# Patient Record
Sex: Male | Born: 1935
Health system: Southern US, Community
[De-identification: ages and names within clinical notes are randomized; demographics above are authoritative.]

## PROBLEM LIST (undated history)

## (undated) DIAGNOSIS — G609 Hereditary and idiopathic neuropathy, unspecified: Secondary | ICD-10-CM

## (undated) DIAGNOSIS — I350 Nonrheumatic aortic (valve) stenosis: Secondary | ICD-10-CM

## (undated) DIAGNOSIS — G629 Polyneuropathy, unspecified: Secondary | ICD-10-CM

## (undated) DIAGNOSIS — R011 Cardiac murmur, unspecified: Secondary | ICD-10-CM

## (undated) DIAGNOSIS — G43809 Other migraine, not intractable, without status migrainosus: Secondary | ICD-10-CM

## (undated) DIAGNOSIS — E790 Hyperuricemia without signs of inflammatory arthritis and tophaceous disease: Secondary | ICD-10-CM

## (undated) DIAGNOSIS — F419 Anxiety disorder, unspecified: Secondary | ICD-10-CM

## (undated) DIAGNOSIS — K529 Noninfective gastroenteritis and colitis, unspecified: Secondary | ICD-10-CM

## (undated) DIAGNOSIS — I10 Essential (primary) hypertension: Secondary | ICD-10-CM

## (undated) DIAGNOSIS — M1990 Unspecified osteoarthritis, unspecified site: Secondary | ICD-10-CM

## (undated) DIAGNOSIS — E785 Hyperlipidemia, unspecified: Secondary | ICD-10-CM

## (undated) DIAGNOSIS — I2510 Atherosclerotic heart disease of native coronary artery without angina pectoris: Secondary | ICD-10-CM

## (undated) DIAGNOSIS — R609 Edema, unspecified: Secondary | ICD-10-CM

## (undated) DIAGNOSIS — R5381 Other malaise: Secondary | ICD-10-CM

## (undated) DIAGNOSIS — M2550 Pain in unspecified joint: Secondary | ICD-10-CM

## (undated) DIAGNOSIS — C61 Malignant neoplasm of prostate: Secondary | ICD-10-CM

## (undated) DIAGNOSIS — I872 Venous insufficiency (chronic) (peripheral): Secondary | ICD-10-CM

## (undated) DIAGNOSIS — M542 Cervicalgia: Secondary | ICD-10-CM

## (undated) DIAGNOSIS — I272 Pulmonary hypertension, unspecified: Secondary | ICD-10-CM

## (undated) DIAGNOSIS — B029 Zoster without complications: Secondary | ICD-10-CM

## (undated) DIAGNOSIS — H6120 Impacted cerumen, unspecified ear: Secondary | ICD-10-CM

## (undated) DIAGNOSIS — R5383 Other fatigue: Secondary | ICD-10-CM

## (undated) DIAGNOSIS — I6529 Occlusion and stenosis of unspecified carotid artery: Secondary | ICD-10-CM

## (undated) DIAGNOSIS — M7020 Olecranon bursitis, unspecified elbow: Secondary | ICD-10-CM

## (undated) DIAGNOSIS — G4733 Obstructive sleep apnea (adult) (pediatric): Secondary | ICD-10-CM

## (undated) DIAGNOSIS — G43909 Migraine, unspecified, not intractable, without status migrainosus: Secondary | ICD-10-CM

## (undated) HISTORY — DX: Hereditary and idiopathic neuropathy, unspecified: G60.9

## (undated) HISTORY — DX: Cervicalgia: M54.2

## (undated) HISTORY — DX: Essential (primary) hypertension: I10

## (undated) HISTORY — DX: Polyneuropathy, unspecified: G62.9

## (undated) HISTORY — DX: Unspecified osteoarthritis, unspecified site: M19.90

## (undated) HISTORY — DX: Venous insufficiency (chronic) (peripheral): I87.2

## (undated) HISTORY — DX: Nonrheumatic aortic (valve) stenosis: I35.0

## (undated) HISTORY — DX: Olecranon bursitis, unspecified elbow: M70.20

## (undated) HISTORY — DX: Hyperuricemia without signs of inflammatory arthritis and tophaceous disease: E79.0

## (undated) HISTORY — DX: Migraine, unspecified, not intractable, without status migrainosus: G43.909

## (undated) HISTORY — DX: Cardiac murmur, unspecified: R01.1

## (undated) HISTORY — PX: APPENDECTOMY: SHX54

## (undated) HISTORY — DX: Noninfective gastroenteritis and colitis, unspecified: K52.9

## (undated) HISTORY — DX: Pulmonary hypertension, unspecified: I27.20

## (undated) HISTORY — DX: Pain in unspecified joint: M25.50

## (undated) HISTORY — DX: Other malaise: R53.81

## (undated) HISTORY — DX: Other fatigue: R53.83

## (undated) HISTORY — DX: Obstructive sleep apnea (adult) (pediatric): G47.33

## (undated) HISTORY — DX: Impacted cerumen, unspecified ear: H61.20

## (undated) HISTORY — DX: Anxiety disorder, unspecified: F41.9

## (undated) HISTORY — PX: ANGIOPLASTY: SHX39

## (undated) HISTORY — DX: Atherosclerotic heart disease of native coronary artery without angina pectoris: I25.10

## (undated) HISTORY — PX: HEMORRHOID SURGERY: SHX153

## (undated) HISTORY — DX: Zoster without complications: B02.9

## (undated) HISTORY — PX: TONSILLECTOMY: SUR1361

## (undated) HISTORY — PX: LIPOMA EXCISION: SHX5283

## (undated) HISTORY — DX: Hyperlipidemia, unspecified: E78.5

## (undated) HISTORY — DX: Edema, unspecified: R60.9

## (undated) HISTORY — PX: CATARACT EXTRACTION W/ INTRAOCULAR LENS IMPLANT: SHX1309

## (undated) HISTORY — DX: Occlusion and stenosis of unspecified carotid artery: I65.29

## (undated) HISTORY — DX: Other migraine, not intractable, without status migrainosus: G43.809

---

## 1998-02-07 ENCOUNTER — Other Ambulatory Visit: Admission: RE | Admit: 1998-02-07 | Discharge: 1998-02-07 | Payer: Self-pay

## 1998-02-08 ENCOUNTER — Observation Stay (HOSPITAL_COMMUNITY): Admission: AD | Admit: 1998-02-08 | Discharge: 1998-02-09 | Payer: Self-pay | Admitting: Cardiology

## 1998-02-13 ENCOUNTER — Observation Stay (HOSPITAL_COMMUNITY): Admission: AD | Admit: 1998-02-13 | Discharge: 1998-02-14 | Payer: Self-pay | Admitting: Cardiology

## 1999-01-15 ENCOUNTER — Other Ambulatory Visit: Admission: RE | Admit: 1999-01-15 | Discharge: 1999-01-15 | Payer: Self-pay | Admitting: Urology

## 1999-01-24 ENCOUNTER — Encounter: Admission: RE | Admit: 1999-01-24 | Discharge: 1999-04-24 | Payer: Self-pay | Admitting: Urology

## 1999-04-02 ENCOUNTER — Ambulatory Visit (HOSPITAL_BASED_OUTPATIENT_CLINIC_OR_DEPARTMENT_OTHER): Admission: RE | Admit: 1999-04-02 | Discharge: 1999-04-02 | Payer: Self-pay | Admitting: Urology

## 1999-04-02 ENCOUNTER — Encounter: Payer: Self-pay | Admitting: Urology

## 1999-04-26 ENCOUNTER — Encounter: Payer: Self-pay | Admitting: Radiation Oncology

## 1999-05-14 ENCOUNTER — Encounter: Admission: RE | Admit: 1999-05-14 | Discharge: 1999-08-12 | Payer: Self-pay | Admitting: Radiation Oncology

## 1999-07-12 ENCOUNTER — Encounter (INDEPENDENT_AMBULATORY_CARE_PROVIDER_SITE_OTHER): Payer: Self-pay | Admitting: Specialist

## 1999-07-12 ENCOUNTER — Other Ambulatory Visit: Admission: RE | Admit: 1999-07-12 | Discharge: 1999-07-12 | Payer: Self-pay | Admitting: Internal Medicine

## 2001-01-14 ENCOUNTER — Encounter: Payer: Self-pay | Admitting: Internal Medicine

## 2001-01-14 ENCOUNTER — Ambulatory Visit (HOSPITAL_COMMUNITY): Admission: RE | Admit: 2001-01-14 | Discharge: 2001-01-14 | Payer: Self-pay | Admitting: Internal Medicine

## 2001-09-07 ENCOUNTER — Encounter: Payer: Self-pay | Admitting: Neurology

## 2001-09-07 ENCOUNTER — Ambulatory Visit (HOSPITAL_COMMUNITY): Admission: RE | Admit: 2001-09-07 | Discharge: 2001-09-07 | Payer: Self-pay | Admitting: Neurology

## 2002-06-15 ENCOUNTER — Encounter: Payer: Self-pay | Admitting: Neurology

## 2002-06-15 ENCOUNTER — Encounter: Admission: RE | Admit: 2002-06-15 | Discharge: 2002-06-15 | Payer: Self-pay | Admitting: Neurology

## 2004-10-30 ENCOUNTER — Ambulatory Visit: Payer: Self-pay | Admitting: *Deleted

## 2004-11-07 ENCOUNTER — Ambulatory Visit: Payer: Self-pay | Admitting: Cardiology

## 2004-11-07 ENCOUNTER — Ambulatory Visit: Payer: Self-pay | Admitting: *Deleted

## 2004-11-13 ENCOUNTER — Ambulatory Visit: Payer: Self-pay

## 2004-12-03 ENCOUNTER — Ambulatory Visit: Payer: Self-pay | Admitting: *Deleted

## 2004-12-05 ENCOUNTER — Ambulatory Visit: Payer: Self-pay | Admitting: Internal Medicine

## 2005-03-04 ENCOUNTER — Ambulatory Visit: Payer: Self-pay | Admitting: *Deleted

## 2005-06-05 ENCOUNTER — Ambulatory Visit: Payer: Self-pay | Admitting: Internal Medicine

## 2005-08-06 ENCOUNTER — Ambulatory Visit: Payer: Self-pay

## 2005-08-06 ENCOUNTER — Ambulatory Visit: Payer: Self-pay | Admitting: Internal Medicine

## 2005-08-13 ENCOUNTER — Ambulatory Visit: Payer: Self-pay | Admitting: Internal Medicine

## 2005-08-19 ENCOUNTER — Ambulatory Visit: Payer: Self-pay | Admitting: *Deleted

## 2005-08-22 ENCOUNTER — Ambulatory Visit: Payer: Self-pay

## 2005-12-02 ENCOUNTER — Ambulatory Visit: Payer: Self-pay | Admitting: Internal Medicine

## 2005-12-17 ENCOUNTER — Ambulatory Visit: Payer: Self-pay | Admitting: *Deleted

## 2006-02-27 ENCOUNTER — Ambulatory Visit: Payer: Self-pay | Admitting: Cardiology

## 2006-03-16 ENCOUNTER — Ambulatory Visit: Payer: Self-pay

## 2006-03-16 ENCOUNTER — Ambulatory Visit: Payer: Self-pay | Admitting: *Deleted

## 2006-04-20 ENCOUNTER — Ambulatory Visit: Payer: Self-pay | Admitting: *Deleted

## 2006-05-05 ENCOUNTER — Ambulatory Visit: Payer: Self-pay | Admitting: *Deleted

## 2006-05-11 ENCOUNTER — Ambulatory Visit: Payer: Self-pay | Admitting: *Deleted

## 2006-06-01 ENCOUNTER — Ambulatory Visit: Payer: Self-pay | Admitting: Internal Medicine

## 2006-07-06 ENCOUNTER — Ambulatory Visit: Payer: Self-pay | Admitting: Internal Medicine

## 2006-08-24 ENCOUNTER — Ambulatory Visit: Payer: Self-pay | Admitting: Internal Medicine

## 2006-09-08 ENCOUNTER — Ambulatory Visit: Payer: Self-pay | Admitting: *Deleted

## 2006-09-15 ENCOUNTER — Ambulatory Visit: Payer: Self-pay

## 2006-09-15 ENCOUNTER — Encounter: Payer: Self-pay | Admitting: Cardiology

## 2006-09-16 ENCOUNTER — Ambulatory Visit: Payer: Self-pay | Admitting: *Deleted

## 2006-10-19 ENCOUNTER — Ambulatory Visit: Payer: Self-pay | Admitting: Internal Medicine

## 2006-10-29 ENCOUNTER — Ambulatory Visit: Payer: Self-pay | Admitting: Internal Medicine

## 2006-10-29 ENCOUNTER — Encounter (INDEPENDENT_AMBULATORY_CARE_PROVIDER_SITE_OTHER): Payer: Self-pay | Admitting: *Deleted

## 2006-12-22 ENCOUNTER — Ambulatory Visit: Payer: Self-pay

## 2006-12-22 ENCOUNTER — Ambulatory Visit: Payer: Self-pay | Admitting: *Deleted

## 2006-12-22 LAB — CONVERTED CEMR LAB
Alkaline Phosphatase: 20 units/L — ABNORMAL LOW (ref 39–117)
BUN: 27 mg/dL — ABNORMAL HIGH (ref 6–23)
Bilirubin, Direct: 0.1 mg/dL (ref 0.0–0.3)
CO2: 25 meq/L (ref 19–32)
GFR calc Af Amer: 58 mL/min
HDL: 59.7 mg/dL (ref >39.0)
Potassium: 4.9 meq/L (ref 3.5–5.1)
Sodium: 140 meq/L (ref 135–145)
Total CHOL/HDL Ratio: 3
Total Protein: 6.2 g/dL (ref 6.0–8.3)
Triglycerides: 57 mg/dL (ref 0–149)

## 2007-01-06 ENCOUNTER — Ambulatory Visit: Payer: Self-pay | Admitting: Internal Medicine

## 2007-01-10 ENCOUNTER — Encounter: Admission: RE | Admit: 2007-01-10 | Discharge: 2007-01-10 | Payer: Self-pay | Admitting: Internal Medicine

## 2007-01-26 ENCOUNTER — Ambulatory Visit: Payer: Self-pay | Admitting: *Deleted

## 2007-02-10 DIAGNOSIS — G43809 Other migraine, not intractable, without status migrainosus: Secondary | ICD-10-CM

## 2007-02-10 DIAGNOSIS — Z9089 Acquired absence of other organs: Secondary | ICD-10-CM

## 2007-02-10 DIAGNOSIS — G622 Polyneuropathy due to other toxic agents: Secondary | ICD-10-CM

## 2007-02-10 DIAGNOSIS — R011 Cardiac murmur, unspecified: Secondary | ICD-10-CM

## 2007-02-10 DIAGNOSIS — G619 Inflammatory polyneuropathy, unspecified: Secondary | ICD-10-CM | POA: Insufficient documentation

## 2007-02-10 DIAGNOSIS — M109 Gout, unspecified: Secondary | ICD-10-CM

## 2007-02-10 DIAGNOSIS — Z9861 Coronary angioplasty status: Secondary | ICD-10-CM

## 2007-02-10 HISTORY — DX: Other migraine, not intractable, without status migrainosus: G43.809

## 2007-02-17 ENCOUNTER — Encounter: Admission: RE | Admit: 2007-02-17 | Discharge: 2007-03-17 | Payer: Self-pay | Admitting: Neurology

## 2007-03-10 ENCOUNTER — Encounter: Payer: Self-pay | Admitting: Internal Medicine

## 2007-03-15 ENCOUNTER — Ambulatory Visit: Payer: Self-pay

## 2007-03-15 ENCOUNTER — Ambulatory Visit: Payer: Self-pay | Admitting: *Deleted

## 2007-03-15 ENCOUNTER — Encounter (INDEPENDENT_AMBULATORY_CARE_PROVIDER_SITE_OTHER): Payer: Self-pay | Admitting: *Deleted

## 2007-03-15 LAB — CONVERTED CEMR LAB
AST: 21 units/L (ref 0–37)
Bilirubin, Direct: 0.1 mg/dL (ref 0.0–0.3)
CO2: 29 meq/L (ref 19–32)
Chloride: 112 meq/L (ref 96–112)
Cholesterol: 181 mg/dL (ref 0–200)
Creatinine, Ser: 1.4 mg/dL (ref 0.4–1.5)
Glucose, Bld: 93 mg/dL (ref 70–99)
HDL: 55.4 mg/dL (ref >39.0)
Potassium: 4.6 meq/L (ref 3.5–5.1)
Sodium: 146 meq/L — ABNORMAL HIGH (ref 135–145)
Total Bilirubin: 1.2 mg/dL (ref 0.3–1.2)
Total Protein: 5.8 g/dL — ABNORMAL LOW (ref 6.0–8.3)
Triglycerides: 72 mg/dL (ref 0–149)
VLDL: 14 mg/dL (ref 0–40)

## 2007-03-19 ENCOUNTER — Ambulatory Visit: Payer: Self-pay | Admitting: Cardiovascular Disease

## 2007-04-20 ENCOUNTER — Ambulatory Visit: Payer: Self-pay | Admitting: Internal Medicine

## 2007-04-20 DIAGNOSIS — I872 Venous insufficiency (chronic) (peripheral): Secondary | ICD-10-CM | POA: Insufficient documentation

## 2007-04-20 DIAGNOSIS — R609 Edema, unspecified: Secondary | ICD-10-CM

## 2007-07-27 ENCOUNTER — Ambulatory Visit: Payer: Self-pay

## 2007-08-25 ENCOUNTER — Ambulatory Visit: Payer: Self-pay

## 2007-09-14 ENCOUNTER — Ambulatory Visit: Payer: Self-pay | Admitting: Internal Medicine

## 2007-09-14 DIAGNOSIS — M7020 Olecranon bursitis, unspecified elbow: Secondary | ICD-10-CM

## 2007-09-21 ENCOUNTER — Ambulatory Visit: Payer: Self-pay | Admitting: Cardiovascular Disease

## 2007-09-21 LAB — CONVERTED CEMR LAB
ALT: 28 units/L (ref 0–53)
AST: 21 units/L (ref 0–37)
Alkaline Phosphatase: 24 units/L — ABNORMAL LOW (ref 39–117)
Bilirubin, Direct: 0.2 mg/dL (ref 0.0–0.3)
Cholesterol: 161 mg/dL (ref 0–200)
HDL: 58 mg/dL (ref >39.0)
Total Protein: 6.3 g/dL (ref 6.0–8.3)

## 2007-11-16 ENCOUNTER — Telehealth: Payer: Self-pay | Admitting: Internal Medicine

## 2007-11-17 ENCOUNTER — Telehealth (INDEPENDENT_AMBULATORY_CARE_PROVIDER_SITE_OTHER): Payer: Self-pay | Admitting: *Deleted

## 2008-03-17 ENCOUNTER — Ambulatory Visit: Payer: Self-pay | Admitting: Cardiovascular Disease

## 2008-06-16 ENCOUNTER — Emergency Department (HOSPITAL_COMMUNITY): Admission: EM | Admit: 2008-06-16 | Discharge: 2008-06-16 | Payer: Self-pay | Admitting: Family Medicine

## 2008-09-07 ENCOUNTER — Ambulatory Visit: Payer: Self-pay

## 2008-09-13 ENCOUNTER — Ambulatory Visit: Payer: Self-pay | Admitting: Cardiovascular Disease

## 2008-09-15 ENCOUNTER — Ambulatory Visit: Payer: Self-pay | Admitting: Cardiovascular Disease

## 2008-09-15 LAB — CONVERTED CEMR LAB
ALT: 22 units/L (ref 0–53)
AST: 23 units/L (ref 0–37)
Bilirubin, Direct: 0.3 mg/dL (ref 0.0–0.3)
HDL: 53 mg/dL (ref >39.0)
Total Bilirubin: 1.4 mg/dL — ABNORMAL HIGH (ref 0.3–1.2)
Total CHOL/HDL Ratio: 2.9
VLDL: 10 mg/dL (ref 0–40)

## 2008-11-15 ENCOUNTER — Ambulatory Visit: Payer: Self-pay | Admitting: Internal Medicine

## 2008-11-15 DIAGNOSIS — E785 Hyperlipidemia, unspecified: Secondary | ICD-10-CM

## 2008-11-15 DIAGNOSIS — I10 Essential (primary) hypertension: Secondary | ICD-10-CM | POA: Insufficient documentation

## 2008-11-15 DIAGNOSIS — M2550 Pain in unspecified joint: Secondary | ICD-10-CM | POA: Insufficient documentation

## 2008-11-15 DIAGNOSIS — G609 Hereditary and idiopathic neuropathy, unspecified: Secondary | ICD-10-CM | POA: Insufficient documentation

## 2008-11-15 DIAGNOSIS — Z8719 Personal history of other diseases of the digestive system: Secondary | ICD-10-CM

## 2008-11-19 LAB — CONVERTED CEMR LAB
Sed Rate: 2 mm/hr (ref 0–16)
Uric Acid, Serum: 8.4 mg/dL — ABNORMAL HIGH (ref 4.0–7.8)

## 2008-11-21 ENCOUNTER — Telehealth (INDEPENDENT_AMBULATORY_CARE_PROVIDER_SITE_OTHER): Payer: Self-pay | Admitting: *Deleted

## 2008-11-21 ENCOUNTER — Encounter (INDEPENDENT_AMBULATORY_CARE_PROVIDER_SITE_OTHER): Payer: Self-pay | Admitting: *Deleted

## 2009-01-30 ENCOUNTER — Telehealth (INDEPENDENT_AMBULATORY_CARE_PROVIDER_SITE_OTHER): Payer: Self-pay | Admitting: *Deleted

## 2009-02-26 ENCOUNTER — Encounter: Payer: Self-pay | Admitting: Internal Medicine

## 2009-03-07 DIAGNOSIS — I359 Nonrheumatic aortic valve disorder, unspecified: Secondary | ICD-10-CM

## 2009-03-07 DIAGNOSIS — I2510 Atherosclerotic heart disease of native coronary artery without angina pectoris: Secondary | ICD-10-CM

## 2009-03-07 DIAGNOSIS — I6529 Occlusion and stenosis of unspecified carotid artery: Secondary | ICD-10-CM

## 2009-03-08 ENCOUNTER — Ambulatory Visit: Payer: Self-pay | Admitting: Cardiovascular Disease

## 2009-03-12 ENCOUNTER — Inpatient Hospital Stay (HOSPITAL_COMMUNITY): Admission: EM | Admit: 2009-03-12 | Discharge: 2009-03-13 | Payer: Self-pay | Admitting: Emergency Medicine

## 2009-03-12 ENCOUNTER — Ambulatory Visit: Payer: Self-pay | Admitting: Internal Medicine

## 2009-03-19 ENCOUNTER — Telehealth: Payer: Self-pay | Admitting: Cardiovascular Disease

## 2009-04-02 ENCOUNTER — Ambulatory Visit: Payer: Self-pay

## 2009-04-02 ENCOUNTER — Encounter: Payer: Self-pay | Admitting: Cardiovascular Disease

## 2009-05-23 ENCOUNTER — Encounter: Payer: Self-pay | Admitting: Internal Medicine

## 2009-06-18 ENCOUNTER — Ambulatory Visit: Payer: Self-pay | Admitting: Internal Medicine

## 2009-06-18 DIAGNOSIS — H6120 Impacted cerumen, unspecified ear: Secondary | ICD-10-CM

## 2009-06-18 DIAGNOSIS — H9190 Unspecified hearing loss, unspecified ear: Secondary | ICD-10-CM | POA: Insufficient documentation

## 2009-08-21 ENCOUNTER — Encounter: Payer: Self-pay | Admitting: Internal Medicine

## 2009-08-23 ENCOUNTER — Telehealth: Payer: Self-pay | Admitting: Cardiovascular Disease

## 2009-09-05 ENCOUNTER — Telehealth: Payer: Self-pay | Admitting: Cardiovascular Disease

## 2009-09-07 ENCOUNTER — Encounter (INDEPENDENT_AMBULATORY_CARE_PROVIDER_SITE_OTHER): Payer: Self-pay | Admitting: *Deleted

## 2009-09-07 ENCOUNTER — Encounter: Payer: Self-pay | Admitting: Cardiovascular Disease

## 2009-09-10 ENCOUNTER — Ambulatory Visit: Payer: Self-pay

## 2009-09-10 ENCOUNTER — Encounter: Payer: Self-pay | Admitting: Internal Medicine

## 2009-09-10 ENCOUNTER — Ambulatory Visit: Payer: Self-pay | Admitting: Cardiovascular Disease

## 2009-11-09 ENCOUNTER — Ambulatory Visit: Payer: Self-pay | Admitting: Internal Medicine

## 2009-11-09 DIAGNOSIS — F411 Generalized anxiety disorder: Secondary | ICD-10-CM | POA: Insufficient documentation

## 2009-11-15 ENCOUNTER — Ambulatory Visit: Payer: Self-pay | Admitting: Internal Medicine

## 2009-11-20 LAB — CONVERTED CEMR LAB
BUN: 27 mg/dL — ABNORMAL HIGH (ref 6–23)
Creatinine, Ser: 1.3 mg/dL (ref 0.4–1.5)
Potassium: 4.4 meq/L (ref 3.5–5.1)
Sed Rate: 10 mm/hr (ref 0–22)

## 2009-11-28 ENCOUNTER — Ambulatory Visit: Payer: Self-pay | Admitting: Internal Medicine

## 2009-11-28 DIAGNOSIS — E790 Hyperuricemia without signs of inflammatory arthritis and tophaceous disease: Secondary | ICD-10-CM | POA: Insufficient documentation

## 2010-01-01 ENCOUNTER — Telehealth (INDEPENDENT_AMBULATORY_CARE_PROVIDER_SITE_OTHER): Payer: Self-pay | Admitting: *Deleted

## 2010-01-09 ENCOUNTER — Telehealth: Payer: Self-pay | Admitting: Internal Medicine

## 2010-01-17 ENCOUNTER — Encounter: Payer: Self-pay | Admitting: Internal Medicine

## 2010-03-14 ENCOUNTER — Ambulatory Visit: Payer: Self-pay | Admitting: Cardiovascular Disease

## 2010-03-20 ENCOUNTER — Telehealth (INDEPENDENT_AMBULATORY_CARE_PROVIDER_SITE_OTHER): Payer: Self-pay | Admitting: *Deleted

## 2010-03-21 ENCOUNTER — Encounter: Payer: Self-pay | Admitting: Cardiovascular Disease

## 2010-03-21 ENCOUNTER — Ambulatory Visit: Payer: Self-pay | Admitting: Cardiovascular Disease

## 2010-03-21 ENCOUNTER — Encounter (HOSPITAL_COMMUNITY): Admission: RE | Admit: 2010-03-21 | Discharge: 2010-03-21 | Payer: Self-pay | Admitting: Cardiovascular Disease

## 2010-03-21 ENCOUNTER — Ambulatory Visit: Payer: Self-pay

## 2010-03-21 DIAGNOSIS — R0989 Other specified symptoms and signs involving the circulatory and respiratory systems: Secondary | ICD-10-CM

## 2010-03-21 DIAGNOSIS — R0609 Other forms of dyspnea: Secondary | ICD-10-CM

## 2010-03-27 ENCOUNTER — Telehealth: Payer: Self-pay | Admitting: Internal Medicine

## 2010-03-28 LAB — CONVERTED CEMR LAB
AST: 22 units/L (ref 0–37)
Albumin: 4.2 g/dL (ref 3.5–5.2)
Alkaline Phosphatase: 25 units/L — ABNORMAL LOW (ref 39–117)
Cholesterol: 190 mg/dL (ref 0–200)
Total Protein: 6 g/dL (ref 6.0–8.3)
VLDL: 7.4 mg/dL (ref 0.0–40.0)

## 2010-06-04 ENCOUNTER — Ambulatory Visit: Payer: Self-pay | Admitting: Internal Medicine

## 2010-06-04 DIAGNOSIS — IMO0002 Reserved for concepts with insufficient information to code with codable children: Secondary | ICD-10-CM

## 2010-07-01 ENCOUNTER — Ambulatory Visit: Payer: Self-pay | Admitting: Cardiovascular Disease

## 2010-07-02 ENCOUNTER — Encounter: Payer: Self-pay | Admitting: Cardiovascular Disease

## 2010-07-02 LAB — CONVERTED CEMR LAB
Albumin: 3.7 g/dL (ref 3.5–5.2)
Cholesterol: 166 mg/dL (ref 0–200)
HDL: 86.2 mg/dL (ref >39.00)
LDL Cholesterol: 74 mg/dL (ref 0–99)
Total CHOL/HDL Ratio: 2
Total Protein: 5.5 g/dL — ABNORMAL LOW (ref 6.0–8.3)
Triglycerides: 31 mg/dL (ref 0.0–149.0)
VLDL: 6.2 mg/dL (ref 0.0–40.0)

## 2010-09-11 ENCOUNTER — Encounter: Payer: Self-pay | Admitting: Cardiovascular Disease

## 2010-09-12 ENCOUNTER — Ambulatory Visit: Payer: Self-pay

## 2010-09-12 ENCOUNTER — Encounter: Payer: Self-pay | Admitting: Cardiovascular Disease

## 2010-09-16 ENCOUNTER — Encounter: Payer: Self-pay | Admitting: Cardiovascular Disease

## 2010-09-16 ENCOUNTER — Ambulatory Visit: Payer: Self-pay | Admitting: Cardiovascular Disease

## 2010-09-16 DIAGNOSIS — I2789 Other specified pulmonary heart diseases: Secondary | ICD-10-CM | POA: Insufficient documentation

## 2010-10-10 ENCOUNTER — Encounter: Payer: Self-pay | Admitting: Cardiovascular Disease

## 2010-10-10 ENCOUNTER — Ambulatory Visit (HOSPITAL_COMMUNITY)
Admission: RE | Admit: 2010-10-10 | Discharge: 2010-10-10 | Payer: Self-pay | Source: Home / Self Care | Attending: Cardiovascular Disease | Admitting: Cardiovascular Disease

## 2010-10-10 ENCOUNTER — Ambulatory Visit: Admission: RE | Admit: 2010-10-10 | Discharge: 2010-10-10 | Payer: Self-pay | Source: Home / Self Care

## 2010-10-16 ENCOUNTER — Encounter: Payer: Self-pay | Admitting: Pulmonary Disease

## 2010-10-16 ENCOUNTER — Ambulatory Visit (HOSPITAL_BASED_OUTPATIENT_CLINIC_OR_DEPARTMENT_OTHER)
Admission: RE | Admit: 2010-10-16 | Discharge: 2010-10-16 | Payer: Self-pay | Source: Home / Self Care | Attending: Cardiovascular Disease | Admitting: Cardiovascular Disease

## 2010-10-22 ENCOUNTER — Ambulatory Visit (HOSPITAL_COMMUNITY)
Admission: RE | Admit: 2010-10-22 | Discharge: 2010-10-22 | Payer: Self-pay | Source: Home / Self Care | Attending: Internal Medicine | Admitting: Internal Medicine

## 2010-10-22 ENCOUNTER — Ambulatory Visit: Admission: RE | Admit: 2010-10-22 | Discharge: 2010-10-22 | Payer: Self-pay | Source: Home / Self Care

## 2010-10-22 ENCOUNTER — Encounter: Payer: Self-pay | Admitting: Internal Medicine

## 2010-11-05 NOTE — Assessment & Plan Note (Addendum)
Summary: LEFT LEG GOUT ?/RH......   Vital Signs:  Patient profile:   77 year old male Weight:      190.8 pounds Temp:     97.9 degrees F oral Pulse rate:   64 / minute Resp:     16 per minute BP sitting:   130 / 70  (left arm) Cuff size:   large  Vitals Entered By: Shonna Chock CMA (June 04, 2010 10:43 AM) CC: 1.) Left leg gout- patient went to the [place] and Colchicine was changed to allpurinolol and it is not helping  2.) Discuss supplements for Gout  3.) Look at right leg-cut that will not heal   Primary Care Provider:  Marga Melnick MD  CC:  1.) Left leg gout- patient went to the [place] and Colchicine was changed to allpurinolol and it is not helping  2.) Discuss supplements for Gout  3.) Look at right leg-cut that will not heal.  History of Present Illness: The [facility] changed Colchicine to Allopurinol & his gout has flared in L knee & L foot.Potential supplements reviewed; role of High Fructose Corn Syrup  in raising uric acid discussed. Abrasion R shin 10 days ago is slowly healing.  Current Medications (verified): 1)  Allopurinol 300 Mg Tabs (Allopurinol) .Marland Kitchen.. 1 By Mouth Once Daily 2)  Asacol 400 Mg  Tbec (Mesalamine) .... 2 Tabs Tid 3)  Metoprolol Tartrate 50 Mg  Tabs (Metoprolol Tartrate) .... 1/2 Tab Bid 4)  Hytrin 5 Mg  Caps (Terazosin Hcl) .Marland Kitchen.. 1 By Mouth Qd 5)  Lisinopril 40 Mg Tabs (Lisinopril) .... 1/2 Tab By Mouth Two Times A Day 6)  Gabapentin 300 Mg  Caps (Gabapentin) .... Take 3 Tabs 5 Times Daily 7)  Pravastatin Sodium 80 Mg Tabs (Pravastatin Sodium) .... Take One Tablet By Mouth Daily At Bedtime 8)  Furosemide 40 Mg  Tabs (Furosemide) .Marland Kitchen.. 1 By Mouth Qd 9)  Aspirin 81 Mg Tbec (Aspirin) .... Take One Tablet By Mouth Daily 10)  Aleve 220 Mg Tabs (Naproxen Sodium) .... Take 1 Tablet By Mouth Two Times A Day 11)  Spironolactone 25 Mg Tabs (Spironolactone) .Marland Kitchen.. 1  Once Daily 12)  Tramadol Hcl 50 Mg Tabs (Tramadol Hcl) .Marland Kitchen.. 1 Q 6 Hrs As Needed Pain  Allergies  (verified): No Known Drug Allergies  Physical Exam  General:  in no acute distress; alert,appropriate and cooperative throughout examination Pulses:  R and L dorsalis pedis and posterior tibial pulses are full and equal bilaterally Extremities:  trace left pedal edema and 1/2+ right pedal edema.  Tender dorsum L foot with mild erythema  Skin:  30X 12 mm abrasion R shin w/o purulence   Impression & Recommendations:  Problem # 1:  GOUT (ICD-274.9)  His updated medication list for this problem includes:    Allopurinol 300 Mg Tabs (Allopurinol) .Marland Kitchen... 1 by mouth once daily    Colcrys 0.6 Mg Tabs (Colchicine) .Marland Kitchen... 1 two times a day  Problem # 2:  ABRASION, LEG (ICD-916.0) w/o infection  Complete Medication List: 1)  Allopurinol 300 Mg Tabs (Allopurinol) .Marland Kitchen.. 1 by mouth once daily 2)  Asacol 400 Mg Tbec (Mesalamine) .... 2 tabs tid 3)  Metoprolol Tartrate 50 Mg Tabs (Metoprolol tartrate) .... 1/2 tab bid 4)  Hytrin 5 Mg Caps (Terazosin hcl) .Marland Kitchen.. 1 by mouth qd 5)  Lisinopril 40 Mg Tabs (Lisinopril) .... 1/2 tab by mouth two times a day 6)  Gabapentin 300 Mg Caps (Gabapentin) .... Take 3 tabs 5 times daily 7)  Pravastatin Sodium 80 Mg Tabs (Pravastatin sodium) .... Take one tablet by mouth daily at bedtime 8)  Furosemide 40 Mg Tabs (Furosemide) .Marland Kitchen.. 1 by mouth qd 9)  Aspirin 81 Mg Tbec (Aspirin) .... Take one tablet by mouth daily 10)  Aleve 220 Mg Tabs (Naproxen sodium) .... Take 1 tablet by mouth two times a day 11)  Spironolactone 25 Mg Tabs (Spironolactone) .Marland Kitchen.. 1  once daily 12)  Tramadol Hcl 50 Mg Tabs (Tramadol hcl) .Marland Kitchen.. 1 q 6 hrs as needed pain 13)  Colcrys 0.6 Mg Tabs (Colchicine) .Marland Kitchen.. 1 two times a day 14)  Bactroban 2 % Crea (Mupirocin calcium) .... Apply once-2x  daily if pus or redness present  Patient Instructions: 1)  Hold Pravastatin & Allopurinol  for 2 weeks while on  Colcrys . 2)  Please schedule a follow-up appointment in 2 weeks. Consume < 40 grams of High  Fructose Corn Syrup  sugar/ day. Wet to dry saline dressing two times a day to wound. Use Telfa dressing. Prescriptions: BACTROBAN 2 % CREA (MUPIROCIN CALCIUM) apply once-2X  daily if pus or redness present  #15 grams x 0   Entered and Authorized by:   Marga Melnick MD   Signed by:   Marga Melnick MD on 06/04/2010   Method used:   Print then Give to Patient   RxID:   [id number] COLCRYS 0.6 MG TABS (COLCHICINE) 1 two times a day  #30 x 0   Entered and Authorized by:   Marga Melnick MD   Signed by:   Marga Melnick MD on 06/04/2010   Method used:   Print then Give to Patient   RxID:   [id number]

## 2010-11-05 NOTE — Assessment & Plan Note (Addendum)
Summary: discuss personal issue//fd   Vital Signs:  Patient profile:   77 year old male Temp:     97.9 degrees F oral Pulse rate:   72 / minute BP sitting:   136 / 80  (left arm) Cuff size:   large  Vitals Entered By: Shonna Chock (November 09, 2009 12:05 PM) CC: Discuss Personal Issues: Consult about his wife Comments REVIEWED MED LIST, PATIENT AGREED DOSE AND INSTRUCTION CORRECT    Primary Care Provider:  Marga Melnick MD  CC:  Discuss Personal Issues: Consult about his wife.  History of Present Illness: He is here to discuss his wife's sleep disorder ; he feels she needs 10 mg of Ambien at bedtime  based on "a friend's recommendation". Options discussed : consult Dr Marylou Flesher; discuss with their daughter  or go to friend's for Ambien 10 mg Rx.  Allergies (verified): No Known Drug Allergies   Impression & Recommendations:  Problem # 1:  ANXIETY (ICD-300.00)  concerned re wife's condition  Orders: No Charge Patient Arrived (NCPA0) (NCPA0)  Complete Medication List: 1)  Colchicine 0.6 Mg Tabs (Colchicine) .Marland Kitchen.. 1 by mouth once daily 2)  Asacol 400 Mg Tbec (Mesalamine) .... 2 tabs tid 3)  Metoprolol Tartrate 50 Mg Tabs (Metoprolol tartrate) .... 1/2 tab bid 4)  Hytrin 5 Mg Caps (Terazosin hcl) .Marland Kitchen.. 1 by mouth qd 5)  Lisinopril 20 Mg Tabs (Lisinopril) .Marland Kitchen.. 1 by mouth bid 6)  Gabapentin 300 Mg Caps (Gabapentin) .... Take 3 three times a day 7)  Pravastatin Sodium 40 Mg Tabs (Pravastatin sodium) .Marland Kitchen.. 1 by mouth qd 8)  Furosemide 40 Mg Tabs (Furosemide) .Marland Kitchen.. 1 by mouth qd 9)  Aspirin 81 Mg Tbec (Aspirin) .... Take one tablet by mouth daily 10)  Aleve 220 Mg Tabs (Naproxen sodium) .... Take 1 tablet by mouth two times a day 11)  Hydrochlorothiazide 25 Mg Tabs (Hydrochlorothiazide) .... 1/2 by mouth once daily (added by va dr.)  Patient Instructions: 1)  Consider all options.

## 2010-11-05 NOTE — Consult Note (Addendum)
Summary: [facility]   Imported By: Lanelle Bal 02/04/2010 12:13:08  _____________________________________________________________________  External Attachment:    Type:   Image     Comment:   External Document

## 2010-11-05 NOTE — Progress Notes (Addendum)
Summary: Nuclear Pre-Procedure  Phone Note Outgoing Call Call back at [facility] Phone [phone]   Call placed by: Stanton Kidney, EMT-P,  March 20, 2010 2:20 PM Call placed to: Patient Action Taken: Phone Call Completed Summary of Call: Reviewed information on Myoview Information Sheet (see scanned document for further details).  Spoke with Patient.    Nuclear Med Background Indications for Stress Test: Evaluation for Ischemia, Stent Patency, PTCA Patency   History: Angioplasty, Echo, Heart Catheterization, Myocardial Perfusion Study, Stents  History Comments: '99 Heart Cath: Angioplasty/Stents: CFX, RCA '08 MPS: EF=62%, NL 6/10 Echo: EF=55-60%, mild AS  Symptoms: DOE    Nuclear Pre-Procedure Cardiac Risk Factors: Carotid Disease, Hypertension, Lipids Height (in): 67

## 2010-11-05 NOTE — Progress Notes (Addendum)
Summary: Referral Request  Phone Note Call from Patient Call back at Home Phone [phone]   Caller: Patient Summary of Call: Message left on VM: Patient would like a referral to Rheumatologist for arthritis  Dr.Hopper do you agree with patient's request for referral? Paul Mays  January 09, 2010 1:23 PM

## 2010-11-05 NOTE — Assessment & Plan Note (Addendum)
Summary: Cardiology Nuclear Study  Nuclear Med Background Indications for Stress Test: Evaluation for Ischemia, Stent Patency, PTCA Patency   History: Angioplasty, Echo, Heart Catheterization, Myocardial Perfusion Study, Stents  History Comments: '99 Heart Cath: Angioplasty/Stents: CFX, RCA '08 MPS: EF=62%, NL 6/10 Echo: EF=55-60%, mild AS  Symptoms: DOE, Fatigue    Nuclear Pre-Procedure Cardiac Risk Factors: Carotid Disease, Hypertension, Lipids Caffeine/Decaff Intake: None NPO After: 7:00 PM Lungs: clear IV 0.9% NS with Angio Cath: 20g     IV Site: (R) AC IV Started by: Stanton Kidney EMT-P Chest Size (in) 46     Height (in): 67 Weight (lb): 191 BMI: 30.02  Nuclear Med Study 1 or 2 day study:  1 day     Stress Test Type:  Eugenie Birks Reading MD:  Charlton Haws, MD     Referring MD:  M.Cooper Resting Radionuclide:  Technetium 60m Tetrofosmin     Resting Radionuclide Dose:  11 mCi  Stress Radionuclide:  Technetium 63m Tetrofosmin     Stress Radionuclide Dose:  33 mCi   Stress Protocol   Lexiscan: 0.4 mg   Stress Test Technologist:  Frederick Peers EMT-P     Nuclear Technologist:  Domenic Polite CNMT  Rest Procedure  Myocardial perfusion imaging was performed at rest 45 minutes following the intravenous administration of Myoview Technetium 68m Tetrofosmin.  Stress Procedure  The patient received IV Lexiscan 0.4 mg over 15-seconds.  Myoview injected at 30-seconds.  There were no significant changes with infusion.  Quantitative spect images were obtained after a 45 minute delay.  QPS Raw Data Images:  Normal; no motion artifact; normal heart/lung ratio. Stress Images:  NI: Uniform and normal uptake of tracer in all myocardial segments. Rest Images:  Normal homogeneous uptake in all areas of the myocardium. Subtraction (SDS):  Normal Transient Ischemic Dilatation:  1.17  (Normal <1.22)  Lung/Heart Ratio:  .3  (Normal <0.45)  Quantitative Gated Spect Images QGS EDV:  137  ml QGS ESV:  59 ml QGS EF:  57 % QGS cine images:  normal  Findings Normal nuclear study      Overall Impression  Exercise Capacity: Lexiscan BP Response: Normal blood pressure response. Clinical Symptoms: light headed ECG Impression: No significant ST segment change suggestive of ischemia. Overall Impression: Normal stress nuclear study.  Appended Document: Cardiology Nuclear Study please notify pt of normal result, thx.  Appended Document: Cardiology Nuclear Study Pt aware of results by phone.

## 2010-11-05 NOTE — Assessment & Plan Note (Addendum)
Summary: FOR ARTHITIS PAIN//PH   Vital Signs:  Patient profile:   77 year old male Weight:      196.4 pounds Temp:     98.1 degrees F oral Pulse rate:   60 / minute Resp:     15 per minute BP sitting:   158 / 80  (left arm) Cuff size:   large  Vitals Entered By: Shonna Chock (November 15, 2009 3:34 PM) CC: Arthritis, Hypertension Management Comments REVIEWED MED LIST, PATIENT AGREED DOSE AND INSTRUCTION CORRECT    Primary Care Provider:  Marga Melnick MD  CC:  Arthritis and Hypertension Management.  History of Present Illness: Arthritis in hands , knees , feet, & LS area. Rx: Aleve two times a day helps. No FH of arthritis. PMH of gout . HCTZ Rxed @ [facility] 03/2009.  Hypertension History:      He complains of peripheral edema, but denies headache, chest pain, palpitations, dyspnea with exertion, orthopnea, PND, visual symptoms, neurologic problems, syncope, and side effects from treatment.  He notes no problems with any antihypertensive medication side effects.        Positive major cardiovascular risk factors include male age 54 years old or older, hyperlipidemia, and hypertension.        Positive history for target organ damage include ASHD (either angina/prior MI/prior CABG).     Allergies (verified): No Known Drug Allergies  Review of Systems Eyes:  Denies blurring, double vision, and vision loss-both eyes. CV:  Denies leg cramps with exertion, lightheadness, and near fainting. MS:  Complains of joint pain, joint swelling, and low back pain; denies joint redness. Neuro:  Denies numbness and tingling; BPV.  Physical Exam  General:  in no acute distress; alert,appropriate and cooperative throughout examination Neck:  R > L carotid bruits Lungs:  Normal respiratory effort, chest expands symmetrically. Lungs are clear to auscultation, no crackles or wheezes. Heart:  normal rate, regular rhythm, no gallop, no rub, no JVD, no HJR, and grade 1 /6 systolic murmur.   Abdomen:   Bowel sounds positive,abdomen soft and non-tender without masses, organomegaly or hernias noted. No AAA or bruits Pulses:  R and L carotid,radial,dorsalis pedis and posterior tibial pulses are full and equal bilaterally Extremities:  trace left pedal edema and 1+ right pedal edema. Mild hand changes ; crepitus of knees . Osteoma @ R elbow. Skin:  Intact without suspicious lesions or rashes Psych:  memory intact for recent and remote, normally interactive, and good eye contact.     Impression & Recommendations:  Problem # 1:  ARTHRALGIA (ICD-719.40)  Orders: Venipuncture (67893) TLB-Uric Acid, Blood (84550-URIC) TLB-Rheumatoid Factor (RA) (81017-PZ) TLB-Sedimentation Rate (ESR) (85652-ESR) Prescription Created Electronically [phone])  Problem # 2:  HYPERTENSION, ESSENTIAL NOS (ICD-401.9)  The following medications were removed from the medication list:    Hydrochlorothiazide 25 Mg Tabs (Hydrochlorothiazide) .Marland Kitchen... 1/2 by mouth once daily (added by va dr.) His updated medication list for this problem includes:    Metoprolol Tartrate 50 Mg Tabs (Metoprolol tartrate) .Marland Kitchen... 1/2 tab bid    Hytrin 5 Mg Caps (Terazosin hcl) .Marland Kitchen... 1 by mouth qd    Lisinopril 20 Mg Tabs (Lisinopril) .Marland Kitchen... 1 by mouth bid    Furosemide 40 Mg Tabs (Furosemide) .Marland Kitchen... 1 by mouth qd    Spironolactone 25 Mg Tabs (Spironolactone) .Marland Kitchen... 1 two times a day  Orders: Venipuncture (27782) TLB-Creatinine, Blood (82565-CREA) TLB-Potassium (K+) (84132-K) TLB-BUN (Urea Nitrogen) (84520-BUN)  Problem # 3:  GOUT (ICD-274.9)  PMH of His updated medication  list for this problem includes:    Colchicine 0.6 Mg Tabs (Colchicine) .Marland Kitchen... 1 by mouth once daily  Orders: Venipuncture (16109)  Complete Medication List: 1)  Colchicine 0.6 Mg Tabs (Colchicine) .Marland Kitchen.. 1 by mouth once daily 2)  Asacol 400 Mg Tbec (Mesalamine) .... 2 tabs tid 3)  Metoprolol Tartrate 50 Mg Tabs (Metoprolol tartrate) .... 1/2 tab bid 4)  Hytrin 5 Mg Caps  (Terazosin hcl) .Marland Kitchen.. 1 by mouth qd 5)  Lisinopril 20 Mg Tabs (Lisinopril) .Marland Kitchen.. 1 by mouth bid 6)  Gabapentin 300 Mg Caps (Gabapentin) .... Take 3 three times a day 7)  Pravastatin Sodium 40 Mg Tabs (Pravastatin sodium) .Marland Kitchen.. 1 by mouth qd 8)  Furosemide 40 Mg Tabs (Furosemide) .Marland Kitchen.. 1 by mouth qd 9)  Aspirin 81 Mg Tbec (Aspirin) .... Take one tablet by mouth daily 10)  Aleve 220 Mg Tabs (Naproxen sodium) .... Take 1 tablet by mouth two times a day 11)  Spironolactone 25 Mg Tabs (Spironolactone) .Marland Kitchen.. 1 two times a day 12)  Tramadol Hcl 50 Mg Tabs (Tramadol hcl) .Marland Kitchen.. 1 q 6 hrs as needed pain  Hypertension Assessment/Plan:      The patient's hypertensive risk group is category C: Target organ damage and/or diabetes.  Today's blood pressure is 158/80.    Patient Instructions: 1)  Stop HCTZ. Increase Colchicine to two times a day X 1 week. Prescriptions: TRAMADOL HCL 50 MG TABS (TRAMADOL HCL) 1 q 6 hrs as needed pain  #30 x 1   Entered and Authorized by:   Marga Melnick MD   Signed by:   Marga Melnick MD on 11/15/2009   Method used:   Faxed to ...       [facility] [address] [phone]* (retail)       [address]       [place], [place]  [address]       Ph: [phone] or [phone]       Fax: [phone]   RxID:   [id number] SPIRONOLACTONE 25 MG TABS (SPIRONOLACTONE) 1 two times a day  #60 x 0   Entered and Authorized by:   Marga Melnick MD   Signed by:   Marga Melnick MD on 11/15/2009   Method used:   Print then Give to Patient   RxID:   [id number]

## 2010-11-05 NOTE — Progress Notes (Addendum)
Summary: Neuropathy treatment  Phone Note Call from Patient   Caller: Patient Summary of Call: Pt called and stated that he has neuropathy(Dr.Hopper is aware). He would like to go to a place in [place] where they will evaluate and treat. He states that they need a fax stating that he does have Neuropathy and they can do the treatment on him. Please send fax to- [phone]. Army Fossa CMA  March 28, 2010 2:20 PM   Follow-up for Phone Call        FAX 02/26/2009 Neurology evaluation Follow-up by: Marga Melnick MD,  March 27, 2010 4:30 PM  Additional Follow-up for Phone Call Additional follow up Details #1::        Information was faxed. Additional Follow-up by: Harold Barban,  March 28, 2010 2:28 PM

## 2010-11-05 NOTE — Assessment & Plan Note (Addendum)
Summary: Follow-up on labs/scm   Vital Signs:  Patient profile:   77 year old male Weight:      191 pounds Pulse rate:   72 / minute Resp:     15 per minute BP sitting:   104 / 60  (left arm) Cuff size:   large  Vitals Entered By: Shonna Chock (November 28, 2009 12:28 PM) CC: Follow-up visit: discuss labs  Comments REVIEWED MED LIST, PATIENT AGREED DOSE AND INSTRUCTION CORRECT    Primary Care Provider:  Marga Melnick MD  CC:  Follow-up visit: discuss labs .  History of Present Illness: Labs reviewed & goals discussed; uric acid was 9.4. He continues to have stiffness in knees, hands & feet; Aleve helps.See BP ; off HCTZ  since 02/10 . Now on Spironolactone 25 mg two times a day   Allergies (verified): No Known Drug Allergies  Review of Systems General:  Denies chills, fever, and sweats. MS:  Complains of joint pain and stiffness; denies joint redness and joint swelling.  Physical Exam  General:  Appears younger tahn age,in no acute distress; alert,appropriate and cooperative throughout examination Extremities:  No clubbing, cyanosis, edema. Minor OA DIP  changes  noted with normal full range of motion of all joints.  Crepitus @ knees Neurologic:  alert & oriented X3.     Impression & Recommendations:  Problem # 1:  HYPERTENSION, ESSENTIAL NOS (ICD-401.9) controlled His updated medication list for this problem includes:    Metoprolol Tartrate 50 Mg Tabs (Metoprolol tartrate) .Marland Kitchen... 1/2 tab bid    Hytrin 5 Mg Caps (Terazosin hcl) .Marland Kitchen... 1 by mouth qd    Lisinopril 20 Mg Tabs (Lisinopril) .Marland Kitchen... 1 by mouth bid    Furosemide 40 Mg Tabs (Furosemide) .Marland Kitchen... 1 by mouth qd    Spironolactone 25 Mg Tabs (Spironolactone) .Marland Kitchen... 1  once daily  Problem # 2:  ARTHRALGIA (ICD-719.40) Stiffness  Problem # 3:  HYPERURICEMIA (ICD-790.6)  Complete Medication List: 1)  Colchicine 0.6 Mg Tabs (Colchicine) .... 2 by mouth once daily 2)  Asacol 400 Mg Tbec (Mesalamine) .... 2 tabs  tid 3)  Metoprolol Tartrate 50 Mg Tabs (Metoprolol tartrate) .... 1/2 tab bid 4)  Hytrin 5 Mg Caps (Terazosin hcl) .Marland Kitchen.. 1 by mouth qd 5)  Lisinopril 20 Mg Tabs (Lisinopril) .Marland Kitchen.. 1 by mouth bid 6)  Gabapentin 300 Mg Caps (Gabapentin) .... Take 3 three times a day 7)  Pravastatin Sodium 40 Mg Tabs (Pravastatin sodium) .Marland Kitchen.. 1 by mouth qd 8)  Furosemide 40 Mg Tabs (Furosemide) .Marland Kitchen.. 1 by mouth qd 9)  Aspirin 81 Mg Tbec (Aspirin) .... Take one tablet by mouth daily 10)  Aleve 220 Mg Tabs (Naproxen sodium) .... Take 1 tablet by mouth two times a day 11)  Spironolactone 25 Mg Tabs (Spironolactone) .Marland Kitchen.. 1  once daily 12)  Tramadol Hcl 50 Mg Tabs (Tramadol hcl) .Marland Kitchen.. 1 q 6 hrs as needed pain  Patient Instructions: 1)  Consume LESS THAN 40 grams of sugar/ day from foods & drinks with High Fructose Corn Syrup  as #1,2 or #3 on label. 2)  Please schedule a follow-up Lab  appointment in 2 months for uric acid.

## 2010-11-05 NOTE — Miscellaneous (Addendum)
Summary: Orders Update  Clinical Lists Changes  Orders: Added new Test order of Carotid Duplex (Carotid Duplex) - Signed 

## 2010-11-05 NOTE — Progress Notes (Addendum)
Summary: Refill Meds   Phone Note Refill Request Call back at Home Phone [phone] Message from:  Patient  Refills Requested: Medication #1:  SPIRONOLACTONE 25 MG TABS 1  once daily  Method Requested: Fax to Local Pharmacy Initial call taken by: Shonna Chock,  January 01, 2010 2:44 PM    Prescriptions: SPIRONOLACTONE 25 MG TABS (SPIRONOLACTONE) 1  once daily  #90 x 1   Entered by:   Shonna Chock   Authorized by:   Marga Melnick MD   Signed by:   Shonna Chock on 01/01/2010   Method used:   Electronically to        [facility]  [phone]* (retail)       [address]       [place], [place]  [address]       Ph: [phone] or [phone]       Fax: [phone]   RxID:   [id number]

## 2010-11-05 NOTE — Assessment & Plan Note (Addendum)
Summary: f15m   Visit Type:  Follow-up Primary Provider:  Marga Melnick MD  CC:  shortness of breath.  History of Present Illness: This is a 77 year old gentleman with history of coronary artery disease status post percutaneous coronary intervention in the 67s. He underwent stenting of the left circumflex and right coronary artery. He presents today for followup evaluation.  He complains of exertional dyspnea, occurs with low-level activity. No orthopnea, PND, or edema. Denies chest pain. Has problems with neuropathy and a lot of leg pain related to that.  No cough, fever, chills, or wheezing.  Current Medications (verified): 1)  Colchicine 0.6 Mg  Tabs (Colchicine) .... 2 By Mouth Once Daily 2)  Asacol 400 Mg  Tbec (Mesalamine) .... 2 Tabs Tid 3)  Metoprolol Tartrate 50 Mg  Tabs (Metoprolol Tartrate) .... 1/2 Tab Bid 4)  Hytrin 5 Mg  Caps (Terazosin Hcl) .Marland Kitchen.. 1 By Mouth Qd 5)  Lisinopril 40 Mg Tabs (Lisinopril) .... 1/2 Tab By Mouth Two Times A Day 6)  Gabapentin 300 Mg  Caps (Gabapentin) .... Take 3 Tabs 5 Times Daily 7)  Pravastatin Sodium 40 Mg  Tabs (Pravastatin Sodium) .Marland Kitchen.. 1 By Mouth Qd 8)  Furosemide 40 Mg  Tabs (Furosemide) .Marland Kitchen.. 1 By Mouth Qd 9)  Aspirin 81 Mg Tbec (Aspirin) .... Take One Tablet By Mouth Daily 10)  Aleve 220 Mg Tabs (Naproxen Sodium) .... Take 1 Tablet By Mouth Two Times A Day 11)  Spironolactone 25 Mg Tabs (Spironolactone) .Marland Kitchen.. 1  Once Daily 12)  Tramadol Hcl 50 Mg Tabs (Tramadol Hcl) .Marland Kitchen.. 1 Q 6 Hrs As Needed Pain  Allergies: No Known Drug Allergies  Past History:  Past Surgical History: Last updated: 03/07/2009  Angioplasty with 2 stents in 1999.  Tonsillectomy Appendectomy  hemorrhoidectomy   lipoma removed  Past medical history reviewed for relevance to current acute and chronic problems.  Past Medical History: Reviewed history from 09/10/2009 and no changes required. CAD (ICD-414.00)- previous stenting back in 1999 AORTIC STENOSIS, MILD  (ICD-424.1) CAROTID STENOSIS (ICD-433.10) MURMUR (ICD-785.2) HYPERLIPIDEMIA (ICD-272.4) PERIPHERAL NEUROPATHY (ICD-356.9) HYPERTENSION, ESSENTIAL NOS (ICD-401.9) COLITIS, HX OF (ICD-V12.79) ARTHRALGIA (ICD-719.40) OLECRANON BURSITIS, RIGHT (ICD-726.33) VENOUS INSUFFICIENCY (ICD-459.81) EDEMA LEG (ICD-782.3) TONSILLECTOMY AND ADENOIDECTOMY, HX OF (ICD-V45.79) PSTPRC STATUS, PTCA (ICD-V45.82) CLUSTER HEADACHE (ICD-346.20) GOUT (ICD-274.9) POLYNEUROPATHY (ICD-357.9) history of traumatic subarachnoid hemorrhage 2010  Review of Systems       Negative except as per HPI   Vital Signs:  Patient profile:   77 year old male Height:      67 inches Weight:      192 pounds BMI:     30.18 Pulse rate:   50 / minute Resp:     14 per minute BP sitting:   123 / 65  (left arm)  Vitals Entered By: Kem Parkinson (March 14, 2010 11:49 AM)  Physical Exam  General:  Pt is alert and oriented, obese, elderly male, in no acute distress. HEENT: normal Neck: normal carotid upstrokes without bruits, JVP normal Lungs: CTA CV: RRR with 3/6 harsh systolic murmur LSB, A2 preserved. Abd: soft, NT, positive BS, no bruit, no organomegaly Ext: no clubbing, cyanosis, or edema. peripheral pulses 2+ and equal Skin: warm and dry without rash    EKG  Procedure date:  03/14/2010  Findings:      Sinus brady HR 50 bpm, T wave changes consider inferolateral ischemia.  Impression & Recommendations:  Problem # 1:  CAD (ICD-414.00) Pt is over 10 years out from PCI. He has  worsening dyspnea. Echo recently done and LVEF is normal, AS is only mild. He needs evaluation for obstructive CAD/coronary ischemia as a cause of his dyspnea. Will check a Lexiscan Myoview stress test. Continue ASA, ACE, beta-blocker.  His updated medication list for this problem includes:    Metoprolol Tartrate 50 Mg Tabs (Metoprolol tartrate) .Marland Kitchen... 1/2 tab bid    Lisinopril 40 Mg Tabs (Lisinopril) .Marland Kitchen... 1/2 tab by mouth two times a  day    Aspirin 81 Mg Tbec (Aspirin) .Marland Kitchen... Take one tablet by mouth daily  Orders: EKG w/ Interpretation (93000) Nuclear Stress Test (Nuc Stress Test)  Problem # 2:  AORTIC STENOSIS, MILD (ICD-424.1) Stable by exam. Continue clinical follow-up.  His updated medication list for this problem includes:    Metoprolol Tartrate 50 Mg Tabs (Metoprolol tartrate) .Marland Kitchen... 1/2 tab bid    Lisinopril 40 Mg Tabs (Lisinopril) .Marland Kitchen... 1/2 tab by mouth two times a day    Furosemide 40 Mg Tabs (Furosemide) .Marland Kitchen... 1 by mouth qd    Spironolactone 25 Mg Tabs (Spironolactone) .Marland Kitchen... 1  once daily  Problem # 3:  HYPERLIPIDEMIA (ICD-272.4) Lipids at goal in past but not up to date. Will recheck.  His updated medication list for this problem includes:    Pravastatin Sodium 40 Mg Tabs (Pravastatin sodium) .Marland Kitchen... 1 by mouth qd  CHOL: 153 (09/15/2008)   LDL: 90 (09/15/2008)   HDL: 53.0 (09/15/2008)   TG: 48 (09/15/2008)  Patient Instructions: 1)  Your physician recommends that you continue on your current medications as directed. Please refer to the Current Medication list given to you today. 2)  Your physician wants you to follow-up in: 6 MONTHS.   You will receive a reminder letter in the mail two months in advance. If you don't receive a letter, please call our office to schedule the follow-up appointment. 3)  Your physician has requested that you have a Lexiscan myoview.  For further information please visit [email or web address]  Please follow instruction sheet, as given. 4)  Your physician recommends that you return for a FASTING LIPID and LIVER Profile (272.0, 414.01) the sameday as Myoview.

## 2010-11-05 NOTE — Letter (Addendum)
Summary: Custom - Lipid  [facility], Main Office  [address]   [place], [place] [address]   Phone: [phone]  Fax: [phone]     July 02, 2010 MRN: [id number]   Paul Mays [address] [place], [place]  [address]   Dear Paul Mays,  We have reviewed your cholesterol results.  They are as follows:     Total Cholesterol:    166 (Desirable: less than 200)       HDL  Cholesterol:     86.20  (Desirable: greater than 40 for men and 50 for women)       LDL Cholesterol:       74  (Desirable: less than 100 for low risk and less than 70 for moderate to high risk)       Triglycerides:       31.0  (Desirable: less than 150)  Our recommendations include:  Your labs look fine.  Please continue your current medications.    Call our office at the number listed above if you have any questions.  Lowering your LDL cholesterol is important, but it is only one of a large number of "risk factors" that may indicate that you are at risk for heart disease, stroke or other complications of hardening of the arteries.  Other risk factors include:   A.  Cigarette Smoking* B.  High Blood Pressure* C.  Obesity* D.   Low HDL Cholesterol (see yours above)* E.   Diabetes Mellitus (higher risk if your is uncontrolled) F.  Family history of premature heart disease G.  Previous history of stroke or cardiovascular disease    *These are risk factors YOU HAVE CONTROL OVER.  For more information, visit .  There is now evidence that lowering the TOTAL CHOLESTEROL AND LDL CHOLESTEROL can reduce the risk of heart disease.  The American Heart Association recommends the following guidelines for the treatment of elevated cholesterol:  1.  If there is now current heart disease and less than two risk factors, TOTAL CHOLESTEROL should be less than 200 and LDL CHOLESTEROL should be less than 100. 2.  If there is current heart disease or two or more risk factors, TOTAL CHOLESTEROL  should be less than 200 and LDL CHOLESTEROL should be less than 70.  A diet low in cholesterol, saturated fat, and calories is the cornerstone of treatment for elevated cholesterol.  Cessation of smoking and exercise are also important in the management of elevated cholesterol and preventing vascular disease.  Studies have shown that 30 to 60 minutes of physical activity most days can help lower blood pressure, lower cholesterol, and keep your weight at a healthy level.  Drug therapy is used when cholesterol levels do not respond to therapeutic lifestyle changes (smoking cessation, diet, and exercise) and remains unacceptably high.  If medication is started, it is important to have you levels checked periodically to evaluate the need for further treatment options.  Thank you,    [employer] Team

## 2010-11-07 ENCOUNTER — Ambulatory Visit: Payer: Self-pay | Admitting: Internal Medicine

## 2010-11-07 NOTE — Assessment & Plan Note (Addendum)
Summary: 6 month rov   Visit Type:  Follow-up Primary Mccade Sullenberger:  Marga Melnick MD  CC:  No complaints.  History of Present Illness: This is a 77 year old gentleman with history of coronary artery disease status post percutaneous coronary intervention in the 57s. He underwent stenting of the left circumflex and right coronary artery. He presents today for followup evaluation.    He complains of dyspnea with exertion - experiences this when he walks the dog. This is unchanged. No chest pain, orthopnea, or PND. He does have leg swelling.    Current Medications (verified): 1)  Allopurinol 300 Mg Tabs (Allopurinol) .Marland Kitchen.. 1 By Mouth Once Daily 2)  Asacol 400 Mg  Tbec (Mesalamine) .... 2 Tabs Tid 3)  Metoprolol Tartrate 50 Mg  Tabs (Metoprolol Tartrate) .... 1/2 Tab Bid 4)  Hytrin 5 Mg  Caps (Terazosin Hcl) .Marland Kitchen.. 1 By Mouth Qd 5)  Lisinopril 40 Mg Tabs (Lisinopril) .... 1/2 Tab By Mouth Two Times A Day 6)  Gabapentin 300 Mg  Caps (Gabapentin) .... Take 3 Tabs 5 Times Daily 7)  Pravastatin Sodium 80 Mg Tabs (Pravastatin Sodium) .... Take One Tablet By Mouth Daily At Bedtime 8)  Furosemide 40 Mg  Tabs (Furosemide) .... As Needed 9)  Aspirin 81 Mg Tbec (Aspirin) .... Take One Tablet By Mouth Daily 10)  Aleve 220 Mg Tabs (Naproxen Sodium) .... Take 1 Tablet By Mouth Two Times A Day 11)  Spironolactone 25 Mg Tabs (Spironolactone) .Marland Kitchen.. 1  Once Daily 12)  Tramadol Hcl 50 Mg Tabs (Tramadol Hcl) .Marland Kitchen.. 1 Q 6 Hrs As Needed Pain  Allergies (verified): No Known Drug Allergies  Past History:  Past medical history reviewed for relevance to current acute and chronic problems.  Past Medical History: CAD (ICD-414.00)- previous stenting back in 1999 AORTIC STENOSIS, MILD (ICD-424.1) CAROTID STENOSIS (ICD-433.10) HYPERLIPIDEMIA (ICD-272.4) PERIPHERAL NEUROPATHY (ICD-356.9) HYPERTENSION, ESSENTIAL NOS (ICD-401.9) COLITIS, HX OF (ICD-V12.79) ARTHRALGIA (ICD-719.40) OLECRANON BURSITIS, RIGHT  (ICD-726.33) VENOUS INSUFFICIENCY (ICD-459.81) EDEMA LEG (ICD-782.3) TONSILLECTOMY AND ADENOIDECTOMY, HX OF (ICD-V45.79) PSTPRC STATUS, PTCA (ICD-V45.82) CLUSTER HEADACHE (ICD-346.20) GOUT (ICD-274.9) POLYNEUROPATHY (ICD-357.9) history of traumatic subarachnoid hemorrhage 2010  Review of Systems       Negative except as per HPI   Vital Signs:  Patient profile:   77 year old male Height:      67 inches Weight:      194 pounds BMI:     30.49 Pulse rate:   61 / minute Pulse rhythm:   regular Resp:     18 per minute BP sitting:   150 / 60  (left arm) Cuff size:   large  Vitals Entered By: Vikki Ports (September 16, 2010 1:43 PM)  Physical Exam  General:  Pt is alert and oriented, obese male in no acute distress. HEENT: normal Neck: normal carotid upstrokes without bruits, JVP normal Lungs: CTA CV: RRR with 3/6 midpeaking crescendo murmur RUSB, preserved aortic closure sounds Abd: soft, NT, positive BS, no bruit, no organomegaly Ext: 1+ bilateral pretibial edema. peripheral pulses 2+ and equal Skin: warm and dry without rash    Nuclear Study  Procedure date:  03/21/2010  Findings:      QPS  Raw Data Images:  Normal; no motion artifact; normal heart/lung ratio. Stress Images:  NI: Uniform and normal uptake of tracer in all myocardial segments. Rest Images:  Normal homogeneous uptake in all areas of the myocardium. Subtraction (SDS):  Normal Transient Ischemic Dilatation:  1.17  (Normal <1.22)  Lung/Heart Ratio:  .3  (Normal <  0.45)  Quantitative Gated Spect Images  QGS EDV:  137 ml QGS ESV:  59 ml QGS EF:  57 % QGS cine images:  normal  Findings  Normal nuclear study      Overall Impression   Exercise Capacity: Lexiscan BP Response: Normal blood pressure response. Clinical Symptoms: light headed ECG Impression: No significant ST segment change suggestive of ischemia. Overall Impression: Normal stress nuclear study.  Carotid Doppler  Procedure  date:  09/10/2009  Findings:      40-59% bilateral ICA stenosis. 1 yr followup recommended.  EKG  Procedure date:  09/16/2010  Findings:      NSR 61 bpm, within normal limits.  Echocardiogram  Procedure date:  04/02/2009  Findings:      Study Conclusions            1. Left ventricle: The cavity size was normal. There was mild        concentric and moderate asymmetric hypertrophy of the septum.        Systolic function was normal. The estimated ejection fraction was        in the range of 55% to 60%. Dynamic obstruction cannot be        excluded. Wall motion was normal; there were no regional wall        motion abnormalities.     2. Aortic valve: There was very mild stenosis. Mild to moderate        regurgitation. Valve area: 2.11cm 2(VTI). Valve area: 2.13cm 2        (Vmax).     3. Mitral valve: Mildly calcified annulus. Mild regurgitation.     4. Left atrium: The atrium was moderately to severely dilated.     5. Right atrium: The atrium was mildly dilated.     6. Pulmonary arteries: Systolic pressure was moderately increased.        PA peak pressure: 68mm Hg (S).  Impression & Recommendations:  Problem # 1:  CAD (ICD-414.00) Stable without angina. Continue current medical program.  His updated medication list for this problem includes:    Metoprolol Tartrate 50 Mg Tabs (Metoprolol tartrate) .Marland Kitchen... 1/2 tab bid    Lisinopril 40 Mg Tabs (Lisinopril) .Marland Kitchen... 1/2 tab by mouth two times a day    Aspirin 81 Mg Tbec (Aspirin) .Marland Kitchen... Take one tablet by mouth daily  Orders: EKG w/ Interpretation (93000) Echocardiogram (Echo) Sleep Disorder Referral (Sleep Disorder)  Problem # 2:  AORTIC STENOSIS, MILD (ICD-424.1) Murmur suggests mild to moderate AS. Followup echo to reassess pulmonary HTN will provide further evaluation of his AS.  His updated medication list for this problem includes:    Metoprolol Tartrate 50 Mg Tabs (Metoprolol tartrate) .Marland Kitchen... 1/2 tab bid    Lisinopril 40  Mg Tabs (Lisinopril) .Marland Kitchen... 1/2 tab by mouth two times a day    Furosemide 40 Mg Tabs (Furosemide) .Marland Kitchen... As needed    Spironolactone 25 Mg Tabs (Spironolactone) .Marland Kitchen... 1  once daily  Problem # 3:  CAROTID STENOSIS (ICD-433.10) Moderate carotid stenosis less than 60% bilateral last year.  His updated medication list for this problem includes:    Aspirin 81 Mg Tbec (Aspirin) .Marland Kitchen... Take one tablet by mouth daily  Problem # 4:  HYPERTENSION, ESSENTIAL NOS (ICD-401.9) BP has been well-controlled for the most part...he missed his lisinopril this morning.  His updated medication list for this problem includes:    Metoprolol Tartrate 50 Mg Tabs (Metoprolol tartrate) .Marland Kitchen... 1/2 tab bid    Hytrin 5  Mg Caps (Terazosin hcl) .Marland Kitchen... 1 by mouth qd    Lisinopril 40 Mg Tabs (Lisinopril) .Marland Kitchen... 1/2 tab by mouth two times a day    Furosemide 40 Mg Tabs (Furosemide) .Marland Kitchen... As needed    Aspirin 81 Mg Tbec (Aspirin) .Marland Kitchen... Take one tablet by mouth daily    Spironolactone 25 Mg Tabs (Spironolactone) .Marland Kitchen... 1  once daily  BP today: 150/60 Prior BP: 130/70 (06/04/2010)  Prior 10 Yr Risk Heart Disease: N/A (11/15/2009)  Labs Reviewed: K+: 4.4 (11/15/2009) Creat: : 1.3 (11/15/2009)   Chol: 166 (07/01/2010)   HDL: 86.20 (07/01/2010)   LDL: 74 (07/01/2010)   TG: 31.0 (07/01/2010)  Problem # 5:  HYPERLIPIDEMIA (ICD-272.4) Lipids well-controlled.  His updated medication list for this problem includes:    Pravastatin Sodium 80 Mg Tabs (Pravastatin sodium) .Marland Kitchen... Take one tablet by mouth daily at bedtime  CHOL: 166 (07/01/2010)   LDL: 74 (07/01/2010)   HDL: 86.20 (07/01/2010)   TG: 31.0 (07/01/2010)  Problem # 6:  OTHER DYSPNEA AND RESPIRATORY ABNORMALITIES (ICD-786.09) Pulmonary HTN noted at last echo. Will recheck an echo and also check a sleep study to rule out obstructive sleep apnea as an etiology.  His updated medication list for this problem includes:    Metoprolol Tartrate 50 Mg Tabs (Metoprolol tartrate)  .Marland Kitchen... 1/2 tab bid    Lisinopril 40 Mg Tabs (Lisinopril) .Marland Kitchen... 1/2 tab by mouth two times a day    Furosemide 40 Mg Tabs (Furosemide) .Marland Kitchen... As needed    Aspirin 81 Mg Tbec (Aspirin) .Marland Kitchen... Take one tablet by mouth daily    Spironolactone 25 Mg Tabs (Spironolactone) .Marland Kitchen... 1  once daily  Patient Instructions: 1)  Your physician recommends that you continue on your current medications as directed. Please refer to the Current Medication list given to you today. 2)  Your physician wants you to follow-up in:  6 MONTHS.  You will receive a reminder letter in the mail two months in advance. If you don't receive a letter, please call our office to schedule the follow-up appointment. 3)  Your physician has requested that you have an echocardiogram.  Echocardiography is a painless test that uses sound waves to create images of your heart. It provides your doctor with information about the size and shape of your heart and how well your heart's chambers and valves are working.  This procedure takes approximately one hour. There are no restrictions for this procedure. 4)  Your physician has recommended that you have a sleep study.  This test records several body functions during sleep, including:  brain activity, eye movement, oxygen and carbon dioxide blood levels, heart rate and rhythm, breathing rate and rhythm, the flow of air through your mouth and nose, snoring, body muscle movements, and chest and belly movement.

## 2010-11-08 ENCOUNTER — Encounter: Payer: Self-pay | Admitting: Internal Medicine

## 2010-11-08 ENCOUNTER — Ambulatory Visit (INDEPENDENT_AMBULATORY_CARE_PROVIDER_SITE_OTHER): Payer: Medicare Other | Admitting: Internal Medicine

## 2010-11-08 ENCOUNTER — Encounter: Payer: Self-pay | Admitting: Cardiovascular Disease

## 2010-11-08 DIAGNOSIS — G4733 Obstructive sleep apnea (adult) (pediatric): Secondary | ICD-10-CM | POA: Insufficient documentation

## 2010-11-08 DIAGNOSIS — I359 Nonrheumatic aortic valve disorder, unspecified: Secondary | ICD-10-CM

## 2010-11-08 DIAGNOSIS — R609 Edema, unspecified: Secondary | ICD-10-CM

## 2010-11-11 LAB — CONVERTED CEMR LAB
BUN: 21 mg/dL (ref 6–23)
Creatinine, Ser: 1.05 mg/dL (ref 0.40–1.50)

## 2010-11-13 NOTE — Miscellaneous (Addendum)
Summary: Orders Update  Clinical Lists Changes  Problems: Added new problem of OBSTRUCTIVE SLEEP APNEA (ICD-327.23) Orders: Added new Referral order of Sleep Disorder Referral (Sleep Disorder) - Signed

## 2010-11-13 NOTE — Assessment & Plan Note (Addendum)
Summary: swelling in both ankles///sph--first appt offered that he would    Vital Signs:  Patient profile:   77 year old male Weight:      191.8 pounds BMI:     30.15 Temp:     98.1 degrees F oral Pulse rate:   60 / minute Resp:     15 per minute BP sitting:   136 / 82  (left arm) Cuff size:   large  Vitals Entered By: Shonna Chock CMA (November 08, 2010 3:29 PM) CC: Ongoing concern: ankle swelling , CHF follow-up   Primary Care Provider:  Marga Melnick MD  CC:  Ongoing concern: ankle swelling  and CHF follow-up.  History of Present Illness:    Onset several  months  ago w/o trigger; RLE > LLE . Edema resolves overnight. The patient denies dyspnea at rest, dyspnea on exertion, orthopnea, PND, and fatigue.  The patient denies chest pain at rest, palpitations, and lightheadedness.   He has   severe Sleep Apnea as per study 10/16/2010; ECHO revealed AS. ALT & AST were WNL in 09/11.  Current Medications (verified): 1)  Allopurinol 300 Mg Tabs (Allopurinol) .Marland Kitchen.. 1 By Mouth Once Daily 2)  Asacol 400 Mg  Tbec (Mesalamine) .... 2 Tabs Tid 3)  Metoprolol Tartrate 50 Mg  Tabs (Metoprolol Tartrate) .... 1/2 Tab Bid 4)  Hytrin 5 Mg  Caps (Terazosin Hcl) .Marland Kitchen.. 1 By Mouth Qd 5)  Lisinopril 40 Mg Tabs (Lisinopril) .... 1/2 Tab By Mouth Two Times A Day 6)  Gabapentin 300 Mg  Caps (Gabapentin) .... Take 3 Tabs 5 Times Daily 7)  Pravastatin Sodium 80 Mg Tabs (Pravastatin Sodium) .... Take One Tablet By Mouth Daily At Bedtime 8)  Furosemide 40 Mg  Tabs (Furosemide) .... As Needed 9)  Aspirin 81 Mg Tbec (Aspirin) .... Take One Tablet By Mouth Daily 10)  Aleve 220 Mg Tabs (Naproxen Sodium) .... Take 1 Tablet By Mouth Two Times A Day 11)  Spironolactone 25 Mg Tabs (Spironolactone) .Marland Kitchen.. 1  Once Daily  Allergies (verified): No Known Drug Allergies  Past History:  Past Medical History: CAD (ICD-414.00)- previous stenting back in 1999 AORTIC STENOSIS, MILD (ICD-424.1) CAROTID STENOSIS  (ICD-433.10) HYPERLIPIDEMIA (ICD-272.4) PERIPHERAL NEUROPATHY (ICD-356.9) HYPERTENSION, ESSENTIAL NOS (ICD-401.9) COLITIS, HX OF (ICD-V12.79) ARTHRALGIA (ICD-719.40) OLECRANON BURSITIS, RIGHT (ICD-726.33) VENOUS INSUFFICIENCY (ICD-459.81) EDEMA LEG (ICD-782.3) TONSILLECTOMY AND ADENOIDECTOMY, HX OF (ICD-V45.79) PSTPRC STATUS, PTCA (ICD-V45.82) CLUSTER HEADACHE (ICD-346.20) GOUT (ICD-274.9) POLYNEUROPATHY (ICD-357.9) history of traumatic subarachnoid hemorrhage 2010 Sleep Apnea  Physical Exam  General:  Appears younger than age,in no acute distress; alert,appropriate and cooperative throughout examination Lungs:  Normal respiratory effort, chest expands symmetrically. Lungs are clear to auscultation, no crackles or wheezes. Heart:  normal rate, no murmur, no gallop, no rub, no JVD, no HJR, and grade 2 /6 systolic murmur  of AS.   Abdomen:  Bowel sounds positive,abdomen soft and non-tender without masses, organomegaly or hernias noted. Extremities:  1/2 + left  & R pedal edema.     Impression & Recommendations:  Problem # 1:  EDEMA LEG (ICD-782.3)  His updated medication list for this problem includes:    Furosemide 40 Mg Tabs (Furosemide) .Marland Kitchen... As needed    Spironolactone 25 Mg Tabs (Spironolactone) .Marland Kitchen... 1  once daily  Orders: Venipuncture (16109) TLB-Creatinine, Blood (82565-CREA) TLB-BUN (Urea Nitrogen) (84520-BUN)  Problem # 2:  OBSTRUCTIVE SLEEP APNEA (ICD-327.23)  Problem # 3:  AORTIC STENOSIS, MILD (ICD-424.1)  His updated medication list for this problem includes:  Metoprolol Tartrate 50 Mg Tabs (Metoprolol tartrate) .Marland Kitchen... 1/2 tab bid    Aspirin 81 Mg Tbec (Aspirin) .Marland Kitchen... Take one tablet by mouth daily  Complete Medication List: 1)  Allopurinol 300 Mg Tabs (Allopurinol) .Marland Kitchen.. 1 by mouth once daily 2)  Asacol 400 Mg Tbec (Mesalamine) .... 2 tabs tid 3)  Metoprolol Tartrate 50 Mg Tabs (Metoprolol tartrate) .... 1/2 tab bid 4)  Hytrin 5 Mg Caps (Terazosin hcl)  .Marland Kitchen.. 1 by mouth qd 5)  Lisinopril 40 Mg Tabs (Lisinopril) .... 1/2 tab by mouth two times a day 6)  Gabapentin 300 Mg Caps (Gabapentin) .... Take 3 tabs 5 times daily 7)  Pravastatin Sodium 80 Mg Tabs (Pravastatin sodium) .... Take one tablet by mouth daily at bedtime 8)  Furosemide 40 Mg Tabs (Furosemide) .... As needed 9)  Aspirin 81 Mg Tbec (Aspirin) .... Take one tablet by mouth daily 10)  Aleve 220 Mg Tabs (Naproxen sodium) .... Take 1 tablet by mouth two times a day 11)  Spironolactone 25 Mg Tabs (Spironolactone) .Marland Kitchen.. 1  once daily  Patient Instructions: 1)  Limit your Sodium (Salt) to less than 4 grams a day (slightly less than 1 teaspoon) to prevent fluid retention, swelling, or worsening or symptoms. Wear support hose.   Orders Added: 1)  Est. Patient Level III [62694] 2)  Venipuncture [85462] 3)  TLB-Creatinine, Blood [82565-CREA] 4)  TLB-BUN (Urea Nitrogen) [84520-BUN]  Appended Document: swelling in both ankles///sph--first appt offered that he would

## 2010-12-04 ENCOUNTER — Institutional Professional Consult (permissible substitution) (INDEPENDENT_AMBULATORY_CARE_PROVIDER_SITE_OTHER): Payer: Medicare Other | Admitting: Pulmonary Disease

## 2010-12-04 ENCOUNTER — Encounter: Payer: Self-pay | Admitting: Pulmonary Disease

## 2010-12-04 DIAGNOSIS — G4733 Obstructive sleep apnea (adult) (pediatric): Secondary | ICD-10-CM

## 2010-12-09 ENCOUNTER — Telehealth: Payer: Self-pay | Admitting: Cardiovascular Disease

## 2010-12-09 ENCOUNTER — Telehealth (INDEPENDENT_AMBULATORY_CARE_PROVIDER_SITE_OTHER): Payer: Self-pay | Admitting: *Deleted

## 2010-12-11 ENCOUNTER — Telehealth: Payer: Self-pay | Admitting: Cardiovascular Disease

## 2010-12-12 ENCOUNTER — Other Ambulatory Visit: Payer: Self-pay | Admitting: Cardiovascular Disease

## 2010-12-12 ENCOUNTER — Encounter: Payer: Self-pay | Admitting: Cardiovascular Disease

## 2010-12-12 ENCOUNTER — Other Ambulatory Visit (INDEPENDENT_AMBULATORY_CARE_PROVIDER_SITE_OTHER): Payer: Medicare Other

## 2010-12-12 DIAGNOSIS — I2510 Atherosclerotic heart disease of native coronary artery without angina pectoris: Secondary | ICD-10-CM

## 2010-12-12 DIAGNOSIS — E785 Hyperlipidemia, unspecified: Secondary | ICD-10-CM

## 2010-12-12 LAB — HEPATIC FUNCTION PANEL
ALT: 24 U/L (ref 0–53)
AST: 26 U/L (ref 0–37)
Albumin: 4 g/dL (ref 3.5–5.2)
Alkaline Phosphatase: 23 U/L — ABNORMAL LOW (ref 39–117)
Bilirubin, Direct: 0.2 mg/dL (ref 0.0–0.3)
Total Protein: 5.9 g/dL — ABNORMAL LOW (ref 6.0–8.3)

## 2010-12-12 LAB — LIPID PANEL
Cholesterol: 169 mg/dL (ref 0–200)
LDL Cholesterol: 66 mg/dL (ref 0–99)
Triglycerides: 33 mg/dL (ref 0.0–149.0)

## 2010-12-17 NOTE — Progress Notes (Addendum)
Summary: question re meds  Phone Note Call from Patient Call back at Home Phone [phone]   Caller: Patient Reason for Call: Talk to Nurse Summary of Call: pt has question re PRAVASTATIN SODIUM 80 MG TABS Initial call taken by: Roe Coombs,  December 09, 2010 1:35 PM  Follow-up for Phone Call        Attempted to call patient with  no answer. No VM set up. [name] Maeola Sarah RN  December 09, 2010 2:55 PM  Follow-up by: [name] Maeola Sarah RN,  December 09, 2010 2:55 PM  Additional Follow-up for Phone Call Additional follow up Details #1::        Spoke with wife - pt not available and will call back.  Per wife pt is ready to be treated with CPAP as was previously discussed.  He also saw his MD at the [place] who says he is taking too much Pravastatin and should be on a lower dose.  Wife would rather we discuss information with the pt and she will have him call us back.  Wife aware Dr Excell Seltzer not in the office this week Additional Follow-up by: Charolotte Capuchin, RN,  December 09, 2010 3:01 PM    Additional Follow-up for Phone Call Additional follow up Details #2::    pt calling back re meds. pt would like to talk to a nurse.     I spoke with the pt and made him aware that he should continue Pravastatin 80mg  daily.  The pt's cholesterol numbers were checked in Sept 2011 and his LDL was within goal.  The pt did state that he is a little "sluggish" when walking his dog and wondered if this was related to Pravastatin.  I made the pt aware that this could be coming from statin and recommended that he try CO Q10.  If this does not help his symptoms he will call us back.  The pt has also already been in touch with Pulmonary about CPAP and [facility] will be contacting him to schedule delivery of machine.  Follow-up by: Julieta Gutting, RN, BSN,  December 10, 2010 9:36 AM

## 2010-12-17 NOTE — Assessment & Plan Note (Signed)
Summary: consult for management of osa   Visit Type:  Initial Consult Copy to:  Tonny Bollman Primary Provider/Referring Provider:  Marga Melnick  CC:  Sleep Consult for abnormal sleep study.  pt states his wife c/o pt snoring while sleeping. Marland Kitchen  History of Present Illness: The pt is an 77y/o male who I have been asked to see for management of osa.  He recently underwent a split night study where he was noted to have severe osa.  He was found to have AHI 67/hr, desat trasiently to 77%, and an optimal cpap pressure of 17cm.  The pt has known loud snoring, and his wife has moved to a different bedroom in the house.  He typically sleeps from 9pm to 730am, and feels that he is rested upon arising?  He notes definite sleep pressure during the day with periods of inactivity, and some issues with driving longer distances.  His weight is up 10 pounds over the last 2 yrs.    Preventive Screening-Counseling & Management  Alcohol-Tobacco     Smoking Status: never  Current Medications (verified): 1)  Prednisone 5 Mg Tabs (Prednisone) .... Taper As Directed. 2)  Allopurinol 300 Mg Tabs (Allopurinol) .... Take 1 Tablet By Mouth Once A Day 3)  Asacol 400 Mg Tbec (Mesalamine) .... Take 2 Tabs By Mouth Two Times A Day 4)  Metoprolol Tartrate 50 Mg Tabs (Metoprolol Tartrate) .... 1/2 Tab By Mouth Two Times A Day 5)  Terazosin Hcl 10 Mg Caps (Terazosin Hcl) .... Take 1 Tab By Mouth At Bedtime 6)  Lisinopril 40 Mg Tabs (Lisinopril) .... 1/2 Tab By Mouth Two Times A Day 7)  Gabapentin 300 Mg Caps (Gabapentin) .... Take 3 Tabs By Mouth 5 Times A Day 8)  Pravastatin Sodium 80 Mg Tabs (Pravastatin Sodium) .... Take 1 Tab By Mouth At Bedtime 9)  Furosemide 40 Mg Tabs (Furosemide) .... As Needed For Swelling 10)  Aspirin Low Dose 81 Mg Tabs (Aspirin) .... Take 1 Tablet By Mouth Once A Day 11)  Aleve 220 Mg Tabs (Naproxen Sodium) .... Take 1 Tablet By Mouth Two Times A Day 12)  Spironolactone 25 Mg Tabs  (Spironolactone) .... Take 1 Tablet By Mouth Once A Day  Allergies (verified): No Known Drug Allergies  Past History:  Past Medical History:  EDEMA (ICD-782.3) VENOUS INSUFFICIENCY (ICD-459.81) OLECRANON BURSITIS, RIGHT (ICD-726.33) ARTHRALGIA (ICD-719.40) COLITIS, HX OF (ICD-V12.79) PERIPHERAL NEUROPATHY (ICD-356.9) MURMUR (ICD-785.2) CAROTID STENOSIS (ICD-433.10) AORTIC STENOSIS (ICD-424.1) CAD (ICD-414.00) HEARING LOSS, BILATERAL (ICD-389.9) IMPACTED CERUMEN (ICD-380.4) ANXIETY (ICD-300.00) HYPERURICEMIA (ICD-790.6) HYPERTENSION, PULMONARY (ICD-416.8) POLYNEUROPATHY (ICD-357.9) GOUT (ICD-274.9) OBSTRUCTIVE SLEEP APNEA (ICD-327.23)--AHI 67/hr 2012 HYPERTENSION (ICD-401.9) HYPERLIPIDEMIA (ICD-272.4)    Past Surgical History:  Angioplasty with 2 stents in 1999.  Tonsillectomy Appendectomy  hemorrhoidectomy   lipoma removed  Family History: Reviewed history and no changes required. none per pt  Social History: Reviewed history and no changes required. Patient never smoked.  pt is married and lives with wife. pt has children. pt is retired.  prev worked as a Clinical biochemist.Smoking Status:  never  Review of Systems       The patient complains of hand/feet swelling.  The patient denies shortness of breath with activity, shortness of breath at rest, productive cough, non-productive cough, coughing up blood, chest pain, irregular heartbeats, acid heartburn, indigestion, loss of appetite, weight change, abdominal pain, difficulty swallowing, sore throat, tooth/dental problems, headaches, nasal congestion/difficulty breathing through nose, sneezing, itching, ear ache, anxiety, depression, joint stiffness or pain, rash, change in color  of mucus, and fever.    Vital Signs:  Patient profile:   77 year old male Height:      67 inches Weight:      196 pounds BMI:     30.81 O2 Sat:      96 % on Room air Pulse rate:   61 / minute BP sitting:   146 / 62  (right  arm) Cuff size:   regular  Vitals Entered By: Arman Filter LPN (December 04, 2010 2:20 PM)  O2 Flow:  Room air CC: Sleep Consult for abnormal sleep study.  pt states his wife c/o pt snoring while sleeping.  Comments Medications reviewed with patient Arman Filter LPN  December 04, 2010 2:29 PM    Physical Exam  General:  ow male in nad Eyes:  PERRLA and EOMI.   Nose:  mild septal deviation to the left, but patent bilat. Mouth:  mild elongation of uvula, normal palate. Neck:  no jvd, tmg, LN Lungs:  totally clear to auscultation Heart:  rrr, 3/6 sem Abdomen:  soft and nontender, bs+ Extremities:  no significant edema, pulses intact distally no cyanosis  Neurologic:  awake, a little sleepy, moves all 4    Impression & Recommendations:  Problem # 1:  OBSTRUCTIVE SLEEP APNEA (ICD-327.23)  the pt has severe osa that is best treated with cpap while trying to work on weight loss.  The pt is very hesitant to do this...he feels he will not be able to tolerate the mask, and wants to primarily work on weight reduction.  I have had a long discussion with him about osa, including my concerns about the severity of his disease, and the need to treat more aggressively.  I have reviewed the health and cardiac impact of untreated and severe disease with him.  He is going to think about trying cpap, and will get back with me.    Medications Added to Medication List This Visit: 1)  Prednisone 5 Mg Tabs (Prednisone) .... Taper as directed. 2)  Allopurinol 300 Mg Tabs (Allopurinol) .... Take 1 tablet by mouth once a day 3)  Asacol 400 Mg Tbec (Mesalamine) .... Take 2 tabs by mouth two times a day 4)  Metoprolol Tartrate 50 Mg Tabs (Metoprolol tartrate) .... 1/2 tab by mouth two times a day 5)  Terazosin Hcl 10 Mg Caps (Terazosin hcl) .... Take 1 tab by mouth at bedtime 6)  Lisinopril 40 Mg Tabs (Lisinopril) .... 1/2 tab by mouth two times a day 7)  Gabapentin 300 Mg Caps (Gabapentin) .... Take 3  tabs by mouth 5 times a day 8)  Pravastatin Sodium 80 Mg Tabs (Pravastatin sodium) .... Take 1 tab by mouth at bedtime 9)  Furosemide 40 Mg Tabs (Furosemide) .... As needed for swelling 10)  Aspirin Low Dose 81 Mg Tabs (Aspirin) .... Take 1 tablet by mouth once a day 11)  Aleve 220 Mg Tabs (Naproxen sodium) .... Take 1 tablet by mouth two times a day 12)  Spironolactone 25 Mg Tabs (Spironolactone) .... Take 1 tablet by mouth once a day  Other Orders: Consultation Level IV (62130)  Patient Instructions: 1)  think about trying cpap while working on weight loss 2)  please call me with your decision.

## 2010-12-17 NOTE — Progress Notes (Signed)
Summary: cpap [id number] pt has been called  Phone Note Call from Patient Call back at Home Phone [phone]   Caller: Patient Call For: clance Summary of Call: Pt states he wants to start on cpap machine. Initial call taken by: Darletta Moll,  December 09, 2010 12:16 PM  Follow-up for Phone Call        [facility]  Aundra Millet Reynolds LPN  December 08, 1476 3:25 PM   Additional Follow-up for Phone Call Additional follow up Details #1::        order sent to [facility].  let pt know.  I need to see him in 6 weeks.  go ahead and make appt let him know to call me if tolerance issues. Additional Follow-up by: Barbaraann Share MD,  December 10, 2010 6:40 AM    Additional Follow-up for Phone Call Additional follow up Details #2::    Spoke with pt this am, referral to be sent to [facility], 6 weeks appt sched, pt has been informed of Dr Shelle Iron recommendations. Kandice Hams CMA  December 10, 2010 9:13 AM  Follow-up by: Kandice Hams CMA,  December 10, 2010 9:13 AM

## 2010-12-17 NOTE — Progress Notes (Addendum)
Summary: Discuss Pravastatin  Phone Note Call from Patient Call back at Home Phone [phone]   Caller: Patient Summary of Call: Pt calling regarding some medication needing to be changed Initial call taken by: Judie Grieve,  December 11, 2010 3:14 PM  Follow-up for Phone Call        Left message with wife for patient to call back. Follow-up by: Dessie Coma  LPN,  December 11, 2010 3:24 PM  Additional Follow-up for Phone Call Additional follow up Details #1::        I spoke with the pt and he said he got a letter from the MD at the [facility] and it states HDL is 129 and LDL is in the 40s.  MD at [facility] instructed pt to decrease Pravastatin to 40mg  daily.  I told the pt that he can either [facility] or can have his lipids rechecked in our office. The pt would like to have his lipids rechecked in our office so that Dr Excell Seltzer can review his results. Lab appt scheduled.  Additional Follow-up by: Julieta Gutting, RN, BSN,  December 11, 2010 3:49 PM     Appended Document: Discuss Pravastatin reviewed labs - we did lipids in 2011 and HDL was in 80's, LDL in 70's. I'm fine for him to reduce pravastatin to 40 mg.  Appended Document: Discuss Pravastatin Pt aware of 12/12/10 lab results and instructed to decrease Pravastatin dose.

## 2010-12-23 ENCOUNTER — Telehealth: Payer: Self-pay | Admitting: Pulmonary Disease

## 2010-12-26 ENCOUNTER — Encounter: Payer: Self-pay | Admitting: Internal Medicine

## 2010-12-27 ENCOUNTER — Ambulatory Visit (INDEPENDENT_AMBULATORY_CARE_PROVIDER_SITE_OTHER): Payer: Medicare Other | Admitting: Internal Medicine

## 2010-12-27 ENCOUNTER — Encounter: Payer: Self-pay | Admitting: Internal Medicine

## 2010-12-27 VITALS — BP 120/70 | HR 56 | Temp 98.1°F | Ht 66.0 in | Wt 188.8 lb

## 2010-12-27 DIAGNOSIS — R5383 Other fatigue: Secondary | ICD-10-CM

## 2010-12-27 DIAGNOSIS — R5381 Other malaise: Secondary | ICD-10-CM

## 2010-12-27 DIAGNOSIS — R002 Palpitations: Secondary | ICD-10-CM

## 2010-12-27 LAB — CBC WITH DIFFERENTIAL/PLATELET
Basophils Absolute: 0 10*3/uL (ref 0.0–0.1)
Eosinophils Absolute: 0.1 10*3/uL (ref 0.0–0.7)
Hemoglobin: 12.4 g/dL — ABNORMAL LOW (ref 13.0–17.0)
Lymphocytes Relative: 17 % (ref 12.0–46.0)
MCHC: 34.2 g/dL (ref 30.0–36.0)
Neutro Abs: 2.8 10*3/uL (ref 1.4–7.7)
Platelets: 114 10*3/uL — ABNORMAL LOW (ref 150.0–400.0)
RDW: 14.9 % — ABNORMAL HIGH (ref 11.5–14.6)

## 2010-12-27 LAB — CALCIUM: Calcium: 9.3 mg/dL (ref 8.4–10.5)

## 2010-12-27 LAB — POTASSIUM: Potassium: 5.5 mEq/L — ABNORMAL HIGH (ref 3.5–5.1)

## 2010-12-27 NOTE — Patient Instructions (Signed)
Please avoid excess stimulants such as caffeine and decongestants as we discussed. If the palpitations for persist or progress once you've started CPAP then a 24 hour  heart monitor will be completed.

## 2010-12-27 NOTE — Progress Notes (Signed)
  Subjective:    Patient ID: Paul Mays, male    DOB: May 12, 1928, 77 y.o.   MRN: [id number]  HPI  Mr.Mastrangelo  is here for  Palpitations and fatigue.   He has daily palpitations the less several weeks which will last less than a few seconds. These can occur at rest    he's had fatigue for at least one month. Significantly he has sleep apnea and will start using the CPAP tonight    he was seen at the [facility] on March 5. A lipid profile revealed an LDL or bad cholesterol 49. His HDL or good cholesterol was 124. His pravastatin was decreased from 80 mg daily to 40.    Review of Systems.  He denies blurred vision, double vision, or loss of vision.   He denies fever , chills, or unexplained weight loss. He has lost 10 pounds purposefully.   He denies any lymphadenopathy or rash. He does bruise easily.   He denies abdominal pain, rectal bleeding, or melena    there is been no change in his hair, skin, or nails.   He denies temperature intolerance ; he has had no tremor.         Objective:   Physical Exam  On exam he appears younger than his stated age; he is in no acute distress.   extraocular motions intact without nystagmus. He has no scleral icterus.   He has no lymphadenopathy about the head neck or axilla.   heart rhythm is regular with a grade 1 systolic murmur at the left sternal border.   chest is clear to auscultation with no increased work of breathing, rales, or rhonchi.    thyroid is normal to palpation with no nodules or abnormal enlargement.   deep tendon reflexes are normal. No tremor is present. He does exhibit bruising in a scattered distribution; there is no jaundice.        Assessment & Plan:   #1 palpitations    #2 fatigue ; this is in the context of sleep apnea which is as yet untreated    #3 dyslipidemia well-controlled    Plan : labs will be collected to assess the palpitations and fatigue. The fatigue may be due to the sleep apnea which has not been  treated as yet.    if results are normal no palpitations persist once the CPAP is initiated that a 24-hour Holter monitor would be completed.

## 2011-01-02 NOTE — Progress Notes (Signed)
Summary: still waiting on cpap  Phone Note Call from Patient Call back at Home Phone [phone]   Caller: Patient Call For: clance Summary of Call: pt has been waiting since 3/5 re: having cpap set up. frustrated. he calle [facility] and was told that "they" were waiting to hear from [facility] nurse with "more info".  Initial call taken by: Tivis Ringer, CNA,  December 23, 2010 2:49 PM  Follow-up for Phone Call        Will forward to [facility]. Follow-up by: Vernie Murders,  December 23, 2010 4:13 PM  Additional Follow-up for Phone Call Additional follow up Details #1::        Lecretia p/u up order and will have [facility] contact pt today to set up appt. for cpap. Rhonda Cobb  December 24, 2010 9:16 AM

## 2011-01-09 ENCOUNTER — Other Ambulatory Visit (INDEPENDENT_AMBULATORY_CARE_PROVIDER_SITE_OTHER): Payer: Medicare Other

## 2011-01-09 DIAGNOSIS — D649 Anemia, unspecified: Secondary | ICD-10-CM

## 2011-01-09 DIAGNOSIS — I10 Essential (primary) hypertension: Secondary | ICD-10-CM

## 2011-01-09 DIAGNOSIS — Z79899 Other long term (current) drug therapy: Secondary | ICD-10-CM

## 2011-01-09 LAB — BASIC METABOLIC PANEL
CO2: 26 mEq/L (ref 19–32)
Calcium: 9.1 mg/dL (ref 8.4–10.5)
Chloride: 108 mEq/L (ref 96–112)
Glucose, Bld: 124 mg/dL — ABNORMAL HIGH (ref 70–99)
Potassium: 5.5 mEq/L — ABNORMAL HIGH (ref 3.5–5.1)
Sodium: 142 mEq/L (ref 135–145)

## 2011-01-09 LAB — VITAMIN B12: Vitamin B-12: 386 pg/mL (ref 211–911)

## 2011-01-13 LAB — DIFFERENTIAL
Basophils Relative: 1 % (ref 0–1)
Eosinophils Absolute: 0.1 10*3/uL (ref 0.0–0.7)
Eosinophils Absolute: 0.1 10*3/uL (ref 0.0–0.7)
Eosinophils Relative: 1 % (ref 0–5)
Eosinophils Relative: 2 % (ref 0–5)
Lymphocytes Relative: 18 % (ref 12–46)
Lymphs Abs: 0.9 10*3/uL (ref 0.7–4.0)
Lymphs Abs: 1.6 10*3/uL (ref 0.7–4.0)
Monocytes Absolute: 0.7 10*3/uL (ref 0.1–1.0)
Monocytes Relative: 12 % (ref 3–12)
Monocytes Relative: 14 % — ABNORMAL HIGH (ref 3–12)
Neutrophils Relative %: 53 % (ref 43–77)
Neutrophils Relative %: 68 % (ref 43–77)

## 2011-01-13 LAB — CBC
HCT: 40.9 % (ref 39.0–52.0)
Hemoglobin: 14 g/dL (ref 13.0–17.0)
MCHC: 34.9 g/dL (ref 30.0–36.0)
MCV: 97.1 fL (ref 78.0–100.0)
MCV: 97.2 fL (ref 78.0–100.0)
RBC: 3.88 MIL/uL — ABNORMAL LOW (ref 4.22–5.81)
RBC: 4.22 MIL/uL (ref 4.22–5.81)
RDW: 14 % (ref 11.5–15.5)
WBC: 5.4 10*3/uL (ref 4.0–10.5)

## 2011-01-13 LAB — POCT CARDIAC MARKERS
CKMB, poc: 1.6 ng/mL (ref 1.0–8.0)
CKMB, poc: 1.9 ng/mL (ref 1.0–8.0)
Myoglobin, poc: 106 ng/mL (ref 12–200)
Myoglobin, poc: 83.6 ng/mL (ref 12–200)
Troponin i, poc: <0.05 ng/mL (ref 0.00–0.09)

## 2011-01-13 LAB — COMPREHENSIVE METABOLIC PANEL
ALT: 21 U/L (ref 0–53)
ALT: 25 U/L (ref 0–53)
AST: 28 U/L (ref 0–37)
AST: 31 U/L (ref 0–37)
Albumin: 3.8 g/dL (ref 3.5–5.2)
Alkaline Phosphatase: 24 U/L — ABNORMAL LOW (ref 39–117)
CO2: 22 mEq/L (ref 19–32)
Calcium: 8.6 mg/dL (ref 8.4–10.5)
Calcium: 8.8 mg/dL (ref 8.4–10.5)
Creatinine, Ser: 0.99 mg/dL (ref 0.4–1.5)
GFR calc Af Amer: >60 mL/min (ref >60)
GFR calc Af Amer: >60 mL/min (ref >60)
GFR calc non Af Amer: >60 mL/min (ref >60)
Glucose, Bld: 93 mg/dL (ref 70–99)
Potassium: 4.3 mEq/L (ref 3.5–5.1)
Sodium: 141 mEq/L (ref 135–145)
Sodium: 141 mEq/L (ref 135–145)
Total Protein: 5.8 g/dL — ABNORMAL LOW (ref 6.0–8.3)
Total Protein: 6.2 g/dL (ref 6.0–8.3)

## 2011-01-13 LAB — ETHANOL: Alcohol, Ethyl (B): 225 mg/dL — ABNORMAL HIGH (ref 0–10)

## 2011-01-21 ENCOUNTER — Encounter: Payer: Self-pay | Admitting: Pulmonary Disease

## 2011-01-22 ENCOUNTER — Ambulatory Visit (INDEPENDENT_AMBULATORY_CARE_PROVIDER_SITE_OTHER): Payer: Medicare Other | Admitting: Pulmonary Disease

## 2011-01-22 ENCOUNTER — Encounter: Payer: Self-pay | Admitting: Pulmonary Disease

## 2011-01-22 VITALS — BP 112/60 | HR 54 | Temp 97.9°F | Ht 67.0 in | Wt 188.8 lb

## 2011-01-22 DIAGNOSIS — G4733 Obstructive sleep apnea (adult) (pediatric): Secondary | ICD-10-CM

## 2011-01-22 NOTE — Progress Notes (Signed)
  Subjective:    Patient ID: Paul Mays, male    DOB: 12-May-1928, 77 y.o.   MRN: [id number]  HPI The pt comes in today for f/u of his known severe osa.  He has been started on cpap, and has been wearing fairly compliantly the first 4 weeks.  He has no issues with mask fit or pressure, but has not seen a great difference in his sleep or daytime alertness.   I have reminded him we have yet to get him to his optimal pressure.    Review of Systems  Constitutional: Negative for fever and unexpected weight change.  HENT: Positive for rhinorrhea. Negative for ear pain, nosebleeds, congestion, sore throat, sneezing, trouble swallowing, dental problem, postnasal drip and sinus pressure.   Eyes: Negative for redness and itching.  Respiratory: Negative for cough, chest tightness, shortness of breath and wheezing.   Cardiovascular: Negative for palpitations and leg swelling.  Gastrointestinal: Negative for nausea and vomiting.  Genitourinary: Negative for dysuria.  Musculoskeletal: Negative for joint swelling.  Skin: Negative for rash.  Neurological: Negative for headaches.  Hematological: Bruises/bleeds easily.  Psychiatric/Behavioral: Negative for dysphoric mood. The patient is not nervous/anxious.        Objective:   Physical Exam Wd male in nad No skin breakdown or pressure necrosis from the cpap mask LE without edema, on cyanosis Appears alert, not sleepy, moves all 4        Assessment & Plan:

## 2011-01-22 NOTE — Assessment & Plan Note (Signed)
The pt is wearing cpap without any significant issues.  At this point, will need to get his pressure to the optimized level.  Will gradually increase to that point, and suspect he will see a difference in his sleep and alertness when we get there.

## 2011-01-22 NOTE — Patient Instructions (Signed)
Will have your pressure gradually increased toward your goal pressure of 17. Please call if having issues with tolerance followup with me in 6mos

## 2011-01-25 ENCOUNTER — Encounter: Payer: Self-pay | Admitting: Internal Medicine

## 2011-01-27 ENCOUNTER — Encounter: Payer: Self-pay | Admitting: Internal Medicine

## 2011-01-27 ENCOUNTER — Ambulatory Visit (INDEPENDENT_AMBULATORY_CARE_PROVIDER_SITE_OTHER): Payer: Medicare Other | Admitting: Internal Medicine

## 2011-01-27 DIAGNOSIS — R350 Frequency of micturition: Secondary | ICD-10-CM

## 2011-01-27 DIAGNOSIS — J309 Allergic rhinitis, unspecified: Secondary | ICD-10-CM

## 2011-01-27 DIAGNOSIS — R7309 Other abnormal glucose: Secondary | ICD-10-CM

## 2011-01-27 DIAGNOSIS — D649 Anemia, unspecified: Secondary | ICD-10-CM

## 2011-01-27 LAB — POCT URINALYSIS DIPSTICK
Bilirubin, UA: NEGATIVE
Glucose, UA: NEGATIVE
Leukocytes, UA: NEGATIVE
Protein, UA: NEGATIVE
Spec Grav, UA: 1.01

## 2011-01-27 NOTE — Patient Instructions (Addendum)
Please complete stool cards. Drink to thirst each day, up to 40 ounces of water daily to prevent dehydration.   Use the Claritin 10 mg (samples) daily as needed for allergy symptoms.

## 2011-01-27 NOTE — Progress Notes (Signed)
Subjective:    Patient ID: Paul Mays, male    DOB: [birth year], 77 y.o.   MRN: [id number]  HPI His blood counts reveal a mild anemia with hematocrit 36.2. His MCV was 103.4 suggesting possible B12 or folate deficiency;however, both folate and B12 levels were normal.He has a past medical history of colitis; has been followed by Dr. Marina Goodell. He denies a history of colon polyps. He believes he is due for followup colonoscopy.    He denies epistaxis, hemoptysis, melena, rectal bleeding, or hematuria. He also denies significant   dyspepsia , but  he bruises easily ; he is on ASA 81 mg daily.                                                                    Chemistries  reveal mild dehydration with a BUN of 35. His GFR was minimally reduced at 56.79. His glucose was 124. He describes nocturia & frequency during the day w/o polydipsia or polyphagia.Both his father & brother had DM.   Review of Systems   He recently has had extrinsic symptoms with sneezing. He has not taken any medication for this.  He denies wheezing or cough.     Objective:   Physical Exam  Gen.: Healthy and well-nourished in appearance. Alert, appropriate and cooperative throughout exam. Appears younger than age  Eyes: No corneal or conjunctival inflammation noted. Pupils equal round reactive to light and accommodation.  Extraocular motion intact. Vision grossly normal. Ears: External  ear exam reveals no significant lesions or deformities.L  Canal clear; wax on R .Nose: External nasal exam reveals no deformity or inflammation. Nasal mucosa are pink and moist. No lesions or exudates noted. Septum w/o deviation  Mouth: Oral mucosa and oropharynx reveal no lesions or exudates. Teeth in good repair. Neck: No deformities, masses, or tenderness noted. Lungs: Normal respiratory effort; chest expands symmetrically. Lungs are clear to auscultation without rales, wheezes, or increased work of breathing. Heart: Normal rate and rhythm.  Normal S1 and S2. No gallop, click, or rub. Grade 2-2.5 systolic murmur murmur.   No clubbing, cyanosis, edema(support hose in place).Nail health  good. Vascular: Carotid, radial artery,  and dorsalis posterior tibial pulses are full and equal. No bruits present. DPP decreased  Neurologic: Alert and oriented x3. Skin: scattered bruising over arms Lymph: No cervical, axillary, or inguinal lymphadenopathy present. Psych: Mood and affect are normal. Normally interactive                                                                                         Assessment & Plan:  #1 anemia, mild with mildly increased MCV but normal B12 and folate levels  #2 colitis, past medical history of   #3 extrinsic rhinoconjunctivitis with sneezing. Exam is essentially negative.  #4 frequency and nocturia  #5 hyperglycemia  Plan: Fecal occult blood cards, iron-binding capacity, repeat CBC and differential, A1c, clean-catch  urinalysis.  Samples of Claritin 10 mg daily as needed.

## 2011-01-27 NOTE — Progress Notes (Signed)
Addended by: Floydene Flock on: 01/27/2011 05:09 PM   Modules accepted: Orders

## 2011-01-28 LAB — CBC WITH DIFFERENTIAL/PLATELET
Basophils Relative: 3.7 % — ABNORMAL HIGH (ref 0.0–3.0)
Eosinophils Absolute: 0.1 10*3/uL (ref 0.0–0.7)
Eosinophils Relative: 2.1 % (ref 0.0–5.0)
HCT: 35.3 % — ABNORMAL LOW (ref 39.0–52.0)
Lymphs Abs: 1.7 10*3/uL (ref 0.7–4.0)
MCHC: 34.5 g/dL (ref 30.0–36.0)
MCV: 104 fl — ABNORMAL HIGH (ref 78.0–100.0)
Monocytes Absolute: 2.4 10*3/uL — ABNORMAL HIGH (ref 0.1–1.0)
Neutro Abs: 1 10*3/uL — ABNORMAL LOW (ref 1.4–7.7)
RBC: 3.4 Mil/uL — ABNORMAL LOW (ref 4.22–5.81)
WBC: 5.4 10*3/uL (ref 4.5–10.5)

## 2011-02-06 ENCOUNTER — Other Ambulatory Visit (INDEPENDENT_AMBULATORY_CARE_PROVIDER_SITE_OTHER): Payer: Medicare Other

## 2011-02-06 DIAGNOSIS — Z1211 Encounter for screening for malignant neoplasm of colon: Secondary | ICD-10-CM

## 2011-02-06 LAB — HEMOCCULT GUIAC POC 1CARD (OFFICE)
Card #2 Fecal Occult Blod, POC: NEGATIVE
Fecal Occult Blood, POC: NEGATIVE

## 2011-02-18 NOTE — Assessment & Plan Note (Signed)
[facility]                            CARDIOLOGY OFFICE NOTE   Kelsen, Celona AIDAN MOTEN                       MRN:          [id number]  DATE:09/13/2008                            DOB:          Jan 31, 1928    REASON FOR VISIT:  CAD.   HISTORY OF PRESENT ILLNESS:  Mr. Marcy is a 77 year old gentleman with  coronary artery disease and previous stenting back in 1999.  He has done  very well ever since his angioplasty procedure.  He denies any chest  pain, dyspnea, edema, orthopnea, PND, or fatigue.  He really feels well  and continues with an active lifestyle.  He has no complaints at  present.   MEDICATIONS:  1. Gabapentin 300 mg 2-4 daily.  2. Terazosin 5 mg at bedtime.  3. Colchicine 0.6 mg at bedtime.  4. Asacol 400 mg b.i.d. to t.i.d.  5. Metoprolol 25 mg b.i.d.  6. Aspirin 81 mg daily.  7. Lisinopril 20 mg twice daily.  8. Pravastatin 40 mg at bedtime.   ALLERGIES:  NKDA.   PHYSICAL EXAMINATION:  GENERAL:  The patient is alert and oriented in no  acute distress.  VITAL SIGNS:  Weight 183 pounds, blood pressure 150/74, heart rate 66,  and respiratory rate 12.  HEENT:  Normal.  NECK:  JVP normal.  Normal carotid upstrokes with a right carotid bruit.  LUNGS:  Clear bilaterally.  HEART:  Regular rate and rhythm with a 3/6 systolic ejection murmur  along the right upper sternal border.  A2 component is preserved.  ABDOMEN:  Soft, nontender, and no organomegaly.  EXTREMITIES:  No clubbing, cyanosis, or edema.  Peripheral pulses are  intact and equal.   ASSESSMENT:  1. Coronary artery disease.  The patient has no angina.  EKG in the      office today showed normal sinus rhythm with first-degree      atrioventricular block and a nonspecific T-wave abnormality.  It is      unchanged and there are no suggestions of ischemia.  Continue      current medical therapy with aspirin, statin, and beta-blocker.      Followup in 6 months.  2. Asymptomatic  carotid stenosis.  The patient has mild-to-moderate      bilateral carotid stenosis in the range of 40-59%.  Followup      carotid ultrasound was done on September 07, 2008 and showed stable      mild plaque bilaterally.  We will follow up in 1 year.  3. Mild aortic stenosis.  This was demonstrated on echocardiogram 1      year ago and his physical exam is compatible with this.  Continue      clinical followup.  4. Hypertension.  Blood pressure elevated today.  Past readings have      all been in the normal range.  He rushed over from the gym.  We      will follow this along as an outpatient.   For followup, I will see him back in 6 months.     Veverly Fells. Excell Seltzer, MD  Electronically Signed    MDC/MedQ  DD: 09/13/2008  DT: 09/14/2008  Job #: [id number]   cc:   Titus Dubin. Alwyn Ren, MD,FACP,FCCP

## 2011-02-18 NOTE — Assessment & Plan Note (Signed)
[name]                            CARDIOLOGY OFFICE NOTE   NAME:Paul Mays, Paul Mays                       MRN:          [id number]  DATE:09/21/2007                            DOB:          10-Jul-1928    Paul Mays was seen in followup at the [facility] Cardiology  office on September 21, 2007.  Paul Mays is a very nice 77 year old  gentleman with coronary artery disease and previous PCI of the left  circumflex and right coronary arteries.  He also has hypertension and  hyperlipidemia.  He continues to do very well from a symptomatic  standpoint.  He remains very active.  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  He has no cardiovascular symptoms.  He specifically denies chest pain, dyspnea, edema, palpitations,  lightheadedness, orthopnea, or PND.  He is tolerating his medical  therapy well without complaints.  His biggest problem has been related  to peripheral neuropathy, but he has had some improvement with  gabapentin.   CURRENT MEDICATIONS:  1. Gabapentin 300 mg 2 t.i.d.  2. Terazosin 5 mg at bedtime.  3. Colchicine 0.6 mg at bedtime.  4. Asacol 400 mg b.i.d. to t.i.d.  5. Metoprolol 25 mg twice daily.  6. Aspirin 81 mg daily.  7. Lisinopril 20 mg twice daily.  8. Furosemide 40 mg as needed.  9. Pravastatin 40 mg at bedtime.   ALLERGIES:  NKDA.   PHYSICAL EXAMINATION:  Patient is alert and oriented.  He is in no acute  distress.  Weight is 195.  Blood pressure is 130/70.  Heart rate 46.  Respiratory  rate 16.  HEENT:  Normal.  NECK:  Normal carotid upstrokes with bilateral bruits.  LUNGS:  Clear to auscultation bilaterally.  HEART:  The heart is bradycardic and regular with a 2/6 mid peaking  systolic ejection murmur along the left sternal border.  ABDOMEN:  Soft, obese, nontender.  No organomegaly.  EXTREMITIES:  No clubbing or cyanosis.  There is trace pretibial edema.  Peripheral pulses are 2+ and  equal throughout.   Studies reviewed including a lipid panel from June 9th that showed a  total cholesterol of 181, triglycerides 72, HDL 55, LDL 111.   His Adenosine Myoview study from October 21 showed no sign of scar or  ischemia.   His transthoracic echo from June of this year showed normal LV systolic  function with mild calcification of the aortic valve and moderate  asymmetric septal hypertrophy.   Finally, his carotid ultrasound from November 19th showed mild-to-  moderate bilateral carotid disease with 40-59% range of internal carotid  stenoses.   EKG from today showed marked sinus bradycardia with first-degree AV  block, otherwise within normal limits.   ASSESSMENT:  1. Coronary artery disease.  He remains asymptomatic.  Normal stress      Myoview, as detailed above.  Continue current medical therapy with      aspirin, metoprolol, and aggressive secondary risk reduction.  Of      note, his resting heart rate is low, but  he is completely      asymptomatic.  I did not alter his metoprolol dose today.  2. Dyslipidemia:  Lipids are not at goal.  When I saw him last, he was      on Lovastatin, and he is now taking pravastatin.  We will repeat a      lipid panel.  He prefers a generic statin.  It may be in his      benefit to switch him to simvastatin to get him to goal LDL of less      than 100.  We will make further plans pending his lipid panel      today.  3. Hypertension:  Blood pressure is under optimal control on a      combination of metoprolol and lisinopril.  4. Carotid stenosis:  Continue medical therapy with follow-up carotid      ultrasound in one year.   For followup, I would like to see Paul Mays back in six months or sooner  if any new problems arise.     Veverly Fells. Excell Seltzer, MD  Electronically Signed    MDC/MedQ  DD: 09/21/2007  DT: 09/21/2007  Job #: [id number]

## 2011-02-18 NOTE — Discharge Summary (Signed)
NAME:  Paul Mays, Paul Mays NO.:  [id number]   MEDICAL RECORD NO.:  [id number]          PATIENT TYPE:  INP   LOCATION:  6529                         FACILITY:  [facility]   PHYSICIAN:  Gordy Savers, MDDATE OF BIRTH:  1928-07-25   DATE OF ADMISSION:  03/12/2009  DATE OF DISCHARGE:  03/13/2009                               DISCHARGE SUMMARY   FINAL DIAGNOSIS:  Mild closed head injury with subarachnoid hemorrhage.   ADDITIONAL DIAGNOSES:  1. Hypertension.  2. Dyslipidemia.  3. Coronary artery disease.  4. History of ethanol.  5. Aortic stenosis.  6. Ulcerative proctitis.   DISCHARGE MEDICATIONS:  1. Mesalamine 400 mg t.i.d.  2. Pravastatin 1 daily.  3. Lisinopril 1 daily.  4. Gabapentin 300 mg t.i.d.   The patient has been asked to hold aspirin therapy for 2 weeks.   HISTORY OF PRESENT ILLNESS:  The patient is a 76 year old patient who  fell and struck his head and subsequently presented to the ED for  evaluation.  The patient was initially evaluated in the ED with a CT  scan of the head that revealed a very small subarachnoid bleeding.  He  was also admitted for further observation through the telemetry setting.  The patient had been on aspirin therapy for his cardiac disease.  Laboratory studies were fairly unremarkable.  Hemoglobin 13.1,  hematocrit 37.7, white count was 5.1, and platelet count depressed at  98,000.  Chemistries were unremarkable.  Cardiac enzymes were negative.  Alcohol level was 225.  CT of the spine was obtained that was negative.  The patient had a followup head CT without contrast on the second  hospital day.  This again revealed a very small focus of subarachnoid  hemorrhage in the superior and anterior aspect of the anterior  hemispheric fissure.  There is no parenchymal hemorrhage.  A large left  posterior scalp hematoma also noted.  There was felt to be no  significant change in the very mild focal subarachnoid bleed.   Clinically, the patient did well.  On second hospital day, he was alert  with normal speech, ambulatory, and had only very mild scalp tenderness  only.   DISPOSITION:  The patient was discharged to follow up with his primary  care Paul Mays.  He is also scheduled for a cardiology followup in 1  week.  He has been asked to hold his aspirin for 2 weeks and will resume  all his preadmission medications.  Excess alcohol use discussed.   CONDITION ON DISCHARGE:  Stable.      Gordy Savers, MD  Electronically Signed     PFK/MEDQ  D:  03/13/2009  T:  03/14/2009  Job:  [id number]

## 2011-02-18 NOTE — Discharge Summary (Signed)
NAME:  Paul, Mays NO.:  [id number]   MEDICAL RECORD NO.:  [id number]          PATIENT TYPE:  INP   LOCATION:  6529                         FACILITY:  [facility]   PHYSICIAN:  Gabrielle Dare. Janee Morn, M.D.DATE OF BIRTH:  06/11/1928   DATE OF ADMISSION:  03/12/2009  DATE OF DISCHARGE:  03/13/2009                               DISCHARGE SUMMARY   DISCHARGE DIAGNOSES:  1. Fall.  2. Traumatic brain injury with subarachnoid hemorrhage.  3. Coronary artery disease.  4. Hypertension.  5. Alcohol use.  6. Aortic stenosis.  7. Ulcerative proctitis.   CONSULTANTS:  Dr. Gerlene Fee for Neurosurgery and Dr. Adela Glimpse for [facility].   PROCEDURE:  None.   HISTORY OF PRESENT ILLNESS:  This is a 77 year old white male who  slipped and fell on his stairs and hit back of his head on a concrete  planter.  He came in as a level II trauma.  CT scan showed a small  amount of subarachnoid hemorrhage.  He was admitted for observation and  repeat head CT in the morning.   HOSPITAL COURSE:  The patient did well overnight in the hospital.  He  was a little lightheaded initially upon getting up but by the time he  was mobilized with physical and occupational therapy, he was having no  difficulty.  He required no supplemental pain medication while he was  here and repeat head CT did not show any extension of the subarachnoid  hemorrhage.  He was cleared for discharge both from Medicine and  Neurosurgical Services and he was sent there in good condition.   DISCHARGE MEDICATIONS:  The patient is to resume his home medications  which include:  1. Neurontin 900 mg 3 times daily.  2. Lisinopril 20 mg twice daily.  3. Pravastatin 40 mg daily.  4. Mesalamine 400 mg 3 times a day.  5. Colchicine 0.6 mg at bedtime p.r.n.  6. Terazosin 5 mg at bedtime.  He is to stop his aspirin for a period of 2 weeks.  He may take over-the-  counter Tylenol for any pain.   FOLLOWUP:  The patient  will need to follow up with his primary care  Paul Mays but otherwise follow up with Neurosurgery and with Trauma will  be on an as-needed basis.      Paul Mays, P.A.      Gabrielle Dare Janee Morn, M.D.  Electronically Signed    MJ/MEDQ  D:  03/13/2009  T:  03/14/2009  Job:  [id number]   cc:   Titus Dubin. Alwyn Ren, MD,FACP,FCCP  Reinaldo Meeker, M.D.

## 2011-02-18 NOTE — Assessment & Plan Note (Signed)
[facility]                            CARDIOLOGY OFFICE NOTE   NAME:Paul Mays                       MRN:          [id number]  DATE:03/17/2008                            DOB:          1928-05-06    Paul Mays was seen in followup at the [facility] Cardiology Office on March 17, 2008.  He is a 77 year old gentleman with coronary artery disease  and prior percutaneous coronary intervention.  He has done very well  from symptomatic standpoint since I have been following him over the  past year.  He denies chest pain, dyspnea, palpitations, orthopnea, or  PND.  He is moderately active and has no symptoms with exertion.  He  does not participate in regular exercise.   MEDICATIONS:  1. Gabapentin 300 mg 3-4 times daily.  2. Terazosin 5 mg at bedtime.  3. Colchicine 0.6 mg at bedtime.  4. Asacol 400 mg 2-3 times daily.  5. Metoprolol 50 mg one-half twice daily.  6. Aspirin 81 mg daily.  7. Lisinopril 20 mg twice daily.  8. Pravastatin 40 mg at bedtime.   ALLERGIES:  NKDA.   PHYSICAL EXAMINATION:  The patient is alert and oriented.  He is in no  acute distress.  Weight is 191 pounds, blood pressure 128/66, heart rate  is 49, and respiratory rate 16.  HEENT:  Normal.  NECK:  Normal carotid upstrokes with bilateral bruits.  Jugular venous  pressure is normal.  LUNGS:  Clear to auscultation bilaterally.  HEART:  Regular rate and rhythm with a 2/6 systolic ejection murmur best  heard at the right upper sternal border.  No gallops or diastolic  murmurs.  ABDOMEN:  Soft, nontender.  No organomegaly.  EXTREMITIES:  No clubbing, cyanosis, or edema.  Peripheral pulses are  intact and equal.   EKG shows sinus bradycardia with first-degree AV block and nonspecific T-  wave abnormality.   ASSESSMENT:  1. Coronary artery disease.  The patient is asymptomatic.  Continue      medical therapy with aggressive secondary risk reduction.  Myoview      stress  study from October 2008, showed no sign of scar or ischemia      with preserved left ventricular function and a gated left      ventricular ejection fraction of 62%.  2. Dyslipidemia.  Lipids from December 2008 showed cholesterol of 161,      triglycerides 52, HDL 58, and LDL 93.  Continue current therapy.  3. Hypertension.  Continue combination of metoprolol and lisinopril.  4. Carotid stenosis.  Carotid ultrasound November 2008 showed 40% to      59% bilateral internal carotid artery stenosis.  He will be due for      followup this fall for a 1-year study.   For clinical followup, I would like to see Ace Gins again in 6 months.     Paul Mays. Excell Seltzer, MD  Electronically Signed    MDC/MedQ  DD: 03/20/2008  DT: 03/20/2008  Job #: [id number]   cc:   Paul Mays. Paul Ren, MD,FACP,FCCP

## 2011-02-18 NOTE — Assessment & Plan Note (Signed)
[facility]                            CARDIOLOGY OFFICE NOTE   NAME:Arana, Paul Mays                       MRN:          [id number]  DATE:03/19/2007                            DOB:          1928-03-17    Ellene Route Goins was seen in followup at the [facility] Cardiology  Office on March 19, 2007.  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  He has undergone previous PCI in the left  circumflex and right coronary artery territories by Dr. Juanda Chance back in  1999.  From a symptomatic standpoint, he has done very well in the  interim.  Mr. Tejada presents today for followup of his coronary artery  disease.  He has remained active.  He does not participate in regular  exercise, but he walks his dog, goes up and down stairs frequently, and  is fairly active.  He denies any chest pain, dyspnea,  orthopnea, PND,  claudication symptoms, edema, lightheadedness, or syncope.   His medications are unchanged, and include:  1. Lovastatin 40 mg daily.  2. Gabapentin 300 mg 3 times daily.  3. Terazosin 5 mg at bedtime.  4. Colchicine 0.6 mg at bedtime.  5. Asacol 400 mg b.i.d. to t.i.d.  6. Metoprolol 50 mg 1/2 twice daily.  7. Aspirin 81 mg daily.  8. Lisinopril 20 mg twice daily.  9. Lasix 40 mg as needed.   ALLERGIES:  NO KNOWN DRUG ALLERGIES.   PHYSICAL EXAMINATION:  The patient is alert and oriented.  He is no  acute distress.  He appears younger than his stated age.  Weight is 191 pounds.  Blood pressure 122/62 in the right arm, 120/62 in  the left arm.  Heart rate is 60.  Respiratory rate 12.  HEENT:  Normal.  NECK:  Normal carotid upstrokes without bruits.  Jugular venous pressure  is normal.  No thyromegaly or thyroid nodules.  LUNGS:  Clear to auscultation bilaterally.  HEART:  Regular rate and rhythm with a 2/6 early to mid peaking systolic  ejection murmur along the left sternal border.  No gallops or diastolic  murmurs  are present.  ABDOMEN:  Soft and non-tender.  No organomegaly.  No bruits.  EXTREMITIES:  There is trivial bilateral pretibial edema.  Peripheral  pulses are 2+ and equal throughout.   Recent lab work shows a sodium of 146, potassium 4.6, creatinine 1.4.  Cholesterol 181, triglycerides 72, HDL 55, LDL 111.   Echocardiogram demonstrates normal LV systolic function with moderate  asymmetric septal hypertrophy.  The aortic valve has mild calcification.  The valve is not stenotic, as the valve area is 3.2 cm.  This is  consistent with aortic sclerosis.  The left atrium was mildly dilated.   ASSESSMENT:  Mr. Ozment is currently stable from a cardiovascular  standpoint.  His cardiac issues are as follows:  1. Coronary artery disease.  He is doing well on aspirin and medical      therapy at present.  He is asymptomatic.  He was having infrequent  symptoms prior to his angioplasty and stenting back in 1999.  We      will consider stress testing in the future, as it has been some      time.  Although, at this point I am inclined to perform a stress      study with his lack of symptoms.  2. Hypertension.  Blood pressure is ideal on combination of lisinopril      and metoprolol.  3. Dyslipidemia.  He is tolerating lovastatin well.  His lipids are      not at goal.  At the time of his return visit, we will review      options, and consider change to a more potent statin.  4. Carotid arterial disease.  He has had mild carotid stenosis, and 2-      year followup is recommended.  He will be due for his repeat      carotid studies in November 2008.  We will check them at the time      of his return visit in 6 months.     Veverly Fells. Excell Seltzer, MD  Electronically Signed    MDC/MedQ  DD: 03/19/2007  DT: 03/20/2007  Job #: [id number]   cc:   Titus Dubin. Alwyn Ren, MD,FACP,FCCP

## 2011-02-18 NOTE — Consult Note (Signed)
NAME:  Paul Mays, Paul Mays NO.:  [id number]   MEDICAL RECORD NO.:  [id number]          PATIENT TYPE:  INP   LOCATION:  1824                         FACILITY:  [facility]   PHYSICIAN:  Michiel Cowboy, MDDATE OF BIRTH:  [birth year]   DATE OF CONSULTATION:  DATE OF DISCHARGE:                                 CONSULTATION   PRIMARY CARE PHYSICIAN:  Dr. Alwyn Ren.   REQUESTING PHYSICIAN:  Cherylynn Ridges, M.D. with trauma surgery.   REASON FOR CONSULTATION:  History of hypertension and coronary artery  disease.   Paul Mays is a 77 year old gentleman with a history of coronary artery  disease, status post stenting in 1999 followed by [facility].  Per the notes and per patient has been very stable since then.  Has not  had any history of chest pain or shortness of breath since his stenting.  Has been actively exercising and had a very active lifestyle.  Patient  does have a history of hypertension for which he is on lisinopril and  metoprolol.  Unfortunately, patient is currently unable to tell me his  medications in detail but per the last cardiology note that was in  December, 2009, he was taking metoprolol 25 mg b.i.d. and lisinopril  twice daily.  His blood pressure initially when he presented to the ED  after he had just fallen was 180/84, now it is down to 110/51.  Of note,  patient has been reported drinking about 2 glasses of wine a day and has  been drinking more today.  He says that he drank about 3.  He has fallen  a flight of stairs and hit the back of his head.  Unfortunately, he is  still somewhat intoxicated and has a hard time telling me complete story  but states that he did not lose consciousness.  He did have a small  laceration to the back of his head, and the CT scan showed a small  subarachnoid bleed.  Trauma surgery has kindly agreed to admit the  patient to his service.  Medicine is to consult and help continue  managing him.  The patient at this  point is not interested in quitting  alcohol.  He denies any history of ever having alcohol withdrawal, and I  have no history of admissions for such in the past.   REVIEW OF SYSTEMS:  No chest pain, no shortness of breath, no fevers, no  chills, no diarrhea, no constipation.  Otherwise review of systems has  been fairly negative.   PAST MEDICAL HISTORY:  1. Coronary artery disease, status post stenting in 1999.  Has been      stable ever since.  2. Neuropathy.  3. Gout.  4. Aortic stenosis with systolic murmur.  5. Dyslipidemia.  6. Hypertension.  7. Carotid stenosis.  8. History of ulcerative proctitis.  9. Cluster headaches.  10.Possible history of BPH.   SOCIAL HISTORY:  Patient does not smoke.  Does drink 3 to 4 wine glasses  per day.  His family is not around to confirm this.  Not interested in  discontinuation  of his alcohol at this time.  Does not think it is a big  problem.  Denies ever having any symptoms of withdrawal.   FAMILY HISTORY:  Significant for gout, stroke, diabetes, coronary artery  disease in his family.  He is currently unable to provide me more  details, so this is obtained from the records.   MEDICATIONS:  1. Colchicine 0.6 mg p.o. at bedtime as needed.  2. Asacol.  Not quite sure about the dose.  Per records, it is 400 mg      2 to 3 times a day but perhaps he had been taking 2 to 3 tabs 3      times a day.  3. Metoprolol 25 mg p.o. twice daily.  4. Aspirin 81 mg once daily.  5. Terazosin 5 mg p.o. at bedtime.  6. Lisinopril 20 mg twice daily.  7. Neurontin 300 mg p.o. 3 times a day.  8. Pravastatin 40 mg p.o. daily.   ALLERGIES:  No known drug allergies.   PHYSICAL EXAMINATION:  VITALS:  Temperature 99.5, blood pressure 184/80,  now down to 110/57.  Pulse 63.  Respirations 12.  Satting at 96% on room  air.  Patient appears to be currently in no acute distress.  HEENT:  There is a small laceration to the back of his head with some  contusion  and swelling, otherwise unremarkable.  Moist mucous membranes.  LUNGS:  Clear to auscultation bilaterally.  HEART:  There is a 3/6 systolic murmur, unchanged.  Otherwise  unremarkable.  ABDOMEN:  Soft, nontender, nondistended.  No organomegaly present.  LOWER EXTREMITIES:  No clubbing, cyanosis or edema.  NEUROLOGIC:  Patient appears to be grossly intact.  SKIN:  Appears to be intact except for the head.  __________.   LABS:  White blood cell count 5.4, hemoglobin 14.  Sodium 141, potassium  4.3, creatinine 1.12, INR 1.  Total bilirubin is slightly elevated at  1.4, albumin 3.8.  Cardiac enzymes unremarkable.   EKG showing normal sinus rhythm with third degree AV block.  Heart rate  is 64.  There is some T wave inversion in lead 3, which is old,  otherwise no ischemic changes.  EKG unchanged from prior.   CT scan of the head showing tiny subarachnoid hemorrhage, close to  interhemispheric fissures.   CT scan of the neck is unremarkable.   ASSESSMENT/PLAN:  This is a 77 year old male with a history of coronary  artery disease and hypertension who presents with fall being intoxicated  with a slight subarachnoid, who is admitted to the trauma surgery  service.  [facility] called for a consult in regards to his  blood pressure and coronary artery disease.  1. Coronary artery disease history, very stable.  Continue his statin      and metoprolol with holding parameters.  Given slightly low blood      pressure will hold lisinopril for now.  Hold aspirin, as he is      having small subarachnoid hemorrhage.  2. Hypertension:  This is resolved, per old records.  His blood      pressure is under fairly good control.  Will control metoprolol      holding parameters.  If blood pressure goes up, would restart      lisinopril.  Since it is a little soft right now, will hold.  3. Alcohol intoxication:  I am not sure if this is at a degree of      abuse or is  just a one-time thing.  We  will start him on      multivitamins, give thiamine 2 gm at bedside.  Would need to obtain      more history from the family.  Watch for signs of withdrawal,      although patient denies ever having withdrawal.  4. Aortic stenosis:  Stable.  5. History of ulcerative proctitis:  Continue mesalamine.  Would      confirm dose from home.  6. Prophylaxis:  Continue Protonix and SCDs.      Michiel Cowboy, MD  Electronically Signed     AVD/MEDQ  D:  03/12/2009  T:  03/13/2009  Job:  [id number]   cc:   Cherylynn Ridges, M.D.  Dr. Alwyn Ren

## 2011-02-21 NOTE — Assessment & Plan Note (Signed)
[facility]                              CARDIOLOGY OFFICE NOTE   Paul Mays, Paul Mays                       MRN:          [id number]  DATE:05/05/2006                            DOB:          [birth year]    Paul Mays is a very pleasant 77 year old, white, married male with known  coronary artery disease, previous percutaneous intervention of the obtuse  marginal of the circumflex and the right coronary artery.  The patient quite  stable with no shortness of breath, chest pain, or palpitations.  He has  mild aortic sclerosis by echocardiogram and normal EF.  He has mild aortic  regurgitation.  He has recently had some problems with peripheral neuropathy.   MEDICATIONS:  He is on:  1.  Lovastatin 20.  2.  Terazosin 5.  3.  Lisinopril 20 b.i.d.  4.  Mesalamine.  5.  Metoprolol 25 b.i.d.  6.  Colchicine 0.6.  7.  Verelan 200.  8.  Aspirin 81.  9.  Gabapentin.   PHYSICAL EXAMINATION:  VITAL SIGNS:  Blood pressure 148/72, pulse 47 sinus  bradycardia.  GENERAL APPEARANCE:  Normal.  NECK:  JVP is not elevated.  Carotid pulses palpable without bruits.  LUNGS:  Clear.  HEART:  Normal.  ABDOMEN:  Normal.  EXTREMITIES:  No edema.   EKG was sinus bradycardia.  Nonspecific T abnormalities somewhat improved  from the most recent EKG of March 14.   IMPRESSION:  1.  Coronary artery disease, as above, asymptomatic.  2.  Hypertension, fairly controlled.  3.  Hyperlipidemia.  4.  Peripheral neuropathy.  5.  Mild aortic sclerosis and mild aortic regurgitation.   I have suggested the patient continue on same therapy.  We plan to get LFTs  and a BMP in the near future.  I'll see him back in _________ or p.r.n.   ADDENDUM:  Mr. Borges had a recent renal profile revealing BUN of 27,  creatinine 1.6.  Will plan to repeat this on return in 4 months.   Cecil Cranker, MD, [facility]   Job# [id number]  EJL/MedQ  DD:  05/05/2006  DT:  05/05/2006  Job #:  [id number]

## 2011-02-21 NOTE — Letter (Signed)
January 06, 2007    Stefani Dama, M.D.  [address]  [address]  [place], [place] [address]   RE:  Paul Mays, Paul Mays  MRN:  [id number]  /  DOB:  August 04, 1928   Dear Paul Mays,   Paul Mays has requested referral to you for radicular symptoms in the  left upper extremity. For approximately a year, he has had some mid back  discomfort which radiates down the left upper extremity focusing mainly  in the index and third digit in the left hand. He occasionally he will  notice symptoms in the thumb as well. Intermittently he has also noticed  locking of his digits. He does get some relief with Aleve but none with  arthritis strength Tylenol.   PAST MEDICAL HISTORY:  Angioplasty with 2 stents in 1999. He has had a  tonsillectomy, hemorrhoidectomy and a lipoma removed. Paul Mays  performed a diskectomy in 1982.   MEDICAL PROBLEMS:  1. Dyslipidemia.  2. Gout.  3. Ulcerative proctitis.  4. Cluster headaches.  5. Valvular heart disease.   FAMILY HISTORY:  Positive for gout, stroke, diabetes, coronary artery  disease.   He is a remote smoker.   He developed myalgias while on Zocor and gout on hydrochlorothiazide.   CURRENT MEDICATIONS:  1. Colchicine 0.6 mg daily.  2. Asacol 400 mg 2-3 times a day.  3. Metoprolol 50 mg 1/2 twice a day.  4. Baby aspirin 81 mg daily.  5. Terazosin 5 mg at bedtime.  6. Lisinopril 20 mg twice a day.  7. Gabapentin 300 mg every 8 hours as needed.  8. Pravastatin 40.   Weight is up approximately 9 pounds to 198. Pulse was 60 and regular,  blood pressure 138/80.   Range of motion and strength appeared grossly normal. He does have a  surgical deficit over the left forearm related to the lipomaectomy.   Deep tendon reflexes were normal. He has no lymphadenopathy about the  head, neck or axilla.   He appears to have a C5, C6-7 radiculopathy. I will perform MRI of the  cervical spine to evaluate this.   You see his wife, Paul Mays. His daughter, Paul Mays, is  the head of the  Department of Neurology at [facility] in [place].   I will send copies of the MRI once these have been completed.    Sincerely,      Paul Mays. Alwyn Ren, MD,FACP,FCCP  Electronically Signed    [facility]/MedQ  DD: 01/06/2007  DT: 01/06/2007  Job #: [id number]

## 2011-02-21 NOTE — Assessment & Plan Note (Signed)
[facility]                            CARDIOLOGY OFFICE NOTE   Eliyahu, Bille GILDARDO TICKNER                       MRN:          [id number]  DATE:01/26/2007                            DOB:          October 01, 1928    HISTORY OF PRESENT ILLNESS:  Mr. Batalla is a very pleasant 77 year old  white married male with known coronary artery disease, previous  percutaneous intervention of the obtuse marginal branch of the  circumflex and the RCA per Dr. Juanda Chance. The patient has been quite  stable. There are no recent cardiac symptoms. He has no chest pain,  palpitations, or shortness of breath. He has had history of cervical  disk surgery many years ago by Dr. Vear Clock and recently has noted  tingling in both hands and fatigue in his legs when he walks. I should  note that he has a history of hypertension and hyperlipidemia. Dr.  Juanda Chance, at the time of intervention, utilized stents in the marginal  branch of the circumflex and distal RCA. These were Duet stents.   MEDICATIONS:  Lovastatin 40, Lisinopril 20 mg, which has been reduced  from 20 bid, Gabapentin 300, Terazosin 5 at bedtime, colchicine 0.6,  Asacol, Metoprolol 25 bid, aspirin 81.   PHYSICAL EXAMINATION:  VITAL SIGNS:  Blood pressure 140/74, pulse 56.  Sinus  bradycardia.  GENERAL:  Normal appearance.  NECK:  JVP is not elevated. Carotid pulses are palpable and equal.  Bilater Short bruits.  LUNGS:  Clear.  CARDIOVASCULAR:  Examination reveals a 2 to 3 over 6 moderate length  systolic ejection murmur. This seems louder than previously noted. No  diastolic murmur.  ABDOMEN:  Obese.  EXTREMITIES:  No edema. Easily palpable pulses.   LABORATORY/DIAGNOSTIC STUDIES:  I should note that the most recent  echocardiogram revealed normal LV function with a mean aortic peak  velocity of 189, peak AV greater than 14, and mean of 8.   EKG revealed some sinus bradycardia, first degree AV block. Otherwise,  normal.   DIAGNOSIS:  As above.   DISPOSITION:  The patient has an appointment to see Dr. Danielle Dess in 6  weeks. However, he is quite concerned about this and I have talked to  [name], who he has seen before and he will plan to see him on the 28th  for evaluation. Concerning his cardiac status, we plan to get a followup  lipid, BMP, and a 2-D echo in June. I will have the patient to followup  with Dr. Calton Dach in 3 to 4 months or p.r.n.     E. Graceann Congress, MD, [facility]  Electronically Signed   EJL/MedQ  DD: 01/26/2007  DT: 01/26/2007  Job #: [id number]

## 2011-02-21 NOTE — Assessment & Plan Note (Signed)
[facility]                              CARDIOLOGY OFFICE NOTE   Paul Mays, Paul Mays                       MRN:          [id number]  DATE:05/05/2006                            DOB:          [birth year]    ADDENDUM   Paul Mays had a recent renal profile revealing BUN of 27, creatinine 1.6.  Will plan to repeat this on return in 4 months.                              Cecil Cranker, MD, [facility]    EJL/MedQ  DD:  05/05/2006  DT:  05/05/2006  Job #:  [id number]

## 2011-02-21 NOTE — Assessment & Plan Note (Signed)
[facility]                            CARDIOLOGY OFFICE NOTE   Paul Mays, Paul Mays                       MRN:          [id number]  DATE:09/08/2006                            DOB:          1928-04-12    Paul Mays is a very pleasant 77 year old white married male with known  coronary artery disease, previous percutaneous intervention of the  obtuse marginal, branch of the circumflex and the right coronary artery  per Dr. Juanda Chance.  The patient has been quite stable with no shortness of  breath, chest pain or palpitations.  He has mild aortic sclerosis by  echo, with a normal EF.  There was also mild aortic regurgitation.  He  has had some symptoms of peripheral neuropathy.   In addition, he has history of hypertension, hyperlipidemia.   MEDICATIONS:  1. Lovastatin 40.  2. Lisinopril 20 b.i.d.  3. Gabapentin 300 t.i.d.  4. Terazosin 5.  5. Colchicine 0.6.  6. Asacol 400 two to three times daily.  7. Metoprolol 25 b.i.d.  8. Aspirin 81.   He had recent creatinine 1.4, BUN 26 and potassium 4.3.   PHYSICAL EXAMINATION:  Blood pressure 134/74, pulse 57, sinus  bradycardia.  GENERAL APPEARANCE:  Normal.  JVP is not elevated.  Carotid pulses are  palpable and equal, short bruit of the right.  LUNGS:  Clear.  CARDIAC:  Exam reveals 1/6 short systolic ejection murmur in the aortic  area, no diastolic murmur.  ABDOMEN:  Exam is normal.  Liver, spleen and kidney not palpable.  EXTREMITIES:  Trace edema.   IMPRESSION:  Diagnoses as above.   DISPOSITION:  We plan to remain on the same therapy.  We discussed  increasing his aspirin.  He is to have a followup 2D echo, in another  year he will have carotids.   He should have a followup lipid profile and LFTs.  I will see him back  in three to four months.  Thereafter we will have him return to see Dr.  Juanda Chance.    Paul Cranker, MD, [facility]  Electronically Signed   EJL/MedQ  DD: 09/08/2006  DT:  09/09/2006  Job #: [id number]

## 2011-03-07 ENCOUNTER — Encounter: Payer: Self-pay | Admitting: Cardiovascular Disease

## 2011-03-24 ENCOUNTER — Encounter: Payer: Self-pay | Admitting: Cardiovascular Disease

## 2011-03-24 ENCOUNTER — Ambulatory Visit (INDEPENDENT_AMBULATORY_CARE_PROVIDER_SITE_OTHER): Payer: Medicare Other | Admitting: Cardiovascular Disease

## 2011-03-24 DIAGNOSIS — I2789 Other specified pulmonary heart diseases: Secondary | ICD-10-CM

## 2011-03-24 DIAGNOSIS — I422 Other hypertrophic cardiomyopathy: Secondary | ICD-10-CM

## 2011-03-24 DIAGNOSIS — I2510 Atherosclerotic heart disease of native coronary artery without angina pectoris: Secondary | ICD-10-CM

## 2011-03-24 NOTE — Assessment & Plan Note (Signed)
The patient has begun wearing CPAP. He understands the importance of this and that his pulmonary hypertension may be related to both severe obstructive sleep apnea and left ventricular diastolic dysfunction related to his hypertrophic cardiomyopathy.

## 2011-03-24 NOTE — Progress Notes (Signed)
HPI:  This is an 77 year old gentleman presenting for followup evaluation. The patient has a history of coronary artery disease status post remote PCI procedure. He has had no angina for several years. He has chronic dyspnea with exertion, unchanged over time. He has been noted to have asymmetric LV septal hypertrophy with an outflow tract gradient. He denies lightheadedness or syncope. He denies chest pain or chest pressure. He admits to bilateral leg edema. He denies orthopnea or PND. He remains active and he gets out on a daily basis to walk his dog and do other activities.  Outpatient Encounter Prescriptions as of 03/24/2011  Medication Sig Dispense Refill  . allopurinol (ZYLOPRIM) 300 MG tablet Take 300 mg by mouth daily.        Marland Kitchen aspirin 81 MG tablet Take 81 mg by mouth daily.        . Cholecalciferol (VITAMIN D3) 1000 UNITS CAPS Take 1,000 Units by mouth.        . Coenzyme Q10 (COQ10) 100 MG CAPS Take 100 mg by mouth daily.        Marland Kitchen COLCRYS 0.6 MG tablet As directed      . furosemide (LASIX) 40 MG tablet Take 40 mg by mouth as needed. For swelling.       . gabapentin (NEURONTIN) 300 MG capsule 3 tabs by mouth 5 times daily.       Marland Kitchen lisinopril (PRINIVIL,ZESTRIL) 40 MG tablet 1/2 tab by mouth two times a day.       . mesalamine (APRISO) 0.375 G 24 hr capsule Take 375 mg by mouth. Take 4 tabs by mouth each morning       . metoprolol (LOPRESSOR) 50 MG tablet 1/2 tab by mouth two times a day.       . naproxen sodium (ANAPROX) 220 MG tablet Take 220 mg by mouth 2 (two) times daily with a meal.        . pravastatin (PRAVACHOL) 40 MG tablet Take 40 mg by mouth daily.        Marland Kitchen spironolactone (ALDACTONE) 25 MG tablet Take 25 mg by mouth daily.        Marland Kitchen terazosin (HYTRIN) 10 MG capsule Take 10 mg by mouth at bedtime.        . vitamin B-12 (CYANOCOBALAMIN) 1000 MCG tablet Take 1,000 mcg by mouth daily.          No Known Allergies  Past Medical History  Diagnosis Date  . Arthralgia   . Colitis     . Edema   . Venous insufficiency   . Olecranon bursitis   . Peripheral neuropathy   . Murmur   . Carotid stenosis   . Aortic stenosis   . CAD (coronary artery disease)   . Hearing loss   . Anxiety   . Hyperuricemia   . Pulmonary hypertension   . Polyneuropathy   . Gout   . OSA (obstructive sleep apnea)   . Hypertension   . Hyperlipidemia   . Impacted cerumen     ROS: Negative except as per HPI  BP 125/58  Pulse 53  Ht 5\' 4"  (1.626 m)  Wt 184 lb (83.462 kg)  BMI 31.58 kg/m2  PHYSICAL EXAM: Pt is alert and oriented, NAD HEENT: normal Neck: JVP - normal, carotids 2+= without bruits Lungs: CTA bilaterally CV: RRR with grade 3/6 systolic murmur at the left lower sternal border Abd: soft, NT, Positive BS, no hepatomegaly Ext: no C/C/E, distal pulses intact and equal Skin:  warm/dry no rash  ASSESSMENT AND PLAN:

## 2011-03-24 NOTE — Patient Instructions (Signed)
Your physician wants you to follow-up in: 6 MONTHS.  You will receive a reminder letter in the mail two months in advance. If you don't receive a letter, please call our office to schedule the follow-up appointment.  Your physician recommends that you continue on your current medications as directed. Please refer to the Current Medication list given to you today.  

## 2011-03-24 NOTE — Assessment & Plan Note (Signed)
Recent echo results were reviewed. His left ventricular septal thickness is less than 20 mm. He does have asymmetric hypertrophy with systolic anterior motion of the mitral valve. The patient is well beta blocked. He has no exertional lightheadedness, syncope, or chest pain. Recommend continue current medical program.

## 2011-03-24 NOTE — Assessment & Plan Note (Signed)
Stable without angina. The patient is on antiplatelet therapy with aspirin. Lipids are treated with pravastatin

## 2011-04-02 ENCOUNTER — Telehealth: Payer: Self-pay | Admitting: Pulmonary Disease

## 2011-04-02 NOTE — Telephone Encounter (Signed)
PATIENT SAID DR [facility] CALLED HIM.  PLEASE CALL BACK

## 2011-04-02 NOTE — Telephone Encounter (Signed)
Please let pt know that he has severe sleep apnea, and this can greatly impact a lot of his medical issues (especially his heart).  If he feels he cannot tolerate cpap, would he consider other options such as surgery or dental appliance?  These will not cure his sleep apnea, but can make it better.  If he is willing to consider other options, have him make apptm.

## 2011-04-02 NOTE — Telephone Encounter (Signed)
Spoke with pt and notified of recs per [facility]. He verbalized understanding. Wants to discuss other options for tx- appt with [facility] sched for 6/29 at 1:30 pm

## 2011-04-02 NOTE — Telephone Encounter (Signed)
[email or web address]  

## 2011-04-02 NOTE — Telephone Encounter (Signed)
Spoke with pt. He states that he can not use CPAP- has tried nasal mask and this was very uncomfortable and he has already turned back in. He states that he sleep better without using CPAP. I tried to get him to elaborate on specific things that bothered him, but all he kept saying is that, " I just can't use it". Will forward to [facility] for recs. Please advise, thanks!

## 2011-04-04 ENCOUNTER — Ambulatory Visit (INDEPENDENT_AMBULATORY_CARE_PROVIDER_SITE_OTHER): Payer: Medicare Other | Admitting: Pulmonary Disease

## 2011-04-04 ENCOUNTER — Encounter: Payer: Self-pay | Admitting: Pulmonary Disease

## 2011-04-04 VITALS — BP 132/64 | HR 55 | Temp 98.2°F | Ht 64.0 in | Wt 183.4 lb

## 2011-04-04 DIAGNOSIS — G4733 Obstructive sleep apnea (adult) (pediatric): Secondary | ICD-10-CM

## 2011-04-04 NOTE — Patient Instructions (Signed)
Can consider dental appliance and oxygen at bedtime.  Review the handouts, and let me know if you would consider seeing Dr. Althea Grimmer for dental appliance evaluation. Work on weight loss.

## 2011-04-04 NOTE — Progress Notes (Signed)
  Subjective:    Patient ID: Paul Mays, male    DOB: 25-Dec-1927, 77 y.o.   MRN: [id number]  HPI The pt comes in today for f/u of his known severe osa.  He has been completely intolerant to cpap, and feels it is not a viable treatment option for him.  He has returned the machine to the DME.  He would like to discuss today other options for treatment of his osa.   Review of Systems  Constitutional: Negative for fever and unexpected weight change.  HENT: Positive for rhinorrhea. Negative for ear pain, nosebleeds, congestion, sore throat, sneezing, trouble swallowing, dental problem, postnasal drip and sinus pressure.   Eyes: Negative for redness and itching.  Respiratory: Negative for cough, chest tightness, shortness of breath and wheezing.   Cardiovascular: Negative for palpitations and leg swelling.  Gastrointestinal: Negative for nausea and vomiting.  Genitourinary: Negative for dysuria.  Musculoskeletal: Negative for joint swelling.  Skin: Negative for rash.  Neurological: Negative for headaches.  Hematological: Bruises/bleeds easily.  Psychiatric/Behavioral: Negative for dysphoric mood. The patient is not nervous/anxious.        Objective:   Physical Exam Ow male in nad No skin breakdown or pressure necrosis from cpap mask LE with trace edema, no cyanosis noted. Alert, does not appear sleepy, moves all 4.        Assessment & Plan:

## 2011-04-04 NOTE — Assessment & Plan Note (Signed)
The pt is completely intolerant to cpap, and feels he does not want to work on desensitization or a change in pressure delivery method.  I have had a long discussion with him about other treatment options, including surgery and dental appliance.  He understands neither will "cure" his sleep apnea.  I think a dental appliance coupled with more aggressive weight loss on his part would be the best alternative to cpap.  I have given him literature on this, and he will call if he wishes to pursue.  The other option is to put him on nocturnal oxygen, but this will not help his QOL or daytime alertness.

## 2011-04-14 ENCOUNTER — Other Ambulatory Visit: Payer: Self-pay | Admitting: Pulmonary Disease

## 2011-04-14 ENCOUNTER — Telehealth: Payer: Self-pay | Admitting: Pulmonary Disease

## 2011-04-14 DIAGNOSIS — G4733 Obstructive sleep apnea (adult) (pediatric): Secondary | ICD-10-CM

## 2011-04-14 NOTE — Telephone Encounter (Signed)
Nothing more to note, will sign off of note.

## 2011-04-14 NOTE — Telephone Encounter (Signed)
Kc i need an order for this appt with dr Myrtis Ser thanks

## 2011-04-14 NOTE — Telephone Encounter (Signed)
done

## 2011-07-25 ENCOUNTER — Ambulatory Visit: Payer: Medicare Other | Admitting: Pulmonary Disease

## 2011-08-06 ENCOUNTER — Ambulatory Visit: Payer: Medicare Other | Admitting: Pulmonary Disease

## 2011-09-15 ENCOUNTER — Encounter: Payer: Self-pay | Admitting: Cardiovascular Disease

## 2011-09-15 ENCOUNTER — Ambulatory Visit (INDEPENDENT_AMBULATORY_CARE_PROVIDER_SITE_OTHER): Payer: Medicare Other | Admitting: Cardiovascular Disease

## 2011-09-15 DIAGNOSIS — I10 Essential (primary) hypertension: Secondary | ICD-10-CM

## 2011-09-15 DIAGNOSIS — I2510 Atherosclerotic heart disease of native coronary artery without angina pectoris: Secondary | ICD-10-CM

## 2011-09-15 DIAGNOSIS — I359 Nonrheumatic aortic valve disorder, unspecified: Secondary | ICD-10-CM

## 2011-09-15 DIAGNOSIS — I422 Other hypertrophic cardiomyopathy: Secondary | ICD-10-CM

## 2011-09-15 MED ORDER — AZITHROMYCIN 250 MG PO TABS: ORAL_TABLET | ORAL | Status: AC

## 2011-09-15 NOTE — Assessment & Plan Note (Signed)
The patient is stable without angina. He will continue on his current medical program.

## 2011-09-15 NOTE — Progress Notes (Signed)
HPI:  77 year old gentleman presenting for followup evaluation. The patient has coronary artery disease and underwent remote percutaneous intervention. He has no anginal symptoms. The patient admits to chronic dyspnea but this is unchanged over many years. He has been followed for asymmetric left ventricular septal hypertrophy with outflow tract obstruction. He has been doing well until the last 2 weeks when he is developed symptoms of an upper respiratory infection. He reports a productive cough, weakness, and lightheadedness. He has had subjective fevers but has not measured his temperature. He denies rigors. The He denies shortness of breath or chest tightness. His appetite is decreased but he's been eating and his fluid intake has been okay by his report.  Outpatient Encounter Prescriptions as of 09/15/2011  Medication Sig Dispense Refill  . allopurinol (ZYLOPRIM) 300 MG tablet Take 300 mg by mouth daily.        Marland Kitchen aspirin 81 MG tablet Take 81 mg by mouth daily.        . Cholecalciferol (VITAMIN D3) 1000 UNITS CAPS Take 1,000 Units by mouth.        . COLCRYS 0.6 MG tablet As directed      . gabapentin (NEURONTIN) 300 MG capsule 3 tabs by mouth 5 times daily.       Marland Kitchen lisinopril (PRINIVIL,ZESTRIL) 40 MG tablet 1/2 tab by mouth two times a day.       . mesalamine (APRISO) 0.375 G 24 hr capsule Take 375 mg by mouth. Take 4 tabs by mouth each morning       . metoprolol (LOPRESSOR) 50 MG tablet 1/2 tab by mouth two times a day.       . naproxen sodium (ANAPROX) 220 MG tablet Take 220 mg by mouth 2 (two) times daily as needed.       . pravastatin (PRAVACHOL) 40 MG tablet Take 40 mg by mouth daily.        Marland Kitchen spironolactone (ALDACTONE) 25 MG tablet Take 25 mg by mouth daily.        Marland Kitchen terazosin (HYTRIN) 10 MG capsule Take 10 mg by mouth at bedtime.          No Known Allergies  Past Medical History  Diagnosis Date  . Arthralgia   . Colitis   . Edema   . Venous insufficiency   . Olecranon bursitis     . Peripheral neuropathy   . Murmur   . Carotid stenosis   . Aortic stenosis   . CAD (coronary artery disease)   . Hearing loss   . Anxiety   . Hyperuricemia   . Pulmonary hypertension   . Polyneuropathy   . Gout   . OSA (obstructive sleep apnea)   . Hypertension   . Hyperlipidemia   . Impacted cerumen     ROS: Negative except as per HPI  BP 118/58  Pulse 97  Ht 5\' 7"  (1.702 m)  Wt 78.926 kg (174 lb)  BMI 27.25 kg/m2  PHYSICAL EXAM: Pt is alert and oriented, NAD.  He appears nontoxic. HEENT: normal Neck: JVP - normal, carotids 2+= without bruits Lungs: CTA bilaterally CV: RRR with grade 3/6 systolic murmur at the left sternal border Abd: soft, NT, Positive BS, no hepatomegaly Ext: no C/C/E, distal pulses intact and equal Skin: warm/dry no rash  EKG:  Normal sinus rhythm 97 beats per minute, possible left atrial enlargement, otherwise within normal limits.  ASSESSMENT AND PLAN:

## 2011-09-15 NOTE — Patient Instructions (Signed)
Your physician has recommended you make the following change in your medication: HOLD Lisinopril and Spironolactone for 1 WEEK and then resume,  Z-pak as directed,  PUSH FLUIDS  Please call Dr Alwyn Ren if your symptoms have not improved in the next 48 hours.   Your physician wants you to follow-up in: 6 MONTHS. You will receive a reminder letter in the mail two months in advance. If you don't receive a letter, please call our office to schedule the follow-up appointment.

## 2011-09-15 NOTE — Assessment & Plan Note (Addendum)
The patient has asymmetric septal hypertrophy with moderate increase in septal thickness (18 mm). He currently has a flulike illness with upper respiratory symptoms and generalized weakness. I have recommended he hold his ACE inhibitor and Aldactone for a period of one week until he has better oral intake and his symptoms have improved. He was written a prescription for azithromycin. I advised him to call his primary care physician, Dr. Alwyn Ren if his symptoms are not improved in the next 24-48 hours.  He was advised to push fluids.

## 2011-09-15 NOTE — Assessment & Plan Note (Signed)
Blood pressure is in the low-normal range. Will hold lisinopril and Aldactone as detailed above. He will resume in about one week when he is feeling better from his illness.

## 2011-09-20 ENCOUNTER — Encounter: Payer: Self-pay | Admitting: Internal Medicine

## 2011-10-09 ENCOUNTER — Encounter: Payer: Self-pay | Admitting: Internal Medicine

## 2011-10-20 DIAGNOSIS — H52209 Unspecified astigmatism, unspecified eye: Secondary | ICD-10-CM | POA: Diagnosis not present

## 2011-10-20 DIAGNOSIS — H2510 Age-related nuclear cataract, unspecified eye: Secondary | ICD-10-CM | POA: Diagnosis not present

## 2011-10-28 ENCOUNTER — Ambulatory Visit: Payer: Medicare Other | Admitting: Internal Medicine

## 2011-11-03 ENCOUNTER — Encounter: Payer: Self-pay | Admitting: Internal Medicine

## 2011-11-03 ENCOUNTER — Ambulatory Visit (INDEPENDENT_AMBULATORY_CARE_PROVIDER_SITE_OTHER): Payer: Medicare Other | Admitting: Internal Medicine

## 2011-11-03 VITALS — BP 140/62 | HR 80 | Ht 66.0 in | Wt 177.0 lb

## 2011-11-03 DIAGNOSIS — K5120 Ulcerative (chronic) proctitis without complications: Secondary | ICD-10-CM

## 2011-11-03 DIAGNOSIS — Z86010 Personal history of colonic polyps: Secondary | ICD-10-CM | POA: Diagnosis not present

## 2011-11-03 MED ORDER — PEG-KCL-NACL-NASULF-NA ASC-C 100 G PO SOLR: 1.0000 | Freq: Once | ORAL | Status: DC

## 2011-11-03 NOTE — Patient Instructions (Signed)
You have been scheduled for a colonoscopy with propofol. Please follow written instructions given to you at your visit today.  Please pick up your prep kit at the pharmacy within the next 2-3 days.  

## 2011-11-03 NOTE — Progress Notes (Signed)
HISTORY OF PRESENT ILLNESS:  Paul Mays is a 78 y.o. male multiple medical problems as outlined below. He has been seen in this office regarding a history of ulcerative proctitis and adenomatous colon polyps. Last colonoscopy October of 2007 revealed diverticulosis and a diminutive adenoma which was removed. No active colitis. He is maintained on mesalamine 0.375 g, 4 each morning. He obtain this from the [facility]. I have not seen him in some time. He did receive a recall letter regarding surveillance colonoscopy. Overall he is doing well. He is exercising and has lost weight. Chronic medical problems are stable. No lower GI complaints. GI review of systems is negative.  REVIEW OF SYSTEMS:  All non-GI ROS negative except for arthritis, heart murmur, painful neuropathy on the lower extremities  Past Medical History  Diagnosis Date  . Arthralgia   . Colitis   . Edema   . Venous insufficiency   . Olecranon bursitis   . Peripheral neuropathy   . Murmur   . Carotid stenosis   . Aortic stenosis   . CAD (coronary artery disease)   . Hearing loss   . Anxiety   . Hyperuricemia   . Pulmonary hypertension   . Polyneuropathy   . Gout   . OSA (obstructive sleep apnea)   . Hypertension   . Hyperlipidemia   . Impacted cerumen     Past Surgical History  Procedure Date  . Angioplasty     w/ 2 stents in 1999  . Tonsillectomy   . Appendectomy   . Hemorrhoid surgery   . Lipoma excision     Social History RUSTY VILLELLA  reports that he has never smoked. He has never used smokeless tobacco. He reports that he drinks alcohol. He reports that he does not use illicit drugs.  family history is not on file.  No Known Allergies     PHYSICAL EXAMINATION: Vital signs: BP 140/62  Pulse 80  Ht 5\' 6"  (1.676 m)  Wt 177 lb (80.287 kg)  BMI 28.57 kg/m2 General: Well-developed, well-nourished, no acute distress HEENT: Sclerae are anicteric, conjunctiva pink. Oral mucosa intact Lungs:  Clear Heart: Regular Abdomen: soft, nontender, nondistended, no obvious ascites, no peritoneal signs, normal bowel sounds. No organomegaly. Extremities: No edema Psychiatric: alert and oriented x3. Cooperative     ASSESSMENT:  #1. History of ulcerative proctitis. Clinically quiescent. On mesalamine. #2. History of adenomatous colon polyp. Due for surveillance #3. General medical problems stable.    PLAN:  #1. Surveillance colonoscopy.The nature of the procedure, as well as the risks, benefits, and alternatives were carefully and thoroughly reviewed with the patient. Ample time for discussion and questions allowed. The patient understood, was satisfied, and agreed to proceed. Movi prep prescribed. Patient instructed on its use.  #2. Continue mesalamine

## 2011-11-04 DIAGNOSIS — L821 Other seborrheic keratosis: Secondary | ICD-10-CM | POA: Diagnosis not present

## 2011-11-04 DIAGNOSIS — L570 Actinic keratosis: Secondary | ICD-10-CM | POA: Diagnosis not present

## 2011-11-04 DIAGNOSIS — Z85828 Personal history of other malignant neoplasm of skin: Secondary | ICD-10-CM | POA: Diagnosis not present

## 2011-11-18 ENCOUNTER — Encounter: Payer: Medicare Other | Admitting: Internal Medicine

## 2011-12-09 DIAGNOSIS — M159 Polyosteoarthritis, unspecified: Secondary | ICD-10-CM | POA: Diagnosis not present

## 2011-12-09 DIAGNOSIS — N189 Chronic kidney disease, unspecified: Secondary | ICD-10-CM | POA: Diagnosis not present

## 2011-12-09 DIAGNOSIS — M1A00X0 Idiopathic chronic gout, unspecified site, without tophus (tophi): Secondary | ICD-10-CM | POA: Diagnosis not present

## 2011-12-09 DIAGNOSIS — M25519 Pain in unspecified shoulder: Secondary | ICD-10-CM | POA: Diagnosis not present

## 2011-12-11 DIAGNOSIS — I872 Venous insufficiency (chronic) (peripheral): Secondary | ICD-10-CM | POA: Diagnosis not present

## 2011-12-15 ENCOUNTER — Telehealth: Payer: Self-pay | Admitting: Cardiovascular Disease

## 2011-12-15 NOTE — Telephone Encounter (Signed)
Mailed release Form to pt on 3/11. emg

## 2011-12-19 ENCOUNTER — Telehealth: Payer: Self-pay | Admitting: Internal Medicine

## 2011-12-19 NOTE — Telephone Encounter (Signed)
Mailed release form 12-19-11.

## 2011-12-22 ENCOUNTER — Encounter: Payer: Self-pay | Admitting: *Deleted

## 2011-12-22 ENCOUNTER — Telehealth: Payer: Self-pay | Admitting: Internal Medicine

## 2011-12-22 NOTE — Telephone Encounter (Signed)
Explained to patient that Dr.[name] wants him to come by office to get sample of Movi Prep and to take as per his instructions. Patient understands and is coming to pick this up. Also spoke with Stewart,pharmacist and explained not to fill this.

## 2011-12-22 NOTE — Telephone Encounter (Signed)
Patient for colonoscopy tomorrow. Patient's pharmacy filled moviprep as peg 3350, patient has already mixed and placed in refrigerator. Since patient has already purchased, is it okay he takes this per split dose instructions?

## 2011-12-22 NOTE — Telephone Encounter (Signed)
Check with the office to see if we have a sample of the Movi prep. If so, he can come over and pick it up and follow the Movi prep instructions. If there is a problem, let me now.

## 2011-12-23 ENCOUNTER — Encounter: Payer: Self-pay | Admitting: Internal Medicine

## 2011-12-23 ENCOUNTER — Ambulatory Visit (AMBULATORY_SURGERY_CENTER): Payer: Medicare Other | Admitting: Internal Medicine

## 2011-12-23 VITALS — BP 162/71 | HR 52 | Temp 96.9°F | Resp 17 | Ht 66.0 in | Wt 177.0 lb

## 2011-12-23 DIAGNOSIS — Z86010 Personal history of colonic polyps: Secondary | ICD-10-CM | POA: Diagnosis not present

## 2011-12-23 DIAGNOSIS — K5120 Ulcerative (chronic) proctitis without complications: Secondary | ICD-10-CM

## 2011-12-23 DIAGNOSIS — F411 Generalized anxiety disorder: Secondary | ICD-10-CM | POA: Diagnosis not present

## 2011-12-23 DIAGNOSIS — Z1211 Encounter for screening for malignant neoplasm of colon: Secondary | ICD-10-CM

## 2011-12-23 MED ORDER — SODIUM CHLORIDE 0.9 % IV SOLN: 500.0000 mL | INTRAVENOUS | Status: DC

## 2011-12-23 NOTE — Op Note (Signed)
[facility] [address] [place], [place]  [address]  COLONOSCOPY PROCEDURE REPORT  PATIENT:  Paul, Mays  MR#:  [id number] BIRTHDATE:  [birth year], 84 yrs. old  GENDER:  male ENDOSCOPIST:  Wilhemina Bonito. Eda Keys, MD REF. BY:  Surveillance Program Recall, PROCEDURE DATE:  12/23/2011 PROCEDURE:  Surveillance Colonoscopy ASA CLASS:  Class II INDICATIONS:  history of pre-cancerous (adenomatous) colon polyps, surveillance and high-risk screening ; 07-2006 w/ Ta ; also hx ulcerative proctitis MEDICATIONS:   MAC sedation, administered by CRNA, propofol (Diprivan) 200 mg IV  DESCRIPTION OF PROCEDURE:   After the risks benefits and alternatives of the procedure were thoroughly explained, informed consent was obtained.  Digital rectal exam was performed and revealed no abnormalities.   The [id number] endoscope was introduced through the anus and advanced to the cecum, which was identified by both the appendix and ileocecal valve, without limitations.  The quality of the prep was excellent, using MoviPrep.  The instrument was then slowly withdrawn as the colon was fully examined. <<PROCEDUREIMAGES>>  FINDINGS:  Moderate diverticulosis was found found scattered throught the colon.  Otherwise normal colonoscopy without  polyps, masses, vascular ectasias, or inflammatory changes.   Retroflexed views in the rectum revealed no abnormalities.    The time to cecum =  2:31  minutes. The scope was then withdrawn in  7:28 minutes from the cecum and the procedure completed.  COMPLICATIONS:  None  ENDOSCOPIC IMPRESSION: 1) Moderate diverticulosis found scattered throught the colon 2) Otherwise normal colonoscopy  RECOMMENDATIONS: 1) Return to the care of your primary provider. GI follow up as needed  ______________________________ Wilhemina Bonito. Eda Keys, MD  CC:  Pecola Lawless, MD;  The Patient  n. eSIGNED:   Wilhemina Bonito. Eda Keys at 12/23/2011 10:39 AM  Mahalia Longest, [phone]

## 2011-12-23 NOTE — Progress Notes (Signed)
Patient did not experience any of the following events: a burn prior to discharge; a fall within the facility; wrong site/side/patient/procedure/implant event; or a hospital transfer or hospital admission upon discharge from the facility. (G8907) Patient did not have preoperative order for IV antibiotic SSI prophylaxis. (G8918)  

## 2011-12-23 NOTE — Patient Instructions (Signed)
YOU HAD AN ENDOSCOPIC PROCEDURE TODAY AT THE [facility]: Refer to the procedure report that was given to you for any specific questions about what was found during the examination.  If the procedure report does not answer your questions, please call your gastroenterologist to clarify.  If you requested that your care partner not be given the details of your procedure findings, then the procedure report has been included in a sealed envelope for you to review at your convenience later.  YOU SHOULD EXPECT: Some feelings of bloating in the abdomen. Passage of more gas than usual.  Walking can help get rid of the air that was put into your GI tract during the procedure and reduce the bloating. If you had a lower endoscopy (such as a colonoscopy or flexible sigmoidoscopy) you may notice spotting of blood in your stool or on the toilet paper. If you underwent a bowel prep for your procedure, then you may not have a normal bowel movement for a few days.  DIET: Your first meal following the procedure should be a light meal and then it is ok to progress to your normal diet.  A half-sandwich or bowl of soup is an example of a good first meal.  Heavy or fried foods are harder to digest and may make you feel nauseous or bloated.  Likewise meals heavy in dairy and vegetables can cause extra gas to form and this can also increase the bloating.  Drink plenty of fluids but you should avoid alcoholic beverages for 24 hours.  ACTIVITY: Your care partner should take you home directly after the procedure.  You should plan to take it easy, moving slowly for the rest of the day.  You can resume normal activity the day after the procedure however you should NOT DRIVE or use heavy machinery for 24 hours (because of the sedation medicines used during the test).    SYMPTOMS TO REPORT IMMEDIATELY: A gastroenterologist can be reached at any hour.  During normal business hours, 8:30 AM to 5:00 PM Monday through Friday,  call [phone].  After hours and on weekends, please call the GI answering service at [phone] who will take a message and have the physician on call contact you.   Following lower endoscopy (colonoscopy or flexible sigmoidoscopy):  Excessive amounts of blood in the stool  Significant tenderness or worsening of abdominal pains  Swelling of the abdomen that is new, acute  Fever of 100F or higher    FOLLOW UP: If any biopsies were taken you will be contacted by phone or by letter within the next 1-3 weeks.  Call your gastroenterologist if you have not heard about the biopsies in 3 weeks.  Our staff will call the home number listed on your records the next business day following your procedure to check on you and address any questions or concerns that you may have at that time regarding the information given to you following your procedure. This is a courtesy call and so if there is no answer at the home number and we have not heard from you through the emergency physician on call, we will assume that you have returned to your regular daily activities without incident.  SIGNATURES/CONFIDENTIALITY: You and/or your care partner have signed paperwork which will be entered into your electronic medical record.  These signatures attest to the fact that that the information above on your After Visit Summary has been reviewed and is understood.  Full responsibility of the confidentiality   of this discharge information lies with you and/or your care-partner.     

## 2011-12-24 ENCOUNTER — Telehealth: Payer: Self-pay | Admitting: *Deleted

## 2011-12-24 NOTE — Telephone Encounter (Signed)
  Follow up Call-  Call back number 12/23/2011  Post procedure Call Back phone  # [phone]  Permission to leave phone message Yes     Patient questions:  Do you have a fever, pain , or abdominal swelling? no Pain Score  0 *  Have you tolerated food without any problems? yes  Have you been able to return to your normal activities? yes  Do you have any questions about your discharge instructions: Diet   no Medications  no Follow up visit  no  Do you have questions or concerns about your Care? no  Actions: * If pain score is 4 or above: No action needed, pain <4.

## 2012-02-05 DIAGNOSIS — M25529 Pain in unspecified elbow: Secondary | ICD-10-CM | POA: Diagnosis not present

## 2012-02-05 DIAGNOSIS — M7020 Olecranon bursitis, unspecified elbow: Secondary | ICD-10-CM | POA: Diagnosis not present

## 2012-02-27 DIAGNOSIS — N433 Hydrocele, unspecified: Secondary | ICD-10-CM | POA: Diagnosis not present

## 2012-02-27 DIAGNOSIS — Z8546 Personal history of malignant neoplasm of prostate: Secondary | ICD-10-CM | POA: Diagnosis not present

## 2012-03-15 ENCOUNTER — Telehealth: Payer: Self-pay | Admitting: Internal Medicine

## 2012-03-15 NOTE — Telephone Encounter (Signed)
requesting labs for potassium, per patient went to [place] today and they told him it was low. Can call patient at [phone]

## 2012-03-15 NOTE — Telephone Encounter (Signed)
To increase  Potassium (K+) increase citrus fruits & bananas in diet and use the salt substitute No Salt, which contains  potassium , to season food @ the table. Recheck K+ (276.8)

## 2012-03-16 NOTE — Telephone Encounter (Signed)
Discuss with patient, appt scheduled. 

## 2012-03-16 NOTE — Telephone Encounter (Signed)
Potassium should be repeated with BUN and creatinine early one morning. Potassium can be elevated if the blood sample sits for a period time.  He should be seen with all actual p As some medications can raise potassium.ill bottles.

## 2012-03-16 NOTE — Telephone Encounter (Signed)
Pt states his potassium level is HIGH not low. His level at the [place] was 6.2. Please advise.

## 2012-03-22 ENCOUNTER — Ambulatory Visit (INDEPENDENT_AMBULATORY_CARE_PROVIDER_SITE_OTHER): Payer: Medicare Other | Admitting: Internal Medicine

## 2012-03-22 VITALS — BP 110/56 | HR 62 | Temp 98.1°F | Wt 175.0 lb

## 2012-03-22 DIAGNOSIS — I10 Essential (primary) hypertension: Secondary | ICD-10-CM | POA: Diagnosis not present

## 2012-03-22 DIAGNOSIS — E875 Hyperkalemia: Secondary | ICD-10-CM | POA: Diagnosis not present

## 2012-03-22 NOTE — Patient Instructions (Addendum)
Blood Pressure Goal  Ideally is an AVERAGE < 135/85. This AVERAGE should be calculated from @ least 5-7 BP readings taken @ different times of day on different days of week. You should not respond to isolated BP readings , but rather the AVERAGE for that week  Please try to go on My Chart within the next 24 hours to allow me to release the results directly to you.  

## 2012-03-22 NOTE — Progress Notes (Signed)
  Subjective:    Patient ID: Paul Mays, male    DOB: 1928/03/21, 78 y.o.   MRN: [id number]  HPI Weds 03/17/12 his potassium was found to be 6.2 at the [place]. Dr. Antionette Poles stopped his spironolactone & lisinopril. His blood pressure at that time was in the range of 160+ / 50.   Review of Systems CHRONIC HYPERTENSION: Disease Monitoring  Blood pressure range:not monitored  Chest pain: no  Dyspnea: no   Claudication: no   Medication compliance: see above ; still on Metoprolol Medication Side Effects  Lightheadedness: only with gabapentin 300 mg for neuropathy   Urinary frequency: no   Edema:no but asymmetry in size of ankles   Preventitive Healthcare:  Exercise: walks dog  Diet Pattern: portion control  Salt Restriction: yes       Objective:   Physical Exam He appears healthy and well-nourished; he is in no acute distress  No carotid bruits are present; there is radiation of the murmur into the carotids.  Heart rhythm and rate are normal with grade 2/6 holosystolic murmur at the base. Faint radiation into the epigastrium  Chest is clear with no increased work of breathing  There is no evidence of aortic aneurysm or renal artery bruits  He has no clubbing or edema.   Pedal pulses are intact   No ischemic skin changes are present         Assessment & Plan:  #1 hyperkalemia in the context of combined spironolactone and lisinopril therapy.  #2 hypertension well-controlled off these 2 agents  Plan: Monitor blood pressure while holding these 2. Recheck potassium and kidney function

## 2012-03-23 LAB — POTASSIUM: Potassium: 4.9 mEq/L (ref 3.5–5.1)

## 2012-03-25 ENCOUNTER — Telehealth: Payer: Self-pay | Admitting: *Deleted

## 2012-03-25 NOTE — Telephone Encounter (Signed)
See results

## 2012-03-25 NOTE — Telephone Encounter (Signed)
Dr.Hopper please advise on labs  

## 2012-03-25 NOTE — Telephone Encounter (Signed)
Pt left msg on triage vmail requesting lab results.  

## 2012-03-26 NOTE — Telephone Encounter (Signed)
Patient aware of results, copy to be mailed

## 2012-04-06 DIAGNOSIS — IMO0002 Reserved for concepts with insufficient information to code with codable children: Secondary | ICD-10-CM | POA: Diagnosis not present

## 2012-04-06 DIAGNOSIS — M1710 Unilateral primary osteoarthritis, unspecified knee: Secondary | ICD-10-CM | POA: Diagnosis not present

## 2012-04-12 DIAGNOSIS — M159 Polyosteoarthritis, unspecified: Secondary | ICD-10-CM | POA: Diagnosis not present

## 2012-04-12 DIAGNOSIS — N189 Chronic kidney disease, unspecified: Secondary | ICD-10-CM | POA: Diagnosis not present

## 2012-04-12 DIAGNOSIS — M25519 Pain in unspecified shoulder: Secondary | ICD-10-CM | POA: Diagnosis not present

## 2012-04-12 DIAGNOSIS — M1A00X0 Idiopathic chronic gout, unspecified site, without tophus (tophi): Secondary | ICD-10-CM | POA: Diagnosis not present

## 2012-05-04 DIAGNOSIS — M159 Polyosteoarthritis, unspecified: Secondary | ICD-10-CM | POA: Diagnosis not present

## 2012-05-04 DIAGNOSIS — N189 Chronic kidney disease, unspecified: Secondary | ICD-10-CM | POA: Diagnosis not present

## 2012-05-04 DIAGNOSIS — M25579 Pain in unspecified ankle and joints of unspecified foot: Secondary | ICD-10-CM | POA: Diagnosis not present

## 2012-05-04 DIAGNOSIS — M1A00X0 Idiopathic chronic gout, unspecified site, without tophus (tophi): Secondary | ICD-10-CM | POA: Diagnosis not present

## 2012-06-03 DIAGNOSIS — H00019 Hordeolum externum unspecified eye, unspecified eyelid: Secondary | ICD-10-CM | POA: Diagnosis not present

## 2012-06-08 ENCOUNTER — Telehealth: Payer: Self-pay | Admitting: Internal Medicine

## 2012-06-08 NOTE — Telephone Encounter (Signed)
Spoke with pts wife and she is aware. 

## 2012-06-08 NOTE — Telephone Encounter (Signed)
Yes, to reduce risk of colitis flare

## 2012-06-08 NOTE — Telephone Encounter (Signed)
Pt states his last colon was good. He wants to know if he needs to continue taking the mesalamine. Please advise.

## 2012-07-01 DIAGNOSIS — L821 Other seborrheic keratosis: Secondary | ICD-10-CM | POA: Diagnosis not present

## 2012-07-01 DIAGNOSIS — L570 Actinic keratosis: Secondary | ICD-10-CM | POA: Diagnosis not present

## 2012-07-01 DIAGNOSIS — Z85828 Personal history of other malignant neoplasm of skin: Secondary | ICD-10-CM | POA: Diagnosis not present

## 2012-07-22 DIAGNOSIS — Z23 Encounter for immunization: Secondary | ICD-10-CM | POA: Diagnosis not present

## 2012-08-12 DIAGNOSIS — M1A00X0 Idiopathic chronic gout, unspecified site, without tophus (tophi): Secondary | ICD-10-CM | POA: Diagnosis not present

## 2012-08-12 DIAGNOSIS — M159 Polyosteoarthritis, unspecified: Secondary | ICD-10-CM | POA: Diagnosis not present

## 2012-08-12 DIAGNOSIS — N189 Chronic kidney disease, unspecified: Secondary | ICD-10-CM | POA: Diagnosis not present

## 2012-08-12 DIAGNOSIS — M25519 Pain in unspecified shoulder: Secondary | ICD-10-CM | POA: Diagnosis not present

## 2012-08-19 DIAGNOSIS — H524 Presbyopia: Secondary | ICD-10-CM | POA: Diagnosis not present

## 2012-08-19 DIAGNOSIS — H2510 Age-related nuclear cataract, unspecified eye: Secondary | ICD-10-CM | POA: Diagnosis not present

## 2012-08-19 DIAGNOSIS — H52209 Unspecified astigmatism, unspecified eye: Secondary | ICD-10-CM | POA: Diagnosis not present

## 2012-08-19 DIAGNOSIS — H25049 Posterior subcapsular polar age-related cataract, unspecified eye: Secondary | ICD-10-CM | POA: Diagnosis not present

## 2012-08-30 ENCOUNTER — Other Ambulatory Visit: Payer: Self-pay | Admitting: Internal Medicine

## 2012-08-30 ENCOUNTER — Telehealth: Payer: Self-pay | Admitting: Internal Medicine

## 2012-08-30 DIAGNOSIS — H6120 Impacted cerumen, unspecified ear: Secondary | ICD-10-CM

## 2012-08-30 NOTE — Telephone Encounter (Signed)
Hopp please advise, would this be a referral to ENT

## 2012-08-30 NOTE — Telephone Encounter (Signed)
Patient states he would like referral to get his ears cleaned out. He states last time Dr. Alwyn Ren was unable to do 1 of them and he needs both of them done. CB# [phone]

## 2012-09-06 DIAGNOSIS — H6120 Impacted cerumen, unspecified ear: Secondary | ICD-10-CM | POA: Diagnosis not present

## 2012-09-07 DIAGNOSIS — H269 Unspecified cataract: Secondary | ICD-10-CM | POA: Diagnosis not present

## 2012-09-07 DIAGNOSIS — H2589 Other age-related cataract: Secondary | ICD-10-CM | POA: Diagnosis not present

## 2012-09-07 DIAGNOSIS — H25049 Posterior subcapsular polar age-related cataract, unspecified eye: Secondary | ICD-10-CM | POA: Diagnosis not present

## 2012-09-07 DIAGNOSIS — H2510 Age-related nuclear cataract, unspecified eye: Secondary | ICD-10-CM | POA: Diagnosis not present

## 2012-10-26 DIAGNOSIS — H25049 Posterior subcapsular polar age-related cataract, unspecified eye: Secondary | ICD-10-CM | POA: Diagnosis not present

## 2012-10-26 DIAGNOSIS — H2510 Age-related nuclear cataract, unspecified eye: Secondary | ICD-10-CM | POA: Diagnosis not present

## 2012-10-26 DIAGNOSIS — H2589 Other age-related cataract: Secondary | ICD-10-CM | POA: Diagnosis not present

## 2013-01-18 DIAGNOSIS — M159 Polyosteoarthritis, unspecified: Secondary | ICD-10-CM | POA: Diagnosis not present

## 2013-01-18 DIAGNOSIS — M25519 Pain in unspecified shoulder: Secondary | ICD-10-CM | POA: Diagnosis not present

## 2013-01-18 DIAGNOSIS — M1A00X0 Idiopathic chronic gout, unspecified site, without tophus (tophi): Secondary | ICD-10-CM | POA: Diagnosis not present

## 2013-01-18 DIAGNOSIS — N189 Chronic kidney disease, unspecified: Secondary | ICD-10-CM | POA: Diagnosis not present

## 2013-01-28 ENCOUNTER — Ambulatory Visit (INDEPENDENT_AMBULATORY_CARE_PROVIDER_SITE_OTHER): Payer: Medicare Other | Admitting: Internal Medicine

## 2013-01-28 ENCOUNTER — Encounter: Payer: Self-pay | Admitting: Internal Medicine

## 2013-01-28 VITALS — BP 110/58 | HR 66 | Temp 98.5°F | Resp 16 | Wt 182.1 lb

## 2013-01-28 DIAGNOSIS — J209 Acute bronchitis, unspecified: Secondary | ICD-10-CM | POA: Diagnosis not present

## 2013-01-28 MED ORDER — FLUTICASONE-SALMETEROL 500-50 MCG/DOSE IN AEPB: 1.0000 | INHALATION_SPRAY | Freq: Two times a day (BID) | RESPIRATORY_TRACT | Status: DC

## 2013-01-28 MED ORDER — PREDNISONE 20 MG PO TABS: 20.0000 mg | ORAL_TABLET | Freq: Two times a day (BID) | ORAL | Status: DC

## 2013-01-28 MED ORDER — AMOXICILLIN 500 MG PO CAPS: 500.0000 mg | ORAL_CAPSULE | Freq: Three times a day (TID) | ORAL | Status: DC

## 2013-01-28 NOTE — Patient Instructions (Addendum)
Advair one  inhalation every 12 hours; gargle and spit after use 

## 2013-01-28 NOTE — Progress Notes (Signed)
  Subjective:    Patient ID: Paul Mays, male    DOB: [birth year], 79 y.o.   MRN: [id number]  HPI   Symptoms began approximately a week ago as chest congestion associated with cough with scant yellow sputum. He's also had associated wheezing. The chest symptoms have also been associated with malaise.  He has used Mucinex, NyQuil, aspirin, and Tessalon Perles with only partial benefit.  His wife has been ill with respiratory tract infection.  He has no history of asthma. He smoked remotely less than a pack per month.      Review of Systems  He has had no fever, chills, or sweats.  He denies significant extrinsic symptoms of itchy, watery eyes. He's had some sneezing.  He denies frontal headache, facial pain, nasal purulence (scant discharge), sore throat, dental pain, otic pain, or otic discharge.  There has been no shortness of breath with a cough.     Objective:   Physical Exam General appearance:Appears younger than stated age ;well nourished; no acute distress or increased work of breathing is present.  No  lymphadenopathy about the head, neck, or axilla noted.   Eyes: No conjunctival inflammation or lid edema is present.   Ears:  External ear exam shows no significant lesions or deformities.  Otoscopic examination reveals clear canals, tympanic membranes are intact bilaterally without bulging, retraction, inflammation or discharge.  Nose:  External nasal examination shows no deformity or inflammation. Nasal mucosa are pink and moist without lesions or exudates. No septal dislocation or deviation.No obstruction to airflow.   Oral exam: Dental hygiene is good; lips and gums are healthy appearing.There is no oropharyngeal erythema or exudate noted.   Neck:  No deformities,  masses, or tenderness noted.    Heart:  Normal rate and regular rhythm. S1 and S2 normal without gallop,  click, rub or other extra sounds. Grade 1.5 /6 systolic murmur  Lungs: He exhibits low-grade  rhonchi and wheezing in all lung fields.No increased work of breathing.    Extremities:  No cyanosis, edema, or clubbing  noted    Skin: Warm & dry         Assessment & Plan:  #1 asthmatic bronchitis without associated significant upper respiratory tract infection.  Plan: See orders and recommendations

## 2013-03-02 DIAGNOSIS — Z8546 Personal history of malignant neoplasm of prostate: Secondary | ICD-10-CM | POA: Diagnosis not present

## 2013-05-31 ENCOUNTER — Telehealth: Payer: Self-pay | Admitting: Neurology

## 2013-06-01 NOTE — Telephone Encounter (Signed)
Reassigned to Dr. [name]. [place]et to scheduler.

## 2013-06-07 ENCOUNTER — Encounter: Payer: Self-pay | Admitting: Neurology

## 2013-06-07 ENCOUNTER — Ambulatory Visit (INDEPENDENT_AMBULATORY_CARE_PROVIDER_SITE_OTHER): Payer: Medicare Other | Admitting: Neurology

## 2013-06-07 VITALS — BP 145/73 | HR 52 | Ht 65.0 in | Wt 190.0 lb

## 2013-06-07 DIAGNOSIS — E785 Hyperlipidemia, unspecified: Secondary | ICD-10-CM | POA: Diagnosis not present

## 2013-06-07 DIAGNOSIS — G609 Hereditary and idiopathic neuropathy, unspecified: Secondary | ICD-10-CM | POA: Diagnosis not present

## 2013-06-07 MED ORDER — OXCARBAZEPINE 150 MG PO TABS: 150.0000 mg | ORAL_TABLET | Freq: Two times a day (BID) | ORAL | Status: DC

## 2013-06-07 NOTE — Patient Instructions (Signed)
Take Trileptal 150mg  every night with Neurontin 300mg  3 tabs for one week,  Then Trileptal 150mg  twice a day.  Keep Neurontin 300mg  3 tabs five times a day.

## 2013-06-07 NOTE — Progress Notes (Signed)
History of Present Illness:     Mr. Nasca is a 79 year old left-handed white married male, patient of [name] follow up for peripheral neuropathy, last visit was in 2012.  He had symptoms of peripheral neuropathy since 2006, his daughter was a neurologist, he was treated with  Gabapentin 300mg  3 tabs five times a day for many years, previously tried Cymbalta without benefit, never tried any other medications.   MRI of cervical spine 01/10/2007 showing degenerative disc disease, shallow disc protrusions, facet disease, and spondylosis.   Laboratory evaluation showed normal RA screen, sedimentation rate, and uric acid. He had a history of gout, is taking allopurinol, colchicine as needed .  He fell backwards 03/12/2009 striking his head in the left occipital region associated with loss of consciousness for 3-4 hours and was admitted to the trauma service at [facility] with small subarachnoid hemorrhage in the interhemispheric fissure on CT scan. Repeat CT scan of the brain showed the same findings and CT cervical spine showed degenerative changes with mild anteriorlisthesis of C4 on C5 thought to be degenerative.   He had 2 months of recurrent vertigo occurring when he lay down at night and turned his head characterized as a spinning sensation lasting seconds. He did not have headaches, double vision, swallowing problems,or CSF rhinorrhea.   He continues to be at very active, walking without difficulty, but need multiple dose of gabapentin 300 mg 3 tablets sometimes more than 5 times a day, he woke up in the middle of the night, feel burning pain at his bilateral feet. It usually takes 1 hour to kick in   Review of Systems  Out of a complete 14 system review, the patient complains of only the following symptoms, and all other reviewed systems are negative.  Cardiovascular: Murmur  Snoring, cramps,  Comments:  Colonoscopy has had Flu Vaccine 2011 Pneumonia Vaccine 2010  Social History   He finished 2 years of college. He is retired. He last worked in 2002. He drinks 3 cups of coffee per day. He does not use tobacco. Drinks 2 drinks of alcohol at night. he goes to bed 11 PM and gets up at 7 AM. He is married and has 3 children, a daughter 66, and 2 sons 62 and 52 who are alive and well. Inhaled Tobacco Use: unknown if ever smoked  Family History  Father had a stroke, high blood pressure, and high cholesterol. His  brother died with high blood pressure. His father sister and brother had diabetes mellitus.  Past Medical History   Hypertension for 40 years. High cholesterol for 40 years. Past history of cluster migraine headaches which have resolved. Peripheral neuropathy for 4 years. Gout. ulceratve colitis. Duodenal ulcer. History of left T12-L1 shingles. Posterior cervical laminectomy by Dr. Ples Specter in 1982. Hemorrhoidectomy 1969. Stent placement to his heart in 2002.  Surgical History   Hemorrhoidectomy in 1969, ruptured disc surgery to his neck and 67 on the left in 1982, stent placement heart in 2002.     Physical Exam  General:  Well-developed white male.   Neurologic Exam  Mental Status:  alert and oriented x3. Follows one, 2, three-step commands.No aphasia, agnosia, or apraxia. Cranial Nerves:  Visual fields are full. Discs LAD. Extraocular movements are full. Corneals are present. Hearing is decreased. Her conduction is better bone conduction. Tongue is midline, uvula is midline, and gags are present. Sternocleidomastoid and trapezius testing are normal. Motor:  5/5  No significant distal weakness.. Sensory:  Decreased  pinprick and touch in the lower extremities to ankle level. Absent vibratory sensation at toes. Coordination:  Outstretched hand and arm tremor. Gait and Station:  Can toe walk and heel walk. Mild difficulty with tandem walking.  Assessment and plan 79 years old right-handed Caucasian male, with past medical history of peripheral neuropathy, most  consistent with axonal small fiber neuropathy, slow progression, suboptimal control with gabapentin 300 mg 3 x5,  Will add on Trileptal titrating to 150 mg twice a day, potential side effects including increased dizziness, drowsiness, hyponatremia explained to patient,  Return to clinic in 6 months with Eber Jones

## 2013-06-16 DIAGNOSIS — L821 Other seborrheic keratosis: Secondary | ICD-10-CM | POA: Diagnosis not present

## 2013-06-16 DIAGNOSIS — D2360 Other benign neoplasm of skin of unspecified upper limb, including shoulder: Secondary | ICD-10-CM | POA: Diagnosis not present

## 2013-06-16 DIAGNOSIS — L570 Actinic keratosis: Secondary | ICD-10-CM | POA: Diagnosis not present

## 2013-06-16 DIAGNOSIS — Z85828 Personal history of other malignant neoplasm of skin: Secondary | ICD-10-CM | POA: Diagnosis not present

## 2013-06-16 DIAGNOSIS — D239 Other benign neoplasm of skin, unspecified: Secondary | ICD-10-CM | POA: Diagnosis not present

## 2013-06-16 DIAGNOSIS — L738 Other specified follicular disorders: Secondary | ICD-10-CM | POA: Diagnosis not present

## 2013-06-22 DIAGNOSIS — Z23 Encounter for immunization: Secondary | ICD-10-CM | POA: Diagnosis not present

## 2013-07-13 ENCOUNTER — Telehealth: Payer: Self-pay | Admitting: Internal Medicine

## 2013-07-13 NOTE — Telephone Encounter (Signed)
Patient is requesting a referral to [facility]. States that his Neurologist Dr. Avie Echevaria has retired and would like to be referred to [facility] office. Please advise.

## 2013-07-13 NOTE — Telephone Encounter (Signed)
The [facility]  require an updated, current  assessment and written note from the Primary Care physician  to review before they  schedule an appointment to assess symptoms or problems. You will need to  make an appointment to create this document THEY REQUIRE. It will be necessary to document prior evaluations and treatments by Dr Sandria Manly and response to these interventions. Please bring that medical history & all medications & supplements to that appointment so I can complete the required document.

## 2013-07-14 NOTE — Telephone Encounter (Signed)
Spoke with patient and he made an appt for 07/19/2013.

## 2013-07-19 ENCOUNTER — Ambulatory Visit (INDEPENDENT_AMBULATORY_CARE_PROVIDER_SITE_OTHER): Payer: Medicare Other | Admitting: Internal Medicine

## 2013-07-19 ENCOUNTER — Encounter: Payer: Self-pay | Admitting: Internal Medicine

## 2013-07-19 VITALS — BP 155/68 | HR 55 | Temp 98.6°F | Wt 187.6 lb

## 2013-07-19 DIAGNOSIS — G609 Hereditary and idiopathic neuropathy, unspecified: Secondary | ICD-10-CM | POA: Diagnosis not present

## 2013-07-19 NOTE — Progress Notes (Signed)
Subjective:    Patient ID: Paul Mays, male    DOB: 05-04-1928, 79 y.o.   MRN: [id number]  HPI   He is requesting referral to [facility]  [facility]urology to reassess his peripheral neuropathy and its treatment. He had been followed by [name] for decades and was responsive to the gabapentin 300 mg 3 tablets up to 5 times a day. He believes he took Lyrica with suboptimal response; this was prescribed by foot specialist.  He is waking up almost every night approximately 3 AM because of the staining in his feet. He denies any restless leg type activity. The additional dose does help but it causes marked sedation.  Intermittently with prolonged driving he'll have a severe tingling in his feet which requires him to pull over and walk for up to 45 minutes.  He saw Dr.Yan 06/07/13 who prescribed Trileptal which was titrated 200 mg twice a day with no response.  The past history concerning his peripheral neuropathy was summarized in the Problem List to enhance continuity of care.  His last TSH 10 B12 levels were therapeutic in April 2012.   Review of Systems   Within the last month he has  weakness in both upper extremities, especially when he works with his hands above his head.  He denies significant weakness, numbness, or tingling in the legs other than the feet issues. He also has no loss control of bladder or bowels.  He has no constitutional symptoms of fever, chills, sweats, or weight loss.       Objective:   Physical Exam Gen.:  well-nourished in appearance. Alert, appropriate and cooperative throughout exam.Appears younger than stated age  Head: Normocephalic without obvious abnormalities;  pattern alopecia  Eyes: No corneal or conjunctival inflammation noted. Pupils equal round reactive to light and accommodation.  Extraocular motion intact.   Neck: No deformities, masses, or tenderness noted. Range of motion decreased. Thyroid small. Lungs: Normal respiratory effort; chest expands  symmetrically. Lungs are clear to auscultation without rales, wheezes, or increased work of breathing. Heart: Slow rate and regular rhythm. Normal S1 and S2. No gallop, click, or rub.Grade 1/6 systolic murmur. Abdomen: Bowel sounds normal; abdomen soft and nontender. No masses or organomegaly. Ventral hernia noted.                              Musculoskeletal/extremities:   Accentuated curvature of upper thoracic  Spine. No clubbing, cyanosis or significant extremity  deformity noted. Range of motion decreased @ knees .Tone & strength  Normal. Trace ankle edema Joints normal . Nail health good. Able to lie down & sit up w/o help. Negative SLR bilaterally Vascular: Carotid, radial artery, dorsalis pedis and  posterior tibial pulses are full and equal. No bruits present. Neurologic: Alert and oriented x3. Deep tendon reflexes symmetrical and normal. Gait is somewhat broad and choppy. There was some retropulsion attempting tiptoe walking.        Skin: Intact without suspicious lesions or rashes. Keratoses Lymph: No cervical, axillary lymphadenopathy present. Psych: Mood and affect are normal. Normally interactive  Assessment & Plan:  See Current Assessment & Plan in Problem List under specific Diagnosis

## 2013-07-19 NOTE — Patient Instructions (Signed)

## 2013-07-19 NOTE — Assessment & Plan Note (Signed)
Repeat TSH & B12 Neurology referral

## 2013-07-20 LAB — VITAMIN B12: Vitamin B-12: 286 pg/mL (ref 211–911)

## 2013-07-20 LAB — TSH: TSH: 1.93 u[IU]/mL (ref 0.35–5.50)

## 2013-07-21 DIAGNOSIS — M25519 Pain in unspecified shoulder: Secondary | ICD-10-CM | POA: Diagnosis not present

## 2013-07-21 DIAGNOSIS — M1A00X0 Idiopathic chronic gout, unspecified site, without tophus (tophi): Secondary | ICD-10-CM | POA: Diagnosis not present

## 2013-07-21 DIAGNOSIS — M159 Polyosteoarthritis, unspecified: Secondary | ICD-10-CM | POA: Diagnosis not present

## 2013-07-27 ENCOUNTER — Ambulatory Visit: Payer: Medicare Other

## 2013-08-05 ENCOUNTER — Other Ambulatory Visit: Payer: Self-pay | Admitting: Neurology

## 2013-08-05 ENCOUNTER — Encounter: Payer: Self-pay | Admitting: Neurology

## 2013-08-05 ENCOUNTER — Ambulatory Visit (INDEPENDENT_AMBULATORY_CARE_PROVIDER_SITE_OTHER): Payer: Medicare Other | Admitting: Neurology

## 2013-08-05 VITALS — BP 126/70 | HR 60 | Temp 97.5°F | Ht 66.0 in | Wt 190.4 lb

## 2013-08-05 DIAGNOSIS — G609 Hereditary and idiopathic neuropathy, unspecified: Secondary | ICD-10-CM | POA: Diagnosis not present

## 2013-08-05 DIAGNOSIS — G629 Polyneuropathy, unspecified: Secondary | ICD-10-CM

## 2013-08-05 NOTE — Patient Instructions (Addendum)
1.  Check blood work today 2.  EKG, which has been scheduled at [facility] located [address] on 08/08/13 at 11:00am. Please arrive at 10:45am.  3.  Return to clinic in 85-month

## 2013-08-05 NOTE — Progress Notes (Signed)
[facility] Clinic Note - Initial Visit   Date: 08/05/2013    [name] MRN: [id number] DOB: [birth year]   Dear Dr Paul Mays:  Thank you for your kind referral of Paul Mays for consultation of neuropathy. Although his history is well known to you, please allow [name] to reiterate it for the purpose of our medical record. The patient was accompanied to the clinic by self.   History of Present Illness: Paul Mays is a 79 y.o. year-old left-handed Caucasian male with history of small SAH s/p fall in 03/2009, vertigo, hyperlipidemia, hypertension, and CAD s/p stent (1999) presenting for evaluation of neuropathy.    Patient has long history of small fiber neuropathy affecting the soles of feet since the in 1990s and was being managed by [name] (last seen in 2012).  Pain is described as "bees stinging and walking on hot coal", constant but intermittently will worsen.  Nothing that exacerbates symptoms, including bed sheets or light pressure.  Walking and neurontin helps.  He takes neurontin 600mg  five times daily, which significantly improves the pain, but it will only last 3 hours.  He does not take the Neurontin on any particular schedule, and seems to take it as needed (11am, 3pm, 8pm, 4AM).  The pain can be severe, where it wakes him up from sleeping. Pain is 10/10 when severe and improved to 1/10 after taking gabapentin.  He was seen recently by Dr. [name] for neuropathy and was recently started on Trileptal but there was no improvement, so he stopped it.  He has previously tried Cymbalta, Lyrica, and Trileptal without any significant relief.  No prior EMG has been done.  Denies any dry eyes, dry mouth, early satiety, impotence, or changes in sweating.  He lives with his wife of 59-years in a two-story home.  He has not had any falls or recent hospitalizations. Of note, his daughter is a retired Insurance account manager.   Out-side paper records, electronic medical record, and  images have been reviewed where available and summarized as:   CT brain 03/15/2009: Atrophy with single tiny focus of subarachnoid hemorrhage at interhemispheric fissure. No other intracranial abnormalities. Large posterior left parietal scalp hematoma. CT cervical spine 03/12/2009: Degenerative changes cervical spine as above, with mild anterolisthesis of C4 on C5, likely degenerative. No acute abnormalities.  Component     Latest Ref Rng 07/19/2013  Vitamin B-12     211 - 911 pg/mL 286  TSH     0.35 - 5.50 uIU/mL 1.93    Past Medical History  Diagnosis Date  . Arthralgia   . Colitis   . Edema   . Venous insufficiency   . Olecranon bursitis   . Peripheral neuropathy   . Murmur   . Carotid stenosis   . Aortic stenosis   . CAD (coronary artery disease)   . Hearing loss   . Anxiety   . Hyperuricemia   . Pulmonary hypertension   . Polyneuropathy   . Gout   . OSA (obstructive sleep apnea)   . Hypertension   . Hyperlipidemia   . Impacted cerumen   . Arthritis   . Migraine   . Unspecified hereditary and idiopathic peripheral neuropathy   . Migraine   . Other and unspecified hyperlipidemia   . Other malaise and fatigue   . Herpes zoster without mention of complication   . Cervicalgia     Past Surgical History  Procedure Laterality Date  . Angioplasty  w/ 2 stents in 1999  . Tonsillectomy    . Appendectomy    . Hemorrhoid surgery    . Lipoma excision       Medications:  Current Outpatient Prescriptions on File Prior to Visit  Medication Sig Dispense Refill  . allopurinol (ZYLOPRIM) 300 MG tablet Take 300 mg by mouth daily.        Marland Kitchen aspirin 81 MG tablet Take 81 mg by mouth daily.        Marland Kitchen COLCRYS 0.6 MG tablet As directed      . gabapentin (NEURONTIN) 300 MG capsule 2 tabs by mouth 5 times daily.      . mesalamine (APRISO) 0.375 G 24 hr capsule Take 375 mg by mouth. Take 4 tabs by mouth each morning       . metoprolol (LOPRESSOR) 50 MG tablet 1/2 tab by mouth  two times a day.       . naproxen sodium (ANAPROX) 220 MG tablet Take 220 mg by mouth 2 (two) times daily as needed.       . pravastatin (PRAVACHOL) 40 MG tablet Take 40 mg by mouth daily.        Marland Kitchen terazosin (HYTRIN) 10 MG capsule Take 10 mg by mouth at bedtime.        No current facility-administered medications on file prior to visit.    Allergies:  Allergies  Allergen Reactions  . Lisinopril     Hyperkalemia in the context of combined spironolactone and lisinopril therapy  . Spironolactone     Hyperkalemia in context of spironolactone and lisinopril therapy    Family History: Family History  Problem Relation Age of Onset  . Stroke Father     Died, 63  . High blood pressure Father   . Diabetes Father   . Diabetes Brother     Died, 4  . Diabetes Sister     Living, 22  . Other Mother     Died, 42  . Healthy Daughter   . Healthy Son     Social History: History   Social History  . Marital Status: Married    Spouse Name: [name]    Number of Children: 2  . Years of Education: college   Occupational History  . retired     worked as a Clinical biochemist  .     Social History Main Topics  . Smoking status: Never Smoker   . Smokeless tobacco: Never Used  . Alcohol Use: 1.2 oz/week    2 Shots of liquor per week     Comment: Before Dinner (Smirnoff Vodka with lemonade) 3 glases   . Drug Use: No  . Sexual Activity: Not on file   Other Topics Concern  . Not on file   Social History Narrative   Pt is married and lives with wife (suzanne). Patient finished  two years of college.    Pt has children.   Caffeine -two cups daily.   Left handed.   Retired Investment banker, corporate                     Review of Systems:  CONSTITUTIONAL: No fevers, chills, night sweats, or weight loss.   EYES: No visual changes or eye pain ENT: No hearing changes.  No history of nose bleeds.   RESPIRATORY: No cough, wheezing and shortness of breath.   CARDIOVASCULAR: Negative for  chest pain, and palpitations.   GI: Negative for abdominal discomfort, blood in stools or black  stools.  No recent change in bowel habits.   GU:  No history of incontinence.   MUSCLOSKELETAL: No history of joint pain or swelling.  No myalgias.   SKIN: Negative for lesions, rash, and itching.   HEMATOLOGY/ONCOLOGY: Negative for prolonged bleeding, bruising easily, and swollen nodes.  ENDOCRINE: Negative for cold or heat intolerance, polydipsia or goiter.   PSYCH:  No depression or anxiety symptoms.   NEURO: As Above.   Vital Signs:  BP 126/70  Pulse 60  Temp(Src) 97.5 F (36.4 C)  Ht 5\' 6"  (1.676 m)  Wt 190 lb 6.4 oz (86.365 kg)  BMI 30.75 kg/m2  Neurological Exam: MENTAL STATUS including orientation to time, place, person, recent and remote memory, attention span and concentration, language, and fund of knowledge is normal.  Speech is not dysarthric.  CRANIAL NERVES: II:  No visual field defects.  Unremarkable fundi.   III-IV-VI: Pupils equal round and reactive to light.  Normal conjugate, extra-ocular eye movements in all directions of gaze.  No nystagmus. Subtle left ptosis (old).   V:  Normal facial sensation.     VII:  Normal facial symmetry and movements.   VIII:  Normal hearing and vestibular function.   IX-X:  Normal palatal movement.   XI:  Normal shoulder shrug and head rotation.   XII:  Normal tongue strength and range of motion, no deviation or fasciculation.  MOTOR:  No atrophy, fasciculations or abnormal movements.  No pronator drift.    Right Upper Extremity:    Left Upper Extremity:    Deltoid  5/5   Deltoid  5/5   Biceps  5/5   Biceps  5/5   Triceps  5/5   Triceps  5/5   Wrist extensors  5/5   Wrist extensors  5/5   Wrist flexors  5/5   Wrist flexors  5/5   Finger extensors  5/5   Finger extensors  5/5   Finger flexors  5/5   Finger flexors  5/5   Dorsal interossei  5/5   Dorsal interossei  5/5   Abductor pollicis  5/5   Abductor pollicis  5/5   Tone  (Ashworth scale)  0  Tone (Ashworth scale)  0   Right Lower Extremity:    Left Lower Extremity:    Hip flexors  5/5   Hip flexors  5/5   Hip extensors  5/5   Hip extensors  5/5   Knee flexors  5/5   Knee flexors  5/5   Knee extensors  5/5   Knee extensors  5/5   Dorsiflexors  5/5   Dorsiflexors  5/5   Plantarflexors  5/5   Plantarflexors  5/5   Toe extensors  5/5   Toe extensors  5/5   Toe flexors  5/5   Toe flexors  5/5   Tone (Ashworth scale)  0  Tone (Ashworth scale)  0   MSRs:  Right                                                                 Left brachioradialis 2+  brachioradialis 2+  biceps 2+  biceps 2+  triceps 2+  triceps 2+  patellar 1+  patellar 1+  ankle jerk 0  ankle jerk 0  Hoffman no  Hoffman no  plantar response down  plantar response down    SENSORY:  Absent pinprick and vibration distal to the knees bilaterally.  Normal and symmetric perception of light touch, temperature, and proprioception.  Romberg's sign absent.   COORDINATION/GAIT: Normal finger-to- nose-finger and heel-to-shin.  Intact rapid alternating movements bilaterally.  Able to rise from a chair without using arms.  Gait narrow based and stable. Tandem and stressed gait intact.     IMPRESSION: [name] is an 79 year old gentleman presenting for evaluation of peripheral neuropathy. His neurological examination shows both small and large fiber loss conforming to a length dependent pattern. He has already had a TSH (normal) and B12 (low normal) that was recently checked. I explained that the most likely etiology of his neuropathy is chronic alcohol consumption (3 mixed drinks with vodka for many years), but for completeness will check for other treatable causes of neuropathy.  Symptomatic management of pain was discussed.  Prior medications that he did not find significant benefit from include Lyrica, Cymbalta, and Trileptal. Going forward, I may consider TCA. I had extensive discussion on  pathogenesis, disease course, and management options.   PLAN/RECOMMENDATIONS:  1. Check the following labs: 2hr glucose tolerance test, copper, ceruloplasmin, MMA, GGT, SPEP/UPEP with IFE 2. EKG for OTc interval 3. Discussed option of scheduling neurontin throughout the day or adding alternative medication, however the patient is happy with his current regimen  4. Return to clinic in 4 weeks  The duration of this appointment visit was 60 minutes of face-to-face time with the patient.  Greater than 50% of this time was spent in counseling, explanation of diagnosis, planning of further management, and coordination of care.   Thank you for allowing me to participate in patient's care.  If I can answer any additional questions, I would be pleased to do so.    Sincerely,    Donika K. Allena Katz, DO

## 2013-08-08 LAB — COPPER, SERUM: Copper: 120 ug/dL (ref 70–175)

## 2013-08-09 ENCOUNTER — Ambulatory Visit (INDEPENDENT_AMBULATORY_CARE_PROVIDER_SITE_OTHER): Payer: Medicare Other | Admitting: *Deleted

## 2013-08-09 VITALS — BP 180/80 | HR 60 | Wt 190.0 lb

## 2013-08-09 DIAGNOSIS — I422 Other hypertrophic cardiomyopathy: Secondary | ICD-10-CM

## 2013-08-09 LAB — METHYLMALONIC ACID, SERUM: Methylmalonic Acid, Quant: 0.18 umol/L (ref <0.40)

## 2013-08-09 LAB — UIFE/LIGHT CHAINS/TP QN, 24-HR UR
Albumin, U: DETECTED
Alpha 1, Urine: DETECTED — AB
Alpha 2, Urine: DETECTED — AB
Beta, Urine: DETECTED — AB
Free Kappa Lt Chains,Ur: 1.12 mg/dL (ref 0.14–2.42)
Gamma Globulin, Urine: DETECTED — AB

## 2013-08-09 NOTE — Progress Notes (Signed)
In for EKG per Dr. Allena Katz.  Reviewed by Dr. Myrtis Ser (DOD).  Is followed by Dr. Excell Seltzer.  Julieta Gutting, RN will f/u to set up future app.

## 2013-08-10 LAB — PROTEIN ELECTROPHORESIS, SERUM, WITH REFLEX
Albumin ELP: 66.2 % — ABNORMAL HIGH (ref 55.8–66.1)
Alpha-2-Globulin: 9.4 % (ref 7.1–11.8)
Beta 2: 3 % — ABNORMAL LOW (ref 3.2–6.5)
Beta Globulin: 6.8 % (ref 4.7–7.2)
Gamma Globulin: 9.6 % — ABNORMAL LOW (ref 11.1–18.8)
Total Protein, Serum Electrophoresis: 6.4 g/dL (ref 6.0–8.3)

## 2013-08-11 ENCOUNTER — Other Ambulatory Visit: Payer: Self-pay

## 2013-08-11 LAB — IGG, IGA, IGM
IgG (Immunoglobin G), Serum: 694 mg/dL (ref 650–1600)
IgM, Serum: 17 mg/dL — ABNORMAL LOW (ref 41–251)

## 2013-08-12 ENCOUNTER — Telehealth: Payer: Self-pay

## 2013-08-12 NOTE — Telephone Encounter (Signed)
Called pt and relayed your message. 

## 2013-08-19 ENCOUNTER — Other Ambulatory Visit: Payer: Self-pay

## 2013-08-19 DIAGNOSIS — I422 Other hypertrophic cardiomyopathy: Secondary | ICD-10-CM

## 2013-08-30 ENCOUNTER — Ambulatory Visit (HOSPITAL_COMMUNITY): Payer: Medicare Other | Attending: Cardiology | Admitting: Radiology

## 2013-08-30 ENCOUNTER — Encounter: Payer: Self-pay | Admitting: Cardiology

## 2013-08-30 DIAGNOSIS — I359 Nonrheumatic aortic valve disorder, unspecified: Secondary | ICD-10-CM | POA: Insufficient documentation

## 2013-08-30 DIAGNOSIS — G4733 Obstructive sleep apnea (adult) (pediatric): Secondary | ICD-10-CM | POA: Diagnosis not present

## 2013-08-30 DIAGNOSIS — I2510 Atherosclerotic heart disease of native coronary artery without angina pectoris: Secondary | ICD-10-CM | POA: Insufficient documentation

## 2013-08-30 DIAGNOSIS — E785 Hyperlipidemia, unspecified: Secondary | ICD-10-CM | POA: Diagnosis not present

## 2013-08-30 DIAGNOSIS — I059 Rheumatic mitral valve disease, unspecified: Secondary | ICD-10-CM | POA: Insufficient documentation

## 2013-08-30 DIAGNOSIS — I10 Essential (primary) hypertension: Secondary | ICD-10-CM | POA: Insufficient documentation

## 2013-08-30 DIAGNOSIS — I422 Other hypertrophic cardiomyopathy: Secondary | ICD-10-CM

## 2013-08-30 DIAGNOSIS — I2789 Other specified pulmonary heart diseases: Secondary | ICD-10-CM | POA: Insufficient documentation

## 2013-08-30 NOTE — Progress Notes (Signed)
Echocardiogram performed.  

## 2013-08-31 ENCOUNTER — Encounter: Payer: Self-pay | Admitting: Cardiovascular Disease

## 2013-08-31 ENCOUNTER — Ambulatory Visit (INDEPENDENT_AMBULATORY_CARE_PROVIDER_SITE_OTHER): Payer: Medicare Other | Admitting: Cardiovascular Disease

## 2013-08-31 VITALS — BP 144/80 | HR 59 | Ht 66.0 in | Wt 192.0 lb

## 2013-08-31 DIAGNOSIS — I421 Obstructive hypertrophic cardiomyopathy: Secondary | ICD-10-CM | POA: Diagnosis not present

## 2013-08-31 NOTE — Patient Instructions (Signed)
Your physician wants you to follow-up in: 1 YEAR with Dr Cooper.  You will receive a reminder letter in the mail two months in advance. If you don't receive a letter, please call our office to schedule the follow-up appointment.  Your physician recommends that you continue on your current medications as directed. Please refer to the Current Medication list given to you today.  

## 2013-08-31 NOTE — Progress Notes (Signed)
HPI:  Paul Mays returns for follow-up evaluation. He is an 78 year old gentleman is followed for coronary artery disease status post remote PCI. He also has asymmetric LVH with septal hypertrophy and outflow tract obstruction. He was last seen in December 2012.  The patient is doing well from a cardiac perspective. He's been having lots of problems related to peripheral neuropathy. He takes gabapentin 5 times per day. This causes some dizziness. He denies palpitations, chest pain, shortness of breath, or leg swelling. He has had no orthopnea, PND, or presyncope.  An echocardiogram was done recently demonstrating typical findings of hypertrophic cardiomyopathy with systolic anterior motion of the mitral valve and asymmetric left ventricular hypertrophy involving the septum.  Outpatient Encounter Prescriptions as of 08/31/2013  Medication Sig  . allopurinol (ZYLOPRIM) 300 MG tablet Take 300 mg by mouth daily.    Marland Kitchen aspirin 81 MG tablet Take 81 mg by mouth daily.    Marland Kitchen COLCRYS 0.6 MG tablet As directed  . gabapentin (NEURONTIN) 300 MG capsule 2 tabs by mouth 5 times daily.  . mesalamine (APRISO) 0.375 G 24 hr capsule Take 375 mg by mouth. Take 4 tabs by mouth each morning   . metoprolol (LOPRESSOR) 50 MG tablet 1/2 tab by mouth two times a day.   . naproxen sodium (ANAPROX) 220 MG tablet Take 220 mg by mouth 2 (two) times daily as needed.   . pravastatin (PRAVACHOL) 40 MG tablet Take 40 mg by mouth daily.    Marland Kitchen terazosin (HYTRIN) 10 MG capsule Take 10 mg by mouth at bedtime.     Allergies  Allergen Reactions  . Lisinopril     Hyperkalemia in the context of combined spironolactone and lisinopril therapy  . Spironolactone     Hyperkalemia in context of spironolactone and lisinopril therapy    Past Medical History  Diagnosis Date  . Arthralgia   . Colitis   . Edema   . Venous insufficiency   . Olecranon bursitis   . Peripheral neuropathy   . Murmur   . Carotid stenosis   . Aortic  stenosis   . CAD (coronary artery disease)   . Hearing loss   . Anxiety   . Hyperuricemia   . Pulmonary hypertension   . Polyneuropathy   . Gout   . OSA (obstructive sleep apnea)   . Hypertension   . Hyperlipidemia   . Impacted cerumen   . Arthritis   . Migraine   . Unspecified hereditary and idiopathic peripheral neuropathy   . Migraine   . Other and unspecified hyperlipidemia   . Other malaise and fatigue   . Herpes zoster without mention of complication   . Cervicalgia     ROS: Negative except as per HPI  BP 144/80  Pulse 59  Ht 5\' 6"  (1.676 m)  Wt 192 lb (87.091 kg)  BMI 31.00 kg/m2  SpO2 98%  PHYSICAL EXAM: Pt is alert and oriented, NAD HEENT: normal Neck: JVP - normal, carotids 2+= without bruits Lungs: CTA bilaterally CV: RRR without murmur or gallop Abd: soft, NT, Positive BS, no hepatomegaly Ext: no C/C/E, distal pulses intact and equal Skin: warm/dry no rash  EKG:    2-D echocardiogram 08/30/2013: Left ventricle: The cavity size was normal. Moderate asymmetric basal septal hypertrophy. Systolic function was normal. The estimated ejection fraction was in the range of 60% to 65%. LV outflow tract peak gradient 22 mmHg. Wall motion was normal; there were no regional wall motion abnormalities. Features are consistent with  a pseudonormal left ventricular filling pattern, with concomitant abnormal relaxation and increased filling pressure (grade 2 diastolic dysfunction). E/medial e' > 15 suggests LV end diastolic pressure at least 20 mmHg.  ------------------------------------------------------------ Aortic valve: Trileaflet; moderately calcified leaflets. Sclerosis without stenosis. Doppler: Mild regurgitation. VTI ratio of LVOT to aortic valve: 0.84. Valve area: 2.9cm^2(VTI). Indexed valve area: 1.48cm^2/m^2 (VTI). Peak velocity ratio of LVOT to aortic valve: 0.8. Valve area: 2.77cm^2 (Vmax). Indexed valve area: 1.41cm^2/m^2  (Vmax).  ------------------------------------------------------------ Aorta: Aortic root: The aortic root was normal in size. Ascending aorta: The ascending aorta was normal in size.  ------------------------------------------------------------ Mitral valve: There was mild systolic anterior motion of the mitral valve. Normal thickness leaflets . Doppler: There was no evidence for stenosis. Mild regurgitation. Peak gradient: 6mm Hg (D).  ------------------------------------------------------------ Left atrium: The atrium was moderately dilated.  ------------------------------------------------------------ Right ventricle: The cavity size was normal. Systolic function was normal.  ------------------------------------------------------------ Pulmonic valve: Structurally normal valve. Cusp separation was normal. Doppler: Transvalvular velocity was within the normal range. Trivial regurgitation.  ------------------------------------------------------------ Tricuspid valve: Doppler: Trivial regurgitation.  ------------------------------------------------------------ Right atrium: The atrium was normal in size.  ------------------------------------------------------------ Pericardium: There was no pericardial effusion.  ------------------------------------------------------------ Systemic veins: Inferior vena cava: The vessel was normal in size; the respirophasic diameter changes were in the normal range (= 50%); findings are consistent with normal central venous pressure.  ------------------------------------------------------------  2D measurements Normal Doppler measurements Normal Left ventricle Main pulmonary LVID ED, 43 mm 43-52 artery chord, Pressure, 52 mm Hg =30 PLAX S LVID ES, 28.7 mm 23-38 Left ventricle chord, Ea, lat 7.71 cm/s ------ PLAX ann, tiss FS, chord, 33 % >29 DP PLAX E/Ea, lat 15.6 ------ LVPW, ED 13.8 mm ------ ann, tiss 9 IVS/LVPW 1.38 <1.3  DP ratio, ED Ea, med 6.02 cm/s ------ Ventricular septum ann, tiss IVS, ED 19.1 mm ------ DP LVOT E/Ea, med 20.1 ------ Diam, S 21 mm ------ ann, tiss Area 3.46 cm^2 ------ DP Diam 21 mm ------ LVOT Aorta Peak vel, 190 cm/s ------ Root diam, 28 mm ------ S ED VTI, S 47.7 cm ------ Left atrium Peak 14 mm Hg ------ AP dim 56 mm ------ gradient, AP dim 2.86 cm/m^2 <2.2 S index Stroke vol 165. ml ------ 2 Stroke 84.3 ml/m^2 ------ index Aortic valve Peak vel, 237 cm/s ------ S Mean vel, 172 cm/s ------ S VTI, S 57 cm ------ VTI ratio 0.84 ------ LVOT/AV Area, VTI 2.9 cm^2 ------ Area index 1.48 cm^2/m ------ (VTI) ^2 Peak vel 0.8 ------ ratio, LVOT/AV Area, Vmax 2.77 cm^2 ------ Area index 1.41 cm^2/m ------ (Vmax) ^2 Regurg PHT 498 ms ------ Mitral valve Peak E vel 121 cm/s ------ Peak A vel 82.9 cm/s ------ Decelerati 201 ms 150-23 on time 0 Peak 6 mm Hg ------ gradient, D Peak E/A 1.5 ------ ratio Max regurg 541 cm/s ------ vel Tricuspid valve Peak RV-RA 44 mm Hg ------ gradient, S Systemic veins Estimated 5 mm Hg ------ CVP Right ventricle Sa vel, 16.8 cm/s ------ lat ann, tiss DP  EKG: 08/09/2013: Sinus bradycardia 51 beats per minute, otherwise within normal limits.   ASSESSMENT AND PLAN: 1. Hypertrophic cardiomyopathy. The patient is doing well with no cardiac symptoms. We discussed consideration of screening for his children, even though it is not known whether this is a senile form of hypertrophic cardiomyopathy which may not be heritable. He is having no concerning symptoms of presyncope, shortness of breath, or findings suggestive of congestive heart failure. He will return for followup evaluation in one year.  2. Coronary artery disease, native vessel. The patient is stable without anginal symptoms.  3. Hypertension. Blood pressure is controlled on metoprolol.  4. Hyperlipidemia. The patient is treated with pravastatin. Last lipids from  2012 showed cholesterol 169, HDL 97, and LDL 66.  Tonny Bollman 08/31/2013 3:07 PM

## 2013-09-06 ENCOUNTER — Ambulatory Visit (INDEPENDENT_AMBULATORY_CARE_PROVIDER_SITE_OTHER): Payer: Medicare Other | Admitting: Neurology

## 2013-09-06 ENCOUNTER — Encounter: Payer: Self-pay | Admitting: Neurology

## 2013-09-06 ENCOUNTER — Ambulatory Visit: Payer: Medicare Other | Admitting: Neurology

## 2013-09-06 VITALS — BP 120/70 | HR 60 | Temp 98.2°F | Ht 66.0 in | Wt 192.6 lb

## 2013-09-06 DIAGNOSIS — G609 Hereditary and idiopathic neuropathy, unspecified: Secondary | ICD-10-CM | POA: Diagnosis not present

## 2013-09-06 DIAGNOSIS — G629 Polyneuropathy, unspecified: Secondary | ICD-10-CM

## 2013-09-06 NOTE — Progress Notes (Signed)
Follow-up Visit   Date: 09/06/2013    Paul Mays MRN: [id number] DOB: 06-01-1928   Interim History: Paul Mays is a delightful 79 y.o. left-handed Caucasian male with history of small SAH s/p fall in 03/2009, vertigo, hyperlipidemia, hypertension, and CAD s/p stent (1999) returning to the clinic for follow-up peripheral neuropathy (diagnosed 1990s).   At his last visit, labs for treatable causes of neuropathy was checked and returned normal.  There has been no interval change in his burning paresthesias.  He continues to take neurontin 600mg  as needed throughout the day which works.  No recent falls or hospitalizations.  He reports that his wife is having increased problems with memory loss and spent the day in the Emergency Department on Thanksgiving because she has an acute confusional spell. Otherwise, he is doing well and in good spirits.  History of present illness: Patient has long history of small fiber neuropathy affecting the soles of feet since the in 1990s and was being managed by [name] (last seen in 2012). Pain is described as "bees stinging and walking on hot coal", constant but intermittently will worsen. Nothing that exacerbates symptoms, including bed sheets or light pressure. Walking and neurontin helps. He takes neurontin 600mg  five times daily, which significantly improves the pain, but it will only last 3 hours. He does not take the Neurontin on any particular schedule, and seems to take it as needed (11am, 3pm, 8pm, 4AM). The pain can be severe, where it wakes him up from sleeping. Pain is 10/10 when severe and improved to 1/10 after taking gabapentin. He was seen recently by Dr. [name] for neuropathy and was recently started on Trileptal but there was no improvement, so he stopped it. He has previously tried Cymbalta, Lyrica, and Trileptal without any significant relief. No prior EMG has been done.  Of note, his daughter is a retired Insurance account manager.   Medications:   Current Outpatient Prescriptions on File Prior to Visit  Medication Sig Dispense Refill  . allopurinol (ZYLOPRIM) 300 MG tablet Take 300 mg by mouth daily.        Marland Kitchen aspirin 81 MG tablet Take 81 mg by mouth daily.        Marland Kitchen COLCRYS 0.6 MG tablet As directed      . gabapentin (NEURONTIN) 300 MG capsule 2 tabs by mouth 5 times daily.      . mesalamine (APRISO) 0.375 G 24 hr capsule Take 375 mg by mouth. Take 4 tabs by mouth each morning       . metoprolol (LOPRESSOR) 50 MG tablet 1/2 tab by mouth two times a day.       . naproxen sodium (ANAPROX) 220 MG tablet Take 220 mg by mouth 2 (two) times daily as needed.       . pravastatin (PRAVACHOL) 40 MG tablet Take 40 mg by mouth daily.        Marland Kitchen terazosin (HYTRIN) 10 MG capsule Take 10 mg by mouth at bedtime.        No current facility-administered medications on file prior to visit.    Allergies:  Allergies  Allergen Reactions  . Lisinopril     Hyperkalemia in the context of combined spironolactone and lisinopril therapy  . Spironolactone     Hyperkalemia in context of spironolactone and lisinopril therapy     Review of Systems:  CONSTITUTIONAL: No fevers, chills, night sweats, or weight loss.   EYES: No visual changes or eye pain ENT: No hearing changes.  No history of nose bleeds.   RESPIRATORY: No cough, wheezing and shortness of breath.   CARDIOVASCULAR: Negative for chest pain, and palpitations.   GI: Negative for abdominal discomfort, blood in stools or black stools.  No recent change in bowel habits.   GU:  No history of incontinence.   MUSCLOSKELETAL: No history of joint pain or swelling.  No myalgias.   SKIN: Negative for lesions, rash, and itching.   ENDOCRINE: Negative for cold or heat intolerance, polydipsia or goiter.   PSYCH:  no depression or anxiety symptoms.   NEURO: As Above.   Vital Signs:  BP 120/70  Pulse 60  Temp(Src) 98.2 F (36.8 C) (Oral)  Ht 5\' 6"  (1.676 m)  Wt 192 lb 9.6 oz (87.363 kg)  BMI 31.10  kg/m2  Neurological Exam: MENTAL STATUS including orientation to time, place, person, recent and remote memory, attention span and concentration, language, and fund of knowledge is normal.  Speech is not dysarthric.  CRANIAL NERVES: II:  No visual field defects.   III-IV-VI: Pupils equal round and reactive to light.  Normal conjugate, extra-ocular eye movements in all directions of gaze.  No nystagmus.    V:  Normal facial sensation.    VII:  Normal facial symmetry and movements.   IX-X:  Normal palatal movement.   XI:  Normal shoulder shrug and head rotation.   XII:  Normal tongue strength and range of motion, no deviation or fasciculation.  MOTOR:  Motor strength is 5/5 throughout. No atrophy, fasciculations or abnormal movements.  No pronator drift.  Tone is normal.    MSRs:  Right                                                                 Left brachioradialis 2+  brachioradialis 2+  biceps 2+  biceps 2+  triceps 2+  triceps 2+  patellar 1+  patellar 1+  ankle jerk 0  ankle jerk 0  Hoffman no  Hoffman no  plantar response down  plantar response down   SENSORY:  Absent pinprick and vibration distal to the knees bilaterally. Romberg's sign absent.   COORDINATION/GAIT:  Gait narrow based and stable.  Stressed gait intact.   Data: 08/09/2013 QTc 400  Component     Latest Ref Rng 07/19/2013 08/05/2013  Vitamin B-12     211 - 911 pg/mL 286   TSH     0.35 - 5.50 uIU/mL 1.93   GGT     7 - 51 U/L  25  Ceruloplasmin     20 - 60 mg/dL  30  Copper     70 - 161 mcg/dL  096  Methylmalonic Acid, Quant     <0.40 umol/L  0.18  SPEP/UPEP - no M protein   IMPRESSION: [name] is a delightful 79 year old gentleman presenting for evaluation of peripheral neuropathy. His neurological examination shows both small and large fiber loss conforming to a length dependent pattern. Etiology of neuropathy is most likely chronic alcohol consumption (3 mixed drinks with vodka for many  years).  Labs looking for other treatable causes of neuropathy was normal.   Symptomatic management of pain was discussed, including taking the neurontin at a scheduled interval, adding extended release neurontin at bedtime or a trial of  tricyclic antidepressant. Risks and benefits of each was discussed and he is very happy with the pain relief that he is getting with taking neurontin as needed because it works for him.    Previously tried medications include:  Lyrica, Cymbalta, and Trileptal.  PLAN/RECOMMENDATIONS:  1.  Continue neurontin 600mg  3-5 times daily as needed 2.  Return to clinic in 56-months, or sooner as needed   The duration of this appointment visit was 30 minutes of face-to-face time with the patient.  Greater than 50% of this time was spent in counseling, explanation of diagnosis, planning of further management, and coordination of care.   Thank you for allowing me to participate in patient's care.  If I can answer any additional questions, I would be pleased to do so.    Sincerely,    Sunshyne Horvath K. Allena Katz, DO

## 2013-09-06 NOTE — Patient Instructions (Addendum)
1. Continue neurontin 600mg  3-5 times daily as needed 2. Return to clinic 85-months

## 2013-10-14 DIAGNOSIS — M25569 Pain in unspecified knee: Secondary | ICD-10-CM | POA: Diagnosis not present

## 2013-10-14 DIAGNOSIS — M1A00X0 Idiopathic chronic gout, unspecified site, without tophus (tophi): Secondary | ICD-10-CM | POA: Diagnosis not present

## 2013-10-14 DIAGNOSIS — M159 Polyosteoarthritis, unspecified: Secondary | ICD-10-CM | POA: Diagnosis not present

## 2013-11-19 ENCOUNTER — Encounter: Payer: Self-pay | Admitting: Internal Medicine

## 2013-11-21 ENCOUNTER — Telehealth: Payer: Self-pay | Admitting: *Deleted

## 2013-11-21 NOTE — Telephone Encounter (Signed)
Spoke with pt and his wife.  Informed him of normal lab results and explained to Mrs. Fluke that she would have to contact Dr. Linna Darner for her results.

## 2013-11-23 ENCOUNTER — Encounter: Payer: Self-pay | Admitting: Internal Medicine

## 2013-11-25 DIAGNOSIS — Z961 Presence of intraocular lens: Secondary | ICD-10-CM | POA: Diagnosis not present

## 2013-11-25 DIAGNOSIS — H35379 Puckering of macula, unspecified eye: Secondary | ICD-10-CM | POA: Diagnosis not present

## 2013-11-29 ENCOUNTER — Ambulatory Visit: Payer: Medicare Other

## 2013-12-05 ENCOUNTER — Ambulatory Visit: Payer: Medicare Other

## 2013-12-05 ENCOUNTER — Encounter: Payer: Self-pay | Admitting: Internal Medicine

## 2013-12-05 ENCOUNTER — Ambulatory Visit (INDEPENDENT_AMBULATORY_CARE_PROVIDER_SITE_OTHER): Payer: Medicare Other | Admitting: Internal Medicine

## 2013-12-05 ENCOUNTER — Other Ambulatory Visit (INDEPENDENT_AMBULATORY_CARE_PROVIDER_SITE_OTHER): Payer: Medicare Other

## 2013-12-05 VITALS — BP 180/98 | HR 68 | Temp 98.3°F | Resp 15 | Wt 189.4 lb

## 2013-12-05 DIAGNOSIS — I10 Essential (primary) hypertension: Secondary | ICD-10-CM | POA: Diagnosis not present

## 2013-12-05 DIAGNOSIS — Z23 Encounter for immunization: Secondary | ICD-10-CM

## 2013-12-05 LAB — BASIC METABOLIC PANEL
BUN: 20 mg/dL (ref 6–23)
CALCIUM: 9.2 mg/dL (ref 8.4–10.5)
CHLORIDE: 106 meq/L (ref 96–112)
CO2: 29 meq/L (ref 19–32)
Creatinine, Ser: 1.2 mg/dL (ref 0.4–1.5)
GFR: 64.38 mL/min (ref >60.00)
Glucose, Bld: 76 mg/dL (ref 70–99)
Potassium: 4.2 mEq/L (ref 3.5–5.1)
Sodium: 141 mEq/L (ref 135–145)

## 2013-12-05 MED ORDER — LOSARTAN POTASSIUM 50 MG PO TABS: 50.0000 mg | ORAL_TABLET | Freq: Every day | ORAL | Status: DC

## 2013-12-05 NOTE — Progress Notes (Signed)
Pre visit review using our clinic review tool, if applicable. No additional management support is needed unless otherwise documented below in the visit note. 

## 2013-12-05 NOTE — Progress Notes (Signed)
   Subjective:    Patient ID: Paul Mays, male    DOB: 1927-11-02, 80 y.o.   MRN: [id number]  HPI Blood pressure range / average is not monitored.  He describes recent stress and feels that this has elevated his blood pressure. Compliant with anti hypertemsive medication.  No lightheadedness or other adverse medication effect described with medication. He did have hyperkalemia on a combination of ACE inhibitor and spironolactone.   Family history is positive for HTN /CVA         Review of Systems Significant headaches, epistaxis, chest pain, palpitations, exertional dyspnea, claudication, paroxysmal nocturnal dyspnea, or edema absent.     Objective:   Physical Exam Appears healthy and well-nourished & in no acute distress.  He appears younger than his stated age  No carotid bruits are present.No neck pain distention present at 10 - 15 degrees. Thyroid normal to palpation  Heart rhythm and rate are normal with Grade 6.6-0 systolic murmur. No gallops.  Chest is clear with no increased work of breathing  There is no evidence of aortic aneurysm or renal artery bruits  Abdomen soft with no organomegaly or masses. No HJR  No clubbing or cyanosis . Trace edema present.  Pedal pulses are intact   No ischemic skin changes are present . Nails healthy . Alert and oriented. Strength, tone, DTRs reflexes normal          Assessment & Plan:  #1 HTN  See orders

## 2013-12-05 NOTE — Patient Instructions (Signed)
Minimal Blood Pressure Goal= AVERAGE < 150/90;  Ideal is an AVERAGE < 135/85. This AVERAGE should be calculated from @ least 5-7 BP readings taken @ different times of day on different days of week. You should not respond to isolated BP readings , but rather the AVERAGE for that week .Please bring your  blood pressure cuff to office visits to verify that it is reliable.It  can also be checked against the blood pressure device at the pharmacy. Finger or wrist cuffs are not dependable; an arm cuff is.

## 2013-12-05 NOTE — Assessment & Plan Note (Signed)
Add spironolactone

## 2013-12-07 ENCOUNTER — Ambulatory Visit: Payer: Medicare Other | Admitting: Nurse Practitioner

## 2013-12-08 ENCOUNTER — Encounter: Payer: Self-pay | Admitting: Internal Medicine

## 2013-12-16 ENCOUNTER — Encounter: Payer: Self-pay | Admitting: Internal Medicine

## 2013-12-23 ENCOUNTER — Encounter: Payer: Self-pay | Admitting: Internal Medicine

## 2013-12-26 ENCOUNTER — Encounter: Payer: Self-pay | Admitting: Internal Medicine

## 2013-12-27 DIAGNOSIS — Z85828 Personal history of other malignant neoplasm of skin: Secondary | ICD-10-CM | POA: Diagnosis not present

## 2013-12-27 DIAGNOSIS — D692 Other nonthrombocytopenic purpura: Secondary | ICD-10-CM | POA: Diagnosis not present

## 2013-12-27 DIAGNOSIS — L570 Actinic keratosis: Secondary | ICD-10-CM | POA: Diagnosis not present

## 2013-12-27 DIAGNOSIS — L738 Other specified follicular disorders: Secondary | ICD-10-CM | POA: Diagnosis not present

## 2014-03-03 DIAGNOSIS — Z8546 Personal history of malignant neoplasm of prostate: Secondary | ICD-10-CM | POA: Diagnosis not present

## 2014-03-03 DIAGNOSIS — C61 Malignant neoplasm of prostate: Secondary | ICD-10-CM | POA: Diagnosis not present

## 2014-03-07 ENCOUNTER — Ambulatory Visit: Payer: Medicare Other | Admitting: Neurology

## 2014-03-07 ENCOUNTER — Other Ambulatory Visit: Payer: Self-pay | Admitting: *Deleted

## 2014-03-07 DIAGNOSIS — I10 Essential (primary) hypertension: Secondary | ICD-10-CM

## 2014-03-07 MED ORDER — LOSARTAN POTASSIUM 50 MG PO TABS
50.0000 mg | ORAL_TABLET | Freq: Every day | ORAL | Status: DC
Start: 2014-03-07 — End: 2014-08-14

## 2014-03-14 DIAGNOSIS — M1A00X0 Idiopathic chronic gout, unspecified site, without tophus (tophi): Secondary | ICD-10-CM | POA: Diagnosis not present

## 2014-03-14 DIAGNOSIS — M159 Polyosteoarthritis, unspecified: Secondary | ICD-10-CM | POA: Diagnosis not present

## 2014-04-14 DIAGNOSIS — L570 Actinic keratosis: Secondary | ICD-10-CM | POA: Diagnosis not present

## 2014-04-14 DIAGNOSIS — Z85828 Personal history of other malignant neoplasm of skin: Secondary | ICD-10-CM | POA: Diagnosis not present

## 2014-04-14 DIAGNOSIS — D692 Other nonthrombocytopenic purpura: Secondary | ICD-10-CM | POA: Diagnosis not present

## 2014-04-14 DIAGNOSIS — L819 Disorder of pigmentation, unspecified: Secondary | ICD-10-CM | POA: Diagnosis not present

## 2014-04-18 ENCOUNTER — Encounter: Payer: Self-pay | Admitting: Neurology

## 2014-04-18 ENCOUNTER — Ambulatory Visit (INDEPENDENT_AMBULATORY_CARE_PROVIDER_SITE_OTHER): Payer: Medicare Other | Admitting: Neurology

## 2014-04-18 VITALS — BP 134/64 | HR 57 | Ht 64.57 in | Wt 190.2 lb

## 2014-04-18 DIAGNOSIS — G609 Hereditary and idiopathic neuropathy, unspecified: Secondary | ICD-10-CM | POA: Diagnosis not present

## 2014-04-18 NOTE — Patient Instructions (Signed)
It was great to see you. Continue your medications as you are taking them. I'll see you back in 42-months

## 2014-04-18 NOTE — Progress Notes (Signed)
Follow-up Visit   Date: 04/18/2014    Paul Mays MRN: [id number] DOB: [birth year]   Interim History: Paul Mays is a delightful 80 y.o. left-handed Caucasian male with history of small SAH s/p fall in 03/2009, vertigo, hyperlipidemia, hypertension, and CAD s/p stent (1999) returning to the clinic for follow-up peripheral neuropathy (diagnosed 1990s).  She was last seen in the clinic on 09/06/2013.  History of present illness: Patient has long history of small fiber neuropathy affecting the soles of feet since the in 1990s and was being managed by Dr. Erling Cruz (last seen in 2012). Pain is described as "bees stinging and walking on hot coal", constant but intermittently will worsen. Nothing that exacerbates symptoms, including bed sheets or light pressure. Walking and neurontin helps. He takes neurontin 600mg  five times daily, which significantly improves the pain, but it will only last 3 hours. He does not take the Neurontin on any particular schedule, and seems to take it as needed (11am, 3pm, 8pm, 4AM). The pain can be severe, where it wakes him up from sleeping. Pain is 10/10 when severe and improved to 1/10 after taking gabapentin. He was seen recently by Dr. Krista Blue for neuropathy and was recently started on Trileptal but there was no improvement, so he stopped it. He has previously tried Cymbalta, Lyrica, and Trileptal without any significant relief. No prior EMG has been done.  Of note, his daughter is a retired Garment/textile technologist.  UPDATE 04/18/2014:  He has been doing well, with no worsening of neuropathy.  No interval falls, hospitalizations, or falls. He continues to take neurontin 600mg  as needed throughout the day which works.   Medications:  Current Outpatient Prescriptions on File Prior to Visit  Medication Sig Dispense Refill  . allopurinol (ZYLOPRIM) 300 MG tablet Take 300 mg by mouth daily.        Marland Kitchen aspirin 81 MG tablet Take 81 mg by mouth daily.        Marland Kitchen COLCRYS 0.6 MG tablet As  directed      . gabapentin (NEURONTIN) 300 MG capsule 2 tabs by mouth 5 times daily.      Marland Kitchen losartan (COZAAR) 50 MG tablet Take 1 tablet (50 mg total) by mouth daily.  30 tablet  4  . mesalamine (APRISO) 0.375 G 24 hr capsule Take 375 mg by mouth. Take 4 tabs by mouth each morning       . metoprolol (LOPRESSOR) 50 MG tablet 1/2 tab by mouth two times a day.       . naproxen sodium (ANAPROX) 220 MG tablet Take 220 mg by mouth 2 (two) times daily as needed.       . pravastatin (PRAVACHOL) 40 MG tablet Take 40 mg by mouth daily.        Marland Kitchen terazosin (HYTRIN) 10 MG capsule Take 10 mg by mouth at bedtime.        No current facility-administered medications on file prior to visit.    Allergies:  Allergies  Allergen Reactions  . Lisinopril     Hyperkalemia in the context of combined spironolactone and lisinopril therapy  . Spironolactone     Hyperkalemia in context of spironolactone and lisinopril therapy     Review of Systems:  CONSTITUTIONAL: No fevers, chills, night sweats, or weight loss.   EYES: No visual changes or eye pain ENT: No hearing changes.  No history of nose bleeds.   RESPIRATORY: No cough, wheezing and shortness of breath.   CARDIOVASCULAR: Negative for chest  pain, and palpitations.   GI: Negative for abdominal discomfort, blood in stools or black stools.  No recent change in bowel habits.   GU:  No history of incontinence.   MUSCLOSKELETAL: No history of joint pain or swelling.  No myalgias.   SKIN: Negative for lesions, rash, and itching.   ENDOCRINE: Negative for cold or heat intolerance, polydipsia or goiter.   PSYCH:  no depression or anxiety symptoms.   NEURO: As Above.   Vital Signs:  BP 134/64  Pulse 57  Ht 5' 4.57" (1.64 m)  Wt 190 lb 4 oz (86.297 kg)  BMI 32.09 kg/m2  SpO2 95%  Neurological Exam: MENTAL STATUS including orientation to time, place, person, recent and remote memory, attention span and concentration, language, and fund of knowledge is  normal.  Speech is not dysarthric.  CRANIAL NERVES:  No visual field defects.  Pupils equal round and reactive to light.  Normal conjugate, extra-ocular eye movements in all directions of gaze.  No nystagmus.   Face is symmetric.  Palate elevates symmetrically.  Tongue is midline.  MOTOR:  Motor strength is 5/5 throughout. No pronator drift.  Tone is normal.    MSRs:  Right                                                                 Left brachioradialis 2+  brachioradialis 2+  biceps 2+  biceps 2+  triceps 2+  triceps 2+  patellar 1+  patellar 1+  ankle jerk 0  ankle jerk 0  Hoffman no  Hoffman no  plantar response down  plantar response down   SENSORY:  Absent vibration distal to the knees bilaterally.   COORDINATION/GAIT:  Gait narrow based and stable.    Data: 08/09/2013 QTc 400 Labs 07/19/2013:  Vitamin B12 286, TSH 1.93, GGT 25, ceruloplasmin 30, copper 120, MMA 0.18, SPEP/UPEP - no M protein   IMPRESSION: [name] is a delightful 80 year old gentleman returning for evaluation of peripheral neuropathy. His neurological examination shows both small and large fiber loss conforming to a length dependent pattern. Etiology of neuropathy is most likely chronic alcohol consumption (3 mixed drinks with vodka for many years).  Labs looking for other treatable causes of neuropathy was normal.   He has been doing well with no worsening of symptoms and he is very happy with the pain relief that he is getting with taking neurontin as needed.  Previously tried medications include:  Lyrica, Cymbalta, and Trileptal.  PLAN/RECOMMENDATIONS:  1.  Continue neurontin 600mg  3-5 times daily as needed 2.  Return to clinic in 43-months, or sooner as needed   The duration of this appointment visit was 25 minutes of face-to-face time with the patient.  Greater than 50% of this time was spent in counseling, explanation of diagnosis, planning of further management, and coordination of care.   Thank  you for allowing me to participate in patient's care.  If I can answer any additional questions, I would be pleased to do so.    Sincerely,    Lakasha Mcfall K. Posey Pronto, DO

## 2014-06-16 ENCOUNTER — Telehealth: Payer: Self-pay | Admitting: Internal Medicine

## 2014-06-16 NOTE — Telephone Encounter (Signed)
Patient Information:  Caller Name: Paul Mays  Phone: [phone]  Patient: Paul Mays, Paul Mays  Gender: Male  DOB: 1927/11/16  Age: 80 Years  PCP: [name]  Office Follow Up:  Does the office need to follow up with this patient?: Yes  Instructions For The Office: Poison control contacted information provided.  RN Note:  [place] - Patient may have side effects of-upset stomach, dizziness- Advised to eat well , drink fluids. increase fluids 1 1/2 cup per hour, change positions slowly have someone with him. Call 911 for dizziness, vomiting . Follow up with Poison Control in 2 hours for recheck. Patient provided with Poison Control phone number.  Symptoms  Reason For Call & Symptoms: Patient states he mixed up his medicine and Took his Terazosin- Hytrin 10mg  x4 tablets. 30 minutes ago. Awake and oriented. Wife is at home with patient. Contacted Poision Control for guidance.  Reviewed Health History In EMR: Yes  Reviewed Medications In EMR: Yes  Reviewed Allergies In EMR: Yes  Reviewed Surgeries / Procedures: Yes  Date of Onset of Symptoms: 06/16/2014  Guideline(s) Used:  Poisoning  Disposition Per Guideline:   Call [place] Now  Reason For Disposition Reached:   All OTHER HARMFUL SUBSTANCES (e.g., nearly all drugs, plants, and chemicals) (EXCEPTION: harmless substances or harmless overdose such as double dose of OTC drug or antibiotic)  Advice Given:  Call Back If:  You have any more questions  You become worse.  Patient Will Follow Care Advice:  YES

## 2014-06-16 NOTE — Telephone Encounter (Signed)
Spoke w/ pt, he states that he is doing fine and that he was advised by the call a nurse company to call them around 4: 00 pm today to give them an update.  i asked the pt if he felt that he needed to be seen in the clinic and pt declined.

## 2014-06-30 ENCOUNTER — Encounter: Payer: Self-pay | Admitting: Internal Medicine

## 2014-06-30 ENCOUNTER — Ambulatory Visit (INDEPENDENT_AMBULATORY_CARE_PROVIDER_SITE_OTHER): Payer: Medicare Other | Admitting: Internal Medicine

## 2014-06-30 ENCOUNTER — Other Ambulatory Visit (INDEPENDENT_AMBULATORY_CARE_PROVIDER_SITE_OTHER): Payer: Medicare Other

## 2014-06-30 VITALS — BP 132/72 | HR 50 | Temp 98.1°F | Resp 13 | Wt 187.0 lb

## 2014-06-30 DIAGNOSIS — Z8719 Personal history of other diseases of the digestive system: Secondary | ICD-10-CM | POA: Diagnosis not present

## 2014-06-30 DIAGNOSIS — R1013 Epigastric pain: Secondary | ICD-10-CM

## 2014-06-30 DIAGNOSIS — R195 Other fecal abnormalities: Secondary | ICD-10-CM | POA: Diagnosis not present

## 2014-06-30 DIAGNOSIS — Z23 Encounter for immunization: Secondary | ICD-10-CM | POA: Diagnosis not present

## 2014-06-30 DIAGNOSIS — K5730 Diverticulosis of large intestine without perforation or abscess without bleeding: Secondary | ICD-10-CM

## 2014-06-30 LAB — CBC WITH DIFFERENTIAL/PLATELET
BASOS ABS: 0 10*3/uL (ref 0.0–0.1)
Basophils Relative: 0.4 % (ref 0.0–3.0)
EOS ABS: 0.1 10*3/uL (ref 0.0–0.7)
Eosinophils Relative: 2.3 % (ref 0.0–5.0)
HCT: 40 % (ref 39.0–52.0)
Hemoglobin: 13.7 g/dL (ref 13.0–17.0)
LYMPHS PCT: 19.8 % (ref 12.0–46.0)
Lymphs Abs: 0.9 10*3/uL (ref 0.7–4.0)
MCHC: 34.3 g/dL (ref 30.0–36.0)
MCV: 96.8 fl (ref 78.0–100.0)
MONO ABS: 0.5 10*3/uL (ref 0.1–1.0)
Monocytes Relative: 9.8 % (ref 3.0–12.0)
NEUTROS PCT: 67.7 % (ref 43.0–77.0)
Neutro Abs: 3.1 10*3/uL (ref 1.4–7.7)
PLATELETS: 117 10*3/uL — AB (ref 150.0–400.0)
RBC: 4.14 Mil/uL — ABNORMAL LOW (ref 4.22–5.81)
RDW: 15.4 % (ref 11.5–15.5)
WBC: 4.6 10*3/uL (ref 4.0–10.5)

## 2014-06-30 LAB — HEPATIC FUNCTION PANEL
ALT: 19 U/L (ref 0–53)
AST: 21 U/L (ref 0–37)
Albumin: 4.1 g/dL (ref 3.5–5.2)
Alkaline Phosphatase: 27 U/L — ABNORMAL LOW (ref 39–117)
BILIRUBIN TOTAL: 2 mg/dL — AB (ref 0.2–1.2)
Bilirubin, Direct: 0.3 mg/dL (ref 0.0–0.3)
Total Protein: 6.5 g/dL (ref 6.0–8.3)

## 2014-06-30 LAB — AMYLASE: AMYLASE: 65 U/L (ref 27–131)

## 2014-06-30 LAB — LIPASE: LIPASE: 35 U/L (ref 11.0–59.0)

## 2014-06-30 MED ORDER — OMEPRAZOLE 20 MG PO CPDR: DELAYED_RELEASE_CAPSULE | ORAL | Status: DC

## 2014-06-30 NOTE — Patient Instructions (Signed)
Reflux of gastric acid may be asymptomatic as this may occur mainly during sleep.The triggers for reflux  include stress; the "aspirin family" ; alcohol; peppermint; and caffeine (coffee, tea, cola, and chocolate). The aspirin family would include aspirin and the nonsteroidal agents such as ibuprofen &  Naproxen. Tylenol would not cause reflux. If having symptoms ; food & drink should be avoided for @ least 2 hours before going to bed. Take the protein pump inhibitor 30 minutes before breakfast and 30 minutes before the evening meal for 8 weeks then go back to once a day  30 minutes before breakfast.  Your next office appointment will be determined based upon review of your pending labs . Those instructions will be transmitted to you through My Chart  OR  by mail Followup as needed for your acute issue. Please report any significant change in your symptoms.

## 2014-06-30 NOTE — Progress Notes (Signed)
   Subjective:    Patient ID: Paul Mays, male    DOB: 1927-10-29, 80 y.o.   MRN: [id number]  HPI    He describes aching midabdominal pain since 9/40/15. It is intermittent and can last hours. It is nonradiating.  Pain is  associated with increased production of gas.  He states that over the past week he's had dark tarry stool. He did take Pepto-Bismol for the discomfort.  He has Anaprox listed; but he is unsure whether he is taking it. He takes colchicine only as needed. He is on a prophylactic baby aspirin  He has a history of ulcer in his teens. He is on mesalamine from Dr. Henrene Pastor for colitis. Last colonoscopy 3/19/ 2013 revealed moderate Diverticulosis.   Review of Systems   He denies dysphagia,anorexia, hematemesis, weight change, fever, chills, or sweats.   There is no associated back pain that radiates anteriorly.   He also has no dysuria, pyuria, or hematuria.     Objective:   Physical Exam  Positive or pertinent findings include: There is a bradycardia with a grade 1 systolic murmur. Abdomen is protuberant and tender in the periumbilical area. Ventral hernia is present There is some tenting and bruising of his hands. He has valgus deformity of the knees with some crepitus on the left. Rectal exam reveals scant , soft grayish stool which is Hemoccult negative. Prostate is small  General appearance :adequately nourished; in no distress. Eyes: No conjunctival inflammation or scleral icterus is present. Oral exam: Dental hygiene is good. Lips and gums are healthy appearing.There is no oropharyngeal erythema or exudate noted.  Heart:  Regular rhythm. S1 and S2 normal without gallop, murmur, click, rub or other extra sounds   Lungs:Chest clear to auscultation; no wheezes, rhonchi,rales ,or rubs present.No increased work of breathing.  Abdomen: active bowel sounds normal, soft  without masses or organomegaly noted.  No guarding or rebound. No flank tenderness to  percussion. Skin:Warm & dry.  Intact without suspicious lesions or rashes ; no jaundice Lymphatic: No lymphadenopathy is noted about the head, neck, axilla        Assessment & Plan:   #1 midabdominal pain; gastritis versus colitis flare. #2 dark stool from [place]: See orders and recommendations

## 2014-06-30 NOTE — Progress Notes (Signed)
Pre visit review using our clinic review tool, if applicable. No additional management support is needed unless otherwise documented below in the visit note. 

## 2014-07-02 DIAGNOSIS — K5730 Diverticulosis of large intestine without perforation or abscess without bleeding: Secondary | ICD-10-CM | POA: Insufficient documentation

## 2014-08-14 ENCOUNTER — Ambulatory Visit (INDEPENDENT_AMBULATORY_CARE_PROVIDER_SITE_OTHER): Payer: Medicare Other | Admitting: Internal Medicine

## 2014-08-14 ENCOUNTER — Encounter: Payer: Self-pay | Admitting: Internal Medicine

## 2014-08-14 VITALS — BP 158/68 | HR 51 | Temp 97.7°F | Resp 14 | Wt 182.5 lb

## 2014-08-14 DIAGNOSIS — J209 Acute bronchitis, unspecified: Secondary | ICD-10-CM

## 2014-08-14 DIAGNOSIS — I10 Essential (primary) hypertension: Secondary | ICD-10-CM | POA: Diagnosis not present

## 2014-08-14 MED ORDER — AZITHROMYCIN 250 MG PO TABS: ORAL_TABLET | ORAL | Status: DC

## 2014-08-14 MED ORDER — LOSARTAN POTASSIUM 50 MG PO TABS: 50.0000 mg | ORAL_TABLET | Freq: Every day | ORAL | Status: DC

## 2014-08-14 MED ORDER — HYDROCODONE-HOMATROPINE 5-1.5 MG/5ML PO SYRP: 5.0000 mL | ORAL_SOLUTION | Freq: Four times a day (QID) | ORAL | Status: DC | PRN

## 2014-08-14 NOTE — Patient Instructions (Signed)
Carry room temperature water and sip liberally after coughing. 

## 2014-08-14 NOTE — Progress Notes (Signed)
Pre visit review using our clinic review tool, if applicable. No additional management support is needed unless otherwise documented below in the visit note. 

## 2014-08-14 NOTE — Progress Notes (Signed)
   Subjective:    Patient ID: Paul Mays, male    DOB: July 21, 1928, 80 y.o.   MRN: [id number]  HPI Symptoms began Friday 08/04/14 as a cough which has persisted. He also had sore throat at that time which has resolved  The coughing spells are  intermittent throughout the day  He's used Mucinex and rested with benefit.  He's had some fever, chills, sweats  He's also had some discolored sputum from the chest;this has become lighter. He has some associated sneezing  He has no active upper respiratory tract symptoms.  He's never smoked.      Review of Systems Frontal headache, facial pain , nasal purulence, dental pain, sore throat , otic pain or otic discharge denied.  .     Objective:   Physical Exam  General appearance:good health ;well nourished; no acute distress or increased work of breathing is present.  No  lymphadenopathy about the head, neck, or axilla noted.   Eyes: No conjunctival inflammation or lid edema is present. There is no scleral icterus.  Ears:  External ear exam shows no significant lesions or deformities.  Wax on R; L TM WNL Nose:  External nasal examination shows no deformity or inflammation. Nasal mucosa are pink and moist without lesions or exudates. No septal dislocation or deviation.No obstruction to airflow.   Oral exam: Dental hygiene is good; lips and gums are healthy appearing.There is no oropharyngeal erythema or exudate noted.   Neck:  No deformities, thyromegaly, masses, or tenderness noted.   Supple with full range of motion without pain.   Heart:  Normal rate and regular rhythm. S1 and S2 normal without gallop, murmur, click, rub or other extra sounds.   Lungs:Chest clear to auscultation; no wheezes, rhonchi,rales ,or rubs present.No increased work of breathing.    Extremities:  No cyanosis, edema, or clubbing  noted    Skin: Warm & dry w/o jaundice or tenting.          Assessment & Plan:  #1 acute bronchitis w/o  bronchospasm  Plan: See orders and recommendations

## 2014-09-04 ENCOUNTER — Ambulatory Visit (INDEPENDENT_AMBULATORY_CARE_PROVIDER_SITE_OTHER): Payer: Medicare Other | Admitting: Cardiovascular Disease

## 2014-09-04 ENCOUNTER — Encounter: Payer: Self-pay | Admitting: Cardiovascular Disease

## 2014-09-04 VITALS — BP 138/62 | HR 59 | Ht 64.8 in | Wt 186.6 lb

## 2014-09-04 DIAGNOSIS — I2510 Atherosclerotic heart disease of native coronary artery without angina pectoris: Secondary | ICD-10-CM | POA: Diagnosis not present

## 2014-09-04 DIAGNOSIS — I359 Nonrheumatic aortic valve disorder, unspecified: Secondary | ICD-10-CM

## 2014-09-04 DIAGNOSIS — I422 Other hypertrophic cardiomyopathy: Secondary | ICD-10-CM

## 2014-09-04 DIAGNOSIS — E785 Hyperlipidemia, unspecified: Secondary | ICD-10-CM | POA: Diagnosis not present

## 2014-09-04 NOTE — Progress Notes (Signed)
Background: The patient has been followed for coronary artery disease status post remote PCI. He also has asymmetric LVH with septal hypertrophy and outflow tract obstruction.  HPI:  80 year old gentleman presenting for follow-up evaluation. He has been doing well from a cardiac perspective. He walks his dog once or twice per day without symptoms. He specifically denies chest pain, shortness of breath, lightheadedness, or syncope. His medications have not changed.  Studies:  2-D echocardiogram 08/30/2013: Study Conclusions  - Left ventricle: The cavity size was normal. Moderate asymmetric basal septal hypertrophy. Systolic function was normal. The estimated ejection fraction was in the range of 60% to 65%. LV outflow tract peak gradient 22 mmHg. Wall motion was normal; there were no regional wall motion abnormalities. Features are consistent with a pseudonormal left ventricular filling pattern, with concomitant abnormal relaxation and increased filling pressure (grade 2 diastolic dysfunction). E/medial e' > 15 suggests LV end diastolic pressure at least 20 mmHg. - Aortic valve: Trileaflet; moderately calcified leaflets. Sclerosis without stenosis. Mild regurgitation. - Mitral valve: There was mild systolic anterior motion of the mitral valve. Mild regurgitation. - Left atrium: The atrium was moderately dilated. - Right ventricle: The cavity size was normal. Systolic function was normal. - Tricuspid valve: Peak RV-RA gradient: 72mm Hg (S). - Pulmonary arteries: PA peak pressure: 23mm Hg (S). - Inferior vena cava: The vessel was normal in size; the respirophasic diameter changes were in the normal range (= 50%); findings are consistent with normal central venous pressure. Impressions:  - Normal LV size with moderate asymmetric basal septal hypertrophy. EF 60-65%. Mild LV outflow tract gradient. Aortic sclerosis without stenosis. Mild AI. There  was mild mitral valve systolic anterior motion with mild mitral regurgitation. Moderate LV diastolic dysfunction with evidence for elevated LV filling pressure. Moderate pulmonary hypertension. Normal RV size and systolic function. Study is consistent with HOCM.  Outpatient Encounter Prescriptions as of 09/04/2014  Medication Sig  . allopurinol (ZYLOPRIM) 300 MG tablet Take 300 mg by mouth as needed.   . COLCRYS 0.6 MG tablet As directed  . gabapentin (NEURONTIN) 300 MG capsule Take 600 mg by mouth 2 (two) times daily.   Marland Kitchen losartan (COZAAR) 50 MG tablet Take 1 tablet (50 mg total) by mouth daily.  . mesalamine (APRISO) 0.375 G 24 hr capsule Take 375 mg by mouth. Take 4 tabs by mouth each morning   . metoprolol (LOPRESSOR) 50 MG tablet 1/2 tab by mouth two times a day.   . pravastatin (PRAVACHOL) 40 MG tablet Take 40 mg by mouth daily.    . [DISCONTINUED] aspirin 81 MG tablet Take 81 mg by mouth daily.    . [DISCONTINUED] azithromycin (ZITHROMAX Z-PAK) 250 MG tablet 2 day 1, then 1 qd (Patient not taking: Reported on 09/04/2014)  . [DISCONTINUED] HYDROcodone-homatropine (HYDROMET) 5-1.5 MG/5ML syrup Take 5 mLs by mouth every 6 (six) hours as needed for cough. (Patient not taking: Reported on 09/04/2014)  . [DISCONTINUED] losartan (COZAAR) 50 MG tablet Take 1 tablet (50 mg total) by mouth daily.  . [DISCONTINUED] naproxen sodium (ANAPROX) 220 MG tablet Take 220 mg by mouth 2 (two) times daily as needed.   . [DISCONTINUED] omeprazole (PRILOSEC) 20 MG capsule 1 bid pre meals (Patient not taking: Reported on 09/04/2014)  . [DISCONTINUED] terazosin (HYTRIN) 10 MG capsule Take 10 mg by mouth at bedtime.     Allergies  Allergen Reactions  . Lisinopril     Hyperkalemia in the context of combined spironolactone and lisinopril therapy  .  Spironolactone     Hyperkalemia in context of spironolactone and lisinopril therapy    Past Medical History  Diagnosis Date  . Arthralgia   .  Colitis   . Edema   . Venous insufficiency   . Olecranon bursitis   . Peripheral neuropathy   . Murmur   . Carotid stenosis   . Aortic stenosis   . CAD (coronary artery disease)   . Hearing loss   . Anxiety   . Hyperuricemia   . Pulmonary hypertension   . Polyneuropathy   . Gout   . OSA (obstructive sleep apnea)   . Hypertension   . Hyperlipidemia   . Impacted cerumen   . Arthritis   . Migraine   . Unspecified hereditary and idiopathic peripheral neuropathy   . Migraine   . Other and unspecified hyperlipidemia   . Other malaise and fatigue   . Herpes zoster without mention of complication   . Cervicalgia     family history includes Diabetes in his brother, father, and sister; Healthy in his daughter and son; High blood pressure in his father; Other in his mother; Stroke in his father.   ROS: Negative except as per HPI  BP 138/62 mmHg  Pulse 59  Ht 5' 4.8" (1.646 m)  Wt 186 lb 9.6 oz (84.641 kg)  BMI 31.24 kg/m2  PHYSICAL EXAM: Pt is alert and oriented, pleasant elderly male in NAD HEENT: normal Neck: JVP - normal, carotids 2+= without bruits Lungs: CTA bilaterally CV: RRR grade 2/6 midsystolic murmur at the left sternal border Abd: soft, NT, Positive BS, no hepatomegaly Ext: no C/C/E, distal pulses intact and equal Skin: warm/dry no rash  EKG:  Sinus rhythm 59 bpm, nonspecific T wave abnormality.  ASSESSMENT AND PLAN: 1. Hypertrophic cardiomyopathy. The patient is doing well with no cardiac symptoms. I will see him back in about 6 months. His physical exam is unchanged.  2. Coronary artery disease, native vessel. The patient is stable without anginal symptoms.  3. Hypertension. Blood pressure is controlled on metoprolol.  4. Hyperlipidemia. The patient is treated with pravastatin. Recent liver function tests in September 2015 were within normal limits. Lipid Panel     Component Value Date/Time   CHOL 169 12/12/2010 0957   TRIG 33.0 12/12/2010 0957   HDL  96.80 12/12/2010 0957   CHOLHDL 2 12/12/2010 0957   VLDL 6.6 12/12/2010 0957   LDLCALC 66 12/12/2010 0957     Sherren Mocha, MD 09/04/2014 1:52 PM

## 2014-09-04 NOTE — Patient Instructions (Signed)
Your physician wants you to follow-up in: 6 MONTHS with Dr Cooper.  You will receive a reminder letter in the mail two months in advance. If you don't receive a letter, please call our office to schedule the follow-up appointment.  Your physician recommends that you continue on your current medications as directed. Please refer to the Current Medication list given to you today.  

## 2014-09-05 ENCOUNTER — Encounter: Payer: Self-pay | Admitting: Cardiovascular Disease

## 2014-09-05 NOTE — Telephone Encounter (Signed)
This encounter was created in error - please disregard.

## 2014-09-05 NOTE — Telephone Encounter (Signed)
New message     Talk to Ander Purpura only

## 2014-10-16 ENCOUNTER — Telehealth: Payer: Self-pay | Admitting: Neurology

## 2014-10-16 NOTE — Telephone Encounter (Signed)
Pt called to cancel his f/u for 10/20/14. Pt did not give a reason but he did comment that he will call later to r/s.

## 2014-10-20 ENCOUNTER — Ambulatory Visit: Payer: Medicare Other | Admitting: Neurology

## 2014-11-03 DIAGNOSIS — Z85828 Personal history of other malignant neoplasm of skin: Secondary | ICD-10-CM | POA: Diagnosis not present

## 2014-11-03 DIAGNOSIS — D1801 Hemangioma of skin and subcutaneous tissue: Secondary | ICD-10-CM | POA: Diagnosis not present

## 2014-11-03 DIAGNOSIS — L821 Other seborrheic keratosis: Secondary | ICD-10-CM | POA: Diagnosis not present

## 2014-11-03 DIAGNOSIS — L570 Actinic keratosis: Secondary | ICD-10-CM | POA: Diagnosis not present

## 2014-11-03 DIAGNOSIS — D692 Other nonthrombocytopenic purpura: Secondary | ICD-10-CM | POA: Diagnosis not present

## 2014-11-03 DIAGNOSIS — D170 Benign lipomatous neoplasm of skin and subcutaneous tissue of head, face and neck: Secondary | ICD-10-CM | POA: Diagnosis not present

## 2014-11-10 ENCOUNTER — Telehealth: Payer: Self-pay

## 2014-11-10 MED ORDER — GABAPENTIN 300 MG PO CAPS: 600.0000 mg | ORAL_CAPSULE | Freq: Two times a day (BID) | ORAL | Status: DC

## 2014-11-10 NOTE — Telephone Encounter (Signed)
For 1 mo

## 2014-11-10 NOTE — Telephone Encounter (Signed)
This has been sent to [facility] per patient request

## 2014-11-10 NOTE — Telephone Encounter (Signed)
Incoming call from patient. Would like you to fill Gabapentin 600 mg bid. [facility] before they will fill it. He has an appointment with them on 11/28/14. But will be out of the medication and they will not fill it. Do you want to fill the Gabapentin for him?

## 2014-11-14 ENCOUNTER — Telehealth: Payer: Self-pay | Admitting: Internal Medicine

## 2014-11-14 NOTE — Telephone Encounter (Signed)
Patient states that he was advised of authorization to go to [place] being sent to him. He states that he has not received this. I do not see any documentation regarding it. Please advise?

## 2014-11-14 NOTE — Telephone Encounter (Signed)
I don't understand; he should not need authorization We did fill a [facility] Rx until he could F/U there

## 2014-11-27 DIAGNOSIS — H35371 Puckering of macula, right eye: Secondary | ICD-10-CM | POA: Diagnosis not present

## 2014-11-27 DIAGNOSIS — Z961 Presence of intraocular lens: Secondary | ICD-10-CM | POA: Diagnosis not present

## 2014-12-25 ENCOUNTER — Other Ambulatory Visit: Payer: Self-pay

## 2014-12-25 MED ORDER — GABAPENTIN 300 MG PO CAPS: 600.0000 mg | ORAL_CAPSULE | Freq: Two times a day (BID) | ORAL | Status: DC

## 2015-01-05 ENCOUNTER — Encounter: Payer: Self-pay | Admitting: Internal Medicine

## 2015-01-05 NOTE — Progress Notes (Signed)
11.30.15 ekg  11.9.15 last office visit with Dr Linna Darner  9.25.16 labs Have been faxed to [facility] [phone]

## 2015-01-09 DIAGNOSIS — M150 Primary generalized (osteo)arthritis: Secondary | ICD-10-CM | POA: Diagnosis not present

## 2015-01-09 DIAGNOSIS — M25519 Pain in unspecified shoulder: Secondary | ICD-10-CM | POA: Diagnosis not present

## 2015-01-09 DIAGNOSIS — N189 Chronic kidney disease, unspecified: Secondary | ICD-10-CM | POA: Diagnosis not present

## 2015-01-09 DIAGNOSIS — M1A09X0 Idiopathic chronic gout, multiple sites, without tophus (tophi): Secondary | ICD-10-CM | POA: Diagnosis not present

## 2015-02-10 ENCOUNTER — Ambulatory Visit (INDEPENDENT_AMBULATORY_CARE_PROVIDER_SITE_OTHER): Payer: Medicare Other | Admitting: Internal Medicine

## 2015-02-10 VITALS — BP 102/50 | HR 58 | Temp 95.5°F | Resp 14 | Ht 65.0 in | Wt 189.0 lb

## 2015-02-10 DIAGNOSIS — Z23 Encounter for immunization: Secondary | ICD-10-CM

## 2015-02-10 DIAGNOSIS — L03119 Cellulitis of unspecified part of limb: Secondary | ICD-10-CM

## 2015-02-10 DIAGNOSIS — S61452A Open bite of left hand, initial encounter: Secondary | ICD-10-CM

## 2015-02-10 DIAGNOSIS — W5501XA Bitten by cat, initial encounter: Secondary | ICD-10-CM

## 2015-02-10 MED ORDER — AMOXICILLIN-POT CLAVULANATE 875-125 MG PO TABS: 1.0000 | ORAL_TABLET | Freq: Two times a day (BID) | ORAL | Status: DC

## 2015-02-10 NOTE — Patient Instructions (Signed)
Td Vaccine (Tetanus and Diphtheria): What You Need to Know 1. Why get vaccinated? Tetanus  and diphtheria are very serious diseases. They are rare in the [place] today, but people who do become infected often have severe complications. Td vaccine is used to protect adolescents and adults from both of these diseases. Both tetanus and diphtheria are infections caused by bacteria. Diphtheria spreads from person to person through coughing or sneezing. Tetanus-causing bacteria enter the body through cuts, scratches, or wounds. TETANUS (Lockjaw) causes painful muscle tightening and stiffness, usually all over the body.  It can lead to tightening of muscles in the head and neck so you can't open your mouth, swallow, or sometimes even breathe. Tetanus kills about 1 out of every 5 people who are infected. DIPHTHERIA can cause a thick coating to form in the back of the throat.  It can lead to breathing problems, paralysis, heart failure, and death. Before vaccines, the [place] saw as many as 200,000 cases a year of diphtheria and hundreds of cases of tetanus. Since vaccination began, cases of both diseases have dropped by about 99%. 2. Td vaccine Td vaccine can protect adolescents and adults from tetanus and diphtheria. Td is usually given as a booster dose every 10 years but it can also be given earlier after a severe and dirty wound or burn. Your doctor can give you more information. Td may safely be given at the same time as other vaccines. 3. Some people should not get this vaccine  If you ever had a life-threatening allergic reaction after a dose of any tetanus or diphtheria containing vaccine, OR if you have a severe allergy to any part of this vaccine, you should not get Td. Tell your doctor if you have any severe allergies.  Talk to your doctor if you:  have epilepsy or another nervous system problem,  had severe pain or swelling after any vaccine containing diphtheria or  tetanus,  ever had Guillain Barr Syndrome (GBS),  aren't feeling well on the day the shot is scheduled. 4. Risks of a vaccine reaction With a vaccine, like any medicine, there is a chance of side effects. These are usually mild and go away on their own. Serious side effects are also possible, but are very rare. Most people who get Td vaccine do not have any problems with it. Mild Problems  following Td (Did not interfere with activities)  Pain where the shot was given (about 8 people in 10)  Redness or swelling where the shot was given (about 1 person in 3)  Mild fever (about 1 person in 15)  Headache or Tiredness (uncommon) Moderate Problems following Td (Interfered with activities, but did not require medical attention)  Fever over 102F (rare) Severe Problems  following Td (Unable to perform usual activities; required medical attention)  Swelling, severe pain, bleeding and/or redness in the arm where the shot was given (rare). Problems that could happen after any vaccine:  Brief fainting spells can happen after any medical procedure, including vaccination. Sitting or lying down for about 15 minutes can help prevent fainting, and injuries caused by a fall. Tell your doctor if you feel dizzy, or have vision changes or ringing in the ears.  Severe shoulder pain and reduced range of motion in the arm where a shot was given can happen, very rarely, after a vaccination.  Severe allergic reactions from a vaccine are very rare, estimated at less than 1 in a million doses. If one were to occur,   it would usually be within a few minutes to a few hours after the vaccination. 5. What if there is a serious reaction? What should I look for?  Look for anything that concerns you, such as signs of a severe allergic reaction, very high fever, or behavior changes. Signs of a severe allergic reaction can include hives, swelling of the face and throat, difficulty breathing, a fast heartbeat,  dizziness, and weakness. These would usually start a few minutes to a few hours after the vaccination. What should I do?  If you think it is a severe allergic reaction or other emergency that can't wait, call 9-1-1 or get the person to the nearest hospital. Otherwise, call your doctor.  Afterward, the reaction should be reported to the Vaccine Adverse Event Reporting System (VAERS). Your doctor might file this report, or you can do it yourself through the VAERS web site at www.vaers.hhs.gov, or by calling 1-800-822-7967. VAERS is only for reporting reactions. They do not give medical advice. 6. The National Vaccine Injury Compensation Program The National Vaccine Injury Compensation Program (VICP) is a federal program that was created to compensate people who may have been injured by certain vaccines. Persons who believe they may have been injured by a vaccine can learn about the program and about filing a claim by calling 1-800-338-2382 or visiting the VICP website at www.hrsa.gov/vaccinecompensation. 7. How can I learn more?  Ask your doctor.  Contact your local or state health department.  Contact the Centers for Disease Control and Prevention (CDC):  Call 1-800-232-4636 (1-800-CDC-INFO)  Visit CDC's website at www.cdc.gov/vaccines CDC Td Vaccine Interim VIS (11/09/12) Document Released: 07/20/2006 Document Revised: 02/06/2014 Document Reviewed: 01/04/2014 ExitCare Patient Information 2015 [employer]. This information is not intended to replace advice given to you by your health care provider. Make sure you discuss any questions you have with your health care provider.  

## 2015-02-10 NOTE — Progress Notes (Signed)
   Subjective:    Patient ID: Paul Mays, male    DOB: 06/29/1928, 81 y.o.   MRN: [id number]  Animal Bite   HPI Comments: Paul Mays is a 81 y.o. male who presents to the [facility] complaining of an animal bite onset one day ago. He states that he was attempting to put his cat in its kennel to take it to his vet when the cat bit his left arm. He states that he put ointment on the area and overnight, the hand became red, swollen and painful. He states that he has taken 2 Aleve to no relief. He states that he had his tdap recently--PCPBill Hopper--so td would be in chart and chart review reveals no recent tetanus..    No overnight chills or fever  No red streaking up the arm    Prior to Admission medications   Medication Sig Start Date End Date Taking? Authorizing Provider  allopurinol (ZYLOPRIM) 300 MG tablet Take 300 mg by mouth as needed.    Yes Historical Provider, MD  COLCRYS 0.6 MG tablet As directed 03/14/11  Yes Historical Provider, MD  gabapentin (NEURONTIN) 300 MG capsule Take 2 capsules (600 mg total) by mouth 2 (two) times daily. 12/25/14  Yes Hendricks Limes, MD  losartan (COZAAR) 50 MG tablet Take 1 tablet (50 mg total) by mouth daily. 08/14/14  Yes Hendricks Limes, MD  mesalamine (APRISO) 0.375 G 24 hr capsule Take 375 mg by mouth. Take 4 tabs by mouth each morning    Yes Historical Provider, MD  metoprolol (LOPRESSOR) 50 MG tablet 1/2 tab by mouth two times a day.    Yes Historical Provider, MD  pravastatin (PRAVACHOL) 40 MG tablet Take 40 mg by mouth daily.     Yes Historical Provider, MD    Review of Systems  Skin: Positive for wound.   no other things sides present illness    Objective:   Physical Exam  Constitutional: He is oriented to person, place, and time. He appears well-developed and well-nourished.  HENT:  Head: Normocephalic and atraumatic.  Eyes: EOM are normal.  Neck: Normal range of motion. Neck supple.  Cardiovascular: Normal  rate.   Pulmonary/Chest: Effort normal.  Musculoskeletal: Normal range of motion.  Neurological: He is alert and oriented to person, place, and time.  Skin: Skin is warm and dry.  There are 5 tooth marks on the dorsum of the left hand with mild swelling and redness, but no erythema involving the tendon sheaths and no pain with tendon ROM  Psychiatric: He has a normal mood and affect. His behavior is normal.  Nursing note and vitals reviewed.     Assessment & Plan:  I have completed the patient encounter in its entirety as documented by the scribe, with editing by me where necessary. Irelynn Schermerhorn P. Laney Pastor, M.D. Cat bite, initial encounter  Cellulitis of hand  Meds ordered this encounter  Medications  . amoxicillin-clavulanate (AUGMENTIN) 875-125 MG per tablet    Sig: Take 1 tablet by mouth 2 (two) times daily.    Dispense:  20 tablet    Refill:  0  Hot soaks 3 times a day Td updated Follow-up if red streaking or increased pain

## 2015-02-12 ENCOUNTER — Other Ambulatory Visit: Payer: Self-pay

## 2015-02-12 DIAGNOSIS — I10 Essential (primary) hypertension: Secondary | ICD-10-CM

## 2015-02-12 MED ORDER — LOSARTAN POTASSIUM 50 MG PO TABS
50.0000 mg | ORAL_TABLET | Freq: Every day | ORAL | Status: DC
Start: 2015-02-12 — End: 2015-08-09

## 2015-03-07 ENCOUNTER — Encounter: Payer: Self-pay | Admitting: Cardiovascular Disease

## 2015-03-07 ENCOUNTER — Ambulatory Visit (INDEPENDENT_AMBULATORY_CARE_PROVIDER_SITE_OTHER): Payer: Medicare Other | Admitting: Cardiovascular Disease

## 2015-03-07 VITALS — BP 160/68 | HR 56 | Ht 65.0 in | Wt 188.1 lb

## 2015-03-07 DIAGNOSIS — R0600 Dyspnea, unspecified: Secondary | ICD-10-CM

## 2015-03-07 DIAGNOSIS — I422 Other hypertrophic cardiomyopathy: Secondary | ICD-10-CM

## 2015-03-07 DIAGNOSIS — I2510 Atherosclerotic heart disease of native coronary artery without angina pectoris: Secondary | ICD-10-CM

## 2015-03-07 NOTE — Progress Notes (Signed)
Cardiology Office Note   Date:  03/08/2015   ID:  [name], DOB [birth year], MRN [id number]  PCP:  [name], MD  Cardiologist:  Sherren Mocha, MD    Chief Complaint  Patient presents with  . Shortness of Breath    History of Present Illness: Paul Mays is a 81 y.o. male who presents for followup of CAD with remote PCI. He also has asymmetric LVH with septal hypertrophy and outflow tract obstruction.  Overall he is doing ok. At times he wakes up and feels like he can't catch his breath. He takes a few deep breaths and feels better quickly. No chest pain or pressure. No orthopnea or edema. No other complaints today. He can walk without exertional shortness of breath but overall he's not very active.   Past Medical History  Diagnosis Date  . Arthralgia   . Colitis   . Edema   . Venous insufficiency   . Olecranon bursitis   . Peripheral neuropathy   . Murmur   . Carotid stenosis   . Aortic stenosis   . CAD (coronary artery disease)   . Hearing loss   . Anxiety   . Hyperuricemia   . Pulmonary hypertension   . Polyneuropathy   . Gout   . OSA (obstructive sleep apnea)   . Hypertension   . Hyperlipidemia   . Impacted cerumen   . Arthritis   . Migraine   . Unspecified hereditary and idiopathic peripheral neuropathy   . Migraine   . Other and unspecified hyperlipidemia   . Other malaise and fatigue   . Herpes zoster without mention of complication   . Cervicalgia    Past Surgical History  Procedure Laterality Date  . Angioplasty      w/ 2 stents in 1999  . Tonsillectomy    . Appendectomy    . Hemorrhoid surgery    . Lipoma excision     Current Outpatient Prescriptions  Medication Sig Dispense Refill  . allopurinol (ZYLOPRIM) 300 MG tablet Take 300 mg by mouth as needed.     . gabapentin (NEURONTIN) 600 MG tablet Take 600 mg by mouth 2 (two) times daily.    Marland Kitchen losartan (COZAAR) 50 MG tablet Take 1 tablet (50 mg total) by mouth daily. 30 tablet 5  .  mesalamine (APRISO) 0.375 G 24 hr capsule Take 375 mg by mouth. Take 4 tabs by mouth each morning     . metoprolol (LOPRESSOR) 50 MG tablet 1/2 tab by mouth two times a day.     . pravastatin (PRAVACHOL) 40 MG tablet Take 40 mg by mouth daily.      . predniSONE (DELTASONE) 5 MG tablet Take 15 mg by mouth daily.  0   No current facility-administered medications for this visit.   Allergies:   Lisinopril and Spironolactone   Social History:  The patient  reports that he has never smoked. He has never used smokeless tobacco. He reports that he drinks about 1.2 oz of alcohol per week. He reports that he does not use illicit drugs.   Family History:  The patient's  family history includes Diabetes in his brother, father, and sister; Healthy in his daughter and son; High blood pressure in his father; Other in his mother; Stroke in his father.   ROS:  Please see the history of present illness.  Otherwise, review of systems is positive for muscle aches, easy bruising.  All other systems are reviewed and negative.  PHYSICAL EXAM: VS:  BP 160/68 mmHg  Pulse 56  Ht 5\' 5"  (1.651 m)  Wt 188 lb 2 oz (85.333 kg)  BMI 31.31 kg/m2 , BMI Body mass index is 31.31 kg/(m^2). GEN: Well nourished, well developed, pleasant elderly male in no acute distress HEENT: normal Neck: no JVD, no masses. No carotid bruits Cardiac: RRR with 2/6 systolic murmur at the LSB               Respiratory:  clear to auscultation bilaterally, normal work of breathing GI: soft, nontender, nondistended, + BS MS: no deformity or atrophy Ext: no pretibial edema, pedal pulses 2+= bilaterally Skin: warm and dry, no rash Neuro:  Strength and sensation are intact Psych: euthymic mood, full affect  EKG:  EKG is ordered today. The ekg ordered today shows sinus bradycardia with first degree AV block, 56 bpm, nonspecific T wave changes  Recent Labs: 06/30/2014: ALT 19; Hemoglobin 13.7; Platelets 117.0*   Lipid Panel     Component  Value Date/Time   CHOL 169 12/12/2010 0957   TRIG 33.0 12/12/2010 0957   HDL 96.80 12/12/2010 0957   CHOLHDL 2 12/12/2010 0957   VLDL 6.6 12/12/2010 0957   LDLCALC 66 12/12/2010 0957    Wt Readings from Last 3 Encounters:  03/07/15 188 lb 2 oz (85.333 kg)  02/10/15 189 lb (85.73 kg)  09/04/14 186 lb 9.6 oz (84.641 kg)    Cardiac Studies Reviewed: 2D Echo 11.25.2014: Study Conclusions  - Left ventricle: The cavity size was normal. Moderate asymmetric basal septal hypertrophy. Systolic function was normal. The estimated ejection fraction was in the range of 60% to 65%. LV outflow tract peak gradient 22 mmHg. Wall motion was normal; there were no regional wall motion abnormalities. Features are consistent with a pseudonormal left ventricular filling pattern, with concomitant abnormal relaxation and increased filling pressure (grade 2 diastolic dysfunction). E/medial e' > 15 suggests LV end diastolic pressure at least 20 mmHg. - Aortic valve: Trileaflet; moderately calcified leaflets. Sclerosis without stenosis. Mild regurgitation. - Mitral valve: There was mild systolic anterior motion of the mitral valve. Mild regurgitation. - Left atrium: The atrium was moderately dilated. - Right ventricle: The cavity size was normal. Systolic function was normal. - Tricuspid valve: Peak RV-RA gradient: 73mm Hg (S). - Pulmonary arteries: PA peak pressure: 2mm Hg (S). - Inferior vena cava: The vessel was normal in size; the respirophasic diameter changes were in the normal range (= 50%); findings are consistent with normal central venous pressure. Impressions:  - Normal LV size with moderate asymmetric basal septal hypertrophy. EF 60-65%. Mild LV outflow tract gradient. Aortic sclerosis without stenosis. Mild AI. There was mild mitral valve systolic anterior motion with mild mitral regurgitation. Moderate LV diastolic dysfunction with evidence  for elevated LV filling pressure. Moderate pulmonary hypertension. Normal RV size and systolic function. Study is consistent with HOCM.  ASSESSMENT AND PLAN: 1.  Hypertrophic cardiomyopathy: overall appears stable. Likely senile form of HCM primarily with basal septal hypertrophy. Will repeat 2D echo. Continue same medical therapy.  2. CAD, native vessel: appears stable without angina after remote coronary stenting.   3. HTN: continue losartan and metoprolol. Low sodium diet.  4. Hyperlipidemia: lengthy discussion about pros and cons of statin drug. Specific issues discussed including his advanced age, known CAD, and reasonably good tolerance of this. He will continue.   Current medicines are reviewed with the patient today.  The patient does not have concerns regarding medicines.  Labs/ tests ordered today  include:   Orders Placed This Encounter  Procedures  . EKG 12-Lead  . Echocardiogram    Disposition:   FU one year  Signed, Sherren Mocha, MD  03/08/2015 4:25 PM    [facility] [place], [place], [place]  [address] Phone: [phone]; Fax: [phone]

## 2015-03-07 NOTE — Patient Instructions (Signed)
Medication Instructions: - no changes  Labwork: - none  Procedures/Testing: - Your physician has requested that you have an echocardiogram. Echocardiography is a painless test that uses sound waves to create images of your heart. It provides your doctor with information about the size and shape of your heart and how well your heart's chambers and valves are working. This procedure takes approximately one hour. There are no restrictions for this procedure.  Follow-Up: - Your physician wants you to follow-up in: 1 year with Dr. Burt Knack. You will receive a reminder letter in the mail two months in advance. If you don't receive a letter, please call our office to schedule the follow-up appointment.  Any Additional Special Instructions Will Be Listed Below (If Applicable).

## 2015-03-09 DIAGNOSIS — C61 Malignant neoplasm of prostate: Secondary | ICD-10-CM | POA: Diagnosis not present

## 2015-03-12 ENCOUNTER — Encounter: Payer: Self-pay | Admitting: Internal Medicine

## 2015-03-15 ENCOUNTER — Other Ambulatory Visit: Payer: Self-pay

## 2015-03-15 ENCOUNTER — Ambulatory Visit (HOSPITAL_COMMUNITY): Payer: Medicare Other | Attending: Cardiovascular Disease

## 2015-03-15 DIAGNOSIS — I351 Nonrheumatic aortic (valve) insufficiency: Secondary | ICD-10-CM | POA: Insufficient documentation

## 2015-03-15 DIAGNOSIS — R0600 Dyspnea, unspecified: Secondary | ICD-10-CM | POA: Diagnosis not present

## 2015-03-15 DIAGNOSIS — I422 Other hypertrophic cardiomyopathy: Secondary | ICD-10-CM | POA: Diagnosis not present

## 2015-03-15 DIAGNOSIS — I059 Rheumatic mitral valve disease, unspecified: Secondary | ICD-10-CM | POA: Diagnosis not present

## 2015-04-02 ENCOUNTER — Other Ambulatory Visit: Payer: Self-pay

## 2015-04-18 DIAGNOSIS — L570 Actinic keratosis: Secondary | ICD-10-CM | POA: Diagnosis not present

## 2015-04-18 DIAGNOSIS — L218 Other seborrheic dermatitis: Secondary | ICD-10-CM | POA: Diagnosis not present

## 2015-04-18 DIAGNOSIS — Z85828 Personal history of other malignant neoplasm of skin: Secondary | ICD-10-CM | POA: Diagnosis not present

## 2015-04-25 DIAGNOSIS — M7042 Prepatellar bursitis, left knee: Secondary | ICD-10-CM | POA: Diagnosis not present

## 2015-04-25 DIAGNOSIS — M25562 Pain in left knee: Secondary | ICD-10-CM | POA: Diagnosis not present

## 2015-04-25 DIAGNOSIS — M1712 Unilateral primary osteoarthritis, left knee: Secondary | ICD-10-CM | POA: Diagnosis not present

## 2015-04-25 DIAGNOSIS — M25462 Effusion, left knee: Secondary | ICD-10-CM | POA: Diagnosis not present

## 2015-04-26 DIAGNOSIS — M1712 Unilateral primary osteoarthritis, left knee: Secondary | ICD-10-CM | POA: Diagnosis not present

## 2015-05-07 DIAGNOSIS — M7042 Prepatellar bursitis, left knee: Secondary | ICD-10-CM | POA: Diagnosis not present

## 2015-06-07 DIAGNOSIS — Z23 Encounter for immunization: Secondary | ICD-10-CM | POA: Diagnosis not present

## 2015-08-06 DIAGNOSIS — N183 Chronic kidney disease, stage 3 (moderate): Secondary | ICD-10-CM | POA: Diagnosis not present

## 2015-08-06 DIAGNOSIS — M25512 Pain in left shoulder: Secondary | ICD-10-CM | POA: Diagnosis not present

## 2015-08-06 DIAGNOSIS — M1A09X0 Idiopathic chronic gout, multiple sites, without tophus (tophi): Secondary | ICD-10-CM | POA: Diagnosis not present

## 2015-08-06 DIAGNOSIS — M25511 Pain in right shoulder: Secondary | ICD-10-CM | POA: Diagnosis not present

## 2015-08-06 DIAGNOSIS — M150 Primary generalized (osteo)arthritis: Secondary | ICD-10-CM | POA: Diagnosis not present

## 2015-08-09 ENCOUNTER — Other Ambulatory Visit: Payer: Self-pay | Admitting: Emergency Medicine

## 2015-08-09 DIAGNOSIS — I10 Essential (primary) hypertension: Secondary | ICD-10-CM

## 2015-08-09 MED ORDER — LOSARTAN POTASSIUM 50 MG PO TABS: 50.0000 mg | ORAL_TABLET | Freq: Every day | ORAL | Status: DC

## 2015-09-04 ENCOUNTER — Telehealth: Payer: Self-pay | Admitting: Internal Medicine

## 2015-09-04 DIAGNOSIS — I10 Essential (primary) hypertension: Secondary | ICD-10-CM

## 2015-09-04 MED ORDER — LOSARTAN POTASSIUM 50 MG PO TABS: 50.0000 mg | ORAL_TABLET | Freq: Every day | ORAL | Status: DC

## 2015-09-04 NOTE — Telephone Encounter (Signed)
Pt called in and wanted to know if he could get a refill on his losartan til dec 12th, he is transferring to Dr burns on the 12th

## 2015-09-18 ENCOUNTER — Ambulatory Visit (INDEPENDENT_AMBULATORY_CARE_PROVIDER_SITE_OTHER): Payer: Medicare Other | Admitting: Internal Medicine

## 2015-09-18 ENCOUNTER — Encounter: Payer: Self-pay | Admitting: Internal Medicine

## 2015-09-18 VITALS — BP 148/70 | HR 53 | Temp 97.7°F | Resp 16 | Wt 189.0 lb

## 2015-09-18 DIAGNOSIS — I10 Essential (primary) hypertension: Secondary | ICD-10-CM | POA: Diagnosis not present

## 2015-09-18 DIAGNOSIS — E785 Hyperlipidemia, unspecified: Secondary | ICD-10-CM

## 2015-09-18 DIAGNOSIS — G609 Hereditary and idiopathic neuropathy, unspecified: Secondary | ICD-10-CM

## 2015-09-18 DIAGNOSIS — I2510 Atherosclerotic heart disease of native coronary artery without angina pectoris: Secondary | ICD-10-CM | POA: Diagnosis not present

## 2015-09-18 DIAGNOSIS — E790 Hyperuricemia without signs of inflammatory arthritis and tophaceous disease: Secondary | ICD-10-CM

## 2015-09-18 MED ORDER — FLAVOCOXID 500 MG PO CAPS: ORAL_CAPSULE | ORAL | Status: DC

## 2015-09-18 MED ORDER — VITAMIN D3 50 MCG (2000 UT) PO CAPS: 2000.0000 [IU] | ORAL_CAPSULE | Freq: Every day | ORAL | Status: DC

## 2015-09-18 NOTE — Assessment & Plan Note (Addendum)
Sees neuro Taking gabapentin 600 mg twice daily Symptoms controlled

## 2015-09-18 NOTE — Patient Instructions (Addendum)
  We have reviewed your prior records including labs and tests today.  Test(s) ordered today. Your results will be released to [place] (or called to you) after review, usually within 72hours after test completion. If any changes need to be made, you will be notified at that same time.  All other Health Maintenance issues reviewed.   All recommended immunizations and age-appropriate screenings are up-to-date.  No immunizations administered today.   Medications reviewed and updated.  No changes recommended at this time.  Follow up at least once a year.

## 2015-09-18 NOTE — Telephone Encounter (Signed)
Referral from Dr. Quay Burow for Paul Mays; Call placed to the patient and stated he would be happy to come in, but will need to call back when wife is at home to schedule, as they have one car.  Will fup tomorrow afternoon at the patient's request.

## 2015-09-18 NOTE — Progress Notes (Signed)
Subjective:    Patient ID: Paul Mays, male    DOB: 06-11-1928, 81 y.o.   MRN: [id number]  HPI He is here to establish with a new pcp.    He has lost a lot of strength in his upper arms and shoulders.  He does have some arthritis.  He is following with a chiropractor and had xrays and blood work done at the [place]. He has some difficulty combing his hair.  He denies weakness in his legs.  He denies weakness in his hands.  He did have injections, but they did not help. He has not seen ortho or done PT.    Gout:  No flares in a couple of years.  He takes the allopurinol daily.  He takes cholchicine as needed.   Hypertension: He is taking his medication daily. He is compliant with a low sodium diet.  He denies chest pain, shortness of breath and regular headaches. He has occasional palpitations and edema.  He is exercising regularly - walks dog .  He does not monitor his blood pressure at home.    Hyperlipidemia: He is taking his medication daily. He is somewhat compliant with a low fat/cholesterol diet. He is walking his dog for exercise. He denies myalgias.    Medications and allergies reviewed with patient and updated if appropriate.  Patient Active Problem List   Diagnosis Date Noted  . Diverticulosis of colon without hemorrhage 07/02/2014  . Hypertrophic cardiomyopathy ([place]) 03/24/2011  . OBSTRUCTIVE SLEEP APNEA 11/08/2010  . HYPERTENSION, PULMONARY 09/16/2010  . HYPERURICEMIA 11/28/2009  . ANXIETY 11/09/2009  . HEARING LOSS, BILATERAL 06/18/2009  . CAD 03/07/2009  . AORTIC STENOSIS 03/07/2009  . CAROTID STENOSIS 03/07/2009  . HYPERLIPIDEMIA 11/15/2008  . PERIPHERAL NEUROPATHY 11/15/2008  . HYPERTENSION 11/15/2008  . ARTHRALGIA 11/15/2008  . COLITIS, HX OF 11/15/2008  . OLECRANON BURSITIS, RIGHT 09/14/2007  . VENOUS INSUFFICIENCY 04/20/2007  . EDEMA 04/20/2007  . GOUT 02/10/2007  . CLUSTER HEADACHE 02/10/2007  . POLYNEUROPATHY 02/10/2007  . MURMUR 02/10/2007  . PSTPRC  STATUS, PTCA 02/10/2007    Current Outpatient Prescriptions on File Prior to Visit  Medication Sig Dispense Refill  . allopurinol (ZYLOPRIM) 300 MG tablet Take 300 mg by mouth as needed.     . gabapentin (NEURONTIN) 600 MG tablet Take 600 mg by mouth 2 (two) times daily.    Marland Kitchen losartan (COZAAR) 50 MG tablet Take 1 tablet (50 mg total) by mouth daily. --- Pt needs office visit for further refills. 30 tablet 0  . mesalamine (APRISO) 0.375 G 24 hr capsule Take 375 mg by mouth. Take 4 tabs by mouth each morning     . metoprolol (LOPRESSOR) 50 MG tablet 1/2 tab by mouth two times a day.     . pravastatin (PRAVACHOL) 40 MG tablet Take 40 mg by mouth daily.       No current facility-administered medications on file prior to visit.    Past Medical History  Diagnosis Date  . Arthralgia   . Colitis   . Edema   . Venous insufficiency   . Olecranon bursitis   . Peripheral neuropathy   . Murmur   . Carotid stenosis   . Aortic stenosis   . CAD (coronary artery disease)   . Hearing loss   . Anxiety   . Hyperuricemia   . Pulmonary hypertension   . Polyneuropathy   . Gout   . OSA (obstructive sleep apnea)   . Hypertension   .  Hyperlipidemia   . Impacted cerumen   . Arthritis   . Migraine   . Unspecified hereditary and idiopathic peripheral neuropathy   . Migraine   . Other and unspecified hyperlipidemia   . Other malaise and fatigue   . Herpes zoster without mention of complication   . Cervicalgia     Past Surgical History  Procedure Laterality Date  . Angioplasty      w/ 2 stents in 1999  . Tonsillectomy    . Appendectomy    . Hemorrhoid surgery    . Lipoma excision      Social History   Social History  . Marital Status: Married    Spouse Name: [name]  . Number of Children: 2  . Years of Education: college   Occupational History  . retired     worked as a Engineer, water  .     Social History Main Topics  . Smoking status: Never Smoker   . Smokeless  tobacco: Never Used  . Alcohol Use: 1.2 oz/week    2 Shots of liquor per week     Comment: Before Dinner (Smirnoff Vodka with lemonade) 3 glases   . Drug Use: No  . Sexual Activity: Not on file   Other Topics Concern  . Not on file   Social History Narrative   Pt is married and lives with wife (suzanne). Patient finished  two years of college.    Pt has children.   Caffeine -two cups daily.   Left handed.   Retired Licensed conveyancer                     Review of Systems  Constitutional: Negative for fever, chills and fatigue.  Respiratory: Negative for cough, shortness of breath and wheezing.   Cardiovascular: Positive for palpitations (occasional) and leg swelling (occasional). Negative for chest pain.  Musculoskeletal: Negative for back pain and arthralgias.  Neurological: Positive for light-headedness (from gabapentin). Negative for numbness and headaches.  Psychiatric/Behavioral: Negative for dysphoric mood. The patient is not nervous/anxious.        Objective:   Filed Vitals:   09/18/15 1400  BP: 148/70  Pulse: 53  Temp: 97.7 F (36.5 C)  Resp: 16   Filed Weights   09/18/15 1400  Weight: 189 lb (85.73 kg)   Body mass index is 31.45 kg/(m^2).   Physical Exam Constitutional: Appears well-developed and well-nourished. No distress.  Neck: Neck supple. No tracheal deviation present. No thyromegaly present.  No carotid bruit. No cervical adenopathy.   Cardiovascular: Normal rate, regular rhythm and normal heart sounds.   2/6 systolic murmur Pulmonary/Chest: Effort normal and breath sounds normal. No respiratory distress. No wheezes.  Musculoskeletal: No edema.        Assessment & Plan:   Will get records regarding shoulder pain/weakness May benefit from PT or seeing orthopedics  Blood work ordered  Follow up at least once a year

## 2015-09-18 NOTE — Assessment & Plan Note (Signed)
Taking allopurinol daily, colchicine as needed for gout Will check uric acid level

## 2015-09-18 NOTE — Progress Notes (Signed)
Pre visit review using our clinic review tool, if applicable. No additional management support is needed unless otherwise documented below in the visit note. 

## 2015-09-18 NOTE — Assessment & Plan Note (Signed)
Check lipid panel, cmp Taking pravastatin 40 mg daily

## 2015-09-18 NOTE — Assessment & Plan Note (Signed)
BP Readings from Last 3 Encounters:  09/18/15 148/70  03/07/15 160/68  02/10/15 102/50   BP slightly elevated today Encouraged weight loss - decrease portions Monitor bp at home Low sodium diet Continue current medications Check blood work

## 2015-09-19 ENCOUNTER — Telehealth: Payer: Self-pay

## 2015-09-19 NOTE — Telephone Encounter (Signed)
fup regarding AWV; stated his car was in the garage and asked to call him back tomorrow at around 4pm and he will schedule in the afternoon Friday or next week, depending on when he gets his car.

## 2015-09-24 ENCOUNTER — Encounter: Payer: Self-pay | Admitting: Internal Medicine

## 2015-09-24 ENCOUNTER — Other Ambulatory Visit (INDEPENDENT_AMBULATORY_CARE_PROVIDER_SITE_OTHER): Payer: Medicare Other

## 2015-09-24 DIAGNOSIS — G609 Hereditary and idiopathic neuropathy, unspecified: Secondary | ICD-10-CM | POA: Diagnosis not present

## 2015-09-24 DIAGNOSIS — D696 Thrombocytopenia, unspecified: Secondary | ICD-10-CM | POA: Insufficient documentation

## 2015-09-24 DIAGNOSIS — E790 Hyperuricemia without signs of inflammatory arthritis and tophaceous disease: Secondary | ICD-10-CM

## 2015-09-24 DIAGNOSIS — E785 Hyperlipidemia, unspecified: Secondary | ICD-10-CM

## 2015-09-24 DIAGNOSIS — I10 Essential (primary) hypertension: Secondary | ICD-10-CM | POA: Diagnosis not present

## 2015-09-24 LAB — LIPID PANEL
CHOL/HDL RATIO: 2
Cholesterol: 157 mg/dL (ref 0–200)
HDL: 68.9 mg/dL (ref >39.00)
LDL CALC: 76 mg/dL (ref 0–99)
NONHDL: 88.09
Triglycerides: 58 mg/dL (ref 0.0–149.0)
VLDL: 11.6 mg/dL (ref 0.0–40.0)

## 2015-09-24 LAB — CBC WITH DIFFERENTIAL/PLATELET
BASOS PCT: 0.5 % (ref 0.0–3.0)
Basophils Absolute: 0 10*3/uL (ref 0.0–0.1)
EOS PCT: 2.3 % (ref 0.0–5.0)
Eosinophils Absolute: 0.1 10*3/uL (ref 0.0–0.7)
HEMATOCRIT: 40.6 % (ref 39.0–52.0)
HEMOGLOBIN: 13.7 g/dL (ref 13.0–17.0)
LYMPHS PCT: 23.1 % (ref 12.0–46.0)
Lymphs Abs: 1 10*3/uL (ref 0.7–4.0)
MCHC: 33.7 g/dL (ref 30.0–36.0)
MCV: 98.6 fl (ref 78.0–100.0)
MONO ABS: 0.4 10*3/uL (ref 0.1–1.0)
Monocytes Relative: 10.5 % (ref 3.0–12.0)
Neutro Abs: 2.7 10*3/uL (ref 1.4–7.7)
Neutrophils Relative %: 63.6 % (ref 43.0–77.0)
Platelets: 113 10*3/uL — ABNORMAL LOW (ref 150.0–400.0)
RBC: 4.12 Mil/uL — AB (ref 4.22–5.81)
RDW: 14.1 % (ref 11.5–15.5)
WBC: 4.2 10*3/uL (ref 4.0–10.5)

## 2015-09-24 LAB — COMPREHENSIVE METABOLIC PANEL
ALBUMIN: 4 g/dL (ref 3.5–5.2)
ALK PHOS: 30 U/L — AB (ref 39–117)
ALT: 18 U/L (ref 0–53)
AST: 20 U/L (ref 0–37)
BUN: 18 mg/dL (ref 6–23)
CALCIUM: 9.1 mg/dL (ref 8.4–10.5)
CO2: 28 mEq/L (ref 19–32)
CREATININE: 1.15 mg/dL (ref 0.40–1.50)
Chloride: 108 mEq/L (ref 96–112)
GFR: 64.11 mL/min (ref >60.00)
Glucose, Bld: 105 mg/dL — ABNORMAL HIGH (ref 70–99)
POTASSIUM: 4.7 meq/L (ref 3.5–5.1)
SODIUM: 142 meq/L (ref 135–145)
TOTAL PROTEIN: 6.2 g/dL (ref 6.0–8.3)
Total Bilirubin: 1 mg/dL (ref 0.2–1.2)

## 2015-09-24 LAB — TSH: TSH: 1.97 u[IU]/mL (ref 0.35–4.50)

## 2015-09-24 LAB — URIC ACID: URIC ACID, SERUM: 3.3 mg/dL — AB (ref 4.0–7.8)

## 2015-09-24 LAB — HEMOGLOBIN A1C: Hgb A1c MFr Bld: 5.1 % (ref 4.6–6.5)

## 2015-09-25 ENCOUNTER — Telehealth: Payer: Self-pay | Admitting: *Deleted

## 2015-09-25 MED ORDER — GABAPENTIN 600 MG PO TABS: 600.0000 mg | ORAL_TABLET | Freq: Two times a day (BID) | ORAL | Status: DC

## 2015-09-25 NOTE — Telephone Encounter (Signed)
Received call pt states he has 2 days left on his Gabapentin he normally get from [place], but they stated they wouldn't get med in until 31st. Wanting to see if md can rx enough until he receive his from [place]. Inform pt will send a 30 day supply to [facility]...Johny Chess

## 2015-10-04 ENCOUNTER — Encounter: Payer: Self-pay | Admitting: *Deleted

## 2015-10-09 ENCOUNTER — Encounter: Payer: Self-pay | Admitting: Internal Medicine

## 2015-10-09 ENCOUNTER — Ambulatory Visit (INDEPENDENT_AMBULATORY_CARE_PROVIDER_SITE_OTHER): Payer: Medicare Other | Admitting: Internal Medicine

## 2015-10-09 VITALS — BP 132/60 | HR 52 | Temp 97.6°F | Resp 18 | Wt 189.0 lb

## 2015-10-09 DIAGNOSIS — D696 Thrombocytopenia, unspecified: Secondary | ICD-10-CM | POA: Diagnosis not present

## 2015-10-09 DIAGNOSIS — M25511 Pain in right shoulder: Secondary | ICD-10-CM

## 2015-10-09 DIAGNOSIS — M25512 Pain in left shoulder: Secondary | ICD-10-CM

## 2015-10-09 DIAGNOSIS — I10 Essential (primary) hypertension: Secondary | ICD-10-CM | POA: Diagnosis not present

## 2015-10-09 MED ORDER — LOSARTAN POTASSIUM 50 MG PO TABS: 50.0000 mg | ORAL_TABLET | Freq: Every day | ORAL | Status: DC

## 2015-10-09 NOTE — Progress Notes (Signed)
Pre visit review using our clinic review tool, if applicable. No additional management support is needed unless otherwise documented below in the visit note. 

## 2015-10-09 NOTE — Patient Instructions (Addendum)
We will schedule you with Dr Tamala Julian to evaluate your shoulder pain and decreased range of motion.  An Ultrasound of your abdomen was ordered to rule out splenomegaly (large spleen) since this can lower your platelet count.  For now we will just monitor your platelets.    Follow up in 6 months.

## 2015-10-09 NOTE — Progress Notes (Signed)
Subjective:    Patient ID: Paul Mays, male    DOB: [birth year], 82 y.o.   MRN: [id number]  HPI He is here for an acute visit for bilateral shoulder pain and decreased range of motion.  Bilateral shoulder joint pain:  He has difficulty combing his hair.  He hears popping, cracking in the shoulders. He has decrased ROM of both shoulder.  He had xrays down in august at the [place] and they showed degenerative changes and osteophytic spurring.  He does not want sugery, but wants a medication taht works.  He has seen Dr. Amil Amen and had injections in the shoulder and it did not help.  He also prescribed him Limbrel and it did not help.    Thrombocytopenia with: His platelet count has been chronically low for a while. He does experience some easy bruising and bleeding at times. He has never had further workup.  Medications and allergies reviewed with patient and updated if appropriate.  Patient Active Problem List   Diagnosis Date Noted  . Thrombocytopenia ([place]) 09/24/2015  . Diverticulosis of colon without hemorrhage 07/02/2014  . Hypertrophic cardiomyopathy ([place]) 03/24/2011  . OBSTRUCTIVE SLEEP APNEA 11/08/2010  . HYPERTENSION, PULMONARY 09/16/2010  . Hyperuricemia 11/28/2009  . ANXIETY 11/09/2009  . HEARING LOSS, BILATERAL 06/18/2009  . CAD 03/07/2009  . AORTIC STENOSIS 03/07/2009  . CAROTID STENOSIS 03/07/2009  . Hyperlipidemia 11/15/2008  . Peripheral neuropathy, idiopathic 11/15/2008  . Essential hypertension 11/15/2008  . ARTHRALGIA 11/15/2008  . COLITIS, HX OF 11/15/2008  . OLECRANON BURSITIS, RIGHT 09/14/2007  . VENOUS INSUFFICIENCY 04/20/2007  . EDEMA 04/20/2007  . GOUT 02/10/2007  . POLYNEUROPATHY 02/10/2007  . MURMUR 02/10/2007  . PSTPRC STATUS, PTCA 02/10/2007    Current Outpatient Prescriptions on File Prior to Visit  Medication Sig Dispense Refill  . allopurinol (ZYLOPRIM) 300 MG tablet Take 300 mg by mouth as needed.     . Cholecalciferol (VITAMIN D3) 2000 UNITS  capsule Take 1 capsule (2,000 Units total) by mouth daily.    . Flavocoxid (LIMBREL) 500 MG CAPS Takes daily  0  . gabapentin (NEURONTIN) 600 MG tablet Take 1 tablet (600 mg total) by mouth 2 (two) times daily. 60 tablet 0  . mesalamine (APRISO) 0.375 G 24 hr capsule Take 375 mg by mouth. Take 4 tabs by mouth each morning     . metoprolol (LOPRESSOR) 50 MG tablet 1/2 tab by mouth two times a day.     . pravastatin (PRAVACHOL) 40 MG tablet Take 40 mg by mouth daily.       No current facility-administered medications on file prior to visit.    Past Medical History  Diagnosis Date  . Arthralgia   . Colitis   . Edema   . Venous insufficiency   . Olecranon bursitis   . Peripheral neuropathy ([place])   . Murmur   . Carotid stenosis   . Aortic stenosis   . CAD (coronary artery disease)   . Hearing loss   . Anxiety   . Hyperuricemia   . Pulmonary hypertension ([place])   . Polyneuropathy ([place])   . Gout   . OSA (obstructive sleep apnea)   . Hypertension   . Hyperlipidemia   . Impacted cerumen   . Arthritis   . Migraine   . Unspecified hereditary and idiopathic peripheral neuropathy   . Migraine   . Other and unspecified hyperlipidemia   . Other malaise and fatigue   . Herpes zoster without mention of complication   .  Cervicalgia   . CLUSTER HEADACHE 02/10/2007    Qualifier: Diagnosis of  By: Linna Darner MD, Gwyndolyn Saxon      Past Surgical History  Procedure Laterality Date  . Angioplasty      w/ 2 stents in 1999  . Tonsillectomy    . Appendectomy    . Hemorrhoid surgery    . Lipoma excision      Social History   Social History  . Marital Status: Married    Spouse Name: [name]  . Number of Children: 2  . Years of Education: college   Occupational History  . retired     worked as a Engineer, water  .     Social History Main Topics  . Smoking status: Never Smoker   . Smokeless tobacco: Never Used  . Alcohol Use: 1.2 oz/week    2 Shots of liquor per week     Comment:  Before Dinner (Smirnoff Vodka with lemonade) 3 glases   . Drug Use: No  . Sexual Activity: Not on file   Other Topics Concern  . Not on file   Social History Narrative   Pt is married and lives with wife (suzanne). Patient finished  two years of college.    Pt has children.   Caffeine -two cups daily.   Left handed.   Retired Licensed conveyancer                     Review of Systems  Constitutional: Negative for fever, chills, appetite change and unexpected weight change.  Gastrointestinal: Negative for nausea, abdominal pain, diarrhea and constipation.  Musculoskeletal: Negative for myalgias and joint swelling.       No pain radiating down the arms, no neck pain  Neurological: Negative for weakness and numbness.  Hematological: Bruises/bleeds easily.       Objective:   Filed Vitals:   10/09/15 1445  BP: 132/60  Pulse: 52  Temp: 97.6 F (36.4 C)  Resp: 18   Filed Weights   10/09/15 1445  Weight: 189 lb (85.73 kg)   Body mass index is 31.45 kg/(m^2).   Physical Exam A bilateral shoulder exam was performed.   SWELLING: none  EFFUSION: no  WARMTH: no warmth  TENDERNESS: no tenderness on throughout shoulder joint, mild lateral shoulder pain with palpation  ROM: Decreased ROM with pain, there is an occasional pop with certain movements in right shoulder NEUROLOGICAL EXAM: normal sensation and strength  PULSES: normal       Assessment & Plan:   Bilateral shoulder pain He does have significant decreased range of motion on exam X-rays reviewed We'll refer to Dr. Tamala Julian for further management He has had injections in the past without improvement and he would like to avoid surgery  Thrombocytopenia Discussed some potential causes He deferred referral to a hematologist at this time and would prefer for [name] just to monitor We'll order an abdominal ultrasound to evaluate for liver and spleen disease

## 2015-10-18 ENCOUNTER — Encounter: Payer: Self-pay | Admitting: Family Medicine

## 2015-10-18 ENCOUNTER — Other Ambulatory Visit (INDEPENDENT_AMBULATORY_CARE_PROVIDER_SITE_OTHER): Payer: Medicare Other

## 2015-10-18 ENCOUNTER — Ambulatory Visit (INDEPENDENT_AMBULATORY_CARE_PROVIDER_SITE_OTHER): Payer: Medicare Other | Admitting: Family Medicine

## 2015-10-18 VITALS — BP 130/78 | HR 55 | Ht 65.0 in | Wt 190.0 lb

## 2015-10-18 DIAGNOSIS — M25512 Pain in left shoulder: Secondary | ICD-10-CM

## 2015-10-18 DIAGNOSIS — M12812 Other specific arthropathies, not elsewhere classified, left shoulder: Secondary | ICD-10-CM | POA: Diagnosis not present

## 2015-10-18 DIAGNOSIS — M25511 Pain in right shoulder: Secondary | ICD-10-CM

## 2015-10-18 DIAGNOSIS — M75102 Unspecified rotator cuff tear or rupture of left shoulder, not specified as traumatic: Secondary | ICD-10-CM

## 2015-10-18 DIAGNOSIS — M12811 Other specific arthropathies, not elsewhere classified, right shoulder: Secondary | ICD-10-CM

## 2015-10-18 DIAGNOSIS — M75101 Unspecified rotator cuff tear or rupture of right shoulder, not specified as traumatic: Secondary | ICD-10-CM | POA: Insufficient documentation

## 2015-10-18 NOTE — Progress Notes (Signed)
Pre visit review using our clinic review tool, if applicable. No additional management support is needed unless otherwise documented below in the visit note. 

## 2015-10-18 NOTE — Progress Notes (Signed)
[name] [address], [place] [address] Phone: [phone] Subjective:    I'm seeing this patient by the request  of:  Binnie Rail, MD  CC: Bilateral shoulder pain  [id number] Paul Mays is a 82 y.o. male coming in with complaint of bilateral shoulder pain. Patient has had this pain for multiple years. Seems to be worsening over the course of time. Seems to be very localized to the shoulder themselves readily hurt with activity. Patient states that he has limited range of motion and is unable to even pick up his arms to comb his hair. Making some daily activities such as dressing more difficult. Can sometimes wake up at night. States that there is no pain at rest the patient does have 7 out of 10 pain with movement. Significant loud audible popping he states that his hurtful bilaterally right greater than left.    Patient has had x-rays of his neck were reviewed by me. Back in 2002 patient did have significant degenerative disc disease at multiple levels with some mild to moderate spinal stenosis. CT of patient's cervical spine in 2010 showed degenerative changes with mild anterior listhesis of C4 on C5  Patient also brought in images that were independently visualized by me today of his shoulders from the [place]. Shoulder surgery show the patient has severe bone-on-bone osteophytic changes of the shoulders with high riding humeral head. This is consistent with a chronic rotator cuff arthropathy Past Medical History  Diagnosis Date  . Arthralgia   . Colitis   . Edema   . Venous insufficiency   . Olecranon bursitis   . Peripheral neuropathy ([place])   . Murmur   . Carotid stenosis   . Aortic stenosis   . CAD (coronary artery disease)   . Hearing loss   . Anxiety   . Hyperuricemia   . Pulmonary hypertension ([place])   . Polyneuropathy ([place])   . Gout   . OSA (obstructive sleep apnea)   . Hypertension   . Hyperlipidemia   . Impacted cerumen     . Arthritis   . Migraine   . Unspecified hereditary and idiopathic peripheral neuropathy   . Migraine   . Other and unspecified hyperlipidemia   . Other malaise and fatigue   . Herpes zoster without mention of complication   . Cervicalgia   . CLUSTER HEADACHE 02/10/2007    Qualifier: Diagnosis of  By: Linna Darner MD, Gwyndolyn Saxon     Past Surgical History  Procedure Laterality Date  . Angioplasty      w/ 2 stents in 1999  . Tonsillectomy    . Appendectomy    . Hemorrhoid surgery    . Lipoma excision     Social History   Social History  . Marital Status: Married    Spouse Name: [name]  . Number of Children: 2  . Years of Education: college   Occupational History  . retired     worked as a Engineer, water  .     Social History Main Topics  . Smoking status: Never Smoker   . Smokeless tobacco: Never Used  . Alcohol Use: Yes     Comment: 1 drink before dinner  . Drug Use: No  . Sexual Activity: Not Asked   Other Topics Concern  . None   Social History Narrative   Pt is married and lives with wife (suzanne). Patient finished  two years of college.    Pt has  children.   Caffeine -two cups daily.   Left handed.   Retired Licensed conveyancer                    Allergies  Allergen Reactions  . Lisinopril Other (See Comments)    Hyperkalemia in the context of combined spironolactone and lisinopril therapy  . Spironolactone Other (See Comments)    Hyperkalemia in context of spironolactone and lisinopril therapy   Family History  Problem Relation Age of Onset  . Stroke Father     Died, 83  . High blood pressure Father   . Diabetes Father   . Diabetes Brother     Died, 82  . Diabetes Sister     Living, 49  . Other Mother     Died, 88  . Healthy Daughter   . Healthy Son     Past medical history, social, surgical and family history all reviewed in electronic medical record.  No pertanent information unless stated regarding to the chief complaint.   Review of  Systems: No headache, visual changes, nausea, vomiting, diarrhea, constipation, dizziness, abdominal pain, skin rash, fevers, chills, night sweats, weight loss, swollen lymph nodes, body aches, joint swelling, muscle aches, chest pain, shortness of breath, mood changes.   Objective Blood pressure 130/78, pulse 55, height 5\' 5"  (1.651 m), weight 190 lb (86.183 kg), SpO2 95 %.  General: No apparent distress alert and oriented x3 mood and affect normal, dressed appropriately.  HEENT: Pupils equal, extraocular movements intact  Respiratory: Patient's speak in full sentences and does not appear short of breath  Cardiovascular: No lower extremity edema, non tender, no erythema  Skin: Warm dry intact with no signs of infection or rash on extremities or on axial skeleton.  Abdomen: Soft nontender  Neuro: Cranial nerves II through XII are intact, neurovascularly intact in all extremities with 2+ DTRs and 2+ pulses.  Lymph: No lymphadenopathy of posterior or anterior cervical chain or axillae bilaterally.  Gait normal with good balance and coordination.  MSK:  Non tender with full range of motion and good stability and symmetric strength and tone of  elbows, wrist, hip, knee and ankles bilaterally. Arthritic changes of multiple joints  Neck: Inspection unremarkable. No palpable stepoffs. Negative Spurling's maneuver. Lacks last 10 of extension as well as side bending bilaterally but near full flexion. Grip strength and sensation normal in bilateral hands Strength good C4 to T1 distribution No sensory change to C4 to T1 Negative Hoffman sign bilaterally Reflexes normal  Shoulder: Bilateral Inspection reveals no abnormalities, atrophy or asymmetry. Palpation is normal with no tenderness over AC joint or bicipital groove. Significant decrease in range of motion with forward flexion to 70 before all crepitus and pain bilaterally. External rotation of 5 on the right and 7 on the left and internal  rotation to lateral hip on the right and sacrum on the left Rotator cuff strength 3 out of 5 but symmetric No signs of impingement with negative Neer and Hawkin's tests, empty can sign. Speeds and Yergason's tests normal. No labral pathology noted with negative Obrien's, negative clunk and good stability. Normal scapular function observed. Positive drop arm   Procedure: Real-time Ultrasound Guided Injection of right glenohumeral joint Device: GE Logiq E  Ultrasound guided injection is preferred based studies that show increased duration, increased effect, greater accuracy, decreased procedural pain, increased response rate with ultrasound guided versus blind injection.  Verbal informed consent obtained.  Time-out conducted.  Noted no overlying erythema, induration, or  other signs of local infection.  Skin prepped in a sterile fashion.  Local anesthesia: Topical Ethyl chloride.  With sterile technique and under real time ultrasound guidance:  Joint visualized.  23g 1  inch needle inserted posterior approach. Pictures taken for needle placement. Patient did have injection of 2 cc of 1% lidocaine, 2 cc of 0.5% Marcaine, and 1.0 cc of Kenalog 40 mg/dL. Completed without difficulty  Pain immediately resolved suggesting accurate placement of the medication.  Advised to call if fevers/chills, erythema, induration, drainage, or persistent bleeding.  Images permanently stored and available for review in the ultrasound unit.  Impression: Technically successful ultrasound guided injection.   Procedure: Real-time Ultrasound Guided Injection of left glenohumeral joint Device: GE Logiq E  Ultrasound guided injection is preferred based studies that show increased duration, increased effect, greater accuracy, decreased procedural pain, increased response rate with ultrasound guided versus blind injection.  Verbal informed consent obtained.  Time-out conducted.  Noted no overlying erythema, induration,  or other signs of local infection.  Skin prepped in a sterile fashion.  Local anesthesia: Topical Ethyl chloride.  With sterile technique and under real time ultrasound guidance:  Joint visualized.  23g 1  inch needle inserted posterior approach. Pictures taken for needle placement. Patient did have injection of 2 cc of 1% lidocaine, 2 cc of 0.5% Marcaine, and 1cc of Kenalog 40 mg/dL. Completed without difficulty  Pain immediately resolved suggesting accurate placement of the medication.  Advised to call if fevers/chills, erythema, induration, drainage, or persistent bleeding.  Images permanently stored and available for review in the ultrasound unit.  Impression: Technically successful ultrasound guided injection.        Impression and Recommendations:     This case required medical decision making of moderate complexity.      Note: This dictation was prepared with Dragon dictation along with smaller phrase technology. Any transcriptional errors that result from this process are unintentional.

## 2015-10-18 NOTE — Assessment & Plan Note (Signed)
Patient given injections of the shoulders today. Patient did have good resolution of pain today. We discussed that the functionality of the shoulders are likely not improve. We discussed that the crepitus. There as well. Only thing to cure that would be possible shoulder replacement but would not have significant increase in functionality which patient declined. Patient states that if the injections help he would consider an every 10-12 weeks. Given a trial of topical anti-inflammatories to try. We discussed continuing the vitamin D supplementation. We discussed icing regimen. We talked about the possibility of formal physical therapy but more importantly just more range of motion. Patient will come back again in 3-4 weeks to discuss. Differential includes a cervical radiculopathy once again I do not feel patient will make significant improvement even with epidurals of the neck. This will be more chronic in maintaining patient's quality more than improvement.

## 2015-10-18 NOTE — Patient Instructions (Signed)
Good to see you Ice is your friend Ice 20 minutes 2 times daily. Usually after activity and before bed. pennsaid pinkie amount topically 2 times daily as needed.  Continue the vitamin D Consider turmeric 500mg  daily I would also try tylenol 325mg  with every meal to see if that would help as well Tart cherry extract at night may help with sleep  And the stifness in the morning See me again in 4 weeks to see how you are doing.

## 2015-11-07 ENCOUNTER — Ambulatory Visit (INDEPENDENT_AMBULATORY_CARE_PROVIDER_SITE_OTHER): Payer: Medicare Other

## 2015-11-07 VITALS — BP 142/70 | Ht 66.0 in | Wt 186.2 lb

## 2015-11-07 DIAGNOSIS — Z0000 Encounter for general adult medical examination without abnormal findings: Secondary | ICD-10-CM | POA: Diagnosis not present

## 2015-11-07 NOTE — Progress Notes (Addendum)
Subjective:   Paul Mays is a 82 y.o. male who presents for Medicare Annual/Subsequent preventive examination.  Review of Systems:  HRA assessment completed during visit;  The Patient was informed that this wellness visit is to identify risk and educate on how to reduce risk for increase disease through lifestyle changes.   ROS deferred to CPE exam with physician  Caregiver to wife; discussed memory loss; Neurology 23/30 and discussed mild impairment; Still driving but feels she is getting perhaps a little worse. Discussed informant assessment and to note changes and if this continues to reschedule with Dr. Quay Burow or neurologist; [facility] Brett Fair[facility] at [facility]; Did not give further info at there is no dx.   Smoking; neg  ETOH: Drinks 2 drinks prior to dinner and that is it;  Medical issues c/o of neuropathy; gabapentin 600mg  once at hs and am which helps Falls; no falls;  Shoulders; has a hard time rasing arm and Dr. Tamala Julian gave medicine that helped  BMI: 29.1; has lost a few pounds since Christmas  Diet; wife is not cooking; the patient cooks Breakfast; Cereal and fresh strawberries; or english muffin Lunch; skips most days;  Tuna fish salad; deviled eggs; avocado; oil and vinegar tonight;  Always prepares a meal at supper  Exercise; has a dog that makes him walk q day / walks 3 to 4 blocks Walks in the afternoon; States he also walks during the day due to general shopping  SAFETY lives in [place]; married; 93 yo wife; Safety reviewed for the home; 2 level; bedrooms are upstairs but has a place to stay downstairs as the age or become more disabled. Does not want to go to retirement center;  Railings on both sides of stairs Fall information given at [employer];   Bathroom safety; shower is in tub upstairs but geriatric bathroom downstairs and is fully handicapped accessible; Community safe; yes  Smoke detectors; yes Firearms safety reviewed Driving accidents and seatbelt/ no  accidents Sun protection/ hat;   Stressors; wife has some memory issues; still being evaluated; no failures at home;   Medication review/ New meds  Fall assessment no  Gait assessment; get up and go wnl;   Mobilization and Functional losses in the last year. None but states shoulders do limit him; Enc to continue exercises as given by Dr. Tamala Julian.  Sleep patterns/ yes maybe 2 to 3 years   Urinary or fecal incontinence reviewed/ no/ states hx of colitis but not any more   Counseling: Colonoscopy; 12/2011 Dr. Henrene Pastor; has colitis EKG: 03/2015 Hearing: 2000hz  both ears Ophthalmology exam; eye exam this month/ Feb 8th  Immunizations Due zostavax;  Has not had but did have a case of the shingles; states he was told not to take it now due to age. Discussed resistance does decrease.    Current Care Team reviewed and updated  Cardiac Risk Factors include: advanced age (>53men, >51 women);hypertension    Objective:    Vitals: BP 142/70 mmHg  Ht 5\' 6"  (1.676 m)  Wt 186 lb 4 oz (84.482 kg)  BMI 30.08 kg/m2  Tobacco History  Smoking status  . Never Smoker   Smokeless tobacco  . Never Used     Counseling given: Yes   Past Medical History  Diagnosis Date  . Arthralgia   . Colitis   . Edema   . Venous insufficiency   . Olecranon bursitis   . Peripheral neuropathy ([place])   . Murmur   . Carotid stenosis   .  Aortic stenosis   . CAD (coronary artery disease)   . Hearing loss   . Anxiety   . Hyperuricemia   . Pulmonary hypertension ([place])   . Polyneuropathy ([place])   . Gout   . OSA (obstructive sleep apnea)   . Hypertension   . Hyperlipidemia   . Impacted cerumen   . Arthritis   . Migraine   . Unspecified hereditary and idiopathic peripheral neuropathy   . Migraine   . Other and unspecified hyperlipidemia   . Other malaise and fatigue   . Herpes zoster without mention of complication   . Cervicalgia   . CLUSTER HEADACHE 02/10/2007    Qualifier: Diagnosis of  By: Linna Darner  MD, Gwyndolyn Saxon     Past Surgical History  Procedure Laterality Date  . Angioplasty      w/ 2 stents in 1999  . Tonsillectomy    . Appendectomy    . Hemorrhoid surgery    . Lipoma excision     Family History  Problem Relation Age of Onset  . Stroke Father     Died, 83  . High blood pressure Father   . Diabetes Father   . Diabetes Brother     Died, 82  . Diabetes Sister     Living, 40  . Other Mother     Died, 88  . Healthy Daughter   . Healthy Son    History  Sexual Activity  . Sexual Activity: Not on file    Outpatient Encounter Prescriptions as of 11/07/2015  Medication Sig  . allopurinol (ZYLOPRIM) 300 MG tablet Take 300 mg by mouth as needed.   . Cholecalciferol (VITAMIN D3) 2000 UNITS capsule Take 1 capsule (2,000 Units total) by mouth daily.  . Flavocoxid (LIMBREL) 500 MG CAPS Takes daily  . gabapentin (NEURONTIN) 600 MG tablet Take 1 tablet (600 mg total) by mouth 2 (two) times daily.  Marland Kitchen losartan (COZAAR) 50 MG tablet Take 1 tablet (50 mg total) by mouth daily.  . mesalamine (APRISO) 0.375 G 24 hr capsule Take 375 mg by mouth. Take 4 tabs by mouth each morning   . metoprolol (LOPRESSOR) 50 MG tablet 1/2 tab by mouth two times a day.   . pravastatin (PRAVACHOL) 40 MG tablet Take 40 mg by mouth daily.     No facility-administered encounter medications on file as of 11/07/2015.    Activities of Daily Living In your present state of health, do you have any difficulty performing the following activities: 11/07/2015  Hearing? N  Vision? N  Difficulty concentrating or making decisions? N  Walking or climbing stairs? N  Dressing or bathing? N  Doing errands, shopping? N  Preparing Food and eating ? N  Using the Toilet? N  In the past six months, have you accidently leaked urine? N  Do you have problems with loss of bowel control? (No Data)  Managing your Medications? N  Managing your Finances? N  Housekeeping or managing your Housekeeping? N    Patient Care  Team: Binnie Rail, MD as PCP - General (Internal Medicine)   Assessment:    Assessment   Patient presents for yearly preventative medicine examination. Medicare questionnaire screening were completed, i.e. Functional; fall risk; depression, memory loss and hearing were unremarkable; has VA benefits and does have hearing and vision checks at [place]; Gets meds from [place]   All immunizations and health maintenance protocols were reviewed with the patient and needed orders were placed. The patient declines the shingles  Education provided for laboratory screens;  YES; reviewed and discussed  Medication reconciliation, past medical history, social history, problem list and allergies were reviewed in detail with the patient   Goals were established with regard to maintaining health;   End of life planning was discussed and has been completed; requested copy for chart.  Exercise Activities and Dietary recommendations Current Exercise Habits:: Structured exercise class, Type of exercise: walking, Time (Minutes): 30, Frequency (Times/Week): > 6, Weekly Exercise (Minutes/Week): 0, Intensity: Moderate  Goals    . patient      Trying to maintain health; continue to walk dog;       Does a lot of walking when shopping;   Fall Risk Fall Risk  11/07/2015 01/28/2013  Falls in the past year? No No   Depression Screen PHQ 2/9 Scores 11/07/2015 01/28/2013  PHQ - 2 Score 0 0    Cognitive Testing No flowsheet data found.  Ad8 score 0; Affect pleasant and approrpriate  Immunization History  Administered Date(s) Administered  . Influenza, High Dose Seasonal PF 06/30/2014  . Influenza-Unspecified 07/06/2013, 06/07/2015  . Pneumococcal Conjugate-13 06/07/2015  . Pneumococcal Polysaccharide-23 12/05/2013  . Td 02/10/2015   Screening Tests Health Maintenance  Topic Date Due  . ZOSTAVAX  11/14/1989  . INFLUENZA VACCINE  05/06/2016  . TETANUS/TDAP  02/09/2025  . PNA vac Low Risk Adult  Completed       Plan:     Will fup with neurology if he feels wife's memory issues are worse; tested 23/30 at last visit.   During the course of the visit the patient was educated and counseled about the following appropriate screening and preventive services:   Vaccines to include Pneumoccal, Influenza, Hepatitis B, Td, Zostavax, HCV/ declines zostavax and will remove  Electrocardiogram  03/2015  Cardiovascular Disease/ no c/o; BP 140/ 70 today but was going to pick up BP med this afternoon. Has been out x 1 day  Colorectal cancer screening; repeat in 5 years due to colitis   Diabetes screening/ neg  Prostate Cancer Screening/ deferred to  CPE; no issues voiced  Glaucoma screening/ vision test Feb 8th   Nutrition counseling / reviewed   Smoking cessation counseling / n/a  Patient Instructions (the written plan) was given to the patient.    [id number], RN  11/07/2015   Medical screening examination/treatment/procedure(s) were performed by non-physician practitioner and as supervising physician I was immediately available for consultation/collaboration. I agree with above. Binnie Rail, MD

## 2015-11-07 NOTE — Patient Instructions (Addendum)
[name] , Thank you for taking time to come for your Medicare Wellness Visit. I appreciate your ongoing commitment to your health goals. Please review the following plan we discussed and let me know if I can assist you in the future.   Having eye exam next week  Will decline shingles  Will continue to track wife's issues and may take to neurology if memory / recall continues to decline.  [name], [name] with [facility]   These are the goals we discussed: will maintain health and continue to walk   Goals    None      This is a list of the screening recommended for you and due dates:  Health Maintenance  Topic Date Due  . Shingles Vaccine  11/14/1989  . Flu Shot  05/06/2016  . Tetanus Vaccine  02/09/2025  . Pneumonia vaccines  Completed     Fall Prevention in the Home  Falls can cause injuries. They can happen to people of all ages. There are many things you can do to make your home safe and to help prevent falls.  WHAT CAN I DO ON THE OUTSIDE OF MY HOME?  Regularly fix the edges of walkways and driveways and fix any cracks.  Remove anything that might make you trip as you walk through a door, such as a raised step or threshold.  Trim any bushes or trees on the path to your home.  Use bright outdoor lighting.  Clear any walking paths of anything that might make someone trip, such as rocks or tools.  Regularly check to see if handrails are loose or broken. Make sure that both sides of any steps have handrails.  Any raised decks and porches should have guardrails on the edges.  Have any leaves, snow, or ice cleared regularly.  Use sand or salt on walking paths during winter.  Clean up any spills in your garage right away. This includes oil or grease spills. WHAT CAN I DO IN THE BATHROOM?   Use night lights.  Install grab bars by the toilet and in the tub and shower. Do not use towel bars as grab bars.  Use non-skid mats or decals in the tub or  shower.  If you need to sit down in the shower, use a plastic, non-slip stool.  Keep the floor dry. Clean up any water that spills on the floor as soon as it happens.  Remove soap buildup in the tub or shower regularly.  Attach bath mats securely with double-sided non-slip rug tape.  Do not have throw rugs and other things on the floor that can make you trip. WHAT CAN I DO IN THE BEDROOM?  Use night lights.  Make sure that you have a light by your bed that is easy to reach.  Do not use any sheets or blankets that are too big for your bed. They should not hang down onto the floor.  Have a firm chair that has side arms. You can use this for support while you get dressed.  Do not have throw rugs and other things on the floor that can make you trip. WHAT CAN I DO IN THE KITCHEN?  Clean up any spills right away.  Avoid walking on wet floors.  Keep items that you use a lot in easy-to-reach places.  If you need to reach something above you, use a strong step stool that has a grab bar.  Keep electrical cords out of the way.  Do not use  floor polish or wax that makes floors slippery. If you must use wax, use non-skid floor wax.  Do not have throw rugs and other things on the floor that can make you trip. WHAT CAN I DO WITH MY STAIRS?  Do not leave any items on the stairs.  Make sure that there are handrails on both sides of the stairs and use them. Fix handrails that are broken or loose. Make sure that handrails are as long as the stairways.  Check any carpeting to make sure that it is firmly attached to the stairs. Fix any carpet that is loose or worn.  Avoid having throw rugs at the top or bottom of the stairs. If you do have throw rugs, attach them to the floor with carpet tape.  Make sure that you have a light switch at the top of the stairs and the bottom of the stairs. If you do not have them, ask someone to add them for you. WHAT ELSE CAN I DO TO HELP PREVENT  FALLS?  Wear shoes that:  Do not have high heels.  Have rubber bottoms.  Are comfortable and fit you well.  Are closed at the toe. Do not wear sandals.  If you use a stepladder:  Make sure that it is fully opened. Do not climb a closed stepladder.  Make sure that both sides of the stepladder are locked into place.  Ask someone to hold it for you, if possible.  Clearly mark and make sure that you can see:  Any grab bars or handrails.  First and last steps.  Where the edge of each step is.  Use tools that help you move around (mobility aids) if they are needed. These include:  Canes.  Walkers.  Scooters.  Crutches.  Turn on the lights when you go into a dark area. Replace any light bulbs as soon as they burn out.  Set up your furniture so you have a clear path. Avoid moving your furniture around.  If any of your floors are uneven, fix them.  If there are any pets around you, be aware of where they are.  Review your medicines with your doctor. Some medicines can make you feel dizzy. This can increase your chance of falling. Ask your doctor what other things that you can do to help prevent falls.   This information is not intended to replace advice given to you by your health care provider. Make sure you discuss any questions you have with your health care provider.   Document Released: 07/19/2009 Document Revised: 02/06/2015 Document Reviewed: 10/27/2014 Elsevier Interactive Patient Education 2016 [place] Maintenance, Male A healthy lifestyle and preventative care can promote health and wellness.  Maintain regular health, dental, and eye exams.  Eat a healthy diet. Foods like vegetables, fruits, whole grains, low-fat dairy products, and lean protein foods contain the nutrients you need and are low in calories. Decrease your intake of foods high in solid fats, added sugars, and salt. Get information about a proper diet from your health care  provider, if necessary.  Regular physical exercise is one of the most important things you can do for your health. Most adults should get at least 150 minutes of moderate-intensity exercise (any activity that increases your heart rate and causes you to sweat) each week. In addition, most adults need muscle-strengthening exercises on 2 or more days a week.   Maintain a healthy weight. The body mass index (BMI) is a screening tool to  identify possible weight problems. It provides an estimate of body fat based on height and weight. Your health care provider can find your BMI and can help you achieve or maintain a healthy weight. For males 20 years and older:  A BMI below 18.5 is considered underweight.  A BMI of 18.5 to 24.9 is normal.  A BMI of 25 to 29.9 is considered overweight.  A BMI of 30 and above is considered obese.  Maintain normal blood lipids and cholesterol by exercising and minimizing your intake of saturated fat. Eat a balanced diet with plenty of fruits and vegetables. Blood tests for lipids and cholesterol should begin at age 12 and be repeated every 5 years. If your lipid or cholesterol levels are high, you are over age 63, or you are at high risk for heart disease, you may need your cholesterol levels checked more frequently.Ongoing high lipid and cholesterol levels should be treated with medicines if diet and exercise are not working.  If you smoke, find out from your health care provider how to quit. If you do not use tobacco, do not start.  Lung cancer screening is recommended for adults aged 55-80 years who are at high risk for developing lung cancer because of a history of smoking. A yearly low-dose CT scan of the lungs is recommended for people who have at least a 30-pack-year history of smoking and are current smokers or have quit within the past 15 years. A pack year of smoking is smoking an average of 1 pack of cigarettes a day for 1 year (for example, a 30-pack-year  history of smoking could mean smoking 1 pack a day for 30 years or 2 packs a day for 15 years). Yearly screening should continue until the smoker has stopped smoking for at least 15 years. Yearly screening should be stopped for people who develop a health problem that would prevent them from having lung cancer treatment.  If you choose to drink alcohol, do not have more than 2 drinks per day. One drink is considered to be 12 oz (360 mL) of beer, 5 oz (150 mL) of wine, or 1.5 oz (45 mL) of liquor.  Avoid the use of street drugs. Do not share needles with anyone. Ask for help if you need support or instructions about stopping the use of drugs.  High blood pressure causes heart disease and increases the risk of stroke. High blood pressure is more likely to develop in:  People who have blood pressure in the end of the normal range (100-139/85-89 mm Hg).  People who are overweight or obese.  People who are African American.  If you are 68-36 years of age, have your blood pressure checked every 3-5 years. If you are 68 years of age or older, have your blood pressure checked every year. You should have your blood pressure measured twice--once when you are at a hospital or clinic, and once when you are not at a hospital or clinic. Record the average of the two measurements. To check your blood pressure when you are not at a hospital or clinic, you can use:  An automated blood pressure machine at a pharmacy.  A home blood pressure monitor.  If you are 28-16 years old, ask your health care provider if you should take aspirin to prevent heart disease.  Diabetes screening involves taking a blood sample to check your fasting blood sugar level. This should be done once every 3 years after age 42 if you are at  a normal weight and without risk factors for diabetes. Testing should be considered at a younger age or be carried out more frequently if you are overweight and have at least 1 risk factor for  diabetes.  Colorectal cancer can be detected and often prevented. Most routine colorectal cancer screening begins at the age of 52 and continues through age 46. However, your health care provider may recommend screening at an earlier age if you have risk factors for colon cancer. On a yearly basis, your health care provider may provide home test kits to check for hidden blood in the stool. A small camera at the end of a tube may be used to directly examine the colon (sigmoidoscopy or colonoscopy) to detect the earliest forms of colorectal cancer. Talk to your health care provider about this at age 74 when routine screening begins. A direct exam of the colon should be repeated every 5-10 years through age 41, unless early forms of precancerous polyps or small growths are found.  People who are at an increased risk for hepatitis B should be screened for this virus. You are considered at high risk for hepatitis B if:  You were born in a country where hepatitis B occurs often. Talk with your health care provider about which countries are considered high risk.  Your parents were born in a high-risk country and you have not received a shot to protect against hepatitis B (hepatitis B vaccine).  You have HIV or AIDS.  You use needles to inject street drugs.  You live with, or have sex with, someone who has hepatitis B.  You are a man who has sex with other men (MSM).  You get hemodialysis treatment.  You take certain medicines for conditions like cancer, organ transplantation, and autoimmune conditions.  Hepatitis C blood testing is recommended for all people born from [birth year] through 1963 and any individual with known risk factors for hepatitis C.  Healthy men should no longer receive prostate-specific antigen (PSA) blood tests as part of routine cancer screening. Talk to your health care provider about prostate cancer screening.  Testicular cancer screening is not recommended for adolescents or  adult males who have no symptoms. Screening includes self-exam, a health care provider exam, and other screening tests. Consult with your health care provider about any symptoms you have or any concerns you have about testicular cancer.  Practice safe sex. Use condoms and avoid high-risk sexual practices to reduce the spread of sexually transmitted infections (STIs).  You should be screened for STIs, including gonorrhea and chlamydia if:  You are sexually active and are younger than 24 years.  You are older than 24 years, and your health care provider tells you that you are at risk for this type of infection.  Your sexual activity has changed since you were last screened, and you are at an increased risk for chlamydia or gonorrhea. Ask your health care provider if you are at risk.  If you are at risk of being infected with HIV, it is recommended that you take a prescription medicine daily to prevent HIV infection. This is called pre-exposure prophylaxis (PrEP). You are considered at risk if:  You are a man who has sex with other men (MSM).  You are a heterosexual man who is sexually active with multiple partners.  You take drugs by injection.  You are sexually active with a partner who has HIV.  Talk with your health care provider about whether you are at high risk of  being infected with HIV. If you choose to begin PrEP, you should first be tested for HIV. You should then be tested every 3 months for as long as you are taking PrEP.  Use sunscreen. Apply sunscreen liberally and repeatedly throughout the day. You should seek shade when your shadow is shorter than you. Protect yourself by wearing long sleeves, pants, a wide-brimmed hat, and sunglasses year round whenever you are outdoors.  Tell your health care provider of new moles or changes in moles, especially if there is a change in shape or color. Also, tell your health care provider if a mole is larger than the size of a pencil  eraser.  A one-time screening for abdominal aortic aneurysm (AAA) and surgical repair of large AAAs by ultrasound is recommended for men aged 65-75 years who are current or former smokers.  Stay current with your vaccines (immunizations).   This information is not intended to replace advice given to you by your health care provider. Make sure you discuss any questions you have with your health care provider.   Document Released: 03/20/2008 Document Revised: 10/13/2014 Document Reviewed: 02/17/2011 Elsevier Interactive Patient Education [employer].

## 2015-11-15 ENCOUNTER — Ambulatory Visit: Payer: Medicare Other | Admitting: Family Medicine

## 2015-11-16 ENCOUNTER — Ambulatory Visit (INDEPENDENT_AMBULATORY_CARE_PROVIDER_SITE_OTHER): Payer: Medicare Other | Admitting: Family

## 2015-11-16 ENCOUNTER — Encounter: Payer: Self-pay | Admitting: Family

## 2015-11-16 VITALS — BP 134/86 | HR 72 | Temp 98.1°F | Resp 18 | Ht 65.0 in | Wt 183.0 lb

## 2015-11-16 DIAGNOSIS — J209 Acute bronchitis, unspecified: Secondary | ICD-10-CM

## 2015-11-16 MED ORDER — AZITHROMYCIN 250 MG PO TABS: ORAL_TABLET | ORAL | Status: DC

## 2015-11-16 NOTE — Assessment & Plan Note (Signed)
Symptoms and exam consistent with bronchitis given wheezing. Start azithromycin. Sample of Breo provided. Continue over-the-counter medications as needed for symptom relief and supportive care. Follow-up if symptoms worsen or fail to improve.

## 2015-11-16 NOTE — Progress Notes (Signed)
Subjective:    Patient ID: EDIL RUFUS, male    DOB: July 30, 1928, 82 y.o.   MRN: [id number]  Chief Complaint  Patient presents with  . Cough    starting on tuesday, having congestion, productive cough, chills, no nausea, vomitting or diarrhea    HPI:  MARREO USELMAN is a 82 y.o. male who  has a past medical history of Arthralgia; Colitis; Edema; Venous insufficiency; Olecranon bursitis; Peripheral neuropathy ([place]); Murmur; Carotid stenosis; Aortic stenosis; CAD (coronary artery disease); Hearing loss; Anxiety; Hyperuricemia; Pulmonary hypertension ([place]); Polyneuropathy ([place]); Gout; OSA (obstructive sleep apnea); Hypertension; Hyperlipidemia; Impacted cerumen; Arthritis; Migraine; Unspecified hereditary and idiopathic peripheral neuropathy; Migraine; Other and unspecified hyperlipidemia; Other malaise and fatigue; Herpes zoster without mention of complication; Cervicalgia; and CLUSTER HEADACHE (02/10/2007). and presents today for an acute office vist.   This is a new problem. Associated symptom of congestion, productive cough, and chills has been going on for about 4 days. Severity of the symptoms is enough to disturb his sleep pattern. Denies fevers, nausea, vomiting or diarreha. Modifying factors aspirin which did not help very much with his symptoms. Denies recent antibiotic use.   Allergies  Allergen Reactions  . Lisinopril Other (See Comments)    Hyperkalemia in the context of combined spironolactone and lisinopril therapy  . Spironolactone Other (See Comments)    Hyperkalemia in context of spironolactone and lisinopril therapy     Current Outpatient Prescriptions on File Prior to Visit  Medication Sig Dispense Refill  . allopurinol (ZYLOPRIM) 300 MG tablet Take 300 mg by mouth as needed.     . Cholecalciferol (VITAMIN D3) 2000 UNITS capsule Take 1 capsule (2,000 Units total) by mouth daily.    . Flavocoxid (LIMBREL) 500 MG CAPS Takes daily  0  . gabapentin (NEURONTIN) 600 MG  tablet Take 1 tablet (600 mg total) by mouth 2 (two) times daily. 60 tablet 0  . losartan (COZAAR) 50 MG tablet Take 1 tablet (50 mg total) by mouth daily. 30 tablet 5  . mesalamine (APRISO) 0.375 G 24 hr capsule Take 375 mg by mouth. Take 4 tabs by mouth each morning     . metoprolol (LOPRESSOR) 50 MG tablet 1/2 tab by mouth two times a day.     . pravastatin (PRAVACHOL) 40 MG tablet Take 40 mg by mouth daily.       No current facility-administered medications on file prior to visit.     Review of Systems  Constitutional: Positive for chills. Negative for fever.  HENT: Positive for congestion. Negative for ear pain, sinus pressure and sore throat.   Respiratory: Positive for cough. Negative for chest tightness, shortness of breath and wheezing.   Neurological: Negative for headaches.      Objective:    BP 134/86 mmHg  Pulse 72  Temp(Src) 98.1 F (36.7 C) (Oral)  Resp 18  Ht 5\' 5"  (1.651 m)  Wt 183 lb (83.008 kg)  BMI 30.45 kg/m2  SpO2 95% Nursing note and vital signs reviewed.  Physical Exam  Constitutional: He is oriented to person, place, and time. He appears well-developed and well-nourished. No distress.  HENT:  Right Ear: Hearing, tympanic membrane, external ear and ear canal normal.  Left Ear: Hearing, tympanic membrane, external ear and ear canal normal.  Nose: Nose normal. Right sinus exhibits no maxillary sinus tenderness and no frontal sinus tenderness. Left sinus exhibits no maxillary sinus tenderness and no frontal sinus tenderness.  Mouth/Throat: Uvula is midline, oropharynx is clear and moist  and mucous membranes are normal.  Cardiovascular: Normal rate, regular rhythm, normal heart sounds and intact distal pulses.   Pulmonary/Chest: Effort normal. He has wheezes.  Neurological: He is alert and oriented to person, place, and time.  Skin: Skin is warm and dry.  Psychiatric: He has a normal mood and affect. His behavior is normal. Judgment and thought content  normal.       Assessment & Plan:   Problem List Items Addressed This Visit      Respiratory   Acute bronchitis - Primary    Symptoms and exam consistent with bronchitis given wheezing. Start azithromycin. Sample of Breo provided. Continue over-the-counter medications as needed for symptom relief and supportive care. Follow-up if symptoms worsen or fail to improve.      Relevant Medications   azithromycin (ZITHROMAX) 250 MG tablet

## 2015-11-16 NOTE — Patient Instructions (Signed)
Thank you for choosing [facility].  Summary/Instructions:  Your prescription(s) have been submitted to your pharmacy or been printed and provided for you. Please take as directed and contact our office if you believe you are having problem(s) with the medication(s) or have any questions.  If your symptoms worsen or fail to improve, please contact our office for further instruction, or in case of emergency go directly to the emergency room at the closest medical facility.   General Recommendations:    Please drink plenty of fluids.  Get plenty of rest   Sleep in humidified air  Use saline nasal sprays  Netti pot   OTC Medications:  Decongestants - helps relieve congestion   Flonase (generic fluticasone) or Nasacort (generic triamcinolone) - please make sure to use the "cross-over" technique at a 45 degree angle towards the opposite eye as opposed to straight up the nasal passageway.   Sudafed (generic pseudoephedrine - Note this is the one that is available behind the pharmacy counter); Products with phenylephrine (-PE) may also be used but is often not as effective as pseudoephedrine.   If you have HIGH BLOOD PRESSURE - Coricidin HBP; AVOID any product that is -D as this contains pseudoephedrine which may increase your blood pressure.  Afrin (oxymetazoline) every 6-8 hours for up to 3 days.   Allergies - helps relieve runny nose, itchy eyes and sneezing   Claritin (generic loratidine), Allegra (fexofenidine), or Zyrtec (generic cyrterizine) for runny nose. These medications should not cause drowsiness.  Note - Benadryl (generic diphenhydramine) may be used however may cause drowsiness  Cough -   Delsym or Robitussin (generic dextromethorphan)  Expectorants - helps loosen mucus to ease removal   Mucinex (generic guaifenesin) as directed on the package.  Headaches / General Aches   Tylenol (generic acetaminophen) - DO NOT EXCEED 3 grams (3,000 mg) in a 24  hour time period  Advil/Motrin (generic ibuprofen)   Sore Throat -   Salt water gargle   Chloraseptic (generic benzocaine) spray or lozenges / Sucrets (generic dyclonine)     Acute Bronchitis Bronchitis is inflammation of the airways that extend from the windpipe into the lungs (bronchi). The inflammation often causes mucus to develop. This leads to a cough, which is the most common symptom of bronchitis.  In acute bronchitis, the condition usually develops suddenly and goes away over time, usually in a couple weeks. Smoking, allergies, and asthma can make bronchitis worse. Repeated episodes of bronchitis may cause further lung problems.  CAUSES Acute bronchitis is most often caused by the same virus that causes a cold. The virus can spread from person to person (contagious) through coughing, sneezing, and touching contaminated objects. SIGNS AND SYMPTOMS  4. Cough.  5. Fever.  6. Coughing up mucus.  7. Body aches.  8. Chest congestion.  9. Chills.  10. Shortness of breath.  11. Sore throat.  DIAGNOSIS  Acute bronchitis is usually diagnosed through a physical exam. Your health care provider will also ask you questions about your medical history. Tests, such as chest X-rays, are sometimes done to rule out other conditions.  TREATMENT  Acute bronchitis usually goes away in a couple weeks. Oftentimes, no medical treatment is necessary. Medicines are sometimes given for relief of fever or cough. Antibiotic medicines are usually not needed but may be prescribed in certain situations. In some cases, an inhaler may be recommended to help reduce shortness of breath and control the cough. A cool mist vaporizer may also be used to  help thin bronchial secretions and make it easier to clear the chest.  HOME CARE INSTRUCTIONS 2. Get plenty of rest.  3. Drink enough fluids to keep your urine clear or pale yellow (unless you have a medical condition that requires fluid restriction).  Increasing fluids may help thin your respiratory secretions (sputum) and reduce chest congestion, and it will prevent dehydration.  4. Take medicines only as directed by your health care provider. 5. If you were prescribed an antibiotic medicine, finish it all even if you start to feel better. 6. Avoid smoking and secondhand smoke. Exposure to cigarette smoke or irritating chemicals will make bronchitis worse. If you are a smoker, consider using nicotine gum or skin patches to help control withdrawal symptoms. Quitting smoking will help your lungs heal faster.  7. Reduce the chances of another bout of acute bronchitis by washing your hands frequently, avoiding people with cold symptoms, and trying not to touch your hands to your mouth, nose, or eyes.  8. Keep all follow-up visits as directed by your health care provider.  SEEK MEDICAL CARE IF: Your symptoms do not improve after 1 week of treatment.  SEEK IMMEDIATE MEDICAL CARE IF: 2. You develop an increased fever or chills.  3. You have chest pain.  4. You have severe shortness of breath. 5. You have bloody sputum.  6. You develop dehydration. 7. You faint or repeatedly feel like you are going to pass out. 8. You develop repeated vomiting. 9. You develop a severe headache. MAKE SURE YOU:  3. Understand these instructions. 4. Will watch your condition. 5. Will get help right away if you are not doing well or get worse.   This information is not intended to replace advice given to you by your health care provider. Make sure you discuss any questions you have with your health care provider.   Document Released: 10/30/2004 Document Revised: 10/13/2014 Document Reviewed: 03/15/2013 Elsevier Interactive Patient Education [employer].

## 2015-11-16 NOTE — Progress Notes (Signed)
Pre visit review using our clinic review tool, if applicable. No additional management support is needed unless otherwise documented below in the visit note. 

## 2015-11-21 ENCOUNTER — Ambulatory Visit: Payer: Medicare Other | Admitting: Family Medicine

## 2015-11-28 ENCOUNTER — Ambulatory Visit (INDEPENDENT_AMBULATORY_CARE_PROVIDER_SITE_OTHER): Payer: Medicare Other | Admitting: Family Medicine

## 2015-11-28 ENCOUNTER — Encounter: Payer: Self-pay | Admitting: Family Medicine

## 2015-11-28 VITALS — BP 118/60 | HR 58 | Ht 65.0 in | Wt 183.0 lb

## 2015-11-28 DIAGNOSIS — M12811 Other specific arthropathies, not elsewhere classified, right shoulder: Secondary | ICD-10-CM | POA: Diagnosis not present

## 2015-11-28 DIAGNOSIS — M12812 Other specific arthropathies, not elsewhere classified, left shoulder: Secondary | ICD-10-CM

## 2015-11-28 DIAGNOSIS — M75101 Unspecified rotator cuff tear or rupture of right shoulder, not specified as traumatic: Principal | ICD-10-CM

## 2015-11-28 DIAGNOSIS — M75102 Unspecified rotator cuff tear or rupture of left shoulder, not specified as traumatic: Principal | ICD-10-CM

## 2015-11-28 NOTE — Progress Notes (Signed)
[name] [address] [place], [place] [address] Phone: [phone] Subjective:    I'm seeing this patient by the request  of:  Binnie Rail, MD  CC: Bilateral shoulder pain follow-up [id number] SRIANSH LIZARDI is a 82 y.o. male coming in with complaint of bilateral shoulder pain. Patient was seen previously and had more of a rotator cuff arthropathy. Patient was given injections in the shoulders bilaterally. It has now been a proximal he 4 weeks as we seen patient. He is doing approximately 60% better he states. States that it is not perfect but he has improved strength as well as range of motion. Able to do daily activities much better. States that he is more comfortable at night as well. No new symptoms such as radiation down the arms or any numbness or weakness. Patient also states that his neck pain seems to be little bit better.    past medical history with imaging is consistent with probable: Patient has had x-rays of his neck were reviewed by me. Back in 2002 patient did have significant degenerative disc disease at multiple levels with some mild to moderate spinal stenosis. CT of patient's cervical spine in 2010 showed degenerative changes with mild anterior listhesis of C4 on C5   Past Medical History  Diagnosis Date  . Arthralgia   . Colitis   . Edema   . Venous insufficiency   . Olecranon bursitis   . Peripheral neuropathy ([place])   . Murmur   . Carotid stenosis   . Aortic stenosis   . CAD (coronary artery disease)   . Hearing loss   . Anxiety   . Hyperuricemia   . Pulmonary hypertension ([place])   . Polyneuropathy ([place])   . Gout   . OSA (obstructive sleep apnea)   . Hypertension   . Hyperlipidemia   . Impacted cerumen   . Arthritis   . Migraine   . Unspecified hereditary and idiopathic peripheral neuropathy   . Migraine   . Other and unspecified hyperlipidemia   . Other malaise and fatigue   . Herpes zoster without mention of  complication   . Cervicalgia   . CLUSTER HEADACHE 02/10/2007    Qualifier: Diagnosis of  By: Linna Darner MD, Gwyndolyn Saxon     Past Surgical History  Procedure Laterality Date  . Angioplasty      w/ 2 stents in 1999  . Tonsillectomy    . Appendectomy    . Hemorrhoid surgery    . Lipoma excision     Social History   Social History  . Marital Status: Married    Spouse Name: [name]  . Number of Children: 2  . Years of Education: college   Occupational History  . retired     worked as a Engineer, water  .     Social History Main Topics  . Smoking status: Never Smoker   . Smokeless tobacco: Never Used  . Alcohol Use: 0.0 oz/week    0 Standard drinks or equivalent per week     Comment: 2 drink before dinner vodka   . Drug Use: No  . Sexual Activity: Not Asked   Other Topics Concern  . None   Social History Narrative   Pt is married and lives with wife (suzanne). Patient finished  two years of college.    Pt has children.   Caffeine -two cups daily.   Left handed.   Retired Licensed conveyancer  Allergies  Allergen Reactions  . Lisinopril Other (See Comments)    Hyperkalemia in the context of combined spironolactone and lisinopril therapy  . Spironolactone Other (See Comments)    Hyperkalemia in context of spironolactone and lisinopril therapy   Family History  Problem Relation Age of Onset  . Stroke Father     Died, 83  . High blood pressure Father   . Diabetes Father   . Diabetes Brother     Died, 82  . Diabetes Sister     Living, 35  . Other Mother     Died, 88  . Healthy Daughter   . Healthy Son     Past medical history, social, surgical and family history all reviewed in electronic medical record.  No pertanent information unless stated regarding to the chief complaint.   Review of Systems: No headache, visual changes, nausea, vomiting, diarrhea, constipation, dizziness, abdominal pain, skin rash, fevers, chills, night sweats, weight  loss, swollen lymph nodes, body aches, joint swelling, muscle aches, chest pain, shortness of breath, mood changes.   Objective Blood pressure 118/60, pulse 58, height 5\' 5"  (1.651 m), weight 183 lb (83.008 kg), SpO2 98 %.  General: No apparent distress alert and oriented x3 mood and affect normal, dressed appropriately.  HEENT: Pupils equal, extraocular movements intact  Respiratory: Patient's speak in full sentences and does not appear short of breath  Cardiovascular: No lower extremity edema, non tender, no erythema  Skin: Warm dry intact with no signs of infection or rash on extremities or on axial skeleton.  Abdomen: Soft nontender  Neuro: Cranial nerves II through XII are intact, neurovascularly intact in all extremities with 2+ DTRs and 2+ pulses.  Lymph: No lymphadenopathy of posterior or anterior cervical chain or axillae bilaterally.  Gait normal with good balance and coordination.  MSK:  Non tender with full range of motion and good stability and symmetric strength and tone of  elbows, wrist, hip, knee and ankles bilaterally. Arthritic changes of multiple joints  Neck: Inspection unremarkable. No palpable stepoffs. Negative Spurling's maneuver. Lacks last 10 of extension as well as side bending bilaterally but near full flexion. Grip strength and sensation normal in bilateral hands Strength good C4 to T1 distribution No sensory change to C4 to T1 Negative Hoffman sign bilaterally Reflexes normal No change in neck  Shoulder: Bilateral Inspection reveals no abnormalities, atrophy or asymmetry. Palpation is normal with no tenderness over AC joint or bicipital groove. Increased range of motion with patient having forward flexion to 160, external rotation of 5 and internal rotation to sacrum bilaterally. Rotator cuff strength 4 out of 5 but symmetric which is an improvement Mild positive impingement Speeds and Yergason's tests normal. No labral pathology noted with negative  Obrien's, negative clunk and good stability. Normal scapular function observed. Negative drop arm     Impression and Recommendations:     This case required medical decision making of moderate complexity.      Note: This dictation was prepared with Dragon dictation along with smaller phrase technology. Any transcriptional errors that result from this process are unintentional.

## 2015-11-28 NOTE — Patient Instructions (Signed)
You are doing great  Don't change a thing See me again in 22 months if you need me and we can repeat injection  Otherwise see me when you need me.

## 2015-11-28 NOTE — Assessment & Plan Note (Signed)
Significant improvement since injections. Encourage him to continue home exercises, icing, and patient will continue with the gabapentin. I do think that cervical radiculopathy is plain somewhat of a role. If worsening symptoms an epidural may be necessary. Can repeat injections in the shoulder every tender to weeks if necessary. Patient will follow-up in 2 months.

## 2015-11-28 NOTE — Progress Notes (Signed)
Pre visit review using our clinic review tool, if applicable. No additional management support is needed unless otherwise documented below in the visit note. 

## 2015-11-29 ENCOUNTER — Telehealth: Payer: Self-pay

## 2015-11-29 NOTE — Telephone Encounter (Signed)
Handicap placard completed and placed on MD's desk for signature  Call [phone] when ready

## 2015-11-30 NOTE — Telephone Encounter (Signed)
Pt advised, handicap placard in cabinet for pt pick up

## 2015-12-10 DIAGNOSIS — H35371 Puckering of macula, right eye: Secondary | ICD-10-CM | POA: Diagnosis not present

## 2015-12-10 DIAGNOSIS — Z961 Presence of intraocular lens: Secondary | ICD-10-CM | POA: Diagnosis not present

## 2015-12-10 DIAGNOSIS — Z0100 Encounter for examination of eyes and vision without abnormal findings: Secondary | ICD-10-CM | POA: Diagnosis not present

## 2016-01-02 DIAGNOSIS — D485 Neoplasm of uncertain behavior of skin: Secondary | ICD-10-CM | POA: Diagnosis not present

## 2016-01-02 DIAGNOSIS — L720 Epidermal cyst: Secondary | ICD-10-CM | POA: Diagnosis not present

## 2016-01-02 DIAGNOSIS — L570 Actinic keratosis: Secondary | ICD-10-CM | POA: Diagnosis not present

## 2016-01-02 DIAGNOSIS — D1801 Hemangioma of skin and subcutaneous tissue: Secondary | ICD-10-CM | POA: Diagnosis not present

## 2016-01-02 DIAGNOSIS — L821 Other seborrheic keratosis: Secondary | ICD-10-CM | POA: Diagnosis not present

## 2016-01-02 DIAGNOSIS — D170 Benign lipomatous neoplasm of skin and subcutaneous tissue of head, face and neck: Secondary | ICD-10-CM | POA: Diagnosis not present

## 2016-01-02 DIAGNOSIS — D692 Other nonthrombocytopenic purpura: Secondary | ICD-10-CM | POA: Diagnosis not present

## 2016-01-02 DIAGNOSIS — L853 Xerosis cutis: Secondary | ICD-10-CM | POA: Diagnosis not present

## 2016-01-02 DIAGNOSIS — D2272 Melanocytic nevi of left lower limb, including hip: Secondary | ICD-10-CM | POA: Diagnosis not present

## 2016-01-02 DIAGNOSIS — L814 Other melanin hyperpigmentation: Secondary | ICD-10-CM | POA: Diagnosis not present

## 2016-01-02 DIAGNOSIS — Z85828 Personal history of other malignant neoplasm of skin: Secondary | ICD-10-CM | POA: Diagnosis not present

## 2016-01-02 DIAGNOSIS — L820 Inflamed seborrheic keratosis: Secondary | ICD-10-CM | POA: Diagnosis not present

## 2016-02-01 DIAGNOSIS — M1712 Unilateral primary osteoarthritis, left knee: Secondary | ICD-10-CM | POA: Diagnosis not present

## 2016-03-05 ENCOUNTER — Encounter: Payer: Self-pay | Admitting: Family Medicine

## 2016-03-05 ENCOUNTER — Ambulatory Visit (INDEPENDENT_AMBULATORY_CARE_PROVIDER_SITE_OTHER): Payer: Medicare Other | Admitting: Family Medicine

## 2016-03-05 ENCOUNTER — Other Ambulatory Visit: Payer: Self-pay

## 2016-03-05 VITALS — BP 132/72 | HR 50 | Ht 65.0 in | Wt 186.0 lb

## 2016-03-05 DIAGNOSIS — M12812 Other specific arthropathies, not elsewhere classified, left shoulder: Secondary | ICD-10-CM

## 2016-03-05 DIAGNOSIS — M12811 Other specific arthropathies, not elsewhere classified, right shoulder: Secondary | ICD-10-CM | POA: Diagnosis not present

## 2016-03-05 DIAGNOSIS — M25511 Pain in right shoulder: Secondary | ICD-10-CM

## 2016-03-05 DIAGNOSIS — M75101 Unspecified rotator cuff tear or rupture of right shoulder, not specified as traumatic: Secondary | ICD-10-CM

## 2016-03-05 DIAGNOSIS — M25512 Pain in left shoulder: Principal | ICD-10-CM

## 2016-03-05 DIAGNOSIS — M75102 Unspecified rotator cuff tear or rupture of left shoulder, not specified as traumatic: Secondary | ICD-10-CM

## 2016-03-05 NOTE — Assessment & Plan Note (Signed)
Patient was given bilateral injections again today. Tolerated the procedure well. We discussed continuing the icing regimen. Patient will try the topical anti-inflammatories. Patient will come back and see me again in 12 weeks. Continued have pain we can repeat injections. Patient otherwise if injections do not help he would be looking and possible shoulder impingement with patient's other comorbidities I do not think that this is a possibility  Spent  25 minutes with patient face-to-face and had greater than 50% of counseling including as described above in assessment and plan.

## 2016-03-05 NOTE — Progress Notes (Signed)
[name] [place], [place] [address] Phone: [phone] Subjective:    I'm seeing this patient by the request  of:  Binnie Rail, MD  CC: Bilateral shoulder pain follow-up [id number] Paul Mays is a 82 y.o. male coming in with complaint of bilateral shoulder pain. Patient was seen previously and had more of a rotator cuff arthropathy. Patient was given injections in the shoulders bilaterally.  This is greater than 4 months ago. Having worsening symptoms at this time. Very similar to what he had previously. States that it is severe enough that stops him from such daily activities is coming his hair. Can be very painful at night. Denies any radiation down the arm but states that he has some mild weakness. Significant amount of audible popping he hears that is painful.    past medical history with imaging is consistent with probable: Patient has had x-rays of his neck were reviewed by me. Back in 2002 patient did have significant degenerative disc disease at multiple levels with some mild to moderate spinal stenosis. CT of patient's cervical spine in 2010 showed degenerative changes with mild anterior listhesis of C4 on C5   Past Medical History  Diagnosis Date  . Arthralgia   . Colitis   . Edema   . Venous insufficiency   . Olecranon bursitis   . Peripheral neuropathy ([place] )   . Murmur   . Carotid stenosis   . Aortic stenosis   . CAD (coronary artery disease)   . Hearing loss   . Anxiety   . Hyperuricemia   . Pulmonary hypertension ([place])   . Polyneuropathy ([place])   . Gout   . OSA (obstructive sleep apnea)   . Hypertension   . Hyperlipidemia   . Impacted cerumen   . Arthritis   . Migraine   . Unspecified hereditary and idiopathic peripheral neuropathy   . Migraine   . Other and unspecified hyperlipidemia   . Other malaise and fatigue   . Herpes zoster without mention of complication   . Cervicalgia   . CLUSTER HEADACHE  02/10/2007    Qualifier: Diagnosis of  By: Linna Darner MD, Gwyndolyn Saxon     Past Surgical History  Procedure Laterality Date  . Angioplasty      w/ 2 stents in 1999  . Tonsillectomy    . Appendectomy    . Hemorrhoid surgery    . Lipoma excision     Social History   Social History  . Marital Status: Married    Spouse Name: Paul Mays  . Number of Children: 2  . Years of Education: college   Occupational History  . retired     worked as a Engineer, water  .     Social History Main Topics  . Smoking status: Never Smoker   . Smokeless tobacco: Never Used  . Alcohol Use: 0.0 oz/week    0 Standard drinks or equivalent per week     Comment: 2 drink before dinner vodka   . Drug Use: No  . Sexual Activity: Not Asked   Other Topics Concern  . None   Social History Narrative   Pt is married and lives with wife (suzanne). Patient finished  two years of college.    Pt has children.   Caffeine -two cups daily.   Left handed.   Retired Licensed conveyancer  Allergies  Allergen Reactions  . Lisinopril Other (See Comments)    Hyperkalemia in the context of combined spironolactone and lisinopril therapy  . Spironolactone Other (See Comments)    Hyperkalemia in context of spironolactone and lisinopril therapy   Family History  Problem Relation Age of Onset  . Stroke Father     Died, 83  . High blood pressure Father   . Diabetes Father   . Diabetes Brother     Died, 82  . Diabetes Sister     Living, 72  . Other Mother     Died, 88  . Healthy Daughter   . Healthy Son     Past medical history, social, surgical and family history all reviewed in electronic medical record.  No pertanent information unless stated regarding to the chief complaint.   Review of Systems: No headache, visual changes, nausea, vomiting, diarrhea, constipation, dizziness, abdominal pain, skin rash, fevers, chills, night sweats, weight loss, swollen lymph nodes, joint swelling, chest pain,  shortness of breath, mood changes.   Objective Blood pressure 132/72, pulse 50, height 5\' 5"  (1.651 m), weight 186 lb (84.369 kg), SpO2 94 %.  General: No apparent distress alert and oriented x3 mood and affect normal, dressed appropriately.  HEENT: Pupils equal, extraocular movements intact  Respiratory: Patient's speak in full sentences and does not appear short of breath  Cardiovascular: No lower extremity edema, non tender, no erythema  Skin: Warm dry intact with no signs of infection or rash on extremities or on axial skeleton.  Abdomen: Soft nontender  Neuro: Cranial nerves II through XII are intact, neurovascularly intact in all extremities with 2+ DTRs and 2+ pulses.  Lymph: No lymphadenopathy of posterior or anterior cervical chain or axillae bilaterally.  Gait normal with good balance and coordination.  MSK:  Non tender with full range of motion and good stability and symmetric strength and tone of  elbows, wrist, hip, knee and ankles bilaterally. Arthritic changes of multiple joints  Neck: Inspection unremarkable. No palpable stepoffs. Negative Spurling's maneuver. Lacks last 10 of extension as well as side bending bilaterally but near full flexion. Grip strength and sensation normal in bilateral hands Strength good C4 to T1 distribution No sensory change to C4 to T1 Negative Hoffman sign bilaterally Reflexes normal No change in neck  Shoulder: Bilateral Inspection reveals no abnormalities, atrophy or asymmetry. Palpation is normal with no tenderness over AC joint or bicipital groove. Forward flexion of 140, external rotation of 5 bilaterally, internal rotation to lateral hip bilaterally with crepitus. Rotator cuff strength 4 out of 5 but symmetric positive impingement Speeds and Yergason's tests normal. No labral pathology noted with negative Obrien's, negative clunk and good stability. Normal scapular function observed. Negative drop arm  Procedure: Real-time  Ultrasound Guided Injection of right glenohumeral joint Device: GE Logiq E  Ultrasound guided injection is preferred based studies that show increased duration, increased effect, greater accuracy, decreased procedural pain, increased response rate with ultrasound guided versus blind injection.  Verbal informed consent obtained.  Time-out conducted.  Noted no overlying erythema, induration, or other signs of local infection.  Skin prepped in a sterile fashion.  Local anesthesia: Topical Ethyl chloride.  With sterile technique and under real time ultrasound guidance:  Joint visualized.  23g 1  inch needle inserted posterior approach. Pictures taken for needle placement. Patient did have injection of 2 cc of 1% lidocaine, 2 cc of 0.5% Marcaine, and 1.0 cc of Kenalog 40 mg/dL. Completed without difficulty  Pain immediately  resolved suggesting accurate placement of the medication.  Advised to call if fevers/chills, erythema, induration, drainage, or persistent bleeding.  Images permanently stored and available for review in the ultrasound unit.  Impression: Technically successful ultrasound guided injection.   Procedure: Real-time Ultrasound Guided Injection of left glenohumeral joint Device: GE Logiq E  Ultrasound guided injection is preferred based studies that show increased duration, increased effect, greater accuracy, decreased procedural pain, increased response rate with ultrasound guided versus blind injection.  Verbal informed consent obtained.  Time-out conducted.  Noted no overlying erythema, induration, or other signs of local infection.  Skin prepped in a sterile fashion.  Local anesthesia: Topical Ethyl chloride.  With sterile technique and under real time ultrasound guidance:  Joint visualized.  23g 1  inch needle inserted posterior approach. Pictures taken for needle placement. Patient did have injection of 2 cc of 1% lidocaine, 2 cc of 0.5% Marcaine, and 1cc of Kenalog 40  mg/dL. Completed without difficulty  Pain immediately resolved suggesting accurate placement of the medication.  Advised to call if fevers/chills, erythema, induration, drainage, or persistent bleeding.  Images permanently stored and available for review in the ultrasound unit.  Impression: Technically successful ultrasound guided injection.      Impression and Recommendations:     This case required medical decision making of moderate complexity.      Note: This dictation was prepared with Dragon dictation along with smaller phrase technology. Any transcriptional errors that result from this process are unintentional.

## 2016-03-05 NOTE — Patient Instructions (Signed)
Good to see yo u Paul Mays is your friend  Stay active but avoid overhead activity when you  Can  I will repeat injections every 10-12 weeks if needed I would make another appointment in 12 weeks.

## 2016-03-05 NOTE — Progress Notes (Signed)
Pre visit review using our clinic review tool, if applicable. No additional management support is needed unless otherwise documented below in the visit note. 

## 2016-03-17 ENCOUNTER — Ambulatory Visit: Payer: Medicare Other | Admitting: Cardiovascular Disease

## 2016-03-28 ENCOUNTER — Encounter: Payer: Self-pay | Admitting: Cardiovascular Disease

## 2016-03-28 ENCOUNTER — Ambulatory Visit (INDEPENDENT_AMBULATORY_CARE_PROVIDER_SITE_OTHER): Payer: Medicare Other | Admitting: Cardiovascular Disease

## 2016-03-28 VITALS — BP 176/80 | HR 52 | Ht 65.0 in | Wt 183.8 lb

## 2016-03-28 DIAGNOSIS — I359 Nonrheumatic aortic valve disorder, unspecified: Secondary | ICD-10-CM | POA: Diagnosis not present

## 2016-03-28 DIAGNOSIS — I10 Essential (primary) hypertension: Secondary | ICD-10-CM

## 2016-03-28 DIAGNOSIS — I2510 Atherosclerotic heart disease of native coronary artery without angina pectoris: Secondary | ICD-10-CM | POA: Diagnosis not present

## 2016-03-28 NOTE — Patient Instructions (Signed)

## 2016-03-28 NOTE — Progress Notes (Signed)
Cardiology Office Note Date:  03/28/2016   ID:  Paul Mays, DOB 1928-08-16, MRN [id number]  PCP:  Binnie Rail, MD  Cardiologist:  Sherren Mocha, MD    Chief Complaint  Patient presents with  . Hypertension    has swelling in ankles sometimes. no cp,sob,lee  . Coronary Artery Disease  . Aortic Stenosis  . carotid stenosis     History of Present Illness: Paul Mays is a 82 y.o. male who presents for Follow-up evaluation. The patient has coronary artery disease with history of remote PCI. He also has hypertension and LVH with septal hypertrophy and LV outflow tract obstruction. When he was seen last year he complained of episodes of shortness of breath waking him from sleep. An echocardiogram was repeated and showed stable findings with hyperdynamic LV function, LVEF 65-70%, LV outflow tract obstruction with peak and mean gradients of 25 and 14 mmHg, respectively. There was aortic valve thickening with minimal restriction of leaflet motion. There was systolic anterior motion of the mitral valve. Estimated PA pressure was 51 mmHg.  But he is doing well. He's had no chest pain or shortness of breath. He admits to mild leg swelling, but no orthopnea or PND. He just got off of a long phone call of 2 hours with someone trying to help him with his computer. He was very frustrated and feels like his blood pressure is only elevated because of this. He still feels "worked up" about it. Otherwise he is doing fine and has no complaints.   Past Medical History  Diagnosis Date  . Arthralgia   . Colitis   . Edema   . Venous insufficiency   . Olecranon bursitis   . Peripheral neuropathy ([place])   . Murmur   . Carotid stenosis   . Aortic stenosis   . CAD (coronary artery disease)   . Hearing loss   . Anxiety   . Hyperuricemia   . Pulmonary hypertension ([place])   . Polyneuropathy ([place])   . Gout   . OSA (obstructive sleep apnea)   . Hypertension   . Hyperlipidemia   . Impacted cerumen    . Arthritis   . Migraine   . Unspecified hereditary and idiopathic peripheral neuropathy   . Migraine   . Other and unspecified hyperlipidemia   . Other malaise and fatigue   . Herpes zoster without mention of complication   . Cervicalgia   . CLUSTER HEADACHE 02/10/2007    Qualifier: Diagnosis of  By: Linna Darner MD, Gwyndolyn Saxon      Past Surgical History  Procedure Laterality Date  . Angioplasty      w/ 2 stents in 1999  . Tonsillectomy    . Appendectomy    . Hemorrhoid surgery    . Lipoma excision      Current Outpatient Prescriptions  Medication Sig Dispense Refill  . allopurinol (ZYLOPRIM) 300 MG tablet Take 300 mg by mouth daily as needed (gout flare).     . Cholecalciferol (VITAMIN D3) 2000 UNITS capsule Take 1 capsule (2,000 Units total) by mouth daily.    . Flavocoxid (LIMBREL) 500 MG CAPS Take 500 mg by mouth daily.    Marland Kitchen gabapentin (NEURONTIN) 600 MG tablet Take 1 tablet (600 mg total) by mouth 2 (two) times daily. 60 tablet 0  . losartan (COZAAR) 50 MG tablet Take 1 tablet (50 mg total) by mouth daily. 30 tablet 5  . mesalamine (APRISO) 0.375 G 24 hr capsule Take 375 mg by  mouth. Take 4 tabs by mouth each morning     . metoprolol (LOPRESSOR) 50 MG tablet 1/2 tab by mouth two times a day.     . pravastatin (PRAVACHOL) 40 MG tablet Take 40 mg by mouth daily.       No current facility-administered medications for this visit.    Allergies:   Lisinopril and Spironolactone   Social History:  The patient  reports that he has never smoked. He has never used smokeless tobacco. He reports that he drinks alcohol. He reports that he does not use illicit drugs.   Family History:  The patient's  family history includes Diabetes in his brother, father, and sister; Healthy in his daughter and son; High blood pressure in his father; Other in his mother; Stroke in his father.    ROS:  Please see the history of present illness.  Otherwise, review of systems is positive for Shoulder pain  related to arthritis.  All other systems are reviewed and negative.    PHYSICAL EXAM: VS:  BP 176/80 mmHg  Pulse 52  Ht 5\' 5"  (1.651 m)  Wt 183 lb 12.8 oz (83.371 kg)  BMI 30.59 kg/m2 , BMI Body mass index is 30.59 kg/(m^2). GEN: Well nourished, well developed, in no acute distress HEENT: normal Neck: no JVD, no masses. No carotid bruits Cardiac: RRR with 2/6 systolic murmur at the left sternal border             Respiratory:  clear to auscultation bilaterally, normal work of breathing GI: soft, nontender, nondistended, + BS MS: no deformity or atrophy Ext: trace pretibial edema Skin: warm and dry, no rash Neuro:  Strength and sensation are intact Psych: euthymic mood, full affect  EKG:  EKG is ordered today. The ekg ordered today shows sinus bradycardia 52 bpm, left axis deviation, nonspecific T-wave changes  Recent Labs: 09/24/2015: ALT 18; BUN 18; Creatinine, Ser 1.15; Hemoglobin 13.7; Platelets 113.0*; Potassium 4.7; Sodium 142; TSH 1.97   Lipid Panel     Component Value Date/Time   CHOL 157 09/24/2015 1108   TRIG 58.0 09/24/2015 1108   HDL 68.90 09/24/2015 1108   CHOLHDL 2 09/24/2015 1108   VLDL 11.6 09/24/2015 1108   LDLCALC 76 09/24/2015 1108      Wt Readings from Last 3 Encounters:  03/28/16 183 lb 12.8 oz (83.371 kg)  03/05/16 186 lb (84.369 kg)  11/28/15 183 lb (83.008 kg)     Cardiac Studies Reviewed: 2D Echo 2016: Study Conclusions  - Left ventricle: LVOT is narrow. There is turbulent flow through  the LVOT at rest . Peak and mean gradients through the LVOT/AV  are 25 and 14 mm Hg respectively The cavity size was normal. Wall  thickness was increased in a pattern of mild LVH. Systolic  function was vigorous. The estimated ejection fraction was in the  range of 65% to 70%. Doppler parameters are consistent with  abnormal left ventricular relaxation (grade 1 diastolic  dysfunction). - Aortic valve: AV is thickened with minimally restricted  motion.  There was mild regurgitation. - Mitral valve: Mild systolic anterior motion of the anteior mitral  leaflet. Calcified annulus. Mildly thickened leaflets . - Left atrium: The atrium was severely dilated. - Pulmonary arteries: PA peak pressure: 51 mm Hg (S).  ASSESSMENT AND PLAN: 1.  CAD, native vessel: No symptoms of angina. Continue current medicines. No changes are made today.  2. Senile hypertrophic cardiomyopathy: NYHA functional class I symptoms. Patient is doing fine. I reviewed  his last echo from one year ago. No indication to repeat at this time.  3. Aortic stenosis, mild: Clinical follow-up planned.  4. Essential hypertension, uncontrolled: Suspect this is related to the encounter he just had it over the telephone. He felt very frustrated with this and had a feeling that his blood pressure was high. I reviewed the pressure readings over multiple visits and most of them are in range. We'll continue to observe for now.  5. Hyperlipidemia: The patient is treated with pravastatin.  Current medicines are reviewed with the patient today.  The patient does not have concerns regarding medicines.  Labs/ tests ordered today include:  No orders of the defined types were placed in this encounter.    Disposition:   FU one year  Signed, Sherren Mocha, MD  03/28/2016 4:13 PM    [facility] [address], [place], [place]  [address] Phone: [phone]; Fax: [phone]

## 2016-04-01 ENCOUNTER — Other Ambulatory Visit: Payer: Self-pay | Admitting: *Deleted

## 2016-04-01 DIAGNOSIS — I10 Essential (primary) hypertension: Secondary | ICD-10-CM

## 2016-04-01 MED ORDER — LOSARTAN POTASSIUM 50 MG PO TABS: 50.0000 mg | ORAL_TABLET | Freq: Every day | ORAL | Status: DC

## 2016-04-09 ENCOUNTER — Ambulatory Visit: Payer: Medicare Other | Admitting: Internal Medicine

## 2016-04-13 NOTE — Progress Notes (Signed)
Subjective:    Patient ID: Paul Mays, male    DOB: 1928-07-18, 82 y.o.   MRN: [id number]  HPI He is here for follow up.  CAD, HCM, mild AS, Hypertension: He is taking his medication daily. He is compliant with a low sodium diet.  He denies chest pain, palpitations, shortness of breath and regular headaches. He is exercising regularly - walking dog.  He does not monitor his blood pressure at home.    Hyperlipidemia: He is taking his medication daily. He is compliant with a low fat/cholesterol diet. He is exercising regularly - walking dog. He denies myalgias.   Gout:  No recent flares - last attack over two years ago.  He is taking allopurinol daily, colchicine as needed.  Peripheral neuropathy:  He is taking gabapentin twice daily.  He follows with neurology.  The gabapentin makes him a little lightheaded at times.    Medications and allergies reviewed with patient and updated if appropriate.  Patient Active Problem List   Diagnosis Date Noted  . Acute bronchitis 11/16/2015  . Rotator cuff tear arthropathy of both shoulders 10/18/2015  . Thrombocytopenia ([place]) 09/24/2015  . Diverticulosis of colon without hemorrhage 07/02/2014  . Hypertrophic cardiomyopathy ([place]) 03/24/2011  . OBSTRUCTIVE SLEEP APNEA 11/08/2010  . HYPERTENSION, PULMONARY 09/16/2010  . Hyperuricemia 11/28/2009  . ANXIETY 11/09/2009  . HEARING LOSS, BILATERAL 06/18/2009  . Coronary atherosclerosis 03/07/2009  . Aortic valve disorder 03/07/2009  . CAROTID STENOSIS 03/07/2009  . Hyperlipidemia 11/15/2008  . Peripheral neuropathy, idiopathic 11/15/2008  . Essential hypertension 11/15/2008  . ARTHRALGIA 11/15/2008  . COLITIS, HX OF 11/15/2008  . OLECRANON BURSITIS, RIGHT 09/14/2007  . VENOUS INSUFFICIENCY 04/20/2007  . EDEMA 04/20/2007  . GOUT 02/10/2007  . MURMUR 02/10/2007  . PSTPRC STATUS, PTCA 02/10/2007    Current Outpatient Prescriptions on File Prior to Visit  Medication Sig Dispense Refill  .  allopurinol (ZYLOPRIM) 300 MG tablet Take 300 mg by mouth daily as needed (gout flare).     . Cholecalciferol (VITAMIN D3) 2000 UNITS capsule Take 1 capsule (2,000 Units total) by mouth daily.    . Flavocoxid (LIMBREL) 500 MG CAPS Take 500 mg by mouth daily.    Marland Kitchen gabapentin (NEURONTIN) 600 MG tablet Take 1 tablet (600 mg total) by mouth 2 (two) times daily. 60 tablet 0  . losartan (COZAAR) 50 MG tablet Take 1 tablet (50 mg total) by mouth daily. 90 tablet 1  . mesalamine (APRISO) 0.375 G 24 hr capsule Take 375 mg by mouth. Reported on 04/14/2016    . metoprolol (LOPRESSOR) 50 MG tablet 1/2 tab by mouth two times a day.     . pravastatin (PRAVACHOL) 40 MG tablet Take 40 mg by mouth daily.       No current facility-administered medications on file prior to visit.    Past Medical History  Diagnosis Date  . Arthralgia   . Colitis   . Edema   . Venous insufficiency   . Olecranon bursitis   . Peripheral neuropathy ([place])   . Murmur   . Carotid stenosis   . Aortic stenosis   . CAD (coronary artery disease)   . Hearing loss   . Anxiety   . Hyperuricemia   . Pulmonary hypertension ([place])   . Polyneuropathy ([place])   . Gout   . OSA (obstructive sleep apnea)   . Hypertension   . Hyperlipidemia   . Impacted cerumen   . Arthritis   . Migraine   .  Unspecified hereditary and idiopathic peripheral neuropathy   . Migraine   . Other and unspecified hyperlipidemia   . Other malaise and fatigue   . Herpes zoster without mention of complication   . Cervicalgia   . CLUSTER HEADACHE 02/10/2007    Qualifier: Diagnosis of  By: Linna Darner MD, Gwyndolyn Saxon      Past Surgical History  Procedure Laterality Date  . Angioplasty      w/ 2 stents in 1999  . Tonsillectomy    . Appendectomy    . Hemorrhoid surgery    . Lipoma excision      Social History   Social History  . Marital Status: Married    Spouse Name: [name]  . Number of Children: 2  . Years of Education: college   Occupational History  .  retired     worked as a Engineer, water  .     Social History Main Topics  . Smoking status: Never Smoker   . Smokeless tobacco: Never Used  . Alcohol Use: 0.0 oz/week    0 Standard drinks or equivalent per week     Comment: 2 drink before dinner vodka   . Drug Use: No  . Sexual Activity: Not Asked   Other Topics Concern  . None   Social History Narrative   Pt is married and lives with wife (suzanne). Patient finished  two years of college.    Pt has children.   Caffeine -two cups daily.   Left handed.   Retired Licensed conveyancer                     Family History  Problem Relation Age of Onset  . Stroke Father     Died, 83  . High blood pressure Father   . Diabetes Father   . Diabetes Brother     Died, 82  . Diabetes Sister     Living, 11  . Other Mother     Died, 88  . Healthy Daughter   . Healthy Son     Review of Systems  Constitutional: Negative for fever.  HENT: Negative for nosebleeds.   Respiratory: Negative for cough, shortness of breath and wheezing.   Cardiovascular: Positive for leg swelling (occasional). Negative for chest pain and palpitations.  Gastrointestinal: Negative for abdominal pain and blood in stool.       No gerd  Genitourinary: Negative for hematuria.  Neurological: Positive for light-headedness (mild, occasional). Negative for dizziness and headaches.       Objective:   Filed Vitals:   04/14/16 1350  BP: 136/66  Pulse: 53  Temp: 98.1 F (36.7 C)  Resp: 16   Filed Weights   04/14/16 1350  Weight: 185 lb (83.915 kg)   Body mass index is 30.79 kg/(m^2).   Physical Exam Constitutional: Appears well-developed and well-nourished. No distress.  Neck: Neck supple. No tracheal deviation present. No thyromegaly present.  No carotid bruit. No cervical adenopathy.   Cardiovascular: Normal rate, regular rhythm and normal heart sounds.   3/6 systolic murmur heard.  No edema Pulmonary/Chest: Effort normal and breath sounds  normal. No respiratory distress. No wheezes.  Skin: several bruises on bilateral arms and legs      Assessment & Plan:   See Problem List for Assessment and Plan of chronic medical problems.

## 2016-04-14 ENCOUNTER — Other Ambulatory Visit: Payer: Medicare Other

## 2016-04-14 ENCOUNTER — Encounter: Payer: Self-pay | Admitting: Internal Medicine

## 2016-04-14 ENCOUNTER — Ambulatory Visit (INDEPENDENT_AMBULATORY_CARE_PROVIDER_SITE_OTHER): Payer: Medicare Other | Admitting: Internal Medicine

## 2016-04-14 ENCOUNTER — Other Ambulatory Visit (INDEPENDENT_AMBULATORY_CARE_PROVIDER_SITE_OTHER): Payer: Medicare Other

## 2016-04-14 VITALS — BP 136/66 | HR 53 | Temp 98.1°F | Resp 16 | Wt 185.0 lb

## 2016-04-14 DIAGNOSIS — I2510 Atherosclerotic heart disease of native coronary artery without angina pectoris: Secondary | ICD-10-CM

## 2016-04-14 DIAGNOSIS — D696 Thrombocytopenia, unspecified: Secondary | ICD-10-CM | POA: Diagnosis not present

## 2016-04-14 DIAGNOSIS — E785 Hyperlipidemia, unspecified: Secondary | ICD-10-CM | POA: Diagnosis not present

## 2016-04-14 DIAGNOSIS — G609 Hereditary and idiopathic neuropathy, unspecified: Secondary | ICD-10-CM

## 2016-04-14 DIAGNOSIS — I10 Essential (primary) hypertension: Secondary | ICD-10-CM | POA: Diagnosis not present

## 2016-04-14 DIAGNOSIS — M109 Gout, unspecified: Secondary | ICD-10-CM

## 2016-04-14 LAB — CBC WITH DIFFERENTIAL/PLATELET
Basophils Absolute: 0 10*3/uL (ref 0.0–0.1)
Basophils Relative: 0.7 % (ref 0.0–3.0)
EOS PCT: 1.7 % (ref 0.0–5.0)
Eosinophils Absolute: 0.1 10*3/uL (ref 0.0–0.7)
HEMATOCRIT: 40.2 % (ref 39.0–52.0)
Hemoglobin: 13.6 g/dL (ref 13.0–17.0)
LYMPHS ABS: 1.2 10*3/uL (ref 0.7–4.0)
LYMPHS PCT: 25.5 % (ref 12.0–46.0)
MCHC: 33.7 g/dL (ref 30.0–36.0)
MCV: 97.2 fl (ref 78.0–100.0)
MONOS PCT: 11.9 % (ref 3.0–12.0)
Monocytes Absolute: 0.5 10*3/uL (ref 0.1–1.0)
NEUTROS ABS: 2.8 10*3/uL (ref 1.4–7.7)
NEUTROS PCT: 60.2 % (ref 43.0–77.0)
PLATELETS: 111 10*3/uL — AB (ref 150.0–400.0)
RBC: 4.14 Mil/uL — ABNORMAL LOW (ref 4.22–5.81)
RDW: 14.2 % (ref 11.5–15.5)
WBC: 4.6 10*3/uL (ref 4.0–10.5)

## 2016-04-14 LAB — COMPREHENSIVE METABOLIC PANEL
ALT: 19 U/L (ref 0–53)
AST: 20 U/L (ref 0–37)
Albumin: 4 g/dL (ref 3.5–5.2)
Alkaline Phosphatase: 26 U/L — ABNORMAL LOW (ref 39–117)
BUN: 24 mg/dL — ABNORMAL HIGH (ref 6–23)
CALCIUM: 9 mg/dL (ref 8.4–10.5)
CO2: 27 meq/L (ref 19–32)
Chloride: 109 mEq/L (ref 96–112)
Creatinine, Ser: 1.14 mg/dL (ref 0.40–1.50)
GFR: 64.67 mL/min (ref >60.00)
GLUCOSE: 99 mg/dL (ref 70–99)
POTASSIUM: 4.5 meq/L (ref 3.5–5.1)
Sodium: 140 mEq/L (ref 135–145)
TOTAL PROTEIN: 6 g/dL (ref 6.0–8.3)
Total Bilirubin: 1.2 mg/dL (ref 0.2–1.2)

## 2016-04-14 NOTE — Assessment & Plan Note (Signed)
Taking gabapentin 600 mg twice daily Follows with neurology

## 2016-04-14 NOTE — Assessment & Plan Note (Signed)
Taking pravastatin 40 mg daily Lipid panel well controlled Continue above

## 2016-04-14 NOTE — Assessment & Plan Note (Signed)
attack in over 2 years Taking allopurinol 300 mg daily

## 2016-04-14 NOTE — Assessment & Plan Note (Signed)
BP well controlled Current regimen effective and well tolerated Continue current medications at current doses CMP 

## 2016-04-14 NOTE — Patient Instructions (Addendum)
  Test(s) ordered today. Your results will be released to MyChart (or called to you) after review, usually within 72hours after test completion. If any changes need to be made, you will be notified at that same time.  Medications reviewed and updated.  No changes recommended at this time.    Please followup in 6 months   

## 2016-04-14 NOTE — Progress Notes (Signed)
Pre visit review using our clinic review tool, if applicable. No additional management support is needed unless otherwise documented below in the visit note. 

## 2016-04-14 NOTE — Assessment & Plan Note (Signed)
Chronic No prior hematology evaluation Bruising on arms and legs Check CBC Consider hematology referral,? Medication induced-allopurinol?

## 2016-04-16 ENCOUNTER — Encounter: Payer: Self-pay | Admitting: Internal Medicine

## 2016-05-20 DIAGNOSIS — Z85828 Personal history of other malignant neoplasm of skin: Secondary | ICD-10-CM | POA: Diagnosis not present

## 2016-05-20 DIAGNOSIS — L821 Other seborrheic keratosis: Secondary | ICD-10-CM | POA: Diagnosis not present

## 2016-05-20 DIAGNOSIS — L918 Other hypertrophic disorders of the skin: Secondary | ICD-10-CM | POA: Diagnosis not present

## 2016-05-28 ENCOUNTER — Ambulatory Visit: Payer: Medicare Other | Admitting: Family Medicine

## 2016-06-05 DIAGNOSIS — M25511 Pain in right shoulder: Secondary | ICD-10-CM | POA: Diagnosis not present

## 2016-06-05 DIAGNOSIS — M25512 Pain in left shoulder: Secondary | ICD-10-CM | POA: Diagnosis not present

## 2016-06-05 DIAGNOSIS — M1A09X0 Idiopathic chronic gout, multiple sites, without tophus (tophi): Secondary | ICD-10-CM | POA: Diagnosis not present

## 2016-06-05 DIAGNOSIS — M150 Primary generalized (osteo)arthritis: Secondary | ICD-10-CM | POA: Diagnosis not present

## 2016-06-05 DIAGNOSIS — N183 Chronic kidney disease, stage 3 (moderate): Secondary | ICD-10-CM | POA: Diagnosis not present

## 2016-06-23 ENCOUNTER — Ambulatory Visit: Payer: Medicare Other | Admitting: Cardiovascular Disease

## 2016-07-08 DIAGNOSIS — Z23 Encounter for immunization: Secondary | ICD-10-CM | POA: Diagnosis not present

## 2016-07-10 DIAGNOSIS — M19012 Primary osteoarthritis, left shoulder: Secondary | ICD-10-CM | POA: Diagnosis not present

## 2016-07-10 DIAGNOSIS — M19011 Primary osteoarthritis, right shoulder: Secondary | ICD-10-CM | POA: Diagnosis not present

## 2016-08-01 ENCOUNTER — Telehealth: Payer: Self-pay | Admitting: Neurology

## 2016-08-01 NOTE — Telephone Encounter (Signed)
Will you please schedule him for a follow up?  Thanks.

## 2016-08-01 NOTE — Telephone Encounter (Signed)
Pt called and hasn't been seen since 2015.  He stated this is a new problem and it is with his neck.  May be a pinched nerve.  He had surgery on his neck many years ago for a ruptured disk.  He thought of seeing a neurologist and Dr. Posey Pronto he wants to see you.  I explained that this may not be something Dr. Posey Pronto would see/treat him for.  He still wanted a note to go back and ask.  If Dr. Posey Pronto doesn't see him for this problem, then he would like her to recommend a doctor.  Please call back today.

## 2016-08-01 NOTE — Telephone Encounter (Signed)
OK to schedule follow-up visit.

## 2016-08-01 NOTE — Telephone Encounter (Signed)
Please advise 

## 2016-08-06 ENCOUNTER — Encounter: Payer: Self-pay | Admitting: Neurology

## 2016-08-06 ENCOUNTER — Ambulatory Visit (INDEPENDENT_AMBULATORY_CARE_PROVIDER_SITE_OTHER): Payer: Medicare Other | Admitting: Neurology

## 2016-08-06 VITALS — BP 118/60 | HR 60 | Ht 65.0 in | Wt 185.4 lb

## 2016-08-06 DIAGNOSIS — I2510 Atherosclerotic heart disease of native coronary artery without angina pectoris: Secondary | ICD-10-CM

## 2016-08-06 DIAGNOSIS — G621 Alcoholic polyneuropathy: Secondary | ICD-10-CM | POA: Diagnosis not present

## 2016-08-06 DIAGNOSIS — M2550 Pain in unspecified joint: Secondary | ICD-10-CM

## 2016-08-06 NOTE — Progress Notes (Signed)
Follow-up Visit   Date: 08/06/16    Paul Mays MRN: [id number] DOB: 02/15/1928   Interim History: Paul Mays is a delightful 82 y.o. left-handed Caucasian male with history of small SAH s/p fall in 03/2009, vertigo, hyperlipidemia, hypertension, and CAD s/p stent (1999) returning to the clinic for follow-up peripheral neuropathy (diagnosed 1990s).    History of present illness: Patient has long history of small fiber neuropathy affecting the soles of feet since the in 1990s and was being managed by Dr. Erling Cruz (last seen in 2012). Pain is described as "bees stinging and walking on hot coal", constant but intermittently will worsen. He takes neurontin 600mg  five times daily, which significantly improves the pain, but it will only last 3 hours. He does not take the Neurontin on any particular schedule, and seems to take it as needed (11am, 3pm, 8pm, 4AM). He was seen recently by Dr. Krista Blue for neuropathy and was recently started on Trileptal but there was no improvement, so he stopped it. He has previously tried Cymbalta, Lyrica, and Trileptal without any significant relief. No prior EMG has been done.  Of note, his daughter is a retired Garment/textile technologist.  UPDATE 04/18/2014:  He has been doing well, with no worsening of neuropathy.  No interval falls, hospitalizations, or falls. He continues to take neurontin 600mg  as needed throughout the day which works.  UPDATE 08/06/2016:  He presents today with new complaints of bilateral shoulder pain, achy arm pain, and neck pain.  It is worse with arm extension such as when washing or brushing his hair. He has known arthritis of the shoulder and neck and has seen Dr. Noemi Chapel who recommended prednisone 5mg , but this did not provide any relief. He has been taking Aleve for pain which helps, but is frustrated that nothing controls is pain.  He has previously had cortisone injection by Dr. Amil Amen to the shoulder which helped some.  He had prior cervical surgery  about 40+ years ago and is concern that his pain may be stemming from nerve impingement.  He does not have shooting, electrical pain, numbness/tingling or weakness of the arms.  His neuropathy is well controlled on gabapentin 600mg  BID.  He walks independently and has not suffered any falls.  He continues to drink a few glasses of vodka nightly.   Medications:  Current Outpatient Prescriptions on File Prior to Visit  Medication Sig Dispense Refill  . allopurinol (ZYLOPRIM) 300 MG tablet Take 300 mg by mouth daily as needed (gout flare).     . Cholecalciferol (VITAMIN D3) 2000 UNITS capsule Take 1 capsule (2,000 Units total) by mouth daily.    . Flavocoxid (LIMBREL) 500 MG CAPS Take 500 mg by mouth daily.    Marland Kitchen gabapentin (NEURONTIN) 600 MG tablet Take 1 tablet (600 mg total) by mouth 2 (two) times daily. 60 tablet 0  . losartan (COZAAR) 50 MG tablet Take 1 tablet (50 mg total) by mouth daily. 90 tablet 1  . metoprolol (LOPRESSOR) 50 MG tablet 1/2 tab by mouth two times a day.     . pravastatin (PRAVACHOL) 40 MG tablet Take 40 mg by mouth daily.       No current facility-administered medications on file prior to visit.     Allergies:  Allergies  Allergen Reactions  . Lisinopril Other (See Comments)    Hyperkalemia in the context of combined spironolactone and lisinopril therapy  . Spironolactone Other (See Comments)    Hyperkalemia in context of spironolactone and lisinopril  therapy     Review of Systems:  CONSTITUTIONAL: No fevers, chills, night sweats, or weight loss.   EYES: No visual changes or eye pain ENT: No hearing changes.  No history of nose bleeds.   RESPIRATORY: No cough, wheezing and shortness of breath.   CARDIOVASCULAR: Negative for chest pain, and palpitations.   GI: Negative for abdominal discomfort, blood in stools or black stools.  No recent change in bowel habits.   GU:  No history of incontinence.   MUSCLOSKELETAL: +history of joint pain or swelling.  No  myalgias.   SKIN: Negative for lesions, rash, and itching.   ENDOCRINE: Negative for cold or heat intolerance, polydipsia or goiter.   PSYCH:  no depression or anxiety symptoms.   NEURO: As Above.   Vital Signs:  BP 118/60   Pulse 60   Ht 5\' 5"  (1.651 m)   Wt 185 lb 7 oz (84.1 kg)   SpO2 97%   BMI 30.86 kg/m   Neurological Exam: MENTAL STATUS including orientation to time, place, person, recent and remote memory, attention span and concentration, language, and fund of knowledge is normal.  Speech is not dysarthric.  CRANIAL NERVES:  No visual field defects.  Pupils equal round and reactive to light.  Normal conjugate, extra-ocular eye movements in all directions of gaze.  No nystagmus.   Face is symmetric.  Palate elevates symmetrically.  Tongue is midline.  MOTOR:  Motor strength is 5/5 throughout. No pronator drift.  Tone is normal.   There is a lipoma over the cervical region which is slightly tender.  MSRs:  Right                                                                 Left brachioradialis 2+  brachioradialis 2+  biceps 2+  biceps 2+  triceps 2+  triceps 2+  patellar 1+  patellar 1+  ankle jerk 0  ankle jerk 0  Hoffman no  Hoffman no  plantar response down  plantar response down   SENSORY:  Absent vibration distal to the knees bilaterally.   COORDINATION/GAIT:  Gait narrow based and stable.    Data: 08/09/2013 QTc 400 Labs 07/19/2013:  Vitamin B12 286, TSH 1.93, GGT 25, ceruloplasmin 30, copper 120, MMA 0.18, SPEP/UPEP - no M protein   IMPRESSION: 1.  Bilateral proximal arm achy pain, most suggestive of arthritis.  No features to his history or exam to suggest this is stemming from cervical nerve impingement.  He is being followed by Dr. Amil Amen for polyarthralgia and will request medication options through their office.    2.  Peripheral neuropathy due to chronic alcohol consumption (3 mixed drinks with vodka for many years).  Clinically stable on gabapentin  600mg  BID Previously tried medications include:  Lyrica, Cymbalta, and Trileptal.  Return to clinic as needed  The duration of this appointment visit was 25 minutes of face-to-face time with the patient.  Greater than 50% of this time was spent in counseling, explanation of diagnosis, planning of further management, and coordination of care.   Thank you for allowing me to participate in patient's care.  If I can answer any additional questions, I would be pleased to do so.    Sincerely,    Sommer Spickard K.  Posey Pronto, DO

## 2016-08-06 NOTE — Patient Instructions (Signed)
Your pain seems to be stemming from shoulder arthritis.  I do not see any evidence that this is due to a pinched nerve in the neck region.  Follow-up with your rheumatologist for further recommendations.

## 2016-08-13 DIAGNOSIS — N183 Chronic kidney disease, stage 3 (moderate): Secondary | ICD-10-CM | POA: Diagnosis not present

## 2016-08-13 DIAGNOSIS — M1A09X0 Idiopathic chronic gout, multiple sites, without tophus (tophi): Secondary | ICD-10-CM | POA: Diagnosis not present

## 2016-08-13 DIAGNOSIS — M25511 Pain in right shoulder: Secondary | ICD-10-CM | POA: Diagnosis not present

## 2016-08-13 DIAGNOSIS — M150 Primary generalized (osteo)arthritis: Secondary | ICD-10-CM | POA: Diagnosis not present

## 2016-08-13 DIAGNOSIS — M25512 Pain in left shoulder: Secondary | ICD-10-CM | POA: Diagnosis not present

## 2016-10-01 ENCOUNTER — Other Ambulatory Visit: Payer: Self-pay | Admitting: Internal Medicine

## 2016-10-01 DIAGNOSIS — I10 Essential (primary) hypertension: Secondary | ICD-10-CM

## 2016-10-15 ENCOUNTER — Other Ambulatory Visit (INDEPENDENT_AMBULATORY_CARE_PROVIDER_SITE_OTHER): Payer: Medicare Other

## 2016-10-15 ENCOUNTER — Encounter: Payer: Self-pay | Admitting: Internal Medicine

## 2016-10-15 ENCOUNTER — Ambulatory Visit (INDEPENDENT_AMBULATORY_CARE_PROVIDER_SITE_OTHER): Payer: Medicare Other | Admitting: Internal Medicine

## 2016-10-15 VITALS — BP 152/68 | HR 64 | Temp 97.4°F | Resp 16 | Wt 191.0 lb

## 2016-10-15 DIAGNOSIS — I10 Essential (primary) hypertension: Secondary | ICD-10-CM | POA: Diagnosis not present

## 2016-10-15 DIAGNOSIS — E7800 Pure hypercholesterolemia, unspecified: Secondary | ICD-10-CM

## 2016-10-15 DIAGNOSIS — D696 Thrombocytopenia, unspecified: Secondary | ICD-10-CM | POA: Diagnosis not present

## 2016-10-15 DIAGNOSIS — M109 Gout, unspecified: Secondary | ICD-10-CM

## 2016-10-15 DIAGNOSIS — G609 Hereditary and idiopathic neuropathy, unspecified: Secondary | ICD-10-CM | POA: Diagnosis not present

## 2016-10-15 DIAGNOSIS — R600 Localized edema: Secondary | ICD-10-CM

## 2016-10-15 LAB — COMPREHENSIVE METABOLIC PANEL WITH GFR
ALT: 16 U/L (ref 0–53)
AST: 19 U/L (ref 0–37)
Albumin: 3.8 g/dL (ref 3.5–5.2)
Alkaline Phosphatase: 26 U/L — ABNORMAL LOW (ref 39–117)
BUN: 19 mg/dL (ref 6–23)
CO2: 29 meq/L (ref 19–32)
Calcium: 9 mg/dL (ref 8.4–10.5)
Chloride: 108 meq/L (ref 96–112)
Creatinine, Ser: 1.05 mg/dL (ref 0.40–1.50)
GFR: 71.03 mL/min (ref >60.00)
Glucose, Bld: 110 mg/dL — ABNORMAL HIGH (ref 70–99)
Potassium: 4.6 meq/L (ref 3.5–5.1)
Sodium: 143 meq/L (ref 135–145)
Total Bilirubin: 1.3 mg/dL — ABNORMAL HIGH (ref 0.2–1.2)
Total Protein: 6 g/dL (ref 6.0–8.3)

## 2016-10-15 LAB — CBC WITH DIFFERENTIAL/PLATELET
Basophils Absolute: 0 K/uL (ref 0.0–0.1)
Basophils Relative: 0.7 % (ref 0.0–3.0)
Eosinophils Absolute: 0.1 K/uL (ref 0.0–0.7)
Eosinophils Relative: 2.2 % (ref 0.0–5.0)
HCT: 37.5 % — ABNORMAL LOW (ref 39.0–52.0)
Hemoglobin: 12.8 g/dL — ABNORMAL LOW (ref 13.0–17.0)
Lymphocytes Relative: 22 % (ref 12.0–46.0)
Lymphs Abs: 1.1 K/uL (ref 0.7–4.0)
MCHC: 34.2 g/dL (ref 30.0–36.0)
MCV: 99.3 fl (ref 78.0–100.0)
Monocytes Absolute: 0.6 K/uL (ref 0.1–1.0)
Monocytes Relative: 12.8 % — ABNORMAL HIGH (ref 3.0–12.0)
Neutro Abs: 3 K/uL (ref 1.4–7.7)
Neutrophils Relative %: 62.3 % (ref 43.0–77.0)
Platelets: 124 K/uL — ABNORMAL LOW (ref 150.0–400.0)
RBC: 3.77 Mil/uL — ABNORMAL LOW (ref 4.22–5.81)
RDW: 14.7 % (ref 11.5–15.5)
WBC: 4.8 K/uL (ref 4.0–10.5)

## 2016-10-15 NOTE — Assessment & Plan Note (Signed)
BP Readings from Last 3 Encounters:  10/15/16 (!) 152/68  08/06/16 118/60  04/14/16 136/66   BP elevated today - has been better controlled Continue current medication encouraged to monitor at home

## 2016-10-15 NOTE — Assessment & Plan Note (Signed)
cbc

## 2016-10-15 NOTE — Assessment & Plan Note (Signed)
Continue daily statin Regular exercise and healthy diet encouraged  

## 2016-10-15 NOTE — Assessment & Plan Note (Signed)
Stable, controlled continue gabapentin at current dose

## 2016-10-15 NOTE — Patient Instructions (Addendum)

## 2016-10-15 NOTE — Assessment & Plan Note (Signed)
No gout flares in years Continue daily allopurinol

## 2016-10-15 NOTE — Assessment & Plan Note (Signed)
Has mild edema Does not bother him Will hold off on diuretics

## 2016-10-15 NOTE — Progress Notes (Signed)
Pre visit review using our clinic review tool, if applicable. No additional management support is needed unless otherwise documented below in the visit note. 

## 2016-10-15 NOTE — Progress Notes (Signed)
Subjective:    Patient ID: Paul Mays, male    DOB: [birth year], 83 y.o.   MRN: [id number]  HPI The patient is here for follow up.  CAD, Hypertension, HCM, mild AS: He is taking his medication daily. He is compliant with a low sodium diet.  He denies chest pain, palpitations,, shortness of breath and regular headaches. He walks his dog for exercise.  He does not monitor his blood pressure at home.    Hyperlipidemia: He is taking his medication daily. He is compliant with a low fat/cholesterol diet. He is exercising regularly. He denies myalgias.   Joint pain:  He sees Dr Amil Amen.  He takes aleve for his joint pain.  He has chronic left shoulder pain.    Peripheral neuropathy due to chronic alcohol consumption (3 mixed drinks with vodka for many years:  He has seen Dr Posey Pronto for this.  He is taking gabapentin 600 mg TID, which controls this pain.   H/o gout:  He is taking the allopurinol daily.  He uses colchicine as needed.   His last gout flare was three years ago.    Medications and allergies reviewed with patient and updated if appropriate.  Patient Active Problem List   Diagnosis Date Noted  . Rotator cuff tear arthropathy of both shoulders 10/18/2015  . Thrombocytopenia ([place]) 09/24/2015  . Diverticulosis of colon without hemorrhage 07/02/2014  . Hypertrophic cardiomyopathy ([place]) 03/24/2011  . OBSTRUCTIVE SLEEP APNEA 11/08/2010  . HYPERTENSION, PULMONARY 09/16/2010  . Hyperuricemia 11/28/2009  . ANXIETY 11/09/2009  . HEARING LOSS, BILATERAL 06/18/2009  . Coronary atherosclerosis 03/07/2009  . Aortic valve disorder 03/07/2009  . CAROTID STENOSIS 03/07/2009  . Hyperlipidemia 11/15/2008  . Peripheral neuropathy, idiopathic 11/15/2008  . Essential hypertension 11/15/2008  . ARTHRALGIA 11/15/2008  . COLITIS, HX OF 11/15/2008  . OLECRANON BURSITIS, RIGHT 09/14/2007  . VENOUS INSUFFICIENCY 04/20/2007  . EDEMA 04/20/2007  . GOUT 02/10/2007  . PSTPRC STATUS, PTCA 02/10/2007      Current Outpatient Prescriptions on File Prior to Visit  Medication Sig Dispense Refill  . allopurinol (ZYLOPRIM) 300 MG tablet Take 300 mg by mouth daily as needed (gout flare).     . Cholecalciferol (VITAMIN D3) 2000 UNITS capsule Take 1 capsule (2,000 Units total) by mouth daily.    . Flavocoxid (LIMBREL) 500 MG CAPS Take 500 mg by mouth daily.    Marland Kitchen gabapentin (NEURONTIN) 600 MG tablet Take 1 tablet (600 mg total) by mouth 2 (two) times daily. 60 tablet 0  . losartan (COZAAR) 50 MG tablet take 1 tablet by mouth once daily 90 tablet 0  . metoprolol (LOPRESSOR) 50 MG tablet 1/2 tab by mouth two times a day.     . pravastatin (PRAVACHOL) 40 MG tablet Take 40 mg by mouth daily.       No current facility-administered medications on file prior to visit.     Past Medical History:  Diagnosis Date  . Anxiety   . Aortic stenosis   . Arthralgia   . Arthritis   . CAD (coronary artery disease)   . Carotid stenosis   . Cervicalgia   . CLUSTER HEADACHE 02/10/2007   Qualifier: Diagnosis of  By: Linna Darner MD, Gwyndolyn Saxon    . Colitis   . Edema   . Gout   . Hearing loss   . Herpes zoster without mention of complication   . Hyperlipidemia   . Hypertension   . Hyperuricemia   . Impacted cerumen   . Migraine   .  Migraine   . Murmur   . Olecranon bursitis   . OSA (obstructive sleep apnea)   . Other and unspecified hyperlipidemia   . Other malaise and fatigue   . Peripheral neuropathy ([place])   . Polyneuropathy ([place])   . Pulmonary hypertension   . Unspecified hereditary and idiopathic peripheral neuropathy   . Venous insufficiency     Past Surgical History:  Procedure Laterality Date  . ANGIOPLASTY     w/ 2 stents in 1999  . APPENDECTOMY    . HEMORRHOID SURGERY    . LIPOMA EXCISION    . TONSILLECTOMY      Social History   Social History  . Marital status: Married    Spouse name: [name]  . Number of children: 2  . Years of education: college   Occupational History  . retired      worked as a Engineer, water  .  Retired   Social History Main Topics  . Smoking status: Never Smoker  . Smokeless tobacco: Never Used  . Alcohol use 0.0 oz/week     Comment: 2 drink before dinner vodka   . Drug use: No  . Sexual activity: Not Asked   Other Topics Concern  . None   Social History Narrative   Pt is married and lives with wife (suzanne). Patient finished  two years of college.    Pt has children.   Caffeine -two cups daily.   Left handed.   Retired Licensed conveyancer                     Family History  Problem Relation Age of Onset  . Stroke Father     Died, 83  . High blood pressure Father   . Diabetes Father   . Other Mother     Died, 88  . Diabetes Brother     Died, 82  . Diabetes Sister     Living, 89  . Healthy Daughter   . Healthy Son     Review of Systems  Constitutional: Negative for fever.  Respiratory: Negative for cough, shortness of breath and wheezing.   Cardiovascular: Positive for leg swelling. Negative for chest pain and palpitations.  Neurological: Negative for light-headedness and headaches.       Objective:   Vitals:   10/15/16 1323  BP: (!) 152/68  Pulse: 64  Resp: 16  Temp: 97.4 F (36.3 C)   Wt Readings from Last 3 Encounters:  10/15/16 191 lb (86.6 kg)  08/06/16 185 lb 7 oz (84.1 kg)  04/14/16 185 lb (83.9 kg)   Body mass index is 31.78 kg/m.   Physical Exam    Constitutional: Appears well-developed and well-nourished. No distress.  HENT:  Head: Normocephalic and atraumatic.  Neck: Neck supple. No tracheal deviation present. No thyromegaly present.  No cervical lymphadenopathy Cardiovascular: Normal rate, regular rhythm and normal heart sounds.   2/6 systolic murmur heard. No carotid bruit .  1+ edema b/l LE R > L Pulmonary/Chest: Effort normal and breath sounds normal. No respiratory distress. No has no wheezes. No rales.  Skin: Skin is warm and dry. Not diaphoretic.  Psychiatric: Normal mood and  affect. Behavior is normal.      Assessment & Plan:    See Problem List for Assessment and Plan of chronic medical problems.

## 2016-10-16 ENCOUNTER — Encounter: Payer: Self-pay | Admitting: Internal Medicine

## 2016-11-14 DIAGNOSIS — M19012 Primary osteoarthritis, left shoulder: Secondary | ICD-10-CM | POA: Diagnosis not present

## 2016-12-02 ENCOUNTER — Ambulatory Visit (INDEPENDENT_AMBULATORY_CARE_PROVIDER_SITE_OTHER): Payer: Medicare Other | Admitting: Internal Medicine

## 2016-12-02 ENCOUNTER — Encounter: Payer: Self-pay | Admitting: Internal Medicine

## 2016-12-02 VITALS — BP 140/74 | HR 52 | Temp 98.2°F | Resp 16 | Wt 184.0 lb

## 2016-12-02 DIAGNOSIS — B9789 Other viral agents as the cause of diseases classified elsewhere: Secondary | ICD-10-CM

## 2016-12-02 DIAGNOSIS — J069 Acute upper respiratory infection, unspecified: Secondary | ICD-10-CM | POA: Diagnosis not present

## 2016-12-02 NOTE — Patient Instructions (Signed)
You likely have a viral infection.    If your symptoms worsen or fail to improve, please contact our office for further instruction, or in case of emergency go directly to the emergency room at the closest medical facility.   General Recommendations:    Please drink plenty of fluids.  Get plenty of rest   Sleep in humidified air  Use saline nasal sprays  Netti pot  OTC Medications:  Decongestants - helps relieve congestion   Flonase (generic fluticasone) or Nasacort (generic triamcinolone) - please make sure to use the "cross-over" technique at a 45 degree angle towards the opposite eye as opposed to straight up the nasal passageway.   Sudafed (generic pseudoephedrine - Note this is the one that is available behind the pharmacy counter); Products with phenylephrine (-PE) may also be used but is often not as effective as pseudoephedrine.   If you have HIGH BLOOD PRESSURE - Coricidin HBP; AVOID any product that is -D as this contains pseudoephedrine which may increase your blood pressure.  Afrin (oxymetazoline) every 6-8 hours for up to 3 days.  Allergies - helps relieve runny nose, itchy eyes and sneezing   Claritin (generic loratidine), Allegra (fexofenidine), or Zyrtec (generic cyrterizine) for runny nose. These medications should not cause drowsiness.  Note - Benadryl (generic diphenhydramine) may be used however may cause drowsiness  Cough -   Delsym or Robitussin (generic dextromethorphan)  Expectorants - helps loosen mucus to ease removal   Mucinex (generic guaifenesin) as directed on the package.  Headaches / General Aches   Tylenol (generic acetaminophen) - DO NOT EXCEED 3 grams (3,000 mg) in a 24 hour time period  Advil/Motrin (generic ibuprofen)  Sore Throat -   Salt water gargle   Chloraseptic (generic benzocaine) spray or lozenges / Sucrets (generic dyclonine)

## 2016-12-02 NOTE — Assessment & Plan Note (Signed)
Likely viral, symptoms better today No antibiotic needed Symptomatic treatment only Call if symptoms do not continue to improve

## 2016-12-02 NOTE — Progress Notes (Signed)
Subjective:    Patient ID: Paul Mays, male    DOB: 1928-08-09, 83 y.o.   MRN: [id number]  HPI He is here for an acute visit for cold symptoms.  His symptoms started 5 days ago.    He is experiencing fatigue, feels feverish at times but denies fever, lightheaded,  congestion in his chest, cough and mild body aches.   He has brought some phlegm up and it was not discolored.  He actually feels better today.     He has not tried taking anything for his symptoms.    Medications and allergies reviewed with patient and updated if appropriate.  Patient Active Problem List   Diagnosis Date Noted  . Rotator cuff tear arthropathy of both shoulders 10/18/2015  . Thrombocytopenia ([place]) 09/24/2015  . Diverticulosis of colon without hemorrhage 07/02/2014  . Hypertrophic cardiomyopathy ([place]) 03/24/2011  . OBSTRUCTIVE SLEEP APNEA 11/08/2010  . HYPERTENSION, PULMONARY 09/16/2010  . Hyperuricemia 11/28/2009  . HEARING LOSS, BILATERAL 06/18/2009  . Coronary atherosclerosis 03/07/2009  . Aortic valve disorder 03/07/2009  . CAROTID STENOSIS 03/07/2009  . Hyperlipidemia 11/15/2008  . Peripheral neuropathy, idiopathic 11/15/2008  . Essential hypertension 11/15/2008  . ARTHRALGIA 11/15/2008  . COLITIS, HX OF 11/15/2008  . OLECRANON BURSITIS, RIGHT 09/14/2007  . VENOUS INSUFFICIENCY 04/20/2007  . Edema 04/20/2007  . Gout 02/10/2007  . PSTPRC STATUS, PTCA 02/10/2007    Current Outpatient Prescriptions on File Prior to Visit  Medication Sig Dispense Refill  . allopurinol (ZYLOPRIM) 300 MG tablet Take 300 mg by mouth daily as needed (gout flare).     . Cholecalciferol (VITAMIN D3) 2000 UNITS capsule Take 1 capsule (2,000 Units total) by mouth daily.    . Flavocoxid (LIMBREL) 500 MG CAPS Take 500 mg by mouth daily.    Marland Kitchen gabapentin (NEURONTIN) 600 MG tablet Take 1 tablet (600 mg total) by mouth 2 (two) times daily. 60 tablet 0  . losartan (COZAAR) 50 MG tablet take 1 tablet by mouth once  daily 90 tablet 0  . metoprolol (LOPRESSOR) 50 MG tablet 1/2 tab by mouth two times a day.     . pravastatin (PRAVACHOL) 40 MG tablet Take 40 mg by mouth daily.       No current facility-administered medications on file prior to visit.     Past Medical History:  Diagnosis Date  . Anxiety   . Aortic stenosis   . Arthralgia   . Arthritis   . CAD (coronary artery disease)   . Carotid stenosis   . Cervicalgia   . CLUSTER HEADACHE 02/10/2007   Qualifier: Diagnosis of  By: Linna Darner MD, Gwyndolyn Saxon    . Colitis   . Edema   . Gout   . Hearing loss   . Herpes zoster without mention of complication   . Hyperlipidemia   . Hypertension   . Hyperuricemia   . Impacted cerumen   . Migraine   . Migraine   . Murmur   . Olecranon bursitis   . OSA (obstructive sleep apnea)   . Other and unspecified hyperlipidemia   . Other malaise and fatigue   . Peripheral neuropathy ([place])   . Polyneuropathy ([place])   . Pulmonary hypertension   . Unspecified hereditary and idiopathic peripheral neuropathy   . Venous insufficiency     Past Surgical History:  Procedure Laterality Date  . ANGIOPLASTY     w/ 2 stents in 1999  . APPENDECTOMY    . HEMORRHOID SURGERY    .  LIPOMA EXCISION    . TONSILLECTOMY      Social History   Social History  . Marital status: Married    Spouse name: [name]  . Number of children: 2  . Years of education: college   Occupational History  . retired     worked as a Engineer, water  .  Retired   Social History Main Topics  . Smoking status: Never Smoker  . Smokeless tobacco: Never Used  . Alcohol use 0.0 oz/week     Comment: 2 drink before dinner vodka   . Drug use: No  . Sexual activity: Not on file   Other Topics Concern  . Not on file   Social History Narrative   Pt is married and lives with wife (suzanne). Patient finished  two years of college.    Pt has children.   Caffeine -two cups daily.   Left handed.   Retired Licensed conveyancer                      Family History  Problem Relation Age of Onset  . Stroke Father     Died, 83  . High blood pressure Father   . Diabetes Father   . Other Mother     Died, 88  . Diabetes Brother     Died, 82  . Diabetes Sister     Living, 37  . Healthy Daughter   . Healthy Son     Review of Systems  Constitutional: Negative for appetite change and fever.  HENT: Negative for congestion, ear pain, sinus pain, sinus pressure and sore throat.   Respiratory: Positive for cough (productive at times - not discolored - mostly when laying down). Negative for shortness of breath and wheezing.   Cardiovascular: Negative for chest pain.  Gastrointestinal: Negative for diarrhea and nausea.  Musculoskeletal: Positive for myalgias.  Neurological: Positive for light-headedness. Negative for headaches.       Objective:   Vitals:   12/02/16 1602  BP: 140/74  Pulse: (!) 52  Resp: 16  Temp: 98.2 F (36.8 C)   Filed Weights   12/02/16 1602  Weight: 184 lb (83.5 kg)   Body mass index is 30.62 kg/m.  Wt Readings from Last 3 Encounters:  12/02/16 184 lb (83.5 kg)  10/15/16 191 lb (86.6 kg)  08/06/16 185 lb 7 oz (84.1 kg)     Physical Exam GENERAL APPEARANCE: Appears stated age, well appearing, NAD EYES: conjunctiva clear, no icterus HEENT: bilateral tympanic membranes and ear canals normal, oropharynx with mild erythema, no thyromegaly, trachea midline, no cervical or supraclavicular lymphadenopathy LUNGS: Clear to auscultation without wheeze or crackles, unlabored breathing, good air entry bilaterally HEART: Normal [id number], 3/6 systolic murmurs EXTREMITIES: Without clubbing, cyanosis, or edema        Assessment & Plan:   See Problem List for Assessment and Plan of chronic medical problems.

## 2016-12-02 NOTE — Progress Notes (Signed)
Pre visit review using our clinic review tool, if applicable. No additional management support is needed unless otherwise documented below in the visit note. 

## 2016-12-08 ENCOUNTER — Other Ambulatory Visit: Payer: Self-pay | Admitting: Internal Medicine

## 2016-12-08 NOTE — Telephone Encounter (Signed)
Please advise 

## 2016-12-08 NOTE — Telephone Encounter (Signed)
Antibiotic pending.  No pharmacy on file.   Please confirm pof and send.

## 2016-12-08 NOTE — Telephone Encounter (Signed)
Pt called and said that his symptoms have not better and was told to call back. Please call pt. Thanks

## 2016-12-10 DIAGNOSIS — H35371 Puckering of macula, right eye: Secondary | ICD-10-CM | POA: Diagnosis not present

## 2016-12-10 DIAGNOSIS — H52203 Unspecified astigmatism, bilateral: Secondary | ICD-10-CM | POA: Diagnosis not present

## 2016-12-10 MED ORDER — CEPHALEXIN 500 MG PO CAPS: 500.0000 mg | ORAL_CAPSULE | Freq: Three times a day (TID) | ORAL | 0 refills | Status: DC

## 2016-12-10 NOTE — Telephone Encounter (Signed)
LVM for pt to call back to verify which pharm he would like antibiotic sent to.

## 2016-12-10 NOTE — Telephone Encounter (Signed)
RX sent

## 2016-12-10 NOTE — Telephone Encounter (Signed)
Patient states he uses [facility] on [address].

## 2016-12-11 DIAGNOSIS — M25512 Pain in left shoulder: Secondary | ICD-10-CM | POA: Diagnosis not present

## 2016-12-11 DIAGNOSIS — Z6831 Body mass index (BMI) 31.0-31.9, adult: Secondary | ICD-10-CM | POA: Diagnosis not present

## 2016-12-11 DIAGNOSIS — E669 Obesity, unspecified: Secondary | ICD-10-CM | POA: Diagnosis not present

## 2016-12-11 DIAGNOSIS — M1A09X0 Idiopathic chronic gout, multiple sites, without tophus (tophi): Secondary | ICD-10-CM | POA: Diagnosis not present

## 2016-12-11 DIAGNOSIS — N183 Chronic kidney disease, stage 3 (moderate): Secondary | ICD-10-CM | POA: Diagnosis not present

## 2016-12-11 DIAGNOSIS — M150 Primary generalized (osteo)arthritis: Secondary | ICD-10-CM | POA: Diagnosis not present

## 2016-12-25 ENCOUNTER — Telehealth: Payer: Self-pay | Admitting: *Deleted

## 2016-12-25 NOTE — Telephone Encounter (Signed)
Ok to fill 

## 2016-12-25 NOTE — Telephone Encounter (Signed)
Pt left msg on triage stating he normally get his Gabapentin through the [facility] [place] they are behind on getting medication requesting MD to call in a 30 day script to [facility]. Pls advice if ok...Paul Mays

## 2016-12-26 ENCOUNTER — Other Ambulatory Visit: Payer: Self-pay | Admitting: Internal Medicine

## 2016-12-26 DIAGNOSIS — I10 Essential (primary) hypertension: Secondary | ICD-10-CM

## 2016-12-26 MED ORDER — GABAPENTIN 600 MG PO TABS: 600.0000 mg | ORAL_TABLET | Freq: Two times a day (BID) | ORAL | 0 refills | Status: DC

## 2016-12-26 NOTE — Telephone Encounter (Signed)
Notified pt refill sent to [facility]...Paul Mays

## 2017-01-06 DIAGNOSIS — L821 Other seborrheic keratosis: Secondary | ICD-10-CM | POA: Diagnosis not present

## 2017-01-06 DIAGNOSIS — Z85828 Personal history of other malignant neoplasm of skin: Secondary | ICD-10-CM | POA: Diagnosis not present

## 2017-01-13 ENCOUNTER — Telehealth: Payer: Self-pay | Admitting: Emergency Medicine

## 2017-01-13 NOTE — Telephone Encounter (Signed)
LVM with [facility], requesting a call back to inquire whether pt needs a TB skin test for admission to facility.

## 2017-01-15 DIAGNOSIS — Z0289 Encounter for other administrative examinations: Secondary | ICD-10-CM

## 2017-01-15 NOTE — Telephone Encounter (Signed)
Received voicemail from [facility]. Pt nor his wife will need TB skin test bc they will be moving to independent living. Forms have been faxed and mailed a copy to son for records.

## 2017-01-22 ENCOUNTER — Telehealth: Payer: Self-pay | Admitting: Internal Medicine

## 2017-01-22 MED ORDER — ALLOPURINOL 300 MG PO TABS: 300.0000 mg | ORAL_TABLET | Freq: Every day | ORAL | 0 refills | Status: DC | PRN

## 2017-01-22 NOTE — Telephone Encounter (Signed)
Spoke with pt to clarify message. RX sent to [facility]

## 2017-01-22 NOTE — Telephone Encounter (Signed)
Pt recieves allopurinol (ZYLOPRIM) 300 MG tablet  From [facility], they are having issues filling it. If its possible would like a Rx put in for 30 days.

## 2017-02-09 ENCOUNTER — Telehealth: Payer: Self-pay | Admitting: Internal Medicine

## 2017-02-09 NOTE — Telephone Encounter (Signed)
Please call him - this med is not on his list.  Was he taking this for his prostate or blood pressure.  Why does he want to restart it

## 2017-02-09 NOTE — Telephone Encounter (Signed)
Pt called requesting a prescription be sent in for Terazosin 10 mg to [facility].

## 2017-02-09 NOTE — Telephone Encounter (Signed)
Medication is not on med list pls advise../lmb 

## 2017-02-10 MED ORDER — TERAZOSIN HCL 10 MG PO CAPS: 10.0000 mg | ORAL_CAPSULE | Freq: Every day | ORAL | 5 refills | Status: DC

## 2017-02-10 NOTE — Telephone Encounter (Signed)
Ok to send - just confirm dose - two different doses in old med list.

## 2017-02-10 NOTE — Telephone Encounter (Signed)
Called pt he states he's been taking the Terazosin every night. Med was rx by the [place], but they can no longer get medication...Paul Mays

## 2017-02-10 NOTE — Telephone Encounter (Signed)
Called pt verified msg again inform will send rx electronically to [facility]...Paul Mays

## 2017-02-12 ENCOUNTER — Telehealth: Payer: Self-pay | Admitting: Internal Medicine

## 2017-02-12 NOTE — Telephone Encounter (Signed)
Pt was interested in nitroglycerin patches for his shoulder.   Would like Rx for this if he can  [facility] on [address]

## 2017-02-12 NOTE — Telephone Encounter (Signed)
Has been over a year so patient must see Dr. Tamala Julian to figure out a plan for left shoulder pain. Spoke with patient and scheduled him with Dr. Tamala Julian.

## 2017-02-13 DIAGNOSIS — Z85828 Personal history of other malignant neoplasm of skin: Secondary | ICD-10-CM | POA: Diagnosis not present

## 2017-02-13 DIAGNOSIS — D692 Other nonthrombocytopenic purpura: Secondary | ICD-10-CM | POA: Diagnosis not present

## 2017-02-13 DIAGNOSIS — L570 Actinic keratosis: Secondary | ICD-10-CM | POA: Diagnosis not present

## 2017-02-13 DIAGNOSIS — L820 Inflamed seborrheic keratosis: Secondary | ICD-10-CM | POA: Diagnosis not present

## 2017-02-13 DIAGNOSIS — L821 Other seborrheic keratosis: Secondary | ICD-10-CM | POA: Diagnosis not present

## 2017-02-26 ENCOUNTER — Ambulatory Visit: Payer: Self-pay

## 2017-02-26 ENCOUNTER — Ambulatory Visit (INDEPENDENT_AMBULATORY_CARE_PROVIDER_SITE_OTHER): Payer: Medicare Other | Admitting: Family Medicine

## 2017-02-26 ENCOUNTER — Encounter: Payer: Self-pay | Admitting: Family Medicine

## 2017-02-26 ENCOUNTER — Ambulatory Visit: Payer: Medicare Other

## 2017-02-26 VITALS — BP 130/66 | HR 64 | Wt 186.0 lb

## 2017-02-26 DIAGNOSIS — G8929 Other chronic pain: Secondary | ICD-10-CM

## 2017-02-26 DIAGNOSIS — M75102 Unspecified rotator cuff tear or rupture of left shoulder, not specified as traumatic: Secondary | ICD-10-CM

## 2017-02-26 DIAGNOSIS — M12811 Other specific arthropathies, not elsewhere classified, right shoulder: Secondary | ICD-10-CM | POA: Diagnosis not present

## 2017-02-26 DIAGNOSIS — M12812 Other specific arthropathies, not elsewhere classified, left shoulder: Secondary | ICD-10-CM | POA: Diagnosis not present

## 2017-02-26 DIAGNOSIS — M25512 Pain in left shoulder: Principal | ICD-10-CM

## 2017-02-26 DIAGNOSIS — M75101 Unspecified rotator cuff tear or rupture of right shoulder, not specified as traumatic: Secondary | ICD-10-CM

## 2017-02-26 NOTE — Assessment & Plan Note (Signed)
Patient does have significant arthritic changes of the shoulders bilaterally. We discussed icing regimen and home exercises. Patient will do topical anti-inflammatories. Patient is already taking gabapentin. Patient given trial topical anti-inflammatories today even. Patient will do vitamin D for muscle strength and endurance. We can repeat injections every 3-4 months if needed.

## 2017-02-26 NOTE — Progress Notes (Signed)
[name] [address] [place], [place] [address] Phone: [phone] Subjective:    I'm seeing this patient by the request  of:    CC: Bilateral shoulder pain  DUK:[id number]  Paul Mays is a 83 y.o. male coming in with complaint of bilateral shoulder pain. Patient was seen by me a year ago and was diagnosed with rotator cuff arthropathy. Patient was given an injection nearly 14 months ago with fairly good resolution of pain. Patient states  Worsening pain recently. Patient has been packing boxes and moving. Patient states came wake him up at night. Affecting some daily activities. Patient denies any radiation down the arms. Continues to try to be active.      Past Medical History:  Diagnosis Date  . Anxiety   . Aortic stenosis   . Arthralgia   . Arthritis   . CAD (coronary artery disease)   . Carotid stenosis   . Cervicalgia   . CLUSTER HEADACHE 02/10/2007   Qualifier: Diagnosis of  By: Linna Darner MD, Gwyndolyn Saxon    . Colitis   . Edema   . Gout   . Hearing loss   . Herpes zoster without mention of complication   . Hyperlipidemia   . Hypertension   . Hyperuricemia   . Impacted cerumen   . Migraine   . Migraine   . Murmur   . Olecranon bursitis   . OSA (obstructive sleep apnea)   . Other and unspecified hyperlipidemia   . Other malaise and fatigue   . Peripheral neuropathy   . Polyneuropathy   . Pulmonary hypertension ([place])   . Unspecified hereditary and idiopathic peripheral neuropathy   . Venous insufficiency    Past Surgical History:  Procedure Laterality Date  . ANGIOPLASTY     w/ 2 stents in 1999  . APPENDECTOMY    . HEMORRHOID SURGERY    . LIPOMA EXCISION    . TONSILLECTOMY     Social History   Social History  . Marital status: Married    Spouse name: [name]  . Number of children: 2  . Years of education: college   Occupational History  . retired     worked as a Engineer, water  .  Retired   Social History  Main Topics  . Smoking status: Never Smoker  . Smokeless tobacco: Never Used  . Alcohol use 0.0 oz/week     Comment: 2 drink before dinner vodka   . Drug use: No  . Sexual activity: Not Asked   Other Topics Concern  . None   Social History Narrative   Pt is married and lives with wife (suzanne). Patient finished  two years of college.    Pt has children.   Caffeine -two cups daily.   Left handed.   Retired Licensed conveyancer                    Allergies  Allergen Reactions  . Lisinopril Other (See Comments)    Hyperkalemia in the context of combined spironolactone and lisinopril therapy  . Spironolactone Other (See Comments)    Hyperkalemia in context of spironolactone and lisinopril therapy   Family History  Problem Relation Age of Onset  . Stroke Father        Died, 83  . High blood pressure Father   . Diabetes Father   . Other Mother        Died, 88  . Diabetes Brother  Died, 82  . Diabetes Sister        Living, 45  . Healthy Daughter   . Healthy Son     Past medical history, social, surgical and family history all reviewed in electronic medical record.  No pertanent information unless stated regarding to the chief complaint.   Review of Systems:Review of systems updated and as accurate as of 02/26/17  No headache, visual changes, nausea, vomiting, diarrhea, constipation, dizziness, abdominal pain, skin rash, fevers, chills, night sweats, weight loss, swollen lymph nodes,  joint swelling,  chest pain, shortness of breath, mood changes. Positive muscle aches and body aches  Objective  Blood pressure 130/66, pulse 64, weight 186 lb (84.4 kg). Systems examined below as of 02/26/17   General: No apparent distress alert and oriented x3 mood and affect normal, dressed appropriately.  HEENT: Pupils equal, extraocular movements intact  Respiratory: Patient's speak in full sentences and does not appear short of breath  Cardiovascular: No lower extremity edema, non  tender, no erythema  Skin: Warm dry intact with no signs of infection or rash on extremities or on axial skeleton.  Abdomen: Soft nontender  Neuro: Cranial nerves II through XII are intact, neurovascularly intact in all extremities with 2+ DTRs and 2+ pulses.  Lymph: No lymphadenopathy of posterior or anterior cervical chain or axillae bilaterally.  Gait Mild antalgic gait MSK:  Non tender with full range of motion and good stability and symmetric strength and tone of  elbows, wrist, hip, knee and ankles bilaterally. Significant arthritic changes of multiple joints  Shoulder: Bilateral Inspection reveals atrophy of the musculature bilaterally Palpation is normal with no tenderness over AC joint or bicipital groove. Mild limitation in range of motion lacking the last 5-10 of extension 3+ out of 5 strength but symmetric Positive impingement Speeds and Yergason's tests normal. Positive O'Brien's Normal scapular function observed. No painful arc and no drop arm sign. No apprehension sign    Procedure: Real-time Ultrasound Guided Injection of right glenohumeral joint Device: GE Logiq Q7  Ultrasound guided injection is preferred based studies that show increased duration, increased effect, greater accuracy, decreased procedural pain, increased response rate with ultrasound guided versus blind injection.  Verbal informed consent obtained.  Time-out conducted.  Noted no overlying erythema, induration, or other signs of local infection.  Skin prepped in a sterile fashion.  Local anesthesia: Topical Ethyl chloride.  With sterile technique and under real time ultrasound guidance:  Joint visualized.  23g 1  inch needle inserted posterior approach. Pictures taken for needle placement. Patient did have injection of 2 cc of 1% lidocaine, 2 cc of 0.5% Marcaine, and 1.0 cc of Kenalog 40 mg/dL. Completed without difficulty  Pain immediately resolved suggesting accurate placement of the medication.    Advised to call if fevers/chills, erythema, induration, drainage, or persistent bleeding.  Images permanently stored and available for review in the ultrasound unit.  Impression: Technically successful ultrasound guided injection.  Procedure: Real-time Ultrasound Guided Injection of left glenohumeral joint Device: GE Logiq E  Ultrasound guided injection is preferred based studies that show increased duration, increased effect, greater accuracy, decreased procedural pain, increased response rate with ultrasound guided versus blind injection.  Verbal informed consent obtained.  Time-out conducted.  Noted no overlying erythema, induration, or other signs of local infection.  Skin prepped in a sterile fashion.  Local anesthesia: Topical Ethyl chloride.  With sterile technique and under real time ultrasound guidance:  Joint visualized.  21g 2 inch needle inserted posterior approach. Pictures  taken for needle placement. Patient did have injection of 2 cc of 0.5% Marcaine, and 1cc of Kenalog 40 mg/dL. Completed without difficulty  Pain immediately resolved suggesting accurate placement of the medication.  Advised to call if fevers/chills, erythema, induration, drainage, or persistent bleeding.  Images permanently stored and available for review in the ultrasound unit.  Impression: Technically successful ultrasound guided injection.   Impression and Recommendations:     This case required medical decision making of moderate complexity.      Note: This dictation was prepared with Dragon dictation along with smaller phrase technology. Any transcriptional errors that result from this process are unintentional.

## 2017-02-26 NOTE — Patient Instructions (Signed)
Good to see you.  Ice 20 minutes 2 times daily. Usually after activity and before bed. pennsaid pinkie amount topically 2 times daily as needed.  Injected both shoulders again today We can repeat every 3 months as needed.  Continue vitamin D 2000 IU daily for strength and endurance.  See me again in 3 month s

## 2017-04-14 ENCOUNTER — Encounter: Payer: Medicare Other | Admitting: Internal Medicine

## 2017-04-14 NOTE — Progress Notes (Signed)
Subjective:    Patient ID: Paul Mays, male    DOB: 11-11-1927, 83 y.o.   MRN: [id number]  HPI No show  Medications and allergies reviewed with patient and updated if appropriate.  Patient Active Problem List   Diagnosis Date Noted  . Viral URI with cough 12/02/2016  . Rotator cuff tear arthropathy of both shoulders 10/18/2015  . Thrombocytopenia ([place]) 09/24/2015  . Diverticulosis of colon without hemorrhage 07/02/2014  . Hypertrophic cardiomyopathy ([place]) 03/24/2011  . OBSTRUCTIVE SLEEP APNEA 11/08/2010  . HYPERTENSION, PULMONARY 09/16/2010  . Hyperuricemia 11/28/2009  . HEARING LOSS, BILATERAL 06/18/2009  . Coronary atherosclerosis 03/07/2009  . Aortic valve disorder 03/07/2009  . CAROTID STENOSIS 03/07/2009  . Hyperlipidemia 11/15/2008  . Peripheral neuropathy, idiopathic 11/15/2008  . Essential hypertension 11/15/2008  . ARTHRALGIA 11/15/2008  . COLITIS, HX OF 11/15/2008  . OLECRANON BURSITIS, RIGHT 09/14/2007  . VENOUS INSUFFICIENCY 04/20/2007  . Edema 04/20/2007  . Gout 02/10/2007  . PSTPRC STATUS, PTCA 02/10/2007    Current Outpatient Prescriptions on File Prior to Visit  Medication Sig Dispense Refill  . allopurinol (ZYLOPRIM) 300 MG tablet Take 1 tablet (300 mg total) by mouth daily as needed (gout flare). 30 tablet 0  . Cholecalciferol (VITAMIN D3) 2000 UNITS capsule Take 1 capsule (2,000 Units total) by mouth daily.    . Flavocoxid (LIMBREL) 500 MG CAPS Take 500 mg by mouth daily.    Marland Kitchen gabapentin (NEURONTIN) 600 MG tablet Take 1 tablet (600 mg total) by mouth 2 (two) times daily. 60 tablet 0  . losartan (COZAAR) 50 MG tablet take 1 tablet by mouth once daily 90 tablet 2  . metoprolol (LOPRESSOR) 50 MG tablet 1/2 tab by mouth two times a day.     . pravastatin (PRAVACHOL) 40 MG tablet Take 40 mg by mouth daily.      Marland Kitchen terazosin (HYTRIN) 10 MG capsule Take 1 capsule (10 mg total) by mouth at bedtime. 30 capsule 5   No current facility-administered  medications on file prior to visit.     Past Medical History:  Diagnosis Date  . Anxiety   . Aortic stenosis   . Arthralgia   . Arthritis   . CAD (coronary artery disease)   . Carotid stenosis   . Cervicalgia   . CLUSTER HEADACHE 02/10/2007   Qualifier: Diagnosis of  By: Linna Darner MD, Gwyndolyn Saxon    . Colitis   . Edema   . Gout   . Hearing loss   . Herpes zoster without mention of complication   . Hyperlipidemia   . Hypertension   . Hyperuricemia   . Impacted cerumen   . Migraine   . Migraine   . Murmur   . Olecranon bursitis   . OSA (obstructive sleep apnea)   . Other and unspecified hyperlipidemia   . Other malaise and fatigue   . Peripheral neuropathy   . Polyneuropathy   . Pulmonary hypertension ([place])   . Unspecified hereditary and idiopathic peripheral neuropathy   . Venous insufficiency     Past Surgical History:  Procedure Laterality Date  . ANGIOPLASTY     w/ 2 stents in 1999  . APPENDECTOMY    . HEMORRHOID SURGERY    . LIPOMA EXCISION    . TONSILLECTOMY      Social History   Social History  . Marital status: Married    Spouse name: [name]  . Number of children: 2  . Years of education: college   Occupational History  .  retired     worked as a Engineer, water  .  Retired   Social History Main Topics  . Smoking status: Never Smoker  . Smokeless tobacco: Never Used  . Alcohol use 0.0 oz/week     Comment: 2 drink before dinner vodka   . Drug use: No  . Sexual activity: Not on file   Other Topics Concern  . Not on file   Social History Narrative   Pt is married and lives with wife (suzanne). Patient finished  two years of college.    Pt has children.   Caffeine -two cups daily.   Left handed.   Retired Licensed conveyancer                     Family History  Problem Relation Age of Onset  . Stroke Father        Died, 83  . High blood pressure Father   . Diabetes Father   . Other Mother        Died, 88  . Diabetes Brother         Died, 82  . Diabetes Sister        Living, 41  . Healthy Daughter   . Healthy Son     Review of Systems     Objective:  There were no vitals filed for this visit. Wt Readings from Last 3 Encounters:  02/26/17 186 lb (84.4 kg)  12/02/16 184 lb (83.5 kg)  10/15/16 191 lb (86.6 kg)   There is no height or weight on file to calculate BMI.   Physical Exam          Assessment & Plan:    See Problem List for Assessment and Plan of chronic medical problems.    This encounter was created in error - please disregard.

## 2017-04-28 ENCOUNTER — Encounter: Payer: Self-pay | Admitting: Cardiovascular Disease

## 2017-04-28 ENCOUNTER — Ambulatory Visit (INDEPENDENT_AMBULATORY_CARE_PROVIDER_SITE_OTHER): Payer: Medicare Other | Admitting: Cardiovascular Disease

## 2017-04-28 VITALS — BP 160/70 | HR 53 | Ht 65.0 in | Wt 183.0 lb

## 2017-04-28 DIAGNOSIS — I10 Essential (primary) hypertension: Secondary | ICD-10-CM

## 2017-04-28 DIAGNOSIS — I359 Nonrheumatic aortic valve disorder, unspecified: Secondary | ICD-10-CM

## 2017-04-28 MED ORDER — LOSARTAN POTASSIUM 100 MG PO TABS: 100.0000 mg | ORAL_TABLET | Freq: Every day | ORAL | 3 refills | Status: DC

## 2017-04-28 NOTE — Progress Notes (Signed)
Cardiology Office Note Date:  04/30/2017   ID:  MAHKAI FANGMAN, DOB Oct 20, 1927, MRN [id number]  PCP:  Binnie Rail, MD  Cardiologist:  Sherren Mocha, MD    Chief Complaint  Patient presents with  . Follow-up     History of Present Illness: Paul Mays is a 83 y.o. male who presents for follow-up evaluation. The patient has coronary artery disease with history of remote PCI. He also has hypertension and LVH with septal hypertrophy and LV outflow tract obstruction. Last echo showed stable findings with hyperdynamic LV function, LVEF 65-70%, LV outflow tract obstruction with peak and mean gradients of 25 and 14 mmHg, respectively. There was aortic valve thickening with minimal restriction of leaflet motion. There was systolic anterior motion of the mitral valve. Estimated PA pressure was 51 mmHg.  He is here alone today. He and his wife Collie Siad have transitioned to [place]. He feels well and has not cardiac-related complaints today. Today, he denies symptoms of palpitations, chest pain, shortness of breath, orthopnea, PND, lower extremity edema, dizziness, or syncope.  Past Medical History:  Diagnosis Date  . Anxiety   . Aortic stenosis   . Arthralgia   . Arthritis   . CAD (coronary artery disease)   . Carotid stenosis   . Cervicalgia   . CLUSTER HEADACHE 02/10/2007   Qualifier: Diagnosis of  By: Linna Darner MD, Gwyndolyn Saxon    . Colitis   . Edema   . Gout   . Hearing loss   . Herpes zoster without mention of complication   . Hyperlipidemia   . Hypertension   . Hyperuricemia   . Impacted cerumen   . Migraine   . Migraine   . Murmur   . Olecranon bursitis   . OSA (obstructive sleep apnea)   . Other and unspecified hyperlipidemia   . Other malaise and fatigue   . Peripheral neuropathy   . Polyneuropathy   . Pulmonary hypertension ([place])   . Unspecified hereditary and idiopathic peripheral neuropathy   . Venous insufficiency     Past Surgical History:  Procedure Laterality  Date  . ANGIOPLASTY     w/ 2 stents in 1999  . APPENDECTOMY    . HEMORRHOID SURGERY    . LIPOMA EXCISION    . TONSILLECTOMY      Current Outpatient Prescriptions  Medication Sig Dispense Refill  . allopurinol (ZYLOPRIM) 300 MG tablet Take 1 tablet (300 mg total) by mouth daily as needed (gout flare). 30 tablet 0  . Cholecalciferol (VITAMIN D3) 2000 UNITS capsule Take 1 capsule (2,000 Units total) by mouth daily.    . Flavocoxid (LIMBREL) 500 MG CAPS Take 500 mg by mouth daily.    Marland Kitchen gabapentin (NEURONTIN) 600 MG tablet Take 1 tablet (600 mg total) by mouth 2 (two) times daily. 60 tablet 0  . losartan (COZAAR) 100 MG tablet Take 1 tablet (100 mg total) by mouth daily. 90 tablet 3  . metoprolol (LOPRESSOR) 50 MG tablet Take 25 mg by mouth 2 (two) times daily. 1/2 tab by mouth two times a day.     . pravastatin (PRAVACHOL) 40 MG tablet Take 40 mg by mouth daily.      Marland Kitchen terazosin (HYTRIN) 10 MG capsule Take 1 capsule (10 mg total) by mouth at bedtime. 30 capsule 5   No current facility-administered medications for this visit.     Allergies:   Lisinopril and Spironolactone   Social History:  The patient  reports that he has  never smoked. He has never used smokeless tobacco. He reports that he drinks alcohol. He reports that he does not use drugs.   Family History:  The patient's  family history includes Diabetes in his brother, father, and sister; Healthy in his daughter and son; High blood pressure in his father; Other in his mother; Stroke in his father.    ROS:  Please see the history of present illness.   All other systems are reviewed and negative.    PHYSICAL EXAM: VS:  BP (!) 160/70   Pulse (!) 53   Ht 5\' 5"  (1.651 m)   Wt 183 lb (83 kg)   BMI 30.45 kg/m  , BMI Body mass index is 30.45 kg/m. GEN: Well nourished, well developed, pleasant elderly male in no acute distress  HEENT: normal  Neck: no JVD, no masses. No carotid bruits Cardiac: RRR with 2/6 SEM at the RUSB              Respiratory:  clear to auscultation bilaterally, normal work of breathing GI: soft, nontender, nondistended, + BS MS: no deformity or atrophy  Ext: no pretibial edema, pedal pulses 2+= bilaterally Skin: warm and dry, no rash Neuro:  Strength and sensation are intact Psych: euthymic mood, full affect  EKG:  EKG is ordered today. The ekg ordered today shows sinus bradycardia 53 bpm, nonspecific ST abnormality  Recent Labs: 10/15/2016: ALT 16; BUN 19; Creatinine, Ser 1.05; Hemoglobin 12.8; Platelets 124.0; Potassium 4.6; Sodium 143   Lipid Panel     Component Value Date/Time   CHOL 157 09/24/2015 1108   TRIG 58.0 09/24/2015 1108   HDL 68.90 09/24/2015 1108   CHOLHDL 2 09/24/2015 1108   VLDL 11.6 09/24/2015 1108   LDLCALC 76 09/24/2015 1108      Wt Readings from Last 3 Encounters:  04/28/17 183 lb (83 kg)  02/26/17 186 lb (84.4 kg)  12/02/16 184 lb (83.5 kg)    ASSESSMENT AND PLAN: 1.  HTN, uncontrolled. Repeat BP on my recheck is 170/74. Medications reviewed - advised increase losartan to 100 mg daily.   2. Senile hypertrophic cardiomyopathy. Pt is asymptomatic. Last echo reviewed.   3. Aortic stenosis: mild. Likely repeat echo in 1-2 years.  4. Hyperlipidemia: treated with pravastatin. Last lipids reviewed with HDL 69 and LDL 76.   Current medicines are reviewed with the patient today.  The patient does not have concerns regarding medicines.  Labs/ tests ordered today include:   Orders Placed This Encounter  Procedures  . EKG 12-Lead    Disposition:   FU one year  Signed, Sherren Mocha, MD  04/30/2017 5:38 AM    [facility] [address], [place], [place]  [address] Phone: [phone]; Fax: [phone]

## 2017-04-28 NOTE — Patient Instructions (Signed)
Medication Instructions:  Your physician has recommended you make the following change in your medication:  1. INCREASE Losartan to 100mg  take one tablet by mouth daily  Labwork: No new orders.   Testing/Procedures: No new orders.   Follow-Up: Your physician wants you to follow-up in: 1 YEAR with Dr Burt Knack. You will receive a reminder letter in the mail two months in advance. If you don't receive a letter, please call our office to schedule the follow-up appointment.   Any Other Special Instructions Will Be Listed Below (If Applicable).     If you need a refill on your cardiac medications before your next appointment, please call your pharmacy.

## 2017-05-05 ENCOUNTER — Telehealth: Payer: Self-pay | Admitting: Internal Medicine

## 2017-05-05 NOTE — Telephone Encounter (Signed)
Fee was waived.  Notified patient.

## 2017-05-05 NOTE — Telephone Encounter (Signed)
Pt called stating he received a bill for a $50 no show fee, he had a no show on 7/10 and does not recall an appt at that time. He states he was moving at that time and does not believe he should be charged for this. This is his first no show with [facility]. I told him I would send a message to you and we would give him a cal back.

## 2017-06-01 ENCOUNTER — Encounter: Payer: Self-pay | Admitting: Family Medicine

## 2017-06-01 ENCOUNTER — Ambulatory Visit: Payer: Self-pay

## 2017-06-01 ENCOUNTER — Ambulatory Visit (INDEPENDENT_AMBULATORY_CARE_PROVIDER_SITE_OTHER): Payer: Medicare Other | Admitting: Family Medicine

## 2017-06-01 VITALS — BP 110/62 | HR 96 | Ht 65.0 in | Wt 183.0 lb

## 2017-06-01 DIAGNOSIS — M25512 Pain in left shoulder: Principal | ICD-10-CM

## 2017-06-01 DIAGNOSIS — M12812 Other specific arthropathies, not elsewhere classified, left shoulder: Secondary | ICD-10-CM

## 2017-06-01 DIAGNOSIS — M12811 Other specific arthropathies, not elsewhere classified, right shoulder: Secondary | ICD-10-CM | POA: Diagnosis not present

## 2017-06-01 DIAGNOSIS — G8929 Other chronic pain: Secondary | ICD-10-CM

## 2017-06-01 DIAGNOSIS — M75101 Unspecified rotator cuff tear or rupture of right shoulder, not specified as traumatic: Secondary | ICD-10-CM

## 2017-06-01 DIAGNOSIS — M75102 Unspecified rotator cuff tear or rupture of left shoulder, not specified as traumatic: Secondary | ICD-10-CM

## 2017-06-01 NOTE — Patient Instructions (Signed)
Good to see you  We could do the losartan still at 100mg  and check blood pressure daily.  If top number is below 130 then I would go back to 50 mg Continue the vitamins we have discussed Lets do these injections every 3 months.  See me again in 3 months.

## 2017-06-01 NOTE — Assessment & Plan Note (Signed)
Worsening symptoms. Bilateral injections given today. We discussed icing regimen and home exercises. We discussed the topical anti-inflammatories again. Patient has many other comorbidities so does limit some of her ways to help. Patient will follow-up with me again in 3 months.

## 2017-06-01 NOTE — Progress Notes (Signed)
[name] [place], [place] [address] Phone: [phone] Subjective:    I'm seeing this patient by the request  of:    CC: Bilateral shoulder pain  DXA:JOINOMVEHM  Paul Mays is a 83 y.o. male coming in with complaint of bilateral shoulder pain. Last injection was 3 months ago. Worsening symptoms at this time. Patient says that is affecting daily activities. Trying to do the home exercises intermittently. States though that he can even wake him up at night especially on the left sign     Past Medical History:  Diagnosis Date  . Anxiety   . Aortic stenosis   . Arthralgia   . Arthritis   . CAD (coronary artery disease)   . Carotid stenosis   . Cervicalgia   . CLUSTER HEADACHE 02/10/2007   Qualifier: Diagnosis of  By: Linna Darner MD, Gwyndolyn Saxon    . Colitis   . Edema   . Gout   . Hearing loss   . Herpes zoster without mention of complication   . Hyperlipidemia   . Hypertension   . Hyperuricemia   . Impacted cerumen   . Migraine   . Migraine   . Murmur   . Olecranon bursitis   . OSA (obstructive sleep apnea)   . Other and unspecified hyperlipidemia   . Other malaise and fatigue   . Peripheral neuropathy   . Polyneuropathy   . Pulmonary hypertension ([place])   . Unspecified hereditary and idiopathic peripheral neuropathy   . Venous insufficiency    Past Surgical History:  Procedure Laterality Date  . ANGIOPLASTY     w/ 2 stents in 1999  . APPENDECTOMY    . HEMORRHOID SURGERY    . LIPOMA EXCISION    . TONSILLECTOMY     Social History   Social History  . Marital status: Married    Spouse name: Paul Mays  . Number of children: 2  . Years of education: college   Occupational History  . retired     worked as a Engineer, water  .  Retired   Social History Main Topics  . Smoking status: Never Smoker  . Smokeless tobacco: Never Used  . Alcohol use 0.0 oz/week     Comment: 2 drink before dinner vodka   . Drug use: No    . Sexual activity: Not Asked   Other Topics Concern  . None   Social History Narrative   Pt is married and lives with wife (suzanne). Patient finished  two years of college.    Pt has children.   Caffeine -two cups daily.   Left handed.   Retired Licensed conveyancer                    Allergies  Allergen Reactions  . Lisinopril Other (See Comments)    Hyperkalemia in the context of combined spironolactone and lisinopril therapy  . Spironolactone Other (See Comments)    Hyperkalemia in context of spironolactone and lisinopril therapy   Family History  Problem Relation Age of Onset  . Stroke Father        Died, 83  . High blood pressure Father   . Diabetes Father   . Other Mother        Died, 88  . Diabetes Brother        Died, 82  . Diabetes Sister        Living, 7  . Healthy Daughter   . Healthy  Son     Past medical history, social, surgical and family history all reviewed in electronic medical record.  No pertanent information unless stated regarding to the chief complaint.   Review of Systems: No headache, visual changes, nausea, vomiting, diarrhea, constipation, dizziness, abdominal pain, skin rash, fevers, chills, night sweats, weight loss, swollen lymph nodes, body aches, joint swelling, chest pain, shortness of breath, mood changes.  Positive muscle aches  Objective  Blood pressure 110/62, pulse 96, height 5\' 5"  (1.651 m), weight 183 lb (83 kg), SpO2 94 %.   Systems examined below as of 06/01/17 General: NAD A&O x3 mood, affect normal  HEENT: Pupils equal, extraocular movements intact no nystagmus Respiratory: not short of breath at rest or with speaking Cardiovascular: No lower extremity edema, non tender Skin: Warm dry intact with no signs of infection or rash on extremities or on axial skeleton. Abdomen: Soft nontender, no masses Neuro: Cranial nerves  intact, neurovascularly intact in all extremities with 2+ DTRs and 2+ pulses. Lymph: No lymphadenopathy  appreciated today  Gait normal with good balance and coordination.  MSK: Non tender with full range of motion and good stability and symmetric strength and tone of  elbows, wrist,  knee hips and ankles bilaterally.  Significant arthritic changes of multiple joints  Shoulder: Bilateral Inspection reveals atrophy of the shoulder girdle bilaterally with increasing kyphosis of the upper thoracic spine Diffuse mild tenderness to palpation of the shoulder. Patient does have some mild limitation in passive range of motion with significant crepitus Rotator cuff strength 3+ out of 5 strength but symmetric Positive painful arc  No apprehension sign   Procedure: Real-time Ultrasound Guided Injection of right glenohumeral joint Device: GE Logiq Q7  Ultrasound guided injection is preferred based studies that show increased duration, increased effect, greater accuracy, decreased procedural pain, increased response rate with ultrasound guided versus blind injection.  Verbal informed consent obtained.  Time-out conducted.  Noted no overlying erythema, induration, or other signs of local infection.  Skin prepped in a sterile fashion.  Local anesthesia: Topical Ethyl chloride.  With sterile technique and under real time ultrasound guidance:  Joint visualized.  23g 1  inch needle inserted posterior approach. Pictures taken for needle placement. Patient did have injection of 2 cc of 1% lidocaine, 2 cc of 0.5% Marcaine, and 1.0 cc of Kenalog 40 mg/dL. Completed without difficulty  Pain immediately resolved suggesting accurate placement of the medication.  Advised to call if fevers/chills, erythema, induration, drainage, or persistent bleeding.  Images permanently stored and available for review in the ultrasound unit.  Impression: Technically successful ultrasound guided injection.  Procedure: Real-time Ultrasound Guided Injection of left glenohumeral joint Device: GE Logiq E  Ultrasound guided injection  is preferred based studies that show increased duration, increased effect, greater accuracy, decreased procedural pain, increased response rate with ultrasound guided versus blind injection.  Verbal informed consent obtained.  Time-out conducted.  Noted no overlying erythema, induration, or other signs of local infection.  Skin prepped in a sterile fashion.  Local anesthesia: Topical Ethyl chloride.  With sterile technique and under real time ultrasound guidance:  Joint visualized.  21g 2 inch needle inserted posterior approach. Pictures taken for needle placement. Patient did have injection of 2 cc of 0.5% Marcaine, and 1cc of Kenalog 40 mg/dL. Completed without difficulty  Pain immediately resolved suggesting accurate placement of the medication.  Advised to call if fevers/chills, erythema, induration, drainage, or persistent bleeding.  Images permanently stored and available for review in the  ultrasound unit.  Impression: Technically successful ultrasound guided injection.     Impression and Recommendations:     This case required medical decision making of moderate complexity.      Note: This dictation was prepared with Dragon dictation along with smaller phrase technology. Any transcriptional errors that result from this process are unintentional.

## 2017-07-04 DIAGNOSIS — Z23 Encounter for immunization: Secondary | ICD-10-CM | POA: Diagnosis not present

## 2017-08-30 NOTE — Progress Notes (Signed)
[name] [place],  [address] Phone: [phone] Subjective:     CC: Bilateral shoulder pain  GHW:[id number]  Paul Mays is a 83 y.o. male coming in with complaint of bilateral shoulder pain.  Found to have rotator cuff arthropathy.  Has had injections at 27-month intervals for quite some time.  Worsening symptoms.  Patient states that is waking him up at night.  Affecting daily activities.  Given dressing has become somewhat difficult.  Patient denies any numbness or tingling,     Past Medical History:  Diagnosis Date  . Anxiety   . Aortic stenosis   . Arthralgia   . Arthritis   . CAD (coronary artery disease)   . Carotid stenosis   . Cervicalgia   . CLUSTER HEADACHE 02/10/2007   Qualifier: Diagnosis of  By: Linna Darner MD, Gwyndolyn Saxon    . Colitis   . Edema   . Gout   . Hearing loss   . Herpes zoster without mention of complication   . Hyperlipidemia   . Hypertension   . Hyperuricemia   . Impacted cerumen   . Migraine   . Migraine   . Murmur   . Olecranon bursitis   . OSA (obstructive sleep apnea)   . Other and unspecified hyperlipidemia   . Other malaise and fatigue   . Peripheral neuropathy   . Polyneuropathy   . Pulmonary hypertension ([place])   . Unspecified hereditary and idiopathic peripheral neuropathy   . Venous insufficiency    Past Surgical History:  Procedure Laterality Date  . ANGIOPLASTY     w/ 2 stents in 1999  . APPENDECTOMY    . HEMORRHOID SURGERY    . LIPOMA EXCISION    . TONSILLECTOMY     Social History   Socioeconomic History  . Marital status: Married    Spouse name: [name]  . Number of children: 2  . Years of education: college  . Highest education level: Not on file  Social Needs  . Financial resource strain: Not on file  . Food insecurity - worry: Not on file  . Food insecurity - inability: Not on file  . Transportation needs - medical: Not on file  . Transportation needs -  non-medical: Not on file  Occupational History  . 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 Smoking status: Never Smoker  . Smokeless tobacco: Never Used  Substance and Sexual Activity  . Alcohol use: Yes    Alcohol/week: 0.0 oz    Comment: 2 drink before dinner vodka   . Drug use: No  . Sexual activity: Not on file  Other Topics Concern  . Not on file  Social History Narrative   Pt is married and lives with wife (suzanne). Patient finished  two years of college.    Pt has children.   Caffeine -two cups daily.   Left handed.   Retired Licensed conveyancer                    Allergies  Allergen Reactions  . Lisinopril Other (See Comments)    Hyperkalemia in the context of combined spironolactone and lisinopril therapy  . Spironolactone Other (See Comments)    Hyperkalemia in context of spironolactone and lisinopril therapy   Family History  Problem Relation Age of Onset  . Stroke Father        Died, 83  .  High blood pressure Father   . Diabetes Father   . Other Mother        Died, 88  . Diabetes Brother        Died, 82  . Diabetes Sister        Living, 24  . Healthy Daughter   . Healthy Son      Past medical history, social, surgical and family history all reviewed in electronic medical record.  No pertanent information unless stated regarding to the chief complaint.   Review of Systems:Review of systems updated and as accurate as of 08/30/17  No headache, visual changes, nausea, vomiting, diarrhea, constipation, dizziness, abdominal pain, skin rash, fevers, chills, night sweats, weight loss, swollen lymph nodes, body aches, joint swelling, , chest pain, shortness of breath, mood changes.  Positive muscle aches  Objective  There were no vitals taken for this visit. Systems examined below as of 08/30/17   General: No apparent distress alert and oriented x3 mood and affect normal, dressed appropriately.    HEENT: Pupils equal, extraocular movements intact  Respiratory: Patient's speak in full sentences and does not appear short of breath  Cardiovascular: No lower extremity edema, non tender, no erythema  Skin: Warm dry intact with no signs of infection or rash on extremities or on axial skeleton.  Abdomen: Soft nontender  Neuro: Cranial nerves II through XII are intact, neurovascularly intact in all extremities with 2+ DTRs and 2+ pulses.  Lymph: No lymphadenopathy of posterior or anterior cervical chain or axillae bilaterally.  Gait normal with good balance and coordination.  MSK: Mild tender with full range of motion and good stability and symmetric strength and tone of  elbows, wrist, hip, knee and ankles bilaterally.  Arthritic changes of multiple joints Shoulder: Bilateral Inspection reveals atrophy of the musculature Palpation is normal with no tenderness over AC joint or bicipital groove. ROM decreased in all planes with significant crepitus. Rotator cuff strength normal throughout. Severe impingement Speeds and Yergason's tests normal. Positive O'Brien's Normal scapular function observed. Positive painful arc and drop arm sign No apprehension sign  Procedure: Real-time Ultrasound Guided Injection of right glenohumeral joint Device: GE Logiq Q7  Ultrasound guided injection is preferred based studies that show increased duration, increased effect, greater accuracy, decreased procedural pain, increased response rate with ultrasound guided versus blind injection.  Verbal informed consent obtained.  Time-out conducted.  Noted no overlying erythema, induration, or other signs of local infection.  Skin prepped in a sterile fashion.  Local anesthesia: Topical Ethyl chloride.  With sterile technique and under real time ultrasound guidance:  Joint visualized.  23g 1  inch needle inserted posterior approach. Pictures taken for needle placement. Patient did have injection of 2 cc of 1%  lidocaine, 2 cc of 0.5% Marcaine, and 1.0 cc of Kenalog 40 mg/dL. Completed without difficulty  Pain immediately resolved suggesting accurate placement of the medication.  Advised to call if fevers/chills, erythema, induration, drainage, or persistent bleeding.  Images permanently stored and available for review in the ultrasound unit.  Impression: Technically successful ultrasound guided injection.  Procedure: Real-time Ultrasound Guided Injection of left glenohumeral joint Device: GE Logiq E  Ultrasound guided injection is preferred based studies that show increased duration, increased effect, greater accuracy, decreased procedural pain, increased response rate with ultrasound guided versus blind injection.  Verbal informed consent obtained.  Time-out conducted.  Noted no overlying erythema, induration, or other signs of local infection.  Skin prepped in a sterile fashion.  Local anesthesia: Topical  Ethyl chloride.  With sterile technique and under real time ultrasound guidance:  Joint visualized.  21g 2 inch needle inserted posterior approach. Pictures taken for needle placement. Patient did have injection of 2 cc of 0.5% Marcaine, and 1cc of Kenalog 40 mg/dL. Completed without difficulty  Pain immediately resolved suggesting accurate placement of the medication.  Advised to call if fevers/chills, erythema, induration, drainage, or persistent bleeding.  Images permanently stored and available for review in the ultrasound unit.  Impression: Technically successful ultrasound guided injection.      Impression and Recommendations:     This case required medical decision making of moderate complexity.      Note: This dictation was prepared with Dragon dictation along with smaller phrase technology. Any transcriptional errors that result from this process are unintentional.

## 2017-08-31 ENCOUNTER — Ambulatory Visit: Payer: Self-pay

## 2017-08-31 ENCOUNTER — Ambulatory Visit (INDEPENDENT_AMBULATORY_CARE_PROVIDER_SITE_OTHER): Payer: Medicare Other | Admitting: Family Medicine

## 2017-08-31 ENCOUNTER — Encounter: Payer: Self-pay | Admitting: Family Medicine

## 2017-08-31 VITALS — BP 150/64 | HR 50 | Ht 66.0 in | Wt 183.0 lb

## 2017-08-31 DIAGNOSIS — M25511 Pain in right shoulder: Secondary | ICD-10-CM

## 2017-08-31 DIAGNOSIS — G8929 Other chronic pain: Secondary | ICD-10-CM

## 2017-08-31 DIAGNOSIS — M75101 Unspecified rotator cuff tear or rupture of right shoulder, not specified as traumatic: Secondary | ICD-10-CM

## 2017-08-31 DIAGNOSIS — M12812 Other specific arthropathies, not elsewhere classified, left shoulder: Secondary | ICD-10-CM

## 2017-08-31 DIAGNOSIS — M12811 Other specific arthropathies, not elsewhere classified, right shoulder: Secondary | ICD-10-CM | POA: Diagnosis not present

## 2017-08-31 DIAGNOSIS — M25512 Pain in left shoulder: Secondary | ICD-10-CM

## 2017-08-31 DIAGNOSIS — M75102 Unspecified rotator cuff tear or rupture of left shoulder, not specified as traumatic: Secondary | ICD-10-CM

## 2017-08-31 NOTE — Assessment & Plan Note (Signed)
Bilateral injections given again today.  Has done very well with conservative therapy previously.  Hopefully will continue to have this done.  We discussed icing regimen and home exercise.  We discussed which activities to do which wants to avoid.  Patient will follow up with me again in 3 months

## 2017-08-31 NOTE — Patient Instructions (Signed)
Good to see you  Ice is your friend Stay active We will inject every 3 months See me again in 3 months

## 2017-09-03 NOTE — Progress Notes (Signed)
Subjective:    Patient ID: Paul Mays, male    DOB: 1928-02-11, 83 y.o.   MRN: [id number]  HPI The patient is here for follow up.  Right ear decreased hearing:  He is here today for decreased hearing in his right ear.   He denies pain or cold symptoms.  He has a history of wax accumulation.  He was unsure if he needed it to be cleaned out.  Hypertension: He is taking his medication daily. He is compliant with a low sodium diet.  He denies chest pain, palpitations, edema, shortness of breath and regular headaches. He is exercising some - walks dog 5 times a day.  He does not monitor his blood pressure at home.    Gout:  He is taking allopurinol daily as prescribed.  He denies any gout symptoms.    Peripheral neuropathy:  He is taking gabapentin twice daily.  He finds the medication effective.    Hyperlipidemia: He is taking his medication daily. He is compliant with a low fat/cholesterol diet. He is exercising some - walking dog 5 times a day. He denies myalgias.    Medications and allergies reviewed with patient and updated if appropriate.  Patient Active Problem List   Diagnosis Date Noted  . Decreased hearing of right ear 09/04/2017  . Rotator cuff tear arthropathy of both shoulders 10/18/2015  . Thrombocytopenia ([place]) 09/24/2015  . Diverticulosis of colon without hemorrhage 07/02/2014  . Hypertrophic cardiomyopathy ([place]) 03/24/2011  . OBSTRUCTIVE SLEEP APNEA 11/08/2010  . HYPERTENSION, PULMONARY 09/16/2010  . Hyperuricemia 11/28/2009  . HEARING LOSS, BILATERAL 06/18/2009  . Coronary atherosclerosis 03/07/2009  . Aortic valve disorder 03/07/2009  . CAROTID STENOSIS 03/07/2009  . Hyperlipidemia 11/15/2008  . Peripheral neuropathy, idiopathic 11/15/2008  . Essential hypertension 11/15/2008  . ARTHRALGIA 11/15/2008  . COLITIS, HX OF 11/15/2008  . OLECRANON BURSITIS, RIGHT 09/14/2007  . VENOUS INSUFFICIENCY 04/20/2007  . Edema 04/20/2007  . Gout 02/10/2007  . PSTPRC  STATUS, PTCA 02/10/2007    Current Outpatient Medications on File Prior to Visit  Medication Sig Dispense Refill  . allopurinol (ZYLOPRIM) 300 MG tablet Take 1 tablet (300 mg total) by mouth daily as needed (gout flare). 30 tablet 0  . Cholecalciferol (VITAMIN D3) 2000 UNITS capsule Take 1 capsule (2,000 Units total) by mouth daily.    . Flavocoxid (LIMBREL) 500 MG CAPS Take 500 mg by mouth daily.    Marland Kitchen gabapentin (NEURONTIN) 600 MG tablet Take 1 tablet (600 mg total) by mouth 2 (two) times daily. 60 tablet 0  . losartan (COZAAR) 100 MG tablet Take 1 tablet (100 mg total) by mouth daily. 90 tablet 3  . metoprolol (LOPRESSOR) 50 MG tablet Take 25 mg by mouth 2 (two) times daily. 1/2 tab by mouth two times a day.     . pravastatin (PRAVACHOL) 40 MG tablet Take 40 mg by mouth daily.      Marland Kitchen terazosin (HYTRIN) 10 MG capsule Take 1 capsule (10 mg total) by mouth at bedtime. 30 capsule 5   No current facility-administered medications on file prior to visit.     Past Medical History:  Diagnosis Date  . Anxiety   . Aortic stenosis   . Arthralgia   . Arthritis   . CAD (coronary artery disease)   . Carotid stenosis   . Cervicalgia   . CLUSTER HEADACHE 02/10/2007   Qualifier: Diagnosis of  By: Linna Darner MD, Gwyndolyn Saxon    . Colitis   . Edema   .  Gout   . Hearing loss   . Herpes zoster without mention of complication   . Hyperlipidemia   . Hypertension   . Hyperuricemia   . Impacted cerumen   . Migraine   . Migraine   . Murmur   . Olecranon bursitis   . OSA (obstructive sleep apnea)   . Other and unspecified hyperlipidemia   . Other malaise and fatigue   . Peripheral neuropathy   . Polyneuropathy   . Pulmonary hypertension ([place])   . Unspecified hereditary and idiopathic peripheral neuropathy   . Venous insufficiency     Past Surgical History:  Procedure Laterality Date  . ANGIOPLASTY     w/ 2 stents in 1999  . APPENDECTOMY    . HEMORRHOID SURGERY    . LIPOMA EXCISION    .  TONSILLECTOMY      Social History   Socioeconomic History  . Marital status: Married    Spouse name: [name]  . Number of children: 2  . Years of education: college  . Highest education level: None  Social Needs  . Financial resource strain: None  . Food insecurity - worry: None  . Food insecurity - inability: None  . Transportation needs - medical: None  . Transportation needs - non-medical: None  Occupational History  . 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 Smoking status: Never Smoker  . Smokeless tobacco: Never Used  Substance and Sexual Activity  . Alcohol use: Yes    Alcohol/week: 0.0 oz    Comment: 2 drink before dinner vodka   . Drug use: No  . Sexual activity: None  Other Topics Concern  . None  Social History Narrative   Pt is married and lives with wife (suzanne). Patient finished  two years of college.    Pt has children.   Caffeine -two cups daily.   Left handed.   Retired Licensed conveyancer                     Family History  Problem Relation Age of Onset  . Stroke Father        Died, 83  . High blood pressure Father   . Diabetes Father   . Other Mother        Died, 88  . Diabetes Brother        Died, 82  . Diabetes Sister        Living, 27  . Healthy Daughter   . Healthy Son     Review of Systems  Constitutional: Negative for fever.  Respiratory: Negative for cough, shortness of breath and wheezing.   Cardiovascular: Negative for chest pain, palpitations and leg swelling.  Neurological: Positive for light-headedness. Negative for headaches.       Objective:   Vitals:   09/04/17 1457  BP: (!) 168/60  Pulse: (!) 52  Resp: 16  Temp: 98.2 F (36.8 C)  SpO2: 97%   Wt Readings from Last 3 Encounters:  09/04/17 182 lb (82.6 kg)  08/31/17 183 lb (83 kg)  06/01/17 183 lb (83 kg)   Body mass index is 29.38 kg/m.   Physical Exam    Constitutional: Appears  well-developed and well-nourished. No distress.  HENT:  Head: Normocephalic and atraumatic.  Neck: Neck supple. No tracheal deviation present. No thyromegaly present.  No cervical lymphadenopathy Cardiovascular: Normal rate, regular rhythm and normal heart sounds.   No  murmur heard. No carotid bruit .  No edema Pulmonary/Chest: Effort normal and breath sounds normal. No respiratory distress. No has no wheezes. No rales.  Skin: Skin is warm and dry. Not diaphoretic.  Psychiatric: Normal mood and affect. Behavior is normal.   PRE-PROCEDURE EXAM: Right TM cannot be visualized due to total occlusion/impaction of the ear canal. PROCEDURE INDICATION: remove wax to visualize ear drum & relieve discomfort CONSENT:  Verbal  PROCEDURE NOTE:   RIGHT EAR:  The CMA used a metal wax curette under direct vision with an otoscope to free the wax bolus from the ear wall and then successfully removed a small bit of wax. The ear was then irrigated with warm water to remove the remaining wax. POST- PROCEDURE EXAM: R TM successfully visualized and found to have no erythema     Assessment & Plan:    See Problem List for Assessment and Plan of chronic medical problems.

## 2017-09-04 ENCOUNTER — Encounter: Payer: Self-pay | Admitting: Internal Medicine

## 2017-09-04 ENCOUNTER — Ambulatory Visit (INDEPENDENT_AMBULATORY_CARE_PROVIDER_SITE_OTHER): Payer: Medicare Other | Admitting: Internal Medicine

## 2017-09-04 VITALS — BP 168/60 | HR 52 | Temp 98.2°F | Resp 16 | Wt 182.0 lb

## 2017-09-04 DIAGNOSIS — G609 Hereditary and idiopathic neuropathy, unspecified: Secondary | ICD-10-CM

## 2017-09-04 DIAGNOSIS — M109 Gout, unspecified: Secondary | ICD-10-CM

## 2017-09-04 DIAGNOSIS — I10 Essential (primary) hypertension: Secondary | ICD-10-CM | POA: Diagnosis not present

## 2017-09-04 DIAGNOSIS — E7849 Other hyperlipidemia: Secondary | ICD-10-CM | POA: Diagnosis not present

## 2017-09-04 DIAGNOSIS — H9191 Unspecified hearing loss, right ear: Secondary | ICD-10-CM | POA: Insufficient documentation

## 2017-09-04 NOTE — Patient Instructions (Addendum)
Your ear was cleaned out.   All other Health Maintenance issues reviewed.   All recommended immunizations and age-appropriate screenings are up-to-date or discussed.  No immunizations administered today.   Medications reviewed and updated.  No changes recommended at this time.   Please followup in 6 months

## 2017-09-05 NOTE — Assessment & Plan Note (Signed)
Right ear impacted with cerumen - s/p ear lavage  Hearing restored  No other concerning symptoms

## 2017-09-05 NOTE — Assessment & Plan Note (Signed)
Controlled, stable Continue current dose of medication  

## 2017-09-05 NOTE — Assessment & Plan Note (Signed)
Continue statin Will have blood work at [place]

## 2017-09-05 NOTE — Assessment & Plan Note (Signed)
Elevated today, but better in past  BP Readings from Last 3 Encounters:  09/04/17 (!) 168/60  08/31/17 (!) 150/64  06/01/17 110/62   His wife is in the hospital creating extra stress and he feels that is why his BP is elevated No change in medications Encouraged weight loss Will have blood work at [place] - he will see if he can get me a copy

## 2017-09-05 NOTE — Assessment & Plan Note (Signed)
Controlled, stable - no gout symptoms Continue current dose of medication

## 2017-12-09 ENCOUNTER — Other Ambulatory Visit (INDEPENDENT_AMBULATORY_CARE_PROVIDER_SITE_OTHER): Payer: Medicare Other

## 2017-12-09 ENCOUNTER — Encounter: Payer: Self-pay | Admitting: Internal Medicine

## 2017-12-09 ENCOUNTER — Ambulatory Visit (INDEPENDENT_AMBULATORY_CARE_PROVIDER_SITE_OTHER): Payer: Medicare Other | Admitting: Internal Medicine

## 2017-12-09 VITALS — BP 138/70 | HR 57 | Temp 98.1°F | Resp 16 | Wt 182.0 lb

## 2017-12-09 DIAGNOSIS — M109 Gout, unspecified: Secondary | ICD-10-CM

## 2017-12-09 DIAGNOSIS — I2510 Atherosclerotic heart disease of native coronary artery without angina pectoris: Secondary | ICD-10-CM

## 2017-12-09 DIAGNOSIS — G609 Hereditary and idiopathic neuropathy, unspecified: Secondary | ICD-10-CM

## 2017-12-09 DIAGNOSIS — E7849 Other hyperlipidemia: Secondary | ICD-10-CM

## 2017-12-09 DIAGNOSIS — I10 Essential (primary) hypertension: Secondary | ICD-10-CM

## 2017-12-09 DIAGNOSIS — I422 Other hypertrophic cardiomyopathy: Secondary | ICD-10-CM

## 2017-12-09 LAB — CBC WITH DIFFERENTIAL/PLATELET
Basophils Absolute: 0 10*3/uL (ref 0.0–0.1)
Basophils Relative: 0.7 % (ref 0.0–3.0)
Eosinophils Absolute: 0.1 10*3/uL (ref 0.0–0.7)
Eosinophils Relative: 2.1 % (ref 0.0–5.0)
HCT: 41.6 % (ref 39.0–52.0)
Hemoglobin: 14 g/dL (ref 13.0–17.0)
LYMPHS ABS: 1.1 10*3/uL (ref 0.7–4.0)
Lymphocytes Relative: 20.2 % (ref 12.0–46.0)
MCHC: 33.8 g/dL (ref 30.0–36.0)
MCV: 100.1 fl — AB (ref 78.0–100.0)
MONO ABS: 0.7 10*3/uL (ref 0.1–1.0)
MONOS PCT: 12.3 % — AB (ref 3.0–12.0)
NEUTROS ABS: 3.5 10*3/uL (ref 1.4–7.7)
NEUTROS PCT: 64.7 % (ref 43.0–77.0)
PLATELETS: 116 10*3/uL — AB (ref 150.0–400.0)
RBC: 4.15 Mil/uL — AB (ref 4.22–5.81)
RDW: 14.4 % (ref 11.5–15.5)
WBC: 5.3 10*3/uL (ref 4.0–10.5)

## 2017-12-09 LAB — COMPREHENSIVE METABOLIC PANEL
ALK PHOS: 27 U/L — AB (ref 39–117)
ALT: 15 U/L (ref 0–53)
AST: 17 U/L (ref 0–37)
Albumin: 4 g/dL (ref 3.5–5.2)
BILIRUBIN TOTAL: 1.5 mg/dL — AB (ref 0.2–1.2)
BUN: 16 mg/dL (ref 6–23)
CO2: 31 mEq/L (ref 19–32)
CREATININE: 1.02 mg/dL (ref 0.40–1.50)
Calcium: 9.3 mg/dL (ref 8.4–10.5)
Chloride: 107 mEq/L (ref 96–112)
GFR: 73.25 mL/min (ref >60.00)
GLUCOSE: 97 mg/dL (ref 70–99)
POTASSIUM: 4.7 meq/L (ref 3.5–5.1)
SODIUM: 142 meq/L (ref 135–145)
TOTAL PROTEIN: 6.3 g/dL (ref 6.0–8.3)

## 2017-12-09 LAB — LIPID PANEL
CHOLESTEROL: 149 mg/dL (ref 0–200)
HDL: 72.3 mg/dL (ref >39.00)
LDL Cholesterol: 65 mg/dL (ref 0–99)
NonHDL: 76.74
Total CHOL/HDL Ratio: 2
Triglycerides: 57 mg/dL (ref 0.0–149.0)
VLDL: 11.4 mg/dL (ref 0.0–40.0)

## 2017-12-09 LAB — URIC ACID: URIC ACID, SERUM: 3.2 mg/dL — AB (ref 4.0–7.8)

## 2017-12-09 NOTE — Assessment & Plan Note (Signed)
Asymptomatic Following with cardiology Continue current medications

## 2017-12-09 NOTE — Assessment & Plan Note (Signed)
No chest pain, palps, sob Following with cardiology Continue current medications

## 2017-12-09 NOTE — Progress Notes (Signed)
Subjective:    Patient ID: Paul Mays, male    DOB: 08-16-1928, 84 y.o.   MRN: [id number]  HPI The patient is here for follow up.  CAD, HCM, mild AS, Hypertension: He is taking his medication daily. He is compliant with a low sodium diet.  He denies chest pain, palpitations, shortness of breath and regular headaches. He is not exercising regularly.  He does monitor his blood pressure at home.    Hyperlipidemia: He is taking his medication daily. He is compliant with a low fat/cholesterol diet. He is not exercising regularly. He denies myalgias.   Peripheral neuropathy:  He has seen neuro - related to chronic alcohol consumption:  Taking gabapentin two times daily.  He feels his pain is not as well controlled.   Gout:  He is taking allopurinol daily.  He uses colchicine prn. He has not had any gout symptoms in years.   Medications and allergies reviewed with patient and updated if appropriate.  Patient Active Problem List   Diagnosis Date Noted  . Decreased hearing of right ear 09/04/2017  . Rotator cuff tear arthropathy of both shoulders 10/18/2015  . Thrombocytopenia ([place]) 09/24/2015  . Diverticulosis of colon without hemorrhage 07/02/2014  . Hypertrophic cardiomyopathy ([place]) 03/24/2011  . OBSTRUCTIVE SLEEP APNEA 11/08/2010  . HYPERTENSION, PULMONARY 09/16/2010  . HEARING LOSS, BILATERAL 06/18/2009  . Coronary atherosclerosis 03/07/2009  . Aortic valve disorder 03/07/2009  . CAROTID STENOSIS 03/07/2009  . Hyperlipidemia 11/15/2008  . Peripheral neuropathy, idiopathic 11/15/2008  . Essential hypertension 11/15/2008  . ARTHRALGIA 11/15/2008  . COLITIS, HX OF 11/15/2008  . OLECRANON BURSITIS, RIGHT 09/14/2007  . VENOUS INSUFFICIENCY 04/20/2007  . Edema 04/20/2007  . Gout 02/10/2007  . PSTPRC STATUS, PTCA 02/10/2007    Current Outpatient Medications on File Prior to Visit  Medication Sig Dispense Refill  . allopurinol (ZYLOPRIM) 300 MG tablet Take 1 tablet (300 mg  total) by mouth daily as needed (gout flare). 30 tablet 0  . Cholecalciferol (VITAMIN D3) 2000 UNITS capsule Take 1 capsule (2,000 Units total) by mouth daily.    . Flavocoxid (LIMBREL) 500 MG CAPS Take 500 mg by mouth daily.    Marland Kitchen gabapentin (NEURONTIN) 600 MG tablet Take 1 tablet (600 mg total) by mouth 2 (two) times daily. 60 tablet 0  . losartan (COZAAR) 100 MG tablet Take 1 tablet (100 mg total) by mouth daily. 90 tablet 3  . metoprolol (LOPRESSOR) 50 MG tablet Take 25 mg by mouth 2 (two) times daily. 1/2 tab by mouth two times a day.     . pravastatin (PRAVACHOL) 40 MG tablet Take 40 mg by mouth daily.      Marland Kitchen terazosin (HYTRIN) 10 MG capsule Take 1 capsule (10 mg total) by mouth at bedtime. 30 capsule 5   No current facility-administered medications on file prior to visit.     Past Medical History:  Diagnosis Date  . Anxiety   . Aortic stenosis   . Arthralgia   . Arthritis   . CAD (coronary artery disease)   . Carotid stenosis   . Cervicalgia   . CLUSTER HEADACHE 02/10/2007   Qualifier: Diagnosis of  By: Linna Darner MD, Gwyndolyn Saxon    . Colitis   . Edema   . Gout   . Hearing loss   . Herpes zoster without mention of complication   . Hyperlipidemia   . Hypertension   . Hyperuricemia   . Impacted cerumen   . Migraine   . Migraine   .  Murmur   . Olecranon bursitis   . OSA (obstructive sleep apnea)   . Other and unspecified hyperlipidemia   . Other malaise and fatigue   . Peripheral neuropathy   . Polyneuropathy   . Pulmonary hypertension ([place])   . Unspecified hereditary and idiopathic peripheral neuropathy   . Venous insufficiency     Past Surgical History:  Procedure Laterality Date  . ANGIOPLASTY     w/ 2 stents in 1999  . APPENDECTOMY    . HEMORRHOID SURGERY    . LIPOMA EXCISION    . TONSILLECTOMY      Social History   Socioeconomic History  . Marital status: Married    Spouse name: [name]  . Number of children: 2  . Years of education: college  . Highest  education level: None  Social Needs  . Financial resource strain: None  . Food insecurity - worry: None  . Food insecurity - inability: None  . Transportation needs - medical: None  . Transportation needs - non-medical: None  Occupational History  . 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 Smoking status: Never Smoker  . Smokeless tobacco: Never Used  Substance and Sexual Activity  . Alcohol use: Yes    Alcohol/week: 0.0 oz    Comment: 2 drink before dinner vodka   . Drug use: No  . Sexual activity: None  Other Topics Concern  . None  Social History Narrative   Pt is married and lives with wife (suzanne). Patient finished  two years of college.    Pt has children.   Caffeine -two cups daily.   Left handed.   Retired Licensed conveyancer                     Family History  Problem Relation Age of Onset  . Stroke Father        Died, 83  . High blood pressure Father   . Diabetes Father   . Other Mother        Died, 88  . Diabetes Brother        Died, 82  . Diabetes Sister        Living, 44  . Healthy Daughter   . Healthy Son     Review of Systems  Constitutional: Negative for chills and fever.  Respiratory: Negative for cough, shortness of breath and wheezing.   Cardiovascular: Positive for leg swelling. Negative for chest pain and palpitations.  Musculoskeletal:       No gout symptoms  Neurological: Positive for light-headedness (started long time). Negative for headaches.       Objective:   Vitals:   12/09/17 1414 12/09/17 1454  BP: (!) 150/70 138/70  Pulse: (!) 57   Resp: 16   Temp: 98.1 F (36.7 C)   SpO2: 97%    Wt Readings from Last 3 Encounters:  12/09/17 182 lb (82.6 kg)  09/04/17 182 lb (82.6 kg)  08/31/17 183 lb (83 kg)   Body mass index is 29.38 kg/m.   Physical Exam    Constitutional: Appears well-developed and well-nourished. No distress.  HENT:  Head: Normocephalic and  atraumatic.  Neck: Neck supple. No tracheal deviation present. No thyromegaly present.  No cervical lymphadenopathy Cardiovascular: Normal rate, regular rhythm and normal heart sounds.   2/6 systolic murmur heard. No carotid bruit .  1 + pitting b/l LE edema Pulmonary/Chest: Effort normal and  breath sounds normal. No respiratory distress. No has no wheezes. No rales.  Skin: Skin is warm and dry. Not diaphoretic.  Psychiatric: Normal mood and affect. Behavior is normal.      Assessment & Plan:    See Problem List for Assessment and Plan of chronic medical problems.

## 2017-12-09 NOTE — Patient Instructions (Addendum)
  Test(s) ordered today. Your results will be released to [place] (or called to you) after review, usually within 72hours after test completion. If any changes need to be made, you will be notified at that same time.   Medications reviewed and updated.  No changes recommended at this time.  Start monitoring your BP at home - it should be less than 140/90.  Talk to the [facility] about increasing your gabapentin.    Please followup in 6 months

## 2017-12-09 NOTE — Assessment & Plan Note (Addendum)
BP elevated here, better on recheck Start monitoring at home Continue current medications cmp

## 2017-12-09 NOTE — Assessment & Plan Note (Signed)
No gout symptoms Continue current dose of allopurinol

## 2017-12-09 NOTE — Assessment & Plan Note (Signed)
Check lipid panel  Continue daily statin Regular exercise and healthy diet encouraged  

## 2017-12-09 NOTE — Assessment & Plan Note (Signed)
Not ideally controlled Taking 600 mg twice daily - can increase to 700 or 800 mg twice daily  He will discuss with the [place]

## 2017-12-16 NOTE — Progress Notes (Signed)
[name] [address] [place], [place] [address] Phone: [phone] Subjective:    CC: Bilateral shoulder pain  AOZ:[id number]  Paul Mays is a 84 y.o. male coming in with complaint of bilateral shoulder pain.  Does have known rotator cuff arthropathy.  Has responded fairly well to steroid injections in the past.  Last injections were greater than 12 weeks ago.  Patient states that his pain has been on and off for a while now and he is here for injections in both shoulders.     Past Medical History:  Diagnosis Date  . Anxiety   . Aortic stenosis   . Arthralgia   . Arthritis   . CAD (coronary artery disease)   . Carotid stenosis   . Cervicalgia   . CLUSTER HEADACHE 02/10/2007   Qualifier: Diagnosis of  By: Linna Darner MD, Gwyndolyn Saxon    . Colitis   . Edema   . Gout   . Hearing loss   . Herpes zoster without mention of complication   . Hyperlipidemia   . Hypertension   . Hyperuricemia   . Impacted cerumen   . Migraine   . Migraine   . Murmur   . Olecranon bursitis   . OSA (obstructive sleep apnea)   . Other and unspecified hyperlipidemia   . Other malaise and fatigue   . Peripheral neuropathy   . Polyneuropathy   . Pulmonary hypertension ([place])   . Unspecified hereditary and idiopathic peripheral neuropathy   . Venous insufficiency    Past Surgical History:  Procedure Laterality Date  . ANGIOPLASTY     w/ 2 stents in 1999  . APPENDECTOMY    . HEMORRHOID SURGERY    . LIPOMA EXCISION    . TONSILLECTOMY     Social History   Socioeconomic History  . Marital status: Married    Spouse name: [name]  . Number of children: 2  . Years of education: college  . Highest education level: None  Social Needs  . Financial resource strain: None  . Food insecurity - worry: None  . Food insecurity - inability: None  . Transportation needs - medical: None  . Transportation needs - non-medical: None  Occupational History  . 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 Smoking status: Never Smoker  . Smokeless tobacco: Never Used  Substance and Sexual Activity  . Alcohol use: Yes    Alcohol/week: 0.0 oz    Comment: 2 drink before dinner vodka   . Drug use: No  . Sexual activity: None  Other Topics Concern  . None  Social History Narrative   Pt is married and lives with wife (suzanne). Patient finished  two years of college.    Pt has children.   Caffeine -two cups daily.   Left handed.   Retired Licensed conveyancer                    Allergies  Allergen Reactions  . Lisinopril Other (See Comments)    Hyperkalemia in the context of combined spironolactone and lisinopril therapy  . Spironolactone Other (See Comments)    Hyperkalemia in context of spironolactone and lisinopril therapy   Family History  Problem Relation Age of Onset  . Stroke Father        Died, 83  . High blood pressure Father   . Diabetes Father   . Other Mother  Died, 88  . Diabetes Brother        Died, 82  . Diabetes Sister        Living, 41  . Healthy Daughter   . Healthy Son      Past medical history, social, surgical and family history all reviewed in electronic medical record.  No pertanent information unless stated regarding to the chief complaint.   Review of Systems:Review of systems updated and as accurate as of 12/17/17  No headache, visual changes, nausea, vomiting, diarrhea, constipation, dizziness, abdominal pain, skin rash, fevers, chills, night sweats, weight loss, swollen lymph nodes, body aches, joint swelling, muscle aches, chest pain, shortness of breath, mood changes.  Positive muscle aches  Objective  Blood pressure (!) 142/76, pulse (!) 56, height 5\' 6"  (1.676 m), SpO2 96 %. Systems examined below as of 12/17/17   General: No apparent distress alert and oriented x3 mood and affect normal, dressed appropriately.  HEENT: Pupils equal, extraocular movements  intact  Respiratory: Patient's speak in full sentences and does not appear short of breath  Cardiovascular: No lower extremity edema, non tender, no erythema  Skin: Warm dry intact with no signs of infection or rash on extremities or on axial skeleton.  Abdomen: Soft nontender  Neuro: Cranial nerves II through XII are intact, neurovascularly intact in all extremities with 2+ DTRs and 2+ pulses.  Lymph: No lymphadenopathy of posterior or anterior cervical chain or axillae bilaterally.  Gait normal with good balance and coordination.  MSK:  Non tender with full range of motion and good stability and symmetric strength and tone of  elbows, wrist, hip, knee and ankles bilaterally.  Arthritic changes of multiple joints  Bilateral shoulder exam shows significant crepitus with decreasing range of motion in all planes.  Rotator cuff strength 2+ out of 5.  Neurovascularly intact distally.  Procedure: Real-time Ultrasound Guided Injection of right glenohumeral joint Device: GE Logiq Q7  Ultrasound guided injection is preferred based studies that show increased duration, increased effect, greater accuracy, decreased procedural pain, increased response rate with ultrasound guided versus blind injection.  Verbal informed consent obtained.  Time-out conducted.  Noted no overlying erythema, induration, or other signs of local infection.  Skin prepped in a sterile fashion.  Local anesthesia: Topical Ethyl chloride.  With sterile technique and under real time ultrasound guidance:  Joint visualized.  23g 1  inch needle inserted posterior approach. Pictures taken for needle placement. Patient did have injection of 2 cc of 1% lidocaine, 2 cc of 0.5% Marcaine, and 1.0 cc of Kenalog 40 mg/dL. Completed without difficulty  Pain immediately resolved suggesting accurate placement of the medication.  Advised to call if fevers/chills, erythema, induration, drainage, or persistent bleeding.  Images permanently stored  and available for review in the ultrasound unit.  Impression: Technically successful ultrasound guided injection.   Procedure: Real-time Ultrasound Guided Injection of left glenohumeral joint Device: GE Logiq E  Ultrasound guided injection is preferred based studies that show increased duration, increased effect, greater accuracy, decreased procedural pain, increased response rate with ultrasound guided versus blind injection.  Verbal informed consent obtained.  Time-out conducted.  Noted no overlying erythema, induration, or other signs of local infection.  Skin prepped in a sterile fashion.  Local anesthesia: Topical Ethyl chloride.  With sterile technique and under real time ultrasound guidance:  Joint visualized.  21g 2 inch needle inserted posterior approach. Pictures taken for needle placement. Patient did have injection of 2 cc of 0.5% Marcaine, and 1cc of  Kenalog 40 mg/dL. Completed without difficulty  Pain immediately resolved suggesting accurate placement of the medication.  Advised to call if fevers/chills, erythema, induration, drainage, or persistent bleeding.  Images permanently stored and available for review in the ultrasound unit.  Impression: Technically successful ultrasound guided injection.    Impression and Recommendations:     This case required medical decision making of moderate complexity.      Note: This dictation was prepared with Dragon dictation along with smaller phrase technology. Any transcriptional errors that result from this process are unintentional.

## 2017-12-17 ENCOUNTER — Ambulatory Visit: Payer: Self-pay

## 2017-12-17 ENCOUNTER — Encounter: Payer: Self-pay | Admitting: Family Medicine

## 2017-12-17 ENCOUNTER — Ambulatory Visit (INDEPENDENT_AMBULATORY_CARE_PROVIDER_SITE_OTHER): Payer: Medicare Other | Admitting: Family Medicine

## 2017-12-17 VITALS — BP 142/76 | HR 56 | Ht 66.0 in

## 2017-12-17 DIAGNOSIS — M25512 Pain in left shoulder: Secondary | ICD-10-CM | POA: Diagnosis not present

## 2017-12-17 DIAGNOSIS — I2510 Atherosclerotic heart disease of native coronary artery without angina pectoris: Secondary | ICD-10-CM | POA: Diagnosis not present

## 2017-12-17 DIAGNOSIS — M12812 Other specific arthropathies, not elsewhere classified, left shoulder: Secondary | ICD-10-CM

## 2017-12-17 DIAGNOSIS — G8929 Other chronic pain: Secondary | ICD-10-CM | POA: Diagnosis not present

## 2017-12-17 DIAGNOSIS — M75101 Unspecified rotator cuff tear or rupture of right shoulder, not specified as traumatic: Secondary | ICD-10-CM

## 2017-12-17 DIAGNOSIS — M12811 Other specific arthropathies, not elsewhere classified, right shoulder: Secondary | ICD-10-CM | POA: Diagnosis not present

## 2017-12-17 DIAGNOSIS — M25511 Pain in right shoulder: Principal | ICD-10-CM

## 2017-12-17 DIAGNOSIS — M75102 Unspecified rotator cuff tear or rupture of left shoulder, not specified as traumatic: Secondary | ICD-10-CM | POA: Diagnosis not present

## 2017-12-17 NOTE — Patient Instructions (Signed)
Great to see you  You know the drill  pennsaid pinkie amount topically 2 times daily as needed.  Ice 20 minutes 2 times daily. Usually after activity and before bed. See me again in 10 weeks

## 2017-12-17 NOTE — Assessment & Plan Note (Signed)
Worsening pain.  Repeat injection given again today.  Discussed icing regimen and home exercises.  Discussed which activity to doing which wants to avoid.  Discussed topical anti-inflammatories vitamin D supplementation.  Follow-up again in 10 weeks.

## 2018-01-14 DIAGNOSIS — Z85828 Personal history of other malignant neoplasm of skin: Secondary | ICD-10-CM | POA: Diagnosis not present

## 2018-01-14 DIAGNOSIS — M7981 Nontraumatic hematoma of soft tissue: Secondary | ICD-10-CM | POA: Diagnosis not present

## 2018-01-19 DIAGNOSIS — Z85828 Personal history of other malignant neoplasm of skin: Secondary | ICD-10-CM | POA: Diagnosis not present

## 2018-01-19 DIAGNOSIS — S80811S Abrasion, right lower leg, sequela: Secondary | ICD-10-CM | POA: Diagnosis not present

## 2018-02-24 NOTE — Progress Notes (Signed)
[name] [address] [place], [place] [address] Phone: [phone] Subjective:     CC: Bilateral shoulder pain  ACZ:[id number]  Paul Mays is a 84 y.o. male coming in with complaint of bilateral shoulder pain.  Severe OA bilaterally. Been doing injections, worsening symptoms. Discussed icing no longer helping.  Patient states waking up at night.  Affecting daily activities such as even dressing.      Past Medical History:  Diagnosis Date  . Anxiety   . Aortic stenosis   . Arthralgia   . Arthritis   . CAD (coronary artery disease)   . Carotid stenosis   . Cervicalgia   . CLUSTER HEADACHE 02/10/2007   Qualifier: Diagnosis of  By: Linna Darner MD, Gwyndolyn Saxon    . Colitis   . Edema   . Gout   . Hearing loss   . Herpes zoster without mention of complication   . Hyperlipidemia   . Hypertension   . Hyperuricemia   . Impacted cerumen   . Migraine   . Migraine   . Murmur   . Olecranon bursitis   . OSA (obstructive sleep apnea)   . Other and unspecified hyperlipidemia   . Other malaise and fatigue   . Peripheral neuropathy   . Polyneuropathy   . Pulmonary hypertension ([place])   . Unspecified hereditary and idiopathic peripheral neuropathy   . Venous insufficiency    Past Surgical History:  Procedure Laterality Date  . ANGIOPLASTY     w/ 2 stents in 1999  . APPENDECTOMY    . HEMORRHOID SURGERY    . LIPOMA EXCISION    . TONSILLECTOMY     Social History   Socioeconomic History  . Marital status: Married    Spouse name: [name]  . Number of children: 2  . Years of education: college  . Highest education level: Not on file  Occupational History  . 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 Financial resource strain: Not on file  . Food insecurity:    Worry: Not on file    Inability: Not on file  . Transportation needs:    Medical: Not on file    Non-medical: Not on file    Tobacco Use  . Smoking status: Never Smoker  . Smokeless tobacco: Never Used  Substance and Sexual Activity  . Alcohol use: Yes    Alcohol/week: 0.0 oz    Comment: 2 drink before dinner vodka   . Drug use: No  . Sexual activity: Not on file  Lifestyle  . Physical activity:    Days per week: Not on file    Minutes per session: Not on file  . Stress: Not on file  Relationships  . Social connections:    Talks on phone: Not on file    Gets together: Not on file    Attends religious service: Not on file    Active member of club or organization: Not on file    Attends meetings of clubs or organizations: Not on file    Relationship status: Not on file  Other Topics Concern  . Not on file  Social History Narrative   Pt is married and lives with wife (suzanne). Patient finished  two years of college.    Pt has children.   Caffeine -two cups daily.   Left handed.   Retired Licensed conveyancer  Allergies  Allergen Reactions  . Lisinopril Other (See Comments)    Hyperkalemia in the context of combined spironolactone and lisinopril therapy  . Spironolactone Other (See Comments)    Hyperkalemia in context of spironolactone and lisinopril therapy   Family History  Problem Relation Age of Onset  . Stroke Father        Died, 83  . High blood pressure Father   . Diabetes Father   . Other Mother        Died, 88  . Diabetes Brother        Died, 82  . Diabetes Sister        Living, 42  . Healthy Daughter   . Healthy Son      Past medical history, social, surgical and family history all reviewed in electronic medical record.  No pertanent information unless stated regarding to the chief complaint.   Review of Systems:Review of systems updated and as accurate as of 02/25/18  No headache, visual changes, nausea, vomiting, diarrhea, constipation, dizziness, abdominal pain, skin rash, fevers, chills, night sweats, weight loss, swollen lymph nodes, body aches, joint  swelling, chest pain, shortness of breath, mood changes.  Positive muscle aches  Objective  Blood pressure 132/64, pulse (!) 53, height 5\' 6"  (1.676 m), weight 178 lb (80.7 kg), SpO2 93 %. Systems examined below as of 02/25/18   General: No apparent distress alert and oriented x3 mood and affect normal, dressed appropriately.  HEENT: Pupils equal, extraocular movements intact  Respiratory: Patient's speak in full sentences and does not appear short of breath  Cardiovascular: No lower extremity edema, non tender, no erythema  Skin: Warm dry intact with no signs of infection or rash on extremities or on axial skeleton.  Abdomen: Soft nontender  Neuro: Cranial nerves II through XII are intact, neurovascularly intact in all extremities with 2+ DTRs and 2+ pulses.  Lymph: No lymphadenopathy of posterior or anterior cervical chain or axillae bilaterally.  Gait normal with good balance and coordination.  MSK:  tender with limited range of motion and good stability and symmetric strength and tone of shoulders, elbows, wrist, hip, knee and ankles bilaterally.  Arthritic changes. Mild tightness of the shoulder girdle bilaterally.  Atrophy noted.  Patient does have significant crepitus and decreased range of motion in all planes.  Positive impingement.  3 out of 5 strength of the rotator cuff bilaterally.  After informed written and verbal consent, patient was seated on exam table. Right shoulder was prepped with alcohol swab and utilizing posterior approach, patient's right glenohumeral space was injected with 4:1  marcaine 0.5%: Kenalog 40mg /dL. Patient tolerated the procedure well without immediate complications.  After informed written and verbal consent, patient was seated on exam table. Left shoulder was prepped with alcohol swab and utilizing posterior approach, patient's right glenohumeral space was injected with 4:1  marcaine 0.5%: Kenalog 40mg /dL. Patient tolerated the procedure well without  immediate complications.       Impression and Recommendations:     This case required medical decision making of moderate complexity.      Note: This dictation was prepared with Dragon dictation along with smaller phrase technology. Any transcriptional errors that result from this process are unintentional.

## 2018-02-25 ENCOUNTER — Ambulatory Visit (INDEPENDENT_AMBULATORY_CARE_PROVIDER_SITE_OTHER): Payer: Medicare Other | Admitting: Family Medicine

## 2018-02-25 ENCOUNTER — Encounter: Payer: Self-pay | Admitting: Family Medicine

## 2018-02-25 DIAGNOSIS — M12811 Other specific arthropathies, not elsewhere classified, right shoulder: Secondary | ICD-10-CM | POA: Diagnosis not present

## 2018-02-25 DIAGNOSIS — M12812 Other specific arthropathies, not elsewhere classified, left shoulder: Secondary | ICD-10-CM | POA: Diagnosis not present

## 2018-02-25 DIAGNOSIS — I2510 Atherosclerotic heart disease of native coronary artery without angina pectoris: Secondary | ICD-10-CM | POA: Diagnosis not present

## 2018-02-25 DIAGNOSIS — M75102 Unspecified rotator cuff tear or rupture of left shoulder, not specified as traumatic: Secondary | ICD-10-CM | POA: Diagnosis not present

## 2018-02-25 DIAGNOSIS — M75101 Unspecified rotator cuff tear or rupture of right shoulder, not specified as traumatic: Secondary | ICD-10-CM | POA: Diagnosis not present

## 2018-02-25 NOTE — Assessment & Plan Note (Signed)
Patient given bilateral injections.  Discussed icing regimen and home exercise.  Discussed which activities to do which was to avoid.  Patient will follow-up with 10 weeks

## 2018-02-25 NOTE — Patient Instructions (Signed)
Good to see you  Ice is your friend Stay active Vitamin D is still helpful  See me again in 10 weeks

## 2018-03-18 NOTE — Progress Notes (Signed)
Subjective:    Patient ID: Paul Mays, male    DOB: [birth year], 84 y.o.   MRN: [id number]  HPI The patient is here for an acute visit.  Bruising:  It started a long time ago.  He has it on his legs and arms.  Some are from very mild trauma.  The bruises on his hands are not from trauma.    He does not take any baby aspirin.  He occasionally takes an aleve for his arthritis - maybe once a week.   Some days it is worse than others.  The brusies do not hurt.  The bruises do not go away - they have turned brown in some areas.    He is not taking any supplements, except vitamin d.  He does have a history of at least a couple of drinks every night.     Medications and allergies reviewed with patient and updated if appropriate.  Patient Active Problem List   Diagnosis Date Noted  . Decreased hearing of right ear 09/04/2017  . Rotator cuff tear arthropathy of both shoulders 10/18/2015  . Thrombocytopenia ([place]) 09/24/2015  . Diverticulosis of colon without hemorrhage 07/02/2014  . Hypertrophic cardiomyopathy ([place]) 03/24/2011  . OBSTRUCTIVE SLEEP APNEA 11/08/2010  . HYPERTENSION, PULMONARY 09/16/2010  . HEARING LOSS, BILATERAL 06/18/2009  . Coronary atherosclerosis 03/07/2009  . Aortic valve disorder 03/07/2009  . CAROTID STENOSIS 03/07/2009  . Hyperlipidemia 11/15/2008  . Peripheral neuropathy, idiopathic 11/15/2008  . Essential hypertension 11/15/2008  . ARTHRALGIA 11/15/2008  . COLITIS, HX OF 11/15/2008  . VENOUS INSUFFICIENCY 04/20/2007  . Edema 04/20/2007  . Gout 02/10/2007  . PSTPRC STATUS, PTCA 02/10/2007    Current Outpatient Medications on File Prior to Visit  Medication Sig Dispense Refill  . allopurinol (ZYLOPRIM) 300 MG tablet Take 1 tablet (300 mg total) by mouth daily as needed (gout flare). 30 tablet 0  . Cholecalciferol (VITAMIN D3) 2000 UNITS capsule Take 1 capsule (2,000 Units total) by mouth daily.    . Flavocoxid (LIMBREL) 500 MG CAPS Take 500 mg by mouth  daily.    Marland Kitchen gabapentin (NEURONTIN) 600 MG tablet Take 1 tablet (600 mg total) by mouth 2 (two) times daily. 60 tablet 0  . losartan (COZAAR) 100 MG tablet Take 1 tablet (100 mg total) by mouth daily. 90 tablet 3  . metoprolol (LOPRESSOR) 50 MG tablet Take 25 mg by mouth 2 (two) times daily. 1/2 tab by mouth two times a day.     . pravastatin (PRAVACHOL) 40 MG tablet Take 40 mg by mouth daily.      Marland Kitchen terazosin (HYTRIN) 10 MG capsule Take 1 capsule (10 mg total) by mouth at bedtime. 30 capsule 5   No current facility-administered medications on file prior to visit.     Past Medical History:  Diagnosis Date  . Anxiety   . Aortic stenosis   . Arthralgia   . Arthritis   . CAD (coronary artery disease)   . Carotid stenosis   . Cervicalgia   . CLUSTER HEADACHE 02/10/2007   Qualifier: Diagnosis of  By: Linna Darner MD, Gwyndolyn Saxon    . Colitis   . Edema   . Gout   . Hearing loss   . Herpes zoster without mention of complication   . Hyperlipidemia   . Hypertension   . Hyperuricemia   . Impacted cerumen   . Migraine   . Migraine   . Murmur   . Olecranon bursitis   . OSA (  obstructive sleep apnea)   . Other and unspecified hyperlipidemia   . Other malaise and fatigue   . Peripheral neuropathy   . Polyneuropathy   . Pulmonary hypertension ([place])   . Unspecified hereditary and idiopathic peripheral neuropathy   . Venous insufficiency     Past Surgical History:  Procedure Laterality Date  . ANGIOPLASTY     w/ 2 stents in 1999  . APPENDECTOMY    . HEMORRHOID SURGERY    . LIPOMA EXCISION    . TONSILLECTOMY      Social History   Socioeconomic History  . Marital status: Married    Spouse name: [name]  . Number of children: 2  . Years of education: college  . Highest education level: Not on file  Occupational History  . 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 Financial resource strain: Not on file  . Food insecurity:      Worry: Not on file    Inability: Not on file  . Transportation needs:    Medical: Not on file    Non-medical: Not on file  Tobacco Use  . Smoking status: Never Smoker  . Smokeless tobacco: Never Used  Substance and Sexual Activity  . Alcohol use: Yes    Alcohol/week: 0.0 oz    Comment: 2 drink before dinner vodka   . Drug use: No  . Sexual activity: Not on file  Lifestyle  . Physical activity:    Days per week: Not on file    Minutes per session: Not on file  . Stress: Not on file  Relationships  . Social connections:    Talks on phone: Not on file    Gets together: Not on file    Attends religious service: Not on file    Active member of club or organization: Not on file    Attends meetings of clubs or organizations: Not on file    Relationship status: Not on file  Other Topics Concern  . Not on file  Social History Narrative   Pt is married and lives with wife (suzanne). Patient finished  two years of college.    Pt has children.   Caffeine -two cups daily.   Left handed.   Retired Licensed conveyancer                     Family History  Problem Relation Age of Onset  . Stroke Father        Died, 83  . High blood pressure Father   . Diabetes Father   . Other Mother        Died, 88  . Diabetes Brother        Died, 82  . Diabetes Sister        Living, 62  . Healthy Daughter   . Healthy Son     Review of Systems  Constitutional: Negative for chills and fever.  HENT: Negative for nosebleeds.   Respiratory: Negative for shortness of breath.   Cardiovascular: Positive for leg swelling (ankles). Negative for chest pain and palpitations.  Gastrointestinal: Negative for abdominal pain and blood in stool.  Genitourinary: Negative for hematuria.  Neurological: Positive for light-headedness (occ from meds). Negative for headaches.       Objective:   Vitals:   03/19/18 1354  BP: 120/62  Pulse: 67  Resp: 16  Temp: 98.1 F (36.7 C)  SpO2: 98%  BP  Readings from Last 3 Encounters:  03/19/18 120/62  02/25/18 132/64  12/17/17 (!) 142/76   Wt Readings from Last 3 Encounters:  03/19/18 176 lb (79.8 kg)  02/25/18 178 lb (80.7 kg)  12/09/17 182 lb (82.6 kg)   Body mass index is 28.41 kg/m.   Physical Exam  Constitutional: He is oriented to person, place, and time. He appears well-developed and well-nourished. No distress.  HENT:  Head: Normocephalic and atraumatic.  Abdominal: There is no tenderness.  Obese abdomen  Musculoskeletal: He exhibits edema (trace b/l ankle edema).  Neurological: He is alert and oriented to person, place, and time.  Skin: Skin is warm and dry. He is not diaphoretic.  Bruising on posterior hand and forearms and old bruising on anterior lower legs. Bruises non tender, not palpable or flutcutance           Assessment & Plan:    See Problem List for Assessment and Plan of chronic medical problems.

## 2018-03-19 ENCOUNTER — Encounter: Payer: Self-pay | Admitting: Internal Medicine

## 2018-03-19 ENCOUNTER — Ambulatory Visit (INDEPENDENT_AMBULATORY_CARE_PROVIDER_SITE_OTHER): Payer: Medicare Other | Admitting: Internal Medicine

## 2018-03-19 ENCOUNTER — Other Ambulatory Visit (INDEPENDENT_AMBULATORY_CARE_PROVIDER_SITE_OTHER): Payer: Medicare Other

## 2018-03-19 VITALS — BP 120/62 | HR 67 | Temp 98.1°F | Resp 16 | Wt 176.0 lb

## 2018-03-19 DIAGNOSIS — R238 Other skin changes: Secondary | ICD-10-CM

## 2018-03-19 DIAGNOSIS — D696 Thrombocytopenia, unspecified: Secondary | ICD-10-CM | POA: Diagnosis not present

## 2018-03-19 DIAGNOSIS — I2510 Atherosclerotic heart disease of native coronary artery without angina pectoris: Secondary | ICD-10-CM | POA: Diagnosis not present

## 2018-03-19 DIAGNOSIS — R233 Spontaneous ecchymoses: Secondary | ICD-10-CM

## 2018-03-19 LAB — COMPREHENSIVE METABOLIC PANEL
ALBUMIN: 4 g/dL (ref 3.5–5.2)
ALK PHOS: 27 U/L — AB (ref 39–117)
ALT: 21 U/L (ref 0–53)
AST: 20 U/L (ref 0–37)
BILIRUBIN TOTAL: 1.8 mg/dL — AB (ref 0.2–1.2)
BUN: 26 mg/dL — ABNORMAL HIGH (ref 6–23)
CO2: 31 mEq/L (ref 19–32)
Calcium: 9.1 mg/dL (ref 8.4–10.5)
Chloride: 107 mEq/L (ref 96–112)
Creatinine, Ser: 1.13 mg/dL (ref 0.40–1.50)
GFR: 65.04 mL/min (ref >60.00)
GLUCOSE: 103 mg/dL — AB (ref 70–99)
POTASSIUM: 4.5 meq/L (ref 3.5–5.1)
SODIUM: 143 meq/L (ref 135–145)
TOTAL PROTEIN: 6.1 g/dL (ref 6.0–8.3)

## 2018-03-19 LAB — CBC WITH DIFFERENTIAL/PLATELET
BASOS ABS: 0 10*3/uL (ref 0.0–0.1)
BASOS PCT: 0.7 % (ref 0.0–3.0)
EOS ABS: 0.1 10*3/uL (ref 0.0–0.7)
Eosinophils Relative: 1.8 % (ref 0.0–5.0)
HEMATOCRIT: 39.8 % (ref 39.0–52.0)
Hemoglobin: 13.6 g/dL (ref 13.0–17.0)
LYMPHS PCT: 17.1 % (ref 12.0–46.0)
Lymphs Abs: 0.9 10*3/uL (ref 0.7–4.0)
MCHC: 34.1 g/dL (ref 30.0–36.0)
MCV: 98.8 fl (ref 78.0–100.0)
MONO ABS: 0.4 10*3/uL (ref 0.1–1.0)
Monocytes Relative: 8.1 % (ref 3.0–12.0)
NEUTROS ABS: 3.7 10*3/uL (ref 1.4–7.7)
NEUTROS PCT: 72.3 % (ref 43.0–77.0)
PLATELETS: 101 10*3/uL — AB (ref 150.0–400.0)
RBC: 4.03 Mil/uL — ABNORMAL LOW (ref 4.22–5.81)
RDW: 14.6 % (ref 11.5–15.5)
WBC: 5.1 10*3/uL (ref 4.0–10.5)

## 2018-03-19 LAB — PROTIME-INR
INR: 0.9 ratio (ref 0.8–1.0)
Prothrombin Time: 11.1 s (ref 9.6–13.1)

## 2018-03-19 NOTE — Patient Instructions (Addendum)
  Test(s) ordered today. Your results will be released to [place] (or called to you) after review, usually within 72hours after test completion. If any changes need to be made, you will be notified at that same time.   Medications reviewed and updated.  No changes recommended at this time.    A referral was ordered for hematology

## 2018-03-20 DIAGNOSIS — R233 Spontaneous ecchymoses: Secondary | ICD-10-CM | POA: Insufficient documentation

## 2018-03-20 DIAGNOSIS — R238 Other skin changes: Secondary | ICD-10-CM | POA: Insufficient documentation

## 2018-03-20 NOTE — Assessment & Plan Note (Signed)
Chronic Check cbc ? Splenomegaly from chronic alcohol use/cirrhosis May need abdomen US Will refer to hematology

## 2018-03-20 NOTE — Assessment & Plan Note (Signed)
Chronic Occurs in setting from mild trauma for the most part No obvious medication causes Has mild thrombocytopenia Check cbc, cmp, pt/int ? Splenomegaly from chronic alcohol use/cirrhosis May need abdomen US Will refer to hematology

## 2018-03-23 ENCOUNTER — Other Ambulatory Visit: Payer: Self-pay | Admitting: Internal Medicine

## 2018-03-23 DIAGNOSIS — D696 Thrombocytopenia, unspecified: Secondary | ICD-10-CM

## 2018-03-30 ENCOUNTER — Telehealth: Payer: Self-pay | Admitting: Internal Medicine

## 2018-03-30 NOTE — Telephone Encounter (Signed)
Copied from [place] [id number]. Topic: Referral - Request >> Mar 30, 2018  3:54 PM Gardiner Ramus wrote: Reason for CRM: pt called and stated that he would like to be referred to a hematologist. Please advise

## 2018-03-30 NOTE — Telephone Encounter (Signed)
LOV 03/19/18

## 2018-03-31 NOTE — Telephone Encounter (Signed)
This was placed on 03/19/18. Will you take a look at this please

## 2018-03-31 NOTE — Telephone Encounter (Signed)
It looks like Paul Mays just worked on it

## 2018-04-02 ENCOUNTER — Telehealth: Payer: Self-pay | Admitting: Hematology

## 2018-04-02 ENCOUNTER — Encounter: Payer: Self-pay | Admitting: Hematology

## 2018-04-02 NOTE — Telephone Encounter (Signed)
New hematology referral received from Dr. Quay Burow for dx of thrombocytopenia. Pt has been scheduled to see Dr. Irene Limbo on 7/18 at 1pm. Letter mailed with the appt date and time.

## 2018-04-09 ENCOUNTER — Ambulatory Visit
Admission: RE | Admit: 2018-04-09 | Discharge: 2018-04-09 | Disposition: A | Discharge status: Home / Self Care | Payer: Medicare Other | Source: Ambulatory Visit | Attending: Internal Medicine | Admitting: Internal Medicine

## 2018-04-09 ENCOUNTER — Encounter: Payer: Self-pay | Admitting: Internal Medicine

## 2018-04-09 DIAGNOSIS — D696 Thrombocytopenia, unspecified: Secondary | ICD-10-CM

## 2018-04-09 DIAGNOSIS — N281 Cyst of kidney, acquired: Secondary | ICD-10-CM | POA: Diagnosis not present

## 2018-04-15 ENCOUNTER — Telehealth: Payer: Self-pay | Admitting: Emergency Medicine

## 2018-04-15 NOTE — Telephone Encounter (Signed)
Spoke with pt to inform. Per Pt does not have computer to use MyChart, requested to deactivate.

## 2018-04-15 NOTE — Telephone Encounter (Signed)
Copied from [place] [id number]. Topic: Quick Communication - Other Results >> Apr 15, 2018 11:03 AM Synthia Innocent wrote: Requesting [name] results >> Apr 15, 2018 11:38 AM [place], Isidoro Donning wrote: I see a message was sent through [place] about this.Marland Kitchen He must have not seen it.

## 2018-04-21 NOTE — Progress Notes (Signed)
HEMATOLOGY/ONCOLOGY CONSULTATION NOTE  Date of Service: 04/22/2018  Patient Care Team: Binnie Rail, MD as PCP - General (Internal Medicine)  CHIEF COMPLAINTS/PURPOSE OF CONSULTATION:  Thrombocytopenia   HISTORY OF PRESENTING ILLNESS:   Paul Mays is a wonderful 84 y.o. male who has been referred to [name] by Dr. Billey Gosling for evaluation and management of Thrombocytopenia. The pt reports that he is doing well overall.   The pt reports that his primary concern is that he bruises relatively easy and presents with some purpura on his arms and hands. He notes that his platelets have been low for a very long time.  He denies taking steroids or prednisone recently. He denies any concerns for bleeding in past surgeries. He notes that he does play with his cockapoo and may be getting some bruises from this.   The pt notes that he stays quite active and doesn't enjoy sitting around very much. He denies using a CPAP for sleep apnea, not being able to tolerate it well.   Most recent lab results (03/19/18) of CBC w/diff is as follows: all values are WNL except for RBC at 4.03, PLT at 101k. Upon review of the patient's CBC history, his PLT have been low since at least 2012 at 94k.   On review of systems, pt reports good energy levels, arm bruising, and denies concerns for bleeding, nose bleeds, gum bleeds, and any other symptoms.   On PMHx the pt reports HTN with 100mg  Losartan daily, and denies liver problems. On Social Hx the pt reports 1-2, 2 oz drinks of vodka each night since retiring.    MEDICAL HISTORY:  Past Medical History:  Diagnosis Date  . Anxiety   . Aortic stenosis   . Arthralgia   . Arthritis   . CAD (coronary artery disease)   . Carotid stenosis   . Cervicalgia   . CLUSTER HEADACHE 02/10/2007   Qualifier: Diagnosis of  By: Linna Darner MD, Gwyndolyn Saxon    . Colitis   . Edema   . Gout   . Hearing loss   . Herpes zoster without mention of complication   . Hyperlipidemia   .  Hypertension   . Hyperuricemia   . Impacted cerumen   . Migraine   . Migraine   . Murmur   . Olecranon bursitis   . OSA (obstructive sleep apnea)   . Other and unspecified hyperlipidemia   . Other malaise and fatigue   . Peripheral neuropathy   . Polyneuropathy   . Pulmonary hypertension ([place])   . Unspecified hereditary and idiopathic peripheral neuropathy   . Venous insufficiency     SURGICAL HISTORY: Past Surgical History:  Procedure Laterality Date  . ANGIOPLASTY     w/ 2 stents in 1999  . APPENDECTOMY    . HEMORRHOID SURGERY    . LIPOMA EXCISION    . TONSILLECTOMY      SOCIAL HISTORY: Social History   Socioeconomic History  . Marital status: Married    Spouse name: [name]  . Number of children: 2  . Years of education: college  . Highest education level: Not on file  Occupational History  . 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 Financial resource strain: Not on file  . Food insecurity:    Worry: Not on file    Inability: Not on file  . Transportation needs:    Medical: Not on file  Non-medical: Not on file  Tobacco Use  . Smoking status: Never Smoker  . Smokeless tobacco: Never Used  Substance and Sexual Activity  . Alcohol use: Yes    Alcohol/week: 0.0 oz    Comment: 2 drink before dinner vodka   . Drug use: No  . Sexual activity: Not on file  Lifestyle  . Physical activity:    Days per week: Not on file    Minutes per session: Not on file  . Stress: Not on file  Relationships  . Social connections:    Talks on phone: Not on file    Gets together: Not on file    Attends religious service: Not on file    Active member of club or organization: Not on file    Attends meetings of clubs or organizations: Not on file    Relationship status: Not on file  . Intimate partner violence:    Fear of current or ex partner: Not on file    Emotionally abused: Not on file    Physically  abused: Not on file    Forced sexual activity: Not on file  Other Topics Concern  . Not on file  Social History Narrative   Pt is married and lives with wife (suzanne). Patient finished  two years of college.    Pt has children.   Caffeine -two cups daily.   Left handed.   Retired Licensed conveyancer                     FAMILY HISTORY: Family History  Problem Relation Age of Onset  . Stroke Father        Died, 83  . High blood pressure Father   . Diabetes Father   . Other Mother        Died, 88  . Diabetes Brother        Died, 82  . Diabetes Sister        Living, 91  . Healthy Daughter   . Healthy Son     ALLERGIES:  is allergic to lisinopril and spironolactone.  MEDICATIONS:  Current Outpatient Medications  Medication Sig Dispense Refill  . allopurinol (ZYLOPRIM) 300 MG tablet Take 1 tablet (300 mg total) by mouth daily as needed (gout flare). 30 tablet 0  . Cholecalciferol (VITAMIN D3) 2000 UNITS capsule Take 1 capsule (2,000 Units total) by mouth daily.    . Flavocoxid (LIMBREL) 500 MG CAPS Take 500 mg by mouth daily.    Marland Kitchen gabapentin (NEURONTIN) 600 MG tablet Take 1 tablet (600 mg total) by mouth 2 (two) times daily. 60 tablet 0  . losartan (COZAAR) 100 MG tablet Take 1 tablet (100 mg total) by mouth daily. 90 tablet 3  . metoprolol (LOPRESSOR) 50 MG tablet Take 25 mg by mouth 2 (two) times daily. 1/2 tab by mouth two times a day.     . pravastatin (PRAVACHOL) 40 MG tablet Take 40 mg by mouth daily.      Marland Kitchen terazosin (HYTRIN) 10 MG capsule Take 1 capsule (10 mg total) by mouth at bedtime. 30 capsule 5   No current facility-administered medications for this visit.     REVIEW OF SYSTEMS:    10 Point review of Systems was done is negative except as noted above.  PHYSICAL EXAMINATION:  . Vitals:   04/22/18 1252  BP: (!) 176/68  Pulse: (!) 58  Resp: 18  Temp: 97.8 F (36.6 C)  SpO2: 98%   Filed Weights  04/22/18 1252  Weight: 180 lb (81.6 kg)   .Body  mass index is 29.05 kg/m.  GENERAL:alert, in no acute distress and comfortable SKIN: senile purpura on extremities  EYES: conjunctiva are pink and non-injected, sclera anicteric OROPHARYNX: MMM, no exudates, no oropharyngeal erythema or ulceration NECK: supple, no JVD LYMPH:  no palpable lymphadenopathy in the cervical, axillary or inguinal regions LUNGS: clear to auscultation b/l with normal respiratory effort HEART: regular rate & rhythm ABDOMEN:  normoactive bowel sounds , non tender, not distended. Extremity: no pedal edema PSYCH: alert & oriented x 3 with fluent speech NEURO: no focal motor/sensory deficits  LABORATORY DATA:  I have reviewed the data as listed  . CBC Latest Ref Rng & Units 03/19/2018 12/09/2017 10/15/2016  WBC 4.0 - 10.5 K/uL 5.1 5.3 4.8  Hemoglobin 13.0 - 17.0 g/dL 13.6 14.0 12.8(L)  Hematocrit 39.0 - 52.0 % 39.8 41.6 37.5(L)  Platelets 150.0 - 400.0 K/uL 101.0(L) 116.0(L) 124.0(L)    . CMP Latest Ref Rng & Units 03/19/2018 12/09/2017 10/15/2016  Glucose 70 - 99 mg/dL 103(H) 97 110(H)  BUN 6 - 23 mg/dL 26(H) 16 19  Creatinine 0.40 - 1.50 mg/dL 1.13 1.02 1.05  Sodium 135 - 145 mEq/L 143 142 143  Potassium 3.5 - 5.1 mEq/L 4.5 4.7 4.6  Chloride 96 - 112 mEq/L 107 107 108  CO2 19 - 32 mEq/L 31 31 29   Calcium 8.4 - 10.5 mg/dL 9.1 9.3 9.0  Total Protein 6.0 - 8.3 g/dL 6.1 6.3 6.0  Total Bilirubin 0.2 - 1.2 mg/dL 1.8(H) 1.5(H) 1.3(H)  Alkaline Phos 39 - 117 U/L 27(L) 27(L) 26(L)  AST 0 - 37 U/L 20 17 19   ALT 0 - 53 U/L 21 15 16      RADIOGRAPHIC STUDIES: I have personally reviewed the radiological images as listed and agreed with the findings in the report. US Abdomen Complete  Result Date: 04/09/2018 CLINICAL DATA:  Thrombocytopenia. EXAM: ABDOMEN ULTRASOUND COMPLETE COMPARISON:  No recent. FINDINGS: Gallbladder: No gallstones or wall thickening visualized. No sonographic Murphy sign noted by sonographer. Common bile duct: Diameter: 2.8 mm Liver: 2.8 cm thinly  septated cyst consistent benign cyst in the left lobe liver. Within normal limits in parenchymal echogenicity. Portal vein is patent on color Doppler imaging with normal direction of blood flow towards the liver. IVC: No abnormality visualized. Pancreas: Visualized portion unremarkable. Spleen: Size and appearance within normal limits. Right Kidney: Length: 10.3 cm. Simple cysts right kidney, the largest measures 3 cm. No hydronephrosis. Left Kidney: Length: 10.7 cm. Echogenicity within normal limits. No mass or hydronephrosis visualized. Abdominal aorta: No aneurysm visualized. Other findings: None. IMPRESSION: 1. 2.8 cm thinly septated cyst consistent benign cyst in the left lobe liver. No focal hepatic abnormality identified. Hepatic echogenicity and contour normal. Normal portal venous flow. No splenomegaly. 2.  Simple cysts right kidney. Electronically Signed   By: Marcello Moores  Register   On: 04/09/2018 12:20    ASSESSMENT & PLAN:  84 y.o. male with  1. Thrombocytopenia - Idiopathic thrombocytopenia of undetermined significance. PLAN -Discussed patient's most recent labs from 03/19/18, PLT at 101k. No anemia. No leukocytopenia.  -Patient's CBCs history reveals his PLT have been low since at least 2012, when they were at 94k. Stability over the past 7 years is indicated which is reassurring. -Low platelets could be attributable to mild, consistent ETOH consumption  -Minimize blood thinners -Senile purpura - discussed that the pt could wear compression sleeves. -Discussed that his senile purpura is not terribly worrisome -  Optimize Vitamin C intake - begin taking multivitamin -Recommend liberal application of skin moisturizer regularly, and also use Vitamin E oil OTC for skin strengthening  -Discussed that the patient's low platelets are mild and not worrisome for spontaneous bleeding -Would be happy to see the pt again if PLT drop beneath 70k, or if any other concerns develop    RTC with Dr. Irene Limbo as  needed  All of the patients questions were answered with apparent satisfaction. The patient knows to call the clinic with any problems, questions or concerns.  The total time spent in the appt was 30 minutes and more than 50% was on counseling and direct patient cares.     Sullivan Lone MD MS AAHIVMS [facility] Hematology/Oncology [facility]  (Office):       [phone] (Work cell):  [phone] (Fax):           [phone]  04/22/2018 1:58 PM  I, Baldwin [place], am acting as a Education administrator for Dr Irene Limbo.   .I have reviewed the above documentation for accuracy and completeness, and I agree with the above. Brunetta Genera MD

## 2018-04-22 ENCOUNTER — Inpatient Hospital Stay: Payer: Medicare Other | Attending: Hematology | Admitting: Hematology

## 2018-04-22 VITALS — BP 176/68 | HR 58 | Temp 97.8°F | Resp 18 | Ht 66.0 in | Wt 180.0 lb

## 2018-04-22 DIAGNOSIS — G4730 Sleep apnea, unspecified: Secondary | ICD-10-CM | POA: Diagnosis not present

## 2018-04-22 DIAGNOSIS — D696 Thrombocytopenia, unspecified: Secondary | ICD-10-CM | POA: Insufficient documentation

## 2018-04-22 DIAGNOSIS — I10 Essential (primary) hypertension: Secondary | ICD-10-CM | POA: Insufficient documentation

## 2018-04-22 DIAGNOSIS — I272 Pulmonary hypertension, unspecified: Secondary | ICD-10-CM

## 2018-04-22 DIAGNOSIS — R011 Cardiac murmur, unspecified: Secondary | ICD-10-CM | POA: Diagnosis not present

## 2018-04-22 DIAGNOSIS — E790 Hyperuricemia without signs of inflammatory arthritis and tophaceous disease: Secondary | ICD-10-CM | POA: Diagnosis not present

## 2018-04-22 DIAGNOSIS — E785 Hyperlipidemia, unspecified: Secondary | ICD-10-CM | POA: Diagnosis not present

## 2018-04-22 DIAGNOSIS — G629 Polyneuropathy, unspecified: Secondary | ICD-10-CM | POA: Insufficient documentation

## 2018-04-22 DIAGNOSIS — F419 Anxiety disorder, unspecified: Secondary | ICD-10-CM | POA: Diagnosis not present

## 2018-04-22 DIAGNOSIS — I350 Nonrheumatic aortic (valve) stenosis: Secondary | ICD-10-CM | POA: Diagnosis not present

## 2018-04-22 DIAGNOSIS — K7689 Other specified diseases of liver: Secondary | ICD-10-CM | POA: Diagnosis not present

## 2018-04-22 DIAGNOSIS — I2510 Atherosclerotic heart disease of native coronary artery without angina pectoris: Secondary | ICD-10-CM | POA: Diagnosis not present

## 2018-04-22 DIAGNOSIS — Z79899 Other long term (current) drug therapy: Secondary | ICD-10-CM | POA: Diagnosis not present

## 2018-04-22 DIAGNOSIS — M109 Gout, unspecified: Secondary | ICD-10-CM | POA: Insufficient documentation

## 2018-04-22 DIAGNOSIS — N281 Cyst of kidney, acquired: Secondary | ICD-10-CM | POA: Insufficient documentation

## 2018-04-22 NOTE — Patient Instructions (Signed)
Could use compression sleeves, from medical supply store or [place], for senile purpura (non-worrisome bruising). Take a multi-vitamin to assist the integrity of your skin Vitamin E oil OTC for skin strenghtening High-protein diet

## 2018-05-08 ENCOUNTER — Telehealth: Payer: Self-pay | Admitting: Emergency Medicine

## 2018-05-08 NOTE — Telephone Encounter (Signed)
Form received, placed in MDs folder to sign

## 2018-05-08 NOTE — Telephone Encounter (Signed)
Copied from [place] [id number]. Topic: General - Other >> May 07, 2018  8:56 AM Carolyn Stare wrote:  Marcie Mowers will fax over a medical necessity form for the pt,she said she faxed it on 05/03/18

## 2018-05-11 NOTE — Telephone Encounter (Signed)
Spoke with pt. Pt states he does not want to proceed with completing the order.

## 2018-05-11 NOTE — Telephone Encounter (Signed)
Copied from [place] [id number]. Topic: General - Other >> May 10, 2018 10:41 AM Gardiner Ramus wrote: Reason for CRM: ava calling from [employer] called and stated that she faxed over a letter of medical necessity and would like a call back if this was not received.  Please advise [phone]

## 2018-05-24 ENCOUNTER — Other Ambulatory Visit: Payer: Self-pay | Admitting: Cardiovascular Disease

## 2018-05-25 ENCOUNTER — Encounter: Payer: Self-pay | Admitting: Cardiovascular Disease

## 2018-06-10 NOTE — Progress Notes (Addendum)
Subjective:   Paul Mays is a 84 y.o. male who presents for Medicare Annual/Subsequent preventive examination.  Review of Systems:  No ROS.  Medicare Wellness Visit. Additional risk factors are reflected in the social history.  Cardiac Risk Factors include: advanced age (>76men, >30 women);dyslipidemia;hypertension;male gender Sleep patterns: feels rested on waking, does not get up to void, gets up 1-2 times nightly to void and sleeps 7 hours nightly.    Home Safety/Smoke Alarms: Feels safe in home. Smoke alarms in place.  Living environment; residence and Firearm Safety: apartment, no firearms. Lives with wife,  no needs for DME, good support system Seat Belt Safety/Bike Helmet: Wears seat belt.      Objective:    Vitals: BP (!) 158/80   Pulse (!) 50   Temp 97.8 F (36.6 C)   Resp 17   Ht 5\' 6"  (1.676 m)   Wt 181 lb (82.1 kg)   SpO2 98%   BMI 29.21 kg/m   Body mass index is 29.21 kg/m.  Advanced Directives 06/11/2018 11/07/2015  Does Patient Have a Medical Advance Directive? Yes Yes  Type of Paramedic of [place];Living will -  Copy of [place] in Chart? Yes Yes    Tobacco Social History   Tobacco Use  Smoking Status Never Smoker  Smokeless Tobacco Never Used     Counseling given: Not Answered  Past Medical History:  Diagnosis Date  . Anxiety   . Aortic stenosis   . Arthralgia   . Arthritis   . CAD (coronary artery disease)   . Carotid stenosis   . Cervicalgia   . CLUSTER HEADACHE 02/10/2007   Qualifier: Diagnosis of  By: Linna Darner MD, Paul Mays    . Colitis   . Edema   . Gout   . Hearing loss   . Herpes zoster without mention of complication   . Hyperlipidemia   . Hypertension   . Hyperuricemia   . Impacted cerumen   . Migraine   . Migraine   . Murmur   . Olecranon bursitis   . OSA (obstructive sleep apnea)   . Other and unspecified hyperlipidemia   . Other malaise and fatigue   . Peripheral neuropathy    . Polyneuropathy   . Pulmonary hypertension ([place])   . Unspecified hereditary and idiopathic peripheral neuropathy   . Venous insufficiency    Past Surgical History:  Procedure Laterality Date  . ANGIOPLASTY     w/ 2 stents in 1999  . APPENDECTOMY    . HEMORRHOID SURGERY    . LIPOMA EXCISION    . TONSILLECTOMY     Family History  Problem Relation Age of Onset  . Stroke Father        Died, 83  . High blood pressure Father   . Diabetes Father   . Other Mother        Died, 88  . Diabetes Brother        Died, 82  . Diabetes Sister        Living, 40  . Healthy Daughter   . Healthy Son    Social History   Socioeconomic History  . Marital status: Married    Spouse name: [name]  . Number of children: 2  . Years of education: college  . Highest education level: Not on file  Occupational History  . 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  Financial resource strain: Not hard at all  . Food insecurity:    Worry: Never true    Inability: Never true  . Transportation needs:    Medical: No    Non-medical: No  Tobacco Use  . Smoking status: Never Smoker  . Smokeless tobacco: Never Used  Substance and Sexual Activity  . Alcohol use: Yes    Alcohol/week: 0.0 standard drinks    Comment: 2 drink before dinner vodka   . Drug use: No  . Sexual activity: Never  Lifestyle  . Physical activity:    Days per week: 5 days    Minutes per session: 30 min  . Stress: Only a little  Relationships  . Social connections:    Talks on phone: More than three times a week    Gets together: More than three times a week    Attends religious service: 1 to 4 times per year    Active member of club or organization: Yes    Attends meetings of clubs or organizations: More than 4 times per year    Relationship status: Married  Other Topics Concern  . Not on file  Social History Narrative   Pt is married and lives with wife  (suzanne). Patient finished  two years of college.    Pt has children.   Caffeine -two cups daily.   Left handed.   Retired Licensed conveyancer                     Outpatient Encounter Medications as of 06/11/2018  Medication Sig  . allopurinol (ZYLOPRIM) 300 MG tablet Take 1 tablet (300 mg total) by mouth daily as needed (gout flare).  . Cholecalciferol (VITAMIN D3) 2000 UNITS capsule Take 1 capsule (2,000 Units total) by mouth daily.  . Flavocoxid (LIMBREL) 500 MG CAPS Take 500 mg by mouth daily.  Marland Kitchen gabapentin (NEURONTIN) 600 MG tablet Take 1 tablet (600 mg total) by mouth 2 (two) times daily.  Marland Kitchen losartan (COZAAR) 100 MG tablet Take 1 tablet (100 mg total) by mouth daily. Please keep upcoming appt in September before anymore refills. Thank you  . metoprolol (LOPRESSOR) 50 MG tablet Take 25 mg by mouth 2 (two) times daily. 1/2 tab by mouth two times a day.   . naproxen sodium (ALEVE) 220 MG tablet Take 220 mg by mouth.  . pravastatin (PRAVACHOL) 40 MG tablet Take 40 mg by mouth daily.    Marland Kitchen terazosin (HYTRIN) 10 MG capsule Take 1 capsule (10 mg total) by mouth at bedtime. (Patient taking differently: Take 12 mg by mouth at bedtime. )   No facility-administered encounter medications on file as of 06/11/2018.     Activities of Daily Living In your present state of health, do you have any difficulty performing the following activities: 06/11/2018  Hearing? N  Vision? N  Difficulty concentrating or making decisions? N  Walking or climbing stairs? N  Dressing or bathing? N  Doing errands, shopping? N  Preparing Food and eating ? N  Using the Toilet? N  In the past six months, have you accidently leaked urine? N  Do you have problems with loss of bowel control? N  Managing your Medications? N  Managing your Finances? N  Housekeeping or managing your Housekeeping? N  Some recent data might be hidden    Patient Care Team: Binnie Rail, MD as PCP - General (Internal Medicine)     Assessment:   This is a routine wellness examination for  Paul Mays. Physical assessment deferred to PCP.   Exercise Activities and Dietary recommendations Current Exercise Habits: Home exercise routine, Type of exercise: walking, Time (Minutes): 30, Frequency (Times/Week): 7, Weekly Exercise (Minutes/Week): 210, Intensity: Mild, Exercise limited by: orthopedic condition(s)  Diet (meal preparation, eat out, water intake, caffeinated beverages, dairy products, fruits and vegetables): in general, a "healthy" diet  , well balanced   Reviewed heart healthy diet. Encouraged patient to increase daily water and healthy fluid intake.   Goals      Patient Stated   . patient  (pt-stated)     Trying to maintain health; continue to walk dog;        Other   . Patient Stated     Stay as healthy and as independent as possible       Fall Risk Fall Risk  06/11/2018 09/04/2017 12/02/2016 08/06/2016 11/07/2015  Falls in the past year? No No No No No    Depression Screen PHQ 2/9 Scores 06/11/2018 09/04/2017 12/02/2016 11/07/2015  PHQ - 2 Score 1 0 0 0  PHQ- 9 Score 2 - - -    Cognitive Function MMSE - Mini Mental State Exam 06/11/2018  Orientation to time 5  Orientation to Place 5  Registration 3  Attention/ Calculation 5  Recall 3  Language- name 2 objects 2  Language- repeat 1  Language- follow 3 step command 3  Language- read & follow direction 1  Write a sentence 1  Copy design 1  Total score 30        Immunization History  Administered Date(s) Administered  . Influenza, High Dose Seasonal PF 06/30/2014, 07/04/2017  . Influenza,inj,Quad PF,6+ Mos 07/04/2017  . Influenza-Unspecified 07/06/2013, 06/07/2015, 07/08/2016  . Pneumococcal Conjugate-13 06/07/2015  . Pneumococcal Polysaccharide-23 12/05/2013, 07/08/2016  . Td 02/10/2015   Screening Tests Health Maintenance  Topic Date Due  . INFLUENZA VACCINE  05/06/2018  . TETANUS/TDAP  02/09/2025  . PNA vac Low Risk Adult  Completed       Plan:     Continue doing brain stimulating activities (puzzles, reading, adult coloring books, staying active) to keep memory sharp.   Continue to eat heart healthy diet (full of fruits, vegetables, whole grains, lean protein, water--limit salt, fat, and sugar intake) and increase physical activity as tolerated.  I have personally reviewed and noted the following in the patient's chart:   . Medical and social history . Use of alcohol, tobacco or illicit drugs  . Current medications and supplements . Functional ability and status . Nutritional status . Physical activity . Advanced directives . List of other physicians . Vitals . Screenings to include cognitive, depression, and falls . Referrals and appointments  In addition, I have reviewed and discussed with patient certain preventive protocols, quality metrics, and best practice recommendations. A written personalized care plan for preventive services as well as general preventive health recommendations were provided to patient.     Michiel Cowboy, RN  06/11/2018    Medical screening examination/treatment/procedure(s) were performed by non-physician practitioner and as supervising physician I was immediately available for consultation/collaboration. I agree with above. Binnie Rail, MD

## 2018-06-10 NOTE — Patient Instructions (Addendum)
  Medications reviewed and updated.  No changes recommended at this time.     Please followup in 6 months   

## 2018-06-10 NOTE — Progress Notes (Signed)
Subjective:    Patient ID: Paul Mays, male    DOB: [birth year], 84 y.o.   MRN: [id number]  HPI The patient is here for follow up.  Wants ears cleaned out - he has a lot of wax.  He needs them occasionally cleaned out.  He denies change in hearing or pain.  He just wants them checked to make sure they do not need to be cleaned out.  He does follow at the [place] and gets his medications filled there.  He was there recently.  CAD, HCM, Mild AS, Hypertension: He is taking his medication daily.  [facility] 10 mg daily to 12 mg daily.  He is monitoring his blood pressure at home and it is still elevated.  He has a follow-up with them in 1 month.  He is compliant with a low sodium diet.  He denies chest pain, palpitations, shortness of breath and regular headaches. He is not exercising regularly, except for walking the dog.     Hyperlipidemia: He is taking his medication daily. He is compliant with a low fat/cholesterol diet. He is not exercising regularly. He denies myalgias.   Gout:  He is taking allopurinol daily.  He denies gout symptoms since he was here last.    Peripheral neuropathy: Likely related to chronic alcohol consumption.  The gabapentin is effective.  He continues to bruise easily-he does not feel this is any worse or any better.  Medications and allergies reviewed with patient and updated if appropriate.  Patient Active Problem List   Diagnosis Date Noted  . Easy bruising 03/20/2018  . Decreased hearing of right ear 09/04/2017  . Rotator cuff tear arthropathy of both shoulders 10/18/2015  . Thrombocytopenia ([place]) 09/24/2015  . Diverticulosis of colon without hemorrhage 07/02/2014  . Hypertrophic cardiomyopathy ([place]) 03/24/2011  . OBSTRUCTIVE SLEEP APNEA 11/08/2010  . HYPERTENSION, PULMONARY 09/16/2010  . HEARING LOSS, BILATERAL 06/18/2009  . Coronary atherosclerosis 03/07/2009  . Aortic valve disorder 03/07/2009  . CAROTID STENOSIS  03/07/2009  . Hyperlipidemia 11/15/2008  . Peripheral neuropathy, idiopathic 11/15/2008  . Essential hypertension 11/15/2008  . ARTHRALGIA 11/15/2008  . COLITIS, HX OF 11/15/2008  . VENOUS INSUFFICIENCY 04/20/2007  . Edema 04/20/2007  . Gout 02/10/2007  . PSTPRC STATUS, PTCA 02/10/2007    Current Outpatient Medications on File Prior to Visit  Medication Sig Dispense Refill  . allopurinol (ZYLOPRIM) 300 MG tablet Take 1 tablet (300 mg total) by mouth daily as needed (gout flare). 30 tablet 0  . Cholecalciferol (VITAMIN D3) 2000 UNITS capsule Take 1 capsule (2,000 Units total) by mouth daily.    . Flavocoxid (LIMBREL) 500 MG CAPS Take 500 mg by mouth daily.    Marland Kitchen gabapentin (NEURONTIN) 600 MG tablet Take 1 tablet (600 mg total) by mouth 2 (two) times daily. 60 tablet 0  . losartan (COZAAR) 100 MG tablet Take 1 tablet (100 mg total) by mouth daily. Please keep upcoming appt in September before anymore refills. Thank you 90 tablet 0  . metoprolol (LOPRESSOR) 50 MG tablet Take 25 mg by mouth 2 (two) times daily. 1/2 tab by mouth two times a day.     . pravastatin (PRAVACHOL) 40 MG tablet Take 40 mg by mouth daily.      Marland Kitchen terazosin (HYTRIN) 10 MG capsule Take 1 capsule (10 mg total) by mouth at bedtime. (Patient taking differently: Take 12 mg by mouth at bedtime. ) 30 capsule 5   No current facility-administered  medications on file prior to visit.     Past Medical History:  Diagnosis Date  . Anxiety   . Aortic stenosis   . Arthralgia   . Arthritis   . CAD (coronary artery disease)   . Carotid stenosis   . Cervicalgia   . CLUSTER HEADACHE 02/10/2007   Qualifier: Diagnosis of  By: Linna Darner MD, Gwyndolyn Saxon    . Colitis   . Edema   . Gout   . Hearing loss   . Herpes zoster without mention of complication   . Hyperlipidemia   . Hypertension   . Hyperuricemia   . Impacted cerumen   . Migraine   . Migraine   . Murmur   . Olecranon bursitis   . OSA (obstructive sleep apnea)   . Other and  unspecified hyperlipidemia   . Other malaise and fatigue   . Peripheral neuropathy   . Polyneuropathy   . Pulmonary hypertension ([place])   . Unspecified hereditary and idiopathic peripheral neuropathy   . Venous insufficiency     Past Surgical History:  Procedure Laterality Date  . ANGIOPLASTY     w/ 2 stents in 1999  . APPENDECTOMY    . HEMORRHOID SURGERY    . LIPOMA EXCISION    . TONSILLECTOMY      Social History   Socioeconomic History  . Marital status: Married    Spouse name: [name]  . Number of children: 2  . Years of education: college  . Highest education level: Not on file  Occupational History  . 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 Financial resource strain: Not hard at all  . Food insecurity:    Worry: Never true    Inability: Never true  . Transportation needs:    Medical: No    Non-medical: No  Tobacco Use  . Smoking status: Never Smoker  . Smokeless tobacco: Never Used  Substance and Sexual Activity  . Alcohol use: Yes    Alcohol/week: 0.0 standard drinks    Comment: 2 drink before dinner vodka   . Drug use: No  . Sexual activity: Never  Lifestyle  . Physical activity:    Days per week: 5 days    Minutes per session: 30 min  . Stress: Only a little  Relationships  . Social connections:    Talks on phone: More than three times a week    Gets together: More than three times a week    Attends religious service: 1 to 4 times per year    Active member of club or organization: Yes    Attends meetings of clubs or organizations: More than 4 times per year    Relationship status: Married  Other Topics Concern  . Not on file  Social History Narrative   Pt is married and lives with wife (suzanne). Patient finished  two years of college.    Pt has children.   Caffeine -two cups daily.   Left handed.   Retired Licensed conveyancer                     Family History  Problem  Relation Age of Onset  . Stroke Father        Died, 83  . High blood pressure Father   . Diabetes Father   . Other Mother        Died, 88  . Diabetes Brother  Died, 82  . Diabetes Sister        Living, 32  . Healthy Daughter   . Healthy Son     Review of Systems  Constitutional: Negative for chills and fever.  Respiratory: Negative for cough, shortness of breath and wheezing.   Cardiovascular: Positive for leg swelling (mild). Negative for chest pain and palpitations.  Neurological: Positive for light-headedness (at times, mild). Negative for headaches.  Hematological: Bruises/bleeds easily (no change).       Objective:   Vitals:   06/11/18 1352  BP: (!) 158/80  Pulse: (!) 50  Temp: 97.8 F (36.6 C)  SpO2: 98%   BP Readings from Last 3 Encounters:  06/11/18 (!) 158/80  06/11/18 (!) 158/80  04/22/18 (!) 176/68   Wt Readings from Last 3 Encounters:  06/11/18 181 lb (82.1 kg)  06/11/18 181 lb (82.1 kg)  04/22/18 180 lb (81.6 kg)   Body mass index is 29.21 kg/m.   Physical Exam    Constitutional: Appears well-developed and well-nourished. No distress.  HENT:  Head: Normocephalic and atraumatic.  Neck: Neck supple. No tracheal deviation present. No thyromegaly present.  No cervical lymphadenopathy Cardiovascular: Normal rate, regular rhythm and normal heart sounds.   3/6 systolic murmur heard.   Trace bilateral lower extremity edema Pulmonary/Chest: Effort normal and breath sounds normal. No respiratory distress. No has no wheezes. No rales.  Skin: Skin is warm and dry. Not diaphoretic.  Several mild bruises bilateral arms Psychiatric: Normal mood and affect. Behavior is normal.      Assessment & Plan:    See Problem List for Assessment and Plan of chronic medical problems.

## 2018-06-11 ENCOUNTER — Ambulatory Visit (INDEPENDENT_AMBULATORY_CARE_PROVIDER_SITE_OTHER): Payer: Medicare Other | Admitting: *Deleted

## 2018-06-11 ENCOUNTER — Telehealth: Payer: Self-pay | Admitting: *Deleted

## 2018-06-11 ENCOUNTER — Encounter: Payer: Self-pay | Admitting: Internal Medicine

## 2018-06-11 ENCOUNTER — Ambulatory Visit (INDEPENDENT_AMBULATORY_CARE_PROVIDER_SITE_OTHER): Payer: Medicare Other | Admitting: Internal Medicine

## 2018-06-11 VITALS — BP 158/80 | HR 50 | Temp 97.8°F | Ht 66.0 in | Wt 181.0 lb

## 2018-06-11 VITALS — BP 158/80 | HR 50 | Temp 97.8°F | Resp 17 | Ht 66.0 in | Wt 181.0 lb

## 2018-06-11 DIAGNOSIS — I2510 Atherosclerotic heart disease of native coronary artery without angina pectoris: Secondary | ICD-10-CM

## 2018-06-11 DIAGNOSIS — Z0000 Encounter for general adult medical examination without abnormal findings: Secondary | ICD-10-CM

## 2018-06-11 DIAGNOSIS — M109 Gout, unspecified: Secondary | ICD-10-CM

## 2018-06-11 DIAGNOSIS — G609 Hereditary and idiopathic neuropathy, unspecified: Secondary | ICD-10-CM

## 2018-06-11 DIAGNOSIS — R238 Other skin changes: Secondary | ICD-10-CM

## 2018-06-11 DIAGNOSIS — E7849 Other hyperlipidemia: Secondary | ICD-10-CM

## 2018-06-11 DIAGNOSIS — R233 Spontaneous ecchymoses: Secondary | ICD-10-CM

## 2018-06-11 DIAGNOSIS — Z23 Encounter for immunization: Secondary | ICD-10-CM | POA: Diagnosis not present

## 2018-06-11 DIAGNOSIS — I10 Essential (primary) hypertension: Secondary | ICD-10-CM

## 2018-06-11 NOTE — Telephone Encounter (Signed)
Called and spoke to patient to ask if he could come for his AWV about 15 minutes earlier than scheduled time. This would ensure there is enough time to adequately cover the topics of the Wellness visit before his scheduled appointment with PCP at 1330. Patient will arrive at 1245.

## 2018-06-11 NOTE — Assessment & Plan Note (Signed)
Blood pressure is elevated here today and has been elevated recently He was just seen at the [place] and his terazosin was increased and he has not been on the higher dose long [facility] so I will not make any changes today He also recently had blood work done at the [place] so we will not do any additional blood work today

## 2018-06-11 NOTE — Patient Instructions (Addendum)
Continue doing brain stimulating activities (puzzles, reading, adult coloring books, staying active) to keep memory sharp.   Continue to eat heart healthy diet (full of fruits, vegetables, whole grains, lean protein, water--limit salt, fat, and sugar intake) and increase physical activity as tolerated.   Shoulder Range of Motion Exercises Shoulder range of motion (ROM) exercises are designed to keep the shoulder moving freely. They are often recommended for people who have shoulder pain. Phase 1 exercises When you are able, do this exercise 5-6 days per week, or as told by your health care provider. Work toward doing 2 sets of 10 swings. Pendulum Exercise How To Do This Exercise Lying Down 1. Lie face-down on a bed with your abdomen close to the side of the bed. 2. Let your arm hang over the side of the bed. 3. Relax your shoulder, arm, and hand. 4. Slowly and gently swing your arm forward and back. Do not use your neck muscles to swing your arm. They should be relaxed. If you are struggling to swing your arm, have someone gently swing it for you. When you do this exercise for the first time, swing your arm at a 15 degree angle for 15 seconds, or swing your arm 10 times. As pain lessens over time, increase the angle of the swing to 30-45 degrees. 5. Repeat steps 1-4 with the other arm.  How To Do This Exercise While Standing 1. Stand next to a sturdy chair or table and hold on to it with your hand. 1. Bend forward at the waist. 2. Bend your knees slightly. 3. Relax your other arm and let it hang limp. 4. Relax the shoulder blade of the arm that is hanging and let it drop. 5. While keeping your shoulder relaxed, use body motion to swing your arm in small circles. The first time you do this exercise, swing your arm for about 30 seconds or 10 times. When you do it next time, swing your arm for a little longer. 6. Stand up tall and relax. 7. Repeat steps 1-7, this time changing the direction of  the circles. 2. Repeat steps 1-8 with the other arm.  Phase 2 exercises Do these exercises 3-4 times per day on 5-6 days per week or as told by your health care provider. Work toward holding the stretch for 20 seconds. Stretching Exercise 1 1. Lift your arm straight out in front of you. 2. Bend your arm 90 degrees at the elbow (right angle) so your forearm goes across your body and looks like the letter "L." 3. Use your other arm to gently pull the elbow forward and across your body. 4. Repeat steps 1-3 with the other arm. Stretching Exercise 2 You will need a towel or rope for this exercise. 1. Bend one arm behind your back with the palm facing outward. 2. Hold a towel with your other hand. 3. Reach the arm that holds the towel above your head, and bend that arm at the elbow. Your wrist should be behind your neck. 4. Use your free hand to grab the free end of the towel. 5. With the higher hand, gently pull the towel up behind you. 6. With the lower hand, pull the towel down behind you. 7. Repeat steps 1-6 with the other arm.  Phase 3 exercises Do each of these exercises at four different times of day (sessions) every day or as told by your health care provider. To begin with, repeat each exercise 5 times (repetitions). Work toward  doing 3 sets of 12 repetitions or as told by your health care provider. Strengthening Exercise 1 You will need a light weight for this activity. As you grow stronger, you may use a heavier weight. 1. Standing with a weight in your hand, lift your arm straight out to the side until it is at the same height as your shoulder. 2. Bend your arm at 90 degrees so that your fingers are pointing to the ceiling. 3. Slowly raise your hand until your arm is straight up in the air. 4. Repeat steps 1-3 with the other arm.  Strengthening Exercise 2 You will need a light weight for this activity. As you grow stronger, you may use a heavier weight. 1. Standing with a weight  in your hand, gradually move your straight arm in an arc, starting at your side, then out in front of you, then straight up over your head. 2. Gradually move your other arm in an arc, starting at your side, then out in front of you, then straight up over your head. 3. Repeat steps 1-2 with the other arm.  Strengthening Exercise 3 You will need an elastic band for this activity. As you grow stronger, gradually increase the size of the bands or increase the number of bands that you use at one time. 1. While standing, hold an elastic band in one hand and raise that arm up in the air. 2. With your other hand, pull down the band until that hand is by your side. 3. Repeat steps 1-2 with the other arm.  This information is not intended to replace advice given to you by your health care provider. Make sure you discuss any questions you have with your health care provider. Document Released: 06/21/2003 Document Revised: 05/18/2016 Document Reviewed: 09/18/2014 Elsevier Interactive Patient Education  2018 [place] Maintenance, Male A healthy lifestyle and preventive care is important for your health and wellness. Ask your health care provider about what schedule of regular examinations is right for you. What should I know about weight and diet? Eat a Healthy Diet  Eat plenty of vegetables, fruits, whole grains, low-fat dairy products, and lean protein.  Do not eat a lot of foods high in solid fats, added sugars, or salt.  Maintain a Healthy Weight Regular exercise can help you achieve or maintain a healthy weight. You should:  Do at least 150 minutes of exercise each week. The exercise should increase your heart rate and make you sweat (moderate-intensity exercise).  Do strength-training exercises at least twice a week.  Watch Your Levels of Cholesterol and Blood Lipids  Have your blood tested for lipids and cholesterol every 5 years starting at 82 years of age. If you are at  high risk for heart disease, you should start having your blood tested when you are 82 years old. You may need to have your cholesterol levels checked more often if: ? Your lipid or cholesterol levels are high. ? You are older than 82 years of age. ? You are at high risk for heart disease.  What should I know about cancer screening? Many types of cancers can be detected early and may often be prevented. Lung Cancer  You should be screened every year for lung cancer if: ? You are a current smoker who has smoked for at least 30 years. ? You are a former smoker who has quit within the past 15 years.  Talk to your health care provider about your screening options,  when you should start screening, and how often you should be screened.  Colorectal Cancer  Routine colorectal cancer screening usually begins at 82 years of age and should be repeated every 5-10 years until you are 82 years old. You may need to be screened more often if early forms of precancerous polyps or small growths are found. Your health care provider may recommend screening at an earlier age if you have risk factors for colon cancer.  Your health care provider may recommend using home test kits to check for hidden blood in the stool.  A small camera at the end of a tube can be used to examine your colon (sigmoidoscopy or colonoscopy). This checks for the earliest forms of colorectal cancer.  Prostate and Testicular Cancer  Depending on your age and overall health, your health care provider may do certain tests to screen for prostate and testicular cancer.  Talk to your health care provider about any symptoms or concerns you have about testicular or prostate cancer.  Skin Cancer  Check your skin from head to toe regularly.  Tell your health care provider about any new moles or changes in moles, especially if: ? There is a change in a mole's size, shape, or color. ? You have a mole that is larger than a pencil  eraser.  Always use sunscreen. Apply sunscreen liberally and repeat throughout the day.  Protect yourself by wearing long sleeves, pants, a wide-brimmed hat, and sunglasses when outside.  What should I know about heart disease, diabetes, and high blood pressure?  If you are 17-98 years of age, have your blood pressure checked every 3-5 years. If you are 55 years of age or older, have your blood pressure checked every year. You should have your blood pressure measured twice-once when you are at a hospital or clinic, and once when you are not at a hospital or clinic. Record the average of the two measurements. To check your blood pressure when you are not at a hospital or clinic, you can use: ? An automated blood pressure machine at a pharmacy. ? A home blood pressure monitor.  Talk to your health care provider about your target blood pressure.  If you are between 4-74 years old, ask your health care provider if you should take aspirin to prevent heart disease.  Have regular diabetes screenings by checking your fasting blood sugar level. ? If you are at a normal weight and have a low risk for diabetes, have this test once every three years after the age of 14. ? If you are overweight and have a high risk for diabetes, consider being tested at a younger age or more often.  A one-time screening for abdominal aortic aneurysm (AAA) by ultrasound is recommended for men aged 71-75 years who are current or former smokers. What should I know about preventing infection? Hepatitis B If you have a higher risk for hepatitis B, you should be screened for this virus. Talk with your health care provider to find out if you are at risk for hepatitis B infection. Hepatitis C Blood testing is recommended for:  Everyone born from [birth year] through 1963.  Anyone with known risk factors for hepatitis C.  Sexually Transmitted Diseases (STDs)  You should be screened each year for STDs including gonorrhea and  chlamydia if: ? You are sexually active and are younger than 82 years of age. ? You are older than 82 years of age and your health care provider tells you that you  are at risk for this type of infection. ? Your sexual activity has changed since you were last screened and you are at an increased risk for chlamydia or gonorrhea. Ask your health care provider if you are at risk.  Talk with your health care provider about whether you are at high risk of being infected with HIV. Your health care provider may recommend a prescription medicine to help prevent HIV infection.  What else can I do?  Schedule regular health, dental, and eye exams.  Stay current with your vaccines (immunizations).  Do not use any tobacco products, such as cigarettes, chewing tobacco, and e-cigarettes. If you need help quitting, ask your health care provider.  Limit alcohol intake to no more than 2 drinks per day. One drink equals 12 ounces of beer, 5 ounces of Chima Astorino, or 1 ounces of hard liquor.  Do not use street drugs.  Do not share needles.  Ask your health care provider for help if you need support or information about quitting drugs.  Tell your health care provider if you often feel depressed.  Tell your health care provider if you have ever been abused or do not feel safe at home. This information is not intended to replace advice given to you by your health care provider. Make sure you discuss any questions you have with your health care provider. Document Released: 03/20/2008 Document Revised: 05/21/2016 Document Reviewed: 06/26/2015 Elsevier Interactive Patient Education  2018 [place]. Influenza Virus Vaccine injection What is this medicine? INFLUENZA VIRUS VACCINE (in floo EN zuh VAHY ruhs vak SEEN) helps to reduce the risk of getting influenza also known as the flu. The vaccine only helps protect you against some strains of the flu. This medicine may be used for other purposes; ask your health care  provider or pharmacist if you have questions. COMMON BRAND NAME(S): Afluria, Agriflu, Alfuria, FLUAD, Fluarix, Fluarix Quadrivalent, Flublok, Flublok Quadrivalent, FLUCELVAX, Flulaval, Fluvirin, Fluzone, Fluzone High-Dose, Fluzone Intradermal What should I tell my health care provider before I take this medicine? They need to know if you have any of these conditions: -bleeding disorder like hemophilia -fever or infection -Guillain-Barre syndrome or other neurological problems -immune system problems -infection with the human immunodeficiency virus (HIV) or AIDS -low blood platelet counts -multiple sclerosis -an unusual or allergic reaction to influenza virus vaccine, latex, other medicines, foods, dyes, or preservatives. Different brands of vaccines contain different allergens. Some may contain latex or eggs. Talk to your doctor about your allergies to make sure that you get the right vaccine. -pregnant or trying to get pregnant -breast-feeding How should I use this medicine? This vaccine is for injection into a muscle or under the skin. It is given by a health care professional. A copy of Vaccine Information Statements will be given before each vaccination. Read this sheet carefully each time. The sheet may change frequently. Talk to your healthcare provider to see which vaccines are right for you. Some vaccines should not be used in all age groups. Overdosage: If you think you have taken too much of this medicine contact a poison control center or emergency room at once. NOTE: This medicine is only for you. Do not share this medicine with others. What if I miss a dose? This does not apply. What may interact with this medicine? -chemotherapy or radiation therapy -medicines that lower your immune system like etanercept, anakinra, infliximab, and adalimumab -medicines that treat or prevent blood clots like warfarin -phenytoin -steroid medicines like prednisone or  cortisone -  theophylline -vaccines This list may not describe all possible interactions. Give your health care provider a list of all the medicines, herbs, non-prescription drugs, or dietary supplements you use. Also tell them if you smoke, drink alcohol, or use illegal drugs. Some items may interact with your medicine. What should I watch for while using this medicine? Report any side effects that do not go away within 3 days to your doctor or health care professional. Call your health care provider if any unusual symptoms occur within 6 weeks of receiving this vaccine. You may still catch the flu, but the illness is not usually as bad. You cannot get the flu from the vaccine. The vaccine will not protect against colds or other illnesses that may cause fever. The vaccine is needed every year. What side effects may I notice from receiving this medicine? Side effects that you should report to your doctor or health care professional as soon as possible: -allergic reactions like skin rash, itching or hives, swelling of the face, lips, or tongue Side effects that usually do not require medical attention (report to your doctor or health care professional if they continue or are bothersome): -fever -headache -muscle aches and pains -pain, tenderness, redness, or swelling at the injection site -tiredness This list may not describe all possible side effects. Call your doctor for medical advice about side effects. You may report side effects to FDA at 1-800-FDA-1088. Where should I keep my medicine? The vaccine will be given by a health care professional in a clinic, pharmacy, doctor's office, or other health care setting. You will not be given vaccine doses to store at home. NOTE: This sheet is a summary. It may not cover all possible information. If you have questions about this medicine, talk to your doctor, pharmacist, or health care provider.  2018 Elsevier/Gold Standard (2015-04-13 10:07:28)

## 2018-06-11 NOTE — Assessment & Plan Note (Signed)
Has not had any gout symptoms since he was here last Continue allopurinol 300 mg daily

## 2018-06-11 NOTE — Assessment & Plan Note (Signed)
Stable without change We will monitor

## 2018-06-11 NOTE — Assessment & Plan Note (Signed)
Taking gabapentin twice daily, which is effective Tolerating medication well Continue both dose

## 2018-06-11 NOTE — Assessment & Plan Note (Signed)
Taking pravastatin Cholesterol has been well controlled He just had blood work done at the [place] so no additional blood work today [employer] the dog for exercise Continue heart healthy diet

## 2018-06-11 NOTE — Assessment & Plan Note (Signed)
No concerning chest pain, palpitations or shortness of breath Following with cardiology Continue current medications

## 2018-06-17 ENCOUNTER — Ambulatory Visit (INDEPENDENT_AMBULATORY_CARE_PROVIDER_SITE_OTHER): Payer: Medicare Other | Admitting: Cardiovascular Disease

## 2018-06-17 ENCOUNTER — Encounter: Payer: Self-pay | Admitting: Cardiovascular Disease

## 2018-06-17 VITALS — BP 124/58 | HR 52 | Ht 66.0 in | Wt 180.0 lb

## 2018-06-17 DIAGNOSIS — I10 Essential (primary) hypertension: Secondary | ICD-10-CM | POA: Diagnosis not present

## 2018-06-17 DIAGNOSIS — E782 Mixed hyperlipidemia: Secondary | ICD-10-CM

## 2018-06-17 DIAGNOSIS — I2510 Atherosclerotic heart disease of native coronary artery without angina pectoris: Secondary | ICD-10-CM

## 2018-06-17 NOTE — Patient Instructions (Signed)

## 2018-06-17 NOTE — Progress Notes (Signed)
Cardiology Office Note:    Date:  06/17/2018   ID:  Paul Mays, DOB [birth year], MRN [id number]  PCP:  Binnie Rail, MD  Cardiologist:  Sherren Mocha, MD  Electrophysiologist:  None   Referring MD: Binnie Rail, MD   Chief Complaint  Patient presents with  . Coronary Artery Disease    History of Present Illness:    Paul MCCLENAHAN is a 84 y.o. male with a hx of CAD with remote PCI, hypertension, and LVH with septal hypertrophy/LV outflow obstruction.  He is here alone today for follow-up evaluation.  He has been feeling well.  He has occasional dizziness but describes this as mild.  He has not had near syncope or frank syncope.  He denies chest pain or shortness of breath.  No leg swelling, orthopnea, or PND.  Recently had increased blood pressure when seen at the [facility] and terazosin and was increased.  Past Medical History:  Diagnosis Date  . Anxiety   . Aortic stenosis   . Arthralgia   . Arthritis   . CAD (coronary artery disease)   . Carotid stenosis   . Cervicalgia   . CLUSTER HEADACHE 02/10/2007   Qualifier: Diagnosis of  By: Linna Darner MD, Gwyndolyn Saxon    . Colitis   . Edema   . Gout   . Hearing loss   . Herpes zoster without mention of complication   . Hyperlipidemia   . Hypertension   . Hyperuricemia   . Impacted cerumen   . Migraine   . Migraine   . Murmur   . Olecranon bursitis   . OSA (obstructive sleep apnea)   . Other and unspecified hyperlipidemia   . Other malaise and fatigue   . Peripheral neuropathy   . Polyneuropathy   . Pulmonary hypertension ([place])   . Unspecified hereditary and idiopathic peripheral neuropathy   . Venous insufficiency     Past Surgical History:  Procedure Laterality Date  . ANGIOPLASTY     w/ 2 stents in 1999  . APPENDECTOMY    . HEMORRHOID SURGERY    . LIPOMA EXCISION    . TONSILLECTOMY      Current Medications: Current Meds  Medication Sig  . allopurinol (ZYLOPRIM) 300 MG tablet Take 1 tablet (300 mg total) by mouth  daily as needed (gout flare).  . Cholecalciferol (VITAMIN D3) 2000 UNITS capsule Take 1 capsule (2,000 Units total) by mouth daily.  . Flavocoxid (LIMBREL) 500 MG CAPS Take 500 mg by mouth daily.  Marland Kitchen gabapentin (NEURONTIN) 600 MG tablet Take 1 tablet (600 mg total) by mouth 2 (two) times daily.  Marland Kitchen losartan (COZAAR) 100 MG tablet Take 1 tablet (100 mg total) by mouth daily. Please keep upcoming appt in September before anymore refills. Thank you  . metoprolol (LOPRESSOR) 50 MG tablet Take 25 mg by mouth 2 (two) times daily. 1/2 tab by mouth two times a day.   . naproxen sodium (ALEVE) 220 MG tablet Take 220 mg by mouth.  . pravastatin (PRAVACHOL) 40 MG tablet Take 40 mg by mouth daily.    Marland Kitchen terazosin (HYTRIN) 10 MG capsule Take 12 mg by mouth at bedtime.     Allergies:   Lisinopril and Spironolactone   Social History   Socioeconomic History  . Marital status: Married    Spouse name: Paul Mays  . Number of children: 2  . Years of education: college  . Highest education Mays: Not on file  Occupational History  . 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 Financial resource strain: Not hard at all  . Food insecurity:    Worry: Never true    Inability: Never true  . Transportation needs:    Medical: No    Non-medical: No  Tobacco Use  . Smoking status: Never Smoker  . Smokeless tobacco: Never Used  Substance and Sexual Activity  . Alcohol use: Yes    Alcohol/week: 0.0 standard drinks    Comment: 2 drink before dinner vodka   . Drug use: No  . Sexual activity: Never  Lifestyle  . Physical activity:    Days per week: 5 days    Minutes per session: 30 min  . Stress: Only a little  Relationships  . Social connections:    Talks on phone: More than three times a week    Gets together: More than three times a week    Attends religious service: 1 to 4 times per year    Active member of club or organization: Yes     Attends meetings of clubs or organizations: More than 4 times per year    Relationship status: Married  Other Topics Concern  . Not on file  Social History Narrative   Pt is married and lives with wife (suzanne). Patient finished  two years of college.    Pt has children.   Caffeine -two cups daily.   Left handed.   Retired Licensed conveyancer                      Family History: The patient's family history includes Diabetes in his brother, father, and sister; Healthy in his daughter and son; High blood pressure in his father; Other in his mother; Stroke in his father.  ROS:   Please see the history of present illness.    All other systems reviewed and are negative.  EKGs/Labs/Other Studies Reviewed:    The following studies were reviewed today: 2D echocardiogram 03/15/2015: Study Conclusions  - Left ventricle: LVOT is narrow. There is turbulent flow through   the LVOT at rest . Peak and mean gradients through the LVOT/AV   are 25 and 14 mm Hg respectively The cavity size was normal. Wall   thickness was increased in a pattern of mild LVH. Systolic   function was vigorous. The estimated ejection fraction was in the   range of 65% to 70%. Doppler parameters are consistent with   abnormal left ventricular relaxation (grade 1 diastolic   dysfunction). - Aortic valve: AV is thickened with minimally restricted motion.   There was mild regurgitation. - Mitral valve: Mild systolic anterior motion of the anteior mitral   leaflet. Calcified annulus. Mildly thickened leaflets . - Left atrium: The atrium was severely dilated. - Pulmonary arteries: PA peak pressure: 51 mm Hg (S).  EKG:  EKG is ordered today.  The ekg ordered today demonstrates sinus bradycardia 52 bpm, first-degree AV block, otherwise within normal limits.  Recent Labs: 03/19/2018: ALT 21; BUN 26; Creatinine, Ser 1.13; Hemoglobin 13.6; Platelets 101.0; Potassium 4.5; Sodium 143  Recent Lipid Panel    Component Value  Date/Time   CHOL 149 12/09/2017 1457   TRIG 57.0 12/09/2017 1457   HDL 72.30 12/09/2017 1457   CHOLHDL 2 12/09/2017 1457   VLDL 11.4 12/09/2017 1457   LDLCALC 65 12/09/2017 1457    Physical Exam:    VS:  BP (!) 124/58   Pulse Marland Kitchen)  52   Ht 5\' 6"  (1.676 m)   Wt 180 lb (81.6 kg)   BMI 29.05 kg/m     Wt Readings from Last 3 Encounters:  06/17/18 180 lb (81.6 kg)  06/11/18 181 lb (82.1 kg)  06/11/18 181 lb (82.1 kg)     GEN: Well nourished, well developed elderly male in no acute distress HEENT: Normal NECK: No JVD; No carotid bruits LYMPHATICS: No lymphadenopathy CARDIAC: RRR, 2/6 mid peaking systolic murmur heard best at the left lower sternal border RESPIRATORY:  Clear to auscultation without rales, wheezing or rhonchi  ABDOMEN: Soft, non-tender, non-distended MUSCULOSKELETAL: Trace bilateral pretibial edema; No deformity  SKIN: Warm and dry NEUROLOGIC:  Alert and oriented x 3 PSYCHIATRIC:  Normal affect   ASSESSMENT:    1. Essential hypertension   2. Coronary artery disease involving native coronary artery of native heart without angina pectoris   3. Mixed hyperlipidemia    PLAN:    In order of problems listed above:  1. Blood pressure is well controlled on current medications.  I reviewed his most recent lab work.  He will continue on the same antihypertensive medical program. 2. The patient is not experience any anginal symptoms.  He takes aspirin 81 mg daily, a beta-blocker, and a statin drug. 3. Most recent lipids are reviewed and LDL cholesterol is at goal at 65 mg/dL.   Medication Adjustments/Labs and Tests Ordered: Current medicines are reviewed at length with the patient today.  Concerns regarding medicines are outlined above.  Orders Placed This Encounter  Procedures  . EKG 12-Lead   No orders of the defined types were placed in this encounter.   Patient Instructions  Medication Instructions:  Your provider recommends that you continue on your  current medications as directed. Please refer to the Current Medication list given to you today.    Labwork: None  Testing/Procedures: None  Follow-Up: Your provider wants you to follow-up in: 1 year with Dr. Burt Knack. You will receive a reminder letter in the mail two months in advance. If you don't receive a letter, please call our office to schedule the follow-up appointment.    Any Other Special Instructions Will Be Listed Below (If Applicable).     If you need a refill on your cardiac medications before your next appointment, please call your pharmacy.      Signed, Sherren Mocha, MD  06/17/2018 3:10 PM    [place]

## 2018-06-30 ENCOUNTER — Ambulatory Visit: Payer: Medicare Other | Admitting: Family Medicine

## 2018-06-30 NOTE — Progress Notes (Signed)
[facility] [address] [place], [place] [address] Phone: [phone] Subjective:    I Paul Mays am serving as a Education administrator for Dr. Hulan Saas.   CC: Bilateral shoulder pain  PIR:[id number]  Paul Mays is a 84 y.o. male coming in with complaint of left shoulder pain. Wants bilateral injections today. Believes the last injection didn't work.  Patient has responded somewhat to the injections previously.  Patient since the last injections did not help as much as usual.  Patient is having more pain that is waking him up at night.  Has been unfortunately having to do more heavy lifting around the house with his wife now wheelchair-bound.    Past Medical History:  Diagnosis Date  . Anxiety   . Aortic stenosis   . Arthralgia   . Arthritis   . CAD (coronary artery disease)   . Carotid stenosis   . Cervicalgia   . CLUSTER HEADACHE 02/10/2007   Qualifier: Diagnosis of  By: Linna Darner MD, Gwyndolyn Saxon    . Colitis   . Edema   . Gout   . Hearing loss   . Herpes zoster without mention of complication   . Hyperlipidemia   . Hypertension   . Hyperuricemia   . Impacted cerumen   . Migraine   . Migraine   . Murmur   . Olecranon bursitis   . OSA (obstructive sleep apnea)   . Other and unspecified hyperlipidemia   . Other malaise and fatigue   . Peripheral neuropathy   . Polyneuropathy   . Pulmonary hypertension ([place])   . Unspecified hereditary and idiopathic peripheral neuropathy   . Venous insufficiency    Past Surgical History:  Procedure Laterality Date  . ANGIOPLASTY     w/ 2 stents in 1999  . APPENDECTOMY    . HEMORRHOID SURGERY    . LIPOMA EXCISION    . TONSILLECTOMY     Social History   Socioeconomic History  . Marital status: Married    Spouse name: [name]  . Number of children: 2  . Years of education: college  . Highest education level: Not on file  Occupational History  . Occupation: retired    Comment: worked as a Acupuncturist: RETIRED  Social Needs  . Financial resource strain: Not hard at all  . Food insecurity:    Worry: Never true    Inability: Never true  . Transportation needs:    Medical: No    Non-medical: No  Tobacco Use  . Smoking status: Never Smoker  . Smokeless tobacco: Never Used  Substance and Sexual Activity  . Alcohol use: Yes    Alcohol/week: 0.0 standard drinks    Comment: 2 drink before dinner vodka   . Drug use: No  . Sexual activity: Never  Lifestyle  . Physical activity:    Days per week: 5 days    Minutes per session: 30 min  . Stress: Only a little  Relationships  . Social connections:    Talks on phone: More than three times a week    Gets together: More than three times a week    Attends religious service: 1 to 4 times per year    Active member of club or organization: Yes    Attends meetings of clubs or organizations: More than 4 times per year    Relationship status: Married  Other Topics Concern  . Not on file  Social History Narrative  Pt is married and lives with wife (suzanne). Patient finished  two years of college.    Pt has children.   Caffeine -two cups daily.   Left handed.   Retired Licensed conveyancer                    Allergies  Allergen Reactions  . Lisinopril Other (See Comments)    Hyperkalemia in the context of combined spironolactone and lisinopril therapy  . Spironolactone Other (See Comments)    Hyperkalemia in context of spironolactone and lisinopril therapy   Family History  Problem Relation Age of Onset  . Stroke Father        Died, 83  . High blood pressure Father   . Diabetes Father   . Other Mother        Died, 88  . Diabetes Brother        Died, 82  . Diabetes Sister        Living, 64  . Healthy Daughter   . Healthy Son      Current Outpatient Medications (Cardiovascular):  .  losartan (COZAAR) 100 MG tablet, Take 1 tablet (100 mg total) by mouth daily. Please keep upcoming appt in September  before anymore refills. Thank you .  metoprolol (LOPRESSOR) 50 MG tablet, Take 25 mg by mouth 2 (two) times daily. 1/2 tab by mouth two times a day.  .  pravastatin (PRAVACHOL) 40 MG tablet, Take 40 mg by mouth daily.   Marland Kitchen  terazosin (HYTRIN) 10 MG capsule, Take 12 mg by mouth at bedtime.   Current Outpatient Medications (Analgesics):  .  allopurinol (ZYLOPRIM) 300 MG tablet, Take 1 tablet (300 mg total) by mouth daily as needed (gout flare). .  naproxen sodium (ALEVE) 220 MG tablet, Take 220 mg by mouth.   Current Outpatient Medications (Other):  Marland Kitchen  Cholecalciferol (VITAMIN D3) 2000 UNITS capsule, Take 1 capsule (2,000 Units total) by mouth daily. .  Flavocoxid (LIMBREL) 500 MG CAPS, Take 500 mg by mouth daily. Marland Kitchen  gabapentin (NEURONTIN) 600 MG tablet, Take 1 tablet (600 mg total) by mouth 2 (two) times daily.    Past medical history, social, surgical and family history all reviewed in electronic medical record.  No pertanent information unless stated regarding to the chief complaint.   Review of Systems:  No headache, visual changes, nausea, vomiting, diarrhea, constipation, dizziness, abdominal pain, skin rash, fevers, chills, night sweats, weight loss, swollen lymph nodes, body aches, joint swelling, chest pain, shortness of breath, mood changes.  Positive muscle aches  Objective  Blood pressure 120/60, pulse (!) 57, height 5\' 6"  (1.676 m), weight 179 lb (81.2 kg), SpO2 92 %.    General: No apparent distress alert and oriented x3 mood and affect normal, dressed appropriately.  HEENT: Pupils equal, extraocular movements intact  Respiratory: Patient's speak in full sentences and does not appear short of breath  Cardiovascular: No lower extremity edema, non tender, no erythema  Skin: Warm dry intact with no signs of infection or rash on extremities or on axial skeleton.  Abdomen: Soft nontender  Neuro: Cranial nerves II through XII are intact, neurovascularly intact in all extremities  with 2+ DTRs and 2+ pulses.  Lymph: No lymphadenopathy of posterior or anterior cervical chain or axillae bilaterally.  Gait antalgic MSK:  tender with limited range of motion and good stability and symmetric strength and tone of shoulders, elbows, wrist, hip, knee and ankles bilaterally.  Severe arthritic changes  Shoulder exam shows atrophy  noted.  Bilaterally significant crepitus.  Decreased range of motion in all planes.  Severely tender with range of motion.  After informed written and verbal consent, patient was seated on exam table. Right shoulder was prepped with alcohol swab and utilizing anterior approach, patient's right glenohumeral space was injected with 4:1  marcaine 0.5%: Kenalog 40mg /dL. Patient tolerated the procedure well without immediate complications.  After informed written and verbal consent, patient was seated on exam table. Left shoulder was prepped with alcohol swab and utilizing anterior approach, patient's right glenohumeral space was injected with 4:1  marcaine 0.5%: Kenalog 40mg /dL. Patient tolerated the procedure well without immediate complications.   Impression and Recommendations:     This case required medical decision making of moderate complexity. The above documentation has been reviewed and is accurate and complete Lyndal Pulley, DO       Note: This dictation was prepared with Dragon dictation along with smaller phrase technology. Any transcriptional errors that result from this process are unintentional.

## 2018-07-01 ENCOUNTER — Encounter: Payer: Self-pay | Admitting: Family Medicine

## 2018-07-01 ENCOUNTER — Ambulatory Visit (INDEPENDENT_AMBULATORY_CARE_PROVIDER_SITE_OTHER): Payer: Medicare Other | Admitting: Family Medicine

## 2018-07-01 DIAGNOSIS — M12811 Other specific arthropathies, not elsewhere classified, right shoulder: Secondary | ICD-10-CM

## 2018-07-01 DIAGNOSIS — I2510 Atherosclerotic heart disease of native coronary artery without angina pectoris: Secondary | ICD-10-CM

## 2018-07-01 DIAGNOSIS — M12812 Other specific arthropathies, not elsewhere classified, left shoulder: Secondary | ICD-10-CM

## 2018-07-01 DIAGNOSIS — M75102 Unspecified rotator cuff tear or rupture of left shoulder, not specified as traumatic: Secondary | ICD-10-CM | POA: Diagnosis not present

## 2018-07-01 DIAGNOSIS — M75101 Unspecified rotator cuff tear or rupture of right shoulder, not specified as traumatic: Secondary | ICD-10-CM | POA: Diagnosis not present

## 2018-07-01 NOTE — Patient Instructions (Addendum)
Good to see you  Sorry you are hurting pennsaid pinkie amount topically 2 times daily as needed.   Ice 20 minutes 2 times daily. Usually after activity and before bed. Injected the shoulders again today and will consider every 10 weeks

## 2018-07-01 NOTE — Assessment & Plan Note (Addendum)
Bilateral approach given injections.  Discussed icing regimen and home exercise.  Discussed which activities to do which wants to avoid.  Increase activity as tolerated.  Discussed topical anti-inflammatories.  Patient will follow-up again in 10 weeks for further evaluation  Spent  25 minutes with patient face-to-face and had greater than 50% of counseling including as described above in assessment and plan.

## 2018-08-17 DIAGNOSIS — H0011 Chalazion right upper eyelid: Secondary | ICD-10-CM | POA: Diagnosis not present

## 2018-08-24 ENCOUNTER — Other Ambulatory Visit: Payer: Self-pay | Admitting: Cardiovascular Disease

## 2018-11-09 NOTE — Progress Notes (Signed)
[name] [place], [place] [address] Phone: [phone] Subjective:      [name], am serving as a scribe for Dr. Hulan Saas.  CC: Bilateral shoulder pain  [id number]   07/01/2018:  Bilateral approach given injections.  Discussed icing regimen and home exercise.  Discussed which activities to do which wants to avoid.  Increase activity as tolerated.  Discussed topical anti-inflammatories.  Patient will follow-up again in 10 weeks for further evaluation Update 11/10/2018: Paul Mays is a 85 y.o. male coming in with complaint of bilateral shoulder pain. Pain has started to bother him again recently. Does have constant pain that varies in intensity. Did have relief of his symptoms since injections given in September 2019.      Past Medical History:  Diagnosis Date  . Anxiety   . Aortic stenosis   . Arthralgia   . Arthritis   . CAD (coronary artery disease)   . Carotid stenosis   . Cervicalgia   . CLUSTER HEADACHE 02/10/2007   Qualifier: Diagnosis of  By: Linna Darner MD, Gwyndolyn Saxon    . Colitis   . Edema   . Gout   . Hearing loss   . Herpes zoster without mention of complication   . Hyperlipidemia   . Hypertension   . Hyperuricemia   . Impacted cerumen   . Migraine   . Migraine   . Murmur   . Olecranon bursitis   . OSA (obstructive sleep apnea)   . Other and unspecified hyperlipidemia   . Other malaise and fatigue   . Peripheral neuropathy   . Polyneuropathy   . Pulmonary hypertension ([place])   . Unspecified hereditary and idiopathic peripheral neuropathy   . Venous insufficiency    Past Surgical History:  Procedure Laterality Date  . ANGIOPLASTY     w/ 2 stents in 1999  . APPENDECTOMY    . HEMORRHOID SURGERY    . LIPOMA EXCISION    . TONSILLECTOMY     Social History   Socioeconomic History  . Marital status: Married    Spouse name: [name]  . Number of children: 2  . Years of education: college  . Highest  education level: Not on file  Occupational History  . 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 Financial resource strain: Not hard at all  . Food insecurity:    Worry: Never true    Inability: Never true  . Transportation needs:    Medical: No    Non-medical: No  Tobacco Use  . Smoking status: Never Smoker  . Smokeless tobacco: Never Used  Substance and Sexual Activity  . Alcohol use: Yes    Alcohol/week: 0.0 standard drinks    Comment: 2 drink before dinner vodka   . Drug use: No  . Sexual activity: Never  Lifestyle  . Physical activity:    Days per week: 5 days    Minutes per session: 30 min  . Stress: Only a little  Relationships  . Social connections:    Talks on phone: More than three times a week    Gets together: More than three times a week    Attends religious service: 1 to 4 times per year    Active member of club or organization: Yes    Attends meetings of clubs or organizations: More than 4 times per year    Relationship status:  Married  Other Topics Concern  . Not on file  Social History Narrative   Pt is married and lives with wife (suzanne). Patient finished  two years of college.    Pt has children.   Caffeine -two cups daily.   Left handed.   Retired Licensed conveyancer                    Allergies  Allergen Reactions  . Lisinopril Other (See Comments)    Hyperkalemia in the context of combined spironolactone and lisinopril therapy  . Spironolactone Other (See Comments)    Hyperkalemia in context of spironolactone and lisinopril therapy   Family History  Problem Relation Age of Onset  . Stroke Father        Died, 83  . High blood pressure Father   . Diabetes Father   . Other Mother        Died, 88  . Diabetes Brother        Died, 82  . Diabetes Sister        Living, 73  . Healthy Daughter   . Healthy Son      Current Outpatient Medications (Cardiovascular):  .  losartan  (COZAAR) 100 MG tablet, TAKE 1 TABLET BY MOUTH DAILY .  metoprolol (LOPRESSOR) 50 MG tablet, Take 25 mg by mouth 2 (two) times daily. 1/2 tab by mouth two times a day.  .  pravastatin (PRAVACHOL) 40 MG tablet, Take 40 mg by mouth daily.   Marland Kitchen  terazosin (HYTRIN) 10 MG capsule, Take 12 mg by mouth at bedtime.   Current Outpatient Medications (Analgesics):  .  allopurinol (ZYLOPRIM) 300 MG tablet, Take 1 tablet (300 mg total) by mouth daily as needed (gout flare). .  naproxen sodium (ALEVE) 220 MG tablet, Take 220 mg by mouth.   Current Outpatient Medications (Other):  Marland Kitchen  Cholecalciferol (VITAMIN D3) 2000 UNITS capsule, Take 1 capsule (2,000 Units total) by mouth daily. .  Flavocoxid (LIMBREL) 500 MG CAPS, Take 500 mg by mouth daily. Marland Kitchen  gabapentin (NEURONTIN) 600 MG tablet, Take 1 tablet (600 mg total) by mouth 2 (two) times daily.    Past medical history, social, surgical and family history all reviewed in electronic medical record.  No pertanent information unless stated regarding to the chief complaint.   Review of Systems:  No headache, visual changes, nausea, vomiting, diarrhea, constipation, dizziness, abdominal pain, skin rash, fevers, chills, night sweats, weight loss, swollen lymph nodes, body aches, joint swelling,  chest pain, shortness of breath, mood changes.  Positive muscle aches  Objective  Blood pressure 112/62, pulse (!) 53, height 5\' 6"  (1.676 m), SpO2 97 %.    General: No apparent distress alert and oriented x3 mood and affect normal, dressed appropriately.  HEENT: Pupils equal, extraocular movements intact  Respiratory: Patient's speak in full sentences and does not appear short of breath  Cardiovascular: Trace lower extremity edema, non tender, no erythema  Skin: Warm dry intact with no signs of infection or rash on extremities or on axial skeleton.  Abdomen: Soft nontender  Neuro: Cranial nerves II through XII are intact, neurovascularly intact in all extremities  with 2+ DTRs and 2+ pulses.  Lymph: No lymphadenopathy of posterior or anterior cervical chain or axillae bilaterally.  Gait antalgic MSK: Mild tender with limited range of motion of multiple joints Shoulder: Bilateral Inspection shows the patient does have significant atrophy of the musculature of the shoulder girdle bilaterally.  Patient does have crepitus noted.  Mild swelling on the posterior capsule of the shoulder noted.  3+ out of 5 strength of the rotator cuff bilaterally.   Procedure: Real-time Ultrasound Guided Injection of right glenohumeral joint Device: GE Logiq Q7  Ultrasound guided injection is preferred based studies that show increased duration, increased effect, greater accuracy, decreased procedural pain, increased response rate with ultrasound guided versus blind injection.  Verbal informed consent obtained.  Time-out conducted.  Noted no overlying erythema, induration, or other signs of local infection.  Skin prepped in a sterile fashion.  Local anesthesia: Topical Ethyl chloride.  With sterile technique and under real time ultrasound guidance:  Joint visualized.  23g 1  inch needle inserted posterior approach. Pictures taken for needle placement. Patient did have injection of 2 cc of 1% lidocaine, 2 cc of 0.5% Marcaine, and 1.0 cc of Kenalog 40 mg/dL. Completed without difficulty  Pain immediately resolved suggesting accurate placement of the medication.  Advised to call if fevers/chills, erythema, induration, drainage, or persistent bleeding.  Images permanently stored and available for review in the ultrasound unit.  Impression: Technically successful ultrasound guided injection.  Procedure: Real-time Ultrasound Guided Injection of left glenohumeral joint Device: GE Logiq E  Ultrasound guided injection is preferred based studies that show increased duration, increased effect, greater accuracy, decreased procedural pain, increased response rate with ultrasound guided  versus blind injection.  Verbal informed consent obtained.  Time-out conducted.  Noted no overlying erythema, induration, or other signs of local infection.  Skin prepped in a sterile fashion.  Local anesthesia: Topical Ethyl chloride.  With sterile technique and under real time ultrasound guidance:  Joint visualized.  21g 2 inch needle inserted posterior approach. Pictures taken for needle placement. Patient did have injection of 2 cc of 0.5% Marcaine, and 1cc of Kenalog 40 mg/dL. Completed without difficulty  Pain immediately resolved suggesting accurate placement of the medication.  Advised to call if fevers/chills, erythema, induration, drainage, or persistent bleeding.  Images permanently stored and available for review in the ultrasound unit.  Impression: Technically successful ultrasound guided injection.   Impression and Recommendations:     This case required medical decision making of moderate complexity. The above documentation has been reviewed and is accurate and complete Lyndal Pulley, DO       Note: This dictation was prepared with Dragon dictation along with smaller phrase technology. Any transcriptional errors that result from this process are unintentional.

## 2018-11-10 ENCOUNTER — Encounter: Payer: Self-pay | Admitting: Family Medicine

## 2018-11-10 ENCOUNTER — Ambulatory Visit (INDEPENDENT_AMBULATORY_CARE_PROVIDER_SITE_OTHER): Payer: Medicare Other | Admitting: Family Medicine

## 2018-11-10 ENCOUNTER — Ambulatory Visit: Payer: Self-pay

## 2018-11-10 VITALS — BP 112/62 | HR 53 | Ht 66.0 in

## 2018-11-10 DIAGNOSIS — G8929 Other chronic pain: Secondary | ICD-10-CM

## 2018-11-10 DIAGNOSIS — M25511 Pain in right shoulder: Secondary | ICD-10-CM

## 2018-11-10 DIAGNOSIS — M12812 Other specific arthropathies, not elsewhere classified, left shoulder: Secondary | ICD-10-CM | POA: Diagnosis not present

## 2018-11-10 DIAGNOSIS — M12811 Other specific arthropathies, not elsewhere classified, right shoulder: Secondary | ICD-10-CM | POA: Diagnosis not present

## 2018-11-10 DIAGNOSIS — M25512 Pain in left shoulder: Secondary | ICD-10-CM

## 2018-11-10 DIAGNOSIS — M75101 Unspecified rotator cuff tear or rupture of right shoulder, not specified as traumatic: Secondary | ICD-10-CM

## 2018-11-10 DIAGNOSIS — M75102 Unspecified rotator cuff tear or rupture of left shoulder, not specified as traumatic: Secondary | ICD-10-CM | POA: Diagnosis not present

## 2018-11-10 NOTE — Patient Instructions (Signed)
Good to see you  Paul Mays is your friend Happy birthday! See me again in 3 months

## 2018-11-10 NOTE — Assessment & Plan Note (Signed)
Bilateral injections given today.  Discussed icing regimen and home exercise.  Discussed which activities to do which wants avoid patient does have significant arthritic changes of the shoulders bilaterally.  No significant change in management: We will just continue with the injections every 3 months as long as they continue to help.  Patient is agreement with the plan

## 2018-12-09 NOTE — Progress Notes (Signed)
Subjective:    Patient ID: Paul Mays, male    DOB: 09/05/1928, 85 y.o.   MRN: [id number]  HPI The patient is here for follow up.  CAD, HCM, mild AS, Hypertension: He is taking his medication daily. He is compliant with a low sodium diet.  He denies chest pain, palpitations, edema, shortness of breath and regular headaches. He is exercising regularly.    Hyperlipidemia: He is taking his medication daily. He is compliant with a low fat/cholesterol diet. He is exercising - walking daily. He denies myalgias.   Gout:  He is taking allopurinol daily.  He denies gout symptoms.   Peripheral neuropathy:  He did see neurology and it was thought it was related to alcohol.   He is taking gabapentin, which is effective.    Bilateral shoulder pain and decreased ROM:  This is chronic and getting worse.  His shoulder pop and it painful.   Medications and allergies reviewed with patient and updated if appropriate.  Patient Active Problem List   Diagnosis Date Noted  . Easy bruising 03/20/2018  . Decreased hearing of right ear 09/04/2017  . Rotator cuff tear arthropathy of both shoulders 10/18/2015  . Thrombocytopenia ([place]) 09/24/2015  . Diverticulosis of colon without hemorrhage 07/02/2014  . Hypertrophic cardiomyopathy ([place]) 03/24/2011  . OBSTRUCTIVE SLEEP APNEA 11/08/2010  . HYPERTENSION, PULMONARY 09/16/2010  . HEARING LOSS, BILATERAL 06/18/2009  . Coronary atherosclerosis 03/07/2009  . Aortic valve disorder 03/07/2009  . CAROTID STENOSIS 03/07/2009  . Hyperlipidemia 11/15/2008  . Peripheral neuropathy, idiopathic 11/15/2008  . Essential hypertension 11/15/2008  . ARTHRALGIA 11/15/2008  . COLITIS, HX OF 11/15/2008  . VENOUS INSUFFICIENCY 04/20/2007  . Edema 04/20/2007  . Gout 02/10/2007  . PSTPRC STATUS, PTCA 02/10/2007    Current Outpatient Medications on File Prior to Visit  Medication Sig Dispense Refill  . allopurinol (ZYLOPRIM) 300 MG tablet Take 1 tablet (300 mg total)  by mouth daily as needed (gout flare). 30 tablet 0  . Cholecalciferol (VITAMIN D3) 2000 UNITS capsule Take 1 capsule (2,000 Units total) by mouth daily.    . Flavocoxid (LIMBREL) 500 MG CAPS Take 500 mg by mouth daily.    Marland Kitchen gabapentin (NEURONTIN) 600 MG tablet Take 1 tablet (600 mg total) by mouth 2 (two) times daily. 60 tablet 0  . losartan (COZAAR) 100 MG tablet TAKE 1 TABLET BY MOUTH DAILY 90 tablet 2  . metoprolol (LOPRESSOR) 50 MG tablet Take 25 mg by mouth 2 (two) times daily. 1/2 tab by mouth two times a day.     . naproxen sodium (ALEVE) 220 MG tablet Take 220 mg by mouth.    . pravastatin (PRAVACHOL) 40 MG tablet Take 40 mg by mouth daily.      Marland Kitchen terazosin (HYTRIN) 10 MG capsule Take 12 mg by mouth at bedtime.     No current facility-administered medications on file prior to visit.     Past Medical History:  Diagnosis Date  . Anxiety   . Aortic stenosis   . Arthralgia   . Arthritis   . CAD (coronary artery disease)   . Carotid stenosis   . Cervicalgia   . CLUSTER HEADACHE 02/10/2007   Qualifier: Diagnosis of  By: Linna Darner MD, Gwyndolyn Saxon    . Colitis   . Edema   . Gout   . Hearing loss   . Herpes zoster without mention of complication   . Hyperlipidemia   . Hypertension   . Hyperuricemia   . Impacted  cerumen   . Migraine   . Migraine   . Murmur   . Olecranon bursitis   . OSA (obstructive sleep apnea)   . Other and unspecified hyperlipidemia   . Other malaise and fatigue   . Peripheral neuropathy   . Polyneuropathy   . Pulmonary hypertension ([place])   . Unspecified hereditary and idiopathic peripheral neuropathy   . Venous insufficiency     Past Surgical History:  Procedure Laterality Date  . ANGIOPLASTY     w/ 2 stents in 1999  . APPENDECTOMY    . HEMORRHOID SURGERY    . LIPOMA EXCISION    . TONSILLECTOMY      Social History   Socioeconomic History  . Marital status: Married    Spouse name: [name]  . Number of children: 2  . Years of education: college    . Highest education level: Not on file  Occupational History  . 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 Financial resource strain: Not hard at all  . Food insecurity:    Worry: Never true    Inability: Never true  . Transportation needs:    Medical: No    Non-medical: No  Tobacco Use  . Smoking status: Never Smoker  . Smokeless tobacco: Never Used  Substance and Sexual Activity  . Alcohol use: Yes    Alcohol/week: 0.0 standard drinks    Comment: 2 drink before dinner vodka   . Drug use: No  . Sexual activity: Never  Lifestyle  . Physical activity:    Days per week: 5 days    Minutes per session: 30 min  . Stress: Only a little  Relationships  . Social connections:    Talks on phone: More than three times a week    Gets together: More than three times a week    Attends religious service: 1 to 4 times per year    Active member of club or organization: Yes    Attends meetings of clubs or organizations: More than 4 times per year    Relationship status: Married  Other Topics Concern  . Not on file  Social History Narrative   Pt is married and lives with wife (suzanne). Patient finished  two years of college.    Pt has children.   Caffeine -two cups daily.   Left handed.   Retired Licensed conveyancer                     Family History  Problem Relation Age of Onset  . Stroke Father        Died, 83  . High blood pressure Father   . Diabetes Father   . Other Mother        Died, 88  . Diabetes Brother        Died, 82  . Diabetes Sister        Living, 55  . Healthy Daughter   . Healthy Son     Review of Systems  Constitutional: Negative for chills and fever.  Respiratory: Negative for cough, shortness of breath and wheezing.   Cardiovascular: Positive for leg swelling (L > R, resolves overnight). Negative for chest pain and palpitations.  Neurological: Positive for light-headedness. Negative for  headaches.       Objective:   Vitals:   12/10/18 1331  BP: (!) 138/58  Pulse: (!) 50  Resp: 16  Temp:  98.1 F (36.7 C)  SpO2: 94%   BP Readings from Last 3 Encounters:  12/10/18 (!) 138/58  11/10/18 112/62  07/01/18 120/60   Wt Readings from Last 3 Encounters:  12/10/18 176 lb 12.8 oz (80.2 kg)  07/01/18 179 lb (81.2 kg)  06/17/18 180 lb (81.6 kg)   Body mass index is 28.54 kg/m.   Physical Exam    Constitutional: Appears well-developed and well-nourished. No distress.  HENT:  Head: Normocephalic and atraumatic.  Neck: Neck supple. No tracheal deviation present. No thyromegaly present.  No cervical lymphadenopathy Cardiovascular: Normal rate, regular rhythm and normal heart sounds.   2/6 systolic murmur heard. No carotid bruit .  No edema Pulmonary/Chest: Effort normal and breath sounds normal. No respiratory distress. No has no wheezes. No rales.  Skin: Skin is warm and dry. Not diaphoretic.  Psychiatric: Normal mood and affect. Behavior is normal.      Assessment & Plan:   He follows at the [facility] - goes there every 6 months   See Problem List for Assessment and Plan of chronic medical problems.

## 2018-12-09 NOTE — Patient Instructions (Addendum)
   Medications reviewed and updated.  Changes include :   none   A referral was ordered for orthopedics.      Please followup in 1 year

## 2018-12-10 ENCOUNTER — Encounter: Payer: Self-pay | Admitting: Internal Medicine

## 2018-12-10 ENCOUNTER — Ambulatory Visit (INDEPENDENT_AMBULATORY_CARE_PROVIDER_SITE_OTHER): Payer: Medicare Other | Admitting: Internal Medicine

## 2018-12-10 VITALS — BP 138/58 | HR 50 | Temp 98.1°F | Resp 16 | Ht 66.0 in | Wt 176.8 lb

## 2018-12-10 DIAGNOSIS — M109 Gout, unspecified: Secondary | ICD-10-CM | POA: Diagnosis not present

## 2018-12-10 DIAGNOSIS — M25512 Pain in left shoulder: Secondary | ICD-10-CM

## 2018-12-10 DIAGNOSIS — E7849 Other hyperlipidemia: Secondary | ICD-10-CM

## 2018-12-10 DIAGNOSIS — I10 Essential (primary) hypertension: Secondary | ICD-10-CM

## 2018-12-10 DIAGNOSIS — G609 Hereditary and idiopathic neuropathy, unspecified: Secondary | ICD-10-CM

## 2018-12-10 DIAGNOSIS — M25511 Pain in right shoulder: Secondary | ICD-10-CM | POA: Diagnosis not present

## 2018-12-10 DIAGNOSIS — I422 Other hypertrophic cardiomyopathy: Secondary | ICD-10-CM

## 2018-12-10 DIAGNOSIS — I2510 Atherosclerotic heart disease of native coronary artery without angina pectoris: Secondary | ICD-10-CM

## 2018-12-10 DIAGNOSIS — G8929 Other chronic pain: Secondary | ICD-10-CM | POA: Insufficient documentation

## 2018-12-10 NOTE — Assessment & Plan Note (Signed)
Has seen Dr Tamala Julian - getting injection, which help temporarily Wonders about second opinion - advised it is likely arthritis and there is not much else that can be done  Will refer to ortho

## 2018-12-10 NOTE — Assessment & Plan Note (Signed)
Continue statin. 

## 2018-12-10 NOTE — Assessment & Plan Note (Signed)
No cp, palps, sob Continue current medications

## 2018-12-10 NOTE — Assessment & Plan Note (Signed)
BP well controlled Current regimen effective and well tolerated Continue current medications at current doses  

## 2018-12-10 NOTE — Assessment & Plan Note (Signed)
Asymptomatic Follows with cardiology

## 2018-12-10 NOTE — Assessment & Plan Note (Signed)
Taking gabapentin continue

## 2018-12-10 NOTE — Assessment & Plan Note (Signed)
No gout symptoms Continue allopurinol 

## 2019-02-09 ENCOUNTER — Telehealth: Payer: Self-pay | Admitting: Emergency Medicine

## 2019-02-09 ENCOUNTER — Ambulatory Visit: Payer: Medicare Other | Admitting: Family Medicine

## 2019-02-09 NOTE — Telephone Encounter (Signed)
Does he want to come in for a visit next week?  Is there a concern or is this for follow up?

## 2019-02-09 NOTE — Telephone Encounter (Signed)
Spoke with pt. Appt scheduled for Friday.

## 2019-02-09 NOTE — Telephone Encounter (Signed)
Copied from [place] [id number]. Topic: Appointment Scheduling - Scheduling Inquiry for Clinic >> Feb 09, 2019  2:14 PM Paul Mays wrote: Reason for CRM: Pt called to schedule appt with Dr. Quay Burow. He is unable to do virtual appt and would like to know if he can speak with Dr. Quay Burow via telephone. Please advise. [phone]

## 2019-02-10 NOTE — Progress Notes (Signed)
Subjective:    Patient ID: Paul Mays, male    DOB: 12-Aug-1928, 85 y.o.   MRN: [id number]  HPI The patient is here for an acute visit.   He has chronic b/l shoulder pain and is having more difficulty with driving, clearning up after the dog and other daily activities.  He has pain turning over in bed. Combing his hair in the morning is a chore.  Raising his shoulder is very difficult and painful.   The pain is worse in the left shoulder. Certain movements of the left shoulder will cause his shoulder to pop and he will have severe pain.    He has been diagnosed with rotator cuff tear arthropathy of both shoulders.  He has had injections and they did not help.  Aleve helps a little.  He takes two aleve twice daily.     Medications and allergies reviewed with patient and updated if appropriate.  Patient Active Problem List   Diagnosis Date Noted  . Chronic pain of both shoulders 12/10/2018  . Easy bruising 03/20/2018  . Decreased hearing of right ear 09/04/2017  . Rotator cuff tear arthropathy of both shoulders 10/18/2015  . Thrombocytopenia ([place]) 09/24/2015  . Diverticulosis of colon without hemorrhage 07/02/2014  . Hypertrophic cardiomyopathy ([place]) 03/24/2011  . OBSTRUCTIVE SLEEP APNEA 11/08/2010  . HYPERTENSION, PULMONARY 09/16/2010  . HEARING LOSS, BILATERAL 06/18/2009  . Coronary atherosclerosis 03/07/2009  . Aortic valve disorder 03/07/2009  . CAROTID STENOSIS 03/07/2009  . Hyperlipidemia 11/15/2008  . Peripheral neuropathy, idiopathic 11/15/2008  . Essential hypertension 11/15/2008  . ARTHRALGIA 11/15/2008  . COLITIS, HX OF 11/15/2008  . VENOUS INSUFFICIENCY 04/20/2007  . Edema 04/20/2007  . Gout 02/10/2007  . PSTPRC STATUS, PTCA 02/10/2007    Current Outpatient Medications on File Prior to Visit  Medication Sig Dispense Refill  . allopurinol (ZYLOPRIM) 300 MG tablet Take 1 tablet (300 mg total) by mouth daily as needed (gout flare). 30 tablet 0  .  Cholecalciferol (VITAMIN D3) 2000 UNITS capsule Take 1 capsule (2,000 Units total) by mouth daily.    . Flavocoxid (LIMBREL) 500 MG CAPS Take 500 mg by mouth daily.    Marland Kitchen gabapentin (NEURONTIN) 600 MG tablet Take 1 tablet (600 mg total) by mouth 2 (two) times daily. 60 tablet 0  . losartan (COZAAR) 100 MG tablet TAKE 1 TABLET BY MOUTH DAILY 90 tablet 2  . metoprolol (LOPRESSOR) 50 MG tablet Take 25 mg by mouth 2 (two) times daily. 1/2 tab by mouth two times a day.     . naproxen sodium (ALEVE) 220 MG tablet Take 220 mg by mouth.    . pravastatin (PRAVACHOL) 40 MG tablet Take 40 mg by mouth daily.      Marland Kitchen terazosin (HYTRIN) 10 MG capsule Take 12 mg by mouth at bedtime.     No current facility-administered medications on file prior to visit.     Past Medical History:  Diagnosis Date  . Anxiety   . Aortic stenosis   . Arthralgia   . Arthritis   . CAD (coronary artery disease)   . Carotid stenosis   . Cervicalgia   . CLUSTER HEADACHE 02/10/2007   Qualifier: Diagnosis of  By: Linna Darner MD, Gwyndolyn Saxon    . Colitis   . Edema   . Gout   . Hearing loss   . Herpes zoster without mention of complication   . Hyperlipidemia   . Hypertension   . Hyperuricemia   . Impacted cerumen   .  Migraine   . Migraine   . Murmur   . Olecranon bursitis   . OSA (obstructive sleep apnea)   . Other and unspecified hyperlipidemia   . Other malaise and fatigue   . Peripheral neuropathy   . Polyneuropathy   . Pulmonary hypertension ([place])   . Unspecified hereditary and idiopathic peripheral neuropathy   . Venous insufficiency     Past Surgical History:  Procedure Laterality Date  . ANGIOPLASTY     w/ 2 stents in 1999  . APPENDECTOMY    . HEMORRHOID SURGERY    . LIPOMA EXCISION    . TONSILLECTOMY      Social History   Socioeconomic History  . Marital status: Married    Spouse name: [name]  . Number of children: 2  . Years of education: college  . Highest education level: Not on file  Occupational  History  . 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 Financial resource strain: Not hard at all  . Food insecurity:    Worry: Never true    Inability: Never true  . Transportation needs:    Medical: No    Non-medical: No  Tobacco Use  . Smoking status: Never Smoker  . Smokeless tobacco: Never Used  Substance and Sexual Activity  . Alcohol use: Yes    Alcohol/week: 0.0 standard drinks    Comment: 2 drink before dinner vodka   . Drug use: No  . Sexual activity: Never  Lifestyle  . Physical activity:    Days per week: 5 days    Minutes per session: 30 min  . Stress: Only a little  Relationships  . Social connections:    Talks on phone: More than three times a week    Gets together: More than three times a week    Attends religious service: 1 to 4 times per year    Active member of club or organization: Yes    Attends meetings of clubs or organizations: More than 4 times per year    Relationship status: Married  Other Topics Concern  . Not on file  Social History Narrative   Pt is married and lives with wife (suzanne). Patient finished  two years of college.    Pt has children.   Caffeine -two cups daily.   Left handed.   Retired Licensed conveyancer                     Family History  Problem Relation Age of Onset  . Stroke Father        Died, 83  . High blood pressure Father   . Diabetes Father   . Other Mother        Died, 88  . Diabetes Brother        Died, 82  . Diabetes Sister        Living, 25  . Healthy Daughter   . Healthy Son     Review of Systems  Musculoskeletal: Positive for joint swelling (b/l shoulders - radiates down arms - ache).  Neurological: Negative for weakness (no weakness in the hands) and numbness.       Objective:   Vitals:   02/11/19 1317  BP: (!) 158/64  Pulse: 63  Resp: 16  Temp: 98.4 F (36.9 C)  SpO2: 96%   BP Readings from Last 3 Encounters:  02/11/19  (!) 158/64  12/10/18 (!) 138/58  11/10/18 112/62   Wt Readings from Last 3 Encounters:  02/11/19 180 lb 6.4 oz (81.8 kg)  12/10/18 176 lb 12.8 oz (80.2 kg)  07/01/18 179 lb (81.2 kg)   Body mass index is 29.12 kg/m.   Physical Exam Constitutional:      Appearance: Normal appearance.  Musculoskeletal:        General: No swelling, tenderness or deformity.     Comments: Decreased ROM of both shoulders - pain with lifting arms  Skin:    General: Skin is warm and dry.  Neurological:     Mental Status: He is alert.            Assessment & Plan:    See Problem List for Assessment and Plan of chronic medical problems.

## 2019-02-11 ENCOUNTER — Ambulatory Visit (INDEPENDENT_AMBULATORY_CARE_PROVIDER_SITE_OTHER): Payer: Medicare Other | Admitting: Internal Medicine

## 2019-02-11 ENCOUNTER — Encounter: Payer: Self-pay | Admitting: Internal Medicine

## 2019-02-11 ENCOUNTER — Other Ambulatory Visit: Payer: Self-pay

## 2019-02-11 VITALS — BP 158/64 | HR 63 | Temp 98.4°F | Resp 16 | Ht 66.0 in | Wt 180.4 lb

## 2019-02-11 DIAGNOSIS — G8929 Other chronic pain: Secondary | ICD-10-CM | POA: Diagnosis not present

## 2019-02-11 DIAGNOSIS — I2510 Atherosclerotic heart disease of native coronary artery without angina pectoris: Secondary | ICD-10-CM

## 2019-02-11 DIAGNOSIS — M25511 Pain in right shoulder: Secondary | ICD-10-CM

## 2019-02-11 DIAGNOSIS — M25512 Pain in left shoulder: Secondary | ICD-10-CM | POA: Diagnosis not present

## 2019-02-11 MED ORDER — TRAMADOL HCL 50 MG PO TABS: 50.0000 mg | ORAL_TABLET | Freq: Three times a day (TID) | ORAL | 1 refills | Status: DC | PRN

## 2019-02-11 NOTE — Assessment & Plan Note (Addendum)
Chronic pain in b/l shoulder, L > R B/l rotator cuff tear arthropathy and arthritis Taking aleve but I am concerned about side effects with long term use and it is not relieving his pain enough Trial of tramadol - discussed possible side effects He will f/u with ortho next week

## 2019-02-11 NOTE — Patient Instructions (Addendum)
   Medications reviewed and updated.  Changes include :   Try tramadol three times a day as needed for severe pain  Your prescription(s) have been submitted to your pharmacy. Please take as directed and contact our office if you believe you are having problem(s) with the medication(s).

## 2019-02-21 DIAGNOSIS — M542 Cervicalgia: Secondary | ICD-10-CM | POA: Diagnosis not present

## 2019-02-21 DIAGNOSIS — M19011 Primary osteoarthritis, right shoulder: Secondary | ICD-10-CM | POA: Diagnosis not present

## 2019-02-21 DIAGNOSIS — M19012 Primary osteoarthritis, left shoulder: Secondary | ICD-10-CM | POA: Diagnosis not present

## 2019-05-19 ENCOUNTER — Other Ambulatory Visit: Payer: Self-pay | Admitting: Cardiovascular Disease

## 2019-06-22 DIAGNOSIS — Z23 Encounter for immunization: Secondary | ICD-10-CM | POA: Diagnosis not present

## 2019-06-28 ENCOUNTER — Ambulatory Visit: Payer: Medicare Other | Admitting: Physician Assistant

## 2019-06-29 DIAGNOSIS — M1A09X0 Idiopathic chronic gout, multiple sites, without tophus (tophi): Secondary | ICD-10-CM | POA: Diagnosis not present

## 2019-06-29 DIAGNOSIS — Z6830 Body mass index (BMI) 30.0-30.9, adult: Secondary | ICD-10-CM | POA: Diagnosis not present

## 2019-06-29 DIAGNOSIS — M25512 Pain in left shoulder: Secondary | ICD-10-CM | POA: Diagnosis not present

## 2019-06-29 DIAGNOSIS — E669 Obesity, unspecified: Secondary | ICD-10-CM | POA: Diagnosis not present

## 2019-06-29 DIAGNOSIS — N183 Chronic kidney disease, stage 3 (moderate): Secondary | ICD-10-CM | POA: Diagnosis not present

## 2019-06-29 DIAGNOSIS — M25511 Pain in right shoulder: Secondary | ICD-10-CM | POA: Diagnosis not present

## 2019-06-29 DIAGNOSIS — M150 Primary generalized (osteo)arthritis: Secondary | ICD-10-CM | POA: Diagnosis not present

## 2019-07-26 ENCOUNTER — Ambulatory Visit: Payer: Medicare Other | Admitting: Physician Assistant

## 2019-08-16 ENCOUNTER — Other Ambulatory Visit: Payer: Self-pay | Admitting: Cardiovascular Disease

## 2019-08-17 ENCOUNTER — Other Ambulatory Visit: Payer: Self-pay | Admitting: Cardiovascular Disease

## 2019-08-17 ENCOUNTER — Ambulatory Visit (INDEPENDENT_AMBULATORY_CARE_PROVIDER_SITE_OTHER): Payer: Medicare Other | Admitting: *Deleted

## 2019-08-17 DIAGNOSIS — Z0000 Encounter for general adult medical examination without abnormal findings: Secondary | ICD-10-CM

## 2019-08-17 NOTE — Progress Notes (Addendum)
Subjective:   Paul Mays is a 85 y.o. male who presents for Medicare Annual/Subsequent preventive examination. I connected with patient by a telephone and verified that I am speaking with the correct person using two identifiers. Patient stated full name and DOB. Patient gave permission to continue with telephonic visit. Patient's location was at home and Nurse's location was at [place] office. Participants during this visit included patient and nurse.  Review of Systems:   Cardiac Risk Factors include: advanced age (>16men, >64 women);dyslipidemia;male gender;hypertension Sleep patterns: feels rested on waking, gets up 2 times nightly to void and sleeps 7-8 hours nightly.    Home Safety/Smoke Alarms: Feels safe in home. Smoke alarms in place.  Living environment; residence and Firearm Safety: 1-story house/ trailer. Lives with wife, no needs for DME, good support system Seat Belt Safety/Bike Helmet: Wears seat belt.     Objective:    Vitals: There were no vitals taken for this visit.  There is no height or weight on file to calculate BMI.  Advanced Directives 08/17/2019 06/11/2018 11/07/2015  Does Patient Have a Medical Advance Directive? Yes Yes Yes  Type of Paramedic of [place];Living will [place];Living will -  Copy of [place] in Chart? No - copy requested Yes Yes    Tobacco Social History   Tobacco Use  Smoking Status Never Smoker  Smokeless Tobacco Never Used     Counseling given: Not Answered  Past Medical History:  Diagnosis Date  . Anxiety   . Aortic stenosis   . Arthralgia   . Arthritis   . CAD (coronary artery disease)   . Carotid stenosis   . Cervicalgia   . CLUSTER HEADACHE 02/10/2007   Qualifier: Diagnosis of  By: Linna Darner MD, Gwyndolyn Saxon    . Colitis   . Edema   . Gout   . Hearing loss   . Herpes zoster without mention of complication   . Hyperlipidemia   . Hypertension   . Hyperuricemia    . Impacted cerumen   . Migraine   . Migraine   . Murmur   . Olecranon bursitis   . OSA (obstructive sleep apnea)   . Other and unspecified hyperlipidemia   . Other malaise and fatigue   . Peripheral neuropathy   . Polyneuropathy   . Pulmonary hypertension ([place])   . Unspecified hereditary and idiopathic peripheral neuropathy   . Venous insufficiency    Past Surgical History:  Procedure Laterality Date  . ANGIOPLASTY     w/ 2 stents in 1999  . APPENDECTOMY    . HEMORRHOID SURGERY    . LIPOMA EXCISION    . TONSILLECTOMY     Family History  Problem Relation Age of Onset  . Stroke Father        Died, 83  . High blood pressure Father   . Diabetes Father   . Other Mother        Died, 88  . Diabetes Brother        Died, 82  . Diabetes Sister        Living, 8  . Healthy Daughter   . Healthy Son    Social History   Socioeconomic History  . Marital status: Married    Spouse name: Paul Mays Level  . Number of children: 2  . Years of education: college  . Highest education level: Not on file  Occupational History  . Occupation: retired    Comment: worked as  a memory expert/speaker    Employer: RETIRED  Social Needs  . Financial resource strain: Not hard at all  . Food insecurity    Worry: Never true    Inability: Never true  . Transportation needs    Medical: No    Non-medical: No  Tobacco Use  . Smoking status: Never Smoker  . Smokeless tobacco: Never Used  Substance and Sexual Activity  . Alcohol use: Yes    Alcohol/week: 0.0 standard drinks    Comment: 2 drink before dinner vodka   . Drug use: No  . Sexual activity: Never  Lifestyle  . Physical activity    Days per week: 5 days    Minutes per session: 30 min  . Stress: Only a little  Relationships  . Social connections    Talks on phone: More than three times a week    Gets together: More than three times a week    Attends religious service: 1 to 4 times per year    Active member of club or organization:  Yes    Attends meetings of clubs or organizations: More than 4 times per year    Relationship status: Married  Other Topics Concern  . Not on file  Social History Narrative   Pt is married and lives with wife (suzanne). Patient finished  two years of college.    Pt has children.   Caffeine -two cups daily.   Left handed.   Retired Licensed conveyancer                     Outpatient Encounter Medications as of 08/17/2019  Medication Sig  . allopurinol (ZYLOPRIM) 300 MG tablet Take 1 tablet (300 mg total) by mouth daily as needed (gout flare).  . Cholecalciferol (VITAMIN D3) 2000 UNITS capsule Take 1 capsule (2,000 Units total) by mouth daily.  . Flavocoxid (LIMBREL) 500 MG CAPS Take 500 mg by mouth daily.  Marland Kitchen gabapentin (NEURONTIN) 600 MG tablet Take 1 tablet (600 mg total) by mouth 2 (two) times daily.  Marland Kitchen losartan (COZAAR) 100 MG tablet TAKE 1 TABLET BY MOUTH DAILY  . metoprolol (LOPRESSOR) 50 MG tablet Take 25 mg by mouth 2 (two) times daily. 1/2 tab by mouth two times a day.   . naproxen sodium (ALEVE) 220 MG tablet Take 220 mg by mouth.  . pravastatin (PRAVACHOL) 40 MG tablet Take 40 mg by mouth daily.    Marland Kitchen terazosin (HYTRIN) 10 MG capsule Take 12 mg by mouth at bedtime.  . traMADol (ULTRAM) 50 MG tablet Take 1 tablet (50 mg total) by mouth every 8 (eight) hours as needed for moderate pain (for chronic shoulder pain).   No facility-administered encounter medications on file as of 08/17/2019.     Activities of Daily Living In your present state of health, do you have any difficulty performing the following activities: 08/17/2019  Hearing? N  Vision? N  Difficulty concentrating or making decisions? N  Walking or climbing stairs? N  Dressing or bathing? N  Doing errands, shopping? N  Preparing Food and eating ? N  Using the Toilet? N  In the past six months, have you accidently leaked urine? N  Do you have problems with loss of bowel control? N  Managing your Medications? N   Managing your Finances? N  Housekeeping or managing your Housekeeping? N  Some recent data might be hidden    Patient Care Team: Binnie Rail, MD as PCP - General (Internal Medicine)  Sherren Mocha, MD as PCP - Cardiology (Cardiology)   Assessment:   This is a routine wellness examination for [place]. Physical assessment deferred to PCP.  Exercise Activities and Dietary recommendations Current Exercise Habits: Home exercise routine, Type of exercise: walking, Time (Minutes): 30, Frequency (Times/Week): 6, Weekly Exercise (Minutes/Week): 180, Intensity: Moderate, Exercise limited by: None identified  Diet (meal preparation, eat out, water intake, caffeinated beverages, dairy products, fruits and vegetables): in general, a "healthy" diet  , well balanced   Reviewed heart healthy diet. Encouraged patient to increase daily water and healthy fluid intake.  Goals      Patient Stated   . patient  (pt-stated)     Try to maintain health; continue to walk dog;        Other   . Patient Stated     Stay as healthy and as independent as possible       Fall Risk Fall Risk  08/17/2019 02/11/2019 06/11/2018 09/04/2017 12/02/2016  Falls in the past year? 0 0 No No No  Number falls in past yr: 0 - - - -  Injury with Fall? 0 - - - -   Is the patient's home free of loose throw rugs in walkways, pet beds, electrical cords, etc?   yes      Grab bars in the bathroom? yes      Handrails on the stairs?   yes      Adequate lighting?   yes  Depression Screen PHQ 2/9 Scores 08/17/2019 02/11/2019 06/11/2018 09/04/2017  PHQ - 2 Score 0 0 1 0  PHQ- 9 Score - - 2 -    Cognitive Function MMSE - Mini Mental State Exam 06/11/2018  Orientation to time 5  Orientation to Place 5  Registration 3  Attention/ Calculation 5  Recall 3  Language- name 2 objects 2  Language- repeat 1  Language- follow 3 step command 3  Language- read & follow direction 1  Write a sentence 1  Copy design 1  Total score 30      6CIT Screen 08/17/2019  What Year? 0 points  What month? 0 points  What time? 0 points  Count back from 20 0 points  Months in reverse 0 points  Repeat phrase 2 points  Total Score 2    Immunization History  Administered Date(s) Administered  . Influenza, High Dose Seasonal PF 06/30/2014, 07/04/2017, 06/11/2018  . Influenza,inj,Quad PF,6+ Mos 07/04/2017  . Influenza-Unspecified 07/06/2013, 06/07/2015, 07/08/2016  . Pneumococcal Conjugate-13 06/07/2015  . Pneumococcal Polysaccharide-23 12/05/2013, 07/08/2016  . Td 02/10/2015   Screening Tests Health Maintenance  Topic Date Due  . INFLUENZA VACCINE  05/07/2019  . TETANUS/TDAP  02/09/2025  . PNA vac Low Risk Adult  Completed       Plan:    Reviewed health maintenance screenings with patient today and relevant education, vaccines, and/or referrals were provided.   I have personally reviewed and noted the following in the patient's chart:   . Medical and social history . Use of alcohol, tobacco or illicit drugs  . Current medications and supplements . Functional ability and status . Nutritional status . Physical activity . Advanced directives . List of other physicians . Screenings to include cognitive, depression, and falls . Referrals and appointments  In addition, I have reviewed and discussed with patient certain preventive protocols, quality metrics, and best practice recommendations. A written personalized care plan for preventive services as well as general preventive health recommendations were provided to patient.  Michiel Cowboy, RN  08/17/2019    Medical screening examination/treatment/procedure(s) were performed by non-physician practitioner and as supervising physician I was immediately available for consultation/collaboration. I agree with above. Binnie Rail, MD

## 2019-08-27 NOTE — Progress Notes (Signed)
Cardiology Office Note:    Date:  08/29/2019   ID:  Paul Mays, DOB [birth year], MRN [id number]  PCP:  Binnie Rail, MD  Cardiologist:  Sherren Mocha, MD  Electrophysiologist:  None   Referring MD: Binnie Rail, MD   Chief Complaint: follow-up of CAD  History of Present Illness:    Paul Mays is a 85 y.o. male with a history of CAD s/p remote PCI, mild LVH with septal hypertrophy/LV outflow obstruction noted on Echo in 2016, hypertension, hyperlipidemia, obstructive sleep apnea, migraines, and peripheral neuropathy who is followed by Dr. Burt Knack and presents today for routine follow-up.   Patient has known CAD s/p remote PCI to the RCA and left circumflex in 1999 performed by Dr. Olevia Perches. He also has known LVH with septal hypertrophy and LV outflow tract obstruction. Last Echo in 03/2015 showed stable findings with LVEF of 65-70%, LV outflow tract obstruction with peak and mean gradients of 25 and 14 mmHg, respectively. Also showed aortic valve thickening with minimal restriction of leaflet motion, systolic anterior motion of the mitral valve, and elevated PASP of 51 mmHg. However, he has done well from a cardiac standpoint for the last several years. He was last seen by Dr. Burt Knack in 06/2018 at which time he was doing well and denied any cardiac symptoms.   Patient presents today for routine follow-up.  Patient doing well from a cardiac standpoint.  He stays active walking his dog about 4 times per day and going to the grocery store to walk for an hour at a time.  He notes some very mild lightheadedness/dizziness after taking his medications but denies any near syncope, syncope, or falls.  He states this has been going on for several years and is not new.  He also notes some mild ankle edema but it is not very bothersome to him.  He denies any chest pain, shortness of breath, palpitations, orthopnea, PND.  He bruises easily but denies any abnormal bleeding including any hematochezia,  melena, or hematuria.  BP is mildly elevated at 150/72.   Past Medical History:  Diagnosis Date  . Anxiety   . Aortic stenosis   . Arthralgia   . Arthritis   . CAD (coronary artery disease)   . Carotid stenosis   . Cervicalgia   . CLUSTER HEADACHE 02/10/2007   Qualifier: Diagnosis of  By: Linna Darner MD, Gwyndolyn Saxon    . Colitis   . Edema   . Gout   . Hearing loss   . Herpes zoster without mention of complication   . Hyperlipidemia   . Hypertension   . Hyperuricemia   . Impacted cerumen   . Migraine   . Migraine   . Murmur   . Olecranon bursitis   . OSA (obstructive sleep apnea)   . Other and unspecified hyperlipidemia   . Other malaise and fatigue   . Peripheral neuropathy   . Polyneuropathy   . Pulmonary hypertension ([place])   . Unspecified hereditary and idiopathic peripheral neuropathy   . Venous insufficiency     Past Surgical History:  Procedure Laterality Date  . ANGIOPLASTY     w/ 2 stents in 1999  . APPENDECTOMY    . HEMORRHOID SURGERY    . LIPOMA EXCISION    . TONSILLECTOMY      Current Medications: Current Meds  Medication Sig  . allopurinol (ZYLOPRIM) 300 MG tablet Take 1 tablet (300 mg total) by mouth daily as needed (gout flare).  . Cholecalciferol (  VITAMIN D3) 2000 UNITS capsule Take 1 capsule (2,000 Units total) by mouth daily.  . Flavocoxid (LIMBREL) 500 MG CAPS Take 500 mg by mouth daily.  Marland Kitchen gabapentin (NEURONTIN) 600 MG tablet Take 1 tablet (600 mg total) by mouth 2 (two) times daily.  Marland Kitchen losartan (COZAAR) 100 MG tablet TAKE 1 TABLET BY MOUTH DAILY  . metoprolol (LOPRESSOR) 50 MG tablet Take 25 mg by mouth 2 (two) times daily. 1/2 tab by mouth two times a day.   . naproxen sodium (ALEVE) 220 MG tablet Take 220 mg by mouth as needed.   . pravastatin (PRAVACHOL) 40 MG tablet Take 40 mg by mouth daily.    Marland Kitchen terazosin (HYTRIN) 10 MG capsule Take 12 mg by mouth at bedtime.  . traMADol (ULTRAM) 50 MG tablet Take 1 tablet (50 mg total) by mouth every 8 (eight)  hours as needed for moderate pain (for chronic shoulder pain).     Allergies:   Lisinopril and Spironolactone   Social History   Socioeconomic History  . Marital status: Married    Spouse name: Paul Mays  . Number of children: 2  . Years of education: college  . Highest education Mays: Not on file  Occupational History  . 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 Financial resource strain: Not hard at all  . Food insecurity    Worry: Never true    Inability: Never true  . Transportation needs    Medical: No    Non-medical: No  Tobacco Use  . Smoking status: Never Smoker  . Smokeless tobacco: Never Used  Substance and Sexual Activity  . Alcohol use: Yes    Alcohol/week: 0.0 standard drinks    Comment: 2 drink before dinner vodka   . Drug use: No  . Sexual activity: Never  Lifestyle  . Physical activity    Days per week: 5 days    Minutes per session: 30 min  . Stress: Only a little  Relationships  . Social connections    Talks on phone: More than three times a week    Gets together: More than three times a week    Attends religious service: 1 to 4 times per year    Active member of club or organization: Yes    Attends meetings of clubs or organizations: More than 4 times per year    Relationship status: Married  Other Topics Concern  . Not on file  Social History Narrative   Pt is married and lives with wife (suzanne). Patient finished  two years of college.    Pt has children.   Caffeine -two cups daily.   Left handed.   Retired Licensed conveyancer                      Family History: The patient's family history includes Diabetes in his brother, father, and sister; Healthy in his daughter and son; High blood pressure in his father; Other in his mother; Stroke in his father.  ROS:   Please see the history of present illness.    All other systems reviewed and are negative.  EKGs/Labs/Other  Studies Reviewed:    The following studies were reviewed today:  Echocardiogram 03/15/2015: Study Conclusions: - Left ventricle: LVOT is narrow. There is turbulent flow through   the LVOT at rest . Peak and mean gradients through the LVOT/AV   are 25 and 14 mm  Hg respectively The cavity size was normal. Wall   thickness was increased in a pattern of mild LVH. Systolic   function was vigorous. The estimated ejection fraction was in the   range of 65% to 70%. Doppler parameters are consistent with   abnormal left ventricular relaxation (grade 1 diastolic   dysfunction). - Aortic valve: AV is thickened with minimally restricted motion.   There was mild regurgitation. - Mitral valve: Mild systolic anterior motion of the anteior mitral   leaflet. Calcified annulus. Mildly thickened leaflets . - Left atrium: The atrium was severely dilated. - Pulmonary arteries: PA peak pressure: 51 mm Hg (S).  EKG:  EKG ordered today. EKG personally reviewed and demonstrates sinus bradycardia, rate 53 bpm, with 1st degree AV blocker and non-specific ST/T changes. No significant changes compared to prior tracings..  Recent Labs: No results found for requested labs within last 8760 hours.  Recent Lipid Panel    Component Value Date/Time   CHOL 149 12/09/2017 1457   TRIG 57.0 12/09/2017 1457   HDL 72.30 12/09/2017 1457   CHOLHDL 2 12/09/2017 1457   VLDL 11.4 12/09/2017 1457   LDLCALC 65 12/09/2017 1457    Physical Exam:    Vital Signs: BP (!) 150/72   Pulse (!) 53   Ht 5\' 6"  (1.676 m)   Wt 180 lb 6.4 oz (81.8 kg)   SpO2 99%   BMI 29.12 kg/m     Wt Readings from Last 3 Encounters:  08/29/19 180 lb 6.4 oz (81.8 kg)  02/11/19 180 lb 6.4 oz (81.8 kg)  12/10/18 176 lb 12.8 oz (80.2 kg)     General: 85 y.o. male resting comfortably in no acute distress. HEENT: Normocephalic and atraumatic. Sclera clear. EOMs intact. Neck: Supple. No carotid bruits. No JVD. Heart: Bradycardic with regular  rhythm. Distinct S1 and S2. III/VI systolic murmur heard throughout but loudest at upper sternal border with some radiation to carotid arteries. Radial pulses 2+ and equal bilaterally. Lungs: No increased work of breathing. Clear to ausculation bilaterally. No wheezes, rhonchi, or rales.  Abdomen: Soft, non-distended, and non-tender to palpation. Bowel sounds present. MSK: Normal strength and tone for age. Extremities: Mild ankle edema bilaterally.  Skin: Warm and dry. Neuro: Alert and oriented x3. No focal deficits. Psych: Normal affect. Responds appropriately.   Assessment:    1. Coronary artery disease involving native coronary artery of native heart without angina pectoris   2. Hypertrophic cardiomyopathy ([place])   3. Essential hypertension   4. Hyperlipidemia, unspecified hyperlipidemia type     Plan:    CAD without Angina - History of remote PCI to RCA and left circumflex in 1999. - Stable No angina.  - Continue aspirin, beta-blocker, and statin. Aspirin is not on medication list. Patient states he still takes it but not regularly like he should. Recommended patient restart Aspirin 81mg  daily.   Hypertrophic Cardiomyopathy - Most recent Echo from 03/2015 showed stable findings with LVEF of 65-70%, LV outflow tract obstruction with peak and mean gradients of 25 and 14 mmHg, respectively. Also showed aortic valve thickening with minimal restriction of leaflet motion, systolic anterior motion of the mitral valve, and elevated PASP of 51 mmHg. - Appears euvolemic on exam. - Continue Losartan 100mg  daily and Lopressor 25mg  twice daily. - Will recheck Echo to re-evaluate LV outflow tract obstruction given patient is very active for an 85 year old and it has been 4.5 years since last Echo.  Hypertension - BP mildly elevated at 150/72. -  Continue Losartan 100mg  daily and Lopressor 25mg  twice daily. Patient also on Terazosin 10mg  daily. - Asked patient to keep a BP log for the next 2-3  weeks and call us with the results. If BP consistently elevated, may add low dose HCTZ given reports of mild ankle edema. However given age, LV outflow obstruction, and some reports of mild lightheaded/dizziness after taking medications; I also want to be cautious and avoid lowering BP too much.  Hyperlipidemia - Lipid panel form 12/2017: Total Chol 121, Trig 72, HDL 29, LDL 78. - Close to LDL goal of <70 given CAD. - Continue Pravastatin 40mg  daily.  - Labs  followed by PCP.  Disposition: Follow up in 1 year with Dr. Burt Knack.   Medication Adjustments/Labs and Tests Ordered: Current medicines are reviewed at length with the patient today.  Concerns regarding medicines are outlined above.  Orders Placed This Encounter  Procedures  . Basic Metabolic Panel (BMET)  . EKG 12-Lead  . ECHOCARDIOGRAM COMPLETE   Meds ordered this encounter  Medications  . aspirin EC 81 MG tablet    Sig: Take 1 tablet (81 mg total) by mouth daily.    Dispense:       Order Specific Question:   Supervising Provider    Answer:   Skeet Latch [id number]    Patient Instructions  Medication Instructions:  Your physician recommends that you continue on your current medications as directed. Please refer to the Current Medication list given to you today..  *If you need a refill on your cardiac medications before your next appointment, please call your pharmacy*  Lab Work: TODAY: BMET  If you have labs (blood work) drawn today and your tests are completely normal, you will receive your results only by: Marland Kitchen MyChart Message (if you have MyChart) OR . A paper copy in the mail If you have any lab test that is abnormal or we need to change your treatment, we will call you to review the results.  Testing/Procedures: Your physician has requested that you have an echocardiogram. Echocardiography is a painless test that uses sound waves to create images of your heart. It provides your doctor with information about the  size and shape of your heart and how well your heart's chambers and valves are working. This procedure takes approximately one hour. There are no restrictions for this procedure.  Follow-Up: At [facility], you and your health needs are our priority.  As part of our continuing mission to provide you with exceptional heart care, we have created designated Provider Care Teams.  These Care Teams include your primary Cardiologist (physician) and Advanced Practice Providers (APPs -  Physician Assistants and Nurse Practitioners) who all work together to provide you with the care you need, when you need it.  Your next appointment:   12 month(s)  The format for your next appointment:   In Person  Provider:   Sherren Mocha, MD  Other Instructions Please check your blood pressure 1-2 times per day for 2 weeks and send readings to [facility], PA-C.      Signed, Darreld Mclean, PA-C  08/29/2019 3:59 PM    [facility]

## 2019-08-29 ENCOUNTER — Other Ambulatory Visit: Payer: Self-pay

## 2019-08-29 ENCOUNTER — Ambulatory Visit (INDEPENDENT_AMBULATORY_CARE_PROVIDER_SITE_OTHER): Payer: Medicare Other | Admitting: Student

## 2019-08-29 ENCOUNTER — Encounter: Payer: Self-pay | Admitting: Student

## 2019-08-29 VITALS — BP 150/72 | HR 53 | Ht 66.0 in | Wt 180.4 lb

## 2019-08-29 DIAGNOSIS — E785 Hyperlipidemia, unspecified: Secondary | ICD-10-CM

## 2019-08-29 DIAGNOSIS — I422 Other hypertrophic cardiomyopathy: Secondary | ICD-10-CM | POA: Diagnosis not present

## 2019-08-29 DIAGNOSIS — I2510 Atherosclerotic heart disease of native coronary artery without angina pectoris: Secondary | ICD-10-CM | POA: Diagnosis not present

## 2019-08-29 DIAGNOSIS — I517 Cardiomegaly: Secondary | ICD-10-CM | POA: Diagnosis not present

## 2019-08-29 DIAGNOSIS — I10 Essential (primary) hypertension: Secondary | ICD-10-CM

## 2019-08-29 MED ORDER — ASPIRIN EC 81 MG PO TBEC: 81.0000 mg | DELAYED_RELEASE_TABLET | Freq: Every day | ORAL | Status: DC

## 2019-08-29 NOTE — Patient Instructions (Signed)
Medication Instructions:  Your physician recommends that you continue on your current medications as directed. Please refer to the Current Medication list given to you today..  *If you need a refill on your cardiac medications before your next appointment, please call your pharmacy*  Lab Work: TODAY: BMET  If you have labs (blood work) drawn today and your tests are completely normal, you will receive your results only by: Marland Kitchen MyChart Message (if you have MyChart) OR . A paper copy in the mail If you have any lab test that is abnormal or we need to change your treatment, we will call you to review the results.  Testing/Procedures: Your physician has requested that you have an echocardiogram. Echocardiography is a painless test that uses sound waves to create images of your heart. It provides your doctor with information about the size and shape of your heart and how well your heart's chambers and valves are working. This procedure takes approximately one hour. There are no restrictions for this procedure.  Follow-Up: At [facility], you and your health needs are our priority.  As part of our continuing mission to provide you with exceptional heart care, we have created designated Provider Care Teams.  These Care Teams include your primary Cardiologist (physician) and Advanced Practice Providers (APPs -  Physician Assistants and Nurse Practitioners) who all work together to provide you with the care you need, when you need it.  Your next appointment:   12 month(s)  The format for your next appointment:   In Person  Provider:   Sherren Mocha, MD  Other Instructions Please check your blood pressure 1-2 times per day for 2 weeks and send readings to [facility], PA-C.

## 2019-08-30 LAB — BASIC METABOLIC PANEL
BUN/Creatinine Ratio: 17 (ref 10–24)
BUN: 18 mg/dL (ref 8–27)
CO2: 24 mmol/L (ref 20–29)
Calcium: 9.2 mg/dL (ref 8.6–10.2)
Chloride: 105 mmol/L (ref 96–106)
Creatinine, Ser: 1.04 mg/dL (ref 0.76–1.27)
GFR calc Af Amer: 73 mL/min/{1.73_m2} (ref >59)
GFR calc non Af Amer: 63 mL/min/{1.73_m2} (ref >59)
Glucose: 77 mg/dL (ref 65–99)
Potassium: 4.9 mmol/L (ref 3.5–5.2)
Sodium: 143 mmol/L (ref 134–144)

## 2019-09-13 ENCOUNTER — Ambulatory Visit (HOSPITAL_COMMUNITY): Payer: Medicare Other | Attending: Cardiology

## 2019-09-13 ENCOUNTER — Other Ambulatory Visit: Payer: Self-pay

## 2019-09-13 DIAGNOSIS — I422 Other hypertrophic cardiomyopathy: Secondary | ICD-10-CM | POA: Diagnosis not present

## 2019-09-14 ENCOUNTER — Telehealth: Payer: Self-pay | Admitting: Student

## 2019-09-14 NOTE — Telephone Encounter (Addendum)
   Called and discussed Echo results with patient.  Reviewed Echo images with Dr. Burt Knack. Overall, Echo looks OK. EF of 65-70%. He does have severely increased thickness of LV basal septal wall but patient has known hypertrophic cardiomyopathy. LVOT 42 mmHg with moderate aortic stenosis (of note, peak and mean gradients through the LVOT/AV are 25 and 14 mm Hg respectively on last Echo in 2016). Report mentions severe tricuspid regurgitation but Dr. Burt Knack reports this is an error - he does not have severe TR.   Given patient's age and the fact that he was doing well from a cardiac standpoint at last visit, would not make any new recommendations at this time. However, patient knows to contact at [name] if he starts having any symptoms (chest pain, shortness of breath, syncope, etc.).  Patient voiced understanding, agreed, and thanked me for calling.  Darreld Mclean, PA-C 09/14/2019 11:36 AM

## 2019-09-16 ENCOUNTER — Other Ambulatory Visit: Payer: Self-pay | Admitting: Cardiovascular Disease

## 2019-10-22 ENCOUNTER — Ambulatory Visit: Payer: Medicare Other | Attending: Internal Medicine

## 2019-10-22 DIAGNOSIS — Z23 Encounter for immunization: Secondary | ICD-10-CM | POA: Insufficient documentation

## 2019-10-22 NOTE — Progress Notes (Signed)
   Covid-19 Vaccination Clinic  Name:  Paul Mays    MRN: [id number] DOB: 11/16/1927  10/22/2019  [name] was observed post Covid-19 immunization for 15 minutes without incidence. He was provided with Vaccine Information Sheet and instruction to access the V-Safe system.   [name] was instructed to call 911 with any severe reactions post vaccine: Marland Kitchen Difficulty breathing  . Swelling of your face and throat  . A fast heartbeat  . A bad rash all over your body  . Dizziness and weakness    Immunizations Administered    Name Date Dose VIS Date Route   Pfizer COVID-19 Vaccine 10/22/2019 11:28 AM 0.3 mL 09/16/2019 Intramuscular   Manufacturer: [employer], [employer]   Lot: [id number]   [place]: [id number]

## 2019-10-26 ENCOUNTER — Other Ambulatory Visit: Payer: Self-pay | Admitting: Internal Medicine

## 2019-10-26 MED ORDER — ALLOPURINOL 300 MG PO TABS: 300.0000 mg | ORAL_TABLET | Freq: Every day | ORAL | 0 refills | Status: DC | PRN

## 2019-10-26 NOTE — Telephone Encounter (Signed)
allopurinol (ZYLOPRIM) 300 MG tablet  Pt was wanting to get about a dozen of these until his script comes in from the [place]. He says he must take one everyday.  [facility] [phone] - [place], [place] Phone:  [phone]  Fax:  [phone]

## 2019-11-08 ENCOUNTER — Ambulatory Visit (INDEPENDENT_AMBULATORY_CARE_PROVIDER_SITE_OTHER): Payer: Medicare Other

## 2019-11-08 ENCOUNTER — Ambulatory Visit (INDEPENDENT_AMBULATORY_CARE_PROVIDER_SITE_OTHER): Payer: Medicare Other | Admitting: Family Medicine

## 2019-11-08 ENCOUNTER — Other Ambulatory Visit: Payer: Self-pay

## 2019-11-08 ENCOUNTER — Encounter: Payer: Self-pay | Admitting: Family Medicine

## 2019-11-08 VITALS — BP 122/66 | HR 58 | Ht 66.0 in | Wt 182.0 lb

## 2019-11-08 DIAGNOSIS — G8929 Other chronic pain: Secondary | ICD-10-CM | POA: Diagnosis not present

## 2019-11-08 DIAGNOSIS — M75101 Unspecified rotator cuff tear or rupture of right shoulder, not specified as traumatic: Secondary | ICD-10-CM

## 2019-11-08 DIAGNOSIS — M25512 Pain in left shoulder: Secondary | ICD-10-CM

## 2019-11-08 DIAGNOSIS — M75102 Unspecified rotator cuff tear or rupture of left shoulder, not specified as traumatic: Secondary | ICD-10-CM | POA: Diagnosis not present

## 2019-11-08 DIAGNOSIS — M12811 Other specific arthropathies, not elsewhere classified, right shoulder: Secondary | ICD-10-CM

## 2019-11-08 DIAGNOSIS — M25511 Pain in right shoulder: Secondary | ICD-10-CM

## 2019-11-08 DIAGNOSIS — M12812 Other specific arthropathies, not elsewhere classified, left shoulder: Secondary | ICD-10-CM | POA: Diagnosis not present

## 2019-11-08 NOTE — Patient Instructions (Signed)
Good to see you  Ice is your friend Stay active See me again in 10 weeks

## 2019-11-08 NOTE — Progress Notes (Signed)
[place] Phone: [phone] Subjective:   Paul Mays, am serving as a scribe for Dr. Hulan Saas. This visit occurred during the SARS-CoV-2 public health emergency.  Safety protocols were in place, including screening questions prior to the visit, additional usage of staff PPE, and extensive cleaning of exam room while observing appropriate contact time as indicated for disinfecting solutions.   I'm seeing this patient by the request  of:  Binnie Rail, MD  CC: Bilateral shoulder pain  [id number]   11/10/2018 Bilateral injections given today.  Discussed icing regimen and home exercise.  Discussed which activities to do which wants avoid patient does have significant arthritic changes of the shoulders bilaterally.  Mays significant change in management: We will just continue with the injections every 3 months as long as they continue to help.  Patient is agreement with the plan  Update 11/08/2019 Paul Mays is a 86 y.o. male coming in with complaint of bilateral shoulder pain. Patient states that he is lacking range motion. Unable to comb hair or take jacket on and off. Is getting 2nd COVID injection on Friday.  Patient has known severe osteoarthritic changes.  Reviewed patient's chart with multiple different office visits as well as the injections over the course of several years.  Patient has known arthritic changes.  Severe nearly bone-on-bone.    Past Medical History:  Diagnosis Date  . Anxiety   . Aortic stenosis   . Arthralgia   . Arthritis   . CAD (coronary artery disease)   . Carotid stenosis   . Cervicalgia   . CLUSTER HEADACHE 02/10/2007   Qualifier: Diagnosis of  By: Linna Darner MD, Gwyndolyn Saxon    . Colitis   . Edema   . Gout   . Hearing loss   . Herpes zoster without mention of complication   . Hyperlipidemia   . Hypertension   . Hyperuricemia   . Impacted cerumen   . Migraine   . Migraine    . Murmur   . Olecranon bursitis   . OSA (obstructive sleep apnea)   . Other and unspecified hyperlipidemia   . Other malaise and fatigue   . Peripheral neuropathy   . Polyneuropathy   . Pulmonary hypertension ([place])   . Unspecified hereditary and idiopathic peripheral neuropathy   . Venous insufficiency    Past Surgical History:  Procedure Laterality Date  . ANGIOPLASTY     w/ 2 stents in 1999  . APPENDECTOMY    . HEMORRHOID SURGERY    . LIPOMA EXCISION    . TONSILLECTOMY     Social History   Socioeconomic History  . Marital status: Married    Spouse name: [name]  . Number of children: 2  . Years of education: college  . Highest education level: Not on file  Occupational History  . 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 Smoking status: Never Smoker  . Smokeless tobacco: Never Used  Substance and Sexual Activity  . Alcohol use: Yes    Alcohol/week: 0.0 standard drinks    Comment: 2 drink before dinner vodka   . Drug use: Mays  . Sexual activity: Never  Other Topics Concern  . Not on file  Social History Narrative   Pt is married and lives with wife (suzanne). Patient finished  two years of college.    Pt has  children.   Caffeine -two cups daily.   Left handed.   Retired Licensed conveyancer                    Social Determinants of Radio broadcast assistant Strain:   . Difficulty of Paying Living Expenses: Not on file  Food Insecurity:   . Worried About Charity fundraiser in the Last Year: Not on file  . Ran Out of Food in the Last Year: Not on file  Transportation Needs:   . Lack of Transportation (Medical): Not on file  . Lack of Transportation (Non-Medical): Not on file  Physical Activity:   . Days of Exercise per Week: Not on file  . Minutes of Exercise per Session: Not on file  Stress:   . Feeling of Stress : Not on file  Social Connections:   . Frequency of Communication with  Friends and Family: Not on file  . Frequency of Social Gatherings with Friends and Family: Not on file  . Attends Religious Services: Not on file  . Active Member of Clubs or Organizations: Not on file  . Attends Archivist Meetings: Not on file  . Marital Status: Not on file   Allergies  Allergen Reactions  . Lisinopril Other (See Comments)    Hyperkalemia in the context of combined spironolactone and lisinopril therapy  . Spironolactone Other (See Comments)    Hyperkalemia in context of spironolactone and lisinopril therapy   Family History  Problem Relation Age of Onset  . Stroke Father        Died, 83  . High blood pressure Father   . Diabetes Father   . Other Mother        Died, 88  . Diabetes Brother        Died, 82  . Diabetes Sister        Living, 11  . Healthy Daughter   . Healthy Son      Current Outpatient Medications (Cardiovascular):  .  losartan (COZAAR) 100 MG tablet, Take 1 tablet (100 mg total) by mouth daily. Please make annual Dr. Burt Knack for further refills. Thank you .  metoprolol (LOPRESSOR) 50 MG tablet, Take 25 mg by mouth 2 (two) times daily. 1/2 tab by mouth two times a day.  .  pravastatin (PRAVACHOL) 40 MG tablet, Take 40 mg by mouth daily.   Marland Kitchen  terazosin (HYTRIN) 10 MG capsule, Take 12 mg by mouth at bedtime.   Current Outpatient Medications (Analgesics):  .  allopurinol (ZYLOPRIM) 300 MG tablet, Take 1 tablet (300 mg total) by mouth daily as needed (gout flare). Marland Kitchen  aspirin EC 81 MG tablet, Take 1 tablet (81 mg total) by mouth daily. .  naproxen sodium (ALEVE) 220 MG tablet, Take 220 mg by mouth as needed.  .  traMADol (ULTRAM) 50 MG tablet, Take 1 tablet (50 mg total) by mouth every 8 (eight) hours as needed for moderate pain (for chronic shoulder pain).   Current Outpatient Medications (Other):  Marland Kitchen  Cholecalciferol (VITAMIN D3) 2000 UNITS capsule, Take 1 capsule (2,000 Units total) by mouth daily. .  Flavocoxid (LIMBREL) 500 MG  CAPS, Take 500 mg by mouth daily. Marland Kitchen  gabapentin (NEURONTIN) 600 MG tablet, Take 1 tablet (600 mg total) by mouth 2 (two) times daily.   Reviewed prior external information including notes and imaging from  primary care provider As well as notes that were available from care everywhere and other healthcare systems.  Past medical history, social, surgical and family history all reviewed in electronic medical record.  Mays pertanent information unless stated regarding to the chief complaint.   Review of Systems:  Mays headache, visual changes, nausea, vomiting, diarrhea, constipation, dizziness, abdominal pain, skin rash, fevers, chills, night sweats, weight loss, swollen lymph nodes, , chest pain, shortness of breath, mood changes. POSITIVE muscle aches   Objective  Blood pressure 122/66, pulse (!) 58, height 5\' 6"  (1.676 m), weight 182 lb (82.6 kg), SpO2 97 %.   General: Mays apparent distress alert and oriented x3 mood and affect normal, dressed appropriately.  HEENT: Pupils equal, extraocular movements intact  Respiratory: Patient's speak in full sentences and does not appear short of breath  Cardiovascular: Mays lower extremity edema, non tender, Mays erythema  Skin: Warm dry intact with Mays signs of infection or rash on extremities or on axial skeleton.  Abdomen: Soft nontender  Neuro: Cranial nerves II through XII are intact, neurovascularly intact in all extremities with 2+ DTRs and 2+ pulses.  Lymph: Mays lymphadenopathy of posterior or anterior cervical chain or axillae bilaterally.  Gait normal with good balance and coordination.  MSK: Bilateral shoulder exam shows the patient does have atrophy of the shoulders bilaterally.  Patient does have crepitus and decreased range of motion bilaterally.  Patient rotator cuff strength is 3+ out of 5  Procedure: Real-time Ultrasound Guided Injection of right glenohumeral joint Device: GE Logiq Q7  Ultrasound guided injection is preferred based studies  that show increased duration, increased effect, greater accuracy, decreased procedural pain, increased response rate with ultrasound guided versus blind injection.  Verbal informed consent obtained.  Time-out conducted.  Noted Mays overlying erythema, induration, or other signs of local infection.  Skin prepped in a sterile fashion.  Local anesthesia: Topical Ethyl chloride.  With sterile technique and under real time ultrasound guidance:  Joint visualized.  23g 1  inch needle inserted posterior approach. Pictures taken for needle placement. Patient did have injection of 2 cc of 1% lidocaine, 2 cc of 0.5% Marcaine, and 1.0 cc of Kenalog 40 mg/dL. Completed without difficulty  Pain immediately resolved suggesting accurate placement of the medication.  Advised to call if fevers/chills, erythema, induration, drainage, or persistent bleeding.  Images permanently stored and available for review in the ultrasound unit.  Impression: Technically successful ultrasound guided injection.  Procedure: Real-time Ultrasound Guided Injection of left glenohumeral joint Device: GE Logiq E  Ultrasound guided injection is preferred based studies that show increased duration, increased effect, greater accuracy, decreased procedural pain, increased response rate with ultrasound guided versus blind injection.  Verbal informed consent obtained.  Time-out conducted.  Noted Mays overlying erythema, induration, or other signs of local infection.  Skin prepped in a sterile fashion.  Local anesthesia: Topical Ethyl chloride.  With sterile technique and under real time ultrasound guidance:  Joint visualized.  21g 2 inch needle inserted posterior approach. Pictures taken for needle placement. Patient did have injection of 2 cc of 0.5% Marcaine, and 1cc of Kenalog 40 mg/dL. Completed without difficulty  Pain immediately resolved suggesting accurate placement of the medication.  Advised to call if fevers/chills, erythema,  induration, drainage, or persistent bleeding.  Images permanently stored and available for review in the ultrasound unit.  Impression: Technically successful ultrasound guided injection.     Impression and Recommendations:     This case required medical decision making of moderate complexity. The above documentation has been reviewed and is accurate and complete Paul Pulley, DO  Note: This dictation was prepared with Dragon dictation along with smaller phrase technology. Any transcriptional errors that result from this process are unintentional.

## 2019-11-08 NOTE — Assessment & Plan Note (Signed)
Patient was nearly gone for 1 year.  Had been doing very well.  Discussed icing regimen and home exercise.  Due to patient's other chronic comorbidities I do think that this is likely the safest route for this individual.  Patient does also get the best benefit out of it.  Discussed icing regimen and home exercises.  Follow-up with me again in 10 weeks if necessary

## 2019-11-11 ENCOUNTER — Ambulatory Visit: Payer: Medicare Other | Attending: Internal Medicine

## 2019-11-11 DIAGNOSIS — Z23 Encounter for immunization: Secondary | ICD-10-CM | POA: Insufficient documentation

## 2019-11-11 NOTE — Progress Notes (Signed)
   Covid-19 Vaccination Clinic  Name:  Paul Mays    MRN: [id number] DOB: [birth year]  11/11/2019  [name] was observed post Covid-19 immunization for 15 minutes without incidence. He was provided with Vaccine Information Sheet and instruction to access the V-Safe system.   [name] was instructed to call 911 with any severe reactions post vaccine: Marland Kitchen Difficulty breathing  . Swelling of your face and throat  . A fast heartbeat  . A bad rash all over your body  . Dizziness and weakness    Immunizations Administered    Name Date Dose VIS Date Route   Pfizer COVID-19 Vaccine 11/11/2019  2:25 PM 0.3 mL 09/16/2019 Intramuscular   Manufacturer: [place]   Lot: [id number]   [place]: [id number]

## 2019-12-15 ENCOUNTER — Other Ambulatory Visit: Payer: Self-pay | Admitting: Cardiovascular Disease

## 2020-01-02 ENCOUNTER — Telehealth: Payer: Self-pay

## 2020-01-02 DIAGNOSIS — M25519 Pain in unspecified shoulder: Secondary | ICD-10-CM

## 2020-01-02 NOTE — Telephone Encounter (Signed)
New message   The patient call needs a referral [facility] reason: shoulder area   Fx [phone]

## 2020-01-02 NOTE — Telephone Encounter (Signed)
Referral ordered

## 2020-01-02 NOTE — Telephone Encounter (Signed)
OV

## 2020-01-12 DIAGNOSIS — M25511 Pain in right shoulder: Secondary | ICD-10-CM | POA: Diagnosis not present

## 2020-01-12 DIAGNOSIS — M25512 Pain in left shoulder: Secondary | ICD-10-CM | POA: Diagnosis not present

## 2020-01-17 ENCOUNTER — Ambulatory Visit: Payer: Medicare Other | Admitting: Family Medicine

## 2020-01-17 DIAGNOSIS — M25512 Pain in left shoulder: Secondary | ICD-10-CM | POA: Diagnosis not present

## 2020-01-17 DIAGNOSIS — M25511 Pain in right shoulder: Secondary | ICD-10-CM | POA: Diagnosis not present

## 2020-01-19 DIAGNOSIS — M25511 Pain in right shoulder: Secondary | ICD-10-CM | POA: Diagnosis not present

## 2020-01-19 DIAGNOSIS — M25512 Pain in left shoulder: Secondary | ICD-10-CM | POA: Diagnosis not present

## 2020-01-25 ENCOUNTER — Ambulatory Visit: Payer: Medicare Other | Admitting: Internal Medicine

## 2020-01-25 NOTE — Progress Notes (Signed)
Subjective:    Patient ID: Paul Mays, male    DOB: [birth year], 86 y.o.   MRN: [id number]  HPI The patient is here for an acute visit.   Ankle swelling:  He has been experiencing b/l ankle swelling for weeks.  He states first thing in the morning the swelling is gone, but it gets worse as the day goes on.  He denies any pain.   He denies SOB, CP, palps.  He is not compliant with a low sodium diet.  He does not elevate his legs during the day.     Echo 09/2019:  EF 65-70%, grade 2 DD, LVH/hypertrophic cmp, LAE, RAE, mild MR, severe TR, mod, mod AR, mod AS, mild PR, pulm htn   He has been experiencing GERD daily and his food gets stuck in his lower chest he has an appointment with Dr Henrene Pastor next month.   Medications and allergies reviewed with patient and updated if appropriate.  Patient Active Problem List   Diagnosis Date Noted  . Chronic pain of both shoulders 12/10/2018  . Easy bruising 03/20/2018  . Decreased hearing of right ear 09/04/2017  . Rotator cuff tear arthropathy of both shoulders 10/18/2015  . Thrombocytopenia ([place]) 09/24/2015  . Diverticulosis of colon without hemorrhage 07/02/2014  . Hypertrophic cardiomyopathy ([place]) 03/24/2011  . OBSTRUCTIVE SLEEP APNEA 11/08/2010  . HYPERTENSION, PULMONARY 09/16/2010  . HEARING LOSS, BILATERAL 06/18/2009  . Coronary atherosclerosis 03/07/2009  . Aortic valve disorder, mod AR, mod AS 03/07/2009  . CAROTID STENOSIS 03/07/2009  . Hyperlipidemia 11/15/2008  . Peripheral neuropathy, idiopathic 11/15/2008  . Essential hypertension 11/15/2008  . ARTHRALGIA 11/15/2008  . COLITIS, HX OF 11/15/2008  . VENOUS INSUFFICIENCY 04/20/2007  . Edema 04/20/2007  . Gout 02/10/2007  . PSTPRC STATUS, PTCA 02/10/2007    Current Outpatient Medications on File Prior to Visit  Medication Sig Dispense Refill  . allopurinol (ZYLOPRIM) 300 MG tablet Take 1 tablet (300 mg total) by mouth daily as needed (gout flare). 15 tablet 0  . aspirin EC  81 MG tablet Take 1 tablet (81 mg total) by mouth daily.    . Cholecalciferol (VITAMIN D3) 2000 UNITS capsule Take 1 capsule (2,000 Units total) by mouth daily.    . Flavocoxid (LIMBREL) 500 MG CAPS Take 500 mg by mouth daily.    Marland Kitchen gabapentin (NEURONTIN) 600 MG tablet Take 1 tablet (600 mg total) by mouth 2 (two) times daily. 60 tablet 0  . losartan (COZAAR) 100 MG tablet TAKE 1 TABLET DAILY 90 tablet 2  . metoprolol (LOPRESSOR) 50 MG tablet Take 25 mg by mouth 2 (two) times daily. 1/2 tab by mouth two times a day.     . naproxen sodium (ALEVE) 220 MG tablet Take 220 mg by mouth as needed.     . pravastatin (PRAVACHOL) 40 MG tablet Take 40 mg by mouth daily.      Marland Kitchen terazosin (HYTRIN) 10 MG capsule Take 12 mg by mouth at bedtime.    . traMADol (ULTRAM) 50 MG tablet Take 1 tablet (50 mg total) by mouth every 8 (eight) hours as needed for moderate pain (for chronic shoulder pain). 45 tablet 1   No current facility-administered medications on file prior to visit.    Past Medical History:  Diagnosis Date  . Anxiety   . Aortic stenosis   . Arthralgia   . Arthritis   . CAD (coronary artery disease)   . Carotid stenosis   . Cervicalgia   .  CLUSTER HEADACHE 02/10/2007   Qualifier: Diagnosis of  By: Linna Darner MD, Gwyndolyn Saxon    . Colitis   . Edema   . Gout   . Hearing loss   . Herpes zoster without mention of complication   . Hyperlipidemia   . Hypertension   . Hyperuricemia   . Impacted cerumen   . Migraine   . Migraine   . Murmur   . Olecranon bursitis   . OSA (obstructive sleep apnea)   . Other and unspecified hyperlipidemia   . Other malaise and fatigue   . Peripheral neuropathy   . Polyneuropathy   . Pulmonary hypertension ([place])   . Unspecified hereditary and idiopathic peripheral neuropathy   . Venous insufficiency     Past Surgical History:  Procedure Laterality Date  . ANGIOPLASTY     w/ 2 stents in 1999  . APPENDECTOMY    . HEMORRHOID SURGERY    . LIPOMA EXCISION    .  TONSILLECTOMY      Social History   Socioeconomic History  . Marital status: Married    Spouse name: [name]  . Number of children: 2  . Years of education: college  . Highest education level: Not on file  Occupational History  . 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 Smoking status: Never Smoker  . Smokeless tobacco: Never Used  Substance and Sexual Activity  . Alcohol use: Yes    Alcohol/week: 0.0 standard drinks    Comment: 2 drink before dinner vodka   . Drug use: No  . Sexual activity: Never  Other Topics Concern  . Not on file  Social History Narrative   Pt is married and lives with wife (suzanne). Patient finished  two years of college.    Pt has children.   Caffeine -two cups daily.   Left handed.   Retired Licensed conveyancer                    Social Determinants of Radio broadcast assistant Strain:   . Difficulty of Paying Living Expenses:   Food Insecurity:   . Worried About Charity fundraiser in the Last Year:   . Arboriculturist in the Last Year:   Transportation Needs:   . Film/video editor (Medical):   Marland Kitchen Lack of Transportation (Non-Medical):   Physical Activity:   . Days of Exercise per Week:   . Minutes of Exercise per Session:   Stress:   . Feeling of Stress :   Social Connections:   . Frequency of Communication with Friends and Family:   . Frequency of Social Gatherings with Friends and Family:   . Attends Religious Services:   . Active Member of Clubs or Organizations:   . Attends Archivist Meetings:   Marland Kitchen Marital Status:     Family History  Problem Relation Age of Onset  . Stroke Father        Died, 83  . High blood pressure Father   . Diabetes Father   . Other Mother        Died, 88  . Diabetes Brother        Died, 82  . Diabetes Sister        Living, 42  . Healthy Daughter   . Healthy Son     Review of Systems  Constitutional: Negative for  chills and fever.  Respiratory: Negative for cough, shortness of breath and wheezing.   Cardiovascular: Positive for leg swelling. Negative for chest pain and palpitations.       Objective:   Vitals:   01/26/20 1044  BP: (!) 120/58  Pulse: 73  Resp: 16  Temp: 98.2 F (36.8 C)  SpO2: 98%   BP Readings from Last 3 Encounters:  01/26/20 (!) 120/58  11/08/19 122/66  08/29/19 (!) 150/72   Wt Readings from Last 3 Encounters:  01/26/20 175 lb (79.4 kg)  11/08/19 182 lb (82.6 kg)  08/29/19 180 lb 6.4 oz (81.8 kg)   Body mass index is 28.25 kg/m.   Physical Exam Constitutional:      General: He is not in acute distress.    Appearance: Normal appearance. He is not ill-appearing.  HENT:     Head: Normocephalic and atraumatic.  Cardiovascular:     Rate and Rhythm: Normal rate and regular rhythm.     Heart sounds: Murmur (3/6 systolic murmur) present.  Pulmonary:     Effort: Pulmonary effort is normal. No respiratory distress.     Breath sounds: Normal breath sounds. No wheezing or rales.  Musculoskeletal:     Right lower leg: Edema (2+ pitting) present.     Left lower leg: Edema (2+ pitting) present.  Skin:    General: Skin is warm and dry.     Findings: No erythema or lesion.  Neurological:     Mental Status: He is alert.            Assessment & Plan:    See Problem List for Assessment and Plan of chronic medical problems.    This visit occurred during the SARS-CoV-2 public health emergency.  Safety protocols were in place, including screening questions prior to the visit, additional usage of staff PPE, and extensive cleaning of exam room while observing appropriate contact time as indicated for disinfecting solutions.

## 2020-01-26 ENCOUNTER — Other Ambulatory Visit: Payer: Self-pay

## 2020-01-26 ENCOUNTER — Ambulatory Visit (INDEPENDENT_AMBULATORY_CARE_PROVIDER_SITE_OTHER): Payer: Medicare Other | Admitting: Internal Medicine

## 2020-01-26 ENCOUNTER — Encounter: Payer: Self-pay | Admitting: Internal Medicine

## 2020-01-26 DIAGNOSIS — K219 Gastro-esophageal reflux disease without esophagitis: Secondary | ICD-10-CM | POA: Diagnosis not present

## 2020-01-26 DIAGNOSIS — R600 Localized edema: Secondary | ICD-10-CM

## 2020-01-26 MED ORDER — OMEPRAZOLE 20 MG PO CPDR: DELAYED_RELEASE_CAPSULE | ORAL | 3 refills | Status: DC

## 2020-01-26 NOTE — Patient Instructions (Addendum)
Medications reviewed and updated.  Changes include :   none   Elevate your legs  Start wearing compression socks  Try to decrease your salt intake  If the swelling does not improve we can consider a water pill.

## 2020-01-26 NOTE — Assessment & Plan Note (Signed)
Acute For several weeks, no symptoms suggestive of CHF or other concerning cause Goes for blood work next week at the [place] [facility] likely related to chronic venous insufficiency, high salt diet ( gets take out or has family bring him food) Elevate legs when sitting, stressed low sodium diet, start wearing compression socks daily Will try to avoid diuretic if possible - if conservative measures do not help will discuss starting a diuretic

## 2020-01-26 NOTE — Assessment & Plan Note (Signed)
Acute Having gerd daily and some trouble swallowing with pain Sees Dr Henrene Pastor next month Start omeprazole 20 mg - 2 tabs daily for one month and then one tab daily Will see GI next month

## 2020-01-30 ENCOUNTER — Encounter: Payer: Self-pay | Admitting: Family Medicine

## 2020-01-30 ENCOUNTER — Ambulatory Visit: Payer: Self-pay

## 2020-01-30 ENCOUNTER — Ambulatory Visit (INDEPENDENT_AMBULATORY_CARE_PROVIDER_SITE_OTHER): Payer: Medicare Other | Admitting: Family Medicine

## 2020-01-30 ENCOUNTER — Other Ambulatory Visit: Payer: Self-pay

## 2020-01-30 VITALS — BP 104/50 | HR 62 | Ht 66.0 in | Wt 176.0 lb

## 2020-01-30 DIAGNOSIS — M25512 Pain in left shoulder: Secondary | ICD-10-CM | POA: Diagnosis not present

## 2020-01-30 DIAGNOSIS — G8929 Other chronic pain: Secondary | ICD-10-CM

## 2020-01-30 DIAGNOSIS — M25511 Pain in right shoulder: Secondary | ICD-10-CM | POA: Diagnosis not present

## 2020-01-30 NOTE — Progress Notes (Signed)
I, Paul Mays, LAT, ATC, am serving as scribe for Dr. Lynne Leader.  Paul Mays is a 86 y.o. male who presents to [facility] today for f/u of chronic B shoulder pain.  He was last seen by Dr. Tamala Julian on 11/08/19 and had B GHJ injections.  Since his last visit, pt reports worsening B shoulder pain.  He con't to have difficulty w/ ADLs and IADLs like combing his hair and donning/doffing shirts and jackets.  He has tried PT which was unsuccessful and made his B shoulder pain worse.  He is left-hand dominant but his right shoulder is worse.  He would like to avoid shoulder replacement but is willing to consider it if needed in the future.   Pertinent review of systems: No fevers or chills  Relevant historical information: Hypertrophic cardiomyopathy   Exam:  BP (!) 104/50 (BP Location: Right Arm, Patient Position: Sitting, Cuff Size: Normal)   Pulse 62   Ht 5\' 6"  (1.676 m)   Wt 176 lb (79.8 kg)   SpO2 95%   BMI 28.41 kg/m  General: Well Developed, well nourished, and in no acute distress.   MSK: Right shoulder decreased range of motion with pain. Left shoulder decreased range of motion limited by pain.    Lab and Radiology Results  Procedure: Real-time Ultrasound Guided Injection of right shoulder glenohumeral joint Device: Philips Affiniti 50G Images permanently stored and available for review in the ultrasound unit. Verbal informed consent obtained.  Discussed risks and benefits of procedure. Warned about infection bleeding damage to structures skin hypopigmentation and fat atrophy among others. Patient expresses understanding and agreement Time-out conducted.   Noted no overlying erythema, induration, or other signs of local infection.   Skin prepped in a sterile fashion.   Local anesthesia: Topical Ethyl chloride.   With sterile technique and under real time ultrasound guidance:  40 mg of Kenalog and 2 mL of Marcaine injected easily.    Completed without difficulty   Pain immediately resolved suggesting accurate placement of the medication.   Advised to call if fevers/chills, erythema, induration, drainage, or persistent bleeding.   Images permanently stored and available for review in the ultrasound unit.  Impression: Technically successful ultrasound guided injection.  Procedure: Real-time Ultrasound Guided Injection of left shoulder glenohumeral joint Device: Philips Affiniti 50G Images permanently stored and available for review in the ultrasound unit. Verbal informed consent obtained.  Discussed risks and benefits of procedure. Warned about infection bleeding damage to structures skin hypopigmentation and fat atrophy among others. Patient expresses understanding and agreement Time-out conducted.   Noted no overlying erythema, induration, or other signs of local infection.   Skin prepped in a sterile fashion.   Local anesthesia: Topical Ethyl chloride.   With sterile technique and under real time ultrasound guidance:  40 mg of Kenalog and 2 mL of Marcaine injected easily.   Completed without difficulty   Pain immediately resolved suggesting accurate placement of the medication.   Advised to call if fevers/chills, erythema, induration, drainage, or persistent bleeding.   Images permanently stored and available for review in the ultrasound unit.  Impression: Technically successful ultrasound guided injection.       Assessment and Plan: 86 y.o. male with bilateral shoulder pain due to DJD.  Plan for glenohumeral injection.  Is been about 3 months since his last series of shots.  We will proceed with somewhat regularly scheduled injections as needed.  Cautioned patient that ultimately these may stop  working and at that point total shoulder replacement may be his best option.  Mention that there are some other potential injections like PRP which I am not optimistic about for him.  Recheck back as needed.  Return sooner  if needed.    Orders Placed This Encounter  Procedures  . [name] LIMITED JOINT SPACE STRUCTURES UP BILAT(NO LINKED CHARGES)    Order Specific Question:   Reason for Exam (SYMPTOM  OR DIAGNOSIS REQUIRED)    Answer:   B shoulder pain    Order Specific Question:   Preferred imaging location?    Answer:   [place]   No orders of the defined types were placed in this encounter.    Discussed warning signs or symptoms. Please see discharge instructions. Patient expresses understanding.   The above documentation has been reviewed and is accurate and complete Lynne Leader

## 2020-01-30 NOTE — Patient Instructions (Signed)
Thank you for coming in today. Call or go to the ER if you develop a large red swollen joint with extreme pain or oozing puss.  Generally we give it 3 months between injections.

## 2020-02-02 NOTE — Progress Notes (Signed)
Subjective:    Patient ID: Paul Mays, male    DOB: May 16, 1928, 86 y.o.   MRN: [id number]  HPI The patient is here for an acute visit.   B/l leg edema and legs draining:  His leg swelling has not improved and he thinks it may be worse.  His legs have started to drain clear fluid in his pants and his sheets at night are wet.  He denies any leg pain.  He does have some bruising and redness in his legs in different area.  He injured his left lower leg taking off the compression socks.  He only use them once, but they were too tight and too difficult to get on and off.  He denies any shortness of breath, chest pain, fevers or chills.  He has had some lightheadedness.  He denies only having lightheadedness when he stands.  He states he feels a little lightheaded now.   He states he has been elevating his legs and thinks he has been better with decreasing the salt in his diet.  Medications and allergies reviewed with patient and updated if appropriate.  Patient Active Problem List   Diagnosis Date Noted  . Bilateral leg edema 01/26/2020  . GERD (gastroesophageal reflux disease) 01/26/2020  . Chronic pain of both shoulders 12/10/2018  . Easy bruising 03/20/2018  . Decreased hearing of right ear 09/04/2017  . Rotator cuff tear arthropathy of both shoulders 10/18/2015  . Thrombocytopenia ([place]) 09/24/2015  . Diverticulosis of colon without hemorrhage 07/02/2014  . Hypertrophic cardiomyopathy ([place]) 03/24/2011  . OBSTRUCTIVE SLEEP APNEA 11/08/2010  . HYPERTENSION, PULMONARY 09/16/2010  . HEARING LOSS, BILATERAL 06/18/2009  . Coronary atherosclerosis 03/07/2009  . Aortic valve disorder, mod AR, mod AS 03/07/2009  . CAROTID STENOSIS 03/07/2009  . Hyperlipidemia 11/15/2008  . Peripheral neuropathy, idiopathic 11/15/2008  . Essential hypertension 11/15/2008  . ARTHRALGIA 11/15/2008  . COLITIS, HX OF 11/15/2008  . VENOUS INSUFFICIENCY 04/20/2007  . Edema 04/20/2007  . Gout 02/10/2007   . PSTPRC STATUS, PTCA 02/10/2007    Current Outpatient Medications on File Prior to Visit  Medication Sig Dispense Refill  . aspirin EC 81 MG tablet Take 1 tablet (81 mg total) by mouth daily.    . Cholecalciferol (VITAMIN D3) 2000 UNITS capsule Take 1 capsule (2,000 Units total) by mouth daily.    . Flavocoxid (LIMBREL) 500 MG CAPS Take 500 mg by mouth daily.    Marland Kitchen gabapentin (NEURONTIN) 600 MG tablet Take 1 tablet (600 mg total) by mouth 2 (two) times daily. 60 tablet 0  . losartan (COZAAR) 100 MG tablet TAKE 1 TABLET DAILY 90 tablet 2  . metoprolol (LOPRESSOR) 50 MG tablet Take 25 mg by mouth 2 (two) times daily. 1/2 tab by mouth two times a day.     . naproxen sodium (ALEVE) 220 MG tablet Take 220 mg by mouth as needed.     Marland Kitchen omeprazole (PRILOSEC) 20 MG capsule Take two tabs daily for one month, then one tab daily 60 capsule 3  . pravastatin (PRAVACHOL) 40 MG tablet Take 40 mg by mouth daily.      Marland Kitchen terazosin (HYTRIN) 10 MG capsule Take 12 mg by mouth at bedtime.    . traMADol (ULTRAM) 50 MG tablet Take 1 tablet (50 mg total) by mouth every 8 (eight) hours as needed for moderate pain (for chronic shoulder pain). 45 tablet 1   No current facility-administered medications on file prior to visit.    Past Medical  History:  Diagnosis Date  . Anxiety   . Aortic stenosis   . Arthralgia   . Arthritis   . CAD (coronary artery disease)   . Carotid stenosis   . Cervicalgia   . CLUSTER HEADACHE 02/10/2007   Qualifier: Diagnosis of  By: Linna Darner MD, Gwyndolyn Saxon    . Colitis   . Edema   . Gout   . Hearing loss   . Herpes zoster without mention of complication   . Hyperlipidemia   . Hypertension   . Hyperuricemia   . Impacted cerumen   . Migraine   . Migraine   . Murmur   . Olecranon bursitis   . OSA (obstructive sleep apnea)   . Other and unspecified hyperlipidemia   . Other malaise and fatigue   . Peripheral neuropathy   . Polyneuropathy   . Pulmonary hypertension ([place])   .  Unspecified hereditary and idiopathic peripheral neuropathy   . Venous insufficiency     Past Surgical History:  Procedure Laterality Date  . ANGIOPLASTY     w/ 2 stents in 1999  . APPENDECTOMY    . HEMORRHOID SURGERY    . LIPOMA EXCISION    . TONSILLECTOMY      Social History   Socioeconomic History  . Marital status: Married    Spouse name: [name]  . Number of children: 2  . Years of education: college  . Highest education level: Not on file  Occupational History  . 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 Smoking status: Never Smoker  . Smokeless tobacco: Never Used  Substance and Sexual Activity  . Alcohol use: Yes    Alcohol/week: 0.0 standard drinks    Comment: 2 drink before dinner vodka   . Drug use: No  . Sexual activity: Never  Other Topics Concern  . Not on file  Social History Narrative   Pt is married and lives with wife (suzanne). Patient finished  two years of college.    Pt has children.   Caffeine -two cups daily.   Left handed.   Retired Licensed conveyancer                    Social Determinants of Radio broadcast assistant Strain:   . Difficulty of Paying Living Expenses:   Food Insecurity:   . Worried About Charity fundraiser in the Last Year:   . Arboriculturist in the Last Year:   Transportation Needs:   . Film/video editor (Medical):   Marland Kitchen Lack of Transportation (Non-Medical):   Physical Activity:   . Days of Exercise per Week:   . Minutes of Exercise per Session:   Stress:   . Feeling of Stress :   Social Connections:   . Frequency of Communication with Friends and Family:   . Frequency of Social Gatherings with Friends and Family:   . Attends Religious Services:   . Active Member of Clubs or Organizations:   . Attends Archivist Meetings:   Marland Kitchen Marital Status:     Family History  Problem Relation Age of Onset  . Stroke Father        Died, 83  .  High blood pressure Father   . Diabetes Father   . Other Mother        Died, 88  . Diabetes Brother  Died, 82  . Diabetes Sister        Living, 37  . Healthy Daughter   . Healthy Son     Review of Systems  Constitutional: Negative for chills and fever.  Respiratory: Negative for cough, shortness of breath and wheezing.   Cardiovascular: Positive for palpitations and leg swelling. Negative for chest pain.       No orthopnea  Neurological: Positive for light-headedness.       Objective:   Vitals:   02/03/20 1336  BP: (!) 112/54  Pulse: (!) 53  Temp: 97.7 F (36.5 C)  SpO2: 97%   BP Readings from Last 3 Encounters:  02/03/20 (!) 112/54  01/30/20 (!) 104/50  01/26/20 (!) 120/58   Wt Readings from Last 3 Encounters:  02/03/20 177 lb (80.3 kg)  01/30/20 176 lb (79.8 kg)  01/26/20 175 lb (79.4 kg)   Body mass index is 28.57 kg/m.   Physical Exam    Constitutional: Appears well-developed and well-nourished. No distress.  Head: Normocephalic and atraumatic.  Cardiovascular: Normal rate, regular rhythm and normal heart sounds.  3/6 systolic murmur heard.  2+ bilateral lower extremity edema-left leg worse than right leg.  Both legs have areas of bruising, but no true erythema.  Clear fluid leaking out of a couple different areas on each leg, but no single lesion/wound or ulcer.     Pulmonary/Chest: Effort normal and breath sounds normal. No respiratory distress. No has no wheezes. No rales. Musculoskeletal: No calf tenderness.  Bilateral legs nontender Skin: Skin is warm and dry. Not diaphoretic.       Assessment & Plan:    See Problem List for Assessment and Plan of chronic medical problems.    This visit occurred during the SARS-CoV-2 public health emergency.  Safety protocols were in place, including screening questions prior to the visit, additional usage of staff PPE, and extensive cleaning of exam room while observing appropriate contact time as  indicated for disinfecting solutions.

## 2020-02-03 ENCOUNTER — Other Ambulatory Visit: Payer: Self-pay | Admitting: Internal Medicine

## 2020-02-03 ENCOUNTER — Other Ambulatory Visit: Payer: Self-pay

## 2020-02-03 ENCOUNTER — Ambulatory Visit (INDEPENDENT_AMBULATORY_CARE_PROVIDER_SITE_OTHER): Payer: Medicare Other | Admitting: Internal Medicine

## 2020-02-03 ENCOUNTER — Encounter: Payer: Self-pay | Admitting: Internal Medicine

## 2020-02-03 VITALS — BP 112/54 | HR 53 | Temp 97.7°F | Ht 66.0 in | Wt 177.0 lb

## 2020-02-03 DIAGNOSIS — R600 Localized edema: Secondary | ICD-10-CM | POA: Diagnosis not present

## 2020-02-03 MED ORDER — FUROSEMIDE 40 MG PO TABS: 40.0000 mg | ORAL_TABLET | Freq: Every day | ORAL | 3 refills | Status: DC

## 2020-02-03 MED ORDER — LOSARTAN POTASSIUM 50 MG PO TABS: 50.0000 mg | ORAL_TABLET | Freq: Every day | ORAL | 3 refills | Status: DC

## 2020-02-03 MED ORDER — POTASSIUM CHLORIDE CRYS ER 20 MEQ PO TBCR: 20.0000 meq | EXTENDED_RELEASE_TABLET | Freq: Every day | ORAL | 3 refills | Status: DC

## 2020-02-03 NOTE — Patient Instructions (Addendum)
  Medications reviewed and updated.  Changes include :     Decrease losartan to 50 mg daily Start furosemide 40 mg daily - take in the morning Take potassium pill daily  Elevate your legs Eat a low sodium diet  Your prescription(s) have been submitted to your pharmacy. Please take as directed and contact our office if you believe you are having problem(s) with the medication(s).     Please followup in next week

## 2020-02-03 NOTE — Assessment & Plan Note (Signed)
Acute on chronic Severe in nature with leakage from bilateral legs-clear fluid No evidence of lower extremity infection No pain in legs, fever, increased shortness of breath or chest pain Has not tolerated compression socks, elevating legs and has decreased salt intake Will start furosemide 40 mg daily along with potassium 20 mEq daily Since his blood pressure is on the low side will decrease losartan to 50 mg daily We will check blood work at his next visit-follow-up next week, but call sooner with any questions or concerns

## 2020-02-08 NOTE — Progress Notes (Signed)
Subjective:    Patient ID: Paul Mays, male    DOB: 18-Jul-1928, 86 y.o.   MRN: [id number]  HPI The patient is here for follow up of his leg edema.   at his last visit we decreased his losartan to 50 mg daily, started lasix 40 mg daily with potassium.  I advised him to elevate his legs and decrease his salt intake.    R - 1 + just above ankle, L - > R 1-2 + mid way up  Medications and allergies reviewed with patient and updated if appropriate.  Patient Active Problem List   Diagnosis Date Noted  . Bilateral leg edema 01/26/2020  . GERD (gastroesophageal reflux disease) 01/26/2020  . Chronic pain of both shoulders 12/10/2018  . Easy bruising 03/20/2018  . Decreased hearing of right ear 09/04/2017  . Rotator cuff tear arthropathy of both shoulders 10/18/2015  . Thrombocytopenia ([place]) 09/24/2015  . Diverticulosis of colon without hemorrhage 07/02/2014  . Hypertrophic cardiomyopathy ([place]) 03/24/2011  . OBSTRUCTIVE SLEEP APNEA 11/08/2010  . HYPERTENSION, PULMONARY 09/16/2010  . HEARING LOSS, BILATERAL 06/18/2009  . Coronary atherosclerosis 03/07/2009  . Aortic valve disorder, mod AR, mod AS 03/07/2009  . CAROTID STENOSIS 03/07/2009  . Hyperlipidemia 11/15/2008  . Peripheral neuropathy, idiopathic 11/15/2008  . Essential hypertension 11/15/2008  . ARTHRALGIA 11/15/2008  . COLITIS, HX OF 11/15/2008  . VENOUS INSUFFICIENCY 04/20/2007  . Gout 02/10/2007  . PSTPRC STATUS, PTCA 02/10/2007    Current Outpatient Medications on File Prior to Visit  Medication Sig Dispense Refill  . allopurinol (ZYLOPRIM) 300 MG tablet TAKE 1 TABLET(300 MG) BY MOUTH DAILY AS NEEDED FOR GOUT FLARE 90 tablet 0  . aspirin EC 81 MG tablet Take 1 tablet (81 mg total) by mouth daily.    . Cholecalciferol (VITAMIN D3) 2000 UNITS capsule Take 1 capsule (2,000 Units total) by mouth daily.    . Flavocoxid (LIMBREL) 500 MG CAPS Take 500 mg by mouth daily.    . furosemide (LASIX) 40 MG tablet Take 1 tablet  (40 mg total) by mouth daily. 30 tablet 3  . gabapentin (NEURONTIN) 600 MG tablet Take 1 tablet (600 mg total) by mouth 2 (two) times daily. 60 tablet 0  . losartan (COZAAR) 50 MG tablet Take 1 tablet (50 mg total) by mouth daily. 90 tablet 3  . metoprolol (LOPRESSOR) 50 MG tablet Take 25 mg by mouth 2 (two) times daily. 1/2 tab by mouth two times a day.     . naproxen sodium (ALEVE) 220 MG tablet Take 220 mg by mouth as needed.     Marland Kitchen omeprazole (PRILOSEC) 20 MG capsule Take two tabs daily for one month, then one tab daily 60 capsule 3  . potassium chloride SA (KLOR-CON) 20 MEQ tablet Take 1 tablet (20 mEq total) by mouth daily. 30 tablet 3  . pravastatin (PRAVACHOL) 40 MG tablet Take 40 mg by mouth daily.      Marland Kitchen terazosin (HYTRIN) 10 MG capsule Take 12 mg by mouth at bedtime.    . traMADol (ULTRAM) 50 MG tablet Take 1 tablet (50 mg total) by mouth every 8 (eight) hours as needed for moderate pain (for chronic shoulder pain). 45 tablet 1   No current facility-administered medications on file prior to visit.    Past Medical History:  Diagnosis Date  . Anxiety   . Aortic stenosis   . Arthralgia   . Arthritis   . CAD (coronary artery disease)   . Carotid stenosis   .  Cervicalgia   . CLUSTER HEADACHE 02/10/2007   Qualifier: Diagnosis of  By: Linna Darner MD, Gwyndolyn Saxon    . Colitis   . Edema   . Gout   . Hearing loss   . Herpes zoster without mention of complication   . Hyperlipidemia   . Hypertension   . Hyperuricemia   . Impacted cerumen   . Migraine   . Migraine   . Murmur   . Olecranon bursitis   . OSA (obstructive sleep apnea)   . Other and unspecified hyperlipidemia   . Other malaise and fatigue   . Peripheral neuropathy   . Polyneuropathy   . Pulmonary hypertension ([place])   . Unspecified hereditary and idiopathic peripheral neuropathy   . Venous insufficiency     Past Surgical History:  Procedure Laterality Date  . ANGIOPLASTY     w/ 2 stents in 1999  . APPENDECTOMY    .  HEMORRHOID SURGERY    . LIPOMA EXCISION    . TONSILLECTOMY      Social History   Socioeconomic History  . Marital status: Married    Spouse name: [name]  . Number of children: 2  . Years of education: college  . Highest education level: Not on file  Occupational History  . 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 Smoking status: Never Smoker  . Smokeless tobacco: Never Used  Substance and Sexual Activity  . Alcohol use: Yes    Alcohol/week: 0.0 standard drinks    Comment: 2 drink before dinner vodka   . Drug use: No  . Sexual activity: Never  Other Topics Concern  . Not on file  Social History Narrative   Pt is married and lives with wife (suzanne). Patient finished  two years of college.    Pt has children.   Caffeine -two cups daily.   Left handed.   Retired Licensed conveyancer                    Social Determinants of Radio broadcast assistant Strain:   . Difficulty of Paying Living Expenses:   Food Insecurity:   . Worried About Charity fundraiser in the Last Year:   . Arboriculturist in the Last Year:   Transportation Needs:   . Film/video editor (Medical):   Marland Kitchen Lack of Transportation (Non-Medical):   Physical Activity:   . Days of Exercise per Week:   . Minutes of Exercise per Session:   Stress:   . Feeling of Stress :   Social Connections:   . Frequency of Communication with Friends and Family:   . Frequency of Social Gatherings with Friends and Family:   . Attends Religious Services:   . Active Member of Clubs or Organizations:   . Attends Archivist Meetings:   Marland Kitchen Marital Status:     Family History  Problem Relation Age of Onset  . Stroke Father        Died, 83  . High blood pressure Father   . Diabetes Father   . Other Mother        Died, 88  . Diabetes Brother        Died, 82  . Diabetes Sister        Living, 82  . Healthy Daughter   . Healthy Son      Review of Systems  Constitutional: Negative for  chills and fever.  Respiratory: Negative for shortness of breath.   Cardiovascular: Positive for palpitations (occ) and leg swelling (imrpoved). Negative for chest pain.       Objective:   Vitals:   02/09/20 1104  BP: 140/64  Pulse: 62  Resp: 16  Temp: 98.6 F (37 C)  SpO2: 98%   BP Readings from Last 3 Encounters:  02/09/20 140/64  02/03/20 (!) 112/54  01/30/20 (!) 104/50   Wt Readings from Last 3 Encounters:  02/09/20 166 lb (75.3 kg)  02/03/20 177 lb (80.3 kg)  01/30/20 176 lb (79.8 kg)   Body mass index is 26.79 kg/m.   Physical Exam    Constitutional: Appears well-developed and well-nourished. No distress.  HENT:  Head: Normocephalic and atraumatic.  Neck: Neck supple. No tracheal deviation present. No thyromegaly present.  No cervical lymphadenopathy Cardiovascular: Normal rate, regular rhythm and normal heart sounds.   3/6 systolic murmur heard. No carotid bruit .  Right lower extremity with 1+ pitting edema foot extending just proximal to ankle, left lower extremity has more edema than right lower extremity-1-2+ pitting edema midway up lower extremity. Pulmonary/Chest: Effort normal and breath sounds normal. No respiratory distress. No has no wheezes. No rales.  Skin: Skin is warm and dry. Not diaphoretic.  Couple of openings in skin in the mid left lower extremity where he states he still gets some leaking of clear fluid.  No current leaking, no bleeding no significant surrounding erythema to suggest cellulitis Psychiatric: Normal mood and affect. Behavior is normal.      Assessment & Plan:    See Problem List for Assessment and Plan of chronic medical problems.    This visit occurred during the SARS-CoV-2 public health emergency.  Safety protocols were in place, including screening questions prior to the visit, additional usage of staff PPE, and extensive cleaning of exam room while observing  appropriate contact time as indicated for disinfecting solutions.

## 2020-02-08 NOTE — Patient Instructions (Addendum)
  Blood work was ordered.     Medications reviewed and updated.  Changes include :   none  Your prescription(s) have been submitted to your pharmacy. Please take as directed and contact our office if you believe you are having problem(s) with the medication(s).  Schedule a follow up visit with Dr Burt Knack.    Please followup in 2 months

## 2020-02-09 ENCOUNTER — Other Ambulatory Visit: Payer: Self-pay

## 2020-02-09 ENCOUNTER — Ambulatory Visit (INDEPENDENT_AMBULATORY_CARE_PROVIDER_SITE_OTHER): Payer: Medicare Other | Admitting: Internal Medicine

## 2020-02-09 ENCOUNTER — Encounter: Payer: Self-pay | Admitting: Internal Medicine

## 2020-02-09 VITALS — BP 140/64 | HR 62 | Temp 98.6°F | Resp 16 | Ht 66.0 in | Wt 166.0 lb

## 2020-02-09 DIAGNOSIS — R600 Localized edema: Secondary | ICD-10-CM

## 2020-02-09 DIAGNOSIS — I10 Essential (primary) hypertension: Secondary | ICD-10-CM

## 2020-02-09 LAB — BASIC METABOLIC PANEL
BUN: 26 mg/dL — ABNORMAL HIGH (ref 6–23)
CO2: 30 mEq/L (ref 19–32)
Calcium: 8.2 mg/dL — ABNORMAL LOW (ref 8.4–10.5)
Chloride: 102 mEq/L (ref 96–112)
Creatinine, Ser: 1.56 mg/dL — ABNORMAL HIGH (ref 0.40–1.50)
GFR: 42 mL/min — ABNORMAL LOW (ref >60.00)
Glucose, Bld: 94 mg/dL (ref 70–99)
Potassium: 4.9 mEq/L (ref 3.5–5.1)
Sodium: 138 mEq/L (ref 135–145)

## 2020-02-09 MED ORDER — TRAMADOL HCL 50 MG PO TABS: 50.0000 mg | ORAL_TABLET | Freq: Two times a day (BID) | ORAL | 1 refills | Status: DC | PRN

## 2020-02-09 NOTE — Assessment & Plan Note (Signed)
Acute on chronic Leg swelling has been worsening recently, but he is always had some degree of swelling Swelling is better after taking Lasix 40 mg daily for 1 week BMP today-May need to adjust dose depending on kidney function He has been elevating her leg his legs when he is sitting Again stressed low-sodium diet, which he is likely not as compliant as he should Encouraged walking around the house, which will also help the edema No concerning increased shortness of breath, chest pain or palpitations to suggest cardiac cause, but did advise that he follow-up with Dr. Burt Knack since he is due for a follow-up and to make sure this is not cardiac in nature

## 2020-02-10 NOTE — Addendum Note (Signed)
Addended by: Binnie Rail on: 02/10/2020 07:22 AM   Modules accepted: Orders

## 2020-02-20 ENCOUNTER — Encounter: Payer: Self-pay | Admitting: Internal Medicine

## 2020-02-20 ENCOUNTER — Ambulatory Visit (INDEPENDENT_AMBULATORY_CARE_PROVIDER_SITE_OTHER): Payer: Medicare Other | Admitting: Internal Medicine

## 2020-02-20 VITALS — BP 112/60 | HR 64 | Temp 97.9°F | Ht 66.0 in | Wt 169.8 lb

## 2020-02-20 DIAGNOSIS — R1310 Dysphagia, unspecified: Secondary | ICD-10-CM

## 2020-02-20 NOTE — Patient Instructions (Signed)
If you are age 84 or older, your body mass index should be between 23-30. Your Body mass index is 27.41 kg/m. If this is out of the aforementioned range listed, please consider follow up with your Primary Care Provider.  If you are age 53 or younger, your body mass index should be between 19-25. Your Body mass index is 27.41 kg/m. If this is out of the aformentioned range listed, please consider follow up with your Primary Care Provider.   You have been scheduled for an endoscopy. Please follow written instructions given to you at your visit today. If you use inhalers (even only as needed), please bring them with you on the day of your procedure.  It was a pleasure to see you today!  Dr. Scarlette Shorts

## 2020-02-20 NOTE — Progress Notes (Signed)
HISTORY OF PRESENT ILLNESS:  Paul Mays is a 86 y.o. male with past medical history as listed below.  He is sent today regarding a new complaint of dysphagia.  He reports a 1 year history of intermittent solid food dysphagia approximately once per week.  This is stable.  His weight is stable.  He was empirically placed on omeprazole 20 mg twice daily last month by his PCP.  He did not have reflux symptoms prior to initiating therapy.  He has not noticed that this has helped.  He did undergo upper endoscopy remotely in 2002 to evaluate weight loss.  This was normal.  Blood work from Feb 09, 2020 shows creatinine 1.56.  REVIEW OF SYSTEMS:  All non-GI ROS negative unless otherwise stated in the HPI except for ankle edema  Past Medical History:  Diagnosis Date  . Anxiety   . Aortic stenosis   . Arthralgia   . Arthritis   . CAD (coronary artery disease)   . Carotid stenosis   . Cervicalgia   . CLUSTER HEADACHE 02/10/2007   Qualifier: Diagnosis of  By: Linna Darner MD, Gwyndolyn Saxon    . Colitis   . Edema   . Gout   . Hearing loss   . Herpes zoster without mention of complication   . Hyperlipidemia   . Hypertension   . Hyperuricemia   . Impacted cerumen   . Migraine   . Migraine   . Murmur   . Olecranon bursitis   . OSA (obstructive sleep apnea)   . Other and unspecified hyperlipidemia   . Other malaise and fatigue   . Peripheral neuropathy   . Polyneuropathy   . Pulmonary hypertension ([place])   . Unspecified hereditary and idiopathic peripheral neuropathy   . Venous insufficiency     Past Surgical History:  Procedure Laterality Date  . ANGIOPLASTY     w/ 2 stents in 1999  . APPENDECTOMY    . HEMORRHOID SURGERY    . LIPOMA EXCISION    . TONSILLECTOMY      Social History Paul Mays  reports that he has never smoked. He has never used smokeless tobacco. He reports current alcohol use. He reports that he does not use drugs.  family history includes Diabetes in his brother,  father, and sister; Healthy in his daughter and son; High blood pressure in his father; Other in his mother; Stroke in his father.  Allergies  Allergen Reactions  . Lisinopril Other (See Comments)    Hyperkalemia in the context of combined spironolactone and lisinopril therapy  . Spironolactone Other (See Comments)    Hyperkalemia in context of spironolactone and lisinopril therapy       PHYSICAL EXAMINATION: Vital signs: BP 112/60   Pulse 64   Temp 97.9 F (36.6 C)   Ht 5\' 6"  (1.676 m)   Wt 169 lb 12.8 oz (77 kg)   BMI 27.41 kg/m   Constitutional: generally well-appearing, no acute distress Psychiatric: alert and oriented x3, cooperative Eyes: extraocular movements intact, anicteric, conjunctiva pink Mouth: oral pharynx moist, no lesions Neck: supple no lymphadenopathy Cardiovascular: heart regular rate and rhythm, systolic murmur present Lungs: clear to auscultation bilaterally Abdomen: soft, nontender, nondistended, no obvious ascites, no peritoneal signs, normal bowel sounds, no organomegaly Rectal: Omitted Extremities: no clubbing or cyanosis.  2+ lower extremity edema bilaterally with stasis changes Skin: no lesions on visible extremities Neuro: No focal deficits.  Cranial nerves intact  ASSESSMENT:  1.  1 year history of intermittent  solid food dysphagia.  Rule out stricture or ring.   PLAN:  1.  Continue PPI for now 2.  Schedule upper endoscopy with probable esophageal dilation.  The patient is higher than baseline risk due to his age and comorbidities.The nature of the procedure, as well as the risks, benefits, and alternatives were carefully and thoroughly reviewed with the patient. Ample time for discussion and questions allowed. The patient understood, was satisfied, and agreed to proceed. 3.  If no significant pathology on upper endoscopy and.  Dilation unhelpful, then consider barium esophagram.

## 2020-02-28 ENCOUNTER — Other Ambulatory Visit (INDEPENDENT_AMBULATORY_CARE_PROVIDER_SITE_OTHER): Payer: Medicare Other

## 2020-02-28 DIAGNOSIS — R600 Localized edema: Secondary | ICD-10-CM | POA: Diagnosis not present

## 2020-02-28 LAB — BASIC METABOLIC PANEL
BUN: 33 mg/dL — ABNORMAL HIGH (ref 6–23)
CO2: 28 mEq/L (ref 19–32)
Calcium: 8.8 mg/dL (ref 8.4–10.5)
Chloride: 107 mEq/L (ref 96–112)
Creatinine, Ser: 1.42 mg/dL (ref 0.40–1.50)
GFR: 46.81 mL/min — ABNORMAL LOW (ref >60.00)
Glucose, Bld: 94 mg/dL (ref 70–99)
Potassium: 4.6 mEq/L (ref 3.5–5.1)
Sodium: 141 mEq/L (ref 135–145)

## 2020-02-29 ENCOUNTER — Other Ambulatory Visit: Payer: Medicare Other

## 2020-03-06 ENCOUNTER — Telehealth: Payer: Self-pay

## 2020-03-06 ENCOUNTER — Other Ambulatory Visit: Payer: Self-pay

## 2020-03-06 DIAGNOSIS — R1310 Dysphagia, unspecified: Secondary | ICD-10-CM

## 2020-03-06 NOTE — Telephone Encounter (Signed)
-----   Message from Irene Shipper, MD sent at 03/05/2020 10:55 AM EDT ----- Regarding: RE: [place] pt Linda,  Anesthesia requests cardiac clearance before proceeding with his EGD in the [place]. He sees Dr. Burt Knack (had cardiology OV 08-2019 and ECHO 09-2019). Please do the following:  1. Contact the patient with this information and let him know that we can reschedule his procedure at some point after cardiac clearance. 2. Cancel the June 3 [place] appointment. 3. Help arrange expedient cardiology evaluation, as his dysphagia needs evaluated in a timely manner. 4. Let me know the result of his cardiology evaluation, when available 5. Convert this conversation to a phone note, for future reference.   Thanks!  JP ----- Message ----- From: Osvaldo Angst, CRNA Sent: 03/01/2020   7:46 AM EDT To: Irene Shipper, MD Subject: [name]                                         Dr. Henrene Pastor,  This pt is scheduled for an EGD with you on June 3.  Could we please request a cardiac clearance for this procedure?  Thanks,  Osvaldo Angst

## 2020-03-06 NOTE — Telephone Encounter (Signed)
-----   Message from Irene Shipper, MD sent at 03/05/2020 10:55 AM EDT ----- Regarding: RE: [place] pt Paul Mays,  Anesthesia requests cardiac clearance before proceeding with his EGD in the [place]. He sees Dr. Burt Knack (had cardiology OV 08-2019 and ECHO 09-2019). Please do the following:  1. Contact the patient with this information and let him know that we can reschedule his procedure at some point after cardiac clearance. 2. Cancel the June 3 [place] appointment. 3. Help arrange expedient cardiology evaluation, as his dysphagia needs evaluated in a timely manner. 4. Let me know the result of his cardiology evaluation, when available 5. Convert this conversation to a phone note, for future reference.   Thanks!  JP ----- Message ----- From: Osvaldo Angst, CRNA Sent: 03/01/2020   7:46 AM EDT To: Irene Shipper, MD Subject: [name]                                         Dr. Henrene Pastor,  This pt is scheduled for an EGD with you on June 3.  Could we please request a cardiac clearance for this procedure?  Thanks,  Osvaldo Angst

## 2020-03-06 NOTE — Telephone Encounter (Signed)
Since is going to be so long until cardiac clearance, please order a barium swallow with tablet, "dysphagia evaluate".  This will give [name] a roadmap of his esophagus and likely rule out anything serious.

## 2020-03-06 NOTE — Telephone Encounter (Signed)
Barium swallow study with tablet scheduled at [facility] on 03/09/2020 at 11 am with 10:45 am arrival time. Spoke with patient regarding barium swallow study, pt advised to be NPO 3 hours prior to study, pt aware of scheduled appointment. Pt advised to call office with any questions or concerns.

## 2020-03-06 NOTE — Telephone Encounter (Signed)
Spoke with patient regarding the necessity of cardiac clearance prior to procedure. Patient aware that appointment for 03/08/20 has been cancelled until after cardiac clearance. Dr. Copper is completely booked past August, scheduled cardiology appointment with Dr. Antionette Char assistant,  [name], NP. Her first available appointment was 05/28/20 at 1:45 pm, I requested that pt be placed on cancellation list for sooner appointment if possible. Patient aware of cardiology appointment, pt advised to call office with any other questions or concerns.

## 2020-03-08 ENCOUNTER — Encounter: Payer: Medicare Other | Admitting: Internal Medicine

## 2020-03-09 ENCOUNTER — Other Ambulatory Visit: Payer: Self-pay

## 2020-03-09 ENCOUNTER — Ambulatory Visit (HOSPITAL_COMMUNITY)
Admission: RE | Admit: 2020-03-09 | Discharge: 2020-03-09 | Disposition: A | Discharge status: Home / Self Care | Payer: Medicare Other | Source: Ambulatory Visit | Attending: Internal Medicine | Admitting: Internal Medicine

## 2020-03-09 DIAGNOSIS — R1310 Dysphagia, unspecified: Secondary | ICD-10-CM | POA: Diagnosis not present

## 2020-03-12 NOTE — Telephone Encounter (Signed)
Spoke with patient regarding results, pt advised to call with any other questions or concerns. Pt states that he will call us if he has any more problems with swallowing.

## 2020-04-08 NOTE — Progress Notes (Signed)
Subjective:    Patient ID: Paul Mays, male    DOB: [birth year], 86 y.o.   MRN: [id number]  HPI The patient is here for follow up of their chronic medical problems, including htn, leg edema  He stopped the the lasix because it was making his urinate too much.  He also stopped the potassium.   When he wakes up in the morning his legs are good.  His legs swell up during the day. He does not use compression socks-it is too difficult to get them on and off.  He elevates the legs when sitting.  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  He knows this has sodium, but does not have a lot of choice.  He never added salt.  [facility]  They did blood work and he has anemia, which is new.  He also had an EKG that was normal.  Tomorrow he goes back and will have follow-up blood work and an echocardiogram.    Medications and allergies reviewed with patient and updated if appropriate.  Patient Active Problem List   Diagnosis Date Noted  . Bilateral leg edema 01/26/2020  . GERD (gastroesophageal reflux disease) 01/26/2020  . Chronic pain of both shoulders 12/10/2018  . Easy bruising 03/20/2018  . Decreased hearing of right ear 09/04/2017  . Rotator cuff tear arthropathy of both shoulders 10/18/2015  . Thrombocytopenia ([place]) 09/24/2015  . Diverticulosis of colon without hemorrhage 07/02/2014  . Hypertrophic cardiomyopathy ([place]) 03/24/2011  . OBSTRUCTIVE SLEEP APNEA 11/08/2010  . HYPERTENSION, PULMONARY 09/16/2010  . HEARING LOSS, BILATERAL 06/18/2009  . Coronary atherosclerosis 03/07/2009  . Aortic valve disorder, mod AR, mod AS 03/07/2009  . CAROTID STENOSIS 03/07/2009  . Hyperlipidemia 11/15/2008  . Peripheral neuropathy, idiopathic 11/15/2008  . Essential hypertension 11/15/2008  . ARTHRALGIA 11/15/2008  . COLITIS, HX OF 11/15/2008  . VENOUS INSUFFICIENCY 04/20/2007  . Gout 02/10/2007  . PSTPRC STATUS, PTCA 02/10/2007     Current Outpatient Medications on File Prior to Visit  Medication Sig Dispense Refill  . allopurinol (ZYLOPRIM) 300 MG tablet TAKE 1 TABLET(300 MG) BY MOUTH DAILY AS NEEDED FOR GOUT FLARE 90 tablet 0  . aspirin EC 81 MG tablet Take 1 tablet (81 mg total) by mouth daily.    . Cholecalciferol (VITAMIN D3) 2000 UNITS capsule Take 1 capsule (2,000 Units total) by mouth daily.    . Flavocoxid (LIMBREL) 500 MG CAPS Take 500 mg by mouth daily.    Marland Kitchen gabapentin (NEURONTIN) 600 MG tablet Take 1 tablet (600 mg total) by mouth 2 (two) times daily. 60 tablet 0  . losartan (COZAAR) 50 MG tablet Take 1 tablet (50 mg total) by mouth daily. 90 tablet 3  . metoprolol (LOPRESSOR) 50 MG tablet Take 25 mg by mouth 2 (two) times daily. 1/2 tab by mouth two times a day.     Marland Kitchen omeprazole (PRILOSEC) 20 MG capsule Take two tabs daily for one month, then one tab daily 60 capsule 3  . pravastatin (PRAVACHOL) 40 MG tablet Take 40 mg by mouth daily.      Marland Kitchen terazosin (HYTRIN) 10 MG capsule Take 12 mg by mouth at bedtime.    . traMADol (ULTRAM) 50 MG tablet Take 1 tablet (50 mg total) by mouth every 12 (twelve) hours as needed for moderate pain (for chronic shoulder pain). 60 tablet 1   No current facility-administered medications on file prior to  visit.    Past Medical History:  Diagnosis Date  . Anxiety   . Aortic stenosis   . Arthralgia   . Arthritis   . CAD (coronary artery disease)   . Carotid stenosis   . Cervicalgia   . CLUSTER HEADACHE 02/10/2007   Qualifier: Diagnosis of  By: Linna Darner MD, Gwyndolyn Saxon    . Colitis   . Edema   . Gout   . Hearing loss   . Herpes zoster without mention of complication   . Hyperlipidemia   . Hypertension   . Hyperuricemia   . Impacted cerumen   . Migraine   . Migraine   . Murmur   . Olecranon bursitis   . OSA (obstructive sleep apnea)   . Other and unspecified hyperlipidemia   . Other malaise and fatigue   . Peripheral neuropathy   . Polyneuropathy   . Pulmonary  hypertension ([place])   . Unspecified hereditary and idiopathic peripheral neuropathy   . Venous insufficiency     Past Surgical History:  Procedure Laterality Date  . ANGIOPLASTY     w/ 2 stents in 1999  . APPENDECTOMY    . HEMORRHOID SURGERY    . LIPOMA EXCISION    . TONSILLECTOMY      Social History   Socioeconomic History  . Marital status: Married    Spouse name: [name]  . Number of children: 2  . Years of education: college  . Highest education level: Not on file  Occupational History  . 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 Smoking status: Never Smoker  . Smokeless tobacco: Never Used  Vaping Use  . Vaping Use: Never used  Substance and Sexual Activity  . Alcohol use: Yes    Alcohol/week: 0.0 standard drinks    Comment: 2 drink before dinner vodka   . Drug use: No  . Sexual activity: Never  Other Topics Concern  . Not on file  Social History Narrative   Pt is married and lives with wife (suzanne). Patient finished  two years of college.    Pt has children.   Caffeine -two cups daily.   Left handed.   Retired Licensed conveyancer                    Social Determinants of Radio broadcast assistant Strain:   . Difficulty of Paying Living Expenses:   Food Insecurity:   . Worried About Charity fundraiser in the Last Year:   . Arboriculturist in the Last Year:   Transportation Needs:   . Film/video editor (Medical):   Marland Kitchen Lack of Transportation (Non-Medical):   Physical Activity:   . Days of Exercise per Week:   . Minutes of Exercise per Session:   Stress:   . Feeling of Stress :   Social Connections:   . Frequency of Communication with Friends and Family:   . Frequency of Social Gatherings with Friends and Family:   . Attends Religious Services:   . Active Member of Clubs or Organizations:   . Attends Archivist Meetings:   Marland Kitchen Marital Status:     Family History    Problem Relation Age of Onset  . Stroke Father        Died, 83  . High blood pressure Father   . Diabetes Father   . Other Mother  Died, 88  . Diabetes Brother        Died, 82  . Diabetes Sister        Living, 75  . Healthy Daughter   . Healthy Son     Review of Systems  HENT: Positive for trouble swallowing (food gets stuck ).   Respiratory: Negative for shortness of breath.   Cardiovascular: Negative for chest pain and palpitations.  Gastrointestinal: Negative for abdominal pain and nausea.       No gerd       Objective:   Vitals:   04/10/20 1107  BP: (!) 172/84  Pulse: 60  Temp: 98 F (36.7 C)  SpO2: 94%   BP Readings from Last 3 Encounters:  04/10/20 (!) 172/84  02/20/20 112/60  02/09/20 140/64   Wt Readings from Last 3 Encounters:  04/10/20 170 lb (77.1 kg)  02/20/20 169 lb 12.8 oz (77 kg)  02/09/20 166 lb (75.3 kg)   Body mass index is 27.44 kg/m.   Physical Exam    Constitutional: Appears well-developed and well-nourished. No distress.  HENT:  Head: Normocephalic and atraumatic.  Neck: Neck supple. No tracheal deviation present. No thyromegaly present.  No cervical lymphadenopathy Cardiovascular: Normal rate, regular rhythm and normal heart sounds.   2/6 systolic murmur heard. No carotid bruit .  1+ pitting bilateral lower extremity edema Pulmonary/Chest: Effort normal and breath sounds normal. No respiratory distress. No has no wheezes. No rales.  Skin: Skin is warm and dry. Not diaphoretic.  Psychiatric: Normal mood and affect. Behavior is normal.      Assessment & Plan:    See Problem List for Assessment and Plan of chronic medical problems.    This visit occurred during the SARS-CoV-2 public health emergency.  Safety protocols were in place, including screening questions prior to the visit, additional usage of staff PPE, and extensive cleaning of exam room while observing appropriate contact time as indicated for disinfecting  solutions.

## 2020-04-10 ENCOUNTER — Other Ambulatory Visit: Payer: Self-pay

## 2020-04-10 ENCOUNTER — Encounter: Payer: Self-pay | Admitting: Internal Medicine

## 2020-04-10 ENCOUNTER — Ambulatory Visit (INDEPENDENT_AMBULATORY_CARE_PROVIDER_SITE_OTHER): Payer: Medicare Other | Admitting: Internal Medicine

## 2020-04-10 VITALS — BP 172/84 | HR 60 | Temp 98.0°F | Ht 66.0 in | Wt 170.0 lb

## 2020-04-10 DIAGNOSIS — K219 Gastro-esophageal reflux disease without esophagitis: Secondary | ICD-10-CM

## 2020-04-10 DIAGNOSIS — R600 Localized edema: Secondary | ICD-10-CM | POA: Diagnosis not present

## 2020-04-10 DIAGNOSIS — G8929 Other chronic pain: Secondary | ICD-10-CM

## 2020-04-10 DIAGNOSIS — I10 Essential (primary) hypertension: Secondary | ICD-10-CM | POA: Diagnosis not present

## 2020-04-10 DIAGNOSIS — M25512 Pain in left shoulder: Secondary | ICD-10-CM

## 2020-04-10 DIAGNOSIS — M25511 Pain in right shoulder: Secondary | ICD-10-CM | POA: Diagnosis not present

## 2020-04-10 NOTE — Assessment & Plan Note (Addendum)
BP Readings from Last 3 Encounters:  04/10/20 (!) 172/84  02/20/20 112/60  02/09/20 140/64   Blood pressure elevated here today, but typically well controlled - will just monitor for now -- if continues to be elevated will increase losartan to 100 mg daily, which he was on previously Continue [facility]

## 2020-04-10 NOTE — Assessment & Plan Note (Signed)
Chronic GERD controlled Continue daily medication  Still has some dysphagia and will likely have an EGD soon. On prednisone 5 mg daily and occ takes aleve for b/l severe shoulder OA

## 2020-04-10 NOTE — Patient Instructions (Signed)
  Medications reviewed and updated.  Changes include :   none    Please followup in 6 months   

## 2020-04-10 NOTE — Assessment & Plan Note (Signed)
Chronic Severe OA Seeing ortho On prednisone 5 mg daily occ takes aleve Taking tramadol as needed Taking tylenol as needed

## 2020-04-10 NOTE — Assessment & Plan Note (Signed)
Chronic Stopped lasix due to increased urinating Still with 1 + pitting edema, but it does not bother him Continue elevating legs Try to limit salt as much as possible Has difficulty doing compression socks Will monitor for now since it does not bother him [facility]

## 2020-04-30 DIAGNOSIS — M19012 Primary osteoarthritis, left shoulder: Secondary | ICD-10-CM | POA: Diagnosis not present

## 2020-04-30 DIAGNOSIS — M19011 Primary osteoarthritis, right shoulder: Secondary | ICD-10-CM | POA: Diagnosis not present

## 2020-05-03 NOTE — Progress Notes (Signed)
Subjective:    Patient ID: Paul Mays, male    DOB: 1927-10-07, 86 y.o.   MRN: [id number]  HPI The patient is here for an acute visit.   He has chronic b/l LE edema - it is not changed.  His right leg has been weeping clear fluid and he was concerned about that.  He denies fever, redness in his legs or leg pain.    He tries to watch the sodium intake, but is not compliant with a low sodium diet.  He does not always elevate his legs.     He was on lasix but stopped it due to increased urination.      Medications and allergies reviewed with patient and updated if appropriate.  Patient Active Problem List   Diagnosis Date Noted  . Bilateral leg edema 01/26/2020  . GERD (gastroesophageal reflux disease) 01/26/2020  . Chronic pain of both shoulders 12/10/2018  . Easy bruising 03/20/2018  . Decreased hearing of right ear 09/04/2017  . Rotator cuff tear arthropathy of both shoulders 10/18/2015  . Thrombocytopenia ([place]) 09/24/2015  . Diverticulosis of colon without hemorrhage 07/02/2014  . Hypertrophic cardiomyopathy ([place]) 03/24/2011  . OBSTRUCTIVE SLEEP APNEA 11/08/2010  . HYPERTENSION, PULMONARY 09/16/2010  . HEARING LOSS, BILATERAL 06/18/2009  . Coronary atherosclerosis 03/07/2009  . Aortic valve disorder, mod AR, mod AS 03/07/2009  . CAROTID STENOSIS 03/07/2009  . Hyperlipidemia 11/15/2008  . Peripheral neuropathy, idiopathic 11/15/2008  . Essential hypertension 11/15/2008  . ARTHRALGIA 11/15/2008  . COLITIS, HX OF 11/15/2008  . VENOUS INSUFFICIENCY 04/20/2007  . Gout 02/10/2007  . PSTPRC STATUS, PTCA 02/10/2007    Current Outpatient Medications on File Prior to Visit  Medication Sig Dispense Refill  . allopurinol (ZYLOPRIM) 300 MG tablet TAKE 1 TABLET(300 MG) BY MOUTH DAILY AS NEEDED FOR GOUT FLARE 90 tablet 0  . aspirin EC 81 MG tablet Take 1 tablet (81 mg total) by mouth daily.    . Cholecalciferol (VITAMIN D3) 2000 UNITS capsule Take 1 capsule (2,000 Units  total) by mouth daily.    . Flavocoxid (LIMBREL) 500 MG CAPS Take 500 mg by mouth daily.    Marland Kitchen gabapentin (NEURONTIN) 600 MG tablet Take 1 tablet (600 mg total) by mouth 2 (two) times daily. 60 tablet 0  . losartan (COZAAR) 50 MG tablet Take 1 tablet (50 mg total) by mouth daily. 90 tablet 3  . metoprolol (LOPRESSOR) 50 MG tablet Take 25 mg by mouth 2 (two) times daily. 1/2 tab by mouth two times a day.     Marland Kitchen omeprazole (PRILOSEC) 20 MG capsule Take two tabs daily for one month, then one tab daily 60 capsule 3  . pravastatin (PRAVACHOL) 40 MG tablet Take 40 mg by mouth daily.      Marland Kitchen terazosin (HYTRIN) 10 MG capsule Take 12 mg by mouth at bedtime.    . traMADol (ULTRAM) 50 MG tablet Take 1 tablet (50 mg total) by mouth every 12 (twelve) hours as needed for moderate pain (for chronic shoulder pain). 60 tablet 1   No current facility-administered medications on file prior to visit.    Past Medical History:  Diagnosis Date  . Anxiety   . Aortic stenosis   . Arthralgia   . Arthritis   . CAD (coronary artery disease)   . Carotid stenosis   . Cervicalgia   . CLUSTER HEADACHE 02/10/2007   Qualifier: Diagnosis of  By: Linna Darner MD, Gwyndolyn Saxon    . Colitis   . Edema   .  Gout   . Hearing loss   . Herpes zoster without mention of complication   . Hyperlipidemia   . Hypertension   . Hyperuricemia   . Impacted cerumen   . Migraine   . Migraine   . Murmur   . Olecranon bursitis   . OSA (obstructive sleep apnea)   . Other and unspecified hyperlipidemia   . Other malaise and fatigue   . Peripheral neuropathy   . Polyneuropathy   . Pulmonary hypertension ([place])   . Unspecified hereditary and idiopathic peripheral neuropathy   . Venous insufficiency     Past Surgical History:  Procedure Laterality Date  . ANGIOPLASTY     w/ 2 stents in 1999  . APPENDECTOMY    . HEMORRHOID SURGERY    . LIPOMA EXCISION    . TONSILLECTOMY      Social History   Socioeconomic History  . Marital status:  Married    Spouse name: [name]  . Number of children: 2  . Years of education: college  . Highest education level: Not on file  Occupational History  . 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 Smoking status: Never Smoker  . Smokeless tobacco: Never Used  Vaping Use  . Vaping Use: Never used  Substance and Sexual Activity  . Alcohol use: Yes    Alcohol/week: 0.0 standard drinks    Comment: 2 drink before dinner vodka   . Drug use: No  . Sexual activity: Never  Other Topics Concern  . Not on file  Social History Narrative   Pt is married and lives with wife (suzanne). Patient finished  two years of college.    Pt has children.   Caffeine -two cups daily.   Left handed.   Retired Licensed conveyancer                    Social Determinants of Radio broadcast assistant Strain:   . Difficulty of Paying Living Expenses:   Food Insecurity:   . Worried About Charity fundraiser in the Last Year:   . Arboriculturist in the Last Year:   Transportation Needs:   . Film/video editor (Medical):   Marland Kitchen Lack of Transportation (Non-Medical):   Physical Activity:   . Days of Exercise per Week:   . Minutes of Exercise per Session:   Stress:   . Feeling of Stress :   Social Connections:   . Frequency of Communication with Friends and Family:   . Frequency of Social Gatherings with Friends and Family:   . Attends Religious Services:   . Active Member of Clubs or Organizations:   . Attends Archivist Meetings:   Marland Kitchen Marital Status:     Family History  Problem Relation Age of Onset  . Stroke Father        Died, 83  . High blood pressure Father   . Diabetes Father   . Other Mother        Died, 88  . Diabetes Brother        Died, 82  . Diabetes Sister        Living, 64  . Healthy Daughter   . Healthy Son     Review of Systems  Constitutional: Negative for fever.  Respiratory: Negative for shortness  of breath.   Cardiovascular: Positive for leg swelling. Negative for chest pain and  palpitations.  Skin: Positive for wound. Negative for color change.       Objective:   Vitals:   05/04/20 1113  BP: (!) 158/68  Pulse: 77  Temp: 98.3 F (36.8 C)  SpO2: 93%   BP Readings from Last 3 Encounters:  05/04/20 (!) 158/68  04/10/20 (!) 172/84  02/20/20 112/60   Wt Readings from Last 3 Encounters:  05/04/20 174 lb (78.9 kg)  04/10/20 170 lb (77.1 kg)  02/20/20 169 lb 12.8 oz (77 kg)   Body mass index is 28.08 kg/m.   Physical Exam    Constitutional: Appears well-developed and well-nourished. No distress.  Head: Normocephalic and atraumatic.  Cardiovascular: Normal rate, regular rhythm and normal heart sounds.  3/6 sys murmur heard. No carotid bruit .  2 + pitting edema, two breaks in skin in RLE - one proximally that is bleeding when band-aid is taken off, which is still very small skin tear without active leakage Pulmonary/Chest: Effort normal and breath sounds normal. No respiratory distress. No has no wheezes. No rales.  Skin: Skin is warm and dry. Not diaphoretic.  Psychiatric: Normal mood and affect. Behavior is normal.       Assessment & Plan:    See Problem List for Assessment and Plan of chronic medical problems.    This visit occurred during the SARS-CoV-2 public health emergency.  Safety protocols were in place, including screening questions prior to the visit, additional usage of staff PPE, and extensive cleaning of exam room while observing appropriate contact time as indicated for disinfecting solutions.

## 2020-05-04 ENCOUNTER — Other Ambulatory Visit: Payer: Self-pay

## 2020-05-04 ENCOUNTER — Encounter: Payer: Self-pay | Admitting: Internal Medicine

## 2020-05-04 ENCOUNTER — Ambulatory Visit (INDEPENDENT_AMBULATORY_CARE_PROVIDER_SITE_OTHER): Payer: Medicare Other | Admitting: Internal Medicine

## 2020-05-04 VITALS — BP 158/68 | HR 77 | Temp 98.3°F | Ht 66.0 in | Wt 174.0 lb

## 2020-05-04 DIAGNOSIS — R600 Localized edema: Secondary | ICD-10-CM | POA: Diagnosis not present

## 2020-05-04 MED ORDER — FUROSEMIDE 20 MG PO TABS
20.0000 mg | ORAL_TABLET | Freq: Every day | ORAL | 1 refills | Status: DC
Start: 2020-05-04 — End: 2021-10-16

## 2020-05-04 NOTE — Patient Instructions (Addendum)
°  Medications reviewed and updated.  Changes include :   Start lasix 20 mg daily - take in the morning.    Your prescription(s) have been submitted to your pharmacy. Please take as directed and contact our office if you believe you are having problem(s) with the medication(s).   Make sure you are elevating your legs whenever you sit.     Please followup in 6 months

## 2020-05-04 NOTE — Assessment & Plan Note (Signed)
Chronic Edema slightly worse now and he does have a couple of skin breaks where he has clear fluid leaking out Discussed the importance of improving the edema in order to stop the leaking. No evidence of infection Stressed low-sodium diet and elevating his legs whenever possible Wearing the compression socks is very difficult and he has very thin skin and most likely he will not do this consistently He did not do well with Lasix 40 mg daily because he caused him to urinate too much-we will start Lasix 20 mg daily.  Hopefully this will be tolerable He is cardiology appointment next month, which will be good follow-up Advised him to return or call if any questions or concerns

## 2020-05-07 ENCOUNTER — Other Ambulatory Visit: Payer: Self-pay | Admitting: Internal Medicine

## 2020-05-21 NOTE — Progress Notes (Deleted)
CARDIOLOGY OFFICE NOTE  Date:  05/21/2020    Paul Mays Date of Birth: Jan 29, 1928 Medical Record #[id number]  PCP:  Binnie Rail, MD  Cardiologist:  Tim Lair chief complaint on file.   History of Present Illness: Paul Mays is a 86 y.o. male who presents today for a follow up visit. Seen for Dr. Burt Knack.   He has a history of known CAD s/p remote PCI to the RCA and LCX in 1999 Olevia Perches), mild LVH with septal hypertrophy/LV outflow obstruction noted on Echo in 2016, HTN, HLD,  OSA,  migraines, and peripheral neuropathy.   Last seen by Dr. Burt Knack in September of 2019. Was felt to be doing well.   Seen by Sande Rives PA back in November of 2020. Continued to do well.   Comes in today. Here with   Past Medical History:  Diagnosis Date  . Anxiety   . Aortic stenosis   . Arthralgia   . Arthritis   . CAD (coronary artery disease)   . Carotid stenosis   . Cervicalgia   . CLUSTER HEADACHE 02/10/2007   Qualifier: Diagnosis of  By: Linna Darner MD, Gwyndolyn Saxon    . Colitis   . Edema   . Gout   . Hearing loss   . Herpes zoster without mention of complication   . Hyperlipidemia   . Hypertension   . Hyperuricemia   . Impacted cerumen   . Migraine   . Migraine   . Murmur   . Olecranon bursitis   . OSA (obstructive sleep apnea)   . Other and unspecified hyperlipidemia   . Other malaise and fatigue   . Peripheral neuropathy   . Polyneuropathy   . Pulmonary hypertension ([place])   . Unspecified hereditary and idiopathic peripheral neuropathy   . Venous insufficiency     Past Surgical History:  Procedure Laterality Date  . ANGIOPLASTY     w/ 2 stents in 1999  . APPENDECTOMY    . HEMORRHOID SURGERY    . LIPOMA EXCISION    . TONSILLECTOMY       Medications: No outpatient medications have been marked as taking for the 05/28/20 encounter (Appointment) with Burtis Junes, NP.     Allergies: Allergies  Allergen Reactions  . Lisinopril Other (See Comments)     Hyperkalemia in the context of combined spironolactone and lisinopril therapy  . Spironolactone Other (See Comments)    Hyperkalemia in context of spironolactone and lisinopril therapy    Social History: The patient  reports that he has never smoked. He has never used smokeless tobacco. He reports current alcohol use. He reports that he does not use drugs.   Family History: The patient's ***family history includes Diabetes in his brother, father, and sister; Healthy in his daughter and son; High blood pressure in his father; Other in his mother; Stroke in his father.   Review of Systems: Please see the history of present illness.   All other systems are reviewed and negative.   Physical Exam: VS:  There were no vitals taken for this visit. Marland Kitchen  BMI There is no height or weight on file to calculate BMI.  Wt Readings from Last 3 Encounters:  05/04/20 174 lb (78.9 kg)  04/10/20 170 lb (77.1 kg)  02/20/20 169 lb 12.8 oz (77 kg)    General: Pleasant. Well developed, well nourished and in no acute distress.   HEENT: Normal.  Neck: Supple, no JVD, carotid bruits, or masses  noted.  Cardiac: ***Regular rate and rhythm. No murmurs, rubs, or gallops. No edema.  Respiratory:  Lungs are clear to auscultation bilaterally with normal work of breathing.  GI: Soft and nontender.  MS: No deformity or atrophy. Gait and ROM intact.  Skin: Warm and dry. Color is normal.  Neuro:  Strength and sensation are intact and no gross focal deficits noted.  Psych: Alert, appropriate and with normal affect.   LABORATORY DATA:  EKG:  EKG {ACTION; IS/IS [id number]} ordered today.  Personally reviewed by me. This demonstrates ***.  Lab Results  Component Value Date   WBC 5.1 03/19/2018   HGB 13.6 03/19/2018   HCT 39.8 03/19/2018   PLT 101.0 (L) 03/19/2018   GLUCOSE 94 02/28/2020   CHOL 149 12/09/2017   TRIG 57.0 12/09/2017   HDL 72.30 12/09/2017   LDLCALC 65 12/09/2017   ALT 21 03/19/2018   AST 20  03/19/2018   NA 141 02/28/2020   K 4.6 02/28/2020   CL 107 02/28/2020   CREATININE 1.42 02/28/2020   BUN 33 (H) 02/28/2020   CO2 28 02/28/2020   TSH 1.97 09/24/2015   INR 0.9 03/19/2018   HGBA1C 5.1 09/24/2015     BNP (last 3 results) No results for input(s): BNP in the last 8760 hours.  ProBNP (last 3 results) No results for input(s): PROBNP in the last 8760 hours.   Other Studies Reviewed Today:  Echocardiogram 03/15/2015: Study Conclusions: - Left ventricle: LVOT is narrow. There is turbulent flow through the LVOT at rest . Peak and mean gradients through the LVOT/AV are 25 and 14 mm Hg respectively The cavity size was normal. Wall thickness was increased in a pattern of mild LVH. Systolic function was vigorous. The estimated ejection fraction was in the range of 65% to 70%. Doppler parameters are consistent with abnormal left ventricular relaxation (grade 1 diastolic dysfunction). - Aortic valve: AV is thickened with minimally restricted motion. There was mild regurgitation. - Mitral valve: Mild systolic anterior motion of the anteior mitral leaflet. Calcified annulus. Mildly thickened leaflets . - Left atrium: The atrium was severely dilated. - Pulmonary arteries: PA peak pressure: 51 mm Hg (S).    ASSESSMENT & PLAN    1. CAD -   2. HOCM  3. HTN  4. HLD   CAD without Angina - History of remote PCI to RCA and left circumflex in 1999. - Stable No angina.  - Continue aspirin, beta-blocker, and statin. Aspirin is not on medication list. Patient states he still takes it but not regularly like he should. Recommended patient restart Aspirin 81mg  daily.   Hypertrophic Cardiomyopathy - Most recent Echo from 03/2015 showed stable findings with LVEF of 65-70%, LV outflow tract obstruction with peak and mean gradients of 25 and 14 mmHg, respectively. Also showed aortic valve thickening with minimal restriction of leaflet motion, systolic anterior  motion of the mitral valve, and elevated PASP of 51 mmHg. - Appears euvolemic on exam. - Continue Losartan 100mg  daily and Lopressor 25mg  twice daily. - Will recheck Echo to re-evaluate LV outflow tract obstruction given patient is very active for an 86 year old and it has been 4.5 years since last Echo.  Hypertension - BP mildly elevated at 150/72. - Continue Losartan 100mg  daily and Lopressor 25mg  twice daily. Patient also on Terazosin 10mg  daily. - Asked patient to keep a BP log for the next 2-3 weeks and call us with the results. If BP consistently elevated, may add low dose HCTZ given  reports of mild ankle edema. However given age, LV outflow obstruction, and some reports of mild lightheaded/dizziness after taking medications; I also want to be cautious and avoid lowering BP too much.  Hyperlipidemia - Lipid panel form 12/2017: Total Chol 121, Trig 72, HDL 29, LDL 78. - Close to LDL goal of <70 given CAD. - Continue Pravastatin 40mg  daily.  - Labs  followed by PCP.   Current medicines are reviewed with the patient today.  The patient does not have concerns regarding medicines other than what has been noted above.  The following changes have been made:  See above.  Labs/ tests ordered today include:   No orders of the defined types were placed in this encounter.    Disposition:   FU with *** in {gen number [id number]} {Days to years:10300}.   Patient is agreeable to this plan and will call if any problems develop in the interim.   SignedTruitt Merle, NP  05/21/2020 7:39 AM  [facility] [address] [place], [place]  [address] Phone: [phone] Fax: [phone]

## 2020-05-28 ENCOUNTER — Ambulatory Visit: Payer: Medicare Other | Admitting: Nurse Practitioner

## 2020-05-28 DIAGNOSIS — M19011 Primary osteoarthritis, right shoulder: Secondary | ICD-10-CM | POA: Diagnosis not present

## 2020-05-28 DIAGNOSIS — M19012 Primary osteoarthritis, left shoulder: Secondary | ICD-10-CM | POA: Diagnosis not present

## 2020-06-19 ENCOUNTER — Telehealth: Payer: Self-pay | Admitting: Internal Medicine

## 2020-06-19 NOTE — Telephone Encounter (Signed)
    Patient wants to know at his advanced age should he get the 3rd COVID booster

## 2020-06-19 NOTE — Telephone Encounter (Signed)
Notified pt w/MD response.../lmb 

## 2020-06-19 NOTE — Telephone Encounter (Signed)
Yes he should get it

## 2020-06-23 NOTE — Progress Notes (Signed)
Subjective:    Patient ID: Paul Mays, male    DOB: 09/14/1928, 86 y.o.   MRN: [id number]  HPI The patient is here for an acute visit.   Redness on arms: He has chronic bruising on his arms that comes and goes.  He states minimal trauma.  This is been a chronic thing, but he wonders what he can do to treat this.  He takes two aleve at night.  He takes two tylenol in the morning.  He does not take the ASA 81 mg, he stopped the omeprazole 2 days ago because he did not think he needed it and wondered if that was contributing.  He denies any GERD.      Medications and allergies reviewed with patient and updated if appropriate.  Patient Active Problem List   Diagnosis Date Noted   Bilateral leg edema 01/26/2020   GERD (gastroesophageal reflux disease) 01/26/2020   Chronic pain of both shoulders 12/10/2018   Easy bruising 03/20/2018   Decreased hearing of right ear 09/04/2017   Rotator cuff tear arthropathy of both shoulders 10/18/2015   Thrombocytopenia ([place]) 09/24/2015   Diverticulosis of colon without hemorrhage 07/02/2014   Hypertrophic cardiomyopathy ([place]) 03/24/2011   OBSTRUCTIVE SLEEP APNEA 11/08/2010   HYPERTENSION, PULMONARY 09/16/2010   HEARING LOSS, BILATERAL 06/18/2009   Coronary atherosclerosis 03/07/2009   Aortic valve disorder, mod AR, mod AS 03/07/2009   CAROTID STENOSIS 03/07/2009   Hyperlipidemia 11/15/2008   Peripheral neuropathy, idiopathic 11/15/2008   Essential hypertension 11/15/2008   ARTHRALGIA 11/15/2008   COLITIS, HX OF 11/15/2008   VENOUS INSUFFICIENCY 04/20/2007   Gout 02/10/2007    Current Outpatient Medications on File Prior to Visit  Medication Sig Dispense Refill   allopurinol (ZYLOPRIM) 300 MG tablet TAKE 1 TABLET(300 MG) BY MOUTH DAILY AS NEEDED FOR GOUT FLARE 90 tablet 0   aspirin EC 81 MG tablet Take 1 tablet (81 mg total) by mouth daily.     Cholecalciferol (VITAMIN D3) 2000 UNITS capsule Take 1 capsule  (2,000 Units total) by mouth daily.     Flavocoxid (LIMBREL) 500 MG CAPS Take 500 mg by mouth daily.     furosemide (LASIX) 20 MG tablet Take 1 tablet (20 mg total) by mouth daily. 90 tablet 1   gabapentin (NEURONTIN) 600 MG tablet Take 1 tablet (600 mg total) by mouth 2 (two) times daily. 60 tablet 0   losartan (COZAAR) 50 MG tablet Take 1 tablet (50 mg total) by mouth daily. 90 tablet 3   metoprolol (LOPRESSOR) 50 MG tablet Take 25 mg by mouth 2 (two) times daily. 1/2 tab by mouth two times a day.      pravastatin (PRAVACHOL) 40 MG tablet Take 40 mg by mouth daily.       terazosin (HYTRIN) 10 MG capsule Take 12 mg by mouth at bedtime.     traMADol (ULTRAM) 50 MG tablet Take 1 tablet (50 mg total) by mouth every 12 (twelve) hours as needed for moderate pain (for chronic shoulder pain). 60 tablet 1   No current facility-administered medications on file prior to visit.    Past Medical History:  Diagnosis Date   Anxiety    Aortic stenosis    Arthralgia    Arthritis    CAD (coronary artery disease)    Carotid stenosis    Cervicalgia    CLUSTER HEADACHE 02/10/2007   Qualifier: Diagnosis of  By: Linna Darner MD, Gwyndolyn Saxon     Colitis    Edema  Gout    Hearing loss    Herpes zoster without mention of complication    Hyperlipidemia    Hypertension    Hyperuricemia    Impacted cerumen    Migraine    Migraine    Murmur    Olecranon bursitis    OSA (obstructive sleep apnea)    Other and unspecified hyperlipidemia    Other malaise and fatigue    Peripheral neuropathy    Polyneuropathy    Pulmonary hypertension ([facility])    Unspecified hereditary and idiopathic peripheral neuropathy    Venous insufficiency     Past Surgical History:  Procedure Laterality Date   ANGIOPLASTY     w/ 2 stents in [place] History   Socioeconomic History   Marital status:  Married    Spouse name: [name]   Number of children: 2   Years of education: college   Highest education level: Not on file  Occupational History   Occupation: retired    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 Patient finished  two years of college.    Pt has children.   Caffeine -two cups daily.   Left handed.   Retired Licensed conveyancer                    Social Determinants of Radio broadcast assistant Strain:    Difficulty of Paying Living Expenses: Not on file  Food Insecurity:    Worried About Charity fundraiser in the Last Year: Not on file   [employer] of Food in the Last Year: Not on file  Transportation Needs:    Lack of Transportation (Medical): Not on file   Lack of Transportation (Non-Medical): Not on file  Physical Activity:    Days of Exercise per Week: Not on file   Minutes of Exercise per Session: Not on file  Stress:    Feeling of Stress : Not on file  Social Connections:    Frequency of Communication with Friends and Family: Not on file   Frequency of Social Gatherings with Friends and Family: Not on file   Attends Religious Services: Not on file   Active Member of Clubs or Organizations: Not on file   Attends Archivist Meetings: Not on file   Marital Status: Not on file    Family History  Problem Relation Age of Onset   Stroke Father        Died, 66   High blood pressure Father    Diabetes Father    Other Mother        Died, 65   Diabetes Brother        Died, 20   Diabetes Sister         Living, 20   Healthy Daughter    Healthy Son  Review of Systems  Constitutional: Negative for fever.  Respiratory: Negative for shortness of breath.   Cardiovascular: Negative for chest pain, palpitations and leg swelling.  Neurological: Negative for light-headedness and headaches.  Hematological: Bruises/bleeds easily.       Objective:   Vitals:   06/25/20 1016  BP: 140/72  Pulse: (!) 56  Temp: 98.2 F (36.8 C)  SpO2: 94%   BP Readings from Last 3 Encounters:  06/25/20 140/72  05/04/20 (!) 158/68  04/10/20 (!) 172/84   Wt Readings from Last 3 Encounters:  06/25/20 163 lb 9.6 oz (74.2 kg)  05/04/20 174 lb (78.9 kg)  04/10/20 170 lb (77.1 kg)   Body mass index is 26.41 kg/m.   Physical Exam Constitutional:      General: He is not in acute distress.    Appearance: Normal appearance. He is not ill-appearing.  HENT:     Head: Normocephalic and atraumatic.  Musculoskeletal:     Right lower leg: No edema.     Left lower leg: No edema.  Skin:    General: Skin is warm and dry.     Findings: Bruising (Several areas of bruising on bilateral hands and posterior arms.  Only a couple on his lower legs.) present.  Neurological:     Mental Status: He is alert.            Assessment & Plan:    See Problem List for Assessment and Plan of chronic medical problems.    This visit occurred during the SARS-CoV-2 public health emergency.  Safety protocols were in place, including screening questions prior to the visit, additional usage of staff PPE, and extensive cleaning of exam room while observing appropriate contact time as indicated for disinfecting solutions.

## 2020-06-25 ENCOUNTER — Other Ambulatory Visit: Payer: Self-pay

## 2020-06-25 ENCOUNTER — Encounter: Payer: Self-pay | Admitting: Internal Medicine

## 2020-06-25 ENCOUNTER — Ambulatory Visit (INDEPENDENT_AMBULATORY_CARE_PROVIDER_SITE_OTHER): Payer: Medicare Other | Admitting: Internal Medicine

## 2020-06-25 VITALS — BP 140/72 | HR 56 | Temp 98.2°F | Wt 163.6 lb

## 2020-06-25 DIAGNOSIS — Z23 Encounter for immunization: Secondary | ICD-10-CM | POA: Diagnosis not present

## 2020-06-25 DIAGNOSIS — R238 Other skin changes: Secondary | ICD-10-CM

## 2020-06-25 DIAGNOSIS — K219 Gastro-esophageal reflux disease without esophagitis: Secondary | ICD-10-CM

## 2020-06-25 DIAGNOSIS — R233 Spontaneous ecchymoses: Secondary | ICD-10-CM

## 2020-06-25 MED ORDER — OMEPRAZOLE 20 MG PO CPDR
20.0000 mg | DELAYED_RELEASE_CAPSULE | Freq: Every day | ORAL | 3 refills | Status: DC | PRN
Start: 2020-06-25 — End: 2020-08-27

## 2020-06-25 NOTE — Assessment & Plan Note (Signed)
Chronic We have discussed the bruising on his arms in the past He is not currently taking a baby aspirin and will continue to hold that He is taking 2 Aleve at night and advised him to switch to Tylenol.  Can take 3000 mg a day He stopped the omeprazole, which could be contributing as well He will try cream with Arnica and it to see if that helps He understands that some of that bruising is related to other factors that we cannot change CBC, CMP-he has had blood work done at the [place] at some point, but none recently here

## 2020-06-25 NOTE — Assessment & Plan Note (Signed)
Chronic No gerd currently He has not taken omeprazole in a couple of days Stop omeprazole daily and take prn

## 2020-06-25 NOTE — Patient Instructions (Addendum)
Have blood work done today downstairs.    Flu immunization administered today.     Medications reviewed and updated.  Changes include :       Stop aleve and take tylenol only for your pain - you can take up to 3000mg  of tylenol a day.    Take the omeprazole as needed only.    Try a cream with Arnica in it.  This often helps with the bruising.

## 2020-06-26 LAB — CBC WITH DIFFERENTIAL/PLATELET
Absolute Monocytes: 530 cells/uL (ref 200–950)
Basophils Absolute: 31 cells/uL (ref 0–200)
Basophils Relative: 0.6 %
Eosinophils Absolute: 138 cells/uL (ref 15–500)
Eosinophils Relative: 2.7 %
HCT: 36.7 % — ABNORMAL LOW (ref 38.5–50.0)
Hemoglobin: 12.1 g/dL — ABNORMAL LOW (ref 13.2–17.1)
Lymphs Abs: 826 cells/uL — ABNORMAL LOW (ref 850–3900)
MCH: 33.3 pg — ABNORMAL HIGH (ref 27.0–33.0)
MCHC: 33 g/dL (ref 32.0–36.0)
MCV: 101.1 fL — ABNORMAL HIGH (ref 80.0–100.0)
MPV: 12 fL (ref 7.5–12.5)
Monocytes Relative: 10.4 %
Neutro Abs: 3575 cells/uL (ref 1500–7800)
Neutrophils Relative %: 70.1 %
Platelets: 121 10*3/uL — ABNORMAL LOW (ref 140–400)
RBC: 3.63 10*6/uL — ABNORMAL LOW (ref 4.20–5.80)
RDW: 13.1 % (ref 11.0–15.0)
Total Lymphocyte: 16.2 %
WBC: 5.1 10*3/uL (ref 3.8–10.8)

## 2020-06-26 LAB — COMPLETE METABOLIC PANEL WITH GFR
AG Ratio: 2.6 (calc) — ABNORMAL HIGH (ref 1.0–2.5)
ALT: 12 U/L (ref 9–46)
AST: 14 U/L (ref 10–35)
Albumin: 3.9 g/dL (ref 3.6–5.1)
Alkaline phosphatase (APISO): 31 U/L — ABNORMAL LOW (ref 35–144)
BUN/Creatinine Ratio: 17 (calc) (ref 6–22)
BUN: 21 mg/dL (ref 7–25)
CO2: 26 mmol/L (ref 20–32)
Calcium: 8.8 mg/dL (ref 8.6–10.3)
Chloride: 107 mmol/L (ref 98–110)
Creat: 1.25 mg/dL — ABNORMAL HIGH (ref 0.70–1.11)
GFR, Est African American: 58 mL/min/{1.73_m2} — ABNORMAL LOW (ref >=60)
GFR, Est Non African American: 50 mL/min/{1.73_m2} — ABNORMAL LOW (ref >=60)
Globulin: 1.5 g/dL (calc) — ABNORMAL LOW (ref 1.9–3.7)
Glucose, Bld: 104 mg/dL — ABNORMAL HIGH (ref 65–99)
Potassium: 4.9 mmol/L (ref 3.5–5.3)
Sodium: 142 mmol/L (ref 135–146)
Total Bilirubin: 1.5 mg/dL — ABNORMAL HIGH (ref 0.2–1.2)
Total Protein: 5.4 g/dL — ABNORMAL LOW (ref 6.1–8.1)

## 2020-06-29 DIAGNOSIS — H0011 Chalazion right upper eyelid: Secondary | ICD-10-CM | POA: Diagnosis not present

## 2020-06-29 DIAGNOSIS — H0014 Chalazion left upper eyelid: Secondary | ICD-10-CM | POA: Diagnosis not present

## 2020-07-02 NOTE — Progress Notes (Signed)
CARDIOLOGY OFFICE NOTE  Date:  07/10/2020    [name] Date of Birth: July 01, 1928 Medical Record #[id number]  PCP:  Binnie Rail, MD  Cardiologist:  Burt Knack  Chief Complaint  Patient presents with  . Follow-up    Seen for Dr. Burt Knack    History of Present Illness: ROI JAFARI is a 86 y.o. male who presents today for a follow up visit. This is an 11 month check. Seen for Dr. Burt Knack.   He has a history of known CAD s/p remote PCI with PCI to RCA and LCX in 1999 per Dr. Olevia Perches, mild LVH with septal hypertrophy/LV outflow obstruction noted on Echo in 2016, HTN, HLD, OSA, migraines, and peripheral neuropathy.   Last seen by Dr. Burt Knack in 2019.    Saw Callie last year and was continuing to do well. Very mild dizziness.   Comes in today. Here alone. His wife is from [place] - knows our family. He is doing well. Feels good. No chest pain. Not short of breath. No syncope. He stays active. His mind is sharp. He had labs last month and thru the [place]. Tolerating his medicines. BP is ok. He feels like he is doing well.   Past Medical History:  Diagnosis Date  . Anxiety   . Aortic stenosis   . Arthralgia   . Arthritis   . CAD (coronary artery disease)   . Carotid stenosis   . Cervicalgia   . CLUSTER HEADACHE 02/10/2007   Qualifier: Diagnosis of  By: Linna Darner MD, Gwyndolyn Saxon    . Colitis   . Edema   . Gout   . Hearing loss   . Herpes zoster without mention of complication   . Hyperlipidemia   . Hypertension   . Hyperuricemia   . Impacted cerumen   . Migraine   . Migraine   . Murmur   . Olecranon bursitis   . OSA (obstructive sleep apnea)   . Other and unspecified hyperlipidemia   . Other malaise and fatigue   . Peripheral neuropathy   . Polyneuropathy   . Pulmonary hypertension ([place])   . Unspecified hereditary and idiopathic peripheral neuropathy   . Venous insufficiency     Past Surgical History:  Procedure Laterality Date  . ANGIOPLASTY     w/ 2 stents in 1999    . APPENDECTOMY    . HEMORRHOID SURGERY    . LIPOMA EXCISION    . TONSILLECTOMY       Medications: Current Meds  Medication Sig  . allopurinol (ZYLOPRIM) 300 MG tablet TAKE 1 TABLET(300 MG) BY MOUTH DAILY AS NEEDED FOR GOUT FLARE  . aspirin EC 81 MG tablet Take 1 tablet (81 mg total) by mouth daily.  . Cholecalciferol (VITAMIN D3) 2000 UNITS capsule Take 1 capsule (2,000 Units total) by mouth daily.  . Flavocoxid (LIMBREL) 500 MG CAPS Take 500 mg by mouth daily.  . furosemide (LASIX) 20 MG tablet Take 1 tablet (20 mg total) by mouth daily.  Marland Kitchen gabapentin (NEURONTIN) 600 MG tablet Take 1 tablet (600 mg total) by mouth 2 (two) times daily.  Marland Kitchen losartan (COZAAR) 50 MG tablet Take 1 tablet (50 mg total) by mouth daily.  . metoprolol (LOPRESSOR) 50 MG tablet Take 25 mg by mouth 2 (two) times daily. 1/2 tab by mouth two times a day.   . neomycin-polymyxin b-dexamethasone (MAXITROL) 3.5-10000-0.1 OINT SMARTSIG:0.25 Strip(s) In Eye(s) Twice Daily  . omeprazole (PRILOSEC) 20 MG capsule Take 1 capsule (20  mg total) by mouth daily as needed. Take two tabs daily for one month, then one tab daily  . pravastatin (PRAVACHOL) 40 MG tablet Take 40 mg by mouth daily.    Marland Kitchen terazosin (HYTRIN) 10 MG capsule Take 12 mg by mouth at bedtime.  . traMADol (ULTRAM) 50 MG tablet Take 1 tablet (50 mg total) by mouth every 12 (twelve) hours as needed for moderate pain (for chronic shoulder pain).     Allergies: Allergies  Allergen Reactions  . Lisinopril Other (See Comments)    Hyperkalemia in the context of combined spironolactone and lisinopril therapy  . Spironolactone Other (See Comments)    Hyperkalemia in context of spironolactone and lisinopril therapy    Social History: The patient  reports that he has never smoked. He has never used smokeless tobacco. He reports current alcohol use. He reports that he does not use drugs.   Family History: The patient's family history includes Diabetes in his  brother, father, and sister; Healthy in his daughter and son; High blood pressure in his father; Other in his mother; Stroke in his father.   Review of Systems: Please see the history of present illness.   All other systems are reviewed and negative.   Physical Exam: VS:  BP 140/60   Pulse (!) 55   Ht 5\' 6"  (1.676 m)   Wt 164 lb 3.2 oz (74.5 kg)   SpO2 95%   BMI 26.50 kg/m  .  BMI Body mass index is 26.5 kg/m.  Wt Readings from Last 3 Encounters:  07/10/20 164 lb 3.2 oz (74.5 kg)  06/25/20 163 lb 9.6 oz (74.2 kg)  05/04/20 174 lb (78.9 kg)    General: Elderly. Alert. He is in no acute distress. He looks younger than his stated age.  Cardiac: Regular rate and rhythm. Outflow murmur noted. No edema.  Respiratory:  Lungs are clear to auscultation bilaterally with normal work of breathing.  GI: Soft and nontender.  MS: No deformity or atrophy. Gait and ROM intact.  Skin: Warm and dry. Color is normal.  Neuro:  Strength and sensation are intact and no gross focal deficits noted.  Psych: Alert, appropriate and with normal affect.   LABORATORY DATA:  EKG:  EKG is ordered today.  Personally reviewed by me. This demonstrates sinus bradycardia with 1st degree AV block.   Lab Results  Component Value Date   WBC 5.1 06/25/2020   HGB 12.1 (L) 06/25/2020   HCT 36.7 (L) 06/25/2020   PLT 121 (L) 06/25/2020   GLUCOSE 104 (H) 06/25/2020   CHOL 149 12/09/2017   TRIG 57.0 12/09/2017   HDL 72.30 12/09/2017   LDLCALC 65 12/09/2017   ALT 12 06/25/2020   AST 14 06/25/2020   NA 142 06/25/2020   K 4.9 06/25/2020   CL 107 06/25/2020   CREATININE 1.25 (H) 06/25/2020   BUN 21 06/25/2020   CO2 26 06/25/2020   TSH 1.97 09/24/2015   INR 0.9 03/19/2018   HGBA1C 5.1 09/24/2015     BNP (last 3 results) No results for input(s): BNP in the last 8760 hours.  ProBNP (last 3 results) No results for input(s): PROBNP in the last 8760 hours.   Other Studies Reviewed Today:  ECHO IMPRESSIONS  09/2019  1. Left ventricular ejection fraction, by visual estimation, is 65 to  70%. The left ventricle has hyperdynamic function. Left ventricular basal  septal wall thickness was severely increased. There is moderately  increased left ventricular hypertrophy in  the  remaininf myocardium.  2. Global right ventricle has normal systolic function.The right  ventricular size is normal. No increase in right ventricular wall  thickness.  3. Left atrial size was severely dilated.  4. Right atrial size was moderately dilated.  5. The mitral valve is normal in structure. Mild mitral valve  regurgitation. No evidence of mitral stenosis.  6. The tricuspid valve is normal in structure. Tricuspid valve  regurgitation is severe.  7. The aortic valve is normal in structure. Aortic valve regurgitation is  moderate. Moderate aortic valve stenosis.  8. The pulmonic valve was normal in structure. Pulmonic valve  regurgitation is mild.  9. Moderately elevated pulmonary artery systolic pressure.  10. The inferior vena cava is normal in size with greater than 50%  respiratory variability, suggesting right atrial pressure of 3 mmHg.  11. The average left ventricular global longitudinal strain is -16.5 %.  12. Aortic valve regurgitation is moderate.  13. Left ventricular diastolic parameters are consistent with Grade II  diastolic dysfunction (pseudonormalization).  14. Elevated left atrial and left ventricular end-diastolic pressures.  15. The tricuspid regurgitant velocity is 3.77 m/s, and with an assumed  right atrial pressure of 8 mmHg, the estimated right ventricular systolic  pressure is moderately elevated at 64.9 mmHg.  16. Moderate mitral annular calcification.  17. Findings are consistent with hypertrophic cardiomyopathy. There is  LVOT gradient of 42 mmHg as well as moderate aortic stenosis.    Assessment & Plan:    1. CAD - remote PCI to the RCA and LCX from 1999 - he is doing  well - no worrisome symptoms. Continue aspirin/beta blocker and statin therapy.   2. HOCM - echo from December noted.   3. AS - need to follow - he has no cardinal symptoms.   4. HTN - BP is ok on his current regimen - would continue   5. HLD - [facility] and [facility]. No changes made today.   6. Advanced age - he is doing quite well.   Current medicines are reviewed with the patient today.  The patient does not have concerns regarding medicines other than what has been noted above.  The following changes have been made:  See above.  Labs/ tests ordered today include:    Orders Placed This Encounter  Procedures  . EKG 12-Lead     Disposition:   FU with Dr. Burt Knack in about 5 to 6 months. Consider repeat echo later in 2022.     Patient is agreeable to this plan and will call if any problems develop in the interim.   SignedTruitt Merle, NP  07/10/2020 4:25 PM  [place] [address] [place], [place]  [address] Phone: [phone] Fax: [phone]

## 2020-07-05 DIAGNOSIS — H0011 Chalazion right upper eyelid: Secondary | ICD-10-CM | POA: Diagnosis not present

## 2020-07-10 ENCOUNTER — Ambulatory Visit (INDEPENDENT_AMBULATORY_CARE_PROVIDER_SITE_OTHER): Payer: Medicare Other | Admitting: Nurse Practitioner

## 2020-07-10 ENCOUNTER — Encounter: Payer: Self-pay | Admitting: Nurse Practitioner

## 2020-07-10 ENCOUNTER — Other Ambulatory Visit: Payer: Self-pay

## 2020-07-10 VITALS — BP 140/60 | HR 55 | Ht 66.0 in | Wt 164.2 lb

## 2020-07-10 DIAGNOSIS — I2510 Atherosclerotic heart disease of native coronary artery without angina pectoris: Secondary | ICD-10-CM

## 2020-07-10 DIAGNOSIS — I422 Other hypertrophic cardiomyopathy: Secondary | ICD-10-CM

## 2020-07-10 DIAGNOSIS — I10 Essential (primary) hypertension: Secondary | ICD-10-CM

## 2020-07-10 DIAGNOSIS — E785 Hyperlipidemia, unspecified: Secondary | ICD-10-CM | POA: Diagnosis not present

## 2020-07-10 NOTE — Patient Instructions (Addendum)
After Visit Summary:  We will be checking the following labs today - NONE   Medication Instructions:    Continue with your current medicines.    If you need a refill on your cardiac medications before your next appointment, please call your pharmacy.     Testing/Procedures To Be Arranged:  N/A  Follow-Up:   See Dr. Cooper in 6 months.     At [facility], you and your health needs are our priority.  As part of our continuing mission to provide you with exceptional heart care, we have created designated Provider Care Teams.  These Care Teams include your primary Cardiologist (physician) and Advanced Practice Providers (APPs -  Physician Assistants and Nurse Practitioners) who all work together to provide you with the care you need, when you need it.  Special Instructions:  . Stay safe, wash your hands for at least 20 seconds and wear a mask when needed.  . It was good to talk with you today.    Call the [facility] office at [phone] if you have any questions, problems or concerns.       

## 2020-08-03 ENCOUNTER — Other Ambulatory Visit: Payer: Self-pay | Admitting: Internal Medicine

## 2020-08-20 NOTE — Progress Notes (Signed)
Subjective:    Patient ID: Paul Mays, male    DOB: 07/26/1928, 86 y.o.   MRN: [id number]  HPI The patient is here for an acute visit for a bump on his head.   Two week s ago he fell - tripped on a stake in the yard after getting the mail.  He hit his head and entire right side on concrete.  There was no LOC.  He had people around him immediately.  He felt ok and did not get evaluated.  He has had some lightheadedness since then which has improved.  He denies headaches, blurry vision, nausea.  He has bruising on his right foot, leg and arm, but denies pain. He was concerned because he still has a bump on his right forehead that is bruising.  It has improved since the fall and he has even noticed improvement since yesterday.     Medications and allergies reviewed with patient and updated if appropriate.  Patient Active Problem List   Diagnosis Date Noted  . Bilateral leg edema 01/26/2020  . GERD (gastroesophageal reflux disease) 01/26/2020  . Chronic pain of both shoulders 12/10/2018  . Easy bruising 03/20/2018  . Decreased hearing of right ear 09/04/2017  . Rotator cuff tear arthropathy of both shoulders 10/18/2015  . Thrombocytopenia ([place]) 09/24/2015  . Diverticulosis of colon without hemorrhage 07/02/2014  . Hypertrophic cardiomyopathy ([place]) 03/24/2011  . OBSTRUCTIVE SLEEP APNEA 11/08/2010  . HYPERTENSION, PULMONARY 09/16/2010  . HEARING LOSS, BILATERAL 06/18/2009  . Coronary atherosclerosis 03/07/2009  . Aortic valve disorder, mod AR, mod AS 03/07/2009  . CAROTID STENOSIS 03/07/2009  . Hyperlipidemia 11/15/2008  . Peripheral neuropathy, idiopathic 11/15/2008  . Essential hypertension 11/15/2008  . ARTHRALGIA 11/15/2008  . COLITIS, HX OF 11/15/2008  . VENOUS INSUFFICIENCY 04/20/2007  . Gout 02/10/2007    Current Outpatient Medications on File Prior to Visit  Medication Sig Dispense Refill  . allopurinol (ZYLOPRIM) 300 MG tablet TAKE 1 TABLET(300 MG) BY MOUTH DAILY  AS NEEDED FOR GOUT FLARE 90 tablet 0  . aspirin EC 81 MG tablet Take 1 tablet (81 mg total) by mouth daily.    . Cholecalciferol (VITAMIN D3) 2000 UNITS capsule Take 1 capsule (2,000 Units total) by mouth daily.    . Flavocoxid (LIMBREL) 500 MG CAPS Take 500 mg by mouth daily.    . furosemide (LASIX) 20 MG tablet Take 1 tablet (20 mg total) by mouth daily. 90 tablet 1  . gabapentin (NEURONTIN) 600 MG tablet Take 1 tablet (600 mg total) by mouth 2 (two) times daily. 60 tablet 0  . losartan (COZAAR) 50 MG tablet Take 1 tablet (50 mg total) by mouth daily. 90 tablet 3  . metoprolol (LOPRESSOR) 50 MG tablet Take 25 mg by mouth 2 (two) times daily. 1/2 tab by mouth two times a day.     . neomycin-polymyxin b-dexamethasone (MAXITROL) 3.5-10000-0.1 OINT SMARTSIG:0.25 Strip(s) In Eye(s) Twice Daily    . omeprazole (PRILOSEC) 20 MG capsule Take 1 capsule (20 mg total) by mouth daily as needed. Take two tabs daily for one month, then one tab daily 60 capsule 3  . pravastatin (PRAVACHOL) 40 MG tablet Take 40 mg by mouth daily.      Marland Kitchen terazosin (HYTRIN) 10 MG capsule Take 12 mg by mouth at bedtime.    . traMADol (ULTRAM) 50 MG tablet Take 1 tablet (50 mg total) by mouth every 12 (twelve) hours as needed for moderate pain (for chronic shoulder pain). 60 tablet  1   No current facility-administered medications on file prior to visit.    Past Medical History:  Diagnosis Date  . Anxiety   . Aortic stenosis   . Arthralgia   . Arthritis   . CAD (coronary artery disease)   . Carotid stenosis   . Cervicalgia   . CLUSTER HEADACHE 02/10/2007   Qualifier: Diagnosis of  By: Linna Darner MD, Gwyndolyn Saxon    . Colitis   . Edema   . Gout   . Hearing loss   . Herpes zoster without mention of complication   . Hyperlipidemia   . Hypertension   . Hyperuricemia   . Impacted cerumen   . Migraine   . Migraine   . Murmur   . Olecranon bursitis   . OSA (obstructive sleep apnea)   . Other and unspecified hyperlipidemia   .  Other malaise and fatigue   . Peripheral neuropathy   . Polyneuropathy   . Pulmonary hypertension ([place])   . Unspecified hereditary and idiopathic peripheral neuropathy   . Venous insufficiency     Past Surgical History:  Procedure Laterality Date  . ANGIOPLASTY     w/ 2 stents in 1999  . APPENDECTOMY    . HEMORRHOID SURGERY    . LIPOMA EXCISION    . TONSILLECTOMY      Social History   Socioeconomic History  . Marital status: Married    Spouse name: [name]  . Number of children: 2  . Years of education: college  . Highest education level: Not on file  Occupational History  . 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 Smoking status: Never Smoker  . Smokeless tobacco: Never Used  Vaping Use  . Vaping Use: Never used  Substance and Sexual Activity  . Alcohol use: Yes    Alcohol/week: 0.0 standard drinks    Comment: 2 drink before dinner vodka   . Drug use: No  . Sexual activity: Never  Other Topics Concern  . Not on file  Social History Narrative   Pt is married and lives with wife (suzanne). Patient finished  two years of college.    Pt has children.   Caffeine -two cups daily.   Left handed.   Retired Licensed conveyancer                    Social Determinants of Radio broadcast assistant Strain:   . Difficulty of Paying Living Expenses: Not on file  Food Insecurity:   . Worried About Charity fundraiser in the Last Year: Not on file  . Ran Out of Food in the Last Year: Not on file  Transportation Needs:   . Lack of Transportation (Medical): Not on file  . Lack of Transportation (Non-Medical): Not on file  Physical Activity:   . Days of Exercise per Week: Not on file  . Minutes of Exercise per Session: Not on file  Stress:   . Feeling of Stress : Not on file  Social Connections:   . Frequency of Communication with Friends and Family: Not on file  . Frequency of Social Gatherings with Friends  and Family: Not on file  . Attends Religious Services: Not on file  . Active Member of Clubs or Organizations: Not on file  . Attends Archivist Meetings: Not on file  . Marital Status: Not on file    Family History  Problem Relation Age of Onset  . Stroke Father        Died, 83  . High blood pressure Father   . Diabetes Father   . Other Mother        Died, 88  . Diabetes Brother        Died, 82  . Diabetes Sister        Living, 44  . Healthy Daughter   . Healthy Son     Review of Systems  Constitutional: Negative for fever.  Eyes: Negative for visual disturbance.  Respiratory: Negative for shortness of breath.   Cardiovascular: Positive for leg swelling. Negative for chest pain and palpitations.  Gastrointestinal: Negative for nausea.  Neurological: Positive for light-headedness. Negative for dizziness and headaches.  Psychiatric/Behavioral: Negative for confusion.       Objective:   Vitals:   08/21/20 1511  BP: 124/70  Pulse: (!) 58  Temp: 98.3 F (36.8 C)  SpO2: 96%   BP Readings from Last 3 Encounters:  08/21/20 124/70  07/10/20 140/60  06/25/20 140/72   Wt Readings from Last 3 Encounters:  08/21/20 165 lb (74.8 kg)  07/10/20 164 lb 3.2 oz (74.5 kg)  06/25/20 163 lb 9.6 oz (74.2 kg)   Body mass index is 26.63 kg/m.   Physical Exam Constitutional:      General: He is not in acute distress.    Appearance: Normal appearance. He is not ill-appearing.  HENT:     Head: Normocephalic.     Comments: Fading ecchymosis around right eye, right eyebrow.  Hematoma right forehead that is soft, mild tenderness with overlying ecchymosis    Nose: Nose normal.  Eyes:     Conjunctiva/sclera: Conjunctivae normal.  Skin:    General: Skin is warm and dry.     Findings: Bruising (extensive on right arm, leg and foot) present. No lesion (no open wounds).  Neurological:     General: No focal deficit present.     Mental Status: He is alert and oriented to  person, place, and time.  Psychiatric:        Mood and Affect: Mood normal.        Behavior: Behavior normal.        Thought Content: Thought content normal.        Judgment: Judgment normal.            Assessment & Plan:    See Problem List for Assessment and Plan of chronic medical problems.    This visit occurred during the SARS-CoV-2 public health emergency.  Safety protocols were in place, including screening questions prior to the visit, additional usage of staff PPE, and extensive cleaning of exam room while observing appropriate contact time as indicated for disinfecting solutions.

## 2020-08-21 ENCOUNTER — Other Ambulatory Visit: Payer: Self-pay

## 2020-08-21 ENCOUNTER — Ambulatory Visit (INDEPENDENT_AMBULATORY_CARE_PROVIDER_SITE_OTHER): Payer: Medicare Other | Admitting: Internal Medicine

## 2020-08-21 ENCOUNTER — Encounter: Payer: Self-pay | Admitting: Internal Medicine

## 2020-08-21 DIAGNOSIS — S0990XA Unspecified injury of head, initial encounter: Secondary | ICD-10-CM | POA: Diagnosis not present

## 2020-08-21 DIAGNOSIS — I2510 Atherosclerotic heart disease of native coronary artery without angina pectoris: Secondary | ICD-10-CM

## 2020-08-21 DIAGNOSIS — W19XXXA Unspecified fall, initial encounter: Secondary | ICD-10-CM | POA: Insufficient documentation

## 2020-08-21 NOTE — Assessment & Plan Note (Signed)
Acute Due to fall two weeks ago - has some lightheadedness and bruising Lightheadedness improving, so is the bruising No other concerning symptoms No need for imagine at this time reassured bruising and lightheadedness will continue to improve

## 2020-08-21 NOTE — Assessment & Plan Note (Signed)
Acute Occurred two weeks ago suffered mild head trauma and bruising throughout body Some lightheadedness but no other concerning symptoms bruising and lightheadedness improving No further evaluation or treatment needed Reassured the bruising and forehead swelling will improve over the next couple of weeks.

## 2020-08-21 NOTE — Patient Instructions (Signed)
Your bruising will continue to improve over the next couple of weeks.    Start using your cane.

## 2020-08-27 ENCOUNTER — Other Ambulatory Visit: Payer: Self-pay | Admitting: Internal Medicine

## 2020-09-11 ENCOUNTER — Telehealth: Payer: Self-pay | Admitting: Internal Medicine

## 2020-09-11 NOTE — Progress Notes (Signed)
  Chronic Care Management   Note  09/11/2020 Name: Paul Mays MRN: [id number] DOB: [birth year]  Paul Mays is a 86 y.o. year old male who is a primary care patient of Burns, Claudina Lick, MD. I reached out to [name] by phone today in response to a referral sent by Paul Mays's PCP, Binnie Rail, MD.   Paul Mays was given information about Chronic Care Management services today including:  1. CCM service includes personalized support from designated clinical staff supervised by his physician, including individualized plan of care and coordination with other care providers 2. 24/7 contact phone numbers for assistance for urgent and routine care needs. 3. Service will only be billed when office clinical staff spend 20 minutes or more in a month to coordinate care. 4. Only one practitioner may furnish and bill the service in a calendar month. 5. The patient may stop CCM services at any time (effective at the end of the month) by phone call to the office staff.   Patient wishes to consider information provided and/or speak with a member of the care team before deciding about enrollment in care management services.   Follow up plan:   Carley Perdue [employer] Scheduler

## 2020-09-20 ENCOUNTER — Telehealth: Payer: Self-pay | Admitting: Internal Medicine

## 2020-09-20 NOTE — Progress Notes (Signed)
  Chronic Care Management   Outreach Note  09/20/2020 Name: Paul Mays MRN: [id number] DOB: July 02, 1928  Referred by: Binnie Rail, MD Reason for referral : No chief complaint on file.   A second unsuccessful telephone outreach was attempted today. The patient was referred to pharmacist for assistance with care management and care coordination.  Follow Up Plan:   Carley Perdue [employer] Scheduler

## 2020-09-21 ENCOUNTER — Telehealth: Payer: Self-pay | Admitting: Internal Medicine

## 2020-09-21 NOTE — Progress Notes (Signed)
  Chronic Care Management   Note  09/21/2020 Name: KORTLAND NICHOLS MRN: [id number] DOB: [birth year]  LABRADFORD SCHNITKER is a 86 y.o. year old male who is a primary care patient of Burns, Claudina Lick, MD. I reached out to [name] by phone today in response to a referral sent by Mr. Caprice Renshaw Marciel's PCP, Binnie Rail, MD.   Mr. Helzer was given information about Chronic Care Management services today including:  1. CCM service includes personalized support from designated clinical staff supervised by his physician, including individualized plan of care and coordination with other care providers 2. 24/7 contact phone numbers for assistance for urgent and routine care needs. 3. Service will only be billed when office clinical staff spend 20 minutes or more in a month to coordinate care. 4. Only one practitioner may furnish and bill the service in a calendar month. 5. The patient may stop CCM services at any time (effective at the end of the month) by phone call to the office staff.   Patient wishes to consider information provided and/or speak with a member of the care team before deciding about enrollment in care management services.   Follow up plan:   Carley Perdue [employer] Scheduler

## 2020-10-02 DIAGNOSIS — Z85828 Personal history of other malignant neoplasm of skin: Secondary | ICD-10-CM | POA: Diagnosis not present

## 2020-10-02 DIAGNOSIS — L218 Other seborrheic dermatitis: Secondary | ICD-10-CM | POA: Diagnosis not present

## 2020-10-23 ENCOUNTER — Other Ambulatory Visit: Payer: Self-pay | Admitting: Internal Medicine

## 2020-10-25 ENCOUNTER — Telehealth: Payer: Self-pay | Admitting: Internal Medicine

## 2020-10-25 NOTE — Progress Notes (Signed)
  Chronic Care Management   Note  10/25/2020 Name: COLLIN RENGEL MRN: [id number] DOB: [birth year]  KARMA HINEY is a 87 y.o. year old male who is a primary care patient of Burns, Claudina Lick, MD. I reached out to [name] by phone today in response to a referral sent by Mr. Caprice Renshaw Medley's PCP, Binnie Rail, MD.   Mr. Petrovich was given information about Chronic Care Management services today including:  1. CCM service includes personalized support from designated clinical staff supervised by his physician, including individualized plan of care and coordination with other care providers 2. 24/7 contact phone numbers for assistance for urgent and routine care needs. 3. Service will only be billed when office clinical staff spend 20 minutes or more in a month to coordinate care. 4. Only one practitioner may furnish and bill the service in a calendar month. 5. The patient may stop CCM services at any time (effective at the end of the month) by phone call to the office staff.   Patient wishes to consider information provided and/or speak with a member of the care team before deciding about enrollment in care management services.   Follow up plan:   Carley Perdue [employer] Scheduler

## 2020-11-06 ENCOUNTER — Encounter: Payer: Self-pay | Admitting: Internal Medicine

## 2020-11-06 ENCOUNTER — Ambulatory Visit (INDEPENDENT_AMBULATORY_CARE_PROVIDER_SITE_OTHER): Payer: Medicare Other | Admitting: Internal Medicine

## 2020-11-06 ENCOUNTER — Other Ambulatory Visit: Payer: Self-pay

## 2020-11-06 DIAGNOSIS — S61419A Laceration without foreign body of unspecified hand, initial encounter: Secondary | ICD-10-CM | POA: Insufficient documentation

## 2020-11-06 DIAGNOSIS — S61411A Laceration without foreign body of right hand, initial encounter: Secondary | ICD-10-CM

## 2020-11-06 NOTE — Patient Instructions (Addendum)
Keep you hand bandaged up.    If it starts to bleed apply pressure and keep a bandage on it.      Call with any questions.

## 2020-11-06 NOTE — Assessment & Plan Note (Signed)
Acute After fall yesterday - > 24 hrs so not able to suture - will used dermabond dermabond applied, steristrips place and area bandaged No need for abx Monitor for infection If wound opens he will keep covered tdap up to date He will call with quesitons

## 2020-11-06 NOTE — Progress Notes (Signed)
Subjective:    Patient ID: Paul Mays, male    DOB: April 02, 1928, 87 y.o.   MRN: [id number]  HPI The patient is here for an acute visit.   Hand pain after fall - he fell yesterday and had the same fall 6 weeks weeks ago.  He tripped over a stake in his yard and his right hand landed underneath him. He has cut on his hand.  It is bleeding now and since the fall.  He denies other injuries.  He is sore all over, which is not new.  No fever   Medications and allergies reviewed with patient and updated if appropriate.  Patient Active Problem List   Diagnosis Date Noted  . Fall 08/21/2020  . Head trauma, initial encounter 08/21/2020  . Bilateral leg edema 01/26/2020  . GERD (gastroesophageal reflux disease) 01/26/2020  . Chronic pain of both shoulders 12/10/2018  . Easy bruising 03/20/2018  . Decreased hearing of right ear 09/04/2017  . Rotator cuff tear arthropathy of both shoulders 10/18/2015  . Thrombocytopenia ([place]) 09/24/2015  . Diverticulosis of colon without hemorrhage 07/02/2014  . Hypertrophic cardiomyopathy ([place]) 03/24/2011  . OBSTRUCTIVE SLEEP APNEA 11/08/2010  . HYPERTENSION, PULMONARY 09/16/2010  . HEARING LOSS, BILATERAL 06/18/2009  . Coronary atherosclerosis 03/07/2009  . Aortic valve disorder, mod AR, mod AS 03/07/2009  . CAROTID STENOSIS 03/07/2009  . Hyperlipidemia 11/15/2008  . Peripheral neuropathy, idiopathic 11/15/2008  . Essential hypertension 11/15/2008  . ARTHRALGIA 11/15/2008  . COLITIS, HX OF 11/15/2008  . VENOUS INSUFFICIENCY 04/20/2007  . Gout 02/10/2007    Current Outpatient Medications on File Prior to Visit  Medication Sig Dispense Refill  . allopurinol (ZYLOPRIM) 300 MG tablet TAKE 1 TABLET(300 MG) BY MOUTH DAILY AS NEEDED FOR GOUT FLARE 90 tablet 0  . aspirin EC 81 MG tablet Take 1 tablet (81 mg total) by mouth daily.    . Cholecalciferol (VITAMIN D3) 2000 UNITS capsule Take 1 capsule (2,000 Units total) by mouth daily.    . Flavocoxid  (LIMBREL) 500 MG CAPS Take 500 mg by mouth daily.    . furosemide (LASIX) 20 MG tablet Take 1 tablet (20 mg total) by mouth daily. 90 tablet 1  . gabapentin (NEURONTIN) 600 MG tablet Take 1 tablet (600 mg total) by mouth 2 (two) times daily. 60 tablet 0  . losartan (COZAAR) 50 MG tablet Take 1 tablet (50 mg total) by mouth daily. 90 tablet 3  . metoprolol (LOPRESSOR) 50 MG tablet Take 25 mg by mouth 2 (two) times daily. 1/2 tab by mouth two times a day.     . neomycin-polymyxin b-dexamethasone (MAXITROL) 3.5-10000-0.1 OINT SMARTSIG:0.25 Strip(s) In Eye(s) Twice Daily    . omeprazole (PRILOSEC) 20 MG capsule TAKE 2 CAPSULES DAILY FOR 1 MONTH, THEN 1 CAPSULE DAILY 60 capsule 3  . pravastatin (PRAVACHOL) 40 MG tablet Take 40 mg by mouth daily.      Marland Kitchen terazosin (HYTRIN) 10 MG capsule Take 12 mg by mouth at bedtime.    . traMADol (ULTRAM) 50 MG tablet Take 1 tablet (50 mg total) by mouth every 12 (twelve) hours as needed for moderate pain (for chronic shoulder pain). 60 tablet 1   No current facility-administered medications on file prior to visit.    Past Medical History:  Diagnosis Date  . Anxiety   . Aortic stenosis   . Arthralgia   . Arthritis   . CAD (coronary artery disease)   . Carotid stenosis   . Cervicalgia   .  CLUSTER HEADACHE 02/10/2007   Qualifier: Diagnosis of  By: Linna Darner MD, Gwyndolyn Saxon    . Colitis   . Edema   . Gout   . Hearing loss   . Herpes zoster without mention of complication   . Hyperlipidemia   . Hypertension   . Hyperuricemia   . Impacted cerumen   . Migraine   . Migraine   . Murmur   . Olecranon bursitis   . OSA (obstructive sleep apnea)   . Other and unspecified hyperlipidemia   . Other malaise and fatigue   . Peripheral neuropathy   . Polyneuropathy   . Pulmonary hypertension ([place])   . Unspecified hereditary and idiopathic peripheral neuropathy   . Venous insufficiency     Past Surgical History:  Procedure Laterality Date  . ANGIOPLASTY     w/ 2  stents in 1999  . APPENDECTOMY    . HEMORRHOID SURGERY    . LIPOMA EXCISION    . TONSILLECTOMY      Social History   Socioeconomic History  . Marital status: Married    Spouse name: [name]  . Number of children: 2  . Years of education: college  . Highest education level: Not on file  Occupational History  . 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 Smoking status: Never Smoker  . Smokeless tobacco: Never Used  Vaping Use  . Vaping Use: Never used  Substance and Sexual Activity  . Alcohol use: Yes    Alcohol/week: 0.0 standard drinks    Comment: 2 drink before dinner vodka   . Drug use: No  . Sexual activity: Never  Other Topics Concern  . Not on file  Social History Narrative   Pt is married and lives with wife (suzanne). Patient finished  two years of college.    Pt has children.   Caffeine -two cups daily.   Left handed.   Retired Licensed conveyancer                    Social Determinants of Radio broadcast assistant Strain: Not on file  Food Insecurity: Not on file  Transportation Needs: Not on file  Physical Activity: Not on file  Stress: Not on file  Social Connections: Not on file    Family History  Problem Relation Age of Onset  . Stroke Father        Died, 83  . High blood pressure Father   . Diabetes Father   . Other Mother        Died, 88  . Diabetes Brother        Died, 82  . Diabetes Sister        Living, 61  . Healthy Daughter   . Healthy Son     Review of Systems  Constitutional: Negative for chills and fever.  Skin: Positive for color change (swelling and bruising posterior hand) and wound.  Neurological: Negative for numbness (no right hand).       Objective:   Vitals:   11/06/20 1504  BP: 140/78  Pulse: 63  Temp: 98.3 F (36.8 C)  SpO2: 98%   BP Readings from Last 3 Encounters:  11/06/20 140/78  08/21/20 124/70  07/10/20 140/60   Wt Readings from Last  3 Encounters:  11/06/20 159 lb (72.1 kg)  08/21/20 165 lb (74.8 kg)  07/10/20 164 lb 3.2 oz (74.5 kg)   Body  mass index is 25.66 kg/m.   Physical Exam Constitutional:      General: He is not in acute distress.    Appearance: Normal appearance. He is not ill-appearing.  HENT:     Head: Normocephalic and atraumatic.  Skin:    General: Skin is warm and dry.     Findings: Bruising (right posterior hand with swelling - non tender) present.     Comments: Wound right posterior base of fifth finger with slight oozing, no pus.  No swelling around the wound  Neurological:     Mental Status: He is alert.        Patient verbally consented to the use of Dermabond to close the wound.  He tolerated procedure well.    Assessment & Plan:    I spent 20 minutes dedicated to the care of this patient on the date of this encounter including review of recent labs, imaging and procedures, speciality notes, obtaining history, communicating with the patient, ordering medications, tests,cleaning wound, closing and bandaging wound and documenting clinical information in the EHR     See Problem List for Assessment and Plan of chronic medical problems.    This visit occurred during the SARS-CoV-2 public health emergency.  Safety protocols were in place, including screening questions prior to the visit, additional usage of staff PPE, and extensive cleaning of exam room while observing appropriate contact time as indicated for disinfecting solutions.

## 2020-11-13 ENCOUNTER — Ambulatory Visit: Payer: Medicare Other | Admitting: Internal Medicine

## 2020-11-23 ENCOUNTER — Telehealth: Payer: Self-pay | Admitting: Internal Medicine

## 2020-11-23 NOTE — Progress Notes (Signed)
  Chronic Care Management   Outreach Note  11/23/2020 Name: Paul Mays MRN: [id number] DOB: Dec 13, 1927  Referred by: Binnie Rail, MD Reason for referral : No chief complaint on file.   Third unsuccessful telephone outreach was attempted today. The patient was referred to the pharmacist for assistance with care management and care coordination.   Follow Up Plan:   Carley Perdue [facility] Scheduler

## 2020-11-27 ENCOUNTER — Telehealth: Payer: Self-pay | Admitting: Internal Medicine

## 2020-11-27 NOTE — Progress Notes (Signed)
  Chronic Care Management   Note  11/27/2020 Name: Paul Mays MRN: [id number] DOB: November 07, 1927  Paul Mays is a 87 y.o. year old male who is a primary care patient of Burns, Claudina Lick, MD. I reached out to [name] by phone today in response to a referral sent by Paul Mays's PCP, Binnie Rail, MD.   Paul Mays was given information about Chronic Care Management services today including:  1. CCM service includes personalized support from designated clinical staff supervised by his physician, including individualized plan of care and coordination with other care providers 2. 24/7 contact phone numbers for assistance for urgent and routine care needs. 3. Service will only be billed when office clinical staff spend 20 minutes or more in a month to coordinate care. 4. Only one practitioner may furnish and bill the service in a calendar month. 5. The patient may stop CCM services at any time (effective at the end of the month) by phone call to the office staff.   Patient agreed to services and verbal consent obtained.   Follow up plan:   Carley Perdue [facility] Scheduler

## 2020-12-20 ENCOUNTER — Telehealth: Payer: Self-pay | Admitting: Internal Medicine

## 2020-12-20 NOTE — Telephone Encounter (Signed)
LVM for pt to rtn my call to schedule awv with nha. Please schedule appt if pt calls the office.  

## 2020-12-24 ENCOUNTER — Ambulatory Visit: Payer: Medicare Other | Admitting: Cardiovascular Disease

## 2021-01-03 ENCOUNTER — Telehealth: Payer: Self-pay | Admitting: Internal Medicine

## 2021-01-03 DIAGNOSIS — M25512 Pain in left shoulder: Secondary | ICD-10-CM

## 2021-01-03 DIAGNOSIS — G8929 Other chronic pain: Secondary | ICD-10-CM

## 2021-01-03 NOTE — Telephone Encounter (Signed)
Patient requesting PT for both of his shoulders. He said he would like it to be out patient. He can be reached at [phone]. Please advise

## 2021-01-09 ENCOUNTER — Telehealth: Payer: Self-pay

## 2021-01-09 ENCOUNTER — Other Ambulatory Visit: Payer: Self-pay | Admitting: Internal Medicine

## 2021-01-09 NOTE — Progress Notes (Signed)
    Chronic Care Management Pharmacy Assistant   Name: Paul Mays  MRN: [id number] DOB: 07-Nov-1927   Reason for Encounter: Initial Questions   Recent office visits:  11/06/2020 Paul Mays  Recent consult visits:  None noted  Hospital visits:  None in previous 6 months  Medications: Outpatient Encounter Medications as of 01/09/2021  Medication Sig  . allopurinol (ZYLOPRIM) 300 MG tablet TAKE 1 TABLET(300 MG) BY MOUTH DAILY AS NEEDED FOR GOUT FLARE  . aspirin EC 81 MG tablet Take 1 tablet (81 mg total) by mouth daily.  . Cholecalciferol (VITAMIN D3) 2000 UNITS capsule Take 1 capsule (2,000 Units total) by mouth daily.  . Flavocoxid (LIMBREL) 500 MG CAPS Take 500 mg by mouth daily.  . furosemide (LASIX) 20 MG tablet Take 1 tablet (20 mg total) by mouth daily.  Marland Kitchen gabapentin (NEURONTIN) 600 MG tablet Take 1 tablet (600 mg total) by mouth 2 (two) times daily.  Marland Kitchen losartan (COZAAR) 50 MG tablet Take 1 tablet (50 mg total) by mouth daily.  . metoprolol (LOPRESSOR) 50 MG tablet Take 25 mg by mouth 2 (two) times daily. 1/2 tab by mouth two times a day.   . neomycin-polymyxin b-dexamethasone (MAXITROL) 3.5-10000-0.1 OINT SMARTSIG:0.25 Strip(s) In Eye(s) Twice Daily  . omeprazole (PRILOSEC) 20 MG capsule TAKE 2 CAPSULES DAILY FOR 1 MONTH, THEN 1 CAPSULE DAILY  . pravastatin (PRAVACHOL) 40 MG tablet Take 40 mg by mouth daily.    Marland Kitchen terazosin (HYTRIN) 10 MG capsule Take 12 mg by mouth at bedtime.  . traMADol (ULTRAM) 50 MG tablet Take 1 tablet (50 mg total) by mouth every 12 (twelve) hours as needed for moderate pain (for chronic shoulder pain).   No facility-administered encounter medications on file as of 01/09/2021.   Have you seen any other providers since your last visit?  Patient stated he hasn't seen any other providers.   Any changes in your medications or health?  Patient stated no changes at this time.  Any side effects from any medications? Patient stated no side  effects.  Do you have an symptoms or problems not managed by your medications? Patient stated not at this time.  Any concerns about your health right now? Patient states that both of his shoulders hurt all the time.   Has your provider asked that you check blood pressure, blood sugar, or follow special diet at home?  Patient states he has a blood pressure machine but does not check his blood pressure.  Do you get any type of exercise on a regular basis? Patient stated he gets his exercise in by walking in the grocery stores, walking to the mailbox and around the house.   Can you think of a goal you would like to reach for your health?  Patient states he gets along pretty good for his age.  Do you have any problems getting your medications?  Patient stated no problems getting medications.  Is there anything that you would like to discuss during the appointment?  Patient has no concerns at this time.  Patient would like to know about [facility]. Patient stated he would like to know more about [facility].  Please bring medications and supplements to appointment   Star Rating Drugs: Losartan 11/09/20 90D Pravastatin   Paul Mays, RMA Clinical Pharmacists Assistant [phone]  Time Spent: 26 mins

## 2021-01-10 ENCOUNTER — Ambulatory Visit (INDEPENDENT_AMBULATORY_CARE_PROVIDER_SITE_OTHER): Payer: Medicare Other | Admitting: Pharmacist

## 2021-01-10 ENCOUNTER — Other Ambulatory Visit: Payer: Self-pay

## 2021-01-10 DIAGNOSIS — I2510 Atherosclerotic heart disease of native coronary artery without angina pectoris: Secondary | ICD-10-CM | POA: Diagnosis not present

## 2021-01-10 DIAGNOSIS — E7849 Other hyperlipidemia: Secondary | ICD-10-CM

## 2021-01-10 DIAGNOSIS — N1831 Chronic kidney disease, stage 3a: Secondary | ICD-10-CM

## 2021-01-10 DIAGNOSIS — M109 Gout, unspecified: Secondary | ICD-10-CM

## 2021-01-10 DIAGNOSIS — I10 Essential (primary) hypertension: Secondary | ICD-10-CM

## 2021-01-10 DIAGNOSIS — K219 Gastro-esophageal reflux disease without esophagitis: Secondary | ICD-10-CM

## 2021-01-10 NOTE — Progress Notes (Signed)
Chronic Care Management Pharmacy Note  01/10/2021 Name:  Paul Mays MRN:  [id number] DOB:  December 02, 1927  Subjective: Paul Mays is an 87 y.o. year old male who is a primary patient of Burns, Claudina Lick, MD.  The CCM team was consulted for assistance with disease management and care coordination needs.    Engaged with patient by telephone for initial visit in response to provider referral for pharmacy case management and/or care coordination services.   Consent to Services:  The patient was given the following information about Chronic Care Management services today, agreed to services, and gave verbal consent: 1. CCM service includes personalized support from designated clinical staff supervised by the primary care provider, including individualized plan of care and coordination with other care providers 2. 24/7 contact phone numbers for assistance for urgent and routine care needs. 3. Service will only be billed when office clinical staff spend 20 minutes or more in a month to coordinate care. 4. Only one practitioner may furnish and bill the service in a calendar month. 5.The patient may stop CCM services at any time (effective at the end of the month) by phone call to the office staff. 6. The patient will be responsible for cost sharing (co-pay) of up to 20% of the service fee (after annual deductible is met). Patient agreed to services and consent obtained.  Patient Care Team: Binnie Rail, MD as PCP - General (Internal Medicine) Sherren Mocha, MD as PCP - Cardiology (Cardiology) Charlton Haws, [facility] as Pharmacist (Pharmacist)   Patient lives at home with wife who has dementia. He takes care of his medications and hers.  Recent office visits: 11/06/20 Dr Quay Burow OV: acute visit for hand pain after fall. Dermabond applied.   08/21/20 Dr Quay Burow OV: acute visit for fall w/ bump on head. No need for imaging since 2 weeks ago  Recent consult visits: 10/02/20 Dr Ronnald Ramp (dermatology):  f/u seborrheic dermatitis  [facility]  [facility] visits: None in previous 6 months  Objective:  Lab Results  Component Value Date   CREATININE 1.25 (H) 06/25/2020   BUN 21 06/25/2020   GFR 46.81 (L) 02/28/2020   GFRNONAA 50 (L) 06/25/2020   GFRAA 58 (L) 06/25/2020   NA 142 06/25/2020   K 4.9 06/25/2020   CALCIUM 8.8 06/25/2020   CO2 26 06/25/2020   GLUCOSE 104 (H) 06/25/2020    Lab Results  Component Value Date/Time   HGBA1C 5.1 09/24/2015 11:08 AM   HGBA1C 5.3 01/27/2011 04:55 PM   GFR 46.81 (L) 02/28/2020 03:06 PM   GFR 42.00 (L) 02/09/2020 11:57 AM    Last diabetic Eye exam: No results found for: HMDIABEYEEXA  Last diabetic Foot exam: No results found for: HMDIABFOOTEX   Lab Results  Component Value Date   CHOL 149 12/09/2017   HDL 72.30 12/09/2017   LDLCALC 65 12/09/2017   TRIG 57.0 12/09/2017   CHOLHDL 2 12/09/2017    Hepatic Function Latest Ref Rng & Units 06/25/2020 03/19/2018 12/09/2017  Total Protein 6.1 - 8.1 g/dL 5.4(L) 6.1 6.3  Albumin 3.5 - 5.2 g/dL - 4.0 4.0  AST 10 - 35 U/L _0 ALT 9 - 46 U/L _1 Alk Phosphatase 39 - 117 U/L - 27(L) 27(L)  Total Bilirubin 0.2 - 1.2 mg/dL 1.5(H) 1.8(H) 1.5(H)  Bilirubin, Direct 0.0 - 0.3 mg/dL - - -    Lab Results  Component Value Date/Time   TSH 1.97 09/24/2015 11:08 AM  TSH 1.93 07/19/2013 03:06 PM    CBC Latest Ref Rng & Units 06/25/2020 03/19/2018 12/09/2017  WBC 3.8 - 10.8 Thousand/uL 5.1 5.1 5.3  Hemoglobin 13.2 - 17.1 g/dL 12.1(L) 13.6 14.0  Hematocrit 38.5 - 50.0 % 36.7(L) 39.8 41.6  Platelets 140 - 400 Thousand/uL 121(L) 101.0(L) 116.0(L)    No results found for: VD25OH  Clinical ASCVD: Yes  The ASCVD Risk score Mikey Bussing DC Jr., et al., 2013) failed to calculate for the following reasons:   The 2013 ASCVD risk score is only valid for ages 69 to 62    Depression screen PHQ 2/9 08/21/2020 08/17/2019 02/11/2019  Decreased Interest 0 0 0  Down, Depressed, Hopeless 0 0 0  PHQ - 2  Score 0 0 0  Altered sleeping - - -  Tired, decreased energy - - -  Change in appetite - - -  Feeling bad or failure about yourself  - - -  Trouble concentrating - - -  Moving slowly or fidgety/restless - - -  Suicidal thoughts - - -  PHQ-9 Score - - -  Difficult doing work/chores - - -     Social History   Tobacco Use  Smoking Status Never Smoker  Smokeless Tobacco Never Used   BP Readings from Last 3 Encounters:  11/06/20 140/78  08/21/20 124/70  07/10/20 140/60   Pulse Readings from Last 3 Encounters:  11/06/20 63  08/21/20 (!) 58  07/10/20 (!) 55   Wt Readings from Last 3 Encounters:  11/06/20 159 lb (72.1 kg)  08/21/20 165 lb (74.8 kg)  07/10/20 164 lb 3.2 oz (74.5 kg)   BMI Readings from Last 3 Encounters:  11/06/20 25.66 kg/m  08/21/20 26.63 kg/m  07/10/20 26.50 kg/m    Assessment/Interventions: Review of patient past medical history, allergies, medications, health status, including review of consultants reports, laboratory and other test data, was performed as part of comprehensive evaluation and provision of chronic care management services.   SDOH:  (Social Determinants of Health) assessments and interventions performed: Yes SDOH Interventions   Flowsheet Row Most Recent Value  SDOH Interventions   Financial Strain Interventions Intervention Not Indicated     SDOH Screenings   Alcohol Screen: Not on file  Depression (PHQ2-9): Low Risk   . PHQ-2 Score: 0  Financial Resource Strain: Low Risk   . Difficulty of Paying Living Expenses: Not hard at all  Food Insecurity: Not on file  Housing: Not on file  Physical Activity: Not on file  Social Connections: Not on file  Stress: Not on file  Tobacco Use: Low Risk   . Smoking Tobacco Use: Never Smoker  . Smokeless Tobacco Use: Never Used  Transportation Needs: Not on file    CCM Care Plan  Allergies  Allergen Reactions  . Lisinopril Other (See Comments)    Hyperkalemia in the context of  combined spironolactone and lisinopril therapy  . Spironolactone Other (See Comments)    Hyperkalemia in context of spironolactone and lisinopril therapy    Medications Reviewed Today    Reviewed by Charlton Haws, [facility] (Pharmacist) on 01/10/21 at 1204  Med List Status: <None>  Medication Order Taking? Sig Documenting Provider Last Dose Status Informant  allopurinol (ZYLOPRIM) 300 MG tablet [id number] No TAKE 1 TABLET(300 MG) BY MOUTH DAILY AS NEEDED FOR GOUT FLARE  Patient not taking: Reported on 01/10/2021   Binnie Rail, MD Not Taking Active   aspirin EC 81 MG tablet [id number] Yes Take 1 tablet (81 mg total)  by mouth daily. Darreld Mclean, PA-C Taking Active   furosemide (LASIX) 20 MG tablet [id number] No Take 1 tablet (20 mg total) by mouth daily.  Patient not taking: Reported on 01/10/2021   Binnie Rail, MD Not Taking Active   gabapentin (NEURONTIN) 600 MG tablet [id number] Yes Take 1 tablet (600 mg total) by mouth 2 (two) times daily. Binnie Rail, MD Taking Active   losartan (COZAAR) 50 MG tablet [id number] Yes Take 1 tablet (50 mg total) by mouth daily. Binnie Rail, MD Taking Active   metoprolol (LOPRESSOR) 50 MG tablet [id number] Yes Take 25 mg by mouth 2 (two) times daily. 1/2 tab by mouth two times a day. [provider] Taking Active   omeprazole (PRILOSEC) 20 MG capsule [id number] No TAKE 2 CAPSULES DAILY FOR 1 MONTH, THEN 1 CAPSULE DAILY  Patient not taking: Reported on 01/10/2021   Binnie Rail, MD Not Taking Active   pravastatin (PRAVACHOL) 40 MG tablet [id number] Yes Take 40 mg by mouth daily. [provider] Taking Active   terazosin (HYTRIN) 10 MG capsule [id number] Yes Take 12 mg by mouth at bedtime. [provider] Taking Active   traMADol (ULTRAM) 50 MG tablet [id number] Yes Take 1 tablet (50 mg total) by mouth every 12 (twelve) hours as needed for moderate pain (for chronic shoulder pain). Binnie Rail, MD Taking Active            Patient Active Problem List   Diagnosis Date Noted  . Hand laceration 11/06/2020  . Fall 08/21/2020  . Head trauma, initial encounter 08/21/2020  . Bilateral leg edema 01/26/2020  . GERD (gastroesophageal reflux disease) 01/26/2020  . Chronic pain of both shoulders 12/10/2018  . Easy bruising 03/20/2018  . Decreased hearing of right ear 09/04/2017  . Rotator cuff tear arthropathy of both shoulders 10/18/2015  . Thrombocytopenia ([place]) 09/24/2015  . Diverticulosis of colon without hemorrhage 07/02/2014  . Hypertrophic cardiomyopathy ([place]) 03/24/2011  . OBSTRUCTIVE SLEEP APNEA 11/08/2010  . HYPERTENSION, PULMONARY 09/16/2010  . HEARING LOSS, BILATERAL 06/18/2009  . Coronary atherosclerosis 03/07/2009  . Aortic valve disorder, mod AR, mod AS 03/07/2009  . CAROTID STENOSIS 03/07/2009  . Hyperlipidemia 11/15/2008  . Peripheral neuropathy, idiopathic 11/15/2008  . Essential hypertension 11/15/2008  . ARTHRALGIA 11/15/2008  . COLITIS, HX OF 11/15/2008  . VENOUS INSUFFICIENCY 04/20/2007  . Gout 02/10/2007    Immunization History  Administered Date(s) Administered  . Fluad Quad(high Dose 65+) 06/25/2020  . Influenza, High Dose Seasonal PF 06/30/2014, 07/04/2017, 06/11/2018, 06/22/2019  . Influenza,inj,Quad PF,6+ Mos 07/04/2017  . Influenza-Unspecified 07/06/2013, 06/07/2015, 07/08/2016  . PFIZER(Purple Top)SARS-COV-2 Vaccination 10/22/2019, 11/11/2019  . Pneumococcal Conjugate-13 06/07/2015  . Pneumococcal Polysaccharide-23 12/05/2013, 07/08/2016  . Td 02/10/2015    Conditions to be addressed/monitored:  Hypertension, Hyperlipidemia, Coronary Artery Disease, Chronic Kidney Disease, BPH and Gout  Care Plan : CCM Pharmacy Care Plan  Updates made by Charlton Haws, [place] since 01/10/2021 12:00 AM    Problem: Hypertension, Hyperlipidemia, Coronary Artery Disease, Chronic Kidney Disease, BPH and Gout   Priority: High    Long-Range Goal: Disease management   Start Date:  01/10/2021  Expected End Date: 01/10/2022  This Visit's Progress: On track  Priority: High  Note:   Current Barriers:  . Unable to independently monitor therapeutic efficacy  Pharmacist Clinical Goal(s):  Marland Kitchen Patient will achieve adherence to monitoring guidelines and medication adherence to achieve therapeutic efficacy through collaboration with PharmD and provider.   Interventions: . 1:1 collaboration  with Binnie Rail, MD regarding development and update of comprehensive plan of care as evidenced by provider attestation and co-signature . Inter-disciplinary care team collaboration (see longitudinal plan of care) . Comprehensive medication review performed; medication list updated in electronic medical record  Hypertension (BP goal <140/90) -Hx Pulmonary HTN, hypertrophic cardiomyopathy -Controlled -Current treatment: . Losartan 50 mg daily  . Metoprolol tartrate 50 mg - 1/2 tab BID - [facility] . Terazosin 2 mg HS - [facility] . Furosemide 20 mg PRN -Medications previously tried: n/a -Current home readings: 140/78 -Denies hypotensive/hypertensive symptoms -Educated on BP goals and benefits of medications for prevention of heart attack, stroke and kidney damage; -Counseled on indication for furosemide and when to use for excess swelling -Recommended to continue current medication  Hyperlipidemia: (LDL goal < 100) -Hx coronary atherosclerosis, carotid stenosis, venous insufficiency -Controlled - LD at goal -Current treatment: . Pravastatin 40 mg daily - [facility] . Aspirin 81 mg daily -Educated on Cholesterol goals;  Benefits of statin for ASCVD risk reduction; -Recommended to continue current medication  GERD (Goal: manage symptoms) -Controlled - pt is not taking PPI regularly and denies issues with hearburn/reflux -Current treatment  . Omeprazole 20 mg daily - not taking -Counseled on using omeprazole as needed for heartburn/reflux symptoms  Pain (Goal: manage symptoms) -osteoarthritis,  neuropathy -Controlled - pt is not using tramadol, was not sure what it was for -Current treatment  . Gabapentin 600 mg BID - [facility] . Tramadol 50 mg q12h PRN - NF -Counseled on indication and use of tramadol for shoulder pain -Recommended to continue current medication  Gout (Goal: maintain uric acid < 6) -Controlled - pt is not taking allopurinol, he has not had gout flare in years and uric acid is below goal -Current treatment  . Allopurinol 300 mg daily PRN -Recommended not to restart allopurinol  Health Maintenance -Vaccine gaps: covid vaccine, shingrix -Current therapy:  Marland Kitchen Vitamin B12  -Patient is satisfied with current therapy and denies issues -Recommended to continue current medication  Patient Goals/Self-Care Activities . Patient will:  - take medications as prescribed focus on medication adherence by routine -[facility] -Take tramadol as needed for shoulder pain -Use omeprazole as needed for heartburn / reflux -Use furosemide as needed for increased swelling in feet/ankles  Follow Up Plan: Telephone follow up appointment with care management team member scheduled for: 6 months      Medication Assistance: None required.  Patient affirms current coverage meets needs.  Patient's preferred pharmacy is: [facility] [phone] - [place], [place] - [address] [place] [address] Phone: [phone] Fax: [phone]  Uses pill box? No - prefers bottles Pt endorses 100% compliance  We discussed: Current pharmacy is preferred with insurance plan and patient is satisfied with pharmacy services Patient decided to: Continue current medication management strategy  Care Plan and Follow Up Patient Decision:  Patient agrees to Care Plan and Follow-up.  Plan: Telephone follow up appointment with care management team member scheduled for:  6  months  Charlene Brooke, PharmD, [place], CPP Clinical Pharmacist [facility] [phone]

## 2021-01-10 NOTE — Patient Instructions (Addendum)
Visit Information  Phone number for Pharmacist: [phone]  Thank you for meeting with me to discuss your medications! I look forward to working with you to achieve your health care goals. Below is a summary of what we talked about during the visit:  Goals Addressed            This Visit's Progress   . Manage My Medicine       Timeframe:  Long-Range Goal Priority:  Medium Start Date:      01/10/21                       Expected End Date:     01/10/22                  Follow Up Date 07/20/21   - call for medicine refill 2 or 3 days before it runs out - call if I am sick and can't take my medicine - keep a list of all the medicines I take; vitamins and herbals too  -[facility] -Take tramadol as needed for shoulder pain -Use omeprazole as needed for heartburn / reflux -Use furosemide as needed for increased swelling in feet/ankles   Why is this important?   . These steps will help you keep on track with your medicines.   Notes:       Patient Care Plan: CCM Pharmacy Care Plan    Problem Identified: Hypertension, Hyperlipidemia, Coronary Artery Disease, Chronic Kidney Disease, BPH and Gout   Priority: High    Long-Range Goal: Disease management   Start Date: 01/10/2021  Expected End Date: 01/10/2022  This Visit's Progress: On track  Priority: High  Note:   Current Barriers:  . Unable to independently monitor therapeutic efficacy  Pharmacist Clinical Goal(s):  Marland Kitchen Patient will achieve adherence to monitoring guidelines and medication adherence to achieve therapeutic efficacy through collaboration with PharmD and provider.   Interventions: . 1:1 collaboration with Binnie Rail, MD regarding development and update of comprehensive plan of care as evidenced by provider attestation and co-signature . Inter-disciplinary care team collaboration (see longitudinal plan of care) . Comprehensive medication review performed; medication list  updated in electronic medical record  Hypertension (BP goal <140/90) -Hx Pulmonary HTN, hypertrophic cardiomyopathy -Controlled -Current treatment: . Losartan 50 mg daily  . Metoprolol tartrate 50 mg - 1/2 tab BID [facility] . Terazosin 2 mg HS [facility] . Furosemide 20 mg PRN -Medications previously tried: n/a -Current home readings: 140/78 -Denies hypotensive/hypertensive symptoms -Educated on BP goals and benefits of medications for prevention of heart attack, stroke and kidney damage; -Counseled on indication for furosemide and when to use for excess swelling -Recommended to continue current medication  Hyperlipidemia: (LDL goal < 100) -Hx coronary atherosclerosis, carotid stenosis, venous insufficiency -Controlled - LD at goal -Current treatment: . Pravastatin 40 mg daily - [facility] . Aspirin 81 mg daily -Educated on Cholesterol goals;  Benefits of statin for ASCVD risk reduction; -Recommended to continue current medication  GERD (Goal: manage symptoms) -Controlled - pt is not taking PPI regularly and denies issues with hearburn/reflux -Current treatment  . Omeprazole 20 mg daily - not taking -Counseled on using omeprazole as needed for heartburn/reflux symptoms  Pain (Goal: manage symptoms) -osteoarthritis, neuropathy -Controlled - pt is not using tramadol, was not sure what it was for -Current treatment  . Gabapentin 600 mg BID - [facility] . Tramadol 50 mg q12h PRN - [facility] -Counseled on indication  and use of tramadol for shoulder pain -Recommended to continue current medication  Gout (Goal: maintain uric acid < 6) -Controlled - pt is not taking allopurinol, he has not had gout flare in years and uric acid is below goal -Current treatment  . Allopurinol 300 mg daily PRN -Recommended not to restart allopurinol  Health Maintenance -Vaccine gaps: covid vaccine, shingrix -Current therapy:  Marland Kitchen Vitamin B12  -Patient is satisfied with current therapy and denies issues -Recommended to continue  current medication  Patient Goals/Self-Care Activities . Patient will:  - take medications as prescribed focus on medication adherence by routine -[facility] -Take tramadol as needed for shoulder pain -Use omeprazole as needed for heartburn / reflux -Use furosemide as needed for increased swelling in feet/ankles  Follow Up Plan: Telephone follow up appointment with care management team member scheduled for: 6 months      Mr. Morejon was given information about Chronic Care Management services today including:  1. CCM service includes personalized support from designated clinical staff supervised by his physician, including individualized plan of care and coordination with other care providers 2. 24/7 contact phone numbers for assistance for urgent and routine care needs. 3. Standard insurance, coinsurance, copays and deductibles apply for chronic care management only during months in which we provide at least 20 minutes of these services. Most insurances cover these services at 100%, however patients may be responsible for any copay, coinsurance and/or deductible if applicable. This service may help you avoid the need for more expensive face-to-face services. 4. Only one practitioner may furnish and bill the service in a calendar month. 5. The patient may stop CCM services at any time (effective at the end of the month) by phone call to the office staff.  Patient agreed to services and verbal consent obtained.   The patient verbalized understanding of instructions, educational materials, and care plan provided today and declined offer to receive copy of patient instructions, educational materials, and care plan.  Telephone follow up appointment with pharmacy team member scheduled for: 6 months  Charlene Brooke, PharmD, BCACP, CPP Clinical Pharmacist [facility] After Age 75 After age 29,  you are at a higher risk for certain long-term diseases and infections as well as injuries from falls. Falls are a major cause of broken bones and head injuries in people who are older than age 48. Getting regular preventive care can help to keep you healthy and well. Preventive care includes getting regular testing and making lifestyle changes as recommended by your health care provider. Talk with your health care provider about:  Which screenings and tests you should have. A screening is a test that checks for a disease when you have no symptoms.  A diet and exercise plan that is right for you. What should I know about screenings and tests to prevent falls? Screening and testing are the best ways to find a health problem early. Early diagnosis and treatment give you the best chance of managing medical conditions that are common after age 72. Certain conditions and lifestyle choices may make you more likely to have a fall. Your health care provider may recommend:  Regular vision checks. Poor vision and conditions such as cataracts can make you more likely to have a fall. If you wear glasses, make sure to get your prescription updated if your vision changes.  Medicine review. Work with your health care provider to regularly review all  of the medicines you are taking, including over-the-counter medicines. Ask your health care provider about any side effects that may make you more likely to have a fall. Tell your health care provider if any medicines that you take make you feel dizzy or sleepy.  Osteoporosis screening. Osteoporosis is a condition that causes the bones to get weaker. This can make the bones weak and cause them to break more easily.  Blood pressure screening. Blood pressure changes and medicines to control blood pressure can make you feel dizzy.  Strength and balance checks. Your health care provider may recommend certain tests to check your strength and balance while standing, walking,  or changing positions.  Foot health exam. Foot pain and numbness, as well as not wearing proper footwear, can make you more likely to have a fall.  Depression screening. You may be more likely to have a fall if you have a fear of falling, feel emotionally low, or feel unable to do activities that you used to do.  Alcohol use screening. Using too much alcohol can affect your balance and may make you more likely to have a fall. What actions can I take to lower my risk of falls? General instructions  Talk with your health care provider about your risks for falling. Tell your health care provider if: ? You fall. Be sure to tell your health care provider about all falls, even ones that seem minor. ? You feel dizzy, sleepy, or off-balance.  Take over-the-counter and prescription medicines only as told by your health care provider. These include any supplements.  Eat a healthy diet and maintain a healthy weight. A healthy diet includes low-fat dairy products, low-fat (lean) meats, and fiber from whole grains, beans, and lots of fruits and vegetables. Home safety  Remove any tripping hazards, such as rugs, cords, and clutter.  Install safety equipment such as grab bars in bathrooms and safety rails on stairs.  Keep rooms and walkways well-lit. Activity  Follow a regular exercise program to stay fit. This will help you maintain your balance. Ask your health care provider what types of exercise are appropriate for you.  If you need a cane or walker, use it as recommended by your health care provider.  Wear supportive shoes that have nonskid soles.   Lifestyle  Do not drink alcohol if your health care provider tells you not to drink.  If you drink alcohol, limit how much you have: ? 0-1 drink a day for women. ? 0-2 drinks a day for men.  Be aware of how much alcohol is in your drink. In the [place] one drink equals one typical bottle of beer (12 oz), one-half glass of wine (5 oz), or one  shot of hard liquor (1 oz).  Do not use any products that contain nicotine or tobacco, such as cigarettes and e-cigarettes. If you need help quitting, ask your health care provider. Summary  Having a healthy lifestyle and getting preventive care can help to protect your health and wellness after age 4.  Screening and testing are the best way to find a health problem early and help you avoid having a fall. Early diagnosis and treatment give you the best chance for managing medical conditions that are more common for people who are older than age 37.  Falls are a major cause of broken bones and head injuries in people who are older than age 41. Take precautions to prevent a fall at home.  Work with your health care provider  to learn what changes you can make to improve your health and wellness and to prevent falls. This information is not intended to replace advice given to you by your health care provider. Make sure you discuss any questions you have with your health care provider. Document Revised: 01/13/2019 Document Reviewed: 08/05/2017 Elsevier Patient Education  2021 [employer].

## 2021-01-15 ENCOUNTER — Ambulatory Visit: Payer: Medicare Other

## 2021-01-16 ENCOUNTER — Ambulatory Visit (INDEPENDENT_AMBULATORY_CARE_PROVIDER_SITE_OTHER): Payer: Medicare Other

## 2021-01-16 ENCOUNTER — Other Ambulatory Visit: Payer: Self-pay

## 2021-01-16 ENCOUNTER — Telehealth: Payer: Self-pay | Admitting: Pharmacist

## 2021-01-16 VITALS — BP 120/70 | HR 62 | Temp 98.3°F | Ht 66.0 in | Wt 155.4 lb

## 2021-01-16 DIAGNOSIS — Z0000 Encounter for general adult medical examination without abnormal findings: Secondary | ICD-10-CM

## 2021-01-16 NOTE — Telephone Encounter (Signed)
Received message from [facility] that patient is complaining of lightheadedness, dizziness in the mornings and lately extending into the afternoon. His BP today was 120/70 during his AWV and he reports it is never that low - last office visit BP was 140/78.   Called patient to discuss options. He reports oral intake is consistent and hasn't changed. He is not able to check his own BP at home because of shoulder arthritis making it too difficult to put the cuff on. His wife is unable to help him either due to dementia.   Reviewed medications. Terazosin is most likely to have issues with orthostasis, so advised he hold terazosin for a while and monitor symptoms. We are looking for improvement in lightheadedness/dizzines. Counseled patient that this med also effects prostate so advised him to monitor urinary frequency as well.   Patient voiced understanding of above. Pt will call office with issues. Pharmacist will follow up via phone in 2-3 weeks.

## 2021-01-16 NOTE — Progress Notes (Signed)
Subjective:   Paul Mays is a 87 y.o. male who presents for Medicare Annual/Subsequent preventive examination.  Review of Systems    No ROS. Medicare Wellness Visit. Additional risk factors are reflected in social history. Cardiac Risk Factors include: advanced age (>40men, >83 women);dyslipidemia;hypertension;male gender;family history of premature cardiovascular disease     Objective:    Today's Vitals   01/16/21 1453  BP: 120/70  Pulse: 62  Temp: 98.3 F (36.8 C)  SpO2: 91%  Weight: 155 lb 6.4 oz (70.5 kg)  Height: 5\' 6"  (1.676 m)  PainSc: 0-No pain   Body mass index is 25.08 kg/m.  Advanced Directives 01/16/2021 08/17/2019 06/11/2018 11/07/2015  Does Patient Have a Medical Advance Directive? Yes Yes Yes Yes  Type of Advance Directive - [place];Living will [place];Living will -  Does patient want to make changes to medical advance directive? No - Patient declined - - -  Copy of [place] in Chart? - No - copy requested Yes Yes    Current Medications (verified) Outpatient Encounter Medications as of 01/16/2021  Medication Sig  . allopurinol (ZYLOPRIM) 300 MG tablet TAKE 1 TABLET(300 MG) BY MOUTH DAILY AS NEEDED FOR GOUT FLARE (Patient not taking: Reported on 01/10/2021)  . aspirin EC 81 MG tablet Take 1 tablet (81 mg total) by mouth daily.  . furosemide (LASIX) 20 MG tablet Take 1 tablet (20 mg total) by mouth daily. (Patient not taking: Reported on 01/10/2021)  . gabapentin (NEURONTIN) 600 MG tablet Take 1 tablet (600 mg total) by mouth 2 (two) times daily.  Marland Kitchen losartan (COZAAR) 50 MG tablet Take 1 tablet (50 mg total) by mouth daily.  . metoprolol (LOPRESSOR) 50 MG tablet Take 25 mg by mouth 2 (two) times daily. 1/2 tab by mouth two times a day.  Marland Kitchen omeprazole (PRILOSEC) 20 MG capsule TAKE 2 CAPSULES DAILY FOR 1 MONTH, THEN 1 CAPSULE DAILY (Patient not taking: Reported on 01/10/2021)  . pravastatin (PRAVACHOL) 40  MG tablet Take 40 mg by mouth daily.  Marland Kitchen terazosin (HYTRIN) 10 MG capsule Take 12 mg by mouth at bedtime.  . traMADol (ULTRAM) 50 MG tablet Take 1 tablet (50 mg total) by mouth every 12 (twelve) hours as needed for moderate pain (for chronic shoulder pain).   No facility-administered encounter medications on file as of 01/16/2021.    Allergies (verified) Lisinopril and Spironolactone   History: Past Medical History:  Diagnosis Date  . Anxiety   . Aortic stenosis   . Arthralgia   . Arthritis   . CAD (coronary artery disease)   . Carotid stenosis   . Cervicalgia   . CLUSTER HEADACHE 02/10/2007   Qualifier: Diagnosis of  By: Linna Darner MD, Gwyndolyn Saxon    . Colitis   . Edema   . Gout   . Hearing loss   . Herpes zoster without mention of complication   . Hyperlipidemia   . Hypertension   . Hyperuricemia   . Impacted cerumen   . Migraine   . Migraine   . Murmur   . Olecranon bursitis   . OSA (obstructive sleep apnea)   . Other and unspecified hyperlipidemia   . Other malaise and fatigue   . Peripheral neuropathy   . Polyneuropathy   . Pulmonary hypertension ([place])   . Unspecified hereditary and idiopathic peripheral neuropathy   . Venous insufficiency    Past Surgical History:  Procedure Laterality Date  . ANGIOPLASTY  w/ 2 stents in 1999  . APPENDECTOMY    . HEMORRHOID SURGERY    . LIPOMA EXCISION    . TONSILLECTOMY     Family History  Problem Relation Age of Onset  . Stroke Father        Died, 83  . High blood pressure Father   . Diabetes Father   . Other Mother        Died, 88  . Diabetes Brother        Died, 82  . Diabetes Sister        Living, 18  . Healthy Daughter   . Healthy Son    Social History   Socioeconomic History  . Marital status: Married    Spouse name: [name]  . Number of children: 2  . Years of education: college  . Highest education level: Not on file  Occupational History  . Occupation: retired    Comment: worked as a Acupuncturist: RETIRED  Tobacco Use  . Smoking status: Never Smoker  . Smokeless tobacco: Never Used  Vaping Use  . Vaping Use: Never used  Substance and Sexual Activity  . Alcohol use: Yes    Alcohol/week: 0.0 standard drinks    Comment: 2 drink before dinner vodka   . Drug use: No  . Sexual activity: Never  Other Topics Concern  . Not on file  Social History Narrative   Pt is married and lives with wife (suzanne). Patient finished  two years of college.    Pt has children.   Caffeine -two cups daily.   Left handed.   Retired Licensed conveyancer                    Social Determinants of Radio broadcast assistant Strain: Fidelity   . Difficulty of Paying Living Expenses: Not hard at all  Food Insecurity: No Food Insecurity  . Worried About Charity fundraiser in the Last Year: Never true  . Ran Out of Food in the Last Year: Never true  Transportation Needs: No Transportation Needs  . Lack of Transportation (Medical): No  . Lack of Transportation (Non-Medical): No  Physical Activity: Sufficiently Active  . Days of Exercise per Week: 5 days  . Minutes of Exercise per Session: 30 min  Stress: No Stress Concern Present  . Feeling of Stress : Not at all  Social Connections: Socially Integrated  . Frequency of Communication with Friends and Family: More than three times a week  . Frequency of Social Gatherings with Friends and Family: More than three times a week  . Attends Religious Services: More than 4 times per year  . Active Member of Clubs or Organizations: Yes  . Attends Archivist Meetings: More than 4 times per year  . 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 no  Interpreter Needed?: No  Information entered by :: Lisette Abu, LPN   Activities of  Daily Living In your present state of health, do you have any difficulty performing the following activities: 01/16/2021 08/21/2020  Hearing? N N  Vision? N N  Difficulty concentrating or making decisions? N N  Walking or climbing stairs? Y N  Dressing or bathing? N N  Doing errands, shopping? N N  Preparing Food and eating ? N -  Using the Toilet? N -  In the past six months, have you accidently leaked urine? N -  Do you have problems with loss of bowel control? N -  Managing your Medications? N -  Managing your Finances? N -  Housekeeping or managing your Housekeeping? N -  Some recent data might be hidden    Patient Care Team: Binnie Rail, MD as PCP - General (Internal Medicine) Sherren Mocha, MD as PCP - Cardiology (Cardiology) Charlton Haws, [facility] as Pharmacist (Pharmacist)  Indicate any recent Medical Services you may have received from other than [facility] providers in the past year (date may be approximate).     Assessment:   This is a routine wellness examination for [place].  Hearing/Vision screen No exam data present  Dietary issues and exercise activities discussed: Current Exercise Habits: Home exercise routine, Type of exercise: walking, Time (Minutes): 30, Frequency (Times/Week): 5, Weekly Exercise (Minutes/Week): 150, Intensity: Mild, Exercise limited by: orthopedic condition(s)  Goals    .  Manage My Medicine      Timeframe:  Long-Range Goal Priority:  Medium Start Date:      01/10/21                       Expected End Date:     01/10/22                  Follow Up Date 07/20/21   - call for medicine refill 2 or 3 days before it  runs out - call if I am sick and can't take my medicine - keep a list of all the medicines I take; vitamins and herbals too  -[facility] -Take tramadol as needed for shoulder pain -Use omeprazole as needed for heartburn / reflux -Use furosemide as needed for increased swelling in feet/ankles   Why is this important?   . These steps will help you keep on track with your medicines.   Notes:     .  patient  (pt-stated)      Try to maintain health; continue to walk dog;      .  Patient Stated      Stay as healthy and as independent as possible      Depression Screen PHQ 2/9 Scores 01/16/2021 08/21/2020 08/17/2019 02/11/2019 06/11/2018 09/04/2017 12/02/2016  PHQ - 2 Score 0 0 0 0 1 0 0  PHQ- 9 Score - - - - 2 - -    Fall Risk Fall Risk  01/16/2021 08/21/2020 08/21/2020 08/17/2019 08/17/2019  Falls in the past year? 1 1 1  0 0  Number falls in past yr: 1 0 0 0 0  Injury with Fall? 1 1 1  0 0  Risk for fall due to : History of fall(s);Impaired balance/gait History of fall(s) No Fall Risks - -  Follow up Falls evaluation completed Falls prevention discussed Falls evaluation completed - -    FALL RISK PREVENTION PERTAINING TO THE HOME:  Any stairs in or around the home? No  If so, are there any without handrails? No  Home free  of loose throw rugs in walkways, pet beds, electrical cords, etc? Yes  Adequate lighting in your home to reduce risk of falls? Yes   ASSISTIVE DEVICES UTILIZED TO PREVENT FALLS:  Life alert? Yes  Use of a cane, walker or w/c? Yes  Grab bars in the bathroom? Yes  Shower chair or bench in shower? No  Elevated toilet seat or a handicapped toilet? No   TIMED UP AND GO:  Was the test performed? No .  Length of time to ambulate 10 feet: 0 sec.   Gait slow and steady without use of assistive device  Cognitive Function: MMSE - Mini Mental State Exam 06/11/2018  Orientation to time 5  Orientation to Place 5  Registration  3  Attention/ Calculation 5  Recall 3  Language- name 2 objects 2  Language- repeat 1  Language- follow 3 step command 3  Language- read & follow direction 1  Write a sentence 1  Copy design 1  Total score 30     6CIT Screen 08/17/2019  What Year? 0 points  What month? 0 points  What time? 0 points  Count back from 20 0 points  Months in reverse 0 points  Repeat phrase 2 points  Total Score 2    Immunizations Immunization History  Administered Date(s) Administered  . Fluad Quad(high Dose 65+) 06/25/2020  . Influenza, High Dose Seasonal PF 06/30/2014, 07/04/2017, 06/11/2018, 06/22/2019  . Influenza,inj,Quad PF,6+ Mos 07/04/2017  . Influenza-Unspecified 07/06/2013, 06/07/2015, 07/08/2016  . PFIZER(Purple Top)SARS-COV-2 Vaccination 10/22/2019, 11/11/2019  . Pneumococcal Conjugate-13 06/07/2015  . Pneumococcal Polysaccharide-23 12/05/2013, 07/08/2016  . Td 02/10/2015    TDAP status: Up to date  Flu Vaccine status: Up to date  Pneumococcal vaccine status: Up to date  Covid-19 vaccine status: Completed vaccines  Qualifies for Shingles Vaccine? Yes   Zostavax completed No   Shingrix Completed?: No.    Education has been provided regarding the importance of this vaccine. Patient has been advised to call insurance company to determine out of pocket expense if they have not yet received this vaccine. Advised may also receive vaccine at local pharmacy or Health Dept. Verbalized acceptance and understanding.  Screening Tests Health Maintenance  Topic Date Due  . COVID-19 Vaccine (3 - Booster for Pfizer series) 07/10/2020  . INFLUENZA VACCINE  05/06/2021  . TETANUS/TDAP  02/09/2025  . PNA vac Low Risk Adult  Completed  . HPV VACCINES  Aged Out    Health Maintenance  Health Maintenance Due  Topic Date Due  . COVID-19 Vaccine (3 - Booster for Pfizer series) 07/10/2020    Colorectal cancer screening: No longer required.   Lung Cancer Screening: (Low Dose CT Chest  recommended if Age 34-80 years, 30 pack-year currently smoking OR have quit w/in 15years.) does not qualify.   Lung Cancer Screening Referral: no  Additional Screening:  Hepatitis C Screening: does not qualify; Completed no  Vision Screening: Recommended annual ophthalmology exams for early detection of glaucoma and other disorders of the eye. Is the patient up to date with their annual eye exam?  Yes  Who is the provider or what is the name of the office in which the patient attends annual eye exams? Katy Apo, MD. If pt is not established with a provider, would they like to be referred to a provider to establish care? No .   Dental Screening: Recommended annual dental exams for proper oral hygiene  Community Resource Referral / Chronic Care Management: CRR required this visit?  No  CCM required this visit?  No      Plan:     I have personally reviewed and noted the following in the patient's chart:   . Medical and social history . Use of alcohol, tobacco or illicit drugs  . Current medications and supplements . Functional ability and status . Nutritional status . Physical activity . Advanced directives . List of other physicians . Hospitalizations, surgeries, and ER visits in previous 12 months . Vitals . Screenings to include cognitive, depression, and falls . Referrals and appointments  In addition, I have reviewed and discussed with patient certain preventive protocols, quality metrics, and best practice recommendations. A written personalized care plan for preventive services as well as general preventive health recommendations were provided to patient.     Sheral Flow, LPN   4/92/0100   Nurse Notes:  Normal cognitive status assessed by direct observation by this Nurse Health Advisor. No abnormalities found.  Medications reviewed with patient; no opioid use noted.

## 2021-01-16 NOTE — Patient Instructions (Signed)
Paul Mays , Thank you for taking time to come for your Medicare Wellness Visit. I appreciate your ongoing commitment to your health goals. Please review the following plan we discussed and let me know if I can assist you in the future.   Screening recommendations/referrals: Colonoscopy: no repeat due to age Recommended yearly ophthalmology/optometry visit for glaucoma screening and checkup Recommended yearly dental visit for hygiene and checkup  Vaccinations: Influenza vaccine: 06/25/2020 Pneumococcal vaccine: 06/07/2015, 07/08/2016 Tdap vaccine: 02/10/2015 Shingles vaccine: never done; Please call your insurance company to determine your out of pocket expense for the Shingrix vaccine. You may receive this vaccine at your local pharmacy. Covid-19: 10/22/2019, 11/11/2019, 06/25/2020  Advanced directives: Documents on file  Conditions/risks identified: Yes. Reviewed health maintenance screenings with patient today and relevant education, vaccines, and/or referrals were provided. Continue doing brain stimulating activities (puzzles, reading, adult coloring books, staying active) to keep memory sharp. Continue to eat heart healthy diet (full of fruits, vegetables, whole grains, lean protein, water--limit salt, fat, and sugar intake) and increase physical activity as tolerated.  Next appointment:  Please schedule your next Medicare Wellness Visit with your Nurse Health Advisor in 1 year by calling [phone].  Preventive Care 40 Years and Older, Male Preventive care refers to lifestyle choices and visits with your health care provider that can promote health and wellness. What does preventive care include?  A yearly physical exam. This is also called an annual well check.  Dental exams once or twice a year.  Routine eye exams. Ask your health care provider how often you should have your eyes checked.  Personal lifestyle choices, including:  Daily care of your teeth and gums.  Regular physical  activity.  Eating a healthy diet.  Avoiding tobacco and drug use.  Limiting alcohol use.  Practicing safe sex.  Taking low doses of aspirin every day.  Taking vitamin and mineral supplements as recommended by your health care provider. What happens during an annual well check? The services and screenings done by your health care provider during your annual well check will depend on your age, overall health, lifestyle risk factors, and family history of disease. Counseling  Your health care provider may ask you questions about your:  Alcohol use.  Tobacco use.  Drug use.  Emotional well-being.  Home and relationship well-being.  Sexual activity.  Eating habits.  History of falls.  Memory and ability to understand (cognition).  Work and work Statistician. Screening  You may have the following tests or measurements:  Height, weight, and BMI.  Blood pressure.  Lipid and cholesterol levels. These may be checked every 5 years, or more frequently if you are over 67 years old.  Skin check.  Lung cancer screening. You may have this screening every year starting at age 42 if you have a 30-pack-year history of smoking and currently smoke or have quit within the past 15 years.  Fecal occult blood test (FOBT) of the stool. You may have this test every year starting at age 85.  Flexible sigmoidoscopy or colonoscopy. You may have a sigmoidoscopy every 5 years or a colonoscopy every 10 years starting at age 51.  Prostate cancer screening. Recommendations will vary depending on your family history and other risks.  Hepatitis C blood test.  Hepatitis B blood test.  Sexually transmitted disease (STD) testing.  Diabetes screening. This is done by checking your blood sugar (glucose) after you have not eaten for a while (fasting). You may have this done every 1-3 years.  Abdominal aortic aneurysm (AAA) screening. You may need this if you are a current or former  smoker.  Osteoporosis. You may be screened starting at age 35 if you are at high risk. Talk with your health care provider about your test results, treatment options, and if necessary, the need for more tests. Vaccines  Your health care provider may recommend certain vaccines, such as:  Influenza vaccine. This is recommended every year.  Tetanus, diphtheria, and acellular pertussis (Tdap, Td) vaccine. You may need a Td booster every 10 years.  Zoster vaccine. You may need this after age 81.  Pneumococcal 13-valent conjugate (PCV13) vaccine. One dose is recommended after age 74.  Pneumococcal polysaccharide (PPSV23) vaccine. One dose is recommended after age 23. Talk to your health care provider about which screenings and vaccines you need and how often you need them. This information is not intended to replace advice given to you by your health care provider. Make sure you discuss any questions you have with your health care provider. Document Released: 10/19/2015 Document Revised: 06/11/2016 Document Reviewed: 07/24/2015 Elsevier Interactive Patient Education  2017 [place] Prevention in the Home Falls can cause injuries. They can happen to people of all ages. There are many things you can do to make your home safe and to help prevent falls. What can I do on the outside of my home?  Regularly fix the edges of walkways and driveways and fix any cracks.  Remove anything that might make you trip as you walk through a door, such as a raised step or threshold.  Trim any bushes or trees on the path to your home.  Use bright outdoor lighting.  Clear any walking paths of anything that might make someone trip, such as rocks or tools.  Regularly check to see if handrails are loose or broken. Make sure that both sides of any steps have handrails.  Any raised decks and porches should have guardrails on the edges.  Have any leaves, snow, or ice cleared regularly.  Use sand or  salt on walking paths during winter.  Clean up any spills in your garage right away. This includes oil or grease spills. What can I do in the bathroom?  Use night lights.  Install grab bars by the toilet and in the tub and shower. Do not use towel bars as grab bars.  Use non-skid mats or decals in the tub or shower.  If you need to sit down in the shower, use a plastic, non-slip stool.  Keep the floor dry. Clean up any water that spills on the floor as soon as it happens.  Remove soap buildup in the tub or shower regularly.  Attach bath mats securely with double-sided non-slip rug tape.  Do not have throw rugs and other things on the floor that can make you trip. What can I do in the bedroom?  Use night lights.  Make sure that you have a light by your bed that is easy to reach.  Do not use any sheets or blankets that are too big for your bed. They should not hang down onto the floor.  Have a firm chair that has side arms. You can use this for support while you get dressed.  Do not have throw rugs and other things on the floor that can make you trip. What can I do in the kitchen?  Clean up any spills right away.  Avoid walking on wet floors.  Keep items that you use  a lot in easy-to-reach places.  If you need to reach something above you, use a strong step stool that has a grab bar.  Keep electrical cords out of the way.  Do not use floor polish or wax that makes floors slippery. If you must use wax, use non-skid floor wax.  Do not have throw rugs and other things on the floor that can make you trip. What can I do with my stairs?  Do not leave any items on the stairs.  Make sure that there are handrails on both sides of the stairs and use them. Fix handrails that are broken or loose. Make sure that handrails are as long as the stairways.  Check any carpeting to make sure that it is firmly attached to the stairs. Fix any carpet that is loose or worn.  Avoid having  throw rugs at the top or bottom of the stairs. If you do have throw rugs, attach them to the floor with carpet tape.  Make sure that you have a light switch at the top of the stairs and the bottom of the stairs. If you do not have them, ask someone to add them for you. What else can I do to help prevent falls?  Wear shoes that:  Do not have high heels.  Have rubber bottoms.  Are comfortable and fit you well.  Are closed at the toe. Do not wear sandals.  If you use a stepladder:  Make sure that it is fully opened. Do not climb a closed stepladder.  Make sure that both sides of the stepladder are locked into place.  Ask someone to hold it for you, if possible.  Clearly mark and make sure that you can see:  Any grab bars or handrails.  First and last steps.  Where the edge of each step is.  Use tools that help you move around (mobility aids) if they are needed. These include:  Canes.  Walkers.  Scooters.  Crutches.  Turn on the lights when you go into a dark area. Replace any light bulbs as soon as they burn out.  Set up your furniture so you have a clear path. Avoid moving your furniture around.  If any of your floors are uneven, fix them.  If there are any pets around you, be aware of where they are.  Review your medicines with your doctor. Some medicines can make you feel dizzy. This can increase your chance of falling. Ask your doctor what other things that you can do to help prevent falls. This information is not intended to replace advice given to you by your health care provider. Make sure you discuss any questions you have with your health care provider. Document Released: 07/19/2009 Document Revised: 02/28/2016 Document Reviewed: 10/27/2014 Elsevier Interactive Patient Education  2017 [employer].

## 2021-01-29 ENCOUNTER — Other Ambulatory Visit: Payer: Self-pay

## 2021-01-29 ENCOUNTER — Ambulatory Visit: Payer: Medicare Other | Attending: Internal Medicine

## 2021-01-29 DIAGNOSIS — M25612 Stiffness of left shoulder, not elsewhere classified: Secondary | ICD-10-CM

## 2021-01-29 DIAGNOSIS — M6281 Muscle weakness (generalized): Secondary | ICD-10-CM

## 2021-01-29 DIAGNOSIS — M25511 Pain in right shoulder: Secondary | ICD-10-CM | POA: Insufficient documentation

## 2021-01-29 DIAGNOSIS — M25611 Stiffness of right shoulder, not elsewhere classified: Secondary | ICD-10-CM

## 2021-01-29 DIAGNOSIS — M25512 Pain in left shoulder: Secondary | ICD-10-CM | POA: Diagnosis not present

## 2021-01-29 DIAGNOSIS — G8929 Other chronic pain: Secondary | ICD-10-CM

## 2021-01-29 NOTE — Therapy (Signed)
[place], [place], [address] Phone: [phone]   Fax:  [phone]  Physical Therapy Evaluation  Patient Details  Name: Paul Mays MRN: [id number] Date of Birth: 1928-06-11 Referring Provider (PT): Binnie Rail, MD   Encounter Date: 01/29/2021   PT End of Session - 01/29/21 1357    Visit Number 1    Number of Visits 17    Date for PT Re-Evaluation 04/04/21    Authorization Type Medicare - FOTO 6th and 10th    Progress Note Due on Visit 10    PT Start Time 1355    PT Stop Time 1445    PT Time Calculation (min) 50 min    Activity Tolerance Patient tolerated treatment well;No increased pain    Behavior During Therapy WFL for tasks assessed/performed           Past Medical History:  Diagnosis Date  . Anxiety   . Aortic stenosis   . Arthralgia   . Arthritis   . CAD (coronary artery disease)   . Carotid stenosis   . Cervicalgia   . CLUSTER HEADACHE 02/10/2007   Qualifier: Diagnosis of  By: Linna Darner MD, Gwyndolyn Saxon    . Colitis   . Edema   . Gout   . Hearing loss   . Herpes zoster without mention of complication   . Hyperlipidemia   . Hypertension   . Hyperuricemia   . Impacted cerumen   . Migraine   . Migraine   . Murmur   . Olecranon bursitis   . OSA (obstructive sleep apnea)   . Other and unspecified hyperlipidemia   . Other malaise and fatigue   . Peripheral neuropathy   . Polyneuropathy   . Pulmonary hypertension ([place])   . Unspecified hereditary and idiopathic peripheral neuropathy   . Venous insufficiency     Past Surgical History:  Procedure Laterality Date  . ANGIOPLASTY     w/ 2 stents in 1999  . APPENDECTOMY    . HEMORRHOID SURGERY    . LIPOMA EXCISION    . TONSILLECTOMY      There were no vitals filed for this visit.    Subjective Assessment - 01/29/21 1357    Subjective Pt is a pleasant 87 y/o M who presents to PT with reports of chronic bilateral shoulder pain.  He has had pain for many years with many injections that did not seem to alleviate pain. He denies any symptoms distal to the bicep and also denies any parethesias down either UE. Pt is L hand dominant and notes pain is sometimes worse in that UE. He reports that he has had previous therapy and notes that it aggravated his shoulder, especially the UBE. Pt is the primary caregiver for his wife who has had two strokes over the last few years. He assists with all meals and helps her transfer into bed and from sitting surfaces. Pt still drives and notes pain in bilateral shoulders when turning the steering wheel. Pt has had two recent falls while walking in yard and notes occasional dizziness when moving from sitting to standing, or supine to sitting.    Limitations Lifting    How long can you sit comfortably? a few hours    How long can you stand comfortably? 20-25 minutes    How long can you walk comfortably? 30 minutes    Patient Stated Goals pt wants to decrease bilateral shoulder pain in order to assist wife with  transfers and other ADLs    Currently in Pain? Yes    Pain Score 5    8/10 at worst   Pain Location Shoulder    Pain Orientation Right;Left    Pain Descriptors / Indicators Aching;Dull    Pain Type Chronic pain    Pain Onset More than a month ago    Pain Frequency Intermittent    Aggravating Factors  lfiting, reaching, caregiving duties    Pain Relieving Factors rest              [facility] PT Assessment - 01/29/21 0001      Assessment   Medical Diagnosis 25.511,G89.29,M25.512 (ICD-10-CM) - Chronic pain of both shoulders    Referring Provider (PT) Quay Burow, Claudina Lick, MD    Hand Dominance Left      Precautions   Precautions Fall      Restrictions   Weight Bearing Restrictions No      Balance Screen   Has the patient fallen in the past 6 months Yes    How many times? 2 - falls both while wakling in yard    Has the patient had a decrease in activity level because of a fear of  falling?  No    Is the patient reluctant to leave their home because of a fear of falling?  No      Home Environment   Living Environment Private residence    Living Arrangements Spouse/significant other    Type of [place] Access Level entry    Putney bars - toilet;Grab bars - tub/shower    Additional Comments patient is caregiver for his wife      Prior Function   Level of Independence Independent    Vocation Retired      Associate Professor   Overall Cognitive Status Within Functional Limits for tasks assessed      Observation/Other Assessments   Focus on Therapeutic Outcomes (FOTO)  46%      Sensation   Light Touch Appears Intact      AROM   Right Shoulder Flexion 70 Degrees    Right Shoulder ABduction 60 Degrees    Left Shoulder Flexion 95 Degrees    Left Shoulder ABduction 75 Degrees      Strength   Right Shoulder Flexion 2+/5    Right Shoulder ABduction 2+/5    Right Shoulder Internal Rotation 3/5    Right Shoulder External Rotation 3/5    Left Shoulder Flexion 2+/5    Left Shoulder ABduction 2+/5    Left Shoulder Internal Rotation 2+/5    Left Shoulder External Rotation 3/5      Palpation   Palpation comment TTP to bilateral anterior and posterior shoulder, bilateral scapular medial borders, bilateral upper traps                      Objective measurements completed on examination: See above findings.       [facility] Adult PT Treatment/Exercise - 01/29/21 0001      Exercises   Exercises Shoulder      Shoulder Exercises: Seated   Retraction Both;10 reps    Row 10 reps;Theraband    Theraband Level (Shoulder Row) Level 1 (Yellow)      Shoulder Exercises: Stretch   Table Stretch - Flexion 5 reps    Table Stretch -Flexion Limitations each UE  PT Education - 01/29/21 1620    Education Details evaluation findings, HEP, POC    Person(s) Educated Patient    Methods  Explanation;Demonstration;Handout    Comprehension Verbalized understanding;Returned demonstration;Need further instruction            PT Short Term Goals - 01/29/21 1808      PT SHORT TERM GOAL #1   Title Pt will be compliant and knowledgeable with HEP in order to improve comfort and carryover    Baseline initial HEP given    Time 3    Period Weeks    Status New    Target Date 02/19/21      PT SHORT TERM GOAL #2   Title Pt will self report bilateral shoulder pain no greater than 4/10 at worst in order to improve comfort    Baseline 8/10 at worst    Time 3    Period Weeks    Status New    Target Date 02/19/21             PT Long Term Goals - 01/29/21 1812      PT LONG TERM GOAL #1   Title Pt will improve FOTO score to no less than 60% function in order to improve confidence and functional ability    Baseline 46% function    Time 8    Period Weeks    Status New    Target Date 04/04/21      PT LONG TERM GOAL #2   Title Pt will improve bilateral shoulder AROM flex/abd to 130 degrees in order to improve functional ability with reaching into cabinets    Baseline see flowsheet    Time 8    Period Weeks    Status New    Target Date 04/04/21      PT LONG TERM GOAL #3   Title Pt will be able to lift 5lb from chest to shelf height with no increase in pain in order to improve ADL performance    Baseline see pain scale    Time 8    Period Weeks    Status New    Target Date 04/04/21      PT LONG TERM GOAL #4   Title Pt will self report bilateral shoulder pain no greater than 3/10 in order to improve comfort and function    Baseline 8/10 at worst    Time 8    Period Weeks    Status New    Target Date 04/04/21                  Plan - 01/29/21 1823    Clinical Impression Statement Pt is a pleasant 87 y/o M who presents to PT with reports of chronic bilateral shoulder pain. Physical findings are consistent with MD impression, as pt has palpable tenderness,  decreased strength, and limited ROM in bilateral shoulders. These impairments are currently decreasing his functional ability to perform desired ADLs and caregiving duties for his wife. He would benefit from skilled PT services working on increasing periscapular and proximal shoulder muscle strength in order to decrease pain and improve function. PT will assess response to initial HEP and progress as able.    Personal Factors and Comorbidities Age;Time since onset of injury/illness/exacerbation;Comorbidity 1    Comorbidities HTN    Examination-Activity Limitations Lift;Bend;Carry;Caring for Others;Stairs;Stand    Examination-Participation Restrictions Cleaning;Meal Prep;Driving;Community Activity    Stability/Clinical Decision Making Stable/Uncomplicated    Clinical Decision Making Low    Rehab  Potential Good    PT Frequency 2x / week    PT Duration Other (comment)   10 weeks   PT Treatment/Interventions ADLs/Self Care Home Management;Electrical Stimulation;Moist Heat;Gait training;Stair training;Therapeutic activities;Therapeutic exercise;Neuromuscular re-education;Patient/family education;Manual techniques;Passive range of motion;Dry needling    PT Next Visit Plan assess response to HEP, progress exercises as able    PT Home Exercise Plan Access Code: [email or web address]    Consulted and Agree with Plan of Care Patient           Patient will benefit from skilled therapeutic intervention in order to improve the following deficits and impairments:  Abnormal gait,Decreased activity tolerance,Decreased balance,Decreased endurance,Decreased mobility,Decreased range of motion,Decreased strength,Difficulty walking,Dizziness,Impaired UE functional use,Pain  Visit Diagnosis: Muscle weakness (generalized) - Plan: PT plan of care cert/re-cert  Chronic left shoulder pain - Plan: PT plan of care cert/re-cert  Chronic right shoulder pain - Plan: PT plan of care cert/re-cert  Stiffness of left shoulder, not  elsewhere classified - Plan: PT plan of care cert/re-cert  Stiffness of right shoulder, not elsewhere classified - Plan: PT plan of care cert/re-cert     Problem List Patient Active Problem List   Diagnosis Date Noted  . Hand laceration 11/06/2020  . Fall 08/21/2020  . Head trauma, initial encounter 08/21/2020  . Bilateral leg edema 01/26/2020  . GERD (gastroesophageal reflux disease) 01/26/2020  . Chronic pain of both shoulders 12/10/2018  . Easy bruising 03/20/2018  . Decreased hearing of right ear 09/04/2017  . Rotator cuff tear arthropathy of both shoulders 10/18/2015  . Thrombocytopenia ([place]) 09/24/2015  . Diverticulosis of colon without hemorrhage 07/02/2014  . Hypertrophic cardiomyopathy ([place]) 03/24/2011  . OBSTRUCTIVE SLEEP APNEA 11/08/2010  . HYPERTENSION, PULMONARY 09/16/2010  . HEARING LOSS, BILATERAL 06/18/2009  . Coronary atherosclerosis 03/07/2009  . Aortic valve disorder, mod AR, mod AS 03/07/2009  . CAROTID STENOSIS 03/07/2009  . Hyperlipidemia 11/15/2008  . Peripheral neuropathy, idiopathic 11/15/2008  . Essential hypertension 11/15/2008  . ARTHRALGIA 11/15/2008  . COLITIS, HX OF 11/15/2008  . VENOUS INSUFFICIENCY 04/20/2007  . Gout 02/10/2007    Ward Chatters, PT, DPT 01/29/21 6:34 PM  [facility] [place] [address] [place], [place], [address] Phone: [phone]   Fax:  [phone]  Name: Paul Mays MRN: [id number] Date of Birth: [birth year]

## 2021-02-06 ENCOUNTER — Encounter: Payer: Medicare Other | Admitting: Physical Therapy

## 2021-02-07 ENCOUNTER — Other Ambulatory Visit: Payer: Self-pay | Admitting: Internal Medicine

## 2021-02-12 ENCOUNTER — Ambulatory Visit: Payer: Medicare Other | Admitting: Physical Therapy

## 2021-02-14 ENCOUNTER — Ambulatory Visit: Payer: Medicare Other | Admitting: Physical Therapy

## 2021-02-18 NOTE — Progress Notes (Signed)
Subjective:    Patient ID: Paul Mays, male    DOB: November 16, 1927, 87 y.o.   MRN: [id number]  HPI The patient is here for an acute visit.   Lightheadedness -  He thinks it started about 3 months ago.  He has this when he gets up in the morning and it lasts all day.  The lightheadedness is worse when he stands, but gets better with sitting but does not go away.     He has lost a considerable amount of weight - He eats what he wants to eat.  He does not eat much different compared to last year.  He thinks he is probably eating less - smaller portion.  He has a couple of drinks before dinner.  He does not exercise.  Wt Readings from Last 3 Encounters:  02/19/21 148 lb 3.2 oz (67.2 kg)  01/16/21 155 lb 6.4 oz (70.5 kg)  11/06/20 159 lb (72.1 kg)      Medications and allergies reviewed with patient and updated if appropriate.  Patient Active Problem List   Diagnosis Date Noted  . Weight loss 02/19/2021  . Lightheadedness 02/19/2021  . Hand laceration 11/06/2020  . Fall 08/21/2020  . Head trauma, initial encounter 08/21/2020  . Bilateral leg edema 01/26/2020  . GERD (gastroesophageal reflux disease) 01/26/2020  . Chronic pain of both shoulders 12/10/2018  . Easy bruising 03/20/2018  . Decreased hearing of right ear 09/04/2017  . Rotator cuff tear arthropathy of both shoulders 10/18/2015  . Thrombocytopenia ([place]) 09/24/2015  . Diverticulosis of colon without hemorrhage 07/02/2014  . Hypertrophic cardiomyopathy ([place]) 03/24/2011  . OBSTRUCTIVE SLEEP APNEA 11/08/2010  . HYPERTENSION, PULMONARY 09/16/2010  . HEARING LOSS, BILATERAL 06/18/2009  . Coronary atherosclerosis 03/07/2009  . Aortic valve disorder, mod AR, mod AS 03/07/2009  . CAROTID STENOSIS 03/07/2009  . Hyperlipidemia 11/15/2008  . Peripheral neuropathy, idiopathic 11/15/2008  . Essential hypertension 11/15/2008  . ARTHRALGIA 11/15/2008  . COLITIS, HX OF 11/15/2008  . VENOUS INSUFFICIENCY 04/20/2007  . Gout  02/10/2007    Current Outpatient Medications on File Prior to Visit  Medication Sig Dispense Refill  . allopurinol (ZYLOPRIM) 300 MG tablet Take 300 mg by mouth daily.    Marland Kitchen aspirin EC 81 MG tablet Take 1 tablet (81 mg total) by mouth daily.    . furosemide (LASIX) 20 MG tablet Take 1 tablet (20 mg total) by mouth daily. 90 tablet 1  . gabapentin (NEURONTIN) 600 MG tablet Take 1 tablet (600 mg total) by mouth 2 (two) times daily. 60 tablet 0  . hydrocortisone 2.5 % cream Apply topically 2 (two) times daily.    Marland Kitchen losartan (COZAAR) 50 MG tablet TAKE 1 TABLET(50 MG) BY MOUTH DAILY 90 tablet 3  . metoprolol (LOPRESSOR) 50 MG tablet Take 25 mg by mouth 2 (two) times daily. 1/2 tab by mouth two times a day.    Marland Kitchen omeprazole (PRILOSEC) 20 MG capsule TAKE 2 CAPSULES DAILY FOR 1 MONTH, THEN 1 CAPSULE DAILY 60 capsule 3  . pravastatin (PRAVACHOL) 40 MG tablet Take 40 mg by mouth daily.    Marland Kitchen terazosin (HYTRIN) 2 MG capsule Take 2 mg by mouth at bedtime.    . traMADol (ULTRAM) 50 MG tablet Take 1 tablet (50 mg total) by mouth every 12 (twelve) hours as needed for moderate pain (for chronic shoulder pain). 60 tablet 1   No current facility-administered medications on file prior to visit.    Past Medical History:  Diagnosis Date  .  Anxiety   . Aortic stenosis   . Arthralgia   . Arthritis   . CAD (coronary artery disease)   . Carotid stenosis   . Cervicalgia   . CLUSTER HEADACHE 02/10/2007   Qualifier: Diagnosis of  By: Linna Darner MD, Gwyndolyn Saxon    . Colitis   . Edema   . Gout   . Hearing loss   . Herpes zoster without mention of complication   . Hyperlipidemia   . Hypertension   . Hyperuricemia   . Impacted cerumen   . Migraine   . Migraine   . Murmur   . Olecranon bursitis   . OSA (obstructive sleep apnea)   . Other and unspecified hyperlipidemia   . Other malaise and fatigue   . Peripheral neuropathy   . Polyneuropathy   . Pulmonary hypertension ([place])   . Unspecified hereditary and  idiopathic peripheral neuropathy   . Venous insufficiency     Past Surgical History:  Procedure Laterality Date  . ANGIOPLASTY     w/ 2 stents in 1999  . APPENDECTOMY    . HEMORRHOID SURGERY    . LIPOMA EXCISION    . TONSILLECTOMY      Social History   Socioeconomic History  . Marital status: Married    Spouse name: [name]  . Number of children: 2  . Years of education: college  . Highest education level: Not on file  Occupational History  . 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 Smoking status: Never Smoker  . Smokeless tobacco: Never Used  Vaping Use  . Vaping Use: Never used  Substance and Sexual Activity  . Alcohol use: Yes    Alcohol/week: 0.0 standard drinks    Comment: 2 drink before dinner vodka   . Drug use: No  . Sexual activity: Never  Other Topics Concern  . Not on file  Social History Narrative   Pt is married and lives with wife (suzanne). Patient finished  two years of college.    Pt has children.   Caffeine -two cups daily.   Left handed.   Retired Licensed conveyancer                    Social Determinants of Radio broadcast assistant Strain: [place]   . Difficulty of Paying Living Expenses: Not hard at all  Food Insecurity: No Food Insecurity  . Worried About Charity fundraiser in the Last Year: Never true  . Ran Out of Food in the Last Year: Never true  Transportation Needs: No Transportation Needs  . Lack of Transportation (Medical): No  . Lack of Transportation (Non-Medical): No  Physical Activity: Sufficiently Active  . Days of Exercise per Week: 5 days  . Minutes of Exercise per Session: 30 min  Stress: No Stress Concern Present  . Feeling of Stress : Not at all  Social Connections: Socially Integrated  . Frequency of Communication with Friends and Family: More than three times a week  . Frequency of Social Gatherings with Friends and Family: More than three times a  week  . Attends Religious Services: More than 4 times per year  . Active Member of Clubs or Organizations: Yes  . Attends Archivist Meetings: More than 4 times per year  . Marital Status: Married    Family History  Problem Relation Age of Onset  . Stroke Father  Died, 83  . High blood pressure Father   . Diabetes Father   . Other Mother        Died, 88  . Diabetes Brother        Died, 82  . Diabetes Sister        Living, 5  . Healthy Daughter   . Healthy Son     Review of Systems  Constitutional: Negative for chills, diaphoresis and fever.  HENT: Negative for trouble swallowing.   Respiratory: Negative for cough, shortness of breath and wheezing.   Cardiovascular: Positive for chest pain (a little daily, lasts < 1 minute, occurs randomly), palpitations (chronic) and leg swelling (mild).  Gastrointestinal: Positive for blood in stool (? hemorrhoids - occurw with wiping) and constipation. Negative for abdominal pain, diarrhea and nausea.       No gerd  Genitourinary: Negative for difficulty urinating, dysuria and hematuria.  Neurological: Positive for light-headedness. Negative for dizziness and headaches.       Objective:   Vitals:   02/19/21 1347  BP: 130/64  Pulse: (!) 59  Temp: 98.2 F (36.8 C)  SpO2: 95%   BP Readings from Last 3 Encounters:  02/19/21 130/64  01/16/21 120/70  11/06/20 140/78   Wt Readings from Last 3 Encounters:  02/19/21 148 lb 3.2 oz (67.2 kg)  01/16/21 155 lb 6.4 oz (70.5 kg)  11/06/20 159 lb (72.1 kg)   Body mass index is 23.92 kg/m.   Physical Exam Constitutional:      General: He is not in acute distress.    Appearance: Normal appearance. He is not ill-appearing.  HENT:     Head: Normocephalic and atraumatic.  Eyes:     Conjunctiva/sclera: Conjunctivae normal.  Cardiovascular:     Rate and Rhythm: Normal rate and regular rhythm.     Heart sounds: Murmur heard.    Pulmonary:     Effort: Pulmonary effort  is normal. No respiratory distress.     Breath sounds: No wheezing or rales.  Abdominal:     General: There is no distension.     Palpations: Abdomen is soft.     Tenderness: There is abdominal tenderness (mild in LMQ near umbilicus, ? firmness in this area). There is no guarding or rebound.     Hernia: No hernia is present.  Musculoskeletal:     Cervical back: Neck supple. No tenderness.     Right lower leg: Edema (trace) present.     Left lower leg: Edema (trace) present.  Lymphadenopathy:     Cervical: No cervical adenopathy.  Skin:    Findings: Bruising (right lower extremity - from cat and b/l UE - forearms) present.  Neurological:     Mental Status: He is alert.            Assessment & Plan:    See Problem List for Assessment and Plan of chronic medical problems.    This visit occurred during the SARS-CoV-2 public health emergency.  Safety protocols were in place, including screening questions prior to the visit, additional usage of staff PPE, and extensive cleaning of exam room while observing appropriate contact time as indicated for disinfecting solutions.

## 2021-02-19 ENCOUNTER — Ambulatory Visit (INDEPENDENT_AMBULATORY_CARE_PROVIDER_SITE_OTHER): Payer: Medicare Other | Admitting: Internal Medicine

## 2021-02-19 ENCOUNTER — Ambulatory Visit (INDEPENDENT_AMBULATORY_CARE_PROVIDER_SITE_OTHER): Payer: Medicare Other

## 2021-02-19 ENCOUNTER — Encounter: Payer: Self-pay | Admitting: Internal Medicine

## 2021-02-19 ENCOUNTER — Other Ambulatory Visit: Payer: Self-pay

## 2021-02-19 ENCOUNTER — Ambulatory Visit: Payer: Medicare Other | Admitting: Physical Therapy

## 2021-02-19 VITALS — BP 130/64 | HR 59 | Temp 98.2°F | Ht 66.0 in | Wt 148.2 lb

## 2021-02-19 DIAGNOSIS — J90 Pleural effusion, not elsewhere classified: Secondary | ICD-10-CM | POA: Diagnosis not present

## 2021-02-19 DIAGNOSIS — I2510 Atherosclerotic heart disease of native coronary artery without angina pectoris: Secondary | ICD-10-CM | POA: Diagnosis not present

## 2021-02-19 DIAGNOSIS — R42 Dizziness and giddiness: Secondary | ICD-10-CM | POA: Insufficient documentation

## 2021-02-19 DIAGNOSIS — Z125 Encounter for screening for malignant neoplasm of prostate: Secondary | ICD-10-CM | POA: Diagnosis not present

## 2021-02-19 DIAGNOSIS — R634 Abnormal weight loss: Secondary | ICD-10-CM

## 2021-02-19 DIAGNOSIS — I517 Cardiomegaly: Secondary | ICD-10-CM | POA: Diagnosis not present

## 2021-02-19 DIAGNOSIS — R1033 Periumbilical pain: Secondary | ICD-10-CM | POA: Diagnosis not present

## 2021-02-19 LAB — COMPREHENSIVE METABOLIC PANEL
ALT: 14 U/L (ref 0–53)
AST: 16 U/L (ref 0–37)
Albumin: 4.2 g/dL (ref 3.5–5.2)
Alkaline Phosphatase: 31 U/L — ABNORMAL LOW (ref 39–117)
BUN: 25 mg/dL — ABNORMAL HIGH (ref 6–23)
CO2: 31 mEq/L (ref 19–32)
Calcium: 9.7 mg/dL (ref 8.4–10.5)
Chloride: 106 mEq/L (ref 96–112)
Creatinine, Ser: 1.03 mg/dL (ref 0.40–1.50)
GFR: 63.61 mL/min (ref >60.00)
Glucose, Bld: 88 mg/dL (ref 70–99)
Potassium: 5 mEq/L (ref 3.5–5.1)
Sodium: 143 mEq/L (ref 135–145)
Total Bilirubin: 2.3 mg/dL — ABNORMAL HIGH (ref 0.2–1.2)
Total Protein: 6 g/dL (ref 6.0–8.3)

## 2021-02-19 LAB — CBC WITH DIFFERENTIAL/PLATELET
Basophils Absolute: 0.1 10*3/uL (ref 0.0–0.1)
Basophils Relative: 1.1 % (ref 0.0–3.0)
Eosinophils Absolute: 0.1 10*3/uL (ref 0.0–0.7)
Eosinophils Relative: 2.2 % (ref 0.0–5.0)
HCT: 38.5 % — ABNORMAL LOW (ref 39.0–52.0)
Hemoglobin: 12.9 g/dL — ABNORMAL LOW (ref 13.0–17.0)
Lymphocytes Relative: 22.6 % (ref 12.0–46.0)
Lymphs Abs: 1.1 10*3/uL (ref 0.7–4.0)
MCHC: 33.4 g/dL (ref 30.0–36.0)
MCV: 94.4 fl (ref 78.0–100.0)
Monocytes Absolute: 0.6 10*3/uL (ref 0.1–1.0)
Monocytes Relative: 12.2 % — ABNORMAL HIGH (ref 3.0–12.0)
Neutro Abs: 3 10*3/uL (ref 1.4–7.7)
Neutrophils Relative %: 61.9 % (ref 43.0–77.0)
Platelets: 115 10*3/uL — ABNORMAL LOW (ref 150.0–400.0)
RBC: 4.08 Mil/uL — ABNORMAL LOW (ref 4.22–5.81)
RDW: 16.8 % — ABNORMAL HIGH (ref 11.5–15.5)
WBC: 4.8 10*3/uL (ref 4.0–10.5)

## 2021-02-19 LAB — TSH: TSH: 2.34 u[IU]/mL (ref 0.35–4.50)

## 2021-02-19 LAB — PSA, MEDICARE: PSA: 0 ng/ml — ABNORMAL LOW (ref 0.10–4.00)

## 2021-02-19 MED ORDER — LOSARTAN POTASSIUM 25 MG PO TABS: 25.0000 mg | ORAL_TABLET | Freq: Every day | ORAL | 1 refills | Status: DC

## 2021-02-19 NOTE — Assessment & Plan Note (Signed)
Significant weight loss - unintentional over the past several months - 08/21/20 he was 165 and today he is 148 lbs  He is likely eating a little less, but not enough to account for this Concerned about cancer Check cbc, cmp, psa, tsh Will get a CXR today Ct Abd/ Pelvis Increase caloric intake  Follow up in 2 weeks

## 2021-02-19 NOTE — Assessment & Plan Note (Signed)
Acute He denied any abdominal pain, but is tender on exam - left side of umbilicus - tenderness is mild  - no rebound or guarding, ? Firmness there Concerned about weight loss and possibility of cancer Cbc, cmp Ct Abd, pelvis

## 2021-02-19 NOTE — Assessment & Plan Note (Signed)
Acute Started about three months ago - somewhat orthostatic We already stopped his hytrin - no change Will decreased losartan to 25 mg daily Increase fluids during the day Follow up in 2 weeks

## 2021-02-19 NOTE — Patient Instructions (Addendum)
  Blood work and a chest xray were ordered.     Medications changes include :   Decrease losartan to 25 mg daily  Your prescription(s) have been submitted to your pharmacy. Please take as directed and contact our office if you believe you are having problem(s) with the medication(s).   A Ct scan was ordered.     Someone  will call you to schedule an appointment.     Follow up in 2 weeks

## 2021-02-21 ENCOUNTER — Ambulatory Visit: Payer: Medicare Other | Admitting: Physical Therapy

## 2021-02-24 NOTE — Progress Notes (Signed)
Subjective:    Patient ID: Paul Mays, male    DOB: 1928/04/12, 87 y.o.   MRN: [id number]  HPI The patient is here for an acute visit.   S/l shoulder pain from OA:  He has chronic OA and has had injections in the past by Dr Tamala Julian and Dr Georgina Snell and would like to have injections again.  He was last seen over one year ago.  The injections usually help him for a few weeks.    Medications and allergies reviewed with patient and updated if appropriate.  Patient Active Problem List   Diagnosis Date Noted  . Weight loss 02/19/2021  . Lightheadedness 02/19/2021  . Periumbilical pain 40/98/1191  . Hand laceration 11/06/2020  . Fall 08/21/2020  . Head trauma, initial encounter 08/21/2020  . Bilateral leg edema 01/26/2020  . GERD (gastroesophageal reflux disease) 01/26/2020  . Chronic pain of both shoulders 12/10/2018  . Easy bruising 03/20/2018  . Decreased hearing of right ear 09/04/2017  . Rotator cuff tear arthropathy of both shoulders 10/18/2015  . Thrombocytopenia ([place]) 09/24/2015  . Diverticulosis of colon without hemorrhage 07/02/2014  . Hypertrophic cardiomyopathy ([place]) 03/24/2011  . OBSTRUCTIVE SLEEP APNEA 11/08/2010  . HYPERTENSION, PULMONARY 09/16/2010  . HEARING LOSS, BILATERAL 06/18/2009  . Coronary atherosclerosis 03/07/2009  . Aortic valve disorder, mod AR, mod AS 03/07/2009  . CAROTID STENOSIS 03/07/2009  . Hyperlipidemia 11/15/2008  . Peripheral neuropathy, idiopathic 11/15/2008  . Essential hypertension 11/15/2008  . ARTHRALGIA 11/15/2008  . COLITIS, HX OF 11/15/2008  . VENOUS INSUFFICIENCY 04/20/2007  . Gout 02/10/2007    Current Outpatient Medications on File Prior to Visit  Medication Sig Dispense Refill  . allopurinol (ZYLOPRIM) 300 MG tablet Take 300 mg by mouth daily.    Marland Kitchen aspirin EC 81 MG tablet Take 1 tablet (81 mg total) by mouth daily.    . furosemide (LASIX) 20 MG tablet Take 1 tablet (20 mg total) by mouth daily. 90 tablet 1  . gabapentin  (NEURONTIN) 600 MG tablet Take 1 tablet (600 mg total) by mouth 2 (two) times daily. 60 tablet 0  . hydrocortisone 2.5 % cream Apply topically 2 (two) times daily.    Marland Kitchen losartan (COZAAR) 25 MG tablet Take 1 tablet (25 mg total) by mouth daily. 90 tablet 1  . metoprolol (LOPRESSOR) 50 MG tablet Take 25 mg by mouth 2 (two) times daily. 1/2 tab by mouth two times a day.    Marland Kitchen omeprazole (PRILOSEC) 20 MG capsule TAKE 2 CAPSULES DAILY FOR 1 MONTH, THEN 1 CAPSULE DAILY 60 capsule 3  . pravastatin (PRAVACHOL) 40 MG tablet Take 40 mg by mouth daily.    . traMADol (ULTRAM) 50 MG tablet Take 1 tablet (50 mg total) by mouth every 12 (twelve) hours as needed for moderate pain (for chronic shoulder pain). 60 tablet 1   No current facility-administered medications on file prior to visit.    Past Medical History:  Diagnosis Date  . Anxiety   . Aortic stenosis   . Arthralgia   . Arthritis   . CAD (coronary artery disease)   . Carotid stenosis   . Cervicalgia   . CLUSTER HEADACHE 02/10/2007   Qualifier: Diagnosis of  By: Linna Darner MD, Gwyndolyn Saxon    . Colitis   . Edema   . Gout   . Hearing loss   . Herpes zoster without mention of complication   . Hyperlipidemia   . Hypertension   . Hyperuricemia   . Impacted cerumen   .  Migraine   . Migraine   . Murmur   . Olecranon bursitis   . OSA (obstructive sleep apnea)   . Other and unspecified hyperlipidemia   . Other malaise and fatigue   . Peripheral neuropathy   . Polyneuropathy   . Pulmonary hypertension ([place])   . Unspecified hereditary and idiopathic peripheral neuropathy   . Venous insufficiency     Past Surgical History:  Procedure Laterality Date  . ANGIOPLASTY     w/ 2 stents in 1999  . APPENDECTOMY    . HEMORRHOID SURGERY    . LIPOMA EXCISION    . TONSILLECTOMY      Social History   Socioeconomic History  . Marital status: Married    Spouse name: [name]  . Number of children: 2  . Years of education: college  . Highest education  level: Not on file  Occupational History  . 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 Smoking status: Never Smoker  . Smokeless tobacco: Never Used  Vaping Use  . Vaping Use: Never used  Substance and Sexual Activity  . Alcohol use: Yes    Alcohol/week: 0.0 standard drinks    Comment: 2 drink before dinner vodka   . Drug use: No  . Sexual activity: Never  Other Topics Concern  . Not on file  Social History Narrative   Pt is married and lives with wife (suzanne). Patient finished  two years of college.    Pt has children.   Caffeine -two cups daily.   Left handed.   Retired Licensed conveyancer                    Social Determinants of Radio broadcast assistant Strain: [place]   . Difficulty of Paying Living Expenses: Not hard at all  Food Insecurity: No Food Insecurity  . Worried About Charity fundraiser in the Last Year: Never true  . Ran Out of Food in the Last Year: Never true  Transportation Needs: No Transportation Needs  . Lack of Transportation (Medical): No  . Lack of Transportation (Non-Medical): No  Physical Activity: Sufficiently Active  . Days of Exercise per Week: 5 days  . Minutes of Exercise per Session: 30 min  Stress: No Stress Concern Present  . Feeling of Stress : Not at all  Social Connections: Socially Integrated  . Frequency of Communication with Friends and Family: More than three times a week  . Frequency of Social Gatherings with Friends and Family: More than three times a week  . Attends Religious Services: More than 4 times per year  . Active Member of Clubs or Organizations: Yes  . Attends Archivist Meetings: More than 4 times per year  . Marital Status: Married    Family History  Problem Relation Age of Onset  . Stroke Father        Died, 83  . High blood pressure Father   . Diabetes Father   . Other Mother        Died, 88  . Diabetes Brother        Died, 82   . Diabetes Sister        Living, 63  . Healthy Daughter   . Healthy Son     Review of Systems     Objective:   Vitals:   02/25/21 1331  BP: 140/70  Pulse: (!) 59  Temp: 98 F (36.7 C)  SpO2: 93%   BP Readings from Last 3 Encounters:  02/25/21 140/70  02/19/21 130/64  01/16/21 120/70   Wt Readings from Last 3 Encounters:  02/25/21 148 lb (67.1 kg)  02/19/21 148 lb 3.2 oz (67.2 kg)  01/16/21 155 lb 6.4 oz (70.5 kg)   Body mass index is 23.89 kg/m.   Physical Exam         Assessment & Plan:    See Problem List for Assessment and Plan of chronic medical problems.    This visit occurred during the SARS-CoV-2 public health emergency.  Safety protocols were in place, including screening questions prior to the visit, additional usage of staff PPE, and extensive cleaning of exam room while observing appropriate contact time as indicated for disinfecting solutions.

## 2021-02-25 ENCOUNTER — Other Ambulatory Visit: Payer: Self-pay

## 2021-02-25 ENCOUNTER — Encounter: Payer: Self-pay | Admitting: Internal Medicine

## 2021-02-25 ENCOUNTER — Ambulatory Visit (INDEPENDENT_AMBULATORY_CARE_PROVIDER_SITE_OTHER): Payer: Medicare Other | Admitting: Internal Medicine

## 2021-02-25 VITALS — BP 140/70 | HR 59 | Temp 98.0°F | Ht 66.0 in | Wt 148.0 lb

## 2021-02-25 DIAGNOSIS — I2510 Atherosclerotic heart disease of native coronary artery without angina pectoris: Secondary | ICD-10-CM

## 2021-02-25 DIAGNOSIS — M25511 Pain in right shoulder: Secondary | ICD-10-CM

## 2021-02-25 DIAGNOSIS — M25512 Pain in left shoulder: Secondary | ICD-10-CM

## 2021-02-25 DIAGNOSIS — G8929 Other chronic pain: Secondary | ICD-10-CM

## 2021-02-25 NOTE — Patient Instructions (Signed)
   Schedule an appointment with Dr Georgina Snell downstairs for your bilateral shoulder pain for bilateral shoulder injections.   [a referral was ordered if needed, but you saw Dr Georgina Snell last year]

## 2021-02-25 NOTE — Assessment & Plan Note (Signed)
Chronic Wants b/l injections again - was not sure if he needed a referral again for sport med - has seen Dr Tamala Julian and Dr Georgina Snell Will refer back to Dr Georgina Snell for b/l injections

## 2021-02-26 ENCOUNTER — Ambulatory Visit: Payer: Medicare Other

## 2021-02-28 ENCOUNTER — Ambulatory Visit: Payer: Medicare Other

## 2021-03-02 ENCOUNTER — Ambulatory Visit
Admission: RE | Admit: 2021-03-02 | Discharge: 2021-03-02 | Disposition: A | Discharge status: Home / Self Care | Payer: Medicare Other | Source: Ambulatory Visit | Attending: Internal Medicine | Admitting: Internal Medicine

## 2021-03-02 ENCOUNTER — Other Ambulatory Visit: Payer: Self-pay | Admitting: Internal Medicine

## 2021-03-02 ENCOUNTER — Other Ambulatory Visit: Payer: Self-pay

## 2021-03-02 DIAGNOSIS — R634 Abnormal weight loss: Secondary | ICD-10-CM

## 2021-03-02 DIAGNOSIS — N200 Calculus of kidney: Secondary | ICD-10-CM | POA: Diagnosis not present

## 2021-03-02 DIAGNOSIS — K8080 Other cholelithiasis without obstruction: Secondary | ICD-10-CM | POA: Diagnosis not present

## 2021-03-02 DIAGNOSIS — K8020 Calculus of gallbladder without cholecystitis without obstruction: Secondary | ICD-10-CM | POA: Diagnosis not present

## 2021-03-02 DIAGNOSIS — K5730 Diverticulosis of large intestine without perforation or abscess without bleeding: Secondary | ICD-10-CM | POA: Diagnosis not present

## 2021-03-02 DIAGNOSIS — N281 Cyst of kidney, acquired: Secondary | ICD-10-CM | POA: Diagnosis not present

## 2021-03-05 NOTE — Progress Notes (Signed)
I, Peterson Lombard, LAT, ATC acting as a scribe for Lynne Leader, MD.  Subjective:    I'm seeing this patient as a consultation for: Dr. Billey Gosling. Note will be routed back to referring provider/PCP.  CC: Bilateral shoulder pain  HPI: Pt is a 87 y/o male c/o continued bilat shoulder pain. Pt has previously tried PT w/ little-to-no benefit/relief. Pt is L-hand dominate. Pt was last seen by Dr. Georgina Snell on 01/30/20 and was given bilat [place] joint steroid injections. Today, pt reports he a dx of arthritis. Pt reports increased pain at night while side-lying. Pt notes relief from prior steroid injections. Pt locates pain to all over [facility] joint.   Past medical history, Surgical history, Family history, Social history, Allergies, and medications have been entered into the medical record, reviewed.   Review of Systems: No new headache, visual changes, nausea, vomiting, diarrhea, constipation, dizziness, abdominal pain, skin rash, fevers, chills, night sweats, weight loss, swollen lymph nodes, body aches, joint swelling, muscle aches, chest pain, shortness of breath, mood changes, visual or auditory hallucinations.   Objective:    Vitals:   03/06/21 1242  BP: 130/78  Pulse: (!) 55  SpO2: 93%   General: Well Developed, well nourished, and in no acute distress.  Neuro/Psych: Alert and oriented x3, extra-ocular muscles intact, able to move all 4 extremities, sensation grossly intact. Skin: Warm and dry, no rashes noted.  Respiratory: Not using accessory muscles, speaking in full sentences, trachea midline.  Cardiovascular: Pulses palpable, no extremity edema. Abdomen: Does not appear distended. MSK: Bilateral shoulders decreased muscle bulk and deltoids.  Significant decreased range of motion with palpable squeaks.  Lab and Radiology Results  Procedure: Real-time Ultrasound Guided Injection of right shoulder glenohumeral joint posterior approach posterior approach Device: Philips Affiniti 50G Images  permanently stored and available for review in PACS Verbal informed consent obtained.  Discussed risks and benefits of procedure. Warned about infection bleeding damage to structures skin hypopigmentation and fat atrophy among others. Patient expresses understanding and agreement Time-out conducted.   Noted no overlying erythema, induration, or other signs of local infection.   Skin prepped in a sterile fashion.   Local anesthesia: Topical Ethyl chloride.   With sterile technique and under real time ultrasound guidance:  40 mg of Kenalog and 2 mL of Marcaine injected into joint. Fluid seen entering the joint capsule.   Completed without difficulty   Pain immediately resolved suggesting accurate placement of the medication.   Advised to call if fevers/chills, erythema, induration, drainage, or persistent bleeding.   Images permanently stored and available for review in the ultrasound unit.  Impression: Technically successful ultrasound guided injection.     Procedure: Real-time Ultrasound Guided Injection of left shoulder glenohumeral joint posterior approach Device: Philips Affiniti 50G Images permanently stored and available for review in PACS Verbal informed consent obtained.  Discussed risks and benefits of procedure. Warned about infection bleeding damage to structures skin hypopigmentation and fat atrophy among others. Patient expresses understanding and agreement Time-out conducted.   Noted no overlying erythema, induration, or other signs of local infection.   Skin prepped in a sterile fashion.   Local anesthesia: Topical Ethyl chloride.   With sterile technique and under real time ultrasound guidance:  40 mg of Kenalog and 2 mL of Marcaine injected into joint. Fluid seen entering the joint capsule.   Completed without difficulty   Pain immediately resolved suggesting accurate placement of the medication.   Advised to call if fevers/chills, erythema, induration, drainage,  or  persistent bleeding.   Images permanently stored and available for review in the ultrasound unit.  Impression: Technically successful ultrasound guided injection.    X-ray images bilateral shoulder obtained today personally and independently interpreted  Right shoulder: Severe end-stage osteoarthritis  Left shoulder: Severe end-stage osteoarthritis  Await formal radiology review   Impression and Recommendations:    Assessment and Plan: 87 y.o. male with bilateral shoulder pain due to significant DJD.  Ideally this should be treated with shoulder replacement however patient is 87 years old and not an ideal surgical candidate.  Plan for steroid injection today.  Last injection was over a year ago.  We can do this every 3 months.  Patient would like to be proactively scheduled for 3 months from now for follow-up and potential repeat injection.Marland Kitchen  PDMP not reviewed this encounter. Orders Placed This Encounter  Procedures  . [name] LIMITED JOINT SPACE STRUCTURES UP BILAT(NO LINKED CHARGES)    Standing Status:   Future    Number of Occurrences:   1    Standing Expiration Date:   09/05/2021    Order Specific Question:   Reason for Exam (SYMPTOM  OR DIAGNOSIS REQUIRED)    Answer:   bilateral shoulder pain    Order Specific Question:   Preferred imaging location?    Answer:   [facility]  . DG Shoulder Left    Standing Status:   Future    Standing Expiration Date:   03/06/2022    Order Specific Question:   Reason for Exam (SYMPTOM  OR DIAGNOSIS REQUIRED)    Answer:   bilateral shoulder pain    Order Specific Question:   Preferred imaging location?    Answer:   [facility]  . DG Shoulder Right    Standing Status:   Future    Standing Expiration Date:   03/06/2022    Order Specific Question:   Reason for Exam (SYMPTOM  OR DIAGNOSIS REQUIRED)    Answer:   bilateral shoulder pain    Order Specific Question:   Preferred imaging location?    Answer:   [facility]   No orders of the defined types were placed in this encounter.   Discussed warning signs or symptoms. Please see discharge instructions. Patient expresses understanding.   The above documentation has been reviewed and is accurate and complete Lynne Leader, M.D.

## 2021-03-06 ENCOUNTER — Ambulatory Visit (INDEPENDENT_AMBULATORY_CARE_PROVIDER_SITE_OTHER): Payer: Medicare Other | Admitting: Family Medicine

## 2021-03-06 ENCOUNTER — Other Ambulatory Visit: Payer: Self-pay

## 2021-03-06 ENCOUNTER — Ambulatory Visit: Payer: Self-pay

## 2021-03-06 ENCOUNTER — Ambulatory Visit (INDEPENDENT_AMBULATORY_CARE_PROVIDER_SITE_OTHER): Payer: Medicare Other

## 2021-03-06 VITALS — BP 130/78 | HR 55 | Ht 66.0 in | Wt 152.0 lb

## 2021-03-06 DIAGNOSIS — I2510 Atherosclerotic heart disease of native coronary artery without angina pectoris: Secondary | ICD-10-CM

## 2021-03-06 DIAGNOSIS — G8929 Other chronic pain: Secondary | ICD-10-CM

## 2021-03-06 DIAGNOSIS — M25511 Pain in right shoulder: Secondary | ICD-10-CM

## 2021-03-06 DIAGNOSIS — M19011 Primary osteoarthritis, right shoulder: Secondary | ICD-10-CM | POA: Diagnosis not present

## 2021-03-06 DIAGNOSIS — M25512 Pain in left shoulder: Secondary | ICD-10-CM | POA: Diagnosis not present

## 2021-03-06 DIAGNOSIS — M19012 Primary osteoarthritis, left shoulder: Secondary | ICD-10-CM | POA: Diagnosis not present

## 2021-03-06 NOTE — Patient Instructions (Signed)
Thank you for coming in today.  Schedule a recheck 3 months. We can do the injection then.   Please get an Xray today before you leave  Call or go to the ER if you develop a large red swollen joint with extreme pain or oozing puss.

## 2021-03-07 NOTE — Progress Notes (Signed)
Right shoulder x-ray shows severe arthritis.

## 2021-03-07 NOTE — Progress Notes (Signed)
Subjective:    Patient ID: Paul Mays, male    DOB: 17-Jul-1928, 87 y.o.   MRN: [id number]  HPI The patient is here for follow up of his weight loss  He has gained a little weight since he was here last.  He has been eating better.  He is trying to eat more as well.  He does have a wound on his right lower leg that he wanted to have looked at.  It does not seem to be healing.  There was minimal trauma.  He was worried about infection.  Medications and allergies reviewed with patient and updated if appropriate.  Patient Active Problem List   Diagnosis Date Noted  . Weight loss 02/19/2021  . Lightheadedness 02/19/2021  . Periumbilical pain 34/28/7681  . Hand laceration 11/06/2020  . Fall 08/21/2020  . Head trauma, initial encounter 08/21/2020  . Bilateral leg edema 01/26/2020  . GERD (gastroesophageal reflux disease) 01/26/2020  . Chronic pain of both shoulders 12/10/2018  . Easy bruising 03/20/2018  . Decreased hearing of right ear 09/04/2017  . Rotator cuff tear arthropathy of both shoulders 10/18/2015  . Thrombocytopenia ([place]) 09/24/2015  . Diverticulosis of colon without hemorrhage 07/02/2014  . Hypertrophic cardiomyopathy ([place]) 03/24/2011  . OBSTRUCTIVE SLEEP APNEA 11/08/2010  . HYPERTENSION, PULMONARY 09/16/2010  . HEARING LOSS, BILATERAL 06/18/2009  . Coronary atherosclerosis 03/07/2009  . Aortic valve disorder, mod AR, mod AS 03/07/2009  . CAROTID STENOSIS 03/07/2009  . Hyperlipidemia 11/15/2008  . Peripheral neuropathy, idiopathic 11/15/2008  . Essential hypertension 11/15/2008  . ARTHRALGIA 11/15/2008  . COLITIS, HX OF 11/15/2008  . VENOUS INSUFFICIENCY 04/20/2007  . Gout 02/10/2007    Current Outpatient Medications on File Prior to Visit  Medication Sig Dispense Refill  . allopurinol (ZYLOPRIM) 300 MG tablet Take 300 mg by mouth daily.    Marland Kitchen aspirin EC 81 MG tablet Take 1 tablet (81 mg total) by mouth daily.    . furosemide (LASIX) 20 MG tablet Take 1  tablet (20 mg total) by mouth daily. 90 tablet 1  . gabapentin (NEURONTIN) 600 MG tablet Take 1 tablet (600 mg total) by mouth 2 (two) times daily. 60 tablet 0  . hydrocortisone 2.5 % cream Apply topically 2 (two) times daily.    Marland Kitchen losartan (COZAAR) 25 MG tablet Take 1 tablet (25 mg total) by mouth daily. 90 tablet 1  . metoprolol (LOPRESSOR) 50 MG tablet Take 25 mg by mouth 2 (two) times daily. 1/2 tab by mouth two times a day.    Marland Kitchen omeprazole (PRILOSEC) 20 MG capsule TAKE 2 CAPSULES DAILY FOR 1 MONTH, THEN 1 CAPSULE DAILY 60 capsule 3  . pravastatin (PRAVACHOL) 40 MG tablet Take 40 mg by mouth daily.    . traMADol (ULTRAM) 50 MG tablet Take 1 tablet (50 mg total) by mouth every 12 (twelve) hours as needed for moderate pain (for chronic shoulder pain). 60 tablet 1   No current facility-administered medications on file prior to visit.    Past Medical History:  Diagnosis Date  . Anxiety   . Aortic stenosis   . Arthralgia   . Arthritis   . CAD (coronary artery disease)   . Carotid stenosis   . Cervicalgia   . CLUSTER HEADACHE 02/10/2007   Qualifier: Diagnosis of  By: Linna Darner MD, Gwyndolyn Saxon    . Colitis   . Edema   . Gout   . Hearing loss   . Herpes zoster without mention of complication   .  Hyperlipidemia   . Hypertension   . Hyperuricemia   . Impacted cerumen   . Migraine   . Migraine   . Murmur   . Olecranon bursitis   . OSA (obstructive sleep apnea)   . Other and unspecified hyperlipidemia   . Other malaise and fatigue   . Peripheral neuropathy   . Polyneuropathy   . Pulmonary hypertension ([place])   . Unspecified hereditary and idiopathic peripheral neuropathy   . Venous insufficiency     Past Surgical History:  Procedure Laterality Date  . ANGIOPLASTY     w/ 2 stents in 1999  . APPENDECTOMY    . HEMORRHOID SURGERY    . LIPOMA EXCISION    . TONSILLECTOMY      Social History   Socioeconomic History  . Marital status: Married    Spouse name: [name]  . Number of  children: 2  . Years of education: college  . Highest education level: Not on file  Occupational History  . 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 Smoking status: Never Smoker  . Smokeless tobacco: Never Used  Vaping Use  . Vaping Use: Never used  Substance and Sexual Activity  . Alcohol use: Yes    Alcohol/week: 0.0 standard drinks    Comment: 2 drink before dinner vodka   . Drug use: No  . Sexual activity: Never  Other Topics Concern  . Not on file  Social History Narrative   Pt is married and lives with wife (suzanne). Patient finished  two years of college.    Pt has children.   Caffeine -two cups daily.   Left handed.   Retired Licensed conveyancer                    Social Determinants of Radio broadcast assistant Strain: [place]   . Difficulty of Paying Living Expenses: Not hard at all  Food Insecurity: No Food Insecurity  . Worried About Charity fundraiser in the Last Year: Never true  . Ran Out of Food in the Last Year: Never true  Transportation Needs: No Transportation Needs  . Lack of Transportation (Medical): No  . Lack of Transportation (Non-Medical): No  Physical Activity: Sufficiently Active  . Days of Exercise per Week: 5 days  . Minutes of Exercise per Session: 30 min  Stress: No Stress Concern Present  . Feeling of Stress : Not at all  Social Connections: Socially Integrated  . Frequency of Communication with Friends and Family: More than three times a week  . Frequency of Social Gatherings with Friends and Family: More than three times a week  . Attends Religious Services: More than 4 times per year  . Active Member of Clubs or Organizations: Yes  . Attends Archivist Meetings: More than 4 times per year  . Marital Status: Married    Family History  Problem Relation Age of Onset  . Stroke Father        Died, 83  . High blood pressure Father   . Diabetes Father   .  Other Mother        Died, 88  . Diabetes Brother        Died, 82  . Diabetes Sister        Living, 28  . Healthy Daughter   . Healthy Son     Review of Systems  Constitutional:  Negative for appetite change, chills and fever.  Respiratory: Negative for cough, shortness of breath and wheezing.   Cardiovascular: Positive for leg swelling. Negative for chest pain and palpitations.  Musculoskeletal: Positive for arthralgias.  Skin: Positive for wound.       Objective:   Vitals:   03/08/21 1418  BP: 130/80  Pulse: 60  Temp: 98.5 F (36.9 C)  SpO2: 97%   BP Readings from Last 3 Encounters:  03/08/21 130/80  03/06/21 130/78  02/25/21 140/70   Wt Readings from Last 3 Encounters:  03/08/21 152 lb 12.8 oz (69.3 kg)  03/06/21 152 lb (68.9 kg)  02/25/21 148 lb (67.1 kg)   Body mass index is 24.66 kg/m.   Physical Exam    Constitutional: Appears well-developed and well-nourished. No distress.  HENT:  Head: Normocephalic and atraumatic.  Neck: Neck supple. No tracheal deviation present. No thyromegaly present.  No cervical lymphadenopathy Cardiovascular: Normal rate, regular rhythm and normal heart sounds.   No murmur heard. No carotid bruit .  No edema Pulmonary/Chest: Effort normal and breath sounds normal. No respiratory distress. No has no wheezes. No rales.  Skin: Skin is warm and dry. Not diaphoretic.  Nickel sized ulcer proximal right anterior lower leg without erythema, discharge.  Mild healthy looking granulation tissue.  Minimal tenderness with palpation around ulcer Psychiatric: Normal mood and affect. Behavior is normal.   DG Shoulder Right CLINICAL DATA:  Pain  EXAM: RIGHT SHOULDER - 2+ VIEW  COMPARISON:  None.  FINDINGS: Oblique, Y scapular, axillary images were obtained. No acute fracture or dislocation is evident. There is marked narrowing of the glenohumeral joint with moderate narrowing of the acromioclavicular joint. There is remodeling along the  inferior glenoid as well as along the inferior aspect of the humeral head. There are several foci of intra-articular calcification. No frank erosions. Visualized right lung clear.  IMPRESSION: Extensive osteoarthritic change, most severe in the glenohumeral joint. Intra-articular calcification may have arthropathic etiology. This finding potentially could represent residua of prior trauma as well. No erosions. No acute fracture or dislocation evident.  Electronically Signed   By: Lowella Grip III M.D.   On: 03/07/2021 08:07 DG Shoulder Left CLINICAL DATA:  Bilateral shoulder pain.  EXAM: LEFT SHOULDER - 2+ VIEW  COMPARISON:  Prior shoulder series report 12/09/2011.  FINDINGS: Severe acromioclavicular glenohumeral degenerative change. Loose bodies noted. Calcific supraspinatus tendinosis cannot be excluded. No evidence of fracture or dislocation. No evidence of separation.  IMPRESSION: Severe acromioclavicular glenohumeral degenerative change with loose bodies. Calcific supraspinatus tendinosis cannot be excluded. No acute abnormality identified.  Electronically Signed   By: Marcello Moores  Register   On: 03/07/2021 08:05   Lab Results  Component Value Date   WBC 4.8 02/19/2021   HGB 12.9 (L) 02/19/2021   HCT 38.5 (L) 02/19/2021   PLT 115.0 (L) 02/19/2021   GLUCOSE 88 02/19/2021   CHOL 149 12/09/2017   TRIG 57.0 12/09/2017   HDL 72.30 12/09/2017   LDLCALC 65 12/09/2017   ALT 14 02/19/2021   AST 16 02/19/2021   NA 143 02/19/2021   K 5.0 02/19/2021   CL 106 02/19/2021   CREATININE 1.03 02/19/2021   BUN 25 (H) 02/19/2021   CO2 31 02/19/2021   TSH 2.34 02/19/2021   PSA 0.00 (L) 02/19/2021   INR 0.9 03/19/2018   HGBA1C 5.1 09/24/2015    Reviewed recent CT scan of abdomen and pelvis  Assessment & Plan:    See Problem List for Assessment and Plan  of chronic medical problems.    This visit occurred during the SARS-CoV-2 public health emergency.  Safety  protocols were in place, including screening questions prior to the visit, additional usage of staff PPE, and extensive cleaning of exam room while observing appropriate contact time as indicated for disinfecting solutions.

## 2021-03-07 NOTE — Progress Notes (Signed)
Left shoulder x-ray shows severe arthritis

## 2021-03-08 ENCOUNTER — Ambulatory Visit (INDEPENDENT_AMBULATORY_CARE_PROVIDER_SITE_OTHER): Payer: Medicare Other | Admitting: Internal Medicine

## 2021-03-08 ENCOUNTER — Encounter: Payer: Self-pay | Admitting: Internal Medicine

## 2021-03-08 ENCOUNTER — Other Ambulatory Visit: Payer: Self-pay

## 2021-03-08 VITALS — BP 130/80 | HR 60 | Temp 98.5°F | Ht 66.0 in | Wt 152.8 lb

## 2021-03-08 DIAGNOSIS — I2510 Atherosclerotic heart disease of native coronary artery without angina pectoris: Secondary | ICD-10-CM

## 2021-03-08 DIAGNOSIS — R634 Abnormal weight loss: Secondary | ICD-10-CM | POA: Diagnosis not present

## 2021-03-08 DIAGNOSIS — L97909 Non-pressure chronic ulcer of unspecified part of unspecified lower leg with unspecified severity: Secondary | ICD-10-CM | POA: Insufficient documentation

## 2021-03-08 DIAGNOSIS — L97911 Non-pressure chronic ulcer of unspecified part of right lower leg limited to breakdown of skin: Secondary | ICD-10-CM | POA: Diagnosis not present

## 2021-03-08 DIAGNOSIS — I10 Essential (primary) hypertension: Secondary | ICD-10-CM

## 2021-03-08 MED ORDER — METOPROLOL TARTRATE 25 MG PO TABS: 25.0000 mg | ORAL_TABLET | Freq: Two times a day (BID) | ORAL | 3 refills | Status: DC

## 2021-03-08 NOTE — Assessment & Plan Note (Signed)
Chronic Blood pressure well controlled here today Continue furosemide 20 mg daily, losartan 25 mg daily, metoprolol 25 mg twice daily

## 2021-03-08 NOTE — Assessment & Plan Note (Signed)
Acute Right lower extremity ulcer-proximal anterior lower leg No evidence of infection Granulation tissue present Advised this will take a long time to heal Advised elevating legs when sitting, which she does do He will continue local wound care and monitor closely

## 2021-03-08 NOTE — Assessment & Plan Note (Signed)
Here for follow-up of weight loss He has gained 4 pounds since he was here recently He has been eating more and feels he is eating better Reviewed blood work, CT scan and chest x-ray-no concerning findings He will continue to monitor his weight and be more conscious of what he is eating and how much she is eating Will monitor weight No further evaluation necessary at this time

## 2021-03-08 NOTE — Patient Instructions (Addendum)
    Monitor your wound.  Call if any concerns.     See you in 6 months, sooner if needed.

## 2021-03-11 ENCOUNTER — Telehealth: Payer: Self-pay | Admitting: Internal Medicine

## 2021-03-11 NOTE — Telephone Encounter (Signed)
Patients son, Coralyn Mark called and is requesting a call back in regards to a recent CT scan. He also said that he would like to discuss the patients weight loss. Coralyn Mark said that he is the medical power of attorney. Please advise   Phone:  [phone]

## 2021-03-12 NOTE — Telephone Encounter (Signed)
Patient's son returned call

## 2021-03-12 NOTE — Telephone Encounter (Signed)
Message left for son to return call to clinic to discuss his concerns.

## 2021-03-14 ENCOUNTER — Encounter: Payer: Medicare Other | Admitting: Physical Therapy

## 2021-03-14 NOTE — Telephone Encounter (Signed)
Spoke with son today.

## 2021-03-19 ENCOUNTER — Encounter: Payer: Medicare Other | Admitting: Physical Therapy

## 2021-03-21 ENCOUNTER — Encounter: Payer: Medicare Other | Admitting: Physical Therapy

## 2021-03-26 ENCOUNTER — Encounter: Payer: Medicare Other | Admitting: Physical Therapy

## 2021-04-02 ENCOUNTER — Encounter: Payer: Medicare Other | Admitting: Physical Therapy

## 2021-04-05 ENCOUNTER — Ambulatory Visit: Payer: Self-pay

## 2021-04-05 NOTE — Chronic Care Management (AMB) (Signed)
  Care Management  Care Management Phone Note  04/05/2021 Name: KOLLEN ARMENTI MRN: [id number] DOB: 12/17/1927 SAFAL HALDERMAN is a 87 y.o. year old male who is a primary care patient of Burns, Claudina Lick, MD.   The Care Management team was consulted to assess patient's needs after disconnection with services provided by [facility].  Unsuccessful outreach was made by telephone today to assess patient care needs post discontinuation of [facility].   Patient confirmed receipt of in home care service discontinuation letter:   Plan: The care management team will attempt to reach patient again to assess care needs post [facility] discontinuation.    Tomasa Rand, RN, BSN, CEN RN Case Manager [phone]

## 2021-04-08 DIAGNOSIS — W5501XA Bitten by cat, initial encounter: Secondary | ICD-10-CM | POA: Diagnosis not present

## 2021-04-08 DIAGNOSIS — M79604 Pain in right leg: Secondary | ICD-10-CM | POA: Diagnosis not present

## 2021-04-08 DIAGNOSIS — Z6825 Body mass index (BMI) 25.0-25.9, adult: Secondary | ICD-10-CM | POA: Diagnosis not present

## 2021-04-09 ENCOUNTER — Telehealth: Payer: Self-pay | Admitting: Internal Medicine

## 2021-04-09 NOTE — Telephone Encounter (Signed)
[facility] FYI 7.4.22:  ---Caller has a bad bruise on his right leg and his dog was playing 430 am and bit the bruise and it has been bleeding non stop since then  Advised to go to ED now.

## 2021-04-15 NOTE — Progress Notes (Signed)
Subjective:    Patient ID: Paul Mays, male    DOB: 1928-08-24, 87 y.o.   MRN: [id number]  HPI The patient is here for an acute visit.  04/09/21:  he had a bruise on his right leg and his cat  was playing and bit the bruise and was it start bleeding a lot - he called here and we advised ED evaluation.   He got 4 stitches that need to be removed.  No pain. No fever/chills.   No bleeding.  He just completed an antibiotic.  He has not changed the bandage since he go the stitches.     Medications and allergies reviewed with patient and updated if appropriate.  Patient Active Problem List   Diagnosis Date Noted   Cat scratch of right lower leg 04/16/2021   Lower extremity ulceration ([place]) 03/08/2021   Weight loss 02/19/2021   Lightheadedness 78/93/8101   Periumbilical pain 75/07/2584   Hand laceration 11/06/2020   Fall 08/21/2020   Head trauma, initial encounter 08/21/2020   Bilateral leg edema 01/26/2020   GERD (gastroesophageal reflux disease) 01/26/2020   Chronic pain of both shoulders 12/10/2018   Easy bruising 03/20/2018   Decreased hearing of right ear 09/04/2017   Rotator cuff tear arthropathy of both shoulders 10/18/2015   Thrombocytopenia ([place]) 09/24/2015   Diverticulosis of colon without hemorrhage 07/02/2014   Hypertrophic cardiomyopathy ([place]) 03/24/2011   OBSTRUCTIVE SLEEP APNEA 11/08/2010   HYPERTENSION, PULMONARY 09/16/2010   HEARING LOSS, BILATERAL 06/18/2009   Coronary atherosclerosis 03/07/2009   Aortic valve disorder, mod AR, mod AS 03/07/2009   CAROTID STENOSIS 03/07/2009   Hyperlipidemia 11/15/2008   Peripheral neuropathy, idiopathic 11/15/2008   Essential hypertension 11/15/2008   ARTHRALGIA 11/15/2008   COLITIS, HX OF 11/15/2008   VENOUS INSUFFICIENCY 04/20/2007   Gout 02/10/2007    Current Outpatient Medications on File Prior to Visit  Medication Sig Dispense Refill   allopurinol (ZYLOPRIM) 300 MG tablet Take 300 mg by mouth daily.     aspirin  EC 81 MG tablet Take 1 tablet (81 mg total) by mouth daily.     furosemide (LASIX) 20 MG tablet Take 1 tablet (20 mg total) by mouth daily. 90 tablet 1   gabapentin (NEURONTIN) 600 MG tablet Take 1 tablet (600 mg total) by mouth 2 (two) times daily. 60 tablet 0   hydrocortisone 2.5 % cream Apply topically 2 (two) times daily.     losartan (COZAAR) 25 MG tablet Take 1 tablet (25 mg total) by mouth daily. 90 tablet 1   metoprolol tartrate (LOPRESSOR) 25 MG tablet Take 1 tablet (25 mg total) by mouth 2 (two) times daily. 180 tablet 3   omeprazole (PRILOSEC) 20 MG capsule TAKE 2 CAPSULES DAILY FOR 1 MONTH, THEN 1 CAPSULE DAILY 60 capsule 3   pravastatin (PRAVACHOL) 40 MG tablet Take 40 mg by mouth daily.     traMADol (ULTRAM) 50 MG tablet Take 1 tablet (50 mg total) by mouth every 12 (twelve) hours as needed for moderate pain (for chronic shoulder pain). 60 tablet 1   No current facility-administered medications on file prior to visit.    Past Medical History:  Diagnosis Date   Anxiety    Aortic stenosis    Arthralgia    Arthritis    CAD (coronary artery disease)    Carotid stenosis    Cervicalgia    CLUSTER HEADACHE 02/10/2007   Qualifier: Diagnosis of  By: Linna Darner MD, Gwyndolyn Saxon     Colitis  Edema    Gout    Hearing loss    Herpes zoster without mention of complication    Hyperlipidemia    Hypertension    Hyperuricemia    Impacted cerumen    Migraine    Migraine    Murmur    Olecranon bursitis    OSA (obstructive sleep apnea)    Other and unspecified hyperlipidemia    Other malaise and fatigue    Peripheral neuropathy    Polyneuropathy    Pulmonary hypertension (HCC)    Unspecified hereditary and idiopathic peripheral neuropathy    Venous insufficiency     Past Surgical History:  Procedure Laterality Date   ANGIOPLASTY     w/ 2 stents in [place] History   Socioeconomic History    Marital status: Married    Spouse name: [name]   Number of children: 2   Years of education: college   Highest education level: Not on file  Occupational History   Occupation: retired    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 Patient finished  two years of college.    Pt has children.   Caffeine -two cups daily.   Left handed.   Retired Licensed conveyancer                    Social Determinants of Radio broadcast assistant Strain: Low Risk    Difficulty of Paying Living Expenses: Not hard at all  Food Insecurity: No Food Insecurity   Worried About Charity fundraiser in the Last Year: Never true   Arboriculturist in the Last Year: Never true  Transportation Needs: No Transportation Needs   Lack of Transportation (Medical): No   Lack of Transportation (Non-Medical): No  Physical Activity: Sufficiently Active   Days of Exercise per Week: 5 days   Minutes of Exercise per Session: 30 min  Stress: No Stress Concern Present   Feeling of Stress : Not at all  Social Connections: Socially Integrated   Frequency of Communication with Friends and Family: More than three times a week   Frequency of Social Gatherings with Friends and Family: More than three times a week   Attends Religious Services: More than 4 times per year   Active Member of [employer] or Organizations: Yes   Attends Music therapist: More than 4 times per year   Marital Status: Married    Family History  Problem Relation Age of Onset   Stroke Father        Died, 83   High blood pressure Father     Diabetes Father    Other Mother        Died, 40   Diabetes Brother        Died, 82   Diabetes Sister  Living, 6   Healthy Daughter    Healthy Son     Review of Systems     Objective:   Vitals:   04/16/21 1305  BP: 134/80  Pulse: 60  Temp: 98.5 F (36.9 C)  SpO2: 95%   BP Readings from Last 3 Encounters:  04/16/21 134/80  03/08/21 130/80  03/06/21 130/78   Wt Readings from Last 3 Encounters:  04/16/21 154 lb (69.9 kg)  03/08/21 152 lb 12.8 oz (69.3 kg)  03/06/21 152 lb (68.9 kg)   Body mass index is 24.86 kg/m.   Physical Exam Constitutional:      General: He is not in acute distress.    Appearance: Normal appearance. He is not ill-appearing.  HENT:     Head: Normocephalic and atraumatic.  Skin:    General: Skin is warm and dry.     Findings: Bruising (b/l UE and LE) present.     Comments: 2.5 inch laceration of right anterior lower leg with stitches - medial aspect closed and scabbed, lateral aspect with the stitches no completely healed - some oozing as bandage removed - no surrounding erythema or swelling  Neurological:     Mental Status: He is alert.           Assessment & Plan:   I spent 20  minutes dedicated to the care of this patient on the date of this encounter including obtaining history, removing stitches and redressing wound, communicating with the patient wound care instructions and signs of infection, and documenting clinical information in the EHR    See Problem List for Assessment and Plan of chronic medical problems.    This visit occurred during the SARS-CoV-2 public health emergency.  Safety protocols were in place, including screening questions prior to the visit, additional usage of staff PPE, and extensive cleaning of exam room while observing appropriate contact time as indicated for disinfecting solutions.

## 2021-04-16 ENCOUNTER — Ambulatory Visit (INDEPENDENT_AMBULATORY_CARE_PROVIDER_SITE_OTHER): Payer: Medicare Other | Admitting: Internal Medicine

## 2021-04-16 ENCOUNTER — Other Ambulatory Visit: Payer: Self-pay

## 2021-04-16 ENCOUNTER — Encounter: Payer: Self-pay | Admitting: Internal Medicine

## 2021-04-16 DIAGNOSIS — S80811A Abrasion, right lower leg, initial encounter: Secondary | ICD-10-CM | POA: Diagnosis not present

## 2021-04-16 DIAGNOSIS — W5503XA Scratched by cat, initial encounter: Secondary | ICD-10-CM | POA: Diagnosis not present

## 2021-04-16 DIAGNOSIS — I2510 Atherosclerotic heart disease of native coronary artery without angina pectoris: Secondary | ICD-10-CM

## 2021-04-16 NOTE — Patient Instructions (Addendum)
     Change your dressing in a couple of days.  Monitor the area closer.

## 2021-04-16 NOTE — Assessment & Plan Note (Signed)
Acute Occurred one week ago - got 4 stitches Removed 4 stitches with slight oozing of blood No evidence of infection  - completed antibiotic Wound bandaged He will change in 2 days He will monitor for infection and call with any questions Td up to date

## 2021-04-22 ENCOUNTER — Telehealth: Payer: Self-pay

## 2021-04-22 NOTE — Telephone Encounter (Signed)
  Care Management  Care Management Phone Note  04/22/2021 Name: Paul Mays MRN: [id number] DOB: 23-Dec-1927 Paul Mays is a 87 y.o. year old male who is a primary care patient of Burns, Claudina Lick, MD.   The Care Management team was consulted to assess patient's needs after disconnection with services provided by [facility].  Second unsuccessful outreach was made by telephone today to assess patient care needs post discontinuation of [facility].    Plan: The care management team will attempt to reach patient again to assess care needs post [facility] discontinuation.    Tomasa Rand, RN, BSN, CEN [facility] [employer] [phone]

## 2021-04-26 ENCOUNTER — Telehealth: Payer: Self-pay

## 2021-04-26 NOTE — Telephone Encounter (Signed)
  Care Management  Care Management Phone Note  04/26/2021 Name: Paul Mays MRN: [id number] DOB: 11/16/1927 [name] is a 87 y.o. year old male who is a primary care patient of Burns, Claudina Lick, MD.   The Care Management team was consulted to assess patient's needs after disconnection with services provided by [facility].  Successful outreach was made by telephone today to assess patient care needs post discontinuation of [facility].  Spoke with patient and he is interested in services for he and his wife.  Patient confirmed receipt of in home care service discontinuation letter: No  Plan: The patient expressed ongoing care management/care coordination service needs and will be contacted by the scheduling team for an appointment with the RN Care Manager.    Tomasa Rand, RN, BSN, CEN [facility] [employer] [phone]

## 2021-04-29 ENCOUNTER — Telehealth: Payer: Self-pay | Admitting: Pharmacist

## 2021-04-29 NOTE — Progress Notes (Signed)
    Chronic Care Management Pharmacy Assistant   Name: Paul Mays  MRN: [id number] DOB: 1927/12/12   Reason for Encounter: Disease State   Conditions to be addressed/monitored: General   Recent office visits:  02/19/21 Dr. Billey Gosling (PCP) Lightheadedness Blood work and a chest xray were ordered Medications changes include : Decrease losartan to 25 mg daily  02/25/21 Dr. Billey Gosling (PCP) Chronic pain of both shoulders Schedule an appointment with Dr Georgina Snell downstairs for your bilateral shoulder pain for bilateral shoulder injections 03/08/21 Dr. Billey Gosling (PCP) Weight loss  04/16/21 Dr. Billey Gosling (PCP) Cat scratch of right lower leg  Recent consult visits:  03/06/21 Dr. Lynne Leader Sports MedicineChronic pain of both shoulders Primary osteoarthritis of both shoulders Xray today  Hospital visits:  None in previous 6 months  Medications: Outpatient Encounter Medications as of 04/29/2021  Medication Sig   allopurinol (ZYLOPRIM) 300 MG tablet Take 300 mg by mouth daily.   aspirin EC 81 MG tablet Take 1 tablet (81 mg total) by mouth daily.   furosemide (LASIX) 20 MG tablet Take 1 tablet (20 mg total) by mouth daily.   gabapentin (NEURONTIN) 600 MG tablet Take 1 tablet (600 mg total) by mouth 2 (two) times daily.   hydrocortisone 2.5 % cream Apply topically 2 (two) times daily.   losartan (COZAAR) 25 MG tablet Take 1 tablet (25 mg total) by mouth daily.   metoprolol tartrate (LOPRESSOR) 25 MG tablet Take 1 tablet (25 mg total) by mouth 2 (two) times daily.   omeprazole (PRILOSEC) 20 MG capsule TAKE 2 CAPSULES DAILY FOR 1 MONTH, THEN 1 CAPSULE DAILY   pravastatin (PRAVACHOL) 40 MG tablet Take 40 mg by mouth daily.   traMADol (ULTRAM) 50 MG tablet Take 1 tablet (50 mg total) by mouth every 12 (twelve) hours as needed for moderate pain (for chronic shoulder pain).   No facility-administered encounter medications on file as of 04/29/2021.    Pharmacist Review  Have you had any  problems recently with your health? Patient states that he feels like he is doing ok. He did mention that he gets light headed whenever he is driving. He states that when he gets going he usually starts to feel ok.  Have you had any problems with your pharmacy? Patient states that he does not have any problems getting his medications from the pharmacy, he usually picks them up himself.   What issues or side effects are you having with your medications? Patient states that he is not sure if light headed is from medications or  not, he has been on them for a long time  What would you like me to pass along to [facility] for them to help you with?  Patient states that he is doing well, and does not have any complaints at this time  What can we do to take care of you better?  Patient states that right now he is doing well  Star Rating Drugs: Losartan 25 mg 02/14/21 90 ds   Paul Mays Clinical Pharmacist Assistant [phone]   Time spent:29

## 2021-05-08 ENCOUNTER — Telehealth: Payer: Self-pay | Admitting: *Deleted

## 2021-05-08 ENCOUNTER — Other Ambulatory Visit: Payer: Self-pay | Admitting: Internal Medicine

## 2021-05-08 NOTE — Chronic Care Management (AMB) (Signed)
  Chronic Care Management   Outreach Note  05/08/2021 Name: DEMAURY BARAHONA MRN: [id number] DOB: 04/13/1928  CHISTIAN BURRUEL is a 87 y.o. year old male who is a primary care patient of Burns, Claudina Lick, MD. I reached out to [name] by phone today in response to a referral sent by Mr. Amar Savio Wedge's PCP, Dr. Quay Burow.      An unsuccessful telephone outreach was attempted today. The patient was referred to the case management team for assistance with care management and care coordination.   Follow Up Plan: A HIPAA compliant phone message was left for the patient providing contact information and requesting a return call. The care management team will reach out to the patient again over the next 7-14 days.  If patient returns call to provider office, please advise to call [facility] at [phone].  [facility] Management  Direct Dial: [phone]

## 2021-05-09 ENCOUNTER — Telehealth: Payer: Medicare Other

## 2021-05-14 ENCOUNTER — Telehealth: Payer: Medicare Other

## 2021-05-14 ENCOUNTER — Telehealth: Payer: Self-pay | Admitting: Pharmacist

## 2021-05-14 NOTE — Telephone Encounter (Signed)
  Chronic Care Management   Outreach Note  05/14/2021 Name: Paul Mays MRN: [id number] DOB: [birth year]  Referred by: Binnie Rail, MD  Patient had a phone appointment scheduled with clinical pharmacist today.  An unsuccessful telephone outreach was attempted today. The patient was referred to the pharmacist for assistance with medications, care management and care coordination.   Patient will NOT be penalized in any way for missing a CCM appointment. The no-show fee does not apply.  If possible, a message was left to return call to: [phone] or to [facility]: [place], PharmD, Para March, CPP Clinical Pharmacist [facility] [phone]

## 2021-05-14 NOTE — Progress Notes (Deleted)
Chronic Care Management Pharmacy Note  05/14/2021 Name:  Paul Mays MRN:  [id number] DOB:  [birth year]  Summary: ***  Recommendations/Changes made from today's visit: ***  Plan: ***  Subjective: Paul Mays is an 87 y.o. year old male who is a primary patient of Burns, Claudina Lick, MD.  The CCM team was consulted for assistance with disease management and care coordination needs.    Engaged with patient by telephone for follow up visit in response to provider referral for pharmacy case management and/or care coordination services.   Consent to Services:  The patient was given information about Chronic Care Management services, agreed to services, and gave verbal consent prior to initiation of services.  Please see initial visit note for detailed documentation.   Patient Care Team: Binnie Rail, MD as PCP - General (Internal Medicine) Sherren Mocha, MD as PCP - Cardiology (Cardiology) Charlton Haws, [facility] as Pharmacist (Pharmacist)   Patient lives at home with wife who has dementia. He takes care of his medications and hers.  Recent office visits: 04/16/21 Dr Quay Burow OV: f/u cat bite, s/p 4 stitches. Removed stitches.  03/08/21 Dr Quay Burow OV: f/u wt loss. Gained 4 lbs. No changes  02/25/21 Dr Quay Burow OV: shoulder pain. Referred to sports med.  02/19/21 Dr Quay Burow OV: c/o lightheadedness. Wt loss. Decreased losartan to 25 mg daily.  11/06/20 Dr Quay Burow OV: acute visit for hand pain after fall. Dermabond applied.   08/21/20 Dr Quay Burow OV: acute visit for fall w/ bump on head. No need for imaging since 2 weeks ago  Recent consult visits: 03/06/21 Dr Georgina Snell (sports med): bilateral shoulder pain. Steroid injection. 10/02/20 Dr Ronnald Ramp (dermatology): f/u seborrheic dermatitis  [facility]  Hospital visits: None in previous 6 months  Objective:  Lab Results  Component Value Date   CREATININE 1.03 02/19/2021   BUN 25 (H) 02/19/2021   GFR 63.61 02/19/2021   GFRNONAA 50  (L) 06/25/2020   GFRAA 58 (L) 06/25/2020   NA 143 02/19/2021   K 5.0 02/19/2021   CALCIUM 9.7 02/19/2021   CO2 31 02/19/2021   GLUCOSE 88 02/19/2021    Lab Results  Component Value Date/Time   HGBA1C 5.1 09/24/2015 11:08 AM   HGBA1C 5.3 01/27/2011 04:55 PM   GFR 63.61 02/19/2021 02:34 PM   GFR 46.81 (L) 02/28/2020 03:06 PM    Last diabetic Eye exam: No results found for: HMDIABEYEEXA  Last diabetic Foot exam: No results found for: HMDIABFOOTEX   Lab Results  Component Value Date   CHOL 149 12/09/2017   HDL 72.30 12/09/2017   LDLCALC 65 12/09/2017   TRIG 57.0 12/09/2017   CHOLHDL 2 12/09/2017    Hepatic Function Latest Ref Rng & Units 02/19/2021 06/25/2020 03/19/2018  Total Protein 6.0 - 8.3 g/dL 6.0 5.4(L) 6.1  Albumin 3.5 - 5.2 g/dL 4.2 - 4.0  AST 0 - 37 U/L 16 14 20   ALT 0 - 53 U/L 14 12 21   Alk Phosphatase 39 - 117 U/L 31(L) - 27(L)  Total Bilirubin 0.2 - 1.2 mg/dL 2.3(H) 1.5(H) 1.8(H)  Bilirubin, Direct 0.0 - 0.3 mg/dL - - -    Lab Results  Component Value Date/Time   TSH 2.34 02/19/2021 02:34 PM   TSH 1.97 09/24/2015 11:08 AM    CBC Latest Ref Rng & Units 02/19/2021 06/25/2020 03/19/2018  WBC 4.0 - 10.5 K/uL 4.8 5.1 5.1  Hemoglobin 13.0 - 17.0 g/dL 12.9(L) 12.1(L) 13.6  Hematocrit 39.0 - 52.0 % 38.5(L)  36.7(L) 39.8  Platelets 150.0 - 400.0 K/uL 115.0(L) 121(L) 101.0(L)    No results found for: VD25OH  Clinical ASCVD: Yes  The ASCVD Risk score Mikey Bussing DC Jr., et al., 2013) failed to calculate for the following reasons:   The 2013 ASCVD risk score is only valid for ages 22 to 30    Depression screen PHQ 2/9 01/16/2021 08/21/2020 08/17/2019  Decreased Interest 0 0 0  Down, Depressed, Hopeless 0 0 0  PHQ - 2 Score 0 0 0  Altered sleeping - - -  Tired, decreased energy - - -  Change in appetite - - -  Feeling bad or failure about yourself  - - -  Trouble concentrating - - -  Moving slowly or fidgety/restless - - -  Suicidal thoughts - - -  PHQ-9 Score - - -   Difficult doing work/chores - - -  Some recent data might be hidden     Social History   Tobacco Use  Smoking Status Never  Smokeless Tobacco Never   BP Readings from Last 3 Encounters:  04/16/21 134/80  03/08/21 130/80  03/06/21 130/78   Pulse Readings from Last 3 Encounters:  04/16/21 60  03/08/21 60  03/06/21 (!) 55   Wt Readings from Last 3 Encounters:  04/16/21 154 lb (69.9 kg)  03/08/21 152 lb 12.8 oz (69.3 kg)  03/06/21 152 lb (68.9 kg)   BMI Readings from Last 3 Encounters:  04/16/21 24.86 kg/m  03/08/21 24.66 kg/m  03/06/21 24.53 kg/m    Assessment/Interventions: Review of patient past medical history, allergies, medications, health status, including review of consultants reports, laboratory and other test data, was performed as part of comprehensive evaluation and provision of chronic care management services.   SDOH:  (Social Determinants of Health) assessments and interventions performed: Yes   SDOH Screenings   Alcohol Screen: Low Risk    Last Alcohol Screening Score (AUDIT): 4  Depression (PHQ2-9): Low Risk    PHQ-2 Score: 0  Financial Resource Strain: Low Risk    Difficulty of Paying Living Expenses: Not hard at all  Food Insecurity: No Food Insecurity   Worried About Charity fundraiser in the Last Year: Never true   Ran Out of Food in the Last Year: Never true  Housing: Low Risk    Last Housing Risk Score: 0  Physical Activity: Sufficiently Active   Days of Exercise per Week: 5 days   Minutes of Exercise per Session: 30 min  Social Connections: Engineer, building services of Communication with Friends and Family: More than three times a week   Frequency of Social Gatherings with Friends and Family: More than three times a week   Attends Religious Services: More than 4 times per year   Active Member of [employer] or Organizations: Yes   Attends Music therapist: More than 4 times per year   Marital Status: Married  Stress: No  Stress Concern Present   Feeling of Stress : Not at all  Tobacco Use: Low Risk    Smoking Tobacco Use: Never   Smokeless Tobacco Use: Never  Transportation Needs: No Transportation Needs   Lack of Transportation (Medical): No   Lack of Transportation (Non-Medical): No    CCM Care Plan  Allergies  Allergen Reactions   Lisinopril Other (See Comments)    Hyperkalemia in the context of combined spironolactone and lisinopril therapy   Spironolactone Other (See Comments)    Hyperkalemia in context of spironolactone and lisinopril  therapy    Medications Reviewed Today     Reviewed by Binnie Rail, MD (Physician) on 04/16/21 at 1329  Med List Status: <None>   Medication Order Taking? Sig Documenting Provider Last Dose Status Informant  allopurinol (ZYLOPRIM) 300 MG tablet [id number] Yes Take 300 mg by mouth daily. [provider] Taking Active   aspirin EC 81 MG tablet [id number] Yes Take 1 tablet (81 mg total) by mouth daily. Darreld Mclean, PA-C Taking Active   furosemide (LASIX) 20 MG tablet [id number] Yes Take 1 tablet (20 mg total) by mouth daily. Binnie Rail, MD Taking Active   gabapentin (NEURONTIN) 600 MG tablet [id number] Yes Take 1 tablet (600 mg total) by mouth 2 (two) times daily. Binnie Rail, MD Taking Active   hydrocortisone 2.5 % cream [id number] Yes Apply topically 2 (two) times daily. [provider] Taking Active   losartan (COZAAR) 25 MG tablet [id number] Yes Take 1 tablet (25 mg total) by mouth daily. Binnie Rail, MD Taking Active   metoprolol tartrate (LOPRESSOR) 25 MG tablet [id number] Yes Take 1 tablet (25 mg total) by mouth 2 (two) times daily. Binnie Rail, MD Taking Active   omeprazole (PRILOSEC) 20 MG capsule [id number] Yes TAKE 2 CAPSULES DAILY FOR 1 MONTH, THEN 1 CAPSULE DAILY Burns, Claudina Lick, MD Taking Active   pravastatin (PRAVACHOL) 40 MG tablet [id number] Yes Take 40 mg by mouth daily. [provider] Taking Active   traMADol  (ULTRAM) 50 MG tablet [id number] Yes Take 1 tablet (50 mg total) by mouth every 12 (twelve) hours as needed for moderate pain (for chronic shoulder pain). Binnie Rail, MD Taking Active             Patient Active Problem List   Diagnosis Date Noted   Cat scratch of right lower leg 04/16/2021   Lower extremity ulceration ([place]) 03/08/2021   Weight loss 02/19/2021   Lightheadedness 08/81/1031   Periumbilical pain 59/45/8592   Hand laceration 11/06/2020   Fall 08/21/2020   Head trauma, initial encounter 08/21/2020   Bilateral leg edema 01/26/2020   GERD (gastroesophageal reflux disease) 01/26/2020   Chronic pain of both shoulders 12/10/2018   Easy bruising 03/20/2018   Decreased hearing of right ear 09/04/2017   Rotator cuff tear arthropathy of both shoulders 10/18/2015   Thrombocytopenia ([place]) 09/24/2015   Diverticulosis of colon without hemorrhage 07/02/2014   Hypertrophic cardiomyopathy ([place]) 03/24/2011   OBSTRUCTIVE SLEEP APNEA 11/08/2010   HYPERTENSION, PULMONARY 09/16/2010   HEARING LOSS, BILATERAL 06/18/2009   Coronary atherosclerosis 03/07/2009   Aortic valve disorder, mod AR, mod AS 03/07/2009   CAROTID STENOSIS 03/07/2009   Hyperlipidemia 11/15/2008   Peripheral neuropathy, idiopathic 11/15/2008   Essential hypertension 11/15/2008   ARTHRALGIA 11/15/2008   COLITIS, HX OF 11/15/2008   VENOUS INSUFFICIENCY 04/20/2007   Gout 02/10/2007    Immunization History  Administered Date(s) Administered   Fluad Quad(high Dose 65+) 06/25/2020   Influenza, High Dose Seasonal PF 06/30/2014, 07/04/2017, 06/11/2018, 06/22/2019   Influenza,inj,Quad PF,6+ Mos 07/04/2017   Influenza-Unspecified 07/06/2013, 06/07/2015, 07/08/2016   PFIZER(Purple Top)SARS-COV-2 Vaccination 10/22/2019, 11/11/2019   Pneumococcal Conjugate-13 06/07/2015   Pneumococcal Polysaccharide-23 12/05/2013, 07/08/2016   Td 02/10/2015    Conditions to be addressed/monitored:  Hypertension, Hyperlipidemia,  Coronary Artery Disease, Chronic Kidney Disease, BPH and Gout  There are no care plans that you recently modified to display for this patient.    Medication Assistance: None required.  Patient affirms current coverage meets needs.  Patient's  preferred pharmacy is: [facility] [place], [place] - [address] [place] [address] Phone: [phone] Fax: [phone]  Uses pill box? No - prefers bottles Pt endorses 100% compliance  We discussed: Current pharmacy is preferred with insurance plan and patient is satisfied with pharmacy services Patient decided to: Continue current medication management strategy  Care Plan and Follow Up Patient Decision:  Patient agrees to Care Plan and Follow-up.  Plan: Telephone follow up appointment with care management team member scheduled for:  6 months  Charlene Brooke, PharmD, [place], CPP Clinical Pharmacist [facility] [phone]   Current Barriers:  Unable to independently monitor therapeutic efficacy  Pharmacist Clinical Goal(s):  Patient will achieve adherence to monitoring guidelines and medication adherence to achieve therapeutic efficacy through collaboration with PharmD and provider.   Interventions: 1:1 collaboration with Binnie Rail, MD regarding development and update of comprehensive plan of care as evidenced by provider attestation and co-signature Inter-disciplinary care team collaboration (see longitudinal plan of care) Comprehensive medication review performed; medication list updated in electronic medical record  Hypertension (BP goal <140/90) -Hx Pulmonary HTN, hypertrophic cardiomyopathy -Controlled -Current treatment: Losartan 25 mg daily  Metoprolol tartrate 25 mg BID ([facility]) Furosemide 20 mg PRN -Medications previously tried: n/a -Current home readings: 140/78 -Denies  hypotensive/hypertensive symptoms -Educated on BP goals and benefits of medications for prevention of heart attack, stroke and kidney damage; -Counseled on indication for furosemide and when to use for excess swelling -Recommended to continue current medication  Hyperlipidemia: (LDL goal < 100) -Hx coronary atherosclerosis, carotid stenosis, venous insufficiency -Controlled - LD at goal -Current treatment: Pravastatin 40 mg daily - [facility] Aspirin 81 mg daily -Educated on Cholesterol goals;  Benefits of statin for ASCVD risk reduction; -Recommended to continue current medication  GERD (Goal: manage symptoms) -Controlled - pt is not taking PPI regularly and denies issues with hearburn/reflux -Current treatment  Omeprazole 20 mg daily - not taking -Counseled on using omeprazole as needed for heartburn/reflux symptoms  Pain (Goal: manage symptoms) -osteoarthritis, neuropathy -Controlled - pt is not using tramadol, was not sure what it was for -Current treatment  Gabapentin 600 mg BID - [facility] Tramadol 50 mg q12h PRN - NF -Counseled on indication and use of tramadol for shoulder pain -Recommended to continue current medication  Gout (Goal: maintain uric acid < 6) -Controlled - pt is not taking allopurinol, he has not had gout flare in years and uric acid is below goal -Current treatment  Allopurinol 300 mg daily PRN (taking??) -Recommended not to restart allopurinol  Health Maintenance -Vaccine gaps: covid vaccine, shingrix -Current therapy:  Vitamin B12  -Patient is satisfied with current therapy and denies issues -Recommended to continue current medication  Patient Goals/Self-Care Activities Patient will:  - take medications as prescribed focus on medication adherence by routine -[facility] -Take tramadol as needed for shoulder pain -Use omeprazole as needed for heartburn / reflux -Use furosemide as needed for increased swelling in  feet/ankles

## 2021-05-15 ENCOUNTER — Other Ambulatory Visit: Payer: Self-pay | Admitting: Internal Medicine

## 2021-05-16 ENCOUNTER — Telehealth: Payer: Self-pay | Admitting: Internal Medicine

## 2021-05-16 MED ORDER — GABAPENTIN 600 MG PO TABS: 600.0000 mg | ORAL_TABLET | Freq: Two times a day (BID) | ORAL | 1 refills | Status: DC

## 2021-05-16 NOTE — Telephone Encounter (Signed)
Gabapentin

## 2021-05-16 NOTE — Addendum Note (Signed)
Addended by: Binnie Rail on: 05/16/2021 02:43 PM   Modules accepted: Orders

## 2021-05-22 NOTE — Chronic Care Management (AMB) (Signed)
  Chronic Care Management   Note  05/22/2021 Name: [name] MRN: [id number] DOB: Oct 05, 1928  Paul Mays is a 87 y.o. year old male who is a primary care patient of Burns, Claudina Lick, MD. I reached out to [name] by phone today in response to a referral sent by Paul Mays's PCP, Dr. Quay Burow.      Paul Mays was given information about Chronic Care Management services today including:  CCM service includes personalized support from designated clinical staff supervised by his physician, including individualized plan of care and coordination with other care providers 24/7 contact phone numbers for assistance for urgent and routine care needs. Service will only be billed when office clinical staff spend 20 minutes or more in a month to coordinate care. Only one practitioner may furnish and bill the service in a calendar month. The patient may stop CCM services at any time (effective at the end of the month) by phone call to the office staff. The patient will be responsible for cost sharing (co-pay) of up to 20% of the service fee (after annual deductible is met).  Patient agreed to services and verbal consent obtained.   Follow up plan: Telephone appointment with care management team member scheduled for:06/07/21  [place] [facility]  Direct Dial: [phone]

## 2021-05-23 ENCOUNTER — Telehealth: Payer: Self-pay | Admitting: Pharmacist

## 2021-05-23 NOTE — Progress Notes (Signed)
Chronic Care Management Pharmacy Assistant   Name: Paul Mays  MRN: [id number] DOB: 29-Mar-1928  Reason for Encounter: Disease State   Conditions to be addressed/monitored: HTN  Recent office visits:  None listed  Recent consult visits:  None listed  Hospital visits:  None in previous 6 months  Medications: Outpatient Encounter Medications as of 05/23/2021  Medication Sig   allopurinol (ZYLOPRIM) 300 MG tablet TAKE 1 TABLET(300 MG) BY MOUTH DAILY AS NEEDED FOR GOUT FLARE   aspirin EC 81 MG tablet Take 1 tablet (81 mg total) by mouth daily.   furosemide (LASIX) 20 MG tablet Take 1 tablet (20 mg total) by mouth daily.   gabapentin (NEURONTIN) 600 MG tablet Take 1 tablet (600 mg total) by mouth 2 (two) times daily.   hydrocortisone 2.5 % cream Apply topically 2 (two) times daily.   losartan (COZAAR) 25 MG tablet TAKE 1 TABLET(25 MG) BY MOUTH DAILY   metoprolol tartrate (LOPRESSOR) 25 MG tablet Take 1 tablet (25 mg total) by mouth 2 (two) times daily.   omeprazole (PRILOSEC) 20 MG capsule TAKE 2 CAPSULES DAILY FOR 1 MONTH, THEN 1 CAPSULE DAILY   pravastatin (PRAVACHOL) 40 MG tablet Take 40 mg by mouth daily.   traMADol (ULTRAM) 50 MG tablet Take 1 tablet (50 mg total) by mouth every 12 (twelve) hours as needed for moderate pain (for chronic shoulder pain).   No facility-administered encounter medications on file as of 05/23/2021.   Reviewed chart prior to disease state call. Spoke with patient regarding BP  Recent Office Vitals: BP Readings from Last 3 Encounters:  04/16/21 134/80  03/08/21 130/80  03/06/21 130/78   Pulse Readings from Last 3 Encounters:  04/16/21 60  03/08/21 60  03/06/21 (!) 55    Wt Readings from Last 3 Encounters:  04/16/21 154 lb (69.9 kg)  03/08/21 152 lb 12.8 oz (69.3 kg)  03/06/21 152 lb (68.9 kg)     Kidney Function Lab Results  Component Value Date/Time   CREATININE 1.03 02/19/2021 02:34 PM   CREATININE 1.25 (H) 06/25/2020 10:55 AM    CREATININE 1.42 02/28/2020 03:06 PM   GFR 63.61 02/19/2021 02:34 PM   GFRNONAA 50 (L) 06/25/2020 10:55 AM   GFRAA 58 (L) 06/25/2020 10:55 AM    BMP Latest Ref Rng & Units 02/19/2021 06/25/2020 02/28/2020  Glucose 70 - 99 mg/dL 88 104(H) 94  BUN 6 - 23 mg/dL 25(H) 21 33(H)  Creatinine 0.40 - 1.50 mg/dL 1.03 1.25(H) 1.42  BUN/Creat Ratio 6 - 22 (calc) - 17 -  Sodium 135 - 145 mEq/L 143 142 141  Potassium 3.5 - 5.1 mEq/L 5.0 4.9 4.6  Chloride 96 - 112 mEq/L 106 107 107  CO2 19 - 32 mEq/L '31 26 28  '$ Calcium 8.4 - 10.5 mg/dL 9.7 8.8 8.8    Current antihypertensive regimen:  Losartan 25 mg daily  Metoprolol tartrate 50 mg - 1/2 tab BID - [facility] Terazosin 2 mg HS - [facility] Furosemide 20 mg PRN  How often are you checking your Blood Pressure?  None  Current home BP readings:  Patient states he has a BP machine but is unable to use it by his self.  What recent interventions/DTPs have been made by any provider to improve Blood Pressure control since last CPP Visit:   None noted  Any recent hospitalizations or ED visits since last visit with CPP? No  What diet changes have been made to improve Blood Pressure Control?  Patient states his  diet is good, he recently experienced weight lost of 180 lbs down to 156 lbs.  What exercise is being done to improve your Blood Pressure Control?  Patient states he still pretty active and runs all his errands.  Patient states he still gets light headed sometimes but not as often as we was.  Adherence Review: Is the patient currently on ACE/ARB medication? Yes Does the patient have >5 day gap between last estimated fill dates? No  Star Rating Drugs: Losartan - last fill 05/15/21 90D Pravastatin - last fill - no longer taking  Orinda Kenner, RMA Clinical Pharmacists Assistant [phone]  Time Spent: 45

## 2021-06-05 ENCOUNTER — Telehealth: Payer: Self-pay

## 2021-06-05 NOTE — Telephone Encounter (Signed)
Patient requesting a note for social security stating that he is capable of handling his own money.

## 2021-06-06 ENCOUNTER — Ambulatory Visit: Payer: Medicare Other | Admitting: Family Medicine

## 2021-06-06 NOTE — Telephone Encounter (Signed)
Letter written

## 2021-06-06 NOTE — Telephone Encounter (Signed)
Left message for patient.  Letter left up front for pick up.

## 2021-06-07 ENCOUNTER — Telehealth: Payer: Medicare Other

## 2021-06-07 ENCOUNTER — Telehealth: Payer: Self-pay | Admitting: *Deleted

## 2021-06-07 ENCOUNTER — Encounter: Payer: Self-pay | Admitting: *Deleted

## 2021-06-07 NOTE — Telephone Encounter (Signed)
  Chronic Care Management   Follow Up Note   06/07/2021 Name: Paul Mays MRN: [id number] DOB: 1927/11/26  Referred by: Binnie Rail, MD Reason for referral : Chronic Care Management (CCM RN CM Initial Outreach- Unsuccessful Attempt)  An unsuccessful telephone outreach was attempted today. The patient was referred to the case management team for assistance with care management and care coordination.   Follow Up Plan:  A HIPPA compliant phone message was left for the patient providing contact information and requesting a return call Will place request with scheduling care guide to contact patient to re-schedule today's missed CCM RN initial telephone appointment  Oneta Rack, RN, BSN, [place] [phone]: direct office [phone]: mobile

## 2021-06-27 ENCOUNTER — Ambulatory Visit (INDEPENDENT_AMBULATORY_CARE_PROVIDER_SITE_OTHER): Payer: Medicare Other | Admitting: Pharmacist

## 2021-06-27 ENCOUNTER — Other Ambulatory Visit: Payer: Self-pay

## 2021-06-27 DIAGNOSIS — M25512 Pain in left shoulder: Secondary | ICD-10-CM

## 2021-06-27 DIAGNOSIS — I2510 Atherosclerotic heart disease of native coronary artery without angina pectoris: Secondary | ICD-10-CM

## 2021-06-27 DIAGNOSIS — N1831 Chronic kidney disease, stage 3a: Secondary | ICD-10-CM

## 2021-06-27 DIAGNOSIS — M109 Gout, unspecified: Secondary | ICD-10-CM

## 2021-06-27 DIAGNOSIS — I10 Essential (primary) hypertension: Secondary | ICD-10-CM

## 2021-06-27 DIAGNOSIS — G8929 Other chronic pain: Secondary | ICD-10-CM

## 2021-06-27 DIAGNOSIS — E7849 Other hyperlipidemia: Secondary | ICD-10-CM

## 2021-06-27 NOTE — Progress Notes (Signed)
Chronic Care Management Pharmacy Note  06/27/2021 Name:  Paul Mays MRN:  [id number] DOB:  06-18-1928  Summary: -Pt reports occasional lightheadedness still, he denies losing balance or falls. He cannot check his BP at home due to shoulder issues. -Pt is using Aleve for shoulder arthritis - reports it is the only thing that helps (steroid injection did not help)  Recommendations/Changes made from today's visit: -Counseled to limit Aleve use as much as possible -Advised to get flu shot at pharmacy   Subjective: Paul Mays is an 87 y.o. year old male who is a primary patient of Burns, Claudina Lick, MD.  The CCM team was consulted for assistance with disease management and care coordination needs.    Engaged with patient by telephone for follow up visit in response to provider referral for pharmacy case management and/or care coordination services.   Consent to Services:  The patient was given information about Chronic Care Management services, agreed to services, and gave verbal consent prior to initiation of services.  Please see initial visit note for detailed documentation.   Patient Care Team: Binnie Rail, MD as PCP - General (Internal Medicine) Sherren Mocha, MD as PCP - Cardiology (Cardiology) Charlton Haws, [facility] as Pharmacist (Pharmacist) Knox Royalty, RN as [place] Management   Patient lives at home with wife who has dementia. He takes care of his medications and hers.  Recent office visits: 04/16/21 Dr Quay Burow OV: cat bite 7/5, 4 stitches. Removed stitches.  03/08/21 Dr Quay Burow OV: c/o wt loss. Monitor wound on leg.  02/25/21 Dr Quay Burow OV: shoulder pain; referred to sports med  02/19/21 Dr Quay Burow OV: c/o lightheadedness. Off terazosin per pharmD. Reduced losartan to 25 mg.  11/06/20 Dr Quay Burow OV: acute visit for hand pain after fall. Dermabond applied.   Recent consult visits: 03/06/21 Dr Georgina Snell (sports med): shoulder pain. Xrays show severe  arthritis. Given steroid injection. RTC 3 months.  10/02/20 Dr Ronnald Ramp (dermatology): f/u seborrheic dermatitis  [facility]  [facility] visits: None in previous 6 months  Objective:  Lab Results  Component Value Date   CREATININE 1.03 02/19/2021   BUN 25 (H) 02/19/2021   GFR 63.61 02/19/2021   GFRNONAA 50 (L) 06/25/2020   GFRAA 58 (L) 06/25/2020   NA 143 02/19/2021   K 5.0 02/19/2021   CALCIUM 9.7 02/19/2021   CO2 31 02/19/2021   GLUCOSE 88 02/19/2021    Lab Results  Component Value Date/Time   HGBA1C 5.1 09/24/2015 11:08 AM   HGBA1C 5.3 01/27/2011 04:55 PM   GFR 63.61 02/19/2021 02:34 PM   GFR 46.81 (L) 02/28/2020 03:06 PM    Last diabetic Eye exam: No results found for: HMDIABEYEEXA  Last diabetic Foot exam: No results found for: HMDIABFOOTEX   Lab Results  Component Value Date   CHOL 149 12/09/2017   HDL 72.30 12/09/2017   LDLCALC 65 12/09/2017   TRIG 57.0 12/09/2017   CHOLHDL 2 12/09/2017    Hepatic Function Latest Ref Rng & Units 02/19/2021 06/25/2020 03/19/2018  Total Protein 6.0 - 8.3 g/dL 6.0 5.4(L) 6.1  Albumin 3.5 - 5.2 g/dL 4.2 - 4.0  AST 0 - 37 U/L _0 ALT 0 - 53 U/L _1 Alk Phosphatase 39 - 117 U/L 31(L) - 27(L)  Total Bilirubin 0.2 - 1.2 mg/dL 2.3(H) 1.5(H) 1.8(H)  Bilirubin, Direct 0.0 - 0.3 mg/dL - - -    Lab Results  Component Value Date/Time  TSH 2.34 02/19/2021 02:34 PM   TSH 1.97 09/24/2015 11:08 AM    CBC Latest Ref Rng & Units 02/19/2021 06/25/2020 03/19/2018  WBC 4.0 - 10.5 K/uL 4.8 5.1 5.1  Hemoglobin 13.0 - 17.0 g/dL 12.9(L) 12.1(L) 13.6  Hematocrit 39.0 - 52.0 % 38.5(L) 36.7(L) 39.8  Platelets 150.0 - 400.0 K/uL 115.0(L) 121(L) 101.0(L)    No results found for: VD25OH  Clinical ASCVD: Yes  The ASCVD Risk score (Arnett DK, et al., 2019) failed to calculate for the following reasons:   The 2019 ASCVD risk score is only valid for ages 85 to 80    Depression screen PHQ 2/9 01/16/2021 08/21/2020 08/17/2019   Decreased Interest 0 0 0  Down, Depressed, Hopeless 0 0 0  PHQ - 2 Score 0 0 0  Altered sleeping - - -  Tired, decreased energy - - -  Change in appetite - - -  Feeling bad or failure about yourself  - - -  Trouble concentrating - - -  Moving slowly or fidgety/restless - - -  Suicidal thoughts - - -  PHQ-9 Score - - -  Difficult doing work/chores - - -  Some recent data might be hidden     Social History   Tobacco Use  Smoking Status Never  Smokeless Tobacco Never   BP Readings from Last 3 Encounters:  04/16/21 134/80  03/08/21 130/80  03/06/21 130/78   Pulse Readings from Last 3 Encounters:  04/16/21 60  03/08/21 60  03/06/21 (!) 55   Wt Readings from Last 3 Encounters:  04/16/21 154 lb (69.9 kg)  03/08/21 152 lb 12.8 oz (69.3 kg)  03/06/21 152 lb (68.9 kg)   BMI Readings from Last 3 Encounters:  04/16/21 24.86 kg/m  03/08/21 24.66 kg/m  03/06/21 24.53 kg/m    Assessment/Interventions: Review of patient past medical history, allergies, medications, health status, including review of consultants reports, laboratory and other test data, was performed as part of comprehensive evaluation and provision of chronic care management services.   SDOH:  (Social Determinants of Health) assessments and interventions performed: Yes   SDOH Screenings   Alcohol Screen: Low Risk    Last Alcohol Screening Score (AUDIT): 4  Depression (PHQ2-9): Low Risk    PHQ-2 Score: 0  Financial Resource Strain: Low Risk    Difficulty of Paying Living Expenses: Not hard at all  Food Insecurity: No Food Insecurity   Worried About Charity fundraiser in the Last Year: Never true   Ran Out of Food in the Last Year: Never true  Housing: Low Risk    Last Housing Risk Score: 0  Physical Activity: Sufficiently Active   Days of Exercise per Week: 5 days   Minutes of Exercise per Session: 30 min  Social Connections: Engineer, building services of Communication with Friends and Family:  More than three times a week   Frequency of Social Gatherings with Friends and Family: More than three times a week   Attends Religious Services: More than 4 times per year   Active Member of [employer] or Organizations: Yes   Attends Music therapist: More than 4 times per year   Marital Status: Married  Stress: No Stress Concern Present   Feeling of Stress : Not at all  Tobacco Use: Low Risk    Smoking Tobacco Use: Never   Smokeless Tobacco Use: Never  Transportation Needs: No Transportation Needs   Lack of Transportation (Medical): No   Lack of Transportation (  Non-Medical): No    CCM Care Plan  Allergies  Allergen Reactions   Lisinopril Other (See Comments)    Hyperkalemia in the context of combined spironolactone and lisinopril therapy   Spironolactone Other (See Comments)    Hyperkalemia in context of spironolactone and lisinopril therapy    Medications Reviewed Today     Reviewed by Charlton Haws, [facility] (Pharmacist) on 06/27/21 at 1137  Med List Status: <None>   Medication Order Taking? Sig Documenting Provider Last Dose Status Informant  allopurinol (ZYLOPRIM) 300 MG tablet [id number] Yes TAKE 1 TABLET(300 MG) BY MOUTH DAILY AS NEEDED FOR GOUT FLARE  Patient taking differently: Take 300 mg by mouth every other day.   Binnie Rail, MD Taking Active   aspirin EC 81 MG tablet [id number] Yes Take 1 tablet (81 mg total) by mouth daily. Darreld Mclean, PA-C Taking Active   Cholecalciferol (VITAMIN D3) 50 MCG (2000 UT) capsule [id number] Yes Take 2,000 Units by mouth daily. [provider] Taking Active   furosemide (LASIX) 20 MG tablet [id number] Yes Take 1 tablet (20 mg total) by mouth daily. Binnie Rail, MD Taking Active   gabapentin (NEURONTIN) 600 MG tablet [id number] Yes Take 1 tablet (600 mg total) by mouth 2 (two) times daily. Binnie Rail, MD Taking Active   hydrocortisone 2.5 % cream [id number] Yes Apply topically 2 (two) times daily.  [provider] Taking Active   losartan (COZAAR) 25 MG tablet [id number] Yes TAKE 1 TABLET(25 MG) BY MOUTH DAILY Burns, Claudina Lick, MD Taking Active   metoprolol tartrate (LOPRESSOR) 25 MG tablet [id number] Yes Take 1 tablet (25 mg total) by mouth 2 (two) times daily. Binnie Rail, MD Taking Active   pravastatin (PRAVACHOL) 40 MG tablet [id number] Yes Take 40 mg by mouth daily. [provider] Taking Active             Patient Active Problem List   Diagnosis Date Noted   Cat scratch of right lower leg 04/16/2021   Lower extremity ulceration ([place]) 03/08/2021   Weight loss 02/19/2021   Lightheadedness 62/12/5595   Periumbilical pain 41/63/8453   Hand laceration 11/06/2020   Fall 08/21/2020   Head trauma, initial encounter 08/21/2020   Bilateral leg edema 01/26/2020   GERD (gastroesophageal reflux disease) 01/26/2020   Chronic pain of both shoulders 12/10/2018   Easy bruising 03/20/2018   Decreased hearing of right ear 09/04/2017   Rotator cuff tear arthropathy of both shoulders 10/18/2015   Thrombocytopenia ([place]) 09/24/2015   Diverticulosis of colon without hemorrhage 07/02/2014   Hypertrophic cardiomyopathy ([place]) 03/24/2011   OBSTRUCTIVE SLEEP APNEA 11/08/2010   HYPERTENSION, PULMONARY 09/16/2010   HEARING LOSS, BILATERAL 06/18/2009   Coronary atherosclerosis 03/07/2009   Aortic valve disorder, mod AR, mod AS 03/07/2009   CAROTID STENOSIS 03/07/2009   Hyperlipidemia 11/15/2008   Peripheral neuropathy, idiopathic 11/15/2008   Essential hypertension 11/15/2008   ARTHRALGIA 11/15/2008   COLITIS, HX OF 11/15/2008   VENOUS INSUFFICIENCY 04/20/2007   Gout 02/10/2007    Immunization History  Administered Date(s) Administered   Fluad Quad(high Dose 65+) 06/25/2020   Influenza, High Dose Seasonal PF 06/30/2014, 07/04/2017, 06/11/2018, 06/22/2019   Influenza,inj,Quad PF,6+ Mos 07/04/2017   Influenza-Unspecified 07/06/2013, 06/07/2015, 07/08/2016   PFIZER(Purple  Top)SARS-COV-2 Vaccination 10/22/2019, 11/11/2019   Pneumococcal Conjugate-13 06/07/2015   Pneumococcal Polysaccharide-23 12/05/2013, 07/08/2016   Td 02/10/2015    Conditions to be addressed/monitored:  Hypertension, Hyperlipidemia, Coronary Artery Disease, Chronic Kidney Disease, Osteoarthritis, and Gout  Care Plan :  CCM Pharmacy Care Plan  Updates made by Charlton Haws, RPH since 06/27/2021 12:00 AM     Problem: Hypertension, Hyperlipidemia, Coronary Artery Disease, Chronic Kidney Disease, Osteoarthritis and Gout   Priority: High     Long-Range Goal: Disease management   Start Date: 01/10/2021  Expected End Date: 01/10/2022  This Visit's Progress: On track  Recent Progress: On track  Priority: High  Note:   Current Barriers:  Unable to independently monitor therapeutic efficacy  Pharmacist Clinical Goal(s):  Patient will achieve adherence to monitoring guidelines and medication adherence to achieve therapeutic efficacy through collaboration with PharmD and provider.   Interventions: 1:1 collaboration with Binnie Rail, MD regarding development and update of comprehensive plan of care as evidenced by provider attestation and co-signature Inter-disciplinary care team collaboration (see longitudinal plan of care) Comprehensive medication review performed; medication list updated in electronic medical record  Hypertension / CKD stage 3a (BP goal <140/90) -Controlled - BP was at goal in recent office visits; he is unable to check BP at home due to shoulder arthritis -Hx Pulmonary HTN, hypertrophic cardiomyopathy -Current treatment: Losartan 25 mg daily  Metoprolol tartrate 50 mg - 1/2 tab BID - [facility] Furosemide 20 mg PRN -Medications previously tried: terazosin -Educated on BP goals and benefits of medications for prevention of heart attack, stroke and kidney damage; -Recommended to continue current medication  Hyperlipidemia: (LDL goal < 100) -Controlled - LD at goal; pt  endorses compliance with statin and denies issues -Hx coronary atherosclerosis, carotid stenosis, venous insufficiency -Current treatment: Pravastatin 40 mg daily - [facility] Aspirin 81 mg daily -Educated on Cholesterol goals; Benefits of statin for ASCVD risk reduction; -Recommended to continue current medication  GERD (Goal: manage symptoms) -Controlled - pt is not taking PPI regularly and denies issues with hearburn/reflux -Current treatment  Omeprazole 20 mg daily - not taking -Removed omeprazole from med list  Pain (Goal: manage symptoms) -Controlled - pt reports he take gabapentin "religiously" and uses Aleve as needed for shoulder pain; he reports Aleve is the only thing that helps; he had steroid injection in June and reports it did not help -osteoarthritis, neuropathy -Current treatment  Gabapentin 600 mg BID - [facility] Aleve PRN -Counseled on NSAID risks; advised to use lowest dose possible only as needed -Recommended to continue current medication  Gout (Goal: maintain uric acid < 6) -Controlled - pt reports he is taking allopurinol every other day; he has not had a gout flare in years and most recent uric acid was below goal -Current treatment  Allopurinol 300 mg daily - taking QOD -Counseled on benefits of allopurinol for gout prevention; advised abruptly stopping allopurinol can increase risk for a flare -Recommend to continue current medication  Health Maintenance -Vaccine gaps: Shingrix, Flu -Advised pt to get flu shot at local pharmacy -Current therapy:  Vitamin B12  Vitamin D3 2000 IU -Patient is satisfied with current therapy and denies issues -Recommended to continue current medication  Patient Goals/Self-Care Activities Patient will:  - take medications as prescribed -focus on medication adherence by routine -[facility] -Limit Aleve use as much as possible -Get flu shot at local pharmacy       Medication Assistance:  None required.  Patient affirms current coverage meets needs.  Patient's preferred pharmacy is: [facility] [phone] - [place], [place] - [address] [place] [address] Phone: [phone] Fax: [phone]  Uses pill  box? No - prefers bottles Pt endorses 100% compliance  We discussed: Current pharmacy is preferred with insurance plan and patient is satisfied with pharmacy services Patient decided to: Continue current medication management strategy  Care Plan and Follow Up Patient Decision:  Patient agrees to Care Plan and Follow-up.  Plan: Telephone follow up appointment with care management team member scheduled for:  6 months  Charlene Brooke, PharmD, [place], CPP Clinical Pharmacist [facility] at [facility] [phone]

## 2021-06-27 NOTE — Patient Instructions (Addendum)
Visit Information  Phone number for Pharmacist: [phone]   Goals Addressed             This Visit's Progress    Manage My Medicine       Timeframe:  Long-Range Goal Priority:  Medium Start Date:      01/10/21                       Expected End Date:     01/10/22                  Follow Up Date March 2023   - call for medicine refill 2 or 3 days before it runs out - call if I am sick and can't take my medicine - keep a list of all the medicines I take; vitamins and herbals too  -[facility] -Limit Aleve use as much as possible -Get flu shot at local pharmacy    Why is this important?   These steps will help you keep on track with your medicines.   Notes:         Care Plan : CCM Pharmacy Care Plan  Updates made by Charlton Haws, RPH since 06/27/2021 12:00 AM     Problem: Hypertension, Hyperlipidemia, Coronary Artery Disease, Chronic Kidney Disease, Osteoarthritis and Gout   Priority: High     Long-Range Goal: Disease management   Start Date: 01/10/2021  Expected End Date: 01/10/2022  This Visit's Progress: On track  Recent Progress: On track  Priority: High  Note:   Current Barriers:  Unable to independently monitor therapeutic efficacy  Pharmacist Clinical Goal(s):  Patient will achieve adherence to monitoring guidelines and medication adherence to achieve therapeutic efficacy through collaboration with PharmD and provider.   Interventions: 1:1 collaboration with Binnie Rail, MD regarding development and update of comprehensive plan of care as evidenced by provider attestation and co-signature Inter-disciplinary care team collaboration (see longitudinal plan of care) Comprehensive medication review performed; medication list updated in electronic medical record  Hypertension / CKD stage 3a (BP goal <140/90) -Controlled - BP was at goal in recent office visits; he is unable to check BP at home due to shoulder  arthritis -Hx Pulmonary HTN, hypertrophic cardiomyopathy -Current treatment: Losartan 25 mg daily  Metoprolol tartrate 50 mg - 1/2 tab BID - [facility] Furosemide 20 mg PRN -Medications previously tried: terazosin -Educated on BP goals and benefits of medications for prevention of heart attack, stroke and kidney damage; -Recommended to continue current medication  Hyperlipidemia: (LDL goal < 100) -Controlled - LD at goal; pt endorses compliance with statin and denies issues -Hx coronary atherosclerosis, carotid stenosis, venous insufficiency -Current treatment: Pravastatin 40 mg daily - [facility] Aspirin 81 mg daily -Educated on Cholesterol goals; Benefits of statin for ASCVD risk reduction; -Recommended to continue current medication  GERD (Goal: manage symptoms) -Controlled - pt is not taking PPI regularly and denies issues with hearburn/reflux -Current treatment  Omeprazole 20 mg daily - not taking -Removed omeprazole from med list  Pain (Goal: manage symptoms) -Controlled - pt reports he take gabapentin "religiously" and uses Aleve as needed for shoulder pain; he reports Aleve is the only thing that helps; he had steroid injection in June and reports it did not help -osteoarthritis, neuropathy -Current treatment  Gabapentin 600 mg BID - [facility] Aleve PRN -Counseled on NSAID risks; advised to use lowest dose possible only as needed -Recommended to continue current medication  Gout (  Goal: maintain uric acid < 6) -Controlled - pt reports he is taking allopurinol every other day; he has not had a gout flare in years and most recent uric acid was below goal -Current treatment  Allopurinol 300 mg daily - taking QOD -Counseled on benefits of allopurinol for gout prevention; advised abruptly stopping allopurinol can increase risk for a flare -Recommend to continue current medication  Health Maintenance -Vaccine gaps: Shingrix, Flu -Advised pt to get flu shot at local pharmacy -Current therapy:   Vitamin B12  Vitamin D3 2000 IU -Patient is satisfied with current therapy and denies issues -Recommended to continue current medication  Patient Goals/Self-Care Activities Patient will:  - take medications as prescribed -focus on medication adherence by routine -[facility] -Limit Aleve use as much as possible -Get flu shot at local pharmacy      The patient verbalized understanding of instructions, educational materials, and care plan provided today and declined offer to receive copy of patient instructions, educational materials, and care plan.  Telephone follow up appointment with pharmacy team member scheduled for: 6 months  Charlene Brooke, PharmD, [place], CPP Clinical Pharmacist [facility] at [facility] [phone]

## 2021-07-03 ENCOUNTER — Telehealth: Payer: Self-pay | Admitting: *Deleted

## 2021-07-03 NOTE — Chronic Care Management (AMB) (Signed)
  Care Management   Note  07/03/2021 Name: Paul Mays MRN: [id number] DOB: 08-20-1928  Paul Mays is a 87 y.o. year old male who is a primary care patient of Quay Burow, Claudina Lick, MD and is actively engaged with the care management team. I reached out to [name] by phone today to assist with re-scheduling an initial visit with the RN Case Manager  Follow up plan: Unsuccessful telephone outreach attempt made. The care management team will reach out to the patient again over the next 7 days. If patient returns call to provider office, please advise to call [place] at [phone].  [place] Management  Direct Dial: [phone]

## 2021-07-05 DIAGNOSIS — I2510 Atherosclerotic heart disease of native coronary artery without angina pectoris: Secondary | ICD-10-CM | POA: Diagnosis not present

## 2021-07-05 DIAGNOSIS — N1831 Chronic kidney disease, stage 3a: Secondary | ICD-10-CM

## 2021-07-05 DIAGNOSIS — E7849 Other hyperlipidemia: Secondary | ICD-10-CM | POA: Diagnosis not present

## 2021-07-05 DIAGNOSIS — I10 Essential (primary) hypertension: Secondary | ICD-10-CM

## 2021-07-08 ENCOUNTER — Telehealth: Payer: Medicare Other

## 2021-07-11 NOTE — Chronic Care Management (AMB) (Signed)
  Care Management   Note  07/11/2021 Name: [name] MRN: [id number] DOB: 08/08/1928  Paul Mays is a 87 y.o. year old male who is a primary care patient of Quay Burow, Claudina Lick, MD and is actively engaged with the care management team. I reached out to [name] by phone today to assist with re-scheduling an initial visit with the RN Case Manager  Follow up plan: Unsuccessful telephone outreach attempt made. The care management team will reach out to the patient again over the next 7 days. If patient returns call to provider office, please advise to call [place] at [phone].  [place] Management  Direct Dial: [phone]

## 2021-07-18 NOTE — Chronic Care Management (AMB) (Signed)
  Care Management   Note  07/18/2021 Name: Paul Mays MRN: [id number] DOB: [birth year]  Paul Mays is a 87 y.o. year old male who is a primary care patient of Quay Burow, Claudina Lick, MD and is actively engaged with the care management team. I reached out to [name] by phone today to assist with re-scheduling an initial visit with the RN Case Manager  Follow up plan: A third unsuccessful telephone outreach attempt made. A HIPAA compliant phone message was left for the patient providing contact information and requesting a return call. We have been unable to make contact with the patient for follow up. The care management team is available to follow up with the patient after provider conversation with the patient regarding recommendation for care management engagement and subsequent re-referral to the care management team. If patient returns call to provider office, please advise to call [place] at [phone].  [place] Management  Direct Dial: [phone]

## 2021-07-22 DIAGNOSIS — Z23 Encounter for immunization: Secondary | ICD-10-CM | POA: Diagnosis not present

## 2021-07-23 DIAGNOSIS — Z23 Encounter for immunization: Secondary | ICD-10-CM | POA: Diagnosis not present

## 2021-08-04 NOTE — Progress Notes (Signed)
Subjective:    Patient ID: Paul Mays, male    DOB: 12-11-1927, 86 y.o.   MRN: [id number]  This visit occurred during the SARS-CoV-2 public health emergency.  Safety protocols were in place, including screening questions prior to the visit, additional usage of staff PPE, and extensive cleaning of exam room while observing appropriate contact time as indicated for disinfecting solutions.    HPI The patient is here for an acute visit.   Gets really SOB, fatigued when walking for 10-20 feet. He has noticed over the past few months but is it progressively worsened   He feels an occ flutter - 2-3 times a week.  Some b/l leg edema, which is new.   Last Echo - 09/2019 ;   mild MR, severe TR, moderately elevated pulm art systolic pressure, mod AR, Grade 2 DD, HOCM     Medications and allergies reviewed with patient and updated if appropriate.  Patient Active Problem List   Diagnosis Date Noted   Cat scratch of right lower leg 04/16/2021   Lower extremity ulceration ([place]) 03/08/2021   Weight loss 02/19/2021   Lightheadedness 39/12/90   Periumbilical pain 33/00/7622   Hand laceration 11/06/2020   Fall 08/21/2020   Head trauma, initial encounter 08/21/2020   Bilateral leg edema 01/26/2020   GERD (gastroesophageal reflux disease) 01/26/2020   Chronic pain of both shoulders 12/10/2018   Easy bruising 03/20/2018   Decreased hearing of right ear 09/04/2017   Rotator cuff tear arthropathy of both shoulders 10/18/2015   Thrombocytopenia ([place]) 09/24/2015   Diverticulosis of colon without hemorrhage 07/02/2014   Hypertrophic cardiomyopathy ([place]) 03/24/2011   OBSTRUCTIVE SLEEP APNEA 11/08/2010   HYPERTENSION, PULMONARY 09/16/2010   HEARING LOSS, BILATERAL 06/18/2009   Coronary atherosclerosis 03/07/2009   Aortic valve disorder, mod AR, mod AS 03/07/2009   CAROTID STENOSIS 03/07/2009   Hyperlipidemia 11/15/2008   Peripheral neuropathy, idiopathic 11/15/2008   Essential  hypertension 11/15/2008   ARTHRALGIA 11/15/2008   COLITIS, HX OF 11/15/2008   VENOUS INSUFFICIENCY 04/20/2007   Gout 02/10/2007    Current Outpatient Medications on File Prior to Visit  Medication Sig Dispense Refill   allopurinol (ZYLOPRIM) 300 MG tablet TAKE 1 TABLET(300 MG) BY MOUTH DAILY AS NEEDED FOR GOUT FLARE (Patient taking differently: Take 300 mg by mouth every other day.) 90 tablet 0   aspirin EC 81 MG tablet Take 1 tablet (81 mg total) by mouth daily.     Cholecalciferol (VITAMIN D3) 50 MCG (2000 UT) capsule Take 2,000 Units by mouth daily.     furosemide (LASIX) 20 MG tablet Take 1 tablet (20 mg total) by mouth daily. 90 tablet 1   gabapentin (NEURONTIN) 600 MG tablet Take 1 tablet (600 mg total) by mouth 2 (two) times daily. 180 tablet 1   hydrocortisone 2.5 % cream Apply topically 2 (two) times daily.     losartan (COZAAR) 25 MG tablet TAKE 1 TABLET(25 MG) BY MOUTH DAILY 90 tablet 1   metoprolol tartrate (LOPRESSOR) 25 MG tablet Take 1 tablet (25 mg total) by mouth 2 (two) times daily. 180 tablet 3   pravastatin (PRAVACHOL) 40 MG tablet Take 40 mg by mouth daily.     No current facility-administered medications on file prior to visit.    Past Medical History:  Diagnosis Date   Anxiety    Aortic stenosis    Arthralgia    Arthritis    CAD (coronary artery disease)    Carotid stenosis    Cervicalgia  CLUSTER HEADACHE 02/10/2007   Qualifier: Diagnosis of  By: Linna Darner MD, Gwyndolyn Saxon     Colitis    Edema    Gout    Hearing loss    Herpes zoster without mention of complication    Hyperlipidemia    Hypertension    Hyperuricemia    Impacted cerumen    Migraine    Migraine    Murmur    Olecranon bursitis    OSA (obstructive sleep apnea)    Other and unspecified hyperlipidemia    Other malaise and fatigue    Peripheral neuropathy    Polyneuropathy    Pulmonary hypertension (HCC)    Unspecified hereditary and idiopathic peripheral neuropathy    Venous insufficiency      Past Surgical History:  Procedure Laterality Date   ANGIOPLASTY     w/ 2 stents in [place] History   Socioeconomic History   Marital status: Married    Spouse name: [name]   Number of children: 2   Years of education: college   Highest education level: Not on file  Occupational History   Occupation: retired    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 Patient finished  two years of college.    Pt has children.   Caffeine -two cups daily.   Left handed.   Retired Licensed conveyancer                    Social Determinants of Radio broadcast assistant Strain: Low Risk    Difficulty of Paying Living Expenses: Not hard at all  Food Insecurity: No Food Insecurity   Worried About Charity fundraiser in the Last Year: Never true   Arboriculturist in the Last Year: Never true  Transportation Needs: No Transportation Needs   Lack of Transportation (Medical): No   Lack of Transportation (Non-Medical): No  Physical Activity: Sufficiently Active   Days of Exercise per Week: 5 days   Minutes of Exercise per Session: 30 min  Stress: No Stress Concern Present   Feeling of Stress : Not at all  Social Connections: Socially Integrated   Frequency of Communication with Friends and Family: More than three times a week   Frequency of Social Gatherings with Friends and Family: More than three times a week   Attends Religious Services: More than 4 times per year   Active Member of [employer] or  Organizations: Yes   Attends Music therapist: More than 4 times per year   Marital Status: Married    Family History  Problem Relation Age of Onset   Stroke Father        Died, 83   High blood pressure Father    Diabetes Father    Other Mother        Died, 88   Diabetes  Brother        Died, 31   Diabetes Sister        Living, 3   Healthy Daughter    Healthy Son     Review of Systems  Constitutional:  Positive for fatigue. Negative for chills and fever.  Respiratory:  Positive for shortness of breath. Negative for cough and wheezing.   Cardiovascular:  Positive for palpitations and leg swelling. Negative for chest pain.  Neurological:  Positive for light-headedness (all the time). Negative for headaches.      Objective:   Vitals:   08/05/21 1529  BP: 140/80  Pulse: 60  Temp: 98.3 F (36.8 C)  SpO2: 93%   BP Readings from Last 3 Encounters:  08/05/21 140/80  04/16/21 134/80  03/08/21 130/80   Wt Readings from Last 3 Encounters:  08/05/21 154 lb (69.9 kg)  04/16/21 154 lb (69.9 kg)  03/08/21 152 lb 12.8 oz (69.3 kg)   Body mass index is 24.86 kg/m.   Physical Exam    Constitutional: Appears well-developed and well-nourished. No distress.  Head: Normocephalic and atraumatic.  Neck: Neck supple. No tracheal deviation present. No thyromegaly present.  No cervical lymphadenopathy Cardiovascular: Normal rate, regular rhythm and normal heart sounds.  3/6  systolic  murmur heard. No carotid bruit .  1 + pitting b/l ankle edema Pulmonary/Chest: Effort normal and breath sounds normal. No respiratory distress. No has no wheezes. No rales.  Skin: Skin is warm and dry. Not diaphoretic.  Psychiatric: Normal mood and affect. Behavior is normal.       Assessment & Plan:    See Problem List for Assessment and Plan of chronic medical problems.

## 2021-08-04 NOTE — Patient Instructions (Addendum)
    Have Blood work and chest xray downstairs.     Medications changes include :  none   Your prescription(s) have been submitted to your pharmacy. Please take as directed and contact our office if you believe you are having problem(s) with the medication(s).   A Echo was ordered.  Someone from their office will call you to schedule an appointment.    A referral was sent for cardiology for follow up - they will call you.

## 2021-08-05 ENCOUNTER — Ambulatory Visit (INDEPENDENT_AMBULATORY_CARE_PROVIDER_SITE_OTHER): Payer: Medicare Other | Admitting: Internal Medicine

## 2021-08-05 ENCOUNTER — Other Ambulatory Visit: Payer: Self-pay

## 2021-08-05 ENCOUNTER — Encounter: Payer: Self-pay | Admitting: Internal Medicine

## 2021-08-05 ENCOUNTER — Ambulatory Visit (INDEPENDENT_AMBULATORY_CARE_PROVIDER_SITE_OTHER): Payer: Medicare Other

## 2021-08-05 VITALS — BP 140/80 | HR 60 | Temp 98.3°F | Ht 66.0 in | Wt 154.0 lb

## 2021-08-05 DIAGNOSIS — I359 Nonrheumatic aortic valve disorder, unspecified: Secondary | ICD-10-CM

## 2021-08-05 DIAGNOSIS — R0609 Other forms of dyspnea: Secondary | ICD-10-CM | POA: Diagnosis not present

## 2021-08-05 DIAGNOSIS — I5032 Chronic diastolic (congestive) heart failure: Secondary | ICD-10-CM | POA: Diagnosis not present

## 2021-08-05 DIAGNOSIS — I422 Other hypertrophic cardiomyopathy: Secondary | ICD-10-CM | POA: Diagnosis not present

## 2021-08-05 DIAGNOSIS — I10 Essential (primary) hypertension: Secondary | ICD-10-CM

## 2021-08-05 DIAGNOSIS — J90 Pleural effusion, not elsewhere classified: Secondary | ICD-10-CM | POA: Diagnosis not present

## 2021-08-05 DIAGNOSIS — I2510 Atherosclerotic heart disease of native coronary artery without angina pectoris: Secondary | ICD-10-CM

## 2021-08-05 DIAGNOSIS — I5033 Acute on chronic diastolic (congestive) heart failure: Secondary | ICD-10-CM | POA: Insufficient documentation

## 2021-08-05 DIAGNOSIS — R0602 Shortness of breath: Secondary | ICD-10-CM | POA: Diagnosis not present

## 2021-08-05 DIAGNOSIS — I517 Cardiomegaly: Secondary | ICD-10-CM | POA: Diagnosis not present

## 2021-08-05 NOTE — Assessment & Plan Note (Signed)
Chronic Has some mild B/L LE edema but this is chronic and stable Lungs clear Euvolemic Continue lasix 20 mg daily

## 2021-08-05 NOTE — Assessment & Plan Note (Signed)
Chronic ? Worse- increased DOE Echo ordered Referred for f/u with cardio

## 2021-08-05 NOTE — Assessment & Plan Note (Signed)
Chronic BP well controlled Continue losartan 25 mg daily, metoprolol 25 mg bid cmp

## 2021-08-05 NOTE — Assessment & Plan Note (Signed)
Chronic Check echo for f/u

## 2021-08-05 NOTE — Assessment & Plan Note (Signed)
New Started moths ago - has been getting worse ? Cardiac - HOCM, valvular disease, diastolic dysfunction Cbc, cmp Echo ordered cxr today F/ with cardio - referral ordered for f/u No change in medications

## 2021-08-06 ENCOUNTER — Other Ambulatory Visit: Payer: Self-pay | Admitting: Internal Medicine

## 2021-08-06 LAB — COMPREHENSIVE METABOLIC PANEL
ALT: 12 U/L (ref 0–53)
AST: 18 U/L (ref 0–37)
Albumin: 4.2 g/dL (ref 3.5–5.2)
Alkaline Phosphatase: 41 U/L (ref 39–117)
BUN: 25 mg/dL — ABNORMAL HIGH (ref 6–23)
CO2: 28 mEq/L (ref 19–32)
Calcium: 9.4 mg/dL (ref 8.4–10.5)
Chloride: 106 mEq/L (ref 96–112)
Creatinine, Ser: 1.12 mg/dL (ref 0.40–1.50)
GFR: 57.34 mL/min — ABNORMAL LOW (ref >60.00)
Glucose, Bld: 79 mg/dL (ref 70–99)
Potassium: 4.7 mEq/L (ref 3.5–5.1)
Sodium: 142 mEq/L (ref 135–145)
Total Bilirubin: 2.5 mg/dL — ABNORMAL HIGH (ref 0.2–1.2)
Total Protein: 6.7 g/dL (ref 6.0–8.3)

## 2021-08-06 LAB — CBC WITH DIFFERENTIAL/PLATELET
Basophils Absolute: 0.1 10*3/uL (ref 0.0–0.1)
Basophils Relative: 1.3 % (ref 0.0–3.0)
Eosinophils Absolute: 0.1 10*3/uL (ref 0.0–0.7)
Eosinophils Relative: 1.4 % (ref 0.0–5.0)
HCT: 38.7 % — ABNORMAL LOW (ref 39.0–52.0)
Hemoglobin: 12.5 g/dL — ABNORMAL LOW (ref 13.0–17.0)
Lymphocytes Relative: 15.7 % (ref 12.0–46.0)
Lymphs Abs: 0.9 10*3/uL (ref 0.7–4.0)
MCHC: 32.2 g/dL (ref 30.0–36.0)
MCV: 96.4 fl (ref 78.0–100.0)
Monocytes Absolute: 0.6 10*3/uL (ref 0.1–1.0)
Monocytes Relative: 9.9 % (ref 3.0–12.0)
Neutro Abs: 4.2 10*3/uL (ref 1.4–7.7)
Neutrophils Relative %: 71.7 % (ref 43.0–77.0)
Platelets: 116 10*3/uL — ABNORMAL LOW (ref 150.0–400.0)
RBC: 4.02 Mil/uL — ABNORMAL LOW (ref 4.22–5.81)
RDW: 16.6 % — ABNORMAL HIGH (ref 11.5–15.5)
WBC: 5.9 10*3/uL (ref 4.0–10.5)

## 2021-08-07 ENCOUNTER — Other Ambulatory Visit (HOSPITAL_COMMUNITY): Payer: Medicare Other

## 2021-08-07 ENCOUNTER — Encounter (HOSPITAL_COMMUNITY): Payer: Self-pay | Admitting: Internal Medicine

## 2021-09-05 ENCOUNTER — Other Ambulatory Visit: Payer: Self-pay

## 2021-09-05 ENCOUNTER — Ambulatory Visit (HOSPITAL_COMMUNITY): Payer: Medicare Other | Attending: Cardiology

## 2021-09-05 DIAGNOSIS — I422 Other hypertrophic cardiomyopathy: Secondary | ICD-10-CM | POA: Insufficient documentation

## 2021-09-05 DIAGNOSIS — I10 Essential (primary) hypertension: Secondary | ICD-10-CM | POA: Diagnosis not present

## 2021-09-05 DIAGNOSIS — I359 Nonrheumatic aortic valve disorder, unspecified: Secondary | ICD-10-CM | POA: Insufficient documentation

## 2021-09-05 DIAGNOSIS — R0609 Other forms of dyspnea: Secondary | ICD-10-CM | POA: Insufficient documentation

## 2021-09-06 LAB — ECHOCARDIOGRAM COMPLETE
AR max vel: 1.38 cm2
AV Area VTI: 1.31 cm2
AV Area mean vel: 1.33 cm2
AV Mean grad: 25 mmHg
AV Peak grad: 45.2 mmHg
Ao pk vel: 3.36 m/s
Area-P 1/2: 4.72 cm2
MV M vel: 6.34 m/s
MV Peak grad: 160.5 mmHg
P 1/2 time: 621 msec
S' Lateral: 2.95 cm

## 2021-09-08 ENCOUNTER — Encounter: Payer: Self-pay | Admitting: Internal Medicine

## 2021-09-08 NOTE — Progress Notes (Signed)
Subjective:    Patient ID: Paul Mays, male    DOB: 02/28/1928, 87 y.o.   MRN: [id number]  This visit occurred during the SARS-CoV-2 public health emergency.  Safety protocols were in place, including screening questions prior to the visit, additional usage of staff PPE, and extensive cleaning of exam room while observing appropriate contact time as indicated for disinfecting solutions.     HPI The patient is here for follow up of their chronic medical problems, including htn, hypertrophic CMP, severe AS, HFpEF, leg edema, hld, gout, peripheral neuropathy  Recent Echo reviewed.    When he walks 20 feet he gets very SOB and has to sit down.  In the house if he goes 10-15 feet he has to sit down.  His leg edema is well controlled.  He does have occasional palpitations and does note lightheadedness.  On the way here today he was lightheaded and was not sure if he was going to be able to make it.  He denies any chest pain.   Medications and allergies reviewed with patient and updated if appropriate.  Patient Active Problem List   Diagnosis Date Noted   Dyspnea on exertion 08/05/2021   Chronic diastolic (congestive) heart failure ([place]) 08/05/2021   Cat scratch of right lower leg 04/16/2021   Lower extremity ulceration ([place]) 03/08/2021   Weight loss 02/19/2021   Lightheadedness 63/87/5643   Periumbilical pain 32/95/1884   Hand laceration 11/06/2020   Fall 08/21/2020   Head trauma, initial encounter 08/21/2020   Bilateral leg edema 01/26/2020   GERD (gastroesophageal reflux disease) 01/26/2020   Chronic pain of both shoulders 12/10/2018   Easy bruising 03/20/2018   Decreased hearing of right ear 09/04/2017   Rotator cuff tear arthropathy of both shoulders 10/18/2015   Thrombocytopenia ([place]) 09/24/2015   Diverticulosis of colon without hemorrhage 07/02/2014   Hypertrophic cardiomyopathy ([place]) 03/24/2011   OBSTRUCTIVE SLEEP APNEA 11/08/2010   HYPERTENSION, PULMONARY  09/16/2010   HEARING LOSS, BILATERAL 06/18/2009   Coronary atherosclerosis 03/07/2009   Aortic valve disorder, mod AR, mod AS 03/07/2009   CAROTID STENOSIS 03/07/2009   Hyperlipidemia 11/15/2008   Peripheral neuropathy, idiopathic 11/15/2008   Essential hypertension 11/15/2008   ARTHRALGIA 11/15/2008   COLITIS, HX OF 11/15/2008   VENOUS INSUFFICIENCY 04/20/2007   Gout 02/10/2007    Current Outpatient Medications on File Prior to Visit  Medication Sig Dispense Refill   allopurinol (ZYLOPRIM) 300 MG tablet TAKE 1 TABLET(300 MG) BY MOUTH DAILY AS NEEDED FOR GOUT FLARE 90 tablet 0   aspirin EC 81 MG tablet Take 1 tablet (81 mg total) by mouth daily.     Cholecalciferol (VITAMIN D3) 50 MCG (2000 UT) capsule Take 2,000 Units by mouth daily.     furosemide (LASIX) 20 MG tablet Take 1 tablet (20 mg total) by mouth daily. 90 tablet 1   gabapentin (NEURONTIN) 600 MG tablet Take 1 tablet (600 mg total) by mouth 2 (two) times daily. 180 tablet 1   hydrocortisone 2.5 % cream Apply topically 2 (two) times daily.     losartan (COZAAR) 25 MG tablet TAKE 1 TABLET(25 MG) BY MOUTH DAILY 90 tablet 1   metoprolol tartrate (LOPRESSOR) 25 MG tablet Take 1 tablet (25 mg total) by mouth 2 (two) times daily. 180 tablet 3   pravastatin (PRAVACHOL) 40 MG tablet Take 40 mg by mouth daily.     No current facility-administered medications on file prior to visit.    Past Medical History:  Diagnosis Date   Anxiety    Aortic stenosis    Arthralgia    Arthritis    CAD (coronary artery disease)    Carotid stenosis    Cervicalgia    CLUSTER HEADACHE 02/10/2007   Qualifier: Diagnosis of  By: Linna Darner MD, Gwyndolyn Saxon     Colitis    Edema    Gout    Hearing loss    Herpes zoster without mention of complication    Hyperlipidemia    Hypertension    Hyperuricemia    Impacted cerumen    Migraine    Migraine    Murmur    Olecranon bursitis    OSA (obstructive sleep apnea)    Other and unspecified hyperlipidemia     Other malaise and fatigue    Peripheral neuropathy    Polyneuropathy    Pulmonary hypertension ([place])    Unspecified hereditary and idiopathic peripheral neuropathy    Venous insufficiency     Past Surgical History:  Procedure Laterality Date   ANGIOPLASTY     w/ 2 stents in [place] History   Socioeconomic History   Marital status: Married    Spouse name: [name]   Number of children: 2   Years of education: college   Highest education level: Not on file  Occupational History   Occupation: retired    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 Patient finished  two years of college.    Pt has children.   Caffeine -two cups daily.   Left handed.   Retired Licensed conveyancer                    Social Determinants of Radio broadcast assistant Strain: Low Risk    Difficulty of Paying Living Expenses: Not hard at all  Food Insecurity: No Food Insecurity   Worried About Charity fundraiser in the Last Year: Never true   Arboriculturist in the Last Year: Never true  Transportation Needs: No Transportation Needs   Lack of Transportation (Medical): No   Lack of Transportation (Non-Medical): No  Physical Activity: Sufficiently Active   Days of Exercise per Week: 5 days   Minutes of Exercise per Session: 30 min  Stress: No Stress Concern Present   Feeling of Stress : Not at all  Social Connections: Socially Integrated   Frequency of Communication with Friends and Family: More than three  times a week   Frequency of Social Gatherings with Friends and Family: More than three times a week   Attends Religious Services: More than 4 times per year   Active Member of [employer] or Organizations: Yes   Attends Music therapist: More than 4 times per year   Marital Status: Married    Family History  Problem Relation Age of Onset   Stroke Father  Died, 83   High blood pressure Father    Diabetes Father    Other Mother        Died, 52   Diabetes Brother        Died, 82   Diabetes Sister        Living, 96   Healthy Daughter    Healthy Son     Review of Systems  Constitutional:  Negative for fever.  Respiratory:  Positive for shortness of breath. Negative for cough and wheezing.   Cardiovascular:  Positive for palpitations (1-2 times a day). Negative for chest pain and leg swelling (Controlled).  Neurological:  Positive for light-headedness. Negative for dizziness and headaches.      Objective:   Vitals:   09/09/21 1401  BP: 140/80  Pulse: 66  Temp: 98.3 F (36.8 C)  SpO2: 95%   BP Readings from Last 3 Encounters:  09/09/21 140/80  08/05/21 140/80  04/16/21 134/80   Wt Readings from Last 3 Encounters:  09/09/21 145 lb (65.8 kg)  08/05/21 154 lb (69.9 kg)  04/16/21 154 lb (69.9 kg)   Body mass index is 23.4 kg/m.   Physical Exam    Constitutional: Appears well-developed and well-nourished. No distress.  HENT:  Head: Normocephalic and atraumatic.  Neck: Neck supple. No tracheal deviation present. No thyromegaly present.  No cervical lymphadenopathy Cardiovascular: Normal rate, regular rhythm and normal heart sounds.   No murmur heard. No carotid bruit .  No edema Pulmonary/Chest: Effort normal and breath sounds normal. No respiratory distress. No has no wheezes. No rales.  Skin: Skin is warm and dry. Not diaphoretic.  Psychiatric: Normal mood and affect. Behavior is normal.    ECHOCARDIOGRAM COMPLETE    ECHOCARDIOGRAM REPORT        Patient Name:   NEERAJ HOUSAND Date of Exam: 09/05/2021 Medical Rec #:  [id number]      Height:       66.0 in Accession #:    [id number]     Weight:       154.0 lb Date of Birth:  [birth year]       BSA:          1.790 m Patient Age:    71 years       BP:           140/80 mmHg Patient Gender: M              HR:           52 bpm. Exam Location:  [facility]  Procedure: 2D Echo, 3D Echo, Cardiac Doppler, Color Doppler and Strain Analysis  Indications:    I42.1 Hypertrophic Cardiomyopathy   History:        Patient has prior history of Echocardiogram examinations, most                 recent 09/13/2019. Hypertrophic Cardiomyopathy, CAD, Pulmonary                 HTN, Signs/Symptoms:Dyspnea, Edema and Shortness of Breath; Risk                 Factors:Hypertension, Dyslipidemia and Sleep Apnea.   Sonographer:    Deliah Boston RDCS Referring Phys: Claudina Lick Azekiel Cremer  IMPRESSIONS   1. The aortic valve is severely calcified with restricted movement in systole. Visually, there is concern for severe aortic stenosis. Vmax 3.4 m/s, MG 25 mmHG. The AVA and DI are overestimated in the setting of moderate aortic regurgitation.  Would  recommend TEE for clarification of AS/AI severity. The aortic valve is tricuspid. There is severe calcifcation of the aortic valve. There is severe thickening of the aortic valve. Aortic valve regurgitation is moderate.  2. There is severe asymmetric hypertrophy of the basal septum consistent with prior history of hypertrophic cardiomyopathy. No signs of mitral valve SAM or outflow tract obstruction. Left ventricular ejection fraction, by estimation, is 50 to 55%. Left  ventricular ejection fraction by 3D volume is 52 %. The left ventricle has low normal function. The left ventricle has no regional wall motion abnormalities. There is severe asymmetric left ventricular hypertrophy of the basal-septal segment. Left  ventricular diastolic parameters are consistent with Grade III  diastolic dysfunction (restrictive). Elevated left ventricular end-diastolic pressure. The E/e' is 23.7.  3. Right ventricular systolic function is moderately reduced. The right ventricular size is moderately enlarged. There is severely elevated pulmonary artery systolic pressure. The estimated right ventricular systolic pressure is 66.4 mmHg.  4. Left atrial size was severely dilated.  5. Right atrial size was severely dilated.  6. The mitral valve is degenerative. Moderate mitral valve regurgitation.  7. The inferior vena cava is dilated in size with <50% respiratory variability, suggesting right atrial pressure of 15 mmHg.  Comparison(s): Changes from prior study are noted. Concerns for severe AS as detailed above.  FINDINGS  Left Ventricle: There is severe asymmetric hypertrophy of the basal septum consistent with prior history of hypertrophic cardiomyopathy. No signs of mitral valve SAM or outflow tract obstruction. Left ventricular ejection fraction, by estimation, is 50  to 55%. Left ventricular ejection fraction by 3D volume is 52 %. The left ventricle has low normal function. The left ventricle has no regional wall motion abnormalities. Global longitudinal strain performed but not reported based on interpreter  judgement due to suboptimal tracking. The left ventricular internal cavity size was normal in size. There is severe asymmetric left ventricular hypertrophy of the basal-septal segment. Left ventricular diastolic parameters are consistent with Grade III  diastolic dysfunction (restrictive). Elevated left ventricular end-diastolic pressure. The E/e' is 23.7.  Right Ventricle: The right ventricular size is moderately enlarged. No increase in right ventricular wall thickness. Right ventricular systolic function is moderately reduced. There is severely elevated pulmonary artery systolic pressure. The tricuspid  regurgitant velocity is 4.21 m/s, and with an assumed right atrial pressure of  15 mmHg, the estimated right ventricular systolic pressure is 40.3 mmHg.  Left Atrium: Left atrial size was severely dilated.  Right Atrium: Right atrial size was severely dilated.  Pericardium: Trivial pericardial effusion is present.  Mitral Valve: The mitral valve is degenerative in appearance. Moderate mitral valve regurgitation.  Tricuspid Valve: The tricuspid valve is grossly normal. Tricuspid valve regurgitation is mild . No evidence of tricuspid stenosis.  Aortic Valve: The aortic valve is severely calcified with restricted movement in systole. Visually, there is concern for severe aortic stenosis. Vmax 3.4 m/s, MG 25 mmHG. The AVA and DI are overestimated in the setting of moderate aortic regurgitation.  Would recommend TEE for clarification of AS/AI severity. The aortic valve is tricuspid. There is severe calcifcation of the aortic valve. There is severe thickening of the aortic valve. Aortic valve regurgitation is moderate. Aortic regurgitation PHT  measures 621 msec. Aortic valve mean gradient measures 25.0 mmHg. Aortic valve peak gradient measures 45.2 mmHg. Aortic valve area, by VTI measures 1.31 cm.  Pulmonic Valve: The pulmonic valve was grossly normal. Pulmonic valve regurgitation is mild. No evidence of pulmonic stenosis.  Aorta: The aortic root and ascending aorta are structurally normal, with no evidence of dilitation.  Venous: The inferior vena cava is dilated in size with less than 50% respiratory variability, suggesting right atrial pressure of 15 mmHg.  IAS/Shunts: The atrial septum is grossly normal.    LEFT VENTRICLE PLAX 2D LVIDd:         4.80 cm         Diastology LVIDs:         2.95 cm         LV e' medial:    5.03 cm/s LV PW:         1.15 cm         LV E/e' medial:  23.5 LV IVS:        1.70 cm         LV e' lateral:   6.65 cm/s LVOT diam:     2.10 cm         LV E/e' lateral: 17.8 LV SV:         110 LV SV Index:   61              2D LVOT Area:     3.46  cm        Longitudinal                                Strain                                2D Strain GLS  17.9 %                                (A2C):                                2D Strain GLS  18.3 %                                (A3C):                                2D Strain GLS  16.0 %                                (A4C):                                2D Strain GLS  17.4 %                                Avg:                                  3D Volume EF                                LV 3D EF:  Left                                             ventricul                                             ar                                             ejection                                             fraction                                             by 3D                                             volume is                                             52 %.                                  3D Volume EF:                                3D EF:        52 %                                LV EDV:       200 ml                                LV ESV:       97 ml                                LV SV:        103 ml  RIGHT VENTRICLE RV S prime:     14.40 cm/s TAPSE (M-mode): 1.8 cm  LEFT ATRIUM              Index        RIGHT ATRIUM           Index LA diam:        5.65 cm  3.16 cm/m   RA Area:     31.80 cm LA Vol (  A2C):   128.0 ml 71.53 ml/m  RA Volume:   125.00 ml 69.85 ml/m LA Vol (A4C):   158.0 ml 88.29 ml/m LA Biplane Vol: 149.0 ml 83.26 ml/m  AORTIC VALVE AV Area (Vmax):    1.38 cm AV Area (Vmean):   1.33 cm AV Area (VTI):     1.31 cm AV Vmax:           336.00 cm/s AV Vmean:          214.500 cm/s AV VTI:            0.834 m AV Peak Grad:      45.2 mmHg AV Mean Grad:      25.0 mmHg LVOT Vmax:         134.00 cm/s LVOT Vmean:        82.125 cm/s LVOT VTI:          0.316 m LVOT/AV VTI ratio: 0.38 AI PHT:            621 msec   AORTA Ao Root diam: 3.50 cm Ao  Asc diam:  3.60 cm  MITRAL VALVE                TRICUSPID VALVE MV Area (PHT): cm          TR Peak grad:   70.9 mmHg MV Decel Time: 161 msec     TR Vmax:        421.00 cm/s MR Peak grad: 160.5 mmHg MR Mean grad: 110.0 mmHg    SHUNTS MR Vmax:      633.50 cm/s   Systemic VTI:  0.32 m MR Vmean:     497.0 cm/s    Systemic Diam: 2.10 cm MV E velocity: 118.33 cm/s MV A velocity: 54.70 cm/s MV E/A ratio:  2.16  Eleonore Chiquito MD Electronically signed by Eleonore Chiquito MD Signature Date/Time: 09/06/2021/10:22:52 AM      Final     Assessment & Plan:    See Problem List for Assessment and Plan of chronic medical problems.

## 2021-09-09 ENCOUNTER — Ambulatory Visit (INDEPENDENT_AMBULATORY_CARE_PROVIDER_SITE_OTHER): Payer: Medicare Other | Admitting: Internal Medicine

## 2021-09-09 ENCOUNTER — Other Ambulatory Visit: Payer: Self-pay

## 2021-09-09 VITALS — BP 140/80 | HR 66 | Temp 98.3°F | Ht 66.0 in | Wt 145.0 lb

## 2021-09-09 DIAGNOSIS — M109 Gout, unspecified: Secondary | ICD-10-CM

## 2021-09-09 DIAGNOSIS — I5032 Chronic diastolic (congestive) heart failure: Secondary | ICD-10-CM | POA: Diagnosis not present

## 2021-09-09 DIAGNOSIS — I2510 Atherosclerotic heart disease of native coronary artery without angina pectoris: Secondary | ICD-10-CM

## 2021-09-09 DIAGNOSIS — I10 Essential (primary) hypertension: Secondary | ICD-10-CM | POA: Diagnosis not present

## 2021-09-09 DIAGNOSIS — E7849 Other hyperlipidemia: Secondary | ICD-10-CM

## 2021-09-09 DIAGNOSIS — R600 Localized edema: Secondary | ICD-10-CM | POA: Diagnosis not present

## 2021-09-09 DIAGNOSIS — I359 Nonrheumatic aortic valve disorder, unspecified: Secondary | ICD-10-CM

## 2021-09-09 NOTE — Assessment & Plan Note (Addendum)
Chronic Blood pressure well controlled Continue losartan 25 mg daily, metoprolol 25 mg twice daily 

## 2021-09-09 NOTE — Assessment & Plan Note (Signed)
Chronic Recent echocardiogram showed restrictive diastolic dysfunction grade 3 He is experiencing shortness of breath and lightheadedness Appears to be euvolemic Continue Lasix 20 mg daily

## 2021-09-09 NOTE — Assessment & Plan Note (Signed)
Chronic Has noted increased dyspnea on exertion and some lightheadedness with exertion Recent echo shows possible severe AS Has an appoint with cardiology in [place] will see if he can be seen sooner since he is symptomatic He states he would consider valve replacement if it was necessary

## 2021-09-09 NOTE — Assessment & Plan Note (Signed)
Chronic Continue pravastatin 40 mg daily 

## 2021-09-09 NOTE — Assessment & Plan Note (Signed)
Chronic Controlled, Stable Continue Lasix 20 mg daily

## 2021-09-09 NOTE — Patient Instructions (Addendum)
    Cardiology will call you to rescheduled your appointment with them to a sooner appointment.    Medications changes include :   none    Please followup in 6 months

## 2021-09-09 NOTE — Assessment & Plan Note (Signed)
Chronic Controlled, Stable Continue allopurinol 300 mg as needed-this is regimen he has been on so we will not change at this time

## 2021-09-10 ENCOUNTER — Telehealth: Payer: Self-pay

## 2021-09-10 NOTE — Telephone Encounter (Signed)
Per Dr. Quay Burow, scheduled the patient for TAVR consult with Dr. Burt Knack 09/12/2021. The patient was grateful for call and agrees with plan.

## 2021-09-10 NOTE — Telephone Encounter (Signed)
-----   Message from Sherren Mocha, MD sent at 09/10/2021  1:24 PM EST ----- Regarding: RE: possible severe AS on echo, SOB Yes will do, thanks  Lauren or Valetta Fuller - can you get him added on for one of my upcoming clinics? thx ----- Message ----- From: Binnie Rail, MD Sent: 09/09/2021   2:36 PM EST To: Sharmon Revere, Sherren Mocha, MD Subject: possible severe AS on echo, SOB                Paul Mays has been having increased DOE, limiting his activities.  He also states lightheadedness. Recent Echo shows possible severe AS.   He has an appt on 2/8 - can you guys get him any sooner?   Thank you, Binnie Rail, MD

## 2021-09-12 ENCOUNTER — Other Ambulatory Visit: Payer: Self-pay

## 2021-09-12 ENCOUNTER — Encounter: Payer: Self-pay | Admitting: Cardiovascular Disease

## 2021-09-12 ENCOUNTER — Other Ambulatory Visit: Payer: Self-pay | Admitting: *Deleted

## 2021-09-12 ENCOUNTER — Ambulatory Visit (INDEPENDENT_AMBULATORY_CARE_PROVIDER_SITE_OTHER): Payer: Medicare Other | Admitting: Cardiovascular Disease

## 2021-09-12 VITALS — BP 132/70 | HR 52 | Ht 66.0 in | Wt 145.2 lb

## 2021-09-12 DIAGNOSIS — I350 Nonrheumatic aortic (valve) stenosis: Secondary | ICD-10-CM | POA: Diagnosis not present

## 2021-09-12 DIAGNOSIS — I2510 Atherosclerotic heart disease of native coronary artery without angina pectoris: Secondary | ICD-10-CM

## 2021-09-12 LAB — CBC
Hematocrit: 37.3 % — ABNORMAL LOW (ref 37.5–51.0)
Hemoglobin: 12.5 g/dL — ABNORMAL LOW (ref 13.0–17.7)
MCH: 31.2 pg (ref 26.6–33.0)
MCHC: 33.5 g/dL (ref 31.5–35.7)
MCV: 93 fL (ref 79–97)
Platelets: 120 10*3/uL — ABNORMAL LOW (ref 150–450)
RBC: 4.01 x10E6/uL — ABNORMAL LOW (ref 4.14–5.80)
RDW: 15.3 % (ref 11.6–15.4)
WBC: 4.8 10*3/uL (ref 3.4–10.8)

## 2021-09-12 LAB — BASIC METABOLIC PANEL
BUN/Creatinine Ratio: 22 (ref 10–24)
BUN: 24 mg/dL (ref 10–36)
CO2: 28 mmol/L (ref 20–29)
Calcium: 9.7 mg/dL (ref 8.6–10.2)
Chloride: 105 mmol/L (ref 96–106)
Creatinine, Ser: 1.1 mg/dL (ref 0.76–1.27)
Glucose: 90 mg/dL (ref 70–99)
Potassium: 5.3 mmol/L — ABNORMAL HIGH (ref 3.5–5.2)
Sodium: 142 mmol/L (ref 134–144)
eGFR: 63 mL/min/{1.73_m2} (ref >59)

## 2021-09-12 NOTE — Patient Instructions (Addendum)
Medication Instructions:  Your physician recommends that you continue on your current medications as directed. Please refer to the Current Medication list given to you today.  *If you need a refill on your cardiac medications before your next appointment, please call your pharmacy*   Lab Work: TODAY:  BMET & CBC  If you have labs (blood work) drawn today and your tests are completely normal, you will receive your results only by: [place] (if you have MyChart) OR A paper copy in the mail If you have any lab test that is abnormal or we need to change your treatment, we will call you to review the results.   Testing/Procedures: Your physician has requested that you have a cardiac catheterization. Cardiac catheterization is used to diagnose and/or treat various heart conditions. Doctors may recommend this procedure for a number of different reasons. The most common reason is to evaluate chest pain. Chest pain can be a symptom of coronary artery disease (CAD), and cardiac catheterization can show whether plaque is narrowing or blocking your heart's arteries. This procedure is also used to evaluate the valves, as well as measure the blood flow and oxygen levels in different parts of your heart. For further information please visit [email or web address]. Please follow instruction sheet,  BELOW:   [facility] [place], [place] [address] Dept: [phone] Loc: [place]  09/12/2021  You are scheduled for a Cardiac Catheterization on Wednesday, December 28 with Dr. Sherren Mocha.  1. Please arrive at the [facility] (Main Entrance A) at [facility]: [address] [place], [place] [address] at 9:30 AM (This time is two hours before your procedure to ensure your preparation). Free valet parking service is available.   Special note: Every effort is made to have  your procedure done on time. Please understand that emergencies sometimes delay scheduled procedures.  2. Diet: Do not eat solid foods after midnight.  The patient may have clear liquids until 5am upon the day of the procedure.  3. Labs:  TODAY  4. Medication instructions in preparation for your procedure:   Contrast Allergy: No  On the morning of your procedure, take your Aspirin and any morning medicines NOT listed above.  You may use sips of water.  5. Plan for one night stay--bring personal belongings. 6. Bring a current list of your medications and current insurance cards. 7. You MUST have a responsible person to drive you home. 8. Someone MUST be with you the first 24 hours after you arrive home or your discharge will be delayed. 9. Please wear clothes that are easy to get on and off and wear slip-on shoes.  Thank you for allowing [name] to care for you!   -- [facility]     Follow-Up: At [facility], you and your health needs are our priority.  As part of our continuing mission to provide you with exceptional heart care, we have created designated Provider Care Teams.  These Care Teams include your primary Cardiologist (physician) and Advanced Practice Providers (APPs -  Physician Assistants and Nurse Practitioners) who all work together to provide you with the care you need, when you need it.  We recommend signing up for the patient portal called "MyChart".  Sign up information is provided on this After Visit Summary.  MyChart is used to connect with patients for Virtual Visits (Telemedicine).  Patients are able  to view lab/test results, encounter notes, upcoming appointments, etc.  Non-urgent messages can be sent to your provider as well.   To learn more about what you can do with MyChart, go to [email or web address].    Your next appointment:   They will call you   The format for your next appointment:     Provider:       Other  Instructions

## 2021-09-12 NOTE — Progress Notes (Addendum)
Cardiology Office Note:    Date:  09/15/2021   ID:  Paul Mays, DOB Dec 21, 1927, MRN [id number]  PCP:  Binnie Rail, MD   [facility] Providers Cardiologist:  Sherren Mocha, MD     Referring MD: Binnie Rail, MD   Chief Complaint  Patient presents with   Shortness of Breath   History of Present Illness:    Paul Mays is a 87 y.o. male with history of coronary artery disease, presenting for follow-up evaluation.  Paul Mays is here alone today.  He was last seen in our office by [name] in October 2021.  The patient underwent remote PCI of the RCA and left circumflex in 1999.  He has a history of left ventricular hypertrophy with septal hypertrophy and LV outflow tract obstruction.  Other problems include hypertension, hyperlipidemia, sleep apnea, peripheral neuropathy, and pulmonary hypertension.  At the time of his last visit, he was felt to be doing well.  He had minimal cardiac symptoms.  However, over the past 6 months he has developed progressive symptoms of exertional dyspnea and fatigue.  A recent echocardiogram has been suggestive of severe aortic stenosis and he presents today for further discussion of diagnostic and treatment options.  The patient denies near syncope or frank syncope.  He has mild chronic orthopnea but no PND.  He denies leg edema or abdominal swelling.  Past Medical History:  Diagnosis Date   Anxiety    Aortic stenosis    Arthralgia    Arthritis    CAD (coronary artery disease)    Carotid stenosis    Cervicalgia    CLUSTER HEADACHE 02/10/2007   Qualifier: Diagnosis of  By: Linna Darner MD, Gwyndolyn Saxon     Colitis    Edema    Gout    Hearing loss    Herpes zoster without mention of complication    Hyperlipidemia    Hypertension    Hyperuricemia    Impacted cerumen    Migraine    Migraine    Murmur    Olecranon bursitis    OSA (obstructive sleep apnea)    Other and unspecified hyperlipidemia    Other malaise and fatigue    Peripheral  neuropathy    Polyneuropathy    Pulmonary hypertension (HCC)    Unspecified hereditary and idiopathic peripheral neuropathy    Venous insufficiency     Past Surgical History:  Procedure Laterality Date   ANGIOPLASTY     w/ 2 stents in [place]      Current Medications: Current Meds  Medication Sig   allopurinol (ZYLOPRIM) 300 MG tablet TAKE 1 TABLET(300 MG) BY MOUTH DAILY AS NEEDED FOR GOUT FLARE   aspirin EC 81 MG tablet Take 1 tablet (81 mg total) by mouth daily.   Cholecalciferol (VITAMIN D3) 50 MCG (2000 UT) capsule Take 2,000 Units by mouth daily.   furosemide (LASIX) 20 MG tablet Take 1 tablet (20 mg total) by mouth daily.   gabapentin (NEURONTIN) 600 MG tablet Take 1 tablet (600 mg total) by mouth 2 (two) times daily.   hydrocortisone 2.5 % cream Apply topically 2 (two) times daily.   metoprolol tartrate (LOPRESSOR) 25 MG tablet Take 1 tablet (25 mg total) by mouth 2 (two) times daily.   pravastatin (PRAVACHOL) 40 MG tablet Take 40 mg by mouth daily.   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 Patient finished  two years of college.     Pt has children.   Caffeine -two cups daily.   Left handed.   Retired Licensed conveyancer                    Social Determinants of Radio broadcast assistant Strain: Low Risk    Difficulty of Paying Living Expenses: Not hard at all  Food Insecurity: No Food Insecurity   Worried About Charity fundraiser in the Last Year: Never true   Arboriculturist in the Last Year: Never true  Transportation Needs: No Transportation Needs   Lack of Transportation (Medical): No   Lack of Transportation (Non-Medical): No  Physical Activity: Sufficiently Active   Days of Exercise per Week: 5 days   Minutes of Exercise per Session: 30 min  Stress: No Stress Concern Present   Feeling of Stress : Not at all  Social Connections: Socially Integrated   Frequency of Communication with Friends and Family: More than three times a week   Frequency of Social Gatherings with Friends and Family: More than three times a week   Attends Religious Services: More than 4 times per year   Active Member of [employer] or Organizations: Yes   Attends Music therapist: More than 4 times per year   Marital Status: Married     Family History: The patient's family history includes Diabetes in his brother, father, and sister; Healthy in his daughter and son; High blood pressure in his father; Other in his mother; Stroke in his father.  ROS:   Please see the history of present illness.    All other systems reviewed and are negative.  EKGs/Labs/Other Studies Reviewed:    The following studies were reviewed today: Echo:  1. The aortic valve is severely calcified with restricted movement in  systole. Visually, there is concern for severe aortic stenosis. Vmax 3.4  m/s, MG 25 mmHG. The AVA and DI are overestimated in the setting of  moderate aortic regurgitation. Would  recommend TEE for clarification of AS/AI severity. The aortic valve is  tricuspid. There is severe calcifcation of the aortic valve. There is   severe thickening of the aortic valve. Aortic valve regurgitation is  moderate.   2. There is severe asymmetric hypertrophy of the basal septum consistent  with prior history of hypertrophic cardiomyopathy. No signs of mitral  valve SAM or outflow tract obstruction. Left ventricular ejection  fraction, by estimation, is 50 to 55%. Left  ventricular ejection fraction by 3D volume is 52 %.  The left ventricle has  low normal function. The left ventricle has no regional wall motion  abnormalities. There is severe asymmetric left ventricular hypertrophy of  the basal-septal segment. Left  ventricular diastolic parameters are consistent with Grade III diastolic  dysfunction (restrictive). Elevated left ventricular end-diastolic  pressure. The E/e' is 23.7.   3. Right ventricular systolic function is moderately reduced. The right  ventricular size is moderately enlarged. There is severely elevated  pulmonary artery systolic pressure. The estimated right ventricular  systolic pressure is 67.1 mmHg.   4. Left atrial size was severely dilated.   5. Right atrial size was severely dilated.   6. The mitral valve is degenerative. Moderate mitral valve regurgitation.   7. The inferior vena cava is dilated in size with <50% respiratory  variability, suggesting right atrial pressure of 15 mmHg.   Comparison(s): Changes from prior study are noted. Concerns for severe AS  as detailed above.   EKG:  EKG is ordered today.  The ekg ordered today demonstrates sinus brady 52 bpm, ST-T changes consider anterolateral ischemia  Recent Labs: 02/19/2021: TSH 2.34 08/05/2021: ALT 12 09/12/2021: BUN 24; Creatinine, Ser 1.10; Hemoglobin 12.5; Platelets 120; Potassium 5.3; Sodium 142  Recent Lipid Panel    Component Value Date/Time   CHOL 149 12/09/2017 1457   TRIG 57.0 12/09/2017 1457   HDL 72.30 12/09/2017 1457   CHOLHDL 2 12/09/2017 1457   VLDL 11.4 12/09/2017 1457   LDLCALC 65 12/09/2017 1457   Risk  Assessment/Calculations:     STS for isolated AVR:   Risk of Mortality: 4.506% Renal Failure: 3.886% Permanent Stroke: 2.229% Prolonged Ventilation: 8.447% DSW Infection: 0.072% Reoperation: 5.636% Morbidity or Mortality: 15.864% Short Length of Stay: 22.228% Long Length of Stay: 8.164%     Physical Exam:    VS:  BP 132/70   Pulse (!) 52   Ht 5\' 6"  (1.676 m)   Wt 145 lb 3.2 oz (65.9 kg)   SpO2 95%   BMI 23.44 kg/m     Wt Readings from Last 3 Encounters:  09/12/21 145 lb 3.2 oz (65.9 kg)  09/09/21 145 lb (65.8 kg)  08/05/21 154 lb (69.9 kg)     GEN:  Well nourished, well developed pleasant elderly male in no acute distress HEENT: Normal NECK: No JVD; No carotid bruits LYMPHATICS: No lymphadenopathy CARDIAC: RRR, 3/6 harsh late peaking systolic murmur at the RUSB RESPIRATORY:  Clear to auscultation without rales, wheezing or rhonchi  ABDOMEN: Soft, non-tender, non-distended MUSCULOSKELETAL:  No edema; No deformity  SKIN: Warm and dry NEUROLOGIC:  Alert and oriented x 3 PSYCHIATRIC:  Normal affect   ASSESSMENT:    1. Nonrheumatic aortic valve stenosis    PLAN:    In order of problems listed above:  This is a 87 year old gentleman with probable severe, symptomatic aortic stenosis.  By echo assessment, the aortic valve appears severely restricted and calcified.  The maximum transaortic velocity is 3.4 m/s with peak and mean gradients of 45 and 25 mmHg, respectively.  There is mild to moderate aortic insufficiency as well.  The patient's valve area calculations are probably inaccurate because of the presence of subvalvular obstruction with severe LVH.  The patient may have a combination of senile hypertrophic cardiomyopathy and severe aortic stenosis.  He also male has severe pulmonary hypertension based on his echo study with a peak pulmonary artery pressure greater than 90 mmHg.  This may result from elevated left-sided heart pressures in the setting of severe  aortic stenosis  and diastolic dysfunction, but also could be related to obstructive sleep apnea as this patient has a known history of sleep apnea.  The patient's symptoms are highly suggestive of progressive aortic stenosis as he has developed New York Heart Association functional class III symptoms of both exertional dyspnea and chest pressure.  He has become limited to very low-level activities.  It is possible that he has a combination of both coronary artery disease and aortic stenosis as etiologies of his symptoms.  I have reviewed the natural history of severe aortic stenosis with the patient today.  He understands potential treatment options include palliative medical therapy, transcatheter aortic valve replacement, and conventional surgical aortic valve replacement.  Considering the patient's advanced age, TAVR will likely be the preferred treatment modality in this gentleman.  He is eager to pursue treatment as he has become increasingly limited and he is trying to take care of his elderly wife as well.  I have recommended a right and left heart catheterization and possible PCI for further evaluation. I have reviewed the risks, indications, and alternatives to cardiac catheterization, possible angioplasty, and stenting with the patient. Risks include but are not limited to bleeding, infection, vascular injury, stroke, myocardial infection, arrhythmia, kidney injury, radiation-related injury in the case of prolonged fluoroscopy use, emergency cardiac surgery, and death. The patient understands the risks of serious complication is 1-2 in 1610 with diagnostic cardiac cath and 1-2% or less with angioplasty/stenting.  The patient will also undergo a gated cardiac CTA and a CTA of the chest, abdomen, and pelvis to evaluate anatomic suitability for TAVR.  Once his studies are completed, he will be referred for formal cardiac surgical consultation as part of a multidisciplinary heart valve team approach to his  care.      Shared Decision Making/Informed Consent The risks [stroke (1 in 1000), death (1 in 1000), kidney failure [usually temporary] (1 in 500), bleeding (1 in 200), allergic reaction [possibly serious] (1 in 200)], benefits (diagnostic support and management of coronary artery disease) and alternatives of a cardiac catheterization were discussed in detail with Mr. Schultes and he is willing to proceed.    Medication Adjustments/Labs and Tests Ordered: Current medicines are reviewed at length with the patient today.  Concerns regarding medicines are outlined above.  Orders Placed This Encounter  Procedures   Basic metabolic panel   CBC   EKG 12-Lead    No orders of the defined types were placed in this encounter.   Patient Instructions  Medication Instructions:  Your physician recommends that you continue on your current medications as directed. Please refer to the Current Medication list given to you today.  *If you need a refill on your cardiac medications before your next appointment, please call your pharmacy*   Lab Work: TODAY:  BMET & CBC  If you have labs (blood work) drawn today and your tests are completely normal, you will receive your results only by: [place] (if you have MyChart) OR A paper copy in the mail If you have any lab test that is abnormal or we need to change your treatment, we will call you to review the results.   Testing/Procedures: Your physician has requested that you have a cardiac catheterization. Cardiac catheterization is used to diagnose and/or treat various heart conditions. Doctors may recommend this procedure for a number of different reasons. The most common reason is to evaluate chest pain. Chest pain can be a symptom of coronary artery disease (CAD), and cardiac catheterization can  show whether plaque is narrowing or blocking your heart's arteries. This procedure is also used to evaluate the valves, as well as measure the blood flow  and oxygen levels in different parts of your heart. For further information please visit [email or web address]. Please follow instruction sheet,  BELOW:   [place] OFFICE [place], [place] [address] Dept: [phone] Loc: [place]  09/12/2021  You are scheduled for a Cardiac Catheterization on Wednesday, December 28 with Dr. Sherren Mocha.  1. Please arrive at the [facility] (Main Entrance A) at [facility]: [address] [place], [place] [address] at 9:30 AM (This time is two hours before your procedure to ensure your preparation). Free valet parking service is available.   Special note: Every effort is made to have your procedure done on time. Please understand that emergencies sometimes delay scheduled procedures.  2. Diet: Do not eat solid foods after midnight.  The patient may have clear liquids until 5am upon the day of the procedure.  3. Labs:  TODAY  4. Medication instructions in preparation for your procedure:   Contrast Allergy: No  On the morning of your procedure, take your Aspirin and any morning medicines NOT listed above.  You may use sips of water.  5. Plan for one night stay--bring personal belongings. 6. Bring a current list of your medications and current insurance cards. 7. You MUST have a responsible person to drive you home. 8. Someone MUST be with you the first 24 hours after you arrive home or your discharge will be delayed. 9. Please wear clothes that are easy to get on and off and wear slip-on shoes.  Thank you for allowing [name] to care for you!   -- [facility]     Follow-Up: At [facility] you and your health needs are our priority.  As part of our continuing mission to provide you with exceptional heart care, we have created designated Provider Care Teams.  These Care Teams include your  primary Cardiologist (physician) and Advanced Practice Providers (APPs -  Physician Assistants and Nurse Practitioners) who all work together to provide you with the care you need, when you need it.  We recommend signing up for the patient portal called "MyChart".  Sign up information is provided on this After Visit Summary.  MyChart is used to connect with patients for Virtual Visits (Telemedicine).  Patients are able to view lab/test results, encounter notes, upcoming appointments, etc.  Non-urgent messages can be sent to your provider as well.   To learn more about what you can do with MyChart, go to [email or web address].    Your next appointment:   They will call you   The format for your next appointment:     Provider:       Other Instructions    Signed, Sherren Mocha, MD  09/15/2021 4:25 PM    [facility]

## 2021-09-12 NOTE — H&P (View-Only) (Signed)
Cardiology Office Note:    Date:  09/15/2021   ID:  Paul Mays, DOB [birth year], MRN [id number]  PCP:  Binnie Rail, MD   [facility] Providers Cardiologist:  Sherren Mocha, MD     Referring MD: Binnie Rail, MD   Chief Complaint  Patient presents with   Shortness of Breath    History of Present Illness:    [name] is a 87 y.o. male with history of coronary artery disease, presenting for follow-up evaluation.  Paul Mays is here alone today.  He was last seen in our office by [name] in October 2021.  The patient underwent remote PCI of the RCA and left circumflex in 1999.  He has a history of left ventricular hypertrophy with septal hypertrophy and LV outflow tract obstruction.  Other problems include hypertension, hyperlipidemia, sleep apnea, peripheral neuropathy, and pulmonary hypertension.  At the time of his last visit, he was felt to be doing well.  He had minimal cardiac symptoms.  However, over the past 6 months he has developed progressive symptoms of exertional dyspnea and fatigue.  A recent echocardiogram has been suggestive of severe aortic stenosis and he presents today for further discussion of diagnostic and treatment options.  The patient denies near syncope or frank syncope.  He has mild chronic orthopnea but no PND.  He denies leg edema or abdominal swelling.  Past Medical History:  Diagnosis Date   Anxiety    Aortic stenosis    Arthralgia    Arthritis    CAD (coronary artery disease)    Carotid stenosis    Cervicalgia    CLUSTER HEADACHE 02/10/2007   Qualifier: Diagnosis of  By: Linna Darner MD, Gwyndolyn Saxon     Colitis    Edema    Gout    Hearing loss    Herpes zoster without mention of complication    Hyperlipidemia    Hypertension    Hyperuricemia    Impacted cerumen    Migraine    Migraine    Murmur    Olecranon bursitis    OSA (obstructive sleep apnea)    Other and unspecified hyperlipidemia    Other malaise and fatigue    Peripheral  neuropathy    Polyneuropathy    Pulmonary hypertension (HCC)    Unspecified hereditary and idiopathic peripheral neuropathy    Venous insufficiency     Past Surgical History:  Procedure Laterality Date   ANGIOPLASTY     w/ 2 stents in [place]      Current Medications: Current Meds  Medication Sig   allopurinol (ZYLOPRIM) 300 MG tablet TAKE 1 TABLET(300 MG) BY MOUTH DAILY AS NEEDED FOR GOUT FLARE   aspirin EC 81 MG tablet Take 1 tablet (81 mg total) by mouth daily.   Cholecalciferol (VITAMIN D3) 50 MCG (2000 UT) capsule Take 2,000 Units by mouth daily.   furosemide (LASIX) 20 MG tablet Take 1 tablet (20 mg total) by mouth daily.   gabapentin (NEURONTIN) 600 MG tablet Take 1 tablet (600 mg total) by mouth 2 (two) times daily.   hydrocortisone 2.5 % cream Apply topically 2 (two) times daily.   metoprolol tartrate (LOPRESSOR) 25 MG tablet Take 1 tablet (25 mg total) by mouth 2 (two) times daily.   pravastatin (PRAVACHOL) 40 MG tablet Take 40 mg by mouth daily.   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 Patient finished  two years of college.     Pt has children.   Caffeine -two cups daily.   Left handed.   Retired Licensed conveyancer                    Social Determinants of Radio broadcast assistant Strain: Low Risk    Difficulty of Paying Living Expenses: Not hard at all  Food Insecurity: No Food Insecurity   Worried About Charity fundraiser in the Last Year: Never true   Arboriculturist in the Last Year: Never true  Transportation Needs: No Transportation Needs   Lack of Transportation (Medical): No   Lack of Transportation (Non-Medical): No  Physical Activity: Sufficiently Active   Days of Exercise per Week: 5 days   Minutes of Exercise per Session: 30 min  Stress: No Stress Concern Present   Feeling of Stress : Not at all  Social Connections: Socially Integrated   Frequency of Communication with Friends and Family: More than three times a week   Frequency of Social Gatherings with Friends and Family: More than three times a week   Attends Religious Services: More than 4 times per year   Active Member of [employer] or Organizations: Yes   Attends Music therapist: More than 4 times per year   Marital Status: Married     Family History: The patient's family history includes Diabetes in his brother, father, and sister; Healthy in his daughter and son; High blood pressure in his father; Other in his mother; Stroke in his father.  ROS:   Please see the history of present illness.    All other systems reviewed and are negative.  EKGs/Labs/Other Studies Reviewed:    The following studies were reviewed today: Echo:  1. The aortic valve is severely calcified with restricted movement in  systole. Visually, there is concern for severe aortic stenosis. Vmax 3.4  m/s, MG 25 mmHG. The AVA and DI are overestimated in the setting of  moderate aortic regurgitation. Would  recommend TEE for clarification of AS/AI severity. The aortic valve is  tricuspid. There is severe calcifcation of the aortic valve. There is   severe thickening of the aortic valve. Aortic valve regurgitation is  moderate.   2. There is severe asymmetric hypertrophy of the basal septum consistent  with prior history of hypertrophic cardiomyopathy. No signs of mitral  valve SAM or outflow tract obstruction. Left ventricular ejection  fraction, by estimation, is 50 to 55%. Left  ventricular ejection fraction by 3D volume is  52 %. The left ventricle has  low normal function. The left ventricle has no regional wall motion  abnormalities. There is severe asymmetric left ventricular hypertrophy of  the basal-septal segment. Left  ventricular diastolic parameters are consistent with Grade III diastolic  dysfunction (restrictive). Elevated left ventricular end-diastolic  pressure. The E/e' is 23.7.   3. Right ventricular systolic function is moderately reduced. The right  ventricular size is moderately enlarged. There is severely elevated  pulmonary artery systolic pressure. The estimated right ventricular  systolic pressure is 40.9 mmHg.   4. Left atrial size was severely dilated.   5. Right atrial size was severely dilated.   6. The mitral valve is degenerative. Moderate mitral valve regurgitation.   7. The inferior vena cava is dilated in size with <50% respiratory  variability, suggesting right atrial pressure of 15 mmHg.   Comparison(s): Changes from prior study are noted. Concerns for severe AS  as detailed above.   EKG:  EKG is ordered today.  The ekg ordered today demonstrates sinus brady 52 bpm, ST-T changes consider anterolateral ischemia  Recent Labs: 02/19/2021: TSH 2.34 08/05/2021: ALT 12 09/12/2021: BUN 24; Creatinine, Ser 1.10; Hemoglobin 12.5; Platelets 120; Potassium 5.3; Sodium 142  Recent Lipid Panel    Component Value Date/Time   CHOL 149 12/09/2017 1457   TRIG 57.0 12/09/2017 1457   HDL 72.30 12/09/2017 1457   CHOLHDL 2 12/09/2017 1457   VLDL 11.4 12/09/2017 1457   LDLCALC 65 12/09/2017 1457     Risk  Assessment/Calculations:           Physical Exam:    VS:  BP 132/70    Pulse (!) 52    Ht 5\' 6"  (1.676 m)    Wt 145 lb 3.2 oz (65.9 kg)    SpO2 95%    BMI 23.44 kg/m     Wt Readings from Last 3 Encounters:  09/12/21 145 lb 3.2 oz (65.9 kg)  09/09/21 145 lb (65.8 kg)  08/05/21 154 lb (69.9 kg)     GEN:  Well nourished, well developed pleasant elderly male in no acute distress HEENT: Normal NECK: No JVD; No carotid bruits LYMPHATICS: No lymphadenopathy CARDIAC: RRR, 3/6 harsh late peaking systolic murmur at the RUSB RESPIRATORY:  Clear to auscultation without rales, wheezing or rhonchi  ABDOMEN: Soft, non-tender, non-distended MUSCULOSKELETAL:  No edema; No deformity  SKIN: Warm and dry NEUROLOGIC:  Alert and oriented x 3 PSYCHIATRIC:  Normal affect   ASSESSMENT:    1. Nonrheumatic aortic valve stenosis    PLAN:    In order of problems listed above:  This is a 87 year old gentleman with probable severe, symptomatic aortic stenosis.  By echo assessment, the aortic valve appears severely restricted and calcified.  The maximum transaortic velocity is 3.4 m/s with peak and mean gradients of 45 and 25 mmHg, respectively.  There is mild to moderate aortic insufficiency as well.  The patient's valve area calculations are probably inaccurate because of the presence of subvalvular obstruction with severe LVH.  The patient may have a combination of senile hypertrophic cardiomyopathy and severe aortic stenosis.  He also male has severe pulmonary hypertension based on his echo study with a peak pulmonary artery pressure greater than 90 mmHg.  This may result from elevated left-sided heart pressures in the setting of severe aortic stenosis and diastolic dysfunction, but also could be related to obstructive sleep apnea as this patient has a known history of sleep apnea.  The patient's symptoms are highly  suggestive of progressive aortic stenosis as he has developed New York Heart Association  functional class III symptoms of both exertional dyspnea and chest pressure.  He has become limited to very low-level activities.  It is possible that he has a combination of both coronary artery disease and aortic stenosis as etiologies of his symptoms.  I have reviewed the natural history of severe aortic stenosis with the patient today.  He understands potential treatment options include palliative medical therapy, transcatheter aortic valve replacement, and conventional surgical aortic valve replacement.  Considering the patient's advanced age, TAVR will likely be the preferred treatment modality in this gentleman.  He is eager to pursue treatment as he has become increasingly limited and he is trying to take care of his elderly wife as well.  I have recommended a right and left heart catheterization and possible PCI for further evaluation. I have reviewed the risks, indications, and alternatives to cardiac catheterization, possible angioplasty, and stenting with the patient. Risks include but are not limited to bleeding, infection, vascular injury, stroke, myocardial infection, arrhythmia, kidney injury, radiation-related injury in the case of prolonged fluoroscopy use, emergency cardiac surgery, and death. The patient understands the risks of serious complication is 1-2 in 8546 with diagnostic cardiac cath and 1-2% or less with angioplasty/stenting.  The patient will also undergo a gated cardiac CTA and a CTA of the chest, abdomen, and pelvis to evaluate anatomic suitability for TAVR.  Once his studies are completed, he will be referred for formal cardiac surgical consultation as part of a multidisciplinary heart valve team approach to his care.      Shared Decision Making/Informed Consent The risks [stroke (1 in 1000), death (1 in 1000), kidney failure [usually temporary] (1 in 500), bleeding (1 in 200), allergic reaction [possibly serious] (1 in 200)], benefits (diagnostic support and management of  coronary artery disease) and alternatives of a cardiac catheterization were discussed in detail with Mr. Schulenburg and he is willing to proceed.    Medication Adjustments/Labs and Tests Ordered: Current medicines are reviewed at length with the patient today.  Concerns regarding medicines are outlined above.  Orders Placed This Encounter  Procedures   Basic metabolic panel   CBC   EKG 12-Lead    No orders of the defined types were placed in this encounter.   Patient Instructions  Medication Instructions:  Your physician recommends that you continue on your current medications as directed. Please refer to the Current Medication list given to you today.  *If you need a refill on your cardiac medications before your next appointment, please call your pharmacy*   Lab Work: TODAY:  BMET & CBC  If you have labs (blood work) drawn today and your tests are completely normal, you will receive your results only by: [place] (if you have MyChart) OR A paper copy in the mail If you have any lab test that is abnormal or we need to change your treatment, we will call you to review the results.   Testing/Procedures: Your physician has requested that you have a cardiac catheterization. Cardiac catheterization is used to diagnose and/or treat various heart conditions. Doctors may recommend this procedure for a number of different reasons. The most common reason is to evaluate chest pain. Chest pain can be a symptom of coronary artery disease (CAD), and cardiac catheterization can show whether plaque is narrowing or blocking your hearts arteries. This procedure is also used to evaluate the valves, as well as measure the blood flow and  oxygen levels in different parts of your heart. For further information please visit [email or web address]. Please follow instruction sheet,  BELOW:   [place] OFFICE [place],  [place] [address] Dept: [phone] Loc: [place]  09/12/2021  You are scheduled for a Cardiac Catheterization on Wednesday, December 28 with Dr. Sherren Mocha.  1. Please arrive at the [facility] (Main Entrance A) at [facility]: [address] [place], [place] [address] at 9:30 AM (This time is two hours before your procedure to ensure your preparation). Free valet parking service is available.   Special note: Every effort is made to have your procedure done on time. Please understand that emergencies sometimes delay scheduled procedures.  2. Diet: Do not eat solid foods after midnight.  The patient may have clear liquids until 5am upon the day of the procedure.  3. Labs:  TODAY  4. Medication instructions in preparation for your procedure:   Contrast Allergy: No  On the morning of your procedure, take your Aspirin and any morning medicines NOT listed above.  You may use sips of water.  5. Plan for one night stay--bring personal belongings. 6. Bring a current list of your medications and current insurance cards. 7. You MUST have a responsible person to drive you home. 8. Someone MUST be with you the first 24 hours after you arrive home or your discharge will be delayed. 9. Please wear clothes that are easy to get on and off and wear slip-on shoes.  Thank you for allowing [name] to care for you!   -- [facility]     Follow-Up: At [facility], you and your health needs are our priority.  As part of our continuing mission to provide you with exceptional heart care, we have created designated Provider Care Teams.  These Care Teams include your primary Cardiologist (physician) and Advanced Practice Providers (APPs -  Physician Assistants and Nurse Practitioners) who all work together to provide you with the care you need, when you need it.  We recommend signing up for the patient portal called "MyChart".   Sign up information is provided on this After Visit Summary.  MyChart is used to connect with patients for Virtual Visits (Telemedicine).  Patients are able to view lab/test results, encounter notes, upcoming appointments, etc.  Non-urgent messages can be sent to your provider as well.   To learn more about what you can do with MyChart, go to [email or web address].    Your next appointment:   They will call you   The format for your next appointment:     Provider:       Other Instructions    Signed, Sherren Mocha, MD  09/15/2021 4:25 PM    [facility]

## 2021-09-12 NOTE — Progress Notes (Signed)
Pre Surgical Assessment: 5 M Walk Test  27M=16.38ft  5 Meter Walk Test- trial 1: 9.73 seconds 5 Meter Walk Test- trial 2: 7.62 seconds 5 Meter Walk Test- trial 3: 8.80 seconds 5 Meter Walk Test Average: 8.72 seconds

## 2021-09-17 ENCOUNTER — Telehealth: Payer: Self-pay | Admitting: Cardiovascular Disease

## 2021-09-17 NOTE — Telephone Encounter (Signed)
Pt is scheduled for surgery on 10/02/21, pt's daughter Undra Harriman is neurologist in [place], [place] would like to speak with someone regarding pt's upcoming procedure.   Pt's daughter [name] can be reached at [phone] Pt can be reached at [phone]

## 2021-09-17 NOTE — Telephone Encounter (Signed)
Spoke with the patient who gave me permission to speak with his daughter.  Spoke with the daughter who would like to know what procedure the patient is having done on 12/28. I have advised her that he is having a heart catheterization on 12/28. She states that the patient thought that he was having his aortic valve replaced on 12/28, so she was very confused. I explained to her the process of setting patient up for a TAVR. She verbalized understanding and asked that I call the patient to clarify with him so that he know he will not be having his valve replaced 12/28 and that he does not need to be hospitalized for several days after.  Called patient back and clarified the heart cath for 12/28 and that the valve replacement would take place at a later time. He verbalized understanding.

## 2021-10-01 ENCOUNTER — Telehealth: Payer: Self-pay

## 2021-10-01 ENCOUNTER — Telehealth: Payer: Self-pay | Admitting: Cardiovascular Disease

## 2021-10-01 NOTE — Telephone Encounter (Signed)
Pt wants to know if he will be staying the night at the hospital following his Cath on 10/02/21

## 2021-10-01 NOTE — Telephone Encounter (Signed)
Returned call to Pt.  Advised every effort would be made to discharge him same day, but he could have to spend the night.    He is aware.

## 2021-10-01 NOTE — Telephone Encounter (Signed)
Called patient and reviewed the following pre-cath instructions. Paient has no questions or concerns at this time.  Cardiac catheterization scheduled at [facility] for: [facility] Main Entrance A [facility]) [phone]  Diet-no solid food after midnight prior to cath, clear liquids until 5 AM day of procedure.  Medication instructions for procedure:  -Usual morning medications can be taken pre-cath with sips of water including aspirin 81 mg.  -Patient is instructed to hold lasix the morning of the procedure.    Confirmed patient has responsible adult to drive home post procedure and be with patient first 24 hours after arriving home.  [facility] does allow one visitor to accompany you and wait in the hospital waiting room while you are there for your procedure.  You and your visitor will be asked to wear a mask once you enter the hospital.  Patient reports does not currently have any new symptoms concerning for COVID-19 and no household members with COVID-19 like illness.

## 2021-10-02 ENCOUNTER — Other Ambulatory Visit: Payer: Self-pay

## 2021-10-02 ENCOUNTER — Encounter (HOSPITAL_COMMUNITY)
Admission: RE | Disposition: A | Discharge status: Home / Self Care | Payer: Self-pay | Source: Home / Self Care | Attending: Cardiovascular Disease

## 2021-10-02 ENCOUNTER — Ambulatory Visit (HOSPITAL_COMMUNITY)
Admission: RE | Admit: 2021-10-02 | Discharge: 2021-10-02 | Disposition: A | Discharge status: Home / Self Care | Payer: Medicare Other | Attending: Cardiovascular Disease | Admitting: Cardiovascular Disease

## 2021-10-02 DIAGNOSIS — I10 Essential (primary) hypertension: Secondary | ICD-10-CM | POA: Insufficient documentation

## 2021-10-02 DIAGNOSIS — I350 Nonrheumatic aortic (valve) stenosis: Secondary | ICD-10-CM

## 2021-10-02 DIAGNOSIS — G629 Polyneuropathy, unspecified: Secondary | ICD-10-CM | POA: Insufficient documentation

## 2021-10-02 DIAGNOSIS — Z955 Presence of coronary angioplasty implant and graft: Secondary | ICD-10-CM | POA: Diagnosis not present

## 2021-10-02 DIAGNOSIS — I080 Rheumatic disorders of both mitral and aortic valves: Secondary | ICD-10-CM | POA: Insufficient documentation

## 2021-10-02 DIAGNOSIS — G4733 Obstructive sleep apnea (adult) (pediatric): Secondary | ICD-10-CM | POA: Insufficient documentation

## 2021-10-02 DIAGNOSIS — I272 Pulmonary hypertension, unspecified: Secondary | ICD-10-CM | POA: Diagnosis not present

## 2021-10-02 DIAGNOSIS — E785 Hyperlipidemia, unspecified: Secondary | ICD-10-CM | POA: Diagnosis not present

## 2021-10-02 DIAGNOSIS — R0602 Shortness of breath: Secondary | ICD-10-CM | POA: Diagnosis not present

## 2021-10-02 DIAGNOSIS — I2510 Atherosclerotic heart disease of native coronary artery without angina pectoris: Secondary | ICD-10-CM

## 2021-10-02 DIAGNOSIS — I25118 Atherosclerotic heart disease of native coronary artery with other forms of angina pectoris: Secondary | ICD-10-CM | POA: Insufficient documentation

## 2021-10-02 HISTORY — PX: RIGHT/LEFT HEART CATH AND CORONARY ANGIOGRAPHY: CATH118266

## 2021-10-02 LAB — POCT I-STAT EG7
Acid-Base Excess: 0 mmol/L (ref 0.0–2.0)
Bicarbonate: 25.7 mmol/L (ref 20.0–28.0)
Calcium, Ion: 1.26 mmol/L (ref 1.15–1.40)
HCT: 34 % — ABNORMAL LOW (ref 39.0–52.0)
Hemoglobin: 11.6 g/dL — ABNORMAL LOW (ref 13.0–17.0)
O2 Saturation: 65 %
Potassium: 4.3 mmol/L (ref 3.5–5.1)
Sodium: 142 mmol/L (ref 135–145)
TCO2: 27 mmol/L (ref 22–32)
pCO2, Ven: 46.3 mmHg (ref 44.0–60.0)
pH, Ven: 7.353 (ref 7.250–7.430)
pO2, Ven: 36 mmHg (ref 32.0–45.0)

## 2021-10-02 SURGERY — RIGHT/LEFT HEART CATH AND CORONARY ANGIOGRAPHY
Anesthesia: LOCAL

## 2021-10-02 MED ORDER — HEPARIN (PORCINE) IN NACL 1000-0.9 UT/500ML-% IV SOLN
INTRAVENOUS | Status: DC | PRN
  Administered 2021-10-02 (×2): 500 mL

## 2021-10-02 MED ORDER — MIDAZOLAM HCL 2 MG/2ML IJ SOLN
INTRAMUSCULAR | Status: AC
  Filled 2021-10-02: qty 2

## 2021-10-02 MED ORDER — MIDAZOLAM HCL 2 MG/2ML IJ SOLN
INTRAMUSCULAR | Status: DC | PRN
  Administered 2021-10-02: 1 mg via INTRAVENOUS

## 2021-10-02 MED ORDER — SODIUM CHLORIDE 0.9% FLUSH: 3.0000 mL | INTRAVENOUS | Status: DC | PRN

## 2021-10-02 MED ORDER — VERAPAMIL HCL 2.5 MG/ML IV SOLN
INTRAVENOUS | Status: DC | PRN
  Administered 2021-10-02: 12:00:00 10 mL via INTRA_ARTERIAL

## 2021-10-02 MED ORDER — SODIUM CHLORIDE 0.9% FLUSH: 3.0000 mL | Freq: Two times a day (BID) | INTRAVENOUS | Status: DC

## 2021-10-02 MED ORDER — SODIUM CHLORIDE 0.9 % IV SOLN: 250.0000 mL | INTRAVENOUS | Status: DC | PRN

## 2021-10-02 MED ORDER — FENTANYL CITRATE (PF) 100 MCG/2ML IJ SOLN
INTRAMUSCULAR | Status: AC
  Filled 2021-10-02: qty 2

## 2021-10-02 MED ORDER — HYDRALAZINE HCL 20 MG/ML IJ SOLN: 10.0000 mg | INTRAMUSCULAR | Status: DC | PRN

## 2021-10-02 MED ORDER — ONDANSETRON HCL 4 MG/2ML IJ SOLN: 4.0000 mg | Freq: Four times a day (QID) | INTRAMUSCULAR | Status: DC | PRN

## 2021-10-02 MED ORDER — FENTANYL CITRATE (PF) 100 MCG/2ML IJ SOLN
INTRAMUSCULAR | Status: DC | PRN
  Administered 2021-10-02: 25 ug via INTRAVENOUS

## 2021-10-02 MED ORDER — ASPIRIN 81 MG PO CHEW
81.0000 mg | CHEWABLE_TABLET | ORAL | Status: AC
  Administered 2021-10-02: 10:00:00 81 mg via ORAL
  Filled 2021-10-02: qty 1

## 2021-10-02 MED ORDER — HEPARIN SODIUM (PORCINE) 1000 UNIT/ML IJ SOLN
INTRAMUSCULAR | Status: DC | PRN
  Administered 2021-10-02: 3500 [IU] via INTRAVENOUS

## 2021-10-02 MED ORDER — SODIUM CHLORIDE 0.9 % WEIGHT BASED INFUSION: 1.0000 mL/kg/h | INTRAVENOUS | Status: DC

## 2021-10-02 MED ORDER — LIDOCAINE HCL (PF) 1 % IJ SOLN
INTRAMUSCULAR | Status: AC
  Filled 2021-10-02: qty 30

## 2021-10-02 MED ORDER — HEPARIN SODIUM (PORCINE) 1000 UNIT/ML IJ SOLN
INTRAMUSCULAR | Status: AC
  Filled 2021-10-02: qty 10

## 2021-10-02 MED ORDER — LABETALOL HCL 5 MG/ML IV SOLN
10.0000 mg | INTRAVENOUS | Status: DC | PRN
  Administered 2021-10-02: 14:00:00 10 mg via INTRAVENOUS
  Filled 2021-10-02: qty 4

## 2021-10-02 MED ORDER — VERAPAMIL HCL 2.5 MG/ML IV SOLN
INTRAVENOUS | Status: AC
  Filled 2021-10-02: qty 2

## 2021-10-02 MED ORDER — SODIUM CHLORIDE 0.9 % WEIGHT BASED INFUSION
3.0000 mL/kg/h | INTRAVENOUS | Status: AC
  Administered 2021-10-02: 10:00:00 3 mL/kg/h via INTRAVENOUS

## 2021-10-02 MED ORDER — LIDOCAINE HCL (PF) 1 % IJ SOLN
INTRAMUSCULAR | Status: DC | PRN
  Administered 2021-10-02 (×3): 2 mL via INTRADERMAL

## 2021-10-02 MED ORDER — IOHEXOL 350 MG/ML SOLN
INTRAVENOUS | Status: DC | PRN
  Administered 2021-10-02: 12:00:00 50 mL

## 2021-10-02 MED ORDER — ACETAMINOPHEN 325 MG PO TABS: 650.0000 mg | ORAL_TABLET | ORAL | Status: DC | PRN

## 2021-10-02 MED ORDER — SODIUM CHLORIDE 0.9 % IV SOLN: INTRAVENOUS | Status: DC

## 2021-10-02 MED ORDER — HEPARIN (PORCINE) IN NACL 1000-0.9 UT/500ML-% IV SOLN
INTRAVENOUS | Status: AC
  Filled 2021-10-02: qty 1000

## 2021-10-02 SURGICAL SUPPLY — 19 items
CATH BALLN WEDGE 5F 110CM (CATHETERS) ×2 IMPLANT
CATH INFINITI 5 FR AL2 (CATHETERS) ×2 IMPLANT
CATH INFINITI 5 FR JL3.5 (CATHETERS) ×2 IMPLANT
CATH INFINITI 5FR AL1 (CATHETERS) ×2 IMPLANT
CATH INFINITI 5FR JL4 (CATHETERS) ×2 IMPLANT
CATH INFINITI JR4 5F (CATHETERS) ×2 IMPLANT
DEVICE RAD COMP TR BAND LRG (VASCULAR PRODUCTS) ×2 IMPLANT
GLIDESHEATH SLEND SS 6F .021 (SHEATH) ×2 IMPLANT
GUIDEWIRE .025 260CM (WIRE) ×2 IMPLANT
GUIDEWIRE INQWIRE 1.5J.035X260 (WIRE) IMPLANT
INQWIRE 1.5J .035X260CM (WIRE) ×6
KIT HEART LEFT (KITS) ×3 IMPLANT
PACK CARDIAC CATHETERIZATION (CUSTOM PROCEDURE TRAY) ×3 IMPLANT
SHEATH GLIDE SLENDER 4/5FR (SHEATH) ×2 IMPLANT
SHEATH PROBE COVER 6X72 (BAG) ×2 IMPLANT
TRANSDUCER W/STOPCOCK (MISCELLANEOUS) ×3 IMPLANT
TUBING CIL FLEX 10 FLL-RA (TUBING) ×3 IMPLANT
WIRE EMERALD ST .035X260CM (WIRE) ×2 IMPLANT
WIRE HI TORQ VERSACORE-J 145CM (WIRE) ×2 IMPLANT

## 2021-10-02 NOTE — Interval H&P Note (Signed)
Cath Lab Visit (complete for each Cath Lab visit)  Clinical Evaluation Leading to the Procedure:   ACS: No.  Non-ACS:    Anginal Classification: CCS III  Anti-ischemic medical therapy: Maximal Therapy (2 or more classes of medications)  Non-Invasive Test Results: No non-invasive testing performed  Prior CABG: No previous CABG      History and Physical Interval Note:  10/02/2021 10:59 AM  Paul Mays  has presented today for surgery, with the diagnosis of aortic stenosis.  The various methods of treatment have been discussed with the patient and family. After consideration of risks, benefits and other options for treatment, the patient has consented to  Procedure(s): RIGHT/LEFT HEART CATH AND CORONARY ANGIOGRAPHY (N/A) as a surgical intervention.  The patient's history has been reviewed, patient examined, no change in status, stable for surgery.  I have reviewed the patient's chart and labs.  Questions were answered to the patient's satisfaction.     Sherren Mocha

## 2021-10-03 ENCOUNTER — Encounter (HOSPITAL_COMMUNITY): Payer: Self-pay | Admitting: Cardiovascular Disease

## 2021-10-08 ENCOUNTER — Other Ambulatory Visit: Payer: Self-pay

## 2021-10-08 DIAGNOSIS — I350 Nonrheumatic aortic (valve) stenosis: Secondary | ICD-10-CM

## 2021-10-10 ENCOUNTER — Other Ambulatory Visit: Payer: Self-pay

## 2021-10-10 ENCOUNTER — Other Ambulatory Visit: Payer: Medicare Other | Admitting: *Deleted

## 2021-10-10 DIAGNOSIS — I350 Nonrheumatic aortic (valve) stenosis: Secondary | ICD-10-CM | POA: Diagnosis not present

## 2021-10-10 LAB — BASIC METABOLIC PANEL
BUN/Creatinine Ratio: 16 (ref 10–24)
BUN: 20 mg/dL (ref 10–36)
CO2: 25 mmol/L (ref 20–29)
Calcium: 9.6 mg/dL (ref 8.6–10.2)
Chloride: 102 mmol/L (ref 96–106)
Creatinine, Ser: 1.22 mg/dL (ref 0.76–1.27)
Glucose: 84 mg/dL (ref 70–99)
Potassium: 4.5 mmol/L (ref 3.5–5.2)
Sodium: 141 mmol/L (ref 134–144)
eGFR: 56 mL/min/{1.73_m2} — ABNORMAL LOW (ref >59)

## 2021-10-16 ENCOUNTER — Other Ambulatory Visit: Payer: Self-pay | Admitting: Internal Medicine

## 2021-10-17 ENCOUNTER — Other Ambulatory Visit: Payer: Self-pay

## 2021-10-17 ENCOUNTER — Ambulatory Visit (HOSPITAL_COMMUNITY)
Admission: RE | Admit: 2021-10-17 | Discharge: 2021-10-17 | Disposition: A | Discharge status: Home / Self Care | Payer: Medicare Other | Source: Ambulatory Visit | Attending: Cardiovascular Disease | Admitting: Cardiovascular Disease

## 2021-10-17 DIAGNOSIS — I350 Nonrheumatic aortic (valve) stenosis: Secondary | ICD-10-CM | POA: Diagnosis not present

## 2021-10-17 DIAGNOSIS — K8020 Calculus of gallbladder without cholecystitis without obstruction: Secondary | ICD-10-CM | POA: Diagnosis not present

## 2021-10-17 DIAGNOSIS — J9811 Atelectasis: Secondary | ICD-10-CM | POA: Diagnosis not present

## 2021-10-17 DIAGNOSIS — R188 Other ascites: Secondary | ICD-10-CM | POA: Diagnosis not present

## 2021-10-17 DIAGNOSIS — J90 Pleural effusion, not elsewhere classified: Secondary | ICD-10-CM | POA: Diagnosis not present

## 2021-10-17 DIAGNOSIS — K5730 Diverticulosis of large intestine without perforation or abscess without bleeding: Secondary | ICD-10-CM | POA: Diagnosis not present

## 2021-10-17 MED ORDER — IOHEXOL 350 MG/ML SOLN
80.0000 mL | Freq: Once | INTRAVENOUS | Status: AC | PRN
  Administered 2021-10-17: 80 mL via INTRAVENOUS

## 2021-11-05 ENCOUNTER — Other Ambulatory Visit: Payer: Self-pay | Admitting: Physician Assistant

## 2021-11-05 ENCOUNTER — Encounter: Payer: Self-pay | Admitting: Physician Assistant

## 2021-11-05 ENCOUNTER — Telehealth: Payer: Self-pay | Admitting: Internal Medicine

## 2021-11-05 DIAGNOSIS — I350 Nonrheumatic aortic (valve) stenosis: Secondary | ICD-10-CM

## 2021-11-05 NOTE — Telephone Encounter (Signed)
Pt's son requesting a call back to discuss veterans aid and attendance application forms

## 2021-11-06 ENCOUNTER — Encounter: Payer: Self-pay | Admitting: Surgery

## 2021-11-06 ENCOUNTER — Institutional Professional Consult (permissible substitution) (INDEPENDENT_AMBULATORY_CARE_PROVIDER_SITE_OTHER): Payer: Medicare Other | Admitting: Surgery

## 2021-11-06 ENCOUNTER — Other Ambulatory Visit: Payer: Self-pay

## 2021-11-06 VITALS — BP 142/69 | HR 87 | Resp 20 | Ht 66.0 in | Wt 146.0 lb

## 2021-11-06 DIAGNOSIS — I350 Nonrheumatic aortic (valve) stenosis: Secondary | ICD-10-CM

## 2021-11-06 NOTE — Telephone Encounter (Signed)
Pt's son checking status of c/b  Please call [phone]  *see below*

## 2021-11-06 NOTE — Telephone Encounter (Signed)
Spoke with son today

## 2021-11-06 NOTE — Progress Notes (Signed)
Patient ID: Paul Mays, male   DOB: [birth year], 88 y.o.   MRN: [id number]  [facility]VE CLINIC         [address]       [place] [address]             [phone]          CARDIOTHORACIC SURGERY CONSULTATION REPORT  PCP is Quay Burow, Claudina Lick, MD Referring Provider is Sherren Mocha, MD Primary Cardiologist is Sherren Mocha, MD  Reason for consultation:  Severe aortic stenosis  HPI:  The patient is a 88 year old gentleman with history of hypertension, hyperlipidemia, coronary artery disease status post PCI of the RCA and left circumflex in 1999, sleep apnea, LVH with septal hypertrophy and LV outflow tract obstruction who presents with a 33-month history of progressive exertional fatigue and shortness of breath.  He says that he has been eating well but has lost considerable weight over that period of time.  He has had some dizziness but no syncope.  He has had lower extremity edema.  He denies any chest pain or pressure.  An echocardiogram on 09/05/2021 showed a severely calcified and restricted aortic valve with a mean gradient of 25 mmHg.  There is moderate aortic insufficiency with a pressure half-time of 621 ms.  Left ventricular ejection fraction was 50 to 55% with severe asymmetric left ventricular hypertrophy of the basal septal segment with grade 3 diastolic dysfunction.  Right ventricular size is moderately enlarged with moderate reduction in systolic function.  PA pressure was severely elevated.  The mitral valve was degenerative in appearance with moderate mitral regurgitation.  Cardiac catheterization showed severe single-vessel disease with chronic occlusion of the RCA collateralized from the left.  There is severe pulmonary hypertension with PA pressure of 78/29 and a mean of 48 mmHg.  Pulmonary vascular resistance was 4.6 Wood units.  The aortic valve was difficult to cross but had a low mean gradient of 8 mmHg.   LVEDP was 25.  The patient is here today with his son.  He lives independently and is still active.  Past Medical History:  Diagnosis Date   Anxiety    Aortic stenosis    Arthralgia    Arthritis    CAD (coronary artery disease)    Carotid stenosis    Cervicalgia    CLUSTER HEADACHE 02/10/2007   Qualifier: Diagnosis of  By: Linna Darner MD, Gwyndolyn Saxon     Colitis    Edema    Gout    Hearing loss    Herpes zoster without mention of complication    Hyperlipidemia    Hypertension    Hyperuricemia    Impacted cerumen    Migraine    Migraine    Murmur    Olecranon bursitis    OSA (obstructive sleep apnea)    Other and unspecified hyperlipidemia    Other malaise and fatigue    Peripheral neuropathy    Polyneuropathy    Pulmonary hypertension ([place])    Unspecified hereditary and idiopathic peripheral neuropathy    Venous insufficiency     Past Surgical History:  Procedure Laterality Date   ANGIOPLASTY     w/ 2 stents in [place]     RIGHT/LEFT HEART CATH AND CORONARY ANGIOGRAPHY N/A 10/02/2021   Procedure: RIGHT/LEFT HEART CATH AND CORONARY ANGIOGRAPHY;  Surgeon: Sherren Mocha, MD;  Location: [facility]  INVASIVE CV LAB;  Service: Cardiovascular;  Laterality: N/A;   TONSILLECTOMY      Family History  Problem Relation Age of Onset   Stroke Father        Died, 83   High blood pressure Father    Diabetes Father    Other Mother        Died, 36   Diabetes Brother        Died, 82   Diabetes Sister        Living, 82   Healthy Daughter    Healthy Son     Social History   Socioeconomic History   Marital status: Married    Spouse name: [name]   Number of children: 2   Years of education: college   Highest education level: Not on file  Occupational History   Occupation: retired    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 Patient finished  two years of college.    Pt has children.   Caffeine -two cups daily.   Left handed.   Retired Licensed conveyancer                    Social Determinants of Radio broadcast assistant Strain: Low Risk    Difficulty of Paying Living Expenses: Not hard at all  Food Insecurity: No Food Insecurity   Worried About Charity fundraiser in the Last Year: Never true   Arboriculturist in the Last Year: Never true  Transportation Needs: No Transportation Needs   Lack of Transportation (Medical): No   Lack of Transportation (Non-Medical): No  Physical Activity: Sufficiently Active   Days of Exercise per Week: 5 days   Minutes of Exercise per Session: 30 min  Stress: No Stress Concern Present   Feeling of Stress : Not at all  Social Connections: Socially Integrated   Frequency of Communication with Friends and Family: More than three times a week   Frequency of Social Gatherings with Friends and Family: More than three times a week   Attends Religious Services: More than 4 times per year   Active Member of [employer] or Organizations: Yes   Attends Music therapist: More than 4 times per year   Marital Status: Married  Human resources officer Violence: Not on file    Prior to Admission medications   Medication Sig Start Date End Date Taking? Authorizing Provider  allopurinol (ZYLOPRIM) 300 MG tablet TAKE 1 TABLET(300 MG) BY MOUTH DAILY AS NEEDED FOR GOUT FLARE 08/06/21  Yes Burns, Claudina Lick, MD  aspirin EC 81 MG tablet Take 1 tablet (81 mg total) by mouth daily. 08/29/19  Yes Sande Rives E, PA-C  Cholecalciferol (VITAMIN D3) 50 MCG (2000 UT) capsule Take 2,000 Units  by mouth daily.   Yes [provider]  furosemide (LASIX) 20 MG tablet TAKE 1 TABLET(20 MG) BY MOUTH DAILY 10/16/21  Yes Burns, Claudina Lick, MD  gabapentin (NEURONTIN) 600 MG tablet Take 1 tablet (  600 mg total) by mouth 2 (two) times daily. 05/16/21  Yes Burns, Claudina Lick, MD  metoprolol tartrate (LOPRESSOR) 25 MG tablet Take 1 tablet (25 mg total) by mouth 2 (two) times daily. 03/08/21  Yes Burns, Claudina Lick, MD  pravastatin (PRAVACHOL) 40 MG tablet Take 40 mg by mouth daily.   Yes [provider]  vitamin B-12 (CYANOCOBALAMIN) 500 MCG tablet Take 500 mcg by mouth daily. Patient not taking: Reported on 11/06/2021    [provider]    Current Outpatient Medications  Medication Sig Dispense Refill   allopurinol (ZYLOPRIM) 300 MG tablet TAKE 1 TABLET(300 MG) BY MOUTH DAILY AS NEEDED FOR GOUT FLARE 90 tablet 0   aspirin EC 81 MG tablet Take 1 tablet (81 mg total) by mouth daily.     Cholecalciferol (VITAMIN D3) 50 MCG (2000 UT) capsule Take 2,000 Units by mouth daily.     furosemide (LASIX) 20 MG tablet TAKE 1 TABLET(20 MG) BY MOUTH DAILY 90 tablet 1   gabapentin (NEURONTIN) 600 MG tablet Take 1 tablet (600 mg total) by mouth 2 (two) times daily. 180 tablet 1   metoprolol tartrate (LOPRESSOR) 25 MG tablet Take 1 tablet (25 mg total) by mouth 2 (two) times daily. 180 tablet 3   pravastatin (PRAVACHOL) 40 MG tablet Take 40 mg by mouth daily.     vitamin B-12 (CYANOCOBALAMIN) 500 MCG tablet Take 500 mcg by mouth daily. (Patient not taking: Reported on 11/06/2021)     No current facility-administered medications for this visit.    Allergies  Allergen Reactions   Lisinopril Other (See Comments)    Hyperkalemia in the context of combined spironolactone and lisinopril therapy   Spironolactone Other (See Comments)    Hyperkalemia in context of spironolactone and lisinopril therapy      Review of Systems:   General:  normal appetite, + decreased energy, no weight gain, + weight loss,  no fever  Cardiac:  no chest pain with exertion, no chest pain at rest, +SOB with mild exertion, no resting SOB, no PND, no orthopnea, no palpitations, no arrhythmia, no atrial fibrillation, + LE edema, + dizzy spells, no syncope  Respiratory:  + exertional shortness of breath, no home oxygen, no productive cough, no dry cough, no bronchitis, no wheezing, no hemoptysis, no asthma, no pain with inspiration or cough, no sleep apnea, no CPAP at night  GI:   no difficulty swallowing, no reflux, no frequent heartburn, no hiatal hernia, no abdominal pain, no constipation, no diarrhea, no hematochezia, no hematemesis, no melena  GU:   no dysuria,  + frequency, no urinary tract infection, no hematuria, no enlarged prostate, no kidney stones, no kidney disease  Vascular:  no pain suggestive of claudication, no pain in feet, no leg cramps, no varicose veins, no DVT, no non-healing foot ulcer  Neuro:   no stroke, no TIA's, no seizures, no headaches, no temporary blindness one eye,  no slurred speech, + peripheral neuropathy, no chronic pain, no instability of gait, + memory/cognitive dysfunction  Musculoskeletal: + arthritis, + joint swelling, + myalgias, no difficulty walking, normal mobility   Skin:   no rash, no itching, no skin infections, no pressure sores or ulcerations  Psych:   no anxiety, no depression, no nervousness, no unusual recent stress  Eyes:   no blurry vision, no floaters, no recent vision changes, no glasses or contacts  ENT:   no hearing loss, + loose or painful teeth, no dentures, last saw dentist 07/2021  Hematologic:  +  easy bruising, no abnormal bleeding, no clotting disorder, no frequent epistaxis  Endocrine:  no diabetes, does not check CBG's at home     Physical Exam:   BP (!) 142/69 (BP Location: Left Arm, Patient Position: Sitting)    Pulse 87    Resp 20    Ht 5\' 6"  (1.676 m)    Wt 146 lb (66.2 kg)    SpO2 92% Comment: RA   BMI 23.57 kg/m   General:  Elderly but   well-appearing  HEENT:  Unremarkable, NCAT, PERLA, EOMI  Neck:   no JVD, no bruits, no adenopathy   Chest:   clear to auscultation, symmetrical breath sounds, no wheezes, no rhonchi   CV:   RRR, 3/6 systolic murmur RSB.  Abdomen:  soft, non-tender, no masses   Extremities:  warm, well-perfused, pulses palpable at ankle, mild lower extremity edema  Rectal/GU  Deferred  Neuro:   Grossly non-focal and symmetrical throughout  Skin:   Clean and dry, no rashes, no breakdown  Diagnostic Tests:  ECHOCARDIOGRAM REPORT         Patient Name:   [name] Date of Exam: 09/05/2021  Medical Rec #:  [id number]      Height:       66.0 in  Accession #:    [id number]     Weight:       154.0 lb  Date of Birth:  1928-06-24       BSA:          1.790 m  Patient Age:    86 years       BP:           140/80 mmHg  Patient Gender: M              HR:           52 bpm.  Exam Location:  [place]   Procedure: 2D Echo, 3D Echo, Cardiac Doppler, Color Doppler and Strain  Analysis   Indications:    I42.1 Hypertrophic Cardiomyopathy     History:        Patient has prior history of Echocardiogram examinations,  most                  recent 09/13/2019. Hypertrophic Cardiomyopathy, CAD,  Pulmonary                  HTN, Signs/Symptoms:Dyspnea, Edema and Shortness of  Breath; Risk                  Factors:Hypertension, Dyslipidemia and Sleep Apnea.     Sonographer:    Deliah Boston RDCS  Referring Phys: Claudina Lick BURNS   IMPRESSIONS     1. The aortic valve is severely calcified with restricted movement in  systole. Visually, there is concern for severe aortic stenosis. Vmax 3.4  m/s, MG 25 mmHG. The AVA and DI are overestimated in the setting of  moderate aortic regurgitation. Would  recommend TEE for clarification of AS/AI severity. The aortic valve is  tricuspid. There is severe calcifcation of the aortic valve. There is  severe thickening of the aortic valve. Aortic valve regurgitation is   moderate.   2. There is severe asymmetric hypertrophy of the basal septum consistent  with prior history of hypertrophic cardiomyopathy. No signs of mitral  valve SAM or outflow tract obstruction. Left ventricular ejection  fraction, by estimation, is 50 to 55%. Left  ventricular ejection fraction by 3D volume is 52 %.  The left ventricle has  low normal function. The left ventricle has no regional wall motion  abnormalities. There is severe asymmetric left ventricular hypertrophy of  the basal-septal segment. Left  ventricular diastolic parameters are consistent with Grade III diastolic  dysfunction (restrictive). Elevated left ventricular end-diastolic  pressure. The E/e' is 23.7.   3. Right ventricular systolic function is moderately reduced. The right  ventricular size is moderately enlarged. There is severely elevated  pulmonary artery systolic pressure. The estimated right ventricular  systolic pressure is 01.0 mmHg.   4. Left atrial size was severely dilated.   5. Right atrial size was severely dilated.   6. The mitral valve is degenerative. Moderate mitral valve regurgitation.   7. The inferior vena cava is dilated in size with <50% respiratory  variability, suggesting right atrial pressure of 15 mmHg.   Comparison(s): Changes from prior study are noted. Concerns for severe AS  as detailed above.   FINDINGS   Left Ventricle: There is severe asymmetric hypertrophy of the basal  septum consistent with prior history of hypertrophic cardiomyopathy. No  signs of mitral valve SAM or outflow tract obstruction. Left ventricular  ejection fraction, by estimation, is 50  to 55%. Left ventricular ejection fraction by 3D volume is 52 %. The left  ventricle has low normal function. The left ventricle has no regional wall  motion abnormalities. Global longitudinal strain performed but not  reported based on interpreter  judgement due to suboptimal tracking. The left ventricular internal  cavity  size was normal in size. There is severe asymmetric left ventricular  hypertrophy of the basal-septal segment. Left ventricular diastolic  parameters are consistent with Grade III  diastolic dysfunction (restrictive). Elevated left ventricular  end-diastolic pressure. The E/e' is 23.7.   Right Ventricle: The right ventricular size is moderately enlarged. No  increase in right ventricular wall thickness. Right ventricular systolic  function is moderately reduced. There is severely elevated pulmonary  artery systolic pressure. The tricuspid  regurgitant velocity is 4.21 m/s, and with an assumed right atrial  pressure of 15 mmHg, the estimated right ventricular systolic pressure is  93.2 mmHg.   Left Atrium: Left atrial size was severely dilated.   Right Atrium: Right atrial size was severely dilated.   Pericardium: Trivial pericardial effusion is present.   Mitral Valve: The mitral valve is degenerative in appearance. Moderate  mitral valve regurgitation.   Tricuspid Valve: The tricuspid valve is grossly normal. Tricuspid valve  regurgitation is mild . No evidence of tricuspid stenosis.   Aortic Valve: The aortic valve is severely calcified with restricted  movement in systole. Visually, there is concern for severe aortic  stenosis. Vmax 3.4 m/s, MG 25 mmHG. The AVA and DI are overestimated in  the setting of moderate aortic regurgitation.  Would recommend TEE for clarification of AS/AI severity. The aortic valve  is tricuspid. There is severe calcifcation of the aortic valve. There is  severe thickening of the aortic valve. Aortic valve regurgitation is  moderate. Aortic regurgitation PHT  measures 621 msec. Aortic valve mean gradient measures 25.0 mmHg. Aortic  valve peak gradient measures 45.2 mmHg. Aortic valve area, by VTI measures  1.31 cm.   Pulmonic Valve: The pulmonic valve was grossly normal. Pulmonic valve  regurgitation is mild. No evidence of pulmonic  stenosis.   Aorta: The aortic root and ascending aorta are structurally normal, with  no evidence of dilitation.   Venous: The inferior vena cava is dilated in size with less than  50%  respiratory variability, suggesting right atrial pressure of 15 mmHg.   IAS/Shunts: The atrial septum is grossly normal.      LEFT VENTRICLE  PLAX 2D  LVIDd:         4.80 cm         Diastology  LVIDs:         2.95 cm         LV e' medial:    5.03 cm/s  LV PW:         1.15 cm         LV E/e' medial:  23.5  LV IVS:        1.70 cm         LV e' lateral:   6.65 cm/s  LVOT diam:     2.10 cm         LV E/e' lateral: 17.8  LV SV:         110  LV SV Index:   61              2D  LVOT Area:     3.46 cm        Longitudinal                                 Strain                                 2D Strain GLS  17.9 %                                 (A2C):                                 2D Strain GLS  18.3 %                                 (A3C):                                 2D Strain GLS  16.0 %                                 (A4C):                                 2D Strain GLS  17.4 %                                 Avg:                                    3D Volume EF                                 LV 3D EF:    Left  ventricul                                              ar                                              ejection                                              fraction                                              by 3D                                              volume is                                              52 %.                                    3D Volume EF:                                 3D EF:        52 %                                 LV EDV:       200 ml                                 LV ESV:       97 ml                                 LV SV:        103 ml   RIGHT VENTRICLE  RV S prime:     14.40 cm/s  TAPSE (M-mode):  1.8 cm   LEFT ATRIUM              Index        RIGHT ATRIUM           Index  LA diam:        5.65 cm  3.16 cm/m   RA Area:     31.80 cm  LA Vol (A2C):   128.0 ml 71.53 ml/m  RA Volume:   125.00 ml 69.85 ml/m  LA Vol (A4C):   158.0 ml 88.29 ml/m  LA Biplane Vol: 149.0 ml 83.26 ml/m   AORTIC VALVE  AV Area (Vmax):    1.38 cm  AV Area (Vmean):   1.33 cm  AV Area (VTI):     1.31 cm  AV Vmax:           336.00 cm/s  AV Vmean:          214.500 cm/s  AV VTI:            0.834 m  AV Peak Grad:      45.2 mmHg  AV Mean Grad:      25.0 mmHg  LVOT Vmax:         134.00 cm/s  LVOT Vmean:        82.125 cm/s  LVOT VTI:          0.316 m  LVOT/AV VTI ratio: 0.38  AI PHT:            621 msec     AORTA  Ao Root diam: 3.50 cm  Ao Asc diam:  3.60 cm   MITRAL VALVE                TRICUSPID VALVE  MV Area (PHT): cm          TR Peak grad:   70.9 mmHg  MV Decel Time: 161 msec     TR Vmax:        421.00 cm/s  MR Peak grad: 160.5 mmHg  MR Mean grad: 110.0 mmHg    SHUNTS  MR Vmax:      633.50 cm/s   Systemic VTI:  0.32 m  MR Vmean:     497.0 cm/s    Systemic Diam: 2.10 cm  MV E velocity: 118.33 cm/s  MV A velocity: 54.70 cm/s  MV E/A ratio:  2.16   Eleonore Chiquito MD  Electronically signed by Eleonore Chiquito MD  Signature Date/Time: 09/06/2021/10:22:52 AM         Final     Physicians  Panel Physicians Referring Physician Case Authorizing Physician  Sherren Mocha, MD (Primary)     Procedures  RIGHT/LEFT HEART CATH AND CORONARY ANGIOGRAPHY   Conclusion  1.  Severe single-vessel coronary artery disease with chronic occlusion of the RCA, collateralized from the left coronary artery. 2.  Calcified, restricted aortic valve on plain fluoroscopy, difficult to cross with a straight wire, but low mean transaortic gradient of only 8 mmHg. 3.  Severe pulmonary hypertension with pulmonary artery pressure of 78/29, mean 48 mmHg. 4.  Transpulmonary gradient 23 mmHg, PVR 4.6 Wood  units.  Recommendations: Further valve team review to consider TAVR in this patient with what appears to be severe aortic stenosis by noninvasive assessment, medical therapy for coronary artery disease, consider restrictive cardiomyopathy.   Procedural Details  Technical Details INDICATION: 88 year old functionally independent gentleman with progressive symptoms of aortic stenosis, pulmonary hypertension, and exertional angina.  He has a remote history of coronary artery disease status post PCI in the 1990s.  He presents today for right and left heart catheterization for hemodynamic assessment and assessment of coronary anatomy.  PROCEDURAL DETAILS: There was an indwelling IV in a left antecubital vein.  Attempts were made to change this out but a wire would not pass beyond the tip of the IV.  The IV was capped and attention was turned to the right antecubital fossa.  Using direct ultrasound guidance, the brachial vein is accessed via a front wall puncture and a 4/5 French slender sheath is  placed. The right wrist was then prepped, draped, and anesthetized with 1% lidocaine. Using direct ultrasound guidance a 5/6 French Slender sheath was placed in the right radial artery.  Ultrasound images are digitally captured and stored in the patient's chart. Intra-arterial verapamil was administered through the radial artery sheath. IV heparin was administered after a JR4 catheter was advanced into the central aorta. A Swan-Ganz catheter was used for the right heart catheterization.  It was difficult to pass through the brachial vein and a venogram is performed.  An angled Glidewire was then used to traverse the venous anatomy into the central circulation.  The catheter would not pass because of tortuosity and it is ultimately changed out for a JR4 catheter.  I was able to direct the JR4 catheter into the pulmonary artery and do pulmonary artery oxygen saturation and pressure recording.  Right heart pullback is then  performed.  Standard protocol was followed for recording of right heart pressures and sampling of oxygen saturations. Fick cardiac output was calculated. Standard Judkins catheters were used for selective coronary angiography. LV pressure is recorded and an aortic valve pullback is performed.  The aortic valve is difficult to cross.  It is ultimately crossed with an AL 2 catheter and a straight tip wire.  There were no immediate procedural complications. The patient was transferred to the post catheterization recovery area for further monitoring.      Estimated blood loss <50 mL.   During this procedure medications were administered to achieve and maintain moderate conscious sedation while the patient's heart rate, blood pressure, and oxygen saturation were continuously monitored and I was present face-to-face 100% of this time.   Medications (Filter: Administrations occurring from 1055 to 1244 on 10/02/21)  important  Continuous medications are totaled by the amount administered until 10/02/21 1244.   Heparin (Porcine) in NaCl 1000-0.9 UT/500ML-% SOLN (mL) Total volume:  1,000 mL Date/Time Rate/Dose/Volume Action   10/02/21 1111 500 mL Given   1111 500 mL Given    fentaNYL (SUBLIMAZE) injection (mcg) Total dose:  25 mcg Date/Time Rate/Dose/Volume Action   10/02/21 1119 25 mcg Given    midazolam (VERSED) injection (mg) Total dose:  1 mg Date/Time Rate/Dose/Volume Action   10/02/21 1119 1 mg Given    lidocaine (PF) (XYLOCAINE) 1 % injection (mL) Total volume:  6 mL Date/Time Rate/Dose/Volume Action   10/02/21 1120 2 mL Given   1124 2 mL Given   1131 2 mL Given    Radial Cocktail/Verapamil only (mL) Total volume:  10 mL Date/Time Rate/Dose/Volume Action   10/02/21 1135 10 mL Given    heparin sodium (porcine) injection (Units) Total dose:  3,500 Units Date/Time Rate/Dose/Volume Action   10/02/21 1139 3,500 Units Given    iohexol (OMNIPAQUE) 350 MG/ML injection (mL) Total  volume:  50 mL Date/Time Rate/Dose/Volume Action   10/02/21 1212 50 mL Given    Sedation Time  Sedation Time Physician-1: 52 minutes 56 seconds Contrast  Medication Name Total Dose  iohexol (OMNIPAQUE) 350 MG/ML injection 50 mL   Radiation/Fluoro  Fluoro time: 14.1 (min) DAP: 96295 (mGycm2) Cumulative Air Kerma: 284 (mGy) Complications  Complications documented before study signed (10/02/2021 13:24 PM)   No complications were associated with this study.  Documented by Leone Payor D - 10/02/2021 12:22 PM     Coronary Findings  Diagnostic Dominance: Right Left Main  There is mild diffuse disease throughout the vessel.    Left Anterior Descending  Prox LAD to Mid LAD lesion is  50% stenosed. The lesion is calcified.    Left Circumflex  There is mild diffuse disease throughout the vessel.    Right Coronary Artery  Prox RCA lesion is 100% stenosed.    Right Posterior Descending Artery  Collaterals  RPDA filled by collaterals from 1st Sept.      First Right Posterolateral Branch  Collaterals  1st RPL filled by collaterals from 2nd Sept.      Intervention   No interventions have been documented.   Right Heart  Right Heart Pressures Hemodynamic findings consistent with severe pulmonary hypertension. Elevated LV EDP consistent with volume overload.   Left Heart  Left Ventricle LV end diastolic pressure is moderately elevated.  Aortic Valve The aortic valve is calcified. There is restricted aortic valve motion.   Coronary Diagrams  Diagnostic Dominance: Right Intervention  Implants     No implant documentation for this case.   Syngo Images   Show images for CARDIAC CATHETERIZATION Images on Long Term Storage   Show images for [place], [place] to Procedure Log  Procedure Log    Hemo Data  Flowsheet Row Most Recent Value  Fick Cardiac Output 5.06 L/min  Fick Cardiac Output Index 2.9 (L/min)/BSA  RA A Wave 17 mmHg  RA V Wave 19 mmHg   RA Mean 15 mmHg  RV Systolic Pressure 78 mmHg  RV Diastolic Pressure 10 mmHg  RV EDP 15 mmHg  PA Systolic Pressure 78 mmHg  PA Diastolic Pressure 29 mmHg  PA Mean 48 mmHg  AO Systolic Pressure 010 mmHg  AO Diastolic Pressure 68 mmHg  AO Mean 932 mmHg  LV Systolic Pressure 355 mmHg  LV Diastolic Pressure 19 mmHg  LV EDP 25 mmHg  AOp Systolic Pressure 732 mmHg  AOp Diastolic Pressure 70 mmHg  AOp Mean Pressure 202 mmHg  LVp Systolic Pressure 542 mmHg  LVp Diastolic Pressure 15 mmHg  LVp EDP Pressure 24 mmHg  QP/QS 1  TPVR Index 16.57 HRUI  TSVR Index 36.6 HRUI  TPVR/TSVR Ratio 0.45    ADDENDUM REPORT: 10/17/2021 14:39   CLINICAL DATA:  Severe Aortic Stenosis.   EXAM: Cardiac TAVR CT   TECHNIQUE: A non-contrast, gated CT scan was obtained with axial slices of 3 mm through the heart for aortic valve calcium scoring. A 110 kV retrospective, gated, contrast cardiac scan was obtained. Gantry rotation speed was 250 msecs and collimation was 0.6 mm. Nitroglycerin was not given. The 3D data set was reconstructed in 5% intervals of the 0-95% of the R-R cycle. Systolic and diastolic phases were analyzed on a dedicated workstation using MPR, MIP, and VRT modes. The patient received 100 cc of contrast.   FINDINGS: Image quality: Excellent.   Noise artifact is: Limited.   Valve Morphology: The aortic valve is tricuspid with severely diffusely calcified leaflets. Severely restricted leaflet motion in systole. There is bulky calcification of the [place].   Aortic Valve Calcium score: 1627   Aortic annular dimension:   Phase assessed: 10%   Annular area: 450 mm2   Annular perimeter: 76.9 mm   Max diameter: 28.3 mm   Min diameter: 20.9 mm   Annular and subannular calcification: Single, mild calcification under the [place].   Membranous septum length: 9.6 mm   Optimal coplanar projection: LAO 16 CRA 4   Coronary Artery Height above Annulus:   Left Main: 16.7 mm    Right Coronary: 20.7 mm   Sinus of Valsalva Measurements:   Non-coronary: 36.2 mm   Right-coronary: 33.8  mm   Left-coronary: 34.5 mm   Sinus of Valsalva Height:   Non-coronary: 25.1 mm   Right-coronary: 25.7 mm   Left-coronary: 23.7 mm   Sinotubular Junction: 29 mm   Ascending Thoracic Aorta: 32 mm   Coronary Arteries: Normal coronary origin. Right dominance. The study was performed without use of NTG and is insufficient for plaque evaluation. Please refer to recent cardiac catheterization for coronary assessment. 3-vessel coronary calcifications.   Cardiac Morphology:   Right Atrium: Right atrial size is dilated.   Right Ventricle: The right ventricular cavity is dilated.   Left Atrium: Left atrial size is dilated. There is contrast mixing artifact in the LAA, cannot exclude thrombus.   Left Ventricle: The ventricular cavity size is within normal limits. There are no stigmata of prior infarction. There is no abnormal filling defect. Normal left ventricular function, EF=65%. No wall motion abnormalities.   Pulmonary arteries: Dilated, suggestive of pulmonary hypertension. No proximal filling defect.   Pulmonary veins: Normal pulmonary venous drainage.   Pericardium: Normal thickness with no significant effusion or calcium present.   Mitral Valve: The mitral valve is normal structure without significant calcification.   Extra-cardiac findings: See attached radiology report for non-cardiac structures.   IMPRESSION: 1. Tricuspid aortic valve with bulky calcification of the NCC.   2. Annular measurements appropriate for 26 mm S3 TAVR (450 mm2).   3. Single, mild calcification under the [place].   4. Sufficient coronary to annulus distance.   5. Optimal Fluoroscopic Angle for Delivery: LAO 16 CRA 4   6. Dilated pulmonary artery suggestive of pulmonary hypertension.   7. There is contrast mixing artifact in the LAA, cannot exclude thrombus.   [place] T.  Audie Box, MD     Electronically Signed   By: Eleonore Chiquito M.D.   On: 10/17/2021 14:39    Addended by Geralynn Rile, MD on 10/17/2021  2:41 PM   Study Result  Narrative & Impression  EXAM: OVER-READ INTERPRETATION  CT CHEST   The following report is an over-read performed by radiologist Dr. Vinnie Langton of [facility], [place] on 10/17/2021. This over-read does not include interpretation of cardiac or coronary anatomy or pathology. The coronary calcium score/coronary CTA interpretation by the cardiologist is attached.   COMPARISON:  None.   FINDINGS: Extracardiac findings will be described separately under dictation for contemporaneously obtained CTA chest, abdomen and pelvis.   IMPRESSION: Please see separate dictation for contemporaneously obtained CTA chest, abdomen and pelvis dated 10/17/2021 for full description of relevant extracardiac findings.   Electronically Signed: By: Vinnie Langton M.D. On: 10/17/2021 11:01    Impression:   The patient is a 88 year old gentleman with stage D, severe, symptomatic aortic stenosis with New York Heart Association class 3 symptoms of exertional fatigue and shortness of breath consistent with chronic diastolic congestive heart failure.  He has also been having some episodes of dizziness but no syncope.  I have personally reviewed his 2D echocardiogram, cardiac catheterization, and CTA studies.  His echo shows a severely calcified aortic valve with restricted mobility.  The mean gradient was only measured at 25 mmHg but there is moderate aortic insufficiency.  His valve area calculations were felt to be inaccurate due to the presence of subvalvular obstruction with severe asymmetric septal hypertrophy as well as moderate aortic insufficiency.  He also has moderate mitral regurgitation.  He was doing well until about 6 months ago and now has progressive exertional symptoms suggesting that worsening aortic stenosis may be a  major factor and we may expect significant improvement in his functional status if that is corrected.  I think is reasonable to proceed with aortic valve replacement by TAVR.  His gated cardiac CTA shows anatomy suitable for TAVR using a 23 mm SAPIEN 3 valve.  His abdominal and pelvic CTA shows adequate pelvic vascular anatomy to allow transfemoral insertion.  The patient and his son were counseled at length regarding treatment alternatives for management of severe symptomatic aortic stenosis. The risks and benefits of surgical intervention has been discussed in detail. Long-term prognosis with medical therapy was discussed. Alternative approaches such as conventional surgical aortic valve replacement, transcatheter aortic valve replacement, and palliative medical therapy were compared and contrasted at length. This discussion was placed in the context of the patient's own specific clinical presentation and past medical history. All of their questions have been addressed.   Following the decision to proceed with transcatheter aortic valve replacement, a discussion was held regarding what types of management strategies would be attempted intraoperatively in the event of life-threatening complications, including whether or not the patient would be considered a candidate for the use of cardiopulmonary bypass and/or conversion to open sternotomy for attempted surgical intervention.  I do not think he is a candidate for emergent sternotomy to manage any intraoperative complications at his age of 83.  The patient is aware of the fact that transient use of cardiopulmonary bypass may be necessary. The patient has been advised of a variety of complications that might develop including but not limited to risks of death, stroke, paravalvular leak, aortic dissection or other major vascular complications, aortic annulus rupture, device embolization, cardiac rupture or perforation, mitral regurgitation, acute myocardial  infarction, arrhythmia, heart block or bradycardia requiring permanent pacemaker placement, congestive heart failure, respiratory failure, renal failure, pneumonia, infection, other late complications related to structural valve deterioration or migration, or other complications that might ultimately cause a temporary or permanent loss of functional independence or other long term morbidity. The patient provides full informed consent for the procedure as described and all questions were answered.      Plan:  He will be scheduled for transfemoral TAVR using a SAPIEN 3 valve on 11/12/2021.  I spent 60 minutes performing this consultation and > 50% of this time was spent face to face counseling and coordinating the care of this patient's severe symptomatic aortic stenosis.   Gaye Pollack, MD 11/06/2021

## 2021-11-08 ENCOUNTER — Ambulatory Visit (HOSPITAL_COMMUNITY)
Admission: RE | Admit: 2021-11-08 | Discharge: 2021-11-08 | Disposition: A | Discharge status: Home / Self Care | Payer: Medicare Other | Source: Ambulatory Visit | Attending: Physician Assistant | Admitting: Physician Assistant

## 2021-11-08 ENCOUNTER — Encounter (HOSPITAL_COMMUNITY): Payer: Self-pay

## 2021-11-08 ENCOUNTER — Encounter (HOSPITAL_COMMUNITY)
Admission: RE | Admit: 2021-11-08 | Discharge: 2021-11-08 | Disposition: A | Discharge status: Home / Self Care | Payer: Medicare Other | Source: Ambulatory Visit | Attending: Cardiovascular Disease | Admitting: Cardiovascular Disease

## 2021-11-08 ENCOUNTER — Other Ambulatory Visit: Payer: Self-pay

## 2021-11-08 VITALS — BP 147/70 | HR 51 | Temp 97.5°F | Resp 18 | Ht 66.0 in | Wt 151.7 lb

## 2021-11-08 DIAGNOSIS — D696 Thrombocytopenia, unspecified: Secondary | ICD-10-CM | POA: Insufficient documentation

## 2021-11-08 DIAGNOSIS — I872 Venous insufficiency (chronic) (peripheral): Secondary | ICD-10-CM | POA: Diagnosis not present

## 2021-11-08 DIAGNOSIS — I4510 Unspecified right bundle-branch block: Secondary | ICD-10-CM | POA: Insufficient documentation

## 2021-11-08 DIAGNOSIS — Z8546 Personal history of malignant neoplasm of prostate: Secondary | ICD-10-CM | POA: Insufficient documentation

## 2021-11-08 DIAGNOSIS — I10 Essential (primary) hypertension: Secondary | ICD-10-CM | POA: Insufficient documentation

## 2021-11-08 DIAGNOSIS — G629 Polyneuropathy, unspecified: Secondary | ICD-10-CM | POA: Insufficient documentation

## 2021-11-08 DIAGNOSIS — H9190 Unspecified hearing loss, unspecified ear: Secondary | ICD-10-CM | POA: Insufficient documentation

## 2021-11-08 DIAGNOSIS — I491 Atrial premature depolarization: Secondary | ICD-10-CM | POA: Diagnosis not present

## 2021-11-08 DIAGNOSIS — E785 Hyperlipidemia, unspecified: Secondary | ICD-10-CM | POA: Insufficient documentation

## 2021-11-08 DIAGNOSIS — Z01811 Encounter for preprocedural respiratory examination: Secondary | ICD-10-CM

## 2021-11-08 DIAGNOSIS — Z01818 Encounter for other preprocedural examination: Secondary | ICD-10-CM

## 2021-11-08 DIAGNOSIS — F1090 Alcohol use, unspecified, uncomplicated: Secondary | ICD-10-CM | POA: Insufficient documentation

## 2021-11-08 DIAGNOSIS — I440 Atrioventricular block, first degree: Secondary | ICD-10-CM | POA: Insufficient documentation

## 2021-11-08 DIAGNOSIS — I2510 Atherosclerotic heart disease of native coronary artery without angina pectoris: Secondary | ICD-10-CM | POA: Insufficient documentation

## 2021-11-08 DIAGNOSIS — I350 Nonrheumatic aortic (valve) stenosis: Secondary | ICD-10-CM | POA: Insufficient documentation

## 2021-11-08 DIAGNOSIS — I422 Other hypertrophic cardiomyopathy: Secondary | ICD-10-CM | POA: Insufficient documentation

## 2021-11-08 DIAGNOSIS — R609 Edema, unspecified: Secondary | ICD-10-CM | POA: Insufficient documentation

## 2021-11-08 DIAGNOSIS — I272 Pulmonary hypertension, unspecified: Secondary | ICD-10-CM | POA: Insufficient documentation

## 2021-11-08 DIAGNOSIS — K529 Noninfective gastroenteritis and colitis, unspecified: Secondary | ICD-10-CM | POA: Diagnosis not present

## 2021-11-08 DIAGNOSIS — Z955 Presence of coronary angioplasty implant and graft: Secondary | ICD-10-CM | POA: Diagnosis not present

## 2021-11-08 DIAGNOSIS — Z20822 Contact with and (suspected) exposure to covid-19: Secondary | ICD-10-CM | POA: Diagnosis not present

## 2021-11-08 DIAGNOSIS — R011 Cardiac murmur, unspecified: Secondary | ICD-10-CM | POA: Diagnosis not present

## 2021-11-08 DIAGNOSIS — J90 Pleural effusion, not elsewhere classified: Secondary | ICD-10-CM | POA: Diagnosis not present

## 2021-11-08 DIAGNOSIS — G4733 Obstructive sleep apnea (adult) (pediatric): Secondary | ICD-10-CM | POA: Diagnosis not present

## 2021-11-08 DIAGNOSIS — I6523 Occlusion and stenosis of bilateral carotid arteries: Secondary | ICD-10-CM | POA: Diagnosis not present

## 2021-11-08 DIAGNOSIS — J9811 Atelectasis: Secondary | ICD-10-CM | POA: Insufficient documentation

## 2021-11-08 DIAGNOSIS — I2582 Chronic total occlusion of coronary artery: Secondary | ICD-10-CM | POA: Insufficient documentation

## 2021-11-08 DIAGNOSIS — I517 Cardiomegaly: Secondary | ICD-10-CM | POA: Diagnosis not present

## 2021-11-08 HISTORY — DX: Malignant neoplasm of prostate: C61

## 2021-11-08 LAB — BRAIN NATRIURETIC PEPTIDE: B Natriuretic Peptide: >4500 pg/mL — ABNORMAL HIGH (ref 0.0–100.0)

## 2021-11-08 LAB — HEMOGLOBIN A1C
Hgb A1c MFr Bld: 4.9 % (ref 4.8–5.6)
Mean Plasma Glucose: 93.93 mg/dL

## 2021-11-08 LAB — COMPREHENSIVE METABOLIC PANEL
ALT: 18 U/L (ref 0–44)
AST: 25 U/L (ref 15–41)
Albumin: 3.9 g/dL (ref 3.5–5.0)
Alkaline Phosphatase: 47 U/L (ref 38–126)
Anion gap: 11 (ref 5–15)
BUN: 31 mg/dL — ABNORMAL HIGH (ref 8–23)
CO2: 18 mmol/L — ABNORMAL LOW (ref 22–32)
Calcium: 9.3 mg/dL (ref 8.9–10.3)
Chloride: 109 mmol/L (ref 98–111)
Creatinine, Ser: 1.48 mg/dL — ABNORMAL HIGH (ref 0.61–1.24)
GFR, Estimated: 44 mL/min — ABNORMAL LOW (ref >60)
Glucose, Bld: 93 mg/dL (ref 70–99)
Potassium: 5 mmol/L (ref 3.5–5.1)
Sodium: 138 mmol/L (ref 135–145)
Total Bilirubin: 2.9 mg/dL — ABNORMAL HIGH (ref 0.3–1.2)
Total Protein: 6.3 g/dL — ABNORMAL LOW (ref 6.5–8.1)

## 2021-11-08 LAB — BLOOD GAS, ARTERIAL
Acid-base deficit: 2.9 mmol/L — ABNORMAL HIGH (ref 0.0–2.0)
Bicarbonate: 21.4 mmol/L (ref 20.0–28.0)
Drawn by: 60286
FIO2: 21
O2 Saturation: 95.6 %
Patient temperature: 37
pCO2 arterial: 37.2 mmHg (ref 32.0–48.0)
pH, Arterial: 7.378 (ref 7.350–7.450)
pO2, Arterial: 82.2 mmHg — ABNORMAL LOW (ref 83.0–108.0)

## 2021-11-08 LAB — TYPE AND SCREEN
ABO/RH(D): A NEG
Antibody Screen: NEGATIVE

## 2021-11-08 LAB — SURGICAL PCR SCREEN
MRSA, PCR: NEGATIVE
Staphylococcus aureus: NEGATIVE

## 2021-11-08 LAB — URINALYSIS, ROUTINE W REFLEX MICROSCOPIC
Bilirubin Urine: NEGATIVE
Glucose, UA: NEGATIVE mg/dL
Hgb urine dipstick: NEGATIVE
Ketones, ur: NEGATIVE mg/dL
Leukocytes,Ua: NEGATIVE
Nitrite: NEGATIVE
Protein, ur: 30 mg/dL — AB
Specific Gravity, Urine: 1.025 (ref 1.005–1.030)
pH: 5.5 (ref 5.0–8.0)

## 2021-11-08 LAB — CBC
HCT: 39.6 % (ref 39.0–52.0)
Hemoglobin: 12.8 g/dL — ABNORMAL LOW (ref 13.0–17.0)
MCH: 32.2 pg (ref 26.0–34.0)
MCHC: 32.3 g/dL (ref 30.0–36.0)
MCV: 99.5 fL (ref 80.0–100.0)
Platelets: 121 10*3/uL — ABNORMAL LOW (ref 150–400)
RBC: 3.98 MIL/uL — ABNORMAL LOW (ref 4.22–5.81)
RDW: 15.7 % — ABNORMAL HIGH (ref 11.5–15.5)
WBC: 5.4 10*3/uL (ref 4.0–10.5)
nRBC: 0 % (ref 0.0–0.2)

## 2021-11-08 LAB — URINALYSIS, MICROSCOPIC (REFLEX): Bacteria, UA: NONE SEEN

## 2021-11-08 LAB — SARS CORONAVIRUS 2 (TAT 6-24 HRS): SARS Coronavirus 2: NEGATIVE

## 2021-11-08 LAB — PROTIME-INR
INR: 1.2 (ref 0.8–1.2)
Prothrombin Time: 14.8 seconds (ref 11.4–15.2)

## 2021-11-08 NOTE — Progress Notes (Signed)
PCP - Dr. Billey Gosling, MD Cardiologist - Dr. Sherren Mocha  PPM/ICD - n/a Device Orders -  Rep Notified -   Chest x-ray - 11/08/21 EKG - 11/08/21 Stress Test - patient cannot recall ECHO - 09/05/21 Cardiac Cath - 09/12/21  Sleep Study - patient cannot recall CPAP - patient does not wear it  Fasting Blood Sugar -  n/a Checks Blood Sugar _____ times a day  Blood Thinner Instructions: n/a Aspirin Instructions: continue without stopping, do not take day of procedure  ERAS Protcol - NPO per order PRE-SURGERY Ensure or G2-   COVID TEST- at PAT appointment   Anesthesia review: yes, history of CAD  Patient denies shortness of breath, fever, cough and chest pain at PAT appointment   All instructions explained to the patient, with a verbal understanding of the material. Patient agrees to go over the instructions while at home for a better understanding. Patient also instructed to self quarantine after being tested for COVID-19. The opportunity to ask questions was provided.

## 2021-11-08 NOTE — Progress Notes (Signed)
Surgical Instructions   Your procedure is scheduled on Tuesday 11/12/21  Report to [facility] Main Entrance "A" at 05:30 A.M., then check in with the Admitting office.  Call [phone] if you have problems or questions between now and the morning of surgery:   Remember: Do not eat or drink after midnight the night before your surgery  Please continue taking all of your current medications through the day before surgery.   On the day of surgery take no medications   After your pre-procedure COVID test  You are not required to quarantine however you are required to wear a well-fitting mask when you are out and around people not in your household.  If your mask becomes wet or soiled, replace with a new one.  Wash your hands often with soap and water for 20 seconds or clean your hands with an alcohol-based hand sanitizer that contains at least 60% alcohol.  Do not share personal items.  Notify your provider: if you are in close contact with someone who has COVID  or if you develop a fever of 100.4 or greater, sneezing, cough, sore throat, shortness of breath or body aches.          Do not wear jewelry   Do not wear lotions, powders, colognes, or deodorant.  Do not shave 48 hours prior to surgery.  Men may shave face and neck.  Do not bring valuables to the hospital - [facility] is not responsible for any belongings or valuables.  Do NOT Smoke (Tobacco/Vaping) or drink Alcohol 24 hours prior to your procedure  If you use a CPAP at night, please bring your mask for your overnight stay.   Contacts, glasses, hearing aids, dentures or partials may not be worn into surgery, please bring cases for these belongings   For patients admitted to the hospital, discharge time will be determined by your treatment team.   Patients discharged the day of surgery will not be allowed to drive home, and someone needs to stay with them for 24 hours.  NO VISITORS WILL BE ALLOWED IN PRE-OP WHERE  PATIENTS ARE PREPPED FOR SURGERY.  ONLY 1 SUPPORT PERSON MAY BE PRESENT IN THE WAITING ROOM WHILE YOU ARE IN SURGERY.  IF YOU ARE TO BE ADMITTED, ONCE YOU ARE IN YOUR ROOM YOU WILL BE ALLOWED TWO (2) VISITORS. 1 (ONE) VISITOR MAY STAY OVERNIGHT BUT MUST ARRIVE TO THE ROOM BY 8pm.  Minor children may have two parents present. Special consideration for safety and communication needs will be reviewed on a case by case basis.  Special instructions:    Oral Hygiene is also important to reduce your risk of infection.  Remember - BRUSH YOUR TEETH THE MORNING OF SURGERY WITH YOUR REGULAR TOOTHPASTE   [facility]- Preparing For Surgery  Before surgery, you can play an important role. Because skin is not sterile, your skin needs to be as free of germs as possible. You can reduce the number of germs on your skin by washing with CHG (chlorahexidine gluconate) Soap before surgery.  CHG is an antiseptic cleaner which kills germs and bonds with the skin to continue killing germs even after washing.     Please do not use if you have an allergy to CHG or antibacterial soaps. If your skin becomes reddened/irritated stop using the CHG.  Do not shave (including legs and underarms) for at least 48 hours prior to first CHG shower. It is OK to shave your face.  Please follow these  instructions carefully.     Shower the NIGHT BEFORE SURGERY and the MORNING OF SURGERY with CHG Soap.   If you chose to wash your hair, wash your hair first as usual with your normal shampoo. After you shampoo, rinse your hair and body thoroughly to remove the shampoo.    Then [employer] and genitals (private parts) with your normal soap and rinse thoroughly to remove soap.  Next use the CHG Soap as you would any other liquid soap. You can apply CHG directly to the skin and wash gently with a clean washcloth.   Apply the CHG Soap to your body ONLY FROM THE NECK DOWN.  Do not use on open wounds or open sores. Avoid contact with your eyes,  ears, mouth and genitals (private parts). Wash Face and genitals (private parts)  with your normal soap.   Wash thoroughly, paying special attention to the area where your surgery will be performed.  Thoroughly rinse your body with warm water from the neck down.  DO NOT shower/wash with your normal soap after using and rinsing off the CHG Soap.  Pat yourself dry with a CLEAN TOWEL.  Wear CLEAN PAJAMAS to bed the night before surgery  Place CLEAN SHEETS on your bed the night before your surgery  DO NOT SLEEP WITH PETS.   Day of Surgery:  Take a shower with CHG soap. Wear Clean/Comfortable clothing the morning of surgery Do not apply any deodorants/lotions.   Remember to brush your teeth WITH YOUR REGULAR TOOTHPASTE.   Please read over the fact sheets that you were given.

## 2021-11-08 NOTE — Pre-Procedure Instructions (Signed)
Surgical Instructions    Your procedure is scheduled on Tuesday 11/12/21.   Report to [facility] Main Entrance "A" at 05:30 A.M., then check in with the Admitting office.  Call this number if you have problems the morning of surgery:  [phone]   If you have any questions prior to your surgery date call [phone]: Open Monday-Friday 8am-4pm    Remember:  Do not eat or drink after midnight the night before your surgery   Per your surgeon's instructions. Please continue taking all of your current medications through the day before surgery. On the day of surgery take no medications.   After your COVID test   You are not required to quarantine however you are required to wear a well-fitting mask when you are out and around people not in your household.  If your mask becomes wet or soiled, replace with a new one.  Wash your hands often with soap and water for 20 seconds or clean your hands with an alcohol-based hand sanitizer that contains at least 60% alcohol.  Do not share personal items.  Notify your provider: if you are in close contact with someone who has COVID  or if you develop a fever of 100.4 or greater, sneezing, cough, sore throat, shortness of breath or body aches.           Do not wear jewelry or makeup Do not wear lotions, powders, perfumes/colognes, or deodorant. Do not shave 48 hours prior to surgery.  Men may shave face and neck. Do not bring valuables to the hospital. Do not wear nail polish, gel polish, artificial nails, or any other type of covering on natural nails (fingers and toes) If you have artificial nails or gel coating that need to be removed by a nail salon, please have this removed prior to surgery. Artificial nails or gel coating may interfere with anesthesia's ability to adequately monitor your vital signs.             [facility] is not responsible for any belongings or valuables.  Do NOT Smoke (Tobacco/Vaping)  24 hours prior to your  procedure  If you use a CPAP at night, you may bring your mask for your overnight stay.   Contacts, glasses, hearing aids, dentures or partials may not be worn into surgery, please bring cases for these belongings   For patients admitted to the hospital, discharge time will be determined by your treatment team.   Patients discharged the day of surgery will not be allowed to drive home, and someone needs to stay with them for 24 hours.  NO VISITORS WILL BE ALLOWED IN PRE-OP WHERE PATIENTS ARE PREPPED FOR SURGERY.  ONLY 1 SUPPORT PERSON MAY BE PRESENT IN THE WAITING ROOM WHILE YOU ARE IN SURGERY.  IF YOU ARE TO BE ADMITTED, ONCE YOU ARE IN YOUR ROOM YOU WILL BE ALLOWED TWO (2) VISITORS. 1 (ONE) VISITOR MAY STAY OVERNIGHT BUT MUST ARRIVE TO THE ROOM BY 8pm.  Minor children may have two parents present. Special consideration for safety and communication needs will be reviewed on a case by case basis.  Special instructions:    Oral Hygiene is also important to reduce your risk of infection.  Remember - BRUSH YOUR TEETH THE MORNING OF SURGERY WITH YOUR REGULAR TOOTHPASTE   [facility]- Preparing For Surgery  Before surgery, you can play an important role. Because skin is not sterile, your skin needs to be as free of germs as possible. You can reduce the  number of germs on your skin by washing with CHG (chlorahexidine gluconate) Soap before surgery.  CHG is an antiseptic cleaner which kills germs and bonds with the skin to continue killing germs even after washing.     Please do not use if you have an allergy to CHG or antibacterial soaps. If your skin becomes reddened/irritated stop using the CHG.  Do not shave (including legs and underarms) for at least 48 hours prior to first CHG shower. It is OK to shave your face.  Please follow these instructions carefully.     Shower the NIGHT BEFORE SURGERY and the MORNING OF SURGERY with CHG Soap.   If you chose to wash your hair, wash your hair first  as usual with your normal shampoo. After you shampoo, rinse your hair and body thoroughly to remove the shampoo.  Then [employer] and genitals (private parts) with your normal soap and rinse thoroughly to remove soap.  After that Use CHG Soap as you would any other liquid soap. You can apply CHG directly to the skin and wash gently with a scrungie or a clean washcloth.   Apply the CHG Soap to your body ONLY FROM THE NECK DOWN.  Do not use on open wounds or open sores. Avoid contact with your eyes, ears, mouth and genitals (private parts). Wash Face and genitals (private parts)  with your normal soap.   Wash thoroughly, paying special attention to the area where your surgery will be performed.  Thoroughly rinse your body with warm water from the neck down.  DO NOT shower/wash with your normal soap after using and rinsing off the CHG Soap.  Pat yourself dry with a CLEAN TOWEL.  Wear CLEAN PAJAMAS to bed the night before surgery  Place CLEAN SHEETS on your bed the night before your surgery  DO NOT SLEEP WITH PETS.   Day of Surgery:  Take a shower with CHG soap. Wear Clean/Comfortable clothing the morning of surgery Do not apply any deodorants/lotions.   Remember to brush your teeth WITH YOUR REGULAR TOOTHPASTE.   Please read over the following fact sheets that you were given.

## 2021-11-11 ENCOUNTER — Other Ambulatory Visit (HOSPITAL_COMMUNITY): Payer: Self-pay | Admitting: *Deleted

## 2021-11-11 MED ORDER — CEFAZOLIN SODIUM-DEXTROSE 2-4 GM/100ML-% IV SOLN
2.0000 g | INTRAVENOUS | Status: AC
  Administered 2021-11-12: 2 g via INTRAVENOUS
  Filled 2021-11-11: qty 100

## 2021-11-11 MED ORDER — HEPARIN 30,000 UNITS/1000 ML (OHS) CELLSAVER SOLUTION
Status: DC
  Filled 2021-11-11: qty 1000

## 2021-11-11 MED ORDER — POTASSIUM CHLORIDE 2 MEQ/ML IV SOLN
80.0000 meq | INTRAVENOUS | Status: DC
  Filled 2021-11-11: qty 40

## 2021-11-11 MED ORDER — DEXMEDETOMIDINE HCL IN NACL 400 MCG/100ML IV SOLN
0.1000 ug/kg/h | INTRAVENOUS | Status: AC
  Administered 2021-11-12: .7 ug/kg/h via INTRAVENOUS
  Administered 2021-11-12: 51.6 ug via INTRAVENOUS
  Filled 2021-11-11: qty 100

## 2021-11-11 MED ORDER — MAGNESIUM SULFATE 50 % IJ SOLN
40.0000 meq | INTRAMUSCULAR | Status: DC
  Filled 2021-11-11: qty 9.85

## 2021-11-11 MED ORDER — NOREPINEPHRINE 4 MG/250ML-% IV SOLN
0.0000 ug/min | INTRAVENOUS | Status: AC
  Administered 2021-11-12: 1 ug/min via INTRAVENOUS
  Filled 2021-11-11: qty 250

## 2021-11-11 NOTE — Progress Notes (Signed)
Anesthesia Chart Review:  Case: [id number] Date/Time: 11/12/21 0715   Procedures:      TRANSCATHETER AORTIC VALVE REPLACEMENT, TRANSFEMORAL (Chest)     INTRAOPERATIVE TRANSTHORACIC ECHOCARDIOGRAM   Anesthesia type: Monitor Anesthesia Care   Pre-op diagnosis: AO   Location: [facility] ROOM 9 / [place] [facility]   Surgeons: Sherren Mocha, MD     CT Surgeon: Gilford Raid, MD  DISCUSSION: Patient is a 88 year old male scheduled for the above procedure.  History includes never smoker, murmur/aortic stenosis, CAD (PCI RCA, CX 1999; CTO RCA, collateralized from LAD 10/02/21), hypertrophic CM (history of LVH with septal hypertrophy and LV outflow tract obstruction; no outlet tract obstruction 09/25/21 echo), carotid artery stenosis (< 60% BICA ~ 2010), HTN, OSA (does not use CPAP), pulmonary hypertension (severe RHC 10/02/21), HLD, venous insufficiency, edema, peripheral neuropathy, colitis, prostate cancer, hearing loss.  11/08/2021 presurgical COVID-19 test negative.  Anesthesia team to evaluate on the day of surgery.   VS: BP (!) 147/70    Pulse (!) 51    Temp (!) 36.4 C (Oral)    Resp 18    Ht 5\' 6"  (1.676 m)    Wt 68.8 kg    SpO2 96%    BMI 24.49 kg/m    PROVIDERS: Binnie Rail, MD is PCP  Sherren Mocha, MD is cardiologist Scarlette Shorts, MD is GI Sullivan Lone, MD is hematologist. Evaluated 04/22/18 for thrombocytopenia, mild and stable over the previous 7 years. "Low platelets could be attributable to mild, consistent ETOH consumption". No leukocytopenia. As needed follow-up until PLT < 70K.    LABS: Preoperative labs noted. Angelena Form, PA-C has already reviewed, "Pre op labs stable. Elevated BNP noted." (all labs ordered are listed, but only abnormal results are displayed)  Labs Reviewed  CBC - Abnormal; Notable for the following components:      Result Value   RBC 3.98 (*)    Hemoglobin 12.8 (*)    RDW 15.7 (*)    Platelets 121 (*)    All other components within normal limits   COMPREHENSIVE METABOLIC PANEL - Abnormal; Notable for the following components:   CO2 18 (*)    BUN 31 (*)    Creatinine, Ser 1.48 (*)    Total Protein 6.3 (*)    Total Bilirubin 2.9 (*)    GFR, Estimated 44 (*)    All other components within normal limits  URINALYSIS, ROUTINE W REFLEX MICROSCOPIC - Abnormal; Notable for the following components:   Protein, ur 30 (*)    All other components within normal limits  BLOOD GAS, ARTERIAL - Abnormal; Notable for the following components:   pO2, Arterial 82.2 (*)    Acid-base deficit 2.9 (*)    Allens test (pass/fail) BRACHIAL ARTERY (*)    All other components within normal limits  BRAIN NATRIURETIC PEPTIDE - Abnormal; Notable for the following components:   B Natriuretic Peptide >4,500.0 (*)    All other components within normal limits  SURGICAL PCR SCREEN  SARS CORONAVIRUS 2 (TAT 6-24 HRS)  PROTIME-INR  HEMOGLOBIN A1C  URINALYSIS, MICROSCOPIC (REFLEX)  TYPE AND SCREEN     IMAGES: CXR 11/08/21: IMPRESSION: Small bilateral pleural effusions with underlying atelectasis, stable. No other interval changes.   CTA chest/abd/pelvis 10/17/21: IMPRESSION: 1. Vascular findings and measurements pertinent to potential TAVR procedure, as detailed above. [See full report] 2. Severe thickening and calcification of the aortic valve, compatible with reported clinical history of severe aortic stenosis. 3. Aortic atherosclerosis, in addition to left main  and three-vessel coronary artery disease. 4. Moderate bilateral pleural effusions lying dependently with some passive subsegmental atelectasis in the lower lobes of the lungs bilaterally. 5. Cholelithiasis without evidence of acute cholecystitis at this time. 6. Colonic diverticulosis without evidence of acute diverticulitis. 7. Additional incidental findings, as above. [See full report]     EKG: 11/08/21: Sinus bradycardia with 1st degree A-V block with Premature supraventricular  complexes Incomplete right bundle branch block Nonspecific ST and T wave abnormality Abnormal ECG No previous ECGs available Confirmed by Candee Furbish [phone]) on 11/09/2021 7:37:53 AM   CV: CT Coronary 10/17/21: IMPRESSION: 1. Tricuspid aortic valve with bulky calcification of the NCC. 2. Annular measurements appropriate for 26 mm S3 TAVR (450 mm2). 3. Single, mild calcification under the [place]. 4. Sufficient coronary to annulus distance. 5. Optimal Fluoroscopic Angle for Delivery: LAO 16 CRA 4 6. Dilated pulmonary artery suggestive of pulmonary hypertension. 7. There is contrast mixing artifact in the LAA, cannot exclude thrombus.   RHC/LHC 10/02/21: 1.  Severe single-vessel coronary artery disease with chronic occlusion of the RCA, collateralized from the left coronary artery. 2.  Calcified, restricted aortic valve on plain fluoroscopy, difficult to cross with a straight wire, but low mean transaortic gradient of only 8 mmHg. 3.  Severe pulmonary hypertension with pulmonary artery pressure of 78/29, mean 48 mmHg. 4.  Transpulmonary gradient 23 mmHg, PVR 4.6 Wood units.  Recommendations: Further valve team review to consider TAVR in this patient with what appears to be severe aortic stenosis by noninvasive assessment, medical therapy for coronary artery disease, consider restrictive cardiomyopathy.    Echo 09/05/21: IMPRESSIONS   1. The aortic valve is severely calcified with restricted movement in  systole. Visually, there is concern for severe aortic stenosis. Vmax 3.4  m/s, MG 25 mmHG. The AVA and DI are overestimated in the setting of  moderate aortic regurgitation. Would  recommend TEE for clarification of AS/AI severity. The aortic valve is  tricuspid. There is severe calcifcation of the aortic valve. There is  severe thickening of the aortic valve. Aortic valve regurgitation is  moderate.   2. There is severe asymmetric hypertrophy of the basal septum consistent  with prior  history of hypertrophic cardiomyopathy. No signs of mitral  valve SAM or outflow tract obstruction. Left ventricular ejection  fraction, by estimation, is 50 to 55%. Left  ventricular ejection fraction by 3D volume is 52 %. The left ventricle has  low normal function. The left ventricle has no regional wall motion  abnormalities. There is severe asymmetric left ventricular hypertrophy of  the basal-septal segment. Left  ventricular diastolic parameters are consistent with Grade III diastolic  dysfunction (restrictive). Elevated left ventricular end-diastolic  pressure. The E/e' is 23.7.   3. Right ventricular systolic function is moderately reduced. The right  ventricular size is moderately enlarged. There is severely elevated  pulmonary artery systolic pressure. The estimated right ventricular  systolic pressure is 57.8 mmHg.   4. Left atrial size was severely dilated.   5. Right atrial size was severely dilated.   6. The mitral valve is degenerative. Moderate mitral valve regurgitation.   7. The inferior vena cava is dilated in size with <50% respiratory  variability, suggesting right atrial pressure of 15 mmHg.  - Comparison(s): Changes from prior study are noted. Concerns for severe AS  as detailed above.    According to 09/16/10 office note by Dr. Burt Knack, "Moderate carotid stenosis less than 60% bilateral last year."    Past Medical History:  Diagnosis Date   Anxiety    Aortic stenosis    Arthralgia    Arthritis    CAD (coronary artery disease)    Carotid stenosis    Cervicalgia    CLUSTER HEADACHE 02/10/2007   Qualifier: Diagnosis of  By: Linna Darner MD, Gwyndolyn Saxon     Colitis    Edema    Gout    Hearing loss    Herpes zoster without mention of complication    Hyperlipidemia    Hypertension    Hyperuricemia    Impacted cerumen    Migraine    Migraine    Murmur    Olecranon bursitis    OSA (obstructive sleep apnea)    Other and unspecified hyperlipidemia    Other  malaise and fatigue    Peripheral neuropathy    Polyneuropathy    Prostate cancer ([place])    Pulmonary hypertension ([place])    Unspecified hereditary and idiopathic peripheral neuropathy    Venous insufficiency     Past Surgical History:  Procedure Laterality Date   ANGIOPLASTY     w/ 2 stents in [place] Bilateral    HEMORRHOID SURGERY     LIPOMA EXCISION     RIGHT/LEFT HEART CATH AND CORONARY ANGIOGRAPHY N/A 10/02/2021   Procedure: RIGHT/LEFT HEART CATH AND CORONARY ANGIOGRAPHY;  Surgeon: Sherren Mocha, MD;  Location: [facility];  Service: Cardiovascular;  Laterality: N/A;   TONSILLECTOMY      MEDICATIONS:  allopurinol (ZYLOPRIM) 300 MG tablet   aspirin EC 81 MG tablet   Cholecalciferol (VITAMIN D3) 50 MCG (2000 UT) capsule   furosemide (LASIX) 20 MG tablet   gabapentin (NEURONTIN) 600 MG tablet   metoprolol tartrate (LOPRESSOR) 25 MG tablet   pravastatin (PRAVACHOL) 40 MG tablet   vitamin B-12 (CYANOCOBALAMIN) 500 MCG tablet   No current facility-administered medications for this encounter.    Myra Gianotti, PA-C Surgical Short Stay/Anesthesiology [facility] Phone [phone] [facility] Phone [phone] 11/11/2021 10:21 AM

## 2021-11-11 NOTE — Anesthesia Preprocedure Evaluation (Addendum)
Anesthesia Evaluation  Patient identified by MRN, date of birth, ID band Patient awake    Reviewed: Allergy & Precautions, NPO status , Patient's Chart, lab work & pertinent test results  History of Anesthesia Complications Negative for: history of anesthetic complications  Airway Mallampati: III  TM Distance: >3 FB Neck ROM: Full    Dental  (+) Edentulous Upper, Edentulous Lower   Pulmonary sleep apnea ,    Pulmonary exam normal        Cardiovascular hypertension, Pt. on home beta blockers and Pt. on medications pulmonary hypertension+ CAD, + Peripheral Vascular Disease and +CHF  + Valvular Problems/Murmurs AS and MR  Rhythm:Regular Rate:Normal + Systolic murmurs  '22 Cath - 1.  Severe single-vessel coronary artery disease with chronic occlusion of the RCA, collateralized from the left coronary artery. 2.  Calcified, restricted aortic valve on plain fluoroscopy, difficult to cross with a straight wire, but low mean transaortic gradient of only 8 mmHg. 3.  Severe pulmonary hypertension with pulmonary artery pressure of 78/29, mean 48 mmHg. 4.  Transpulmonary gradient 23 mmHg, PVR 4.6 Wood units.  '22 TTE - The aortic valve is severely calcified with restricted movement in systole. Visually, there is concern for severe aortic stenosis. Vmax 3.4 m/s, MG 25 mmHG. The AVA and DI are overestimated in the setting of  moderate aortic regurgitation. There is severe asymmetric hypertrophy of the basal septum consistent  with prior history of hypertrophic cardiomyopathy. No signs of mitral valve SAM or outflow tract obstruction. EF 50 to 55%. Grade III diastolic dysfunction (restrictive). Right ventricular systolic function is moderately reduced. The right ventricular size is moderately enlarged. There is severely elevated pulmonary artery systolic pressure. The estimated right ventricular systolic pressure is 75.1 mmHg. LA and RA were severely  dilated. Moderate mitral valve regurgitation.    Neuro/Psych  Headaches, PSYCHIATRIC DISORDERS Anxiety  Neuromuscular disease    GI/Hepatic Neg liver ROS, GERD  Controlled,  Endo/Other  negative endocrine ROS  Renal/GU Renal InsufficiencyRenal disease     Musculoskeletal  (+) Arthritis ,   Abdominal   Peds  Hematology  (+) Blood dyscrasia, anemia ,  Plt 121k    Anesthesia Other Findings   Reproductive/Obstetrics                           Anesthesia Physical Anesthesia Plan  ASA: 4  Anesthesia Plan: MAC   Post-op Pain Management: Tylenol PO (pre-op)   Induction:   PONV Risk Score and Plan: 1 and Propofol infusion and Treatment may vary due to age or medical condition  Airway Management Planned: Natural Airway and Simple Face Mask  Additional Equipment: None  Intra-op Plan:   Post-operative Plan:   Informed Consent: I have reviewed the patients History and Physical, chart, labs and discussed the procedure including the risks, benefits and alternatives for the proposed anesthesia with the patient or authorized representative who has indicated his/her understanding and acceptance.       Plan Discussed with: CRNA and Anesthesiologist  Anesthesia Plan Comments:       Anesthesia Quick Evaluation

## 2021-11-12 ENCOUNTER — Other Ambulatory Visit: Payer: Self-pay | Admitting: Cardiology

## 2021-11-12 ENCOUNTER — Other Ambulatory Visit: Payer: Self-pay

## 2021-11-12 ENCOUNTER — Inpatient Hospital Stay (HOSPITAL_COMMUNITY): Payer: Medicare Other | Admitting: Anesthesiology

## 2021-11-12 ENCOUNTER — Inpatient Hospital Stay (HOSPITAL_COMMUNITY): Payer: Medicare Other

## 2021-11-12 ENCOUNTER — Encounter (HOSPITAL_COMMUNITY)
Admission: RE | Disposition: A | Discharge status: Home / Self Care | Payer: Self-pay | Source: Home / Self Care | Attending: Cardiovascular Disease

## 2021-11-12 ENCOUNTER — Inpatient Hospital Stay (HOSPITAL_COMMUNITY)
Admission: RE | Admit: 2021-11-12 | Discharge: 2021-11-13 | DRG: 266 | Disposition: A | Discharge status: Home / Self Care | Payer: Medicare Other | Attending: Cardiovascular Disease | Admitting: Cardiovascular Disease

## 2021-11-12 ENCOUNTER — Encounter (HOSPITAL_COMMUNITY): Payer: Self-pay | Admitting: Cardiovascular Disease

## 2021-11-12 ENCOUNTER — Inpatient Hospital Stay (HOSPITAL_COMMUNITY): Payer: Medicare Other | Admitting: Vascular Surgery

## 2021-11-12 DIAGNOSIS — Z79899 Other long term (current) drug therapy: Secondary | ICD-10-CM | POA: Diagnosis not present

## 2021-11-12 DIAGNOSIS — I350 Nonrheumatic aortic (valve) stenosis: Secondary | ICD-10-CM

## 2021-11-12 DIAGNOSIS — I110 Hypertensive heart disease with heart failure: Secondary | ICD-10-CM | POA: Diagnosis not present

## 2021-11-12 DIAGNOSIS — G43909 Migraine, unspecified, not intractable, without status migrainosus: Secondary | ICD-10-CM | POA: Diagnosis present

## 2021-11-12 DIAGNOSIS — G4733 Obstructive sleep apnea (adult) (pediatric): Secondary | ICD-10-CM | POA: Diagnosis present

## 2021-11-12 DIAGNOSIS — I422 Other hypertrophic cardiomyopathy: Secondary | ICD-10-CM | POA: Diagnosis present

## 2021-11-12 DIAGNOSIS — H9190 Unspecified hearing loss, unspecified ear: Secondary | ICD-10-CM | POA: Diagnosis not present

## 2021-11-12 DIAGNOSIS — Z952 Presence of prosthetic heart valve: Secondary | ICD-10-CM

## 2021-11-12 DIAGNOSIS — F419 Anxiety disorder, unspecified: Secondary | ICD-10-CM | POA: Diagnosis not present

## 2021-11-12 DIAGNOSIS — Z006 Encounter for examination for normal comparison and control in clinical research program: Secondary | ICD-10-CM

## 2021-11-12 DIAGNOSIS — Z955 Presence of coronary angioplasty implant and graft: Secondary | ICD-10-CM | POA: Diagnosis not present

## 2021-11-12 DIAGNOSIS — I272 Pulmonary hypertension, unspecified: Secondary | ICD-10-CM | POA: Diagnosis not present

## 2021-11-12 DIAGNOSIS — I2510 Atherosclerotic heart disease of native coronary artery without angina pectoris: Secondary | ICD-10-CM | POA: Diagnosis present

## 2021-11-12 DIAGNOSIS — Z7982 Long term (current) use of aspirin: Secondary | ICD-10-CM

## 2021-11-12 DIAGNOSIS — I509 Heart failure, unspecified: Secondary | ICD-10-CM | POA: Diagnosis not present

## 2021-11-12 DIAGNOSIS — M109 Gout, unspecified: Secondary | ICD-10-CM | POA: Diagnosis not present

## 2021-11-12 DIAGNOSIS — I10 Essential (primary) hypertension: Secondary | ICD-10-CM | POA: Diagnosis present

## 2021-11-12 DIAGNOSIS — E785 Hyperlipidemia, unspecified: Secondary | ICD-10-CM | POA: Diagnosis present

## 2021-11-12 DIAGNOSIS — G609 Hereditary and idiopathic neuropathy, unspecified: Secondary | ICD-10-CM | POA: Diagnosis present

## 2021-11-12 DIAGNOSIS — Z888 Allergy status to other drugs, medicaments and biological substances status: Secondary | ICD-10-CM | POA: Diagnosis not present

## 2021-11-12 DIAGNOSIS — I5033 Acute on chronic diastolic (congestive) heart failure: Secondary | ICD-10-CM | POA: Diagnosis present

## 2021-11-12 DIAGNOSIS — Z9049 Acquired absence of other specified parts of digestive tract: Secondary | ICD-10-CM

## 2021-11-12 HISTORY — DX: Presence of prosthetic heart valve: Z95.2

## 2021-11-12 HISTORY — PX: TRANSCATHETER AORTIC VALVE REPLACEMENT, TRANSFEMORAL: SHX6400

## 2021-11-12 HISTORY — PX: INTRAOPERATIVE TRANSTHORACIC ECHOCARDIOGRAM: SHX6523

## 2021-11-12 HISTORY — PX: ULTRASOUND GUIDANCE FOR VASCULAR ACCESS: SHX6516

## 2021-11-12 LAB — POCT I-STAT 7, (LYTES, BLD GAS, ICA,H+H)
Acid-base deficit: 1 mmol/L (ref 0.0–2.0)
Bicarbonate: 24.9 mmol/L (ref 20.0–28.0)
Calcium, Ion: 1.26 mmol/L (ref 1.15–1.40)
HCT: 33 % — ABNORMAL LOW (ref 39.0–52.0)
Hemoglobin: 11.2 g/dL — ABNORMAL LOW (ref 13.0–17.0)
O2 Saturation: 99 %
Potassium: 4.1 mmol/L (ref 3.5–5.1)
Sodium: 145 mmol/L (ref 135–145)
TCO2: 26 mmol/L (ref 22–32)
pCO2 arterial: 43.8 mmHg (ref 32.0–48.0)
pH, Arterial: 7.363 (ref 7.350–7.450)
pO2, Arterial: 133 mmHg — ABNORMAL HIGH (ref 83.0–108.0)

## 2021-11-12 LAB — POCT I-STAT, CHEM 8
BUN: 32 mg/dL — ABNORMAL HIGH (ref 8–23)
BUN: 46 mg/dL — ABNORMAL HIGH (ref 8–23)
Calcium, Ion: 1.26 mmol/L (ref 1.15–1.40)
Calcium, Ion: 1.23 mmol/L (ref 1.15–1.40)
Chloride: 111 mmol/L (ref 98–111)
Chloride: 111 mmol/L (ref 98–111)
Creatinine, Ser: 1.1 mg/dL (ref 0.61–1.24)
Creatinine, Ser: 1.1 mg/dL (ref 0.61–1.24)
Glucose, Bld: 94 mg/dL (ref 70–99)
Glucose, Bld: 97 mg/dL (ref 70–99)
HCT: 33 % — ABNORMAL LOW (ref 39.0–52.0)
HCT: 34 % — ABNORMAL LOW (ref 39.0–52.0)
Hemoglobin: 11.2 g/dL — ABNORMAL LOW (ref 13.0–17.0)
Hemoglobin: 11.6 g/dL — ABNORMAL LOW (ref 13.0–17.0)
Potassium: 4.1 mmol/L (ref 3.5–5.1)
Potassium: 4.6 mmol/L (ref 3.5–5.1)
Sodium: 145 mmol/L (ref 135–145)
Sodium: 142 mmol/L (ref 135–145)
TCO2: 24 mmol/L (ref 22–32)
TCO2: 24 mmol/L (ref 22–32)

## 2021-11-12 SURGERY — IMPLANTATION, AORTIC VALVE, TRANSCATHETER, FEMORAL APPROACH
Anesthesia: Monitor Anesthesia Care | Site: Groin

## 2021-11-12 MED ORDER — SODIUM CHLORIDE 0.9% FLUSH: 3.0000 mL | INTRAVENOUS | Status: DC | PRN

## 2021-11-12 MED ORDER — LACTATED RINGERS IV SOLN: INTRAVENOUS | Status: DC | PRN

## 2021-11-12 MED ORDER — FUROSEMIDE 10 MG/ML IJ SOLN
40.0000 mg | Freq: Once | INTRAMUSCULAR | Status: AC
Start: 2021-11-12 — End: 2021-11-12
  Administered 2021-11-12: 40 mg via INTRAVENOUS
  Filled 2021-11-12: qty 4

## 2021-11-12 MED ORDER — ONDANSETRON HCL 4 MG/2ML IJ SOLN: 4.0000 mg | Freq: Four times a day (QID) | INTRAMUSCULAR | Status: DC | PRN

## 2021-11-12 MED ORDER — ACETAMINOPHEN 325 MG PO TABS
650.0000 mg | ORAL_TABLET | Freq: Four times a day (QID) | ORAL | Status: DC | PRN
  Administered 2021-11-12: 650 mg via ORAL
  Filled 2021-11-12: qty 2

## 2021-11-12 MED ORDER — ASPIRIN EC 81 MG PO TBEC
81.0000 mg | DELAYED_RELEASE_TABLET | Freq: Every day | ORAL | Status: DC
  Administered 2021-11-13: 81 mg via ORAL
  Filled 2021-11-12: qty 1

## 2021-11-12 MED ORDER — NITROGLYCERIN IN D5W 200-5 MCG/ML-% IV SOLN
0.0000 ug/min | INTRAVENOUS | Status: DC
  Administered 2021-11-12: 5 ug/min via INTRAVENOUS
  Filled 2021-11-12: qty 250

## 2021-11-12 MED ORDER — SODIUM CHLORIDE 0.9 % IV SOLN: 250.0000 mL | INTRAVENOUS | Status: DC | PRN

## 2021-11-12 MED ORDER — LIDOCAINE HCL 1 % IJ SOLN
INTRAMUSCULAR | Status: DC | PRN
  Administered 2021-11-12: 5 mL

## 2021-11-12 MED ORDER — PROPOFOL 10 MG/ML IV BOLUS
INTRAVENOUS | Status: AC
  Filled 2021-11-12: qty 20

## 2021-11-12 MED ORDER — PROTAMINE SULFATE 10 MG/ML IV SOLN
INTRAVENOUS | Status: DC | PRN
  Administered 2021-11-12: 110 mg via INTRAVENOUS

## 2021-11-12 MED ORDER — CHLORHEXIDINE GLUCONATE 4 % EX LIQD: 60.0000 mL | Freq: Once | CUTANEOUS | Status: DC

## 2021-11-12 MED ORDER — PROPOFOL 500 MG/50ML IV EMUL
INTRAVENOUS | Status: DC | PRN
  Administered 2021-11-12: 10 mg via INTRAVENOUS
  Administered 2021-11-12: 10 ug/kg/min via INTRAVENOUS

## 2021-11-12 MED ORDER — SODIUM CHLORIDE 0.9% FLUSH
3.0000 mL | Freq: Two times a day (BID) | INTRAVENOUS | Status: DC
  Administered 2021-11-12: 3 mL via INTRAVENOUS

## 2021-11-12 MED ORDER — SODIUM CHLORIDE 0.9 % IV SOLN: 250.0000 mL | INTRAVENOUS | Status: DC

## 2021-11-12 MED ORDER — PRAVASTATIN SODIUM 40 MG PO TABS
40.0000 mg | ORAL_TABLET | Freq: Every day | ORAL | Status: DC
  Administered 2021-11-12 – 2021-11-13 (×2): 40 mg via ORAL
  Filled 2021-11-12 (×2): qty 1

## 2021-11-12 MED ORDER — ACETAMINOPHEN 650 MG RE SUPP: 650.0000 mg | Freq: Four times a day (QID) | RECTAL | Status: DC | PRN

## 2021-11-12 MED ORDER — HEPARIN 6000 UNIT IRRIGATION SOLUTION
Status: DC | PRN
  Administered 2021-11-12: 3

## 2021-11-12 MED ORDER — MORPHINE SULFATE (PF) 2 MG/ML IV SOLN: 1.0000 mg | INTRAVENOUS | Status: DC | PRN

## 2021-11-12 MED ORDER — HEPARIN SODIUM (PORCINE) 1000 UNIT/ML IJ SOLN
INTRAMUSCULAR | Status: DC | PRN
  Administered 2021-11-12: 11000 [IU] via INTRAVENOUS

## 2021-11-12 MED ORDER — CEFAZOLIN SODIUM-DEXTROSE 2-4 GM/100ML-% IV SOLN
2.0000 g | Freq: Three times a day (TID) | INTRAVENOUS | Status: AC
  Administered 2021-11-12 (×2): 2 g via INTRAVENOUS
  Filled 2021-11-12 (×2): qty 100

## 2021-11-12 MED ORDER — ALLOPURINOL 300 MG PO TABS
300.0000 mg | ORAL_TABLET | Freq: Every day | ORAL | Status: DC
  Administered 2021-11-12 – 2021-11-13 (×2): 300 mg via ORAL
  Filled 2021-11-12 (×2): qty 1

## 2021-11-12 MED ORDER — OXYCODONE HCL 5 MG PO TABS: 5.0000 mg | ORAL_TABLET | ORAL | Status: DC | PRN

## 2021-11-12 MED ORDER — CHLORHEXIDINE GLUCONATE 0.12 % MT SOLN: 15.0000 mL | Freq: Once | OROMUCOSAL | Status: AC

## 2021-11-12 MED ORDER — TRAMADOL HCL 50 MG PO TABS: 50.0000 mg | ORAL_TABLET | ORAL | Status: DC | PRN

## 2021-11-12 MED ORDER — LIDOCAINE HCL (PF) 1 % IJ SOLN
INTRAMUSCULAR | Status: AC
  Filled 2021-11-12: qty 30

## 2021-11-12 MED ORDER — ACETAMINOPHEN 500 MG PO TABS
1000.0000 mg | ORAL_TABLET | Freq: Once | ORAL | Status: AC
  Administered 2021-11-12: 1000 mg via ORAL
  Filled 2021-11-12: qty 2

## 2021-11-12 MED ORDER — FENTANYL CITRATE (PF) 250 MCG/5ML IJ SOLN
INTRAMUSCULAR | Status: AC
  Filled 2021-11-12: qty 5

## 2021-11-12 MED ORDER — GABAPENTIN 600 MG PO TABS
600.0000 mg | ORAL_TABLET | Freq: Two times a day (BID) | ORAL | Status: DC
  Administered 2021-11-12 – 2021-11-13 (×2): 600 mg via ORAL
  Filled 2021-11-12 (×2): qty 1

## 2021-11-12 MED ORDER — HEPARIN 6000 UNIT IRRIGATION SOLUTION
Status: AC
  Filled 2021-11-12: qty 1500

## 2021-11-12 MED ORDER — CHLORHEXIDINE GLUCONATE 0.12 % MT SOLN
OROMUCOSAL | Status: AC
  Administered 2021-11-12: 15 mL via OROMUCOSAL
  Filled 2021-11-12: qty 15

## 2021-11-12 MED ORDER — HYDRALAZINE HCL 20 MG/ML IJ SOLN
10.0000 mg | INTRAMUSCULAR | Status: DC | PRN
  Administered 2021-11-12 – 2021-11-13 (×2): 10 mg via INTRAVENOUS
  Filled 2021-11-12 (×2): qty 1

## 2021-11-12 MED ORDER — LIDOCAINE 2% (20 MG/ML) 5 ML SYRINGE
INTRAMUSCULAR | Status: DC | PRN
  Administered 2021-11-12: 40 mg via INTRAVENOUS

## 2021-11-12 MED ORDER — SODIUM CHLORIDE 0.9 % IV SOLN: INTRAVENOUS | Status: DC

## 2021-11-12 MED ORDER — IODIXANOL 320 MG/ML IV SOLN
INTRAVENOUS | Status: DC | PRN
  Administered 2021-11-12: 40 mL

## 2021-11-12 MED ORDER — CHLORHEXIDINE GLUCONATE 4 % EX LIQD: 30.0000 mL | CUTANEOUS | Status: DC

## 2021-11-12 MED ORDER — SODIUM CHLORIDE 0.9 % IV SOLN: INTRAVENOUS | Status: AC

## 2021-11-12 SURGICAL SUPPLY — 65 items
ADH SKN CLS APL DERMABOND .7 (GAUZE/BANDAGES/DRESSINGS) ×4
APL PRP STRL LF DISP 70% ISPRP (MISCELLANEOUS) ×2
BAG COUNTER SPONGE SURGICOUNT (BAG) ×3 IMPLANT
BAG DECANTER FOR FLEXI CONT (MISCELLANEOUS) IMPLANT
BAG SNAP BAND KOVER 36X36 (MISCELLANEOUS) ×1 IMPLANT
BAG SPNG CNTER NS LX DISP (BAG) ×2
BLADE CLIPPER SURG (BLADE) IMPLANT
BLADE STERNUM SYSTEM 6 (BLADE) IMPLANT
CABLE ADAPT CONN TEMP 6FT (ADAPTER) ×3 IMPLANT
CANISTER SUCT 3000ML PPV (MISCELLANEOUS) IMPLANT
CATH DIAG EXPO 6F AL1 (CATHETERS) IMPLANT
CATH DIAG EXPO 6F VENT PIG 145 (CATHETERS) ×6 IMPLANT
CATH HEADHUNTER H1 5F 100CM (CATHETERS) ×1 IMPLANT
CATH INFINITI 6F AL2 (CATHETERS) ×1 IMPLANT
CATH S G BIP PACING (CATHETERS) ×3 IMPLANT
CHLORAPREP W/TINT 26 (MISCELLANEOUS) ×3 IMPLANT
CLOSURE PERCLOSE PROSTYLE (VASCULAR PRODUCTS) ×3 IMPLANT
CNTNR URN SCR LID CUP LEK RST (MISCELLANEOUS) ×4 IMPLANT
CONT SPEC 4OZ STRL OR WHT (MISCELLANEOUS) ×6
COVER BACK TABLE 80X110 HD (DRAPES) IMPLANT
DECANTER SPIKE VIAL GLASS SM (MISCELLANEOUS) ×3 IMPLANT
DERMABOND ADVANCED (GAUZE/BANDAGES/DRESSINGS) ×2
DERMABOND ADVANCED .7 DNX12 (GAUZE/BANDAGES/DRESSINGS) ×2 IMPLANT
DRSG TEGADERM 4X4.75 (GAUZE/BANDAGES/DRESSINGS) ×6 IMPLANT
ELECT REM PT RETURN 9FT ADLT (ELECTROSURGICAL) ×3
ELECTRODE REM PT RTRN 9FT ADLT (ELECTROSURGICAL) ×2 IMPLANT
GAUZE SPONGE 4X4 12PLY STRL (GAUZE/BANDAGES/DRESSINGS) ×3 IMPLANT
GLOVE SURG ENC MOIS LTX SZ7.5 (GLOVE) IMPLANT
GLOVE SURG ENC MOIS LTX SZ8 (GLOVE) IMPLANT
GLOVE SURG ORTHO LTX SZ7.5 (GLOVE) IMPLANT
GOWN STRL REUS W/ TWL LRG LVL3 (GOWN DISPOSABLE) IMPLANT
GOWN STRL REUS W/ TWL XL LVL3 (GOWN DISPOSABLE) ×2 IMPLANT
GOWN STRL REUS W/TWL LRG LVL3 (GOWN DISPOSABLE)
GOWN STRL REUS W/TWL XL LVL3 (GOWN DISPOSABLE) ×6
GUIDEWIRE SAF TJ AMPL .035X180 (WIRE) ×3 IMPLANT
GUIDEWIRE SAFE TJ AMPLATZ EXST (WIRE) ×3 IMPLANT
KIT BASIN OR (CUSTOM PROCEDURE TRAY) ×3 IMPLANT
KIT HEART LEFT (KITS) ×3 IMPLANT
KIT SAPIAN 3 ULTRA RESILIA 23 (Valve) ×1 IMPLANT
KIT TURNOVER KIT B (KITS) ×3 IMPLANT
NS IRRIG 1000ML POUR BTL (IV SOLUTION) ×3 IMPLANT
PACK ENDO MINOR (CUSTOM PROCEDURE TRAY) ×3 IMPLANT
PAD ARMBOARD 7.5X6 YLW CONV (MISCELLANEOUS) ×6 IMPLANT
PAD ELECT DEFIB RADIOL ZOLL (MISCELLANEOUS) ×3 IMPLANT
POSITIONER HEAD DONUT 9IN (MISCELLANEOUS) ×3 IMPLANT
SET MICROPUNCTURE 5F STIFF (MISCELLANEOUS) ×3 IMPLANT
SHEATH BRITE TIP 7FR 35CM (SHEATH) ×3 IMPLANT
SHEATH PINNACLE 6F 10CM (SHEATH) ×3 IMPLANT
SHEATH PINNACLE 8F 10CM (SHEATH) ×3 IMPLANT
SLEEVE REPOSITIONING LENGTH 30 (MISCELLANEOUS) ×3 IMPLANT
SPONGE GAUZE 2X2 8PLY STRL LF (GAUZE/BANDAGES/DRESSINGS) ×2 IMPLANT
STOPCOCK MORSE 400PSI 3WAY (MISCELLANEOUS) ×6 IMPLANT
SUT PROLENE 6 0 C 1 30 (SUTURE) IMPLANT
SUT SILK  1 MH (SUTURE) ×3
SUT SILK 1 MH (SUTURE) ×2 IMPLANT
SYR 50ML LL SCALE MARK (SYRINGE) ×3 IMPLANT
SYR BULB IRRIG 60ML STRL (SYRINGE) IMPLANT
TOWEL GREEN STERILE (TOWEL DISPOSABLE) ×6 IMPLANT
TRANSDUCER W/STOPCOCK (MISCELLANEOUS) ×6 IMPLANT
TRAY FOLEY SLVR 14FR TEMP STAT (SET/KITS/TRAYS/PACK) IMPLANT
TUBE SUCT INTRACARD DLP 20F (MISCELLANEOUS) IMPLANT
WIRE AMPLATZ SS-J .035X180CM (WIRE) ×1 IMPLANT
WIRE EMERALD 3MM-J .035X150CM (WIRE) ×3 IMPLANT
WIRE EMERALD 3MM-J .035X260CM (WIRE) ×3 IMPLANT
WIRE HI TORQ VERSACORE J 260CM (WIRE) ×1 IMPLANT

## 2021-11-12 NOTE — Op Note (Signed)
[facility]   MULTIDISCIPLINARY HEART VALVE TEAM   TAVR OPERATIVE NOTE   Date of Procedure:  11/12/2021  Preoperative Diagnosis: Severe Aortic Stenosis   Postoperative Diagnosis: Same   Procedure:   Transcatheter Aortic Valve Replacement - Percutaneous Left Transfemoral Approach  Edwards Sapien 3 Ultra ResiliaTHV (size 23 mm, model # 9755RSL, serial # [id number])      Anesthesiologist:  Renold Don, MD  Echocardiographer:  Jerilynn Mages. Croitoru, MD  Pre-operative Echo Findings: Severe aortic stenosis Normal left ventricular systolic function  Post-operative Echo Findings: No paravalvular leak Normal left ventricular systolic function   BRIEF CLINICAL NOTE AND INDICATIONS FOR SURGERY  The patient is a 88 year old gentleman with stage D, severe, symptomatic aortic stenosis with New York Heart Association class 3 symptoms of exertional fatigue and shortness of breath consistent with chronic diastolic congestive heart failure. He has also been having some episodes of dizziness but no syncope. I have personally reviewed his 2D echocardiogram, cardiac catheterization, and CTA studies. His echo shows a severely calcified aortic valve with restricted mobility. The mean gradient was only measured at 25 mmHg but there is moderate aortic insufficiency. His valve area calculations were felt to be inaccurate due to the presence of subvalvular obstruction with severe asymmetric septal hypertrophy as well as moderate aortic insufficiency. He also has moderate mitral regurgitation. He was doing well until about 6 months ago and now has progressive exertional symptoms suggesting that worsening aortic stenosis may be a major factor and we may expect significant improvement in his functional status if that is corrected. I think is reasonable to proceed with aortic valve replacement by TAVR. His gated cardiac CTA shows anatomy suitable for TAVR using a 23 mm SAPIEN 3 valve. His abdominal and pelvic  CTA shows adequate pelvic vascular anatomy to allow transfemoral insertion.  The patient and his son were counseled at length regarding treatment alternatives for management of severe symptomatic aortic stenosis. The risks and benefits of surgical intervention has been discussed in detail. Long-term prognosis with medical therapy was discussed. Alternative approaches such as conventional surgical aortic valve replacement, transcatheter aortic valve replacement, and palliative medical therapy were compared and contrasted at length. This discussion was placed in the context of the patient's own specific clinical presentation and past medical history. All of their questions have been addressed.  Following the decision to proceed with transcatheter aortic valve replacement, a discussion was held regarding what types of management strategies would be attempted intraoperatively in the event of life-threatening complications, including whether or not the patient would be considered a candidate for the use of cardiopulmonary bypass and/or conversion to open sternotomy for attempted surgical intervention. I do not think he is a candidate for emergent sternotomy to manage any intraoperative complications at his age of 51. The patient is aware of the fact that transient use of cardiopulmonary bypass may be necessary. The patient has been advised of a variety of complications that might develop including but not limited to risks of death, stroke, paravalvular leak, aortic dissection or other major vascular complications, aortic annulus rupture, device embolization, cardiac rupture or perforation, mitral regurgitation, acute myocardial infarction, arrhythmia, heart block or bradycardia requiring permanent pacemaker placement, congestive heart failure, respiratory failure, renal failure, pneumonia, infection, other late complications related to structural valve deterioration or migration, or other complications that might  ultimately cause a temporary or permanent loss of functional independence or other long term morbidity. The patient provides full informed consent for the procedure as described and all  questions were answered.      DETAILS OF THE OPERATIVE PROCEDURE  PREPARATION:    The patient was brought to the operating room on the above mentioned date and appropriate monitoring was established by the anesthesia team. The patient was placed in the supine position on the operating table.  Intravenous antibiotics were administered. The patient was monitored closely throughout the procedure under conscious sedation.   Baseline transthoracic echocardiogram was performed. The patient's abdomen and both groins were prepped and draped in a sterile manner. A time out procedure was performed.   PERIPHERAL ACCESS:    Using the modified Seldinger technique, femoral arterial and venous access was obtained with placement of 6 Fr sheaths on the right side.  A pigtail diagnostic catheter was passed through the right arterial sheath under fluoroscopic guidance into the aortic root.  A temporary transvenous pacemaker catheter was passed through the right femoral venous sheath under fluoroscopic guidance into the right ventricle.  The pacemaker was tested to ensure stable lead placement and pacemaker capture. Aortic root angiography was performed in order to determine the optimal angiographic angle for valve deployment.   TRANSFEMORAL ACCESS:   Percutaneous transfemoral access and sheath placement was performed using ultrasound guidance.  The left common femoral artery was cannulated using a micropuncture needle and appropriate location was verified using hand injection angiogram.  A pair of Abbott Perclose percutaneous closure devices were placed and a 6 French sheath replaced into the femoral artery.  The patient was heparinized systemically and ACT verified > 250 seconds.    A 14 Fr transfemoral E-sheath was introduced  into the left common femoral artery after progressively dilating over an Amplatz superstiff wire. An AL-2 catheter was used to direct a straight-tip exchange length wire across the native aortic valve into the left ventricle. This was exchanged out for a pigtail catheter and position was confirmed in the LV apex. Simultaneous LV and Ao pressures were recorded.  The pigtail catheter was exchanged for an Amplatz Extra-stiff wire in the LV apex.    BALLOON AORTIC VALVULOPLASTY:   Not performed    TRANSCATHETER HEART VALVE DEPLOYMENT:   An Edwards Sapien 3 Ultra transcatheter heart valve (size 23 mm) was prepared and crimped per manufacturer's guidelines, and the proper orientation of the valve is confirmed on the [employer] delivery system. The valve was advanced through the introducer sheath using normal technique until in an appropriate position in the abdominal aorta beyond the sheath tip. The balloon was then retracted and using the fine-tuning wheel was centered on the valve. The valve was then advanced across the aortic arch using appropriate flexion of the catheter. The valve was carefully positioned across the aortic valve annulus. The Commander catheter was retracted using normal technique. Once final position of the valve has been confirmed by angiographic assessment, the valve is deployed while temporarily holding ventilation and during rapid ventricular pacing to maintain systolic blood pressure < 50 mmHg and pulse pressure < 10 mmHg. The balloon inflation is held for >3 seconds after reaching full deployment volume. Once the balloon has fully deflated the balloon is retracted into the ascending aorta and valve function is assessed using echocardiography. There is felt to be no paravalvular leak and no central aortic insufficiency.  The patient's hemodynamic recovery following valve deployment is good.  The deployment balloon and guidewire are both removed.    PROCEDURE COMPLETION:    The sheath was removed and femoral artery closure performed.  Protamine was administered once femoral  arterial repair was complete. The temporary pacemaker, pigtail catheters and femoral sheaths were removed with manual pressure used for hemostasis.  A Mynx femoral closure device was utilized following removal of the diagnostic sheath in the right femoral artery.  The patient tolerated the procedure well and is transported to the cath lab recovery area in stable condition. There were no immediate intraoperative complications. All sponge instrument and needle counts are verified correct at completion of the operation.   No blood products were administered during the operation.  The patient received a total of 40 mL of intravenous contrast during the procedure.   Gaye Pollack, MD 11/12/2021

## 2021-11-12 NOTE — Anesthesia Postprocedure Evaluation (Signed)
Anesthesia Post Note  Patient: Paul Mays  Procedure(s) Performed: TRANSCATHETER AORTIC VALVE REPLACEMENT, TRANSFEMORAL (Chest) INTRAOPERATIVE TRANSTHORACIC ECHOCARDIOGRAM (Chest) ULTRASOUND GUIDANCE FOR VASCULAR ACCESS (Bilateral: Groin)     Patient location during evaluation: PACU Anesthesia Type: MAC Level of consciousness: awake and alert Pain management: pain level controlled Vital Signs Assessment: post-procedure vital signs reviewed and stable Respiratory status: spontaneous breathing, nonlabored ventilation and respiratory function stable Cardiovascular status: stable and blood pressure returned to baseline Anesthetic complications: no   No notable events documented.  Last Vitals:  Vitals:   11/12/21 0934 11/12/21 1012  BP: (!) 133/46 (!) 133/43  Pulse: (!) 39 (!) 38  Resp:  18  Temp: 37.2 C (!) 36.3 C  SpO2:      Last Pain:  Vitals:   11/12/21 1012  TempSrc: Temporal  PainSc: 0-No pain                 Audry Pili

## 2021-11-12 NOTE — Progress Notes (Signed)
Patient arrived from cath lab.  S/P TVAR.   A&Ox3.  Family notified of room.   Vital signs per protocol. Bilateral groins Level 0.  Continue flat lying until 13:30.  Patient educated

## 2021-11-12 NOTE — Progress Notes (Signed)
Echocardiogram ordered, post TAVR

## 2021-11-12 NOTE — Anesthesia Procedure Notes (Signed)
Procedure Name: MAC Date/Time: 11/12/2021 7:35 AM Performed by: Janace Litten, CRNA Pre-anesthesia Checklist: Patient identified, Emergency Drugs available, Suction available and Patient being monitored Patient Re-evaluated:Patient Re-evaluated prior to induction Oxygen Delivery Method: Simple face mask

## 2021-11-12 NOTE — H&P (Signed)
[address]       [place] [address]             [id number]      Cardiothoracic Surgery Admission History and Physical   PCP is Quay Burow, Claudina Lick, MD  Referring Provider is Sherren Mocha, MD  Primary Cardiologist is Sherren Mocha, MD   Reason for admission: Severe aortic stenosis   HPI:  The patient is a 88 year old gentleman with history of hypertension, hyperlipidemia, coronary artery disease status post PCI of the RCA and left circumflex in 1999, sleep apnea, LVH with septal hypertrophy and LV outflow tract obstruction who presents with a 62-month history of progressive exertional fatigue and shortness of breath. He says that he has been eating well but has lost considerable weight over that period of time. He has had some dizziness but no syncope. He has had lower extremity edema. He denies any chest pain or pressure. An echocardiogram on 09/05/2021 showed a severely calcified and restricted aortic valve with a mean gradient of 25 mmHg. There is moderate aortic insufficiency with a pressure half-time of 621 ms. Left ventricular ejection fraction was 50 to 55% with severe asymmetric left ventricular hypertrophy of the basal septal segment with grade 3 diastolic dysfunction. Right ventricular size is moderately enlarged with moderate reduction in systolic function. PA pressure was severely elevated. The mitral valve was degenerative in appearance with moderate mitral regurgitation. Cardiac catheterization showed severe single-vessel disease with chronic occlusion of the RCA collateralized from the left. There is severe pulmonary hypertension with PA pressure of 78/29 and a mean of 48 mmHg. Pulmonary vascular resistance was 4.6 Wood units. The aortic valve was difficult to cross but had a low mean gradient of 8 mmHg. LVEDP was 25.   He lives independently and is still active.      Past Medical History:  Diagnosis Date   Anxiety    Aortic stenosis    Arthralgia     Arthritis    CAD (coronary artery disease)    Carotid stenosis    Cervicalgia    CLUSTER HEADACHE 02/10/2007   Qualifier: Diagnosis of By: Linna Darner MD, Gwyndolyn Saxon    Colitis    Edema    Gout    Hearing loss    Herpes zoster without mention of complication    Hyperlipidemia    Hypertension    Hyperuricemia    Impacted cerumen    Migraine    Migraine    Murmur    Olecranon bursitis    OSA (obstructive sleep apnea)    Other and unspecified hyperlipidemia    Other malaise and fatigue    Peripheral neuropathy    Polyneuropathy    Pulmonary hypertension ([place])    Unspecified hereditary and idiopathic peripheral neuropathy    Venous insufficiency         Past Surgical History:  Procedure Laterality Date   ANGIOPLASTY     w/ 2 stents in [place]     RIGHT/LEFT HEART CATH AND CORONARY ANGIOGRAPHY N/A 10/02/2021   Procedure: RIGHT/LEFT HEART CATH AND CORONARY ANGIOGRAPHY; Surgeon: Sherren Mocha, MD; Location: [facility]; Service: Cardiovascular; Laterality: N/A;   TONSILLECTOMY          Family History  Problem Relation Age of Onset   Stroke Father    Died, 83   High blood pressure Father    Diabetes Father  Other Mother    Died, 79   Diabetes Brother    Died, 82   Diabetes Sister    Living, 63   Healthy Daughter    Healthy Son    Social History        Socioeconomic History   Marital status: Married    Spouse name: [name]   Number of children: 2   Years of education: college   Highest education level: Not on file  Occupational History   Occupation: retired    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 Patient finished two years of college.    Pt has children.   Caffeine -two cups daily.   Left handed.   Retired Licensed conveyancer                  Social Determinants of Scientist, physiological Strain: Low Risk    Difficulty of Paying Living Expenses: Not hard at all  Food Insecurity: No Food Insecurity   Worried About Charity fundraiser in the Last Year: Never true   Arboriculturist in the Last Year: Never true  Transportation Needs: No Transportation Needs   Lack of Transportation (Medical): No   Lack of Transportation (Non-Medical): No  Physical Activity: Sufficiently Active   Days of Exercise per Week: 5 days   Minutes of Exercise per Session: 30 min  Stress: No Stress Concern Present   Feeling of Stress : Not at all  Social Connections: Socially Integrated   Frequency of Communication with Friends and Family: More than three times a week   Frequency of Social Gatherings with Friends and Family: More than three times a week   Attends Religious Services: More than 4 times per year   Active Member of [employer] or Organizations: Yes   Attends Music therapist: More than 4 times per year   Marital Status: Married  Human resources officer Violence: Not on file          Prior to Admission medications   Medication Sig Start Date End Date Taking? Authorizing Provider  allopurinol (ZYLOPRIM) 300 MG tablet TAKE 1 TABLET(300 MG) BY MOUTH DAILY AS NEEDED FOR GOUT FLARE 08/06/21  Yes Burns, Claudina Lick, MD  aspirin EC 81 MG tablet Take 1 tablet (81 mg total) by mouth daily. 08/29/19  Yes Sande Rives E, PA-C  Cholecalciferol (VITAMIN D3) 50 MCG (2000 UT) capsule Take 2,000 Units by mouth daily.   Yes [provider]  furosemide (LASIX) 20 MG tablet TAKE 1 TABLET(20 MG) BY MOUTH DAILY 10/16/21  Yes Burns, Claudina Lick, MD  gabapentin (NEURONTIN) 600 MG tablet Take  1 tablet (600 mg total) by mouth 2 (two) times daily. 05/16/21  Yes Burns, Claudina Lick, MD  metoprolol tartrate (LOPRESSOR) 25 MG tablet Take 1 tablet (25 mg total) by mouth 2 (two) times daily. 03/08/21  Yes Burns, Claudina Lick, MD  pravastatin (PRAVACHOL) 40 MG tablet Take 40 mg by mouth daily.  Yes [provider]  vitamin B-12 (CYANOCOBALAMIN) 500 MCG tablet Take 500 mcg by mouth daily.  Patient not taking: Reported on 11/06/2021    [provider]         Current Outpatient Medications  Medication Sig Dispense Refill   allopurinol (ZYLOPRIM) 300 MG tablet TAKE 1 TABLET(300 MG) BY MOUTH DAILY AS NEEDED FOR GOUT FLARE 90 tablet 0   aspirin EC 81 MG tablet Take 1 tablet (81 mg total) by mouth daily.     Cholecalciferol (VITAMIN D3) 50 MCG (2000 UT) capsule Take 2,000 Units by mouth daily.     furosemide (LASIX) 20 MG tablet TAKE 1 TABLET(20 MG) BY MOUTH DAILY 90 tablet 1   gabapentin (NEURONTIN) 600 MG tablet Take 1 tablet (600 mg total) by mouth 2 (two) times daily. 180 tablet 1   metoprolol tartrate (LOPRESSOR) 25 MG tablet Take 1 tablet (25 mg total) by mouth 2 (two) times daily. 180 tablet 3   pravastatin (PRAVACHOL) 40 MG tablet Take 40 mg by mouth daily.     vitamin B-12 (CYANOCOBALAMIN) 500 MCG tablet Take 500 mcg by mouth daily. (Patient not taking: Reported on 11/06/2021)     No current facility-administered medications for this visit.        Allergies  Allergen Reactions   Lisinopril Other (See Comments)    Hyperkalemia in the context of combined spironolactone and lisinopril therapy   Spironolactone Other (See Comments)    Hyperkalemia in context of spironolactone and lisinopril therapy   Review of Systems:   General: normal appetite, + decreased energy, no weight gain, + weight loss, no fever  Cardiac: no chest pain with exertion, no chest pain at rest, +SOB with mild exertion, no resting SOB, no PND, no orthopnea, no palpitations, no arrhythmia, no atrial  fibrillation, + LE edema, + dizzy spells, no syncope  Respiratory: + exertional shortness of breath, no home oxygen, no productive cough, no dry cough, no bronchitis, no wheezing, no hemoptysis, no asthma, no pain with inspiration or cough, no sleep apnea, no CPAP at night  GI: no difficulty swallowing, no reflux, no frequent heartburn, no hiatal hernia, no abdominal pain, no constipation, no diarrhea, no hematochezia, no hematemesis, no melena  GU: no dysuria, + frequency, no urinary tract infection, no hematuria, no enlarged prostate, no kidney stones, no kidney disease  Vascular: no pain suggestive of claudication, no pain in feet, no leg cramps, no varicose veins, no DVT, no non-healing foot ulcer  Neuro: no stroke, no TIA's, no seizures, no headaches, no temporary blindness one eye, no slurred speech, + peripheral neuropathy, no chronic pain, no instability of gait, + memory/cognitive dysfunction  Musculoskeletal: + arthritis, + joint swelling, + myalgias, no difficulty walking, normal mobility  Skin: no rash, no itching, no skin infections, no pressure sores or ulcerations  Psych: no anxiety, no depression, no nervousness, no unusual recent stress  Eyes: no blurry vision, no floaters, no recent vision changes, no glasses or contacts  ENT: no hearing loss, + loose or painful teeth, no dentures, last saw dentist 07/2021  Hematologic: + easy bruising, no abnormal bleeding, no clotting disorder, no frequent epistaxis  Endocrine: no diabetes, does not check CBG's at home    Physical Exam:   BP (!) 142/69 (BP Location: Left Arm, Patient Position: Sitting)   Pulse 87   Resp 20   Ht 5\' 6"  (1.676 m)   Wt 146 lb (66.2 kg)   SpO2 92% Comment: RA   BMI  23.57 kg/m  General: Elderly but well-appearing  HEENT: Unremarkable, NCAT, PERLA, EOMI  Neck: no JVD, no bruits, no adenopathy  Chest: clear to auscultation, symmetrical breath sounds, no wheezes, no rhonchi  CV: RRR, 3/6 systolic murmur RSB.   Abdomen: soft, non-tender, no masses  Extremities: warm, well-perfused, pulses palpable at ankle, mild lower extremity edema  Rectal/GU Deferred  Neuro: Grossly non-focal and symmetrical throughout  Skin: Clean and dry, no rashes, no breakdown  Diagnostic Tests:  ECHOCARDIOGRAM REPORT     Patient Name: [name] Date of Exam: 09/05/2021  Medical Rec #: [id number] Height: 66.0 in  Accession #: [id number] Weight: 154.0 lb  Date of Birth: [birth year] BSA: 1.790 m  Patient Age: 75 years BP: 140/80 mmHg  Patient Gender: M HR: 52 bpm.  Exam Location: [place]   Procedure: 2D Echo, 3D Echo, Cardiac Doppler, Color Doppler and Strain  Analysis   Indications: I42.1 Hypertrophic Cardiomyopathy   History: Patient has prior history of Echocardiogram examinations,  most  recent 09/13/2019. Hypertrophic Cardiomyopathy, CAD,  Pulmonary  HTN, Signs/Symptoms:Dyspnea, Edema and Shortness of  Breath; Risk  Factors:Hypertension, Dyslipidemia and Sleep Apnea.   Sonographer: Deliah Boston RDCS  Referring Phys: Claudina Lick BURNS   IMPRESSIONS    1. The aortic valve is severely calcified with restricted movement in  systole. Visually, there is concern for severe aortic stenosis. Vmax 3.4  m/s, MG 25 mmHG. The AVA and DI are overestimated in the setting of  moderate aortic regurgitation. Would  recommend TEE for clarification of AS/AI severity. The aortic valve is  tricuspid. There is severe calcifcation of the aortic valve. There is  severe thickening of the aortic valve. Aortic valve regurgitation is  moderate.  2. There is severe asymmetric hypertrophy of the basal septum consistent  with prior history of hypertrophic cardiomyopathy. No signs of mitral  valve SAM or outflow tract obstruction. Left ventricular ejection  fraction, by estimation, is 50 to 55%. Left  ventricular ejection fraction by 3D volume is 52 %. The left ventricle has  low normal function. The left ventricle has no  regional wall motion  abnormalities. There is severe asymmetric left ventricular hypertrophy of  the basal-septal segment. Left  ventricular diastolic parameters are consistent with Grade III diastolic  dysfunction (restrictive). Elevated left ventricular end-diastolic  pressure. The E/e' is 23.7.  3. Right ventricular systolic function is moderately reduced. The right  ventricular size is moderately enlarged. There is severely elevated  pulmonary artery systolic pressure. The estimated right ventricular  systolic pressure is 85.2 mmHg.  4. Left atrial size was severely dilated.  5. Right atrial size was severely dilated.  6. The mitral valve is degenerative. Moderate mitral valve regurgitation.  7. The inferior vena cava is dilated in size with <50% respiratory  variability, suggesting right atrial pressure of 15 mmHg.   Comparison(s): Changes from prior study are noted. Concerns for severe AS  as detailed above.   FINDINGS  Left Ventricle: There is severe asymmetric hypertrophy of the basal  septum consistent with prior history of hypertrophic cardiomyopathy. No  signs of mitral valve SAM or outflow tract obstruction. Left ventricular  ejection fraction, by estimation, is 50  to 55%. Left ventricular ejection fraction by 3D volume is 52 %. The left  ventricle has low normal function. The left ventricle has no regional wall  motion abnormalities. Global longitudinal strain performed but not  reported based on interpreter  judgement due to suboptimal tracking. The left ventricular internal cavity  size was normal in size. There is severe asymmetric left ventricular  hypertrophy of the basal-septal segment. Left ventricular diastolic  parameters are consistent with Grade III  diastolic dysfunction (restrictive). Elevated left ventricular  end-diastolic pressure. The E/e' is 23.7.   Right Ventricle: The right ventricular size is moderately enlarged. No  increase in right ventricular  wall thickness. Right ventricular systolic  function is moderately reduced. There is severely elevated pulmonary  artery systolic pressure. The tricuspid  regurgitant velocity is 4.21 m/s, and with an assumed right atrial  pressure of 15 mmHg, the estimated right ventricular systolic pressure is  16.1 mmHg.   Left Atrium: Left atrial size was severely dilated.   Right Atrium: Right atrial size was severely dilated.   Pericardium: Trivial pericardial effusion is present.   Mitral Valve: The mitral valve is degenerative in appearance. Moderate  mitral valve regurgitation.   Tricuspid Valve: The tricuspid valve is grossly normal. Tricuspid valve  regurgitation is mild . No evidence of tricuspid stenosis.   Aortic Valve: The aortic valve is severely calcified with restricted  movement in systole. Visually, there is concern for severe aortic  stenosis. Vmax 3.4 m/s, MG 25 mmHG. The AVA and DI are overestimated in  the setting of moderate aortic regurgitation.  Would recommend TEE for clarification of AS/AI severity. The aortic valve  is tricuspid. There is severe calcifcation of the aortic valve. There is  severe thickening of the aortic valve. Aortic valve regurgitation is  moderate. Aortic regurgitation PHT  measures 621 msec. Aortic valve mean gradient measures 25.0 mmHg. Aortic  valve peak gradient measures 45.2 mmHg. Aortic valve area, by VTI measures  1.31 cm.   Pulmonic Valve: The pulmonic valve was grossly normal. Pulmonic valve  regurgitation is mild. No evidence of pulmonic stenosis.   Aorta: The aortic root and ascending aorta are structurally normal, with  no evidence of dilitation.   Venous: The inferior vena cava is dilated in size with less than 50%  respiratory variability, suggesting right atrial pressure of 15 mmHg.   IAS/Shunts: The atrial septum is grossly normal.    LEFT VENTRICLE  PLAX 2D  LVIDd: 4.80 cm Diastology  LVIDs: 2.95 cm LV e' medial: 5.03  cm/s  LV PW: 1.15 cm LV E/e' medial: 23.5  LV IVS: 1.70 cm LV e' lateral: 6.65 cm/s  LVOT diam: 2.10 cm LV E/e' lateral: 17.8  LV SV: 110  LV SV Index: 61 2D  LVOT Area: 3.46 cm Longitudinal  Strain  2D Strain GLS 17.9 %  (A2C):  2D Strain GLS 18.3 %  (A3C):  2D Strain GLS 16.0 %  (A4C):  2D Strain GLS 17.4 %  Avg:   3D Volume EF  LV 3D EF: Left  ventricul  ar  ejection  fraction  by 3D  volume is  52 %.   3D Volume EF:  3D EF: 52 %  LV EDV: 200 ml  LV ESV: 97 ml  LV SV: 103 ml   RIGHT VENTRICLE  RV S prime: 14.40 cm/s  TAPSE (M-mode): 1.8 cm   LEFT ATRIUM Index RIGHT ATRIUM Index  LA diam: 5.65 cm 3.16 cm/m RA Area: 31.80 cm  LA Vol (A2C): 128.0 ml 71.53 ml/m RA Volume: 125.00 ml 69.85 ml/m  LA Vol (A4C): 158.0 ml 88.29 ml/m  LA Biplane Vol: 149.0 ml 83.26 ml/m  AORTIC VALVE  AV Area (Vmax): 1.38 cm  AV Area (Vmean): 1.33 cm  AV Area (VTI): 1.31 cm  AV  Vmax: 336.00 cm/s  AV Vmean: 214.500 cm/s  AV VTI: 0.834 m  AV Peak Grad: 45.2 mmHg  AV Mean Grad: 25.0 mmHg  LVOT Vmax: 134.00 cm/s  LVOT Vmean: 82.125 cm/s  LVOT VTI: 0.316 m  LVOT/AV VTI ratio: 0.38  AI PHT: 621 msec   AORTA  Ao Root diam: 3.50 cm  Ao Asc diam: 3.60 cm   MITRAL VALVE TRICUSPID VALVE  MV Area (PHT): cm TR Peak grad: 70.9 mmHg  MV Decel Time: 161 msec TR Vmax: 421.00 cm/s  MR Peak grad: 160.5 mmHg  MR Mean grad: 110.0 mmHg SHUNTS  MR Vmax: 633.50 cm/s Systemic VTI: 0.32 m  MR Vmean: 497.0 cm/s Systemic Diam: 2.10 cm  MV E velocity: 118.33 cm/s  MV A velocity: 54.70 cm/s  MV E/A ratio: 2.16   Eleonore Chiquito MD  Electronically signed by Eleonore Chiquito MD  Signature Date/Time: 09/06/2021/10:22:52 AM     Final  Physicians  Panel Physicians Referring Physician Case Authorizing Physician  Sherren Mocha, MD (Primary)    Procedures  RIGHT/LEFT HEART CATH AND CORONARY ANGIOGRAPHY  Conclusion  1. Severe single-vessel coronary artery disease with chronic occlusion of  the RCA, collateralized from the left coronary artery.  2. Calcified, restricted aortic valve on plain fluoroscopy, difficult to cross with a straight wire, but low mean transaortic gradient of only 8 mmHg.  3. Severe pulmonary hypertension with pulmonary artery pressure of 78/29, mean 48 mmHg. 4. Transpulmonary gradient 23 mmHg, PVR 4.6 Wood units.  Recommendations: Further valve team review to consider TAVR in this patient with what appears to be severe aortic stenosis by noninvasive assessment, medical therapy for coronary artery disease, consider restrictive cardiomyopathy.  Procedural Details  Technical Details INDICATION: 88 year old functionally independent gentleman with progressive symptoms of aortic stenosis, pulmonary hypertension, and exertional angina. He has a remote history of coronary artery disease status post PCI in the 1990s. He presents today for right and left heart catheterization for hemodynamic assessment and assessment of coronary anatomy.  PROCEDURAL DETAILS: There was an indwelling IV in a left antecubital vein. Attempts were made to change this out but a wire would not pass beyond the tip of the IV. The IV was capped and attention was turned to the right antecubital fossa. Using direct ultrasound guidance, the brachial vein is accessed via a front wall puncture and a 4/5 French slender sheath is placed. The right wrist was then prepped, draped, and anesthetized with 1% lidocaine. Using direct ultrasound guidance a 5/6 French Slender sheath was placed in the right radial artery. Ultrasound images are digitally captured and stored in the patient's chart. Intra-arterial verapamil was administered through the radial artery sheath. IV heparin was administered after a JR4 catheter was advanced into the central aorta. A Swan-Ganz catheter was used for the right heart catheterization. It was difficult to pass through the brachial vein and a venogram is performed. An angled Glidewire was  then used to traverse the venous anatomy into the central circulation. The catheter would not pass because of tortuosity and it is ultimately changed out for a JR4 catheter. I was able to direct the JR4 catheter into the pulmonary artery and do pulmonary artery oxygen saturation and pressure recording. Right heart pullback is then performed. Standard protocol was followed for recording of right heart pressures and sampling of oxygen saturations. Fick cardiac output was calculated. Standard Judkins catheters were used for selective coronary angiography. LV pressure is recorded and an aortic valve pullback is performed. The aortic valve  is difficult to cross. It is ultimately crossed with an AL 2 catheter and a straight tip wire. There were no immediate procedural complications. The patient was transferred to the post catheterization recovery area for further monitoring.     Estimated blood loss <50 mL.   During this procedure medications were administered to achieve and maintain moderate conscious sedation while the patient's heart rate, blood pressure, and oxygen saturation were continuously monitored and I was present face-to-face 100% of this time.  Medications  (Filter: Administrations occurring from 1055 to 1244 on 10/02/21)  important Continuous medications are totaled by the amount administered until 10/02/21 1244.  Heparin (Porcine) in NaCl 1000-0.9 UT/500ML-% SOLN (mL)  Total volume: 1,000 mL  Date/Time Rate/Dose/Volume Action   10/02/21 1111 500 mL Given   1111 500 mL Given   fentaNYL (SUBLIMAZE) injection (mcg)  Total dose: 25 mcg  Date/Time Rate/Dose/Volume Action   10/02/21 1119 25 mcg Given   midazolam (VERSED) injection (mg)  Total dose: 1 mg  Date/Time Rate/Dose/Volume Action   10/02/21 1119 1 mg Given   lidocaine (PF) (XYLOCAINE) 1 % injection (mL)  Total volume: 6 mL  Date/Time Rate/Dose/Volume Action   10/02/21 1120 2 mL Given   1124 2 mL Given   1131 2 mL Given    Radial Cocktail/Verapamil only (mL)  Total volume: 10 mL  Date/Time Rate/Dose/Volume Action   10/02/21 1135 10 mL Given   heparin sodium (porcine) injection (Units)  Total dose: 3,500 Units  Date/Time Rate/Dose/Volume Action   10/02/21 1139 3,500 Units Given   iohexol (OMNIPAQUE) 350 MG/ML injection (mL)  Total volume: 50 mL  Date/Time Rate/Dose/Volume Action   10/02/21 1212 50 mL Given   Sedation Time  Sedation Time Physician-1: 52 minutes 56 seconds  Contrast  Medication Name Total Dose  iohexol (OMNIPAQUE) 350 MG/ML injection 50 mL  Radiation/Fluoro  Fluoro time: 14.1 (min)  DAP: 06237 (mGycm2)  Cumulative Air Kerma: 628 (mGy)  Complications     Complications documented before study signed (10/02/2021 31:51 PM)    No complications were associated with this study.   Documented by Leone Payor D - 10/02/2021 12:22 PM   Coronary Findings  Diagnostic  Dominance: Right  Left Main  There is mild diffuse disease throughout the vessel.  Left Anterior Descending  Prox LAD to Mid LAD lesion is 50% stenosed. The lesion is calcified.  Left Circumflex  There is mild diffuse disease throughout the vessel.  Right Coronary Artery  Prox RCA lesion is 100% stenosed.  Right Posterior Descending Artery  Collaterals  RPDA filled by collaterals from 1st Sept.    First Right Posterolateral Branch  Collaterals  1st RPL filled by collaterals from 2nd Sept.    Intervention   No interventions have been documented.  Right Heart  Right Heart Pressures Hemodynamic findings consistent with severe pulmonary hypertension. Elevated LV EDP consistent with volume overload.  Left Heart  Left Ventricle LV end diastolic pressure is moderately elevated.  Aortic Valve The aortic valve is calcified. There is restricted aortic valve motion.  Coronary Diagrams  Diagnostic  Dominance: Right  Intervention  Implants  No implant documentation for this case.   Syngo Images  Show images for CARDIAC  CATHETERIZATION  Images on Long Term Storage  Show images for [place], Zerkle to Procedure Log    Procedure Log  Hemo Data  Flowsheet Row Most Recent Value  Fick Cardiac Output 5.06 L/min  Fick Cardiac Output Index 2.9 (L/min)/BSA  RA A  Wave 17 mmHg  RA V Wave 19 mmHg  RA Mean 15 mmHg  RV Systolic Pressure 78 mmHg  RV Diastolic Pressure 10 mmHg  RV EDP 15 mmHg  PA Systolic Pressure 78 mmHg  PA Diastolic Pressure 29 mmHg  PA Mean 48 mmHg  AO Systolic Pressure 573 mmHg  AO Diastolic Pressure 68 mmHg  AO Mean 220 mmHg  LV Systolic Pressure 254 mmHg  LV Diastolic Pressure 19 mmHg  LV EDP 25 mmHg  AOp Systolic Pressure 270 mmHg  AOp Diastolic Pressure 70 mmHg  AOp Mean Pressure 623 mmHg  LVp Systolic Pressure 762 mmHg  LVp Diastolic Pressure 15 mmHg  LVp EDP Pressure 24 mmHg  QP/QS 1  TPVR Index 16.57 HRUI  TSVR Index 36.6 HRUI  TPVR/TSVR Ratio 0.45   ADDENDUM REPORT: 10/17/2021 14:39  CLINICAL DATA: Severe Aortic Stenosis.  EXAM:  Cardiac TAVR CT  TECHNIQUE:  A non-contrast, gated CT scan was obtained with axial slices of 3 mm  through the heart for aortic valve calcium scoring. A 110 kV  retrospective, gated, contrast cardiac scan was obtained. Gantry  rotation speed was 250 msecs and collimation was 0.6 mm.  Nitroglycerin was not given. The 3D data set was reconstructed in 5%  intervals of the 0-95% of the R-R cycle. Systolic and diastolic  phases were analyzed on a dedicated workstation using MPR, MIP, and  VRT modes. The patient received 100 cc of contrast.  FINDINGS:  Image quality: Excellent.  Noise artifact is: Limited.  Valve Morphology: The aortic valve is tricuspid with severely  diffusely calcified leaflets. Severely restricted leaflet motion in  systole. There is bulky calcification of the [place].  Aortic Valve Calcium score: 1627  Aortic annular dimension:  Phase assessed: 10%  Annular area: 450 mm2  Annular perimeter: 76.9 mm  Max diameter: 28.3  mm  Min diameter: 20.9 mm  Annular and subannular calcification: Single, mild calcification  under the [place].  Membranous septum length: 9.6 mm  Optimal coplanar projection: LAO 16 CRA 4  Coronary Artery Height above Annulus:  Left Main: 16.7 mm  Right Coronary: 20.7 mm  Sinus of Valsalva Measurements:  Non-coronary: 36.2 mm  Right-coronary: 33.8 mm  Left-coronary: 34.5 mm  Sinus of Valsalva Height:  Non-coronary: 25.1 mm  Right-coronary: 25.7 mm  Left-coronary: 23.7 mm  Sinotubular Junction: 29 mm  Ascending Thoracic Aorta: 32 mm  Coronary Arteries: Normal coronary origin. Right dominance. The  study was performed without use of NTG and is insufficient for  plaque evaluation. Please refer to recent cardiac catheterization  for coronary assessment. 3-vessel coronary calcifications.  Cardiac Morphology:  Right Atrium: Right atrial size is dilated.  Right Ventricle: The right ventricular cavity is dilated.  Left Atrium: Left atrial size is dilated. There is contrast mixing  artifact in the LAA, cannot exclude thrombus.  Left Ventricle: The ventricular cavity size is within normal limits.  There are no stigmata of prior infarction. There is no abnormal  filling defect. Normal left ventricular function, EF=65%. No wall  motion abnormalities.  Pulmonary arteries: Dilated, suggestive of pulmonary hypertension.  No proximal filling defect.  Pulmonary veins: Normal pulmonary venous drainage.  Pericardium: Normal thickness with no significant effusion or  calcium present.  Mitral Valve: The mitral valve is normal structure without  significant calcification.  Extra-cardiac findings: See attached radiology report for  non-cardiac structures.  IMPRESSION:  1. Tricuspid aortic valve with bulky calcification of the NCC.  2. Annular measurements appropriate for 26 mm S3  TAVR (450 mm2).  3. Single, mild calcification under the [place].  4. Sufficient coronary to annulus distance.  5. Optimal  Fluoroscopic Angle for Delivery: LAO 16 CRA 4  6. Dilated pulmonary artery suggestive of pulmonary hypertension.  7. There is contrast mixing artifact in the LAA, cannot exclude  thrombus.  [place] T. Audie Box, MD  Electronically Signed  By: Eleonore Chiquito M.D.  On: 10/17/2021 14:39   Addended by Geralynn Rile, MD on 10/17/2021 2:41 PM  Study Result  Narrative & Impression  EXAM:  OVER-READ INTERPRETATION CT CHEST  The following report is an over-read performed by radiologist Dr.  Vinnie Langton of [facility], [place] on 10/17/2021. This  over-read does not include interpretation of cardiac or coronary  anatomy or pathology. The coronary calcium score/coronary CTA  interpretation by the cardiologist is attached.  COMPARISON: None.  FINDINGS:  Extracardiac findings will be described separately under dictation  for contemporaneously obtained CTA chest, abdomen and pelvis.  IMPRESSION:  Please see separate dictation for contemporaneously obtained CTA  chest, abdomen and pelvis dated 10/17/2021 for full description of  relevant extracardiac findings.  Electronically Signed:  By: Vinnie Langton M.D.  On: 10/17/2021 11:01    Impression:   The patient is a 88 year old gentleman with stage D, severe, symptomatic aortic stenosis with New York Heart Association class 3 symptoms of exertional fatigue and shortness of breath consistent with chronic diastolic congestive heart failure. He has also been having some episodes of dizziness but no syncope. I have personally reviewed his 2D echocardiogram, cardiac catheterization, and CTA studies. His echo shows a severely calcified aortic valve with restricted mobility. The mean gradient was only measured at 25 mmHg but there is moderate aortic insufficiency. His valve area calculations were felt to be inaccurate due to the presence of subvalvular obstruction with severe asymmetric septal hypertrophy as well as moderate aortic insufficiency.  He also has moderate mitral regurgitation. He was doing well until about 6 months ago and now has progressive exertional symptoms suggesting that worsening aortic stenosis may be a major factor and we may expect significant improvement in his functional status if that is corrected. I think is reasonable to proceed with aortic valve replacement by TAVR. His gated cardiac CTA shows anatomy suitable for TAVR using a 23 mm SAPIEN 3 valve. His abdominal and pelvic CTA shows adequate pelvic vascular anatomy to allow transfemoral insertion.  The patient and his son were counseled at length regarding treatment alternatives for management of severe symptomatic aortic stenosis. The risks and benefits of surgical intervention has been discussed in detail. Long-term prognosis with medical therapy was discussed. Alternative approaches such as conventional surgical aortic valve replacement, transcatheter aortic valve replacement, and palliative medical therapy were compared and contrasted at length. This discussion was placed in the context of the patient's own specific clinical presentation and past medical history. All of their questions have been addressed.  Following the decision to proceed with transcatheter aortic valve replacement, a discussion was held regarding what types of management strategies would be attempted intraoperatively in the event of life-threatening complications, including whether or not the patient would be considered a candidate for the use of cardiopulmonary bypass and/or conversion to open sternotomy for attempted surgical intervention. I do not think he is a candidate for emergent sternotomy to manage any intraoperative complications at his age of 70. The patient is aware of the fact that transient use of cardiopulmonary bypass may be necessary. The patient has been advised of a variety of  complications that might develop including but not limited to risks of death, stroke, paravalvular leak,  aortic dissection or other major vascular complications, aortic annulus rupture, device embolization, cardiac rupture or perforation, mitral regurgitation, acute myocardial infarction, arrhythmia, heart block or bradycardia requiring permanent pacemaker placement, congestive heart failure, respiratory failure, renal failure, pneumonia, infection, other late complications related to structural valve deterioration or migration, or other complications that might ultimately cause a temporary or permanent loss of functional independence or other long term morbidity. The patient provides full informed consent for the procedure as described and all questions were answered.   Plan:   Transfemoral TAVR using a SAPIEN 3 valve.  Gaye Pollack, MD

## 2021-11-12 NOTE — Transfer of Care (Signed)
Immediate Anesthesia Transfer of Care Note  Patient: Paul Mays  Procedure(s) Performed: TRANSCATHETER AORTIC VALVE REPLACEMENT, TRANSFEMORAL (Chest) INTRAOPERATIVE TRANSTHORACIC ECHOCARDIOGRAM (Chest) ULTRASOUND GUIDANCE FOR VASCULAR ACCESS (Bilateral: Groin)  Patient Location: PACU and Cath Lab  Anesthesia Type:MAC  Level of Consciousness: drowsy, patient cooperative and responds to stimulation  Airway & Oxygen Therapy: Patient Spontanous Breathing  Post-op Assessment: Report given to RN and Post -op Vital signs reviewed and stable  Post vital signs: Reviewed and stable  Last Vitals:  Vitals Value Taken Time  BP    Temp    Pulse 39 11/12/21 0932  Resp 16 11/12/21 0932  SpO2 96 % 11/12/21 0932  Vitals shown include unvalidated device data.  Last Pain:  Vitals:   11/12/21 0616  TempSrc:   PainSc: 0-No pain         Complications: No notable events documented.

## 2021-11-12 NOTE — Progress Notes (Signed)
BP elevated.  Patient denies any discomfort/symptoms TVAR PA/MD paged.   Notified cardiology.  New orders received, hydralazine 10 mg &  Nitroglycerin parameters adjusted.  Noted effective. Vital sign monitoring

## 2021-11-12 NOTE — Op Note (Signed)
[facility]   MULTIDISCIPLINARY HEART VALVE TEAM   TAVR OPERATIVE NOTE   Date of Procedure:  11/12/2021  Preoperative Diagnosis: Severe Aortic Stenosis   Postoperative Diagnosis: Same   Procedure:   Transcatheter Aortic Valve Replacement - Percutaneous  Transfemoral Approach  Edwards Sapien 3 Resilia THV (size 23 mm, , serial # [id number])   Co-Surgeons:  Gaye Pollack, MD and Sherren Mocha, MD  Anesthesiologist:  Renold Don, MD  Echocardiographer:  Sanda Klein, MD  Pre-operative Echo Findings: Severe aortic stenosis Normal left ventricular systolic function  Post-operative Echo Findings: No paravalvular leak Normal/unchanged left ventricular systolic function  BRIEF CLINICAL NOTE AND INDICATIONS FOR SURGERY  88 year old gentleman with history of coronary artery disease status post remote PCI who has developed progressive symptoms of congestive heart failure and exertional angina, now New York Heart Association functional class III.  He has been diagnosed with severe, paradoxical low-flow low gradient aortic stenosis.  After undergoing multidisciplinary heart team evaluation, he is referred for TAVR via a transfemoral approach.  During the course of the patient's preoperative work up they have been evaluated comprehensively by a multidisciplinary team of specialists coordinated through the [facility] in the [facility].  They have been demonstrated to suffer from symptomatic severe aortic stenosis as noted above. The patient has been counseled extensively as to the relative risks and benefits of all options for the treatment of severe aortic stenosis including long term medical therapy, conventional surgery for aortic valve replacement, and transcatheter aortic valve replacement.  The patient has been independently evaluated in formal cardiac surgical consultation by Dr Cyndia Bent, who deemed the patient  appropriate for TAVR. Based upon review of all of the patient's preoperative diagnostic tests they are felt to be candidate for transcatheter aortic valve replacement using the transfemoral approach as an alternative to conventional surgery.    Following the decision to proceed with transcatheter aortic valve replacement, a discussion has been held regarding what types of management strategies would be attempted intraoperatively in the event of life-threatening complications, including whether or not the patient would be considered a candidate for the use of cardiopulmonary bypass and/or conversion to open sternotomy for attempted surgical intervention.  The patient has been advised of a variety of complications that might develop peculiar to this approach including but not limited to risks of death, stroke, paravalvular leak, aortic dissection or other major vascular complications, aortic annulus rupture, device embolization, cardiac rupture or perforation, acute myocardial infarction, arrhythmia, heart block or bradycardia requiring permanent pacemaker placement, congestive heart failure, respiratory failure, renal failure, pneumonia, infection, other late complications related to structural valve deterioration or migration, or other complications that might ultimately cause a temporary or permanent loss of functional independence or other long term morbidity.  The patient provides full informed consent for the procedure as described and all questions were answered preoperatively.  DETAILS OF THE OPERATIVE PROCEDURE  PREPARATION:   The patient is brought to the operating room on the above mentioned date and central monitoring was established by the anesthesia team including placement of a radial arterial line. The patient is placed in the supine position on the operating table.  Intravenous antibiotics are administered. The patient is monitored closely throughout the procedure under conscious  sedation.  Baseline transthoracic echocardiogram is performed. The patient's chest, abdomen, both groins, and both lower extremities are prepared and draped in a sterile manner. A time out procedure is performed.   PERIPHERAL ACCESS:  Using ultrasound guidance, femoral arterial and venous access is obtained with placement of 6 Fr sheaths on the right side.  [name] images are digitally captured and stored in the patient's chart. A pigtail diagnostic catheter was passed through the femoral arterial sheath under fluoroscopic guidance into the aortic root.  A temporary transvenous pacemaker catheter was passed through the femoral venous sheath under fluoroscopic guidance into the right ventricle.  The pacemaker was tested to ensure stable lead placement and pacemaker capture. Aortic root angiography was performed in order to determine the optimal angiographic angle for valve deployment.  TRANSFEMORAL ACCESS:  A micropuncture technique is used to access the left femoral artery under fluoroscopic and ultrasound guidance.  2 Perclose devices are deployed at 10' and 2' positions to 'PreClose' the femoral artery. An 8 French sheath is placed and then an Amplatz Superstiff wire is advanced through the sheath. This is changed out for a 14 French transfemoral E-Sheath after progressively dilating over the Superstiff wire.  An AL-2 catheter was used to direct a straight-tip exchange length wire across the native aortic valve into the left ventricle. This was exchanged out for a pigtail catheter and position was confirmed in the LV apex. Simultaneous LV and Ao pressures were recorded.  The pigtail catheter was exchanged for an Amplatz Extra-stiff wire in the LV apex.    BALLOON AORTIC VALVULOPLASTY:  Not performed  TRANSCATHETER HEART VALVE DEPLOYMENT:  An Edwards Sapien 3 transcatheter heart valve (size 23 mm) was prepared and crimped per manufacturer's guidelines, and the proper orientation of the valve is confirmed  on the [employer] delivery system. The valve was advanced through the introducer sheath using normal technique until in an appropriate position in the abdominal aorta beyond the sheath tip. The balloon was then retracted and using the fine-tuning wheel was centered on the valve. The valve was then advanced across the aortic arch using appropriate flexion of the catheter. The valve was carefully positioned across the aortic valve annulus. The Commander catheter was retracted using normal technique. Once final position of the valve has been confirmed by angiographic assessment, the valve is deployed while temporarily holding ventilation and during rapid ventricular pacing to maintain systolic blood pressure < 50 mmHg and pulse pressure < 10 mmHg. The balloon inflation is held for >3 seconds after reaching full deployment volume. Once the balloon has fully deflated the balloon is retracted into the ascending aorta and valve function is assessed using echocardiography. The patient's hemodynamic recovery following valve deployment is good.  The deployment balloon and guidewire are both removed. Echo demostrated acceptable post-procedural gradients, stable mitral valve function, and no aortic insufficiency.    PROCEDURE COMPLETION:  The sheath was removed and femoral artery closure is performed using the 2 previously deployed Perclose devices.  Protamine is administered once femoral arterial repair was complete. The site is clear with no evidence of bleeding or hematoma after the sutures are tightened. The temporary pacemaker and pigtail catheters are removed. Mynx closure is used for contralateral femoral arterial hemostasis for the 6 Fr sheath.  The patient tolerated the procedure well and is transported to the recovery area in stable condition. There were no immediate intraoperative complications. All sponge instrument and needle counts are verified correct at completion of the operation.   The patient  received a total of 40 mL of intravenous contrast during the procedure.   Sherren Mocha, MD 11/12/2021 1:14 PM

## 2021-11-12 NOTE — Anesthesia Procedure Notes (Signed)
Arterial Line Insertion Start/End2/04/2022 7:10 AM Performed by: Janace Litten, CRNA, CRNA  Patient location: Pre-op. Preanesthetic checklist: patient identified, IV checked, surgical consent, monitors and equipment checked and pre-op evaluation Lidocaine 1% used for infiltration Right, radial was placed Hand hygiene performed  and maximum sterile barriers used   Attempts: 1 Procedure performed without using ultrasound guided technique. Following insertion, dressing applied and Biopatch. Post procedure assessment: normal  Patient tolerated the procedure well with no immediate complications.

## 2021-11-12 NOTE — Progress Notes (Signed)
°  Echocardiogram 2D Echocardiogram has been performed.  Paul Mays 11/12/2021, 9:01 AM

## 2021-11-12 NOTE — Progress Notes (Signed)
Mobility Specialist Criteria Algorithm Info.   11/12/21 1657  Mobility  Activity Ambulated with assistance in hallway;Transferred from bed to chair  Range of Motion/Exercises Active;All extremities  Level of Assistance Contact guard assist, steadying assist  Assistive Device None (IV pole)  Distance Ambulated (ft) 390 ft  Activity Response Tolerated well   Patient received in bed pleasant and eager to participate. Tolerated ambulation well without complaint or incident. Was left in recliner chair with all needs met, call bell in reach. RN present.  11/12/2021 4:58 PM  Paul Mays, [place], [place]  PBAQV:[phone] Office: [phone]

## 2021-11-12 NOTE — Progress Notes (Signed)
°  [place] VALVE TEAM  Patient doing well s/p TAVR. He is hemodynamically stable. Groin sites stable. ECG with SB with rates in the 30's however no high grade block. He is asymptomatic. Plan to DC arterial line and transfer to 4E. Plan for early ambulation after bedrest completed and hopeful discharge over the next 24-48 hours.   Kathyrn Drown NP-C Structural Heart Team  Pager: [phone] Phone: [phone]

## 2021-11-12 NOTE — Addendum Note (Signed)
Addendum  created 11/12/21 1146 by Janace Litten, CRNA   Charge Capture section accepted, Visit diagnoses modified

## 2021-11-13 ENCOUNTER — Ambulatory Visit: Payer: Medicare Other | Admitting: Physician Assistant

## 2021-11-13 ENCOUNTER — Encounter (HOSPITAL_COMMUNITY): Payer: Self-pay | Admitting: Cardiovascular Disease

## 2021-11-13 ENCOUNTER — Other Ambulatory Visit: Payer: Self-pay

## 2021-11-13 ENCOUNTER — Inpatient Hospital Stay (HOSPITAL_COMMUNITY): Payer: Medicare Other

## 2021-11-13 DIAGNOSIS — I350 Nonrheumatic aortic (valve) stenosis: Principal | ICD-10-CM

## 2021-11-13 DIAGNOSIS — Z952 Presence of prosthetic heart valve: Secondary | ICD-10-CM | POA: Diagnosis not present

## 2021-11-13 DIAGNOSIS — Z006 Encounter for examination for normal comparison and control in clinical research program: Secondary | ICD-10-CM | POA: Diagnosis not present

## 2021-11-13 DIAGNOSIS — I422 Other hypertrophic cardiomyopathy: Secondary | ICD-10-CM | POA: Diagnosis not present

## 2021-11-13 DIAGNOSIS — I5033 Acute on chronic diastolic (congestive) heart failure: Secondary | ICD-10-CM | POA: Diagnosis not present

## 2021-11-13 LAB — ECHOCARDIOGRAM COMPLETE
AR max vel: 1.71 cm2
AV Area VTI: 1.82 cm2
AV Area mean vel: 1.64 cm2
AV Mean grad: 23 mmHg
AV Peak grad: 41.6 mmHg
Ao pk vel: 3.22 m/s
Area-P 1/2: 3.91 cm2
Calc EF: 57.7 %
Height: 66 in
S' Lateral: 2.7 cm
Single Plane A2C EF: 61.3 %
Single Plane A4C EF: 54 %
Weight: 2329.6 oz

## 2021-11-13 LAB — ECHOCARDIOGRAM LIMITED
AR max vel: 2.21 cm2
AV Area VTI: 2.57 cm2
AV Area mean vel: 2.38 cm2
AV Mean grad: 4 mmHg
AV Peak grad: 11.8 mmHg
Ao pk vel: 1.72 m/s
Calc EF: 63.2 %
MV M vel: 5.14 m/s
MV Peak grad: 105.7 mmHg
P 1/2 time: 570 msec
Radius: 1 cm
Single Plane A2C EF: 65.1 %
Single Plane A4C EF: 57.8 %

## 2021-11-13 LAB — BASIC METABOLIC PANEL
Anion gap: 10 (ref 5–15)
BUN: 30 mg/dL — ABNORMAL HIGH (ref 8–23)
CO2: 25 mmol/L (ref 22–32)
Calcium: 8.8 mg/dL — ABNORMAL LOW (ref 8.9–10.3)
Chloride: 108 mmol/L (ref 98–111)
Creatinine, Ser: 1.32 mg/dL — ABNORMAL HIGH (ref 0.61–1.24)
GFR, Estimated: 51 mL/min — ABNORMAL LOW (ref >60)
Glucose, Bld: 86 mg/dL (ref 70–99)
Potassium: 3.8 mmol/L (ref 3.5–5.1)
Sodium: 143 mmol/L (ref 135–145)

## 2021-11-13 LAB — CBC
HCT: 34.7 % — ABNORMAL LOW (ref 39.0–52.0)
Hemoglobin: 11.4 g/dL — ABNORMAL LOW (ref 13.0–17.0)
MCH: 32.3 pg (ref 26.0–34.0)
MCHC: 32.9 g/dL (ref 30.0–36.0)
MCV: 98.3 fL (ref 80.0–100.0)
Platelets: 80 10*3/uL — ABNORMAL LOW (ref 150–400)
RBC: 3.53 MIL/uL — ABNORMAL LOW (ref 4.22–5.81)
RDW: 16.1 % — ABNORMAL HIGH (ref 11.5–15.5)
WBC: 5.5 10*3/uL (ref 4.0–10.5)
nRBC: 0 % (ref 0.0–0.2)

## 2021-11-13 LAB — ABO/RH: ABO/RH(D): A NEG

## 2021-11-13 MED ORDER — AMLODIPINE BESYLATE 5 MG PO TABS: 5.0000 mg | ORAL_TABLET | Freq: Every day | ORAL | 2 refills | Status: DC

## 2021-11-13 MED ORDER — HYDRALAZINE HCL 25 MG PO TABS: 25.0000 mg | ORAL_TABLET | Freq: Three times a day (TID) | ORAL | Status: DC

## 2021-11-13 MED ORDER — AMLODIPINE BESYLATE 5 MG PO TABS
5.0000 mg | ORAL_TABLET | Freq: Every day | ORAL | Status: DC
  Administered 2021-11-13: 5 mg via ORAL
  Filled 2021-11-13: qty 1

## 2021-11-13 MED ORDER — METOPROLOL TARTRATE 25 MG PO TABS
25.0000 mg | ORAL_TABLET | Freq: Two times a day (BID) | ORAL | Status: DC
  Administered 2021-11-13: 25 mg via ORAL
  Filled 2021-11-13: qty 1

## 2021-11-13 MED FILL — Potassium Chloride Inj 2 mEq/ML: INTRAVENOUS | Qty: 40 | Status: AC

## 2021-11-13 MED FILL — Heparin Sodium (Porcine) Inj 1000 Unit/ML: Qty: 1000 | Status: AC

## 2021-11-13 MED FILL — Magnesium Sulfate Inj 50%: INTRAMUSCULAR | Qty: 10 | Status: AC

## 2021-11-13 NOTE — Progress Notes (Signed)
These are standard pre op labs

## 2021-11-13 NOTE — Progress Notes (Signed)
Mobility Specialist Progress Note   11/13/21 1115  Mobility  Activity Ambulated independently in hallway  Level of Assistance Standby assist, set-up cues, supervision of patient - no hands on  Assistive Device None  Distance Ambulated (ft) 160 ft  Activity Response Tolerated well  $Mobility charge 1 Mobility   Received pt in chair having no complaints and agreeable to mobility. BP slightly elevated before session but asymptomatic throughout ambulation. Returned back to chair and checked BP which had elevated again but pt having no complaints. Left w/ call bell in reach and all needs met and RN notified.  Pre Mobility: 60 HR, 165/57 BP, 96% SpO2 Post Mobility: 66 HR, 179/71 BP, 97% SpO2  [place] Falling Mobility Specialist Phone Number [phone]

## 2021-11-13 NOTE — Progress Notes (Signed)
Discharge: Medication list and d/c summary reviewed with son & patient.   IV access discontinued.   Assisted to private vehicle via wheel chair w/o difficulty.

## 2021-11-13 NOTE — Discharge Summary (Addendum)
[place] VALVE TEAM  Discharge Summary    Patient ID: Paul Mays MRN: [id number]; DOB: 19-Nov-1927  Admit date: 11/12/2021 Discharge date: 11/13/2021  Primary Care Provider: Binnie Rail, MD  Primary Cardiologist: Sherren Mocha, MD /Dr. Cyndia Bent, MD (TAVR)   Discharge Diagnoses    Principal Problem:   S/P TAVR (transcatheter aortic valve replacement) Active Problems:   Hyperlipidemia   Essential hypertension   Severe aortic stenosis   Hypertrophic cardiomyopathy ([place])   Acute on chronic diastolic heart failure (HCC)  Allergies Allergies  Allergen Reactions   Lisinopril Other (See Comments)    Hyperkalemia in the context of combined spironolactone and lisinopril therapy   Spironolactone Other (See Comments)    Hyperkalemia in context of spironolactone and lisinopril therapy   Diagnostic Studies/Procedures    TAVR OPERATIVE NOTE     Date of Procedure:                11/12/2021   Preoperative Diagnosis:      Severe Aortic Stenosis    Postoperative Diagnosis:    Same    Procedure:        Transcatheter Aortic Valve Replacement - Percutaneous Left Transfemoral Approach             Edwards Sapien 3 Ultra ResiliaTHV (size 23 mm, model # 9755RSL, serial # [id number])                Anesthesiologist:                  Renold Don, MD   Echocardiographer:              Bertrum [name], MD   Pre-operative Echo Findings: Severe aortic stenosis Normal left ventricular systolic function   Post-operative Echo Findings: No paravalvular leak Normal left ventricular systolic function  ___________   Echo 11/13/21: Completed but pending formal read at the time of discharge   History of Present Illness     Paul Mays is a 88 y.o. male with a history of CAD remote PCI of the RCA and left circumflex in 1999, left ventricular hypertrophy with septal hypertrophy and LV outflow tract obstruction, possible amyloid but decided to not pursue  workup given advanced age. Also a hx of HTN, HLD, sleep apnea, peripheral neuropathy, pulmonary hypertension and severe LFLG AS with moderate AI who was seen for the evaluation of progressive DOE and fatigue over the last 6 months. A recent echocardiogram was suggestive of severe aortic stenosis. He was then referred to Dr. Burt Knack for the discussion of diagnostic and treatment options.  He was ultimately set up for further workup for TAVR with [place] and CT imaging. This showed severe single-vessel CAD with chronic occlusion of the RCA, collateralized from the left coronary artery. Medical Tx for CAD. Calcified, restricted aortic valve on plain fluoroscopy, difficult to cross with a straight wire, but low mean transaortic gradient of only 8 mmHg. Severe pulmonary hypertension with pulmonary artery pressure of 78/29, mean 48 mmHg.  Transpulmonary gradient 23 mmHg, PVR 4.6 [place] units.   He was evaluated by the multidisciplinary valve team, including Dr. Cyndia Bent and felt to have severe, symptomatic aortic stenosis and to be a suitable candidate for TAVR, which was set up for 11/12/21.     Hospital Course     Severe AS: s/p successful TAVR with a 23 mm Edwards Sapien 3 Ultra Resilia via the TF approach on 11/12/21. Post operative echo pending. Groin  sites are stable. ECG with NSR, 1st degree AV block however no high grade heart block. Continue ASA 81mg  PO QD monotherapy. He was quite hypertensive the evening after his procedure and was placed on IV NTG. Echo findings suggestive this is likely a chronic issues and was under treated. He was restarted on home Metoprolol and low dose Amlodipine was added to his regimen. He is ok to discharge id SBP <180. He has been asymptomatic. Last BP 146/62. He has ambulated with CRI and was educated on post procedure site care and instructions. Does not need SBE, has full dentures. Will follow up with our team next week.   Hypertension: See above. Was elevated in the post procedure  setting requiring IV NTG and IV hydralazine for better control. His home beta blocker was initially held as he was bradycardic. This was restarted on day of discharge given HR stabilization. Amlodipine was added to his regimen. May need further titration. Would avoid ACE/ARB as he has had issues with hyperkalemia with this class of medications.   Acute on chronic diastolic CHF: Pre-operative lab work with and elevated BNP. Lung exam with mild LL crackles. On PTA Lasix. Given one dose IV Lasix with good UO. Will resume home dose Lasix today   CAD: s/p remote PCI of the RCA and left circumflex in 1999. No anginal symptoms. Will continue with medical therapy for CAD.   Left ventricular hypertrophy with septal hypertrophy and LV outflow tract obstruction: Felt to possibly have amyloid however but decided to not pursue workup given advanced age.   HLD: Last LDL, 65 from 12/2017. Will need follow up lipid panel  Consultants: None   The patient was seen and examined by Dr. Burt Knack who feels that the patient is stable and ready for discharge today, 11/13/21.  _____________  Discharge Vitals Blood pressure (!) 146/62, pulse 75, temperature 98.4 F (36.9 C), temperature source Oral, resp. rate 17, height 5\' 6"  (1.676 m), weight 66 kg, SpO2 96 %.  Filed Weights   11/12/21 0605 11/13/21 0500  Weight: 68 kg 66 kg   General: Elderly, well nourished, NAD Neck: Negative for carotid bruits. No JVD Lungs:Clear to ausculation bilaterally. Breathing is unlabored. Cardiovascular: RRR with S1 S2. Soft flow murmur Extremities: No edema. Neuro: Alert and oriented. No focal deficits. No facial asymmetry. MAE spontaneously. Psych: Responds to questions appropriately with normal affect.    Labs & Radiologic Studies    CBC Recent Labs    11/12/21 0950 11/13/21 0320  WBC  --  5.5  HGB 11.6* 11.4*  HCT 34.0* 34.7*  MCV  --  98.3  PLT  --  80*   Basic Metabolic Panel Recent Labs    11/12/21 0950  11/13/21 0320  NA 142 143  K 4.6 3.8  CL 111 108  CO2  --  25  GLUCOSE 97 86  BUN 46* 30*  CREATININE 1.10 1.32*  CALCIUM  --  8.8*   Liver Function Tests No results for input(s): AST, ALT, ALKPHOS, BILITOT, PROT, ALBUMIN in the last 72 hours. No results for input(s): LIPASE, AMYLASE in the last 72 hours. Cardiac Enzymes No results for input(s): CKTOTAL, CKMB, CKMBINDEX, TROPONINI in the last 72 hours. BNP Invalid input(s): POCBNP D-Dimer No results for input(s): DDIMER in the last 72 hours. Hemoglobin A1C No results for input(s): HGBA1C in the last 72 hours. Fasting Lipid Panel No results for input(s): CHOL, HDL, LDLCALC, TRIG, CHOLHDL, LDLDIRECT in the last 72 hours. Thyroid Function Tests No  results for input(s): TSH, T4TOTAL, T3FREE, THYROIDAB in the last 72 hours.  Invalid input(s): FREET3 _____________  DG Chest 2 View  Result Date: 11/10/2021 CLINICAL DATA:  Preoperative study prior to TAVR. EXAM: CHEST - 2 VIEW COMPARISON:  August 05, 2021. FINDINGS: Small bilateral pleural effusions with underlying atelectasis are identified. No pneumothorax. No nodules or masses. No focal infiltrates. Stable mild cardiomegaly. The hila and mediastinum are unchanged. IMPRESSION: Small bilateral pleural effusions with underlying atelectasis, stable. No other interval changes. Electronically Signed   By: Dorise Bullion III M.D.   On: 11/10/2021 10:38   CT CORONARY MORPH W/CTA COR W/SCORE W/CA W/CM &/OR WO/CM  Addendum Date: 10/17/2021   ADDENDUM REPORT: 10/17/2021 14:39 CLINICAL DATA:  Severe Aortic Stenosis. EXAM: Cardiac TAVR CT TECHNIQUE: A non-contrast, gated CT scan was obtained with axial slices of 3 mm through the heart for aortic valve calcium scoring. A 110 kV retrospective, gated, contrast cardiac scan was obtained. Gantry rotation speed was 250 msecs and collimation was 0.6 mm. Nitroglycerin was not given. The 3D data set was reconstructed in 5% intervals of the 0-95% of the R-R  cycle. Systolic and diastolic phases were analyzed on a dedicated workstation using MPR, MIP, and VRT modes. The patient received 100 cc of contrast. FINDINGS: Image quality: Excellent. Noise artifact is: Limited. Valve Morphology: The aortic valve is tricuspid with severely diffusely calcified leaflets. Severely restricted leaflet motion in systole. There is bulky calcification of the [place]. Aortic Valve Calcium score: 1627 Aortic annular dimension: Phase assessed: 10% Annular area: 450 mm2 Annular perimeter: 76.9 mm Max diameter: 28.3 mm Min diameter: 20.9 mm Annular and subannular calcification: Single, mild calcification under the [place]. Membranous septum length: 9.6 mm Optimal coplanar projection: LAO 16 CRA 4 Coronary Artery Height above Annulus: Left Main: 16.7 mm Right Coronary: 20.7 mm Sinus of Valsalva Measurements: Non-coronary: 36.2 mm Right-coronary: 33.8 mm Left-coronary: 34.5 mm Sinus of Valsalva Height: Non-coronary: 25.1 mm Right-coronary: 25.7 mm Left-coronary: 23.7 mm Sinotubular Junction: 29 mm Ascending Thoracic Aorta: 32 mm Coronary Arteries: Normal coronary origin. Right dominance. The study was performed without use of NTG and is insufficient for plaque evaluation. Please refer to recent cardiac catheterization for coronary assessment. 3-vessel coronary calcifications. Cardiac Morphology: Right Atrium: Right atrial size is dilated. Right Ventricle: The right ventricular cavity is dilated. Left Atrium: Left atrial size is dilated. There is contrast mixing artifact in the LAA, cannot exclude thrombus. Left Ventricle: The ventricular cavity size is within normal limits. There are no stigmata of prior infarction. There is no abnormal filling defect. Normal left ventricular function, EF=65%. No wall motion abnormalities. Pulmonary arteries: Dilated, suggestive of pulmonary hypertension. No proximal filling defect. Pulmonary veins: Normal pulmonary venous drainage. Pericardium: Normal thickness with no  significant effusion or calcium present. Mitral Valve: The mitral valve is normal structure without significant calcification. Extra-cardiac findings: See attached radiology report for non-cardiac structures. IMPRESSION: 1. Tricuspid aortic valve with bulky calcification of the NCC. 2. Annular measurements appropriate for 26 mm S3 TAVR (450 mm2). 3. Single, mild calcification under the [place]. 4. Sufficient coronary to annulus distance. 5. Optimal Fluoroscopic Angle for Delivery: LAO 16 CRA 4 6. Dilated pulmonary artery suggestive of pulmonary hypertension. 7. There is contrast mixing artifact in the LAA, cannot exclude thrombus. [place] T. Audie Box, MD Electronically Signed   By: Eleonore Chiquito M.D.   On: 10/17/2021 14:39   Result Date: 10/17/2021 EXAM: OVER-READ INTERPRETATION  CT CHEST The following report is an over-read performed by radiologist  Dr. Vinnie Langton of [facility], [place] on 10/17/2021. This over-read does not include interpretation of cardiac or coronary anatomy or pathology. The coronary calcium score/coronary CTA interpretation by the cardiologist is attached. COMPARISON:  None. FINDINGS: Extracardiac findings will be described separately under dictation for contemporaneously obtained CTA chest, abdomen and pelvis. IMPRESSION: Please see separate dictation for contemporaneously obtained CTA chest, abdomen and pelvis dated 10/17/2021 for full description of relevant extracardiac findings. Electronically Signed: By: Vinnie Langton M.D. On: 10/17/2021 11:01   CT ANGIO CHEST AORTA W/CM & OR WO/CM  Result Date: 10/17/2021 CLINICAL DATA:  88 year old male with history of severe aortic stenosis. Preprocedural study prior to potential transcatheter aortic valve replacement (TAVR). EXAM: CT ANGIOGRAPHY CHEST, ABDOMEN AND PELVIS TECHNIQUE: Multidetector CT imaging through the chest, abdomen and pelvis was performed using the standard protocol during bolus administration of intravenous contrast.  Multiplanar reconstructed images and MIPs were obtained and reviewed to evaluate the vascular anatomy. RADIATION DOSE REDUCTION: This exam was performed according to the departmental dose-optimization program which includes automated exposure control, adjustment of the mA and/or kV according to patient size and/or use of iterative reconstruction technique. CONTRAST:  38mL OMNIPAQUE IOHEXOL 350 MG/ML SOLN COMPARISON:  No prior chest CT. CT the abdomen and pelvis 12/31/2020. FINDINGS: CTA CHEST FINDINGS Cardiovascular: Heart size is enlarged. Trace amount of pericardial fluid and/or thickening, unlikely to be of any hemodynamic significance at this time. No pericardial calcification. There is aortic atherosclerosis, as well as atherosclerosis of the great vessels of the mediastinum and the coronary arteries, including calcified atherosclerotic plaque in the left main, left anterior descending, left circumflex and right coronary arteries. Thickening calcification of the aortic valve. Mediastinum/Lymph Nodes: No pathologically enlarged mediastinal or hilar lymph nodes. Esophagus is unremarkable in appearance. No axillary lymphadenopathy. Lungs/Pleura: Moderate bilateral pleural effusions lying dependently. Some associated passive subsegmental atelectasis in the lungs dependently. No acute consolidative airspace disease. No suspicious appearing pulmonary nodules or masses are noted. Musculoskeletal/Soft Tissues: There are no aggressive appearing lytic or blastic lesions noted in the visualized portions of the skeleton. CTA ABDOMEN AND PELVIS FINDINGS Hepatobiliary: 2.5 x 1.6 cm low-attenuation lesion in segment 2 of the liver, compatible with a simple cyst. No other suspicious hepatic lesions. No intra or extrahepatic biliary ductal dilatation. Tiny partially calcified gallstone measuring 8 mm in the neck of the gallbladder. Gallbladder is only moderately distended, with no gallbladder wall thickening or surrounding  pericholecystic fluid. Pancreas: No pancreatic mass. No pancreatic ductal dilatation. No pancreatic or peripancreatic fluid collections or inflammatory changes. Spleen: Unremarkable. Adrenals/Urinary Tract: Low-attenuation lesions in the right kidney measuring up to 2.5 cm in the anterior aspect of the interpolar region, compatible with simple cysts. Multiple other subcentimeter low-attenuation lesions in both kidneys, too small to characterize, but statistically likely to represent tiny cysts. Mild multifocal cortical thinning in both kidneys. Bilateral adrenal glands are normal in appearance. No hydroureteronephrosis. Urinary bladder is unremarkable in appearance. Stomach/Bowel: The appearance of the stomach is normal. No pathologic dilatation of small bowel or colon. A few scattered colonic diverticulae are noted, without definite focal surrounding inflammatory changes to clearly indicate an acute diverticulitis. The appendix is not confidently identified and may be surgically absent. Regardless, there are no inflammatory changes noted adjacent to the cecum to suggest the presence of an acute appendicitis at this time. Vascular/Lymphatic: Aortic atherosclerosis, with vascular findings and measurements pertinent to potential TAVR procedure, as detailed below. Aneurysmal dilatation of the right common iliac artery measuring 1.5 cm in  diameter. No lymphadenopathy noted in the abdomen or pelvis. Reproductive: Brachytherapy implants throughout the prostate gland and adjacent soft tissues. Other: Small volume of ascites.  No pneumoperitoneum. Musculoskeletal: There are no aggressive appearing lytic or blastic lesions noted in the visualized portions of the skeleton. VASCULAR MEASUREMENTS PERTINENT TO TAVR: AORTA: Minimal Aortic Diameter-12 x 8 mm Severity of Aortic Calcification-severe RIGHT PELVIS: Right Common Iliac Artery - Minimal Diameter-10.2 x 8.5 mm Tortuosity-moderate to severe Calcification-moderate Right  External Iliac Artery - Minimal Diameter-8.1 x 7.4 mm Tortuosity-moderate Calcification-mild Right Common Femoral Artery - Minimal Diameter-6.6 x 4.5 mm Tortuosity-mild Calcification-severe LEFT PELVIS: Left Common Iliac Artery - Minimal Diameter-8.2 x 8.6 mm Tortuosity-mild Calcification-moderate Left External Iliac Artery - Minimal Diameter-7.8 x 8.2 mm Tortuosity-moderate Calcification-mild Left Common Femoral Artery - Minimal Diameter-7.9 x 7.3 mm Tortuosity-mild Calcification-moderate to severe Review of the MIP images confirms the above findings. IMPRESSION: 1. Vascular findings and measurements pertinent to potential TAVR procedure, as detailed above. 2. Severe thickening and calcification of the aortic valve, compatible with reported clinical history of severe aortic stenosis. 3. Aortic atherosclerosis, in addition to left main and three-vessel coronary artery disease. 4. Moderate bilateral pleural effusions lying dependently with some passive subsegmental atelectasis in the lower lobes of the lungs bilaterally. 5. Cholelithiasis without evidence of acute cholecystitis at this time. 6. Colonic diverticulosis without evidence of acute diverticulitis. 7. Additional incidental findings, as above. Electronically Signed   By: Vinnie Langton M.D.   On: 10/17/2021 12:08   ECHOCARDIOGRAM LIMITED  Result Date: 11/12/2021    ECHOCARDIOGRAM LIMITED REPORT   Patient Name:   Paul Mays Date of Exam: 11/12/2021 Medical Rec #:  [id number]      Height:       66.0 in Accession #:    [id number]     Weight:       150.0 lb Date of Birth:  Sep 11, 1928       BSA:          1.770 m Patient Age:    59 years       BP:           147/70 mmHg Patient Gender: M              HR:           46 bpm. Exam Location:  Inpatient Procedure: Cardiac Doppler, Color Doppler and Limited Echo Indications:     I35.2 Nonrheumatic aortic (valve) stenosis with insufficiency  History:         Patient has prior history of Echocardiogram examinations,  most                  recent 09/05/2021. CHF, Aortic Valve Disease;                  Signs/Symptoms:Dyspnea and Shortness of Breath. Severe aortic                  stenosis.  Sonographer:     Roseanna Rainbow RDCS Referring Phys:  [id number] [place] Diagnosing Phys: Sanda Klein MD  Sonographer Comments: TAVR procedure using 74mm Edwards Sapien valve.  PREPROCEDURE FINDINGS Normal left ventricular systolic function (LVEF 76-16%) and normal regional wall motion. Trileaflet aortic valve with moderate-to-severe calcific aortic stenosis. Peak gradient 44 mm Hg, mean gradient 22 mm Hg, dimensionless index , acceleration time 110 ms, calculated AV area 1.16 cm sq (0.66 cm sq/ m sq BSA). Mild aortic insufficiency. Mild-moderate mitral insufficiency with a central jet. No  pericardial effusion. POSTPROCEDURE FINDINGS Normal left ventricular systolic function (LVEF 81-85%) and normal regional wall motion. Well-seated 23 mm S3U TAVR stent valve. Trivial perivalvular leaks are see. Peak gradient 12 mm Hg, mean gradient 4 mm Hg, dimensionless index 0.72, acceleration time 81 ms, calculated AV area 2.26 cm sq (1.28 cm sq/ m sq BSA) No central aortic insufficiency. There is a trivial (barely visible) perivalvular leak. Mild-moderate mitral insufficiency with a central jet (unchanged). No pericardial effusion. IMPRESSIONS  1. Left ventricular ejection fraction, by estimation, is 55 to 60%. The left ventricle has normal function. The left ventricle has no regional wall motion abnormalities. There is moderate concentric left ventricular hypertrophy.  2. Left atrial size was severely dilated.  3. Mild to moderate mitral valve regurgitation.  4. Tricuspid valve regurgitation is moderate.  5. The aortic valve is tricuspid. There is moderate calcification of the aortic valve. There is moderate thickening of the aortic valve. Aortic valve regurgitation is mild to moderate.  6. There is severely elevated pulmonary artery systolic  pressure. FINDINGS  Left Ventricle: Left ventricular ejection fraction, by estimation, is 55 to 60%. The left ventricle has normal function. The left ventricle has no regional wall motion abnormalities. The left ventricular internal cavity size was normal in size. There is  moderate concentric left ventricular hypertrophy. Right Ventricle: There is severely elevated pulmonary artery systolic pressure. The tricuspid regurgitant velocity is 3.69 m/s, and with an assumed right atrial pressure of 15 mmHg, the estimated right ventricular systolic pressure is 63.1 mmHg. Left Atrium: Left atrial size was severely dilated. Pericardium: Trivial pericardial effusion is present. Mitral Valve: There is mild thickening of the mitral valve leaflet(s). Mild mitral annular calcification. Mild to moderate mitral valve regurgitation, with centrally-directed jet. Tricuspid Valve: The tricuspid valve is normal in structure. Tricuspid valve regurgitation is moderate. Aortic Valve: The aortic valve is tricuspid. There is moderate calcification of the aortic valve. There is moderate thickening of the aortic valve. Aortic valve regurgitation is mild to moderate. Aortic regurgitation PHT measures 570 msec. Aortic valve mean gradient measures 4.0 mmHg. Aortic valve peak gradient measures 11.8 mmHg. Aortic valve area, by VTI measures 2.57 cm. Pulmonic Valve: The pulmonic valve was grossly normal. Pulmonic valve regurgitation is mild to moderate. Aorta: The aortic root and ascending aorta are structurally normal, with no evidence of dilitation. LEFT VENTRICLE PLAX 2D LVOT diam:     2.00 cm LV SV:         90 LV SV Index:   51 LVOT Area:     3.14 cm  LV Volumes (MOD) LV vol d, MOD A2C: 123.0 ml LV vol d, MOD A4C: 133.0 ml LV vol s, MOD A2C: 42.9 ml LV vol s, MOD A4C: 56.1 ml LV SV MOD A2C:     80.1 ml LV SV MOD A4C:     133.0 ml LV SV MOD BP:      88.0 ml AORTIC VALVE AV Area (Vmax):    2.21 cm AV Area (Vmean):   2.38 cm AV Area (VTI):      2.57 cm AV Vmax:           172.00 cm/s AV Vmean:          90.100 cm/s AV VTI:            0.351 m AV Peak Grad:      11.8 mmHg AV Mean Grad:      4.0 mmHg LVOT Vmax:  121.00 cm/s LVOT Vmean:        68.300 cm/s LVOT VTI:          0.287 m LVOT/AV VTI ratio: 0.82 AI PHT:            570 msec  AORTA Ao Asc diam: 3.30 cm MR Peak grad:    105.7 mmHg   TRICUSPID VALVE MR Mean grad:    76.0 mmHg    TR Peak grad:   54.5 mmHg MR Vmax:         514.00 cm/s  TR Vmax:        369.00 cm/s MR Vmean:        418.0 cm/s MR PISA:         6.28 cm     SHUNTS MR PISA Eff ROA: 49 mm       Systemic VTI:  0.29 m MR PISA Radius:  1.00 cm      Systemic Diam: 2.00 cm Sanda Klein MD Electronically signed by Sanda Klein MD Signature Date/Time: 11/12/2021/2:08:00 PM    Final    Structural Heart Procedure  Result Date: 11/12/2021 See surgical note for result.  CT ANGIO ABDOMEN PELVIS  W &/OR WO CONTRAST  Result Date: 10/17/2021 CLINICAL DATA:  88 year old male with history of severe aortic stenosis. Preprocedural study prior to potential transcatheter aortic valve replacement (TAVR). EXAM: CT ANGIOGRAPHY CHEST, ABDOMEN AND PELVIS TECHNIQUE: Multidetector CT imaging through the chest, abdomen and pelvis was performed using the standard protocol during bolus administration of intravenous contrast. Multiplanar reconstructed images and MIPs were obtained and reviewed to evaluate the vascular anatomy. RADIATION DOSE REDUCTION: This exam was performed according to the departmental dose-optimization program which includes automated exposure control, adjustment of the mA and/or kV according to patient size and/or use of iterative reconstruction technique. CONTRAST:  35mL OMNIPAQUE IOHEXOL 350 MG/ML SOLN COMPARISON:  No prior chest CT. CT the abdomen and pelvis 12/31/2020. FINDINGS: CTA CHEST FINDINGS Cardiovascular: Heart size is enlarged. Trace amount of pericardial fluid and/or thickening, unlikely to be of any hemodynamic significance  at this time. No pericardial calcification. There is aortic atherosclerosis, as well as atherosclerosis of the great vessels of the mediastinum and the coronary arteries, including calcified atherosclerotic plaque in the left main, left anterior descending, left circumflex and right coronary arteries. Thickening calcification of the aortic valve. Mediastinum/Lymph Nodes: No pathologically enlarged mediastinal or hilar lymph nodes. Esophagus is unremarkable in appearance. No axillary lymphadenopathy. Lungs/Pleura: Moderate bilateral pleural effusions lying dependently. Some associated passive subsegmental atelectasis in the lungs dependently. No acute consolidative airspace disease. No suspicious appearing pulmonary nodules or masses are noted. Musculoskeletal/Soft Tissues: There are no aggressive appearing lytic or blastic lesions noted in the visualized portions of the skeleton. CTA ABDOMEN AND PELVIS FINDINGS Hepatobiliary: 2.5 x 1.6 cm low-attenuation lesion in segment 2 of the liver, compatible with a simple cyst. No other suspicious hepatic lesions. No intra or extrahepatic biliary ductal dilatation. Tiny partially calcified gallstone measuring 8 mm in the neck of the gallbladder. Gallbladder is only moderately distended, with no gallbladder wall thickening or surrounding pericholecystic fluid. Pancreas: No pancreatic mass. No pancreatic ductal dilatation. No pancreatic or peripancreatic fluid collections or inflammatory changes. Spleen: Unremarkable. Adrenals/Urinary Tract: Low-attenuation lesions in the right kidney measuring up to 2.5 cm in the anterior aspect of the interpolar region, compatible with simple cysts. Multiple other subcentimeter low-attenuation lesions in both kidneys, too small to characterize, but statistically likely to represent tiny cysts. Mild multifocal cortical thinning in both  kidneys. Bilateral adrenal glands are normal in appearance. No hydroureteronephrosis. Urinary bladder is  unremarkable in appearance. Stomach/Bowel: The appearance of the stomach is normal. No pathologic dilatation of small bowel or colon. A few scattered colonic diverticulae are noted, without definite focal surrounding inflammatory changes to clearly indicate an acute diverticulitis. The appendix is not confidently identified and may be surgically absent. Regardless, there are no inflammatory changes noted adjacent to the cecum to suggest the presence of an acute appendicitis at this time. Vascular/Lymphatic: Aortic atherosclerosis, with vascular findings and measurements pertinent to potential TAVR procedure, as detailed below. Aneurysmal dilatation of the right common iliac artery measuring 1.5 cm in diameter. No lymphadenopathy noted in the abdomen or pelvis. Reproductive: Brachytherapy implants throughout the prostate gland and adjacent soft tissues. Other: Small volume of ascites.  No pneumoperitoneum. Musculoskeletal: There are no aggressive appearing lytic or blastic lesions noted in the visualized portions of the skeleton. VASCULAR MEASUREMENTS PERTINENT TO TAVR: AORTA: Minimal Aortic Diameter-12 x 8 mm Severity of Aortic Calcification-severe RIGHT PELVIS: Right Common Iliac Artery - Minimal Diameter-10.2 x 8.5 mm Tortuosity-moderate to severe Calcification-moderate Right External Iliac Artery - Minimal Diameter-8.1 x 7.4 mm Tortuosity-moderate Calcification-mild Right Common Femoral Artery - Minimal Diameter-6.6 x 4.5 mm Tortuosity-mild Calcification-severe LEFT PELVIS: Left Common Iliac Artery - Minimal Diameter-8.2 x 8.6 mm Tortuosity-mild Calcification-moderate Left External Iliac Artery - Minimal Diameter-7.8 x 8.2 mm Tortuosity-moderate Calcification-mild Left Common Femoral Artery - Minimal Diameter-7.9 x 7.3 mm Tortuosity-mild Calcification-moderate to severe Review of the MIP images confirms the above findings. IMPRESSION: 1. Vascular findings and measurements pertinent to potential TAVR procedure, as  detailed above. 2. Severe thickening and calcification of the aortic valve, compatible with reported clinical history of severe aortic stenosis. 3. Aortic atherosclerosis, in addition to left main and three-vessel coronary artery disease. 4. Moderate bilateral pleural effusions lying dependently with some passive subsegmental atelectasis in the lower lobes of the lungs bilaterally. 5. Cholelithiasis without evidence of acute cholecystitis at this time. 6. Colonic diverticulosis without evidence of acute diverticulitis. 7. Additional incidental findings, as above. Electronically Signed   By: Vinnie Langton M.D.   On: 10/17/2021 12:08   Disposition   Pt is being discharged home today in good condition.  Follow-up Plans & Appointments    Follow-up Information     Eileen Stanford, PA-C Follow up on 11/20/2021.   Specialties: Cardiology, Radiology Why: at 2:30pm. Please arrive at 2:15pm Contact information: [place] [address] [phone]                Discharge Instructions     Call MD for:  difficulty breathing, headache or visual disturbances   Complete by: As directed    Call MD for:  extreme fatigue   Complete by: As directed    Call MD for:  hives   Complete by: As directed    Call MD for:  persistant dizziness or light-headedness   Complete by: As directed    Call MD for:  persistant nausea and vomiting   Complete by: As directed    Call MD for:  redness, tenderness, or signs of infection (pain, swelling, redness, odor or green/yellow discharge around incision site)   Complete by: As directed    Call MD for:  severe uncontrolled pain   Complete by: As directed    Call MD for:  temperature >100.4   Complete by: As directed    Diet - low sodium heart healthy   Complete  by: As directed    Discharge instructions   Complete by: As directed    ACTIVITY AND EXERCISE  Daily activity and exercise are an important part of your recovery.  People recover at different rates depending on their general health and type of valve procedure.  Most people recovering from TAVR feel better relatively quickly   No lifting, pushing, pulling more than 10 pounds (examples to avoid: groceries, vacuuming, gardening, golfing):             - For one week with a procedure through the groin.             - For six weeks for procedures through the chest wall or neck. NOTE: You will typically see one of our providers 7-14 days after your procedure to discuss [place] the above activities.      DRIVING  Do not drive until you are seen for follow up and cleared by a provider. Generally, we ask patient to not drive for 1 week after their procedure.  If you have been told by your doctor in the past that you may not drive, you must talk with him/her before you begin driving again.   DRESSING  Groin site: you may leave the clear dressing over the site for up to one week or until it falls off.   HYGIENE  If you had a femoral (leg) procedure, you may take a shower when you return home. After the shower, pat the site dry. Do NOT use powder, oils or lotions in your groin area until the site has completely healed.  If you had a chest procedure, you may shower when you return home unless specifically instructed not to by your discharging practitioner.             - DO NOT scrub incision; pat dry with a towel.             - DO NOT apply any lotions, oils, powders to the incision.             - No tub baths / swimming for at least 2 weeks.  If you notice any fevers, chills, increased pain, swelling, bleeding or pus, please contact your doctor.   ADDITIONAL INFORMATION  If you are going to have an upcoming dental procedure, please contact our office as you will require antibiotics ahead of time to prevent infection on your heart valve.    If you have any questions or concerns you can call the structural heart phone during normal business hours  8am-4pm. If you have an urgent need after hours or weekends please call [phone] to talk to the on call provider for general cardiology. If you have an emergency that requires immediate attention, please call 911.    After TAVR Checklist  Check  Test Description  Follow up appointment in 1-2 weeks  You will see our structural heart advanced practice provider. Your incision sites will be checked and you will be cleared to drive and resume all normal activities if you are doing well.    1 month echo and follow up  You will have an echo to check on your new heart valve and be seen back in the office by a structural heart advanced practice provider.  Follow up with your primary cardiologist You will need to be seen by your primary cardiologist in the following 3-6 months after your 1 month appointment in the valve clinic.   1 year echo and follow up  You will have another echo to check on your heart valve after 1 year and be seen back in the office by a structural heart advanced practice provider. This your last structural heart visit.  Bacterial endocarditis prophylaxis  You will have to take antibiotics for the rest of your life before all dental procedures (even teeth cleanings) to protect your heart valve. Antibiotics are also required before some surgeries. Please check with your cardiologist before scheduling any surgeries. Also, please make sure to tell us if you have a penicillin allergy as you will require an alternative antibiotic.   If the dressing is still on your incision site when you go home, remove it on the third day after your surgery date. Remove dressing if it begins to fall off, or if it is dirty or damaged before the third day.   Complete by: As directed    Increase activity slowly   Complete by: As directed        Discharge Medications   Allergies as of 11/13/2021       Reactions   Lisinopril Other (See Comments)   Hyperkalemia in the context of combined spironolactone  and lisinopril therapy   Spironolactone Other (See Comments)   Hyperkalemia in context of spironolactone and lisinopril therapy        Medication List     TAKE these medications    allopurinol 300 MG tablet Commonly known as: ZYLOPRIM TAKE 1 TABLET(300 MG) BY MOUTH DAILY AS NEEDED FOR GOUT FLARE   amLODipine 5 MG tablet Commonly known as: NORVASC Take 1 tablet (5 mg total) by mouth daily. Start taking on: November 14, 2021   aspirin EC 81 MG tablet Take 1 tablet (81 mg total) by mouth daily.   furosemide 20 MG tablet Commonly known as: LASIX TAKE 1 TABLET(20 MG) BY MOUTH DAILY   gabapentin 600 MG tablet Commonly known as: NEURONTIN Take 1 tablet (600 mg total) by mouth 2 (two) times daily.   metoprolol tartrate 25 MG tablet Commonly known as: LOPRESSOR Take 1 tablet (25 mg total) by mouth 2 (two) times daily.   pravastatin 40 MG tablet Commonly known as: PRAVACHOL Take 40 mg by mouth daily.   vitamin B-12 500 MCG tablet Commonly known as: CYANOCOBALAMIN Take 500 mcg by mouth daily.   Vitamin D3 50 MCG (2000 UT) capsule Take 2,000 Units by mouth daily.               Discharge Care Instructions  (From admission, onward)           Start     Ordered   11/13/21 0000  If the dressing is still on your incision site when you go home, remove it on the third day after your surgery date. Remove dressing if it begins to fall off, or if it is dirty or damaged before the third day.        11/13/21 1133            Outstanding Labs/Studies   None   Duration of Discharge Encounter   Greater than 30 minutes including physician time.  SignedKathyrn Drown, NP 11/13/2021, 11:33 AM [phone]   Patient seen, examined. Available data reviewed. Agree with findings, assessment, and plan as outlined by Kathyrn Drown, NP.  The patient is independently interviewed and examined this morning.  He has done very well with TAVR with no acute complications.  He is  an elderly male in no acute distress.  Lungs are clear, JVP is  normal, heart is regular rate and rhythm with a 2/6 systolic murmur at the right upper sternal border, abdomen is soft and nontender, bilateral groin sites are clear with no hematoma or ecchymosis, lower extremities have no edema.  Telemetry is reviewed and shows normal sinus rhythm with no significant arrhythmia.  The patient's 2D echocardiogram shows vigorous LV systolic function with severe LVH, normal function of his TAVR bioprosthesis with no paravalvular regurgitation and a mean gradient of 23 mmHg, primarily related to hyperdynamic LV function and increased flow.  The patient's pericardial effusion is unchanged.  The patient is stable for hospital discharge.  Amlodipine is added to better control his blood pressure.  He will require continued diuretic use for treatment of chronic diastolic heart failure.  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  It would be reasonable to consider a PYP scan and outpatient follow-up to evaluate for transthyretin amyloidosis.  Sherren Mocha, M.D. 11/13/2021 5:31 PM

## 2021-11-13 NOTE — Progress Notes (Signed)
CARDIAC REHAB PHASE I   PRE:  Rate/Rhythm: 72 first deg    BP: sitting 159/62    SaO2:   MODE:  Ambulation: 380 ft   POST:  Rate/Rhythm: 100 ST with PACs    BP: sitting 183/63     SaO2: 92 RA  Stood independently and walked with standby assist. SOB with distance but sts it is less than PTA. IV came out before walk therefore not on NTG. Discussed restrictions, walking, and CRPII. Will refer to Page. [id number]   [place], ACSM 11/13/2021 10:00 AM

## 2021-11-13 NOTE — TOC Transition Note (Signed)
Transition of Care (TOC) - CM/SW Discharge Note Marvetta Gibbons RN, BSN Transitions of Care Unit 4E- RN Case Manager See Treatment Team for direct phone #    Patient Details  Name: Paul Mays MRN: [id number] Date of Birth: February 01, 1928  Transition of Care [facility] CM/SW Contact:  Dawayne Patricia, RN Phone Number: 11/13/2021, 11:44 AM   Clinical Narrative:    Pt stable for transition home today s/p TAVR. Transition of Care Department [facility]) has reviewed patient and no TOC needs have been identified at this time. Pt to return home with spouse.   Final next level of care: Home/Self Care Barriers to Discharge: No Barriers Identified   Patient Goals and CMS Choice Patient states their goals for this hospitalization and ongoing recovery are:: return home   Choice offered to / list presented to : NA  Discharge Placement               Home         Discharge Plan and Services                DME Arranged: N/A DME Agency: NA       HH Arranged: NA HH Agency: NA        Social Determinants of Health (SDOH) Interventions     Readmission Risk Interventions Readmission Risk Prevention Plan 11/13/2021  Post Dischage Appt Complete  Medication Screening Complete  Transportation Screening Complete  Some recent data might be hidden

## 2021-11-13 NOTE — Progress Notes (Signed)
°  Echocardiogram 2D Echocardiogram has been performed.  Fidel Levy 11/13/2021, 9:04 AM

## 2021-11-14 ENCOUNTER — Telehealth: Payer: Self-pay | Admitting: Physician Assistant

## 2021-11-14 NOTE — Telephone Encounter (Signed)
°  [place] VALVE TEAM   Patient contacted regarding discharge from [facility] on 2/8  Patient understands to follow up with a structural heart APP on 2/15 at [place].  Patient understands discharge instructions? yes Patient understands medications and regimen? yes Patient understands to bring all medications to this visit? yes  Angelena Form PA-C  [facility]

## 2021-11-15 ENCOUNTER — Other Ambulatory Visit (HOSPITAL_COMMUNITY): Payer: Self-pay | Admitting: *Deleted

## 2021-11-15 DIAGNOSIS — Z952 Presence of prosthetic heart valve: Secondary | ICD-10-CM

## 2021-11-18 ENCOUNTER — Telehealth (HOSPITAL_COMMUNITY): Payer: Self-pay

## 2021-11-18 NOTE — Telephone Encounter (Signed)
Pt insurance is active and benefits verified through Medicare a/b Co-pay 0, DED $226/$226 met, out of pocket 0/0 met, co-insurance 20%. no pre-authorization required. Passport, 11/18/2021_0 :40am, REF# [id number]   2ndary insurance is active and benefits verified through [employer]. Co-pay 0, DED 0/0 met, out of pocket 0/0 met, co-insurance 0%. No pre-authorization required.    Will contact patient to see if he is interested in the Cardiac Rehab Program. If interested, patient will need to complete follow up appt. Once completed, patient will be contacted for scheduling upon review by the RN Navigator.

## 2021-11-18 NOTE — Progress Notes (Addendum)
HEART AND [place]                                     Cardiology Office Note:    Date:  11/20/2021   ID:  Paul Mays, DOB 08-04-1928, MRN [id number]  PCP:  Paul Rail, MD  [facility] Cardiologist:  Paul Mocha, MD  [facility] Electrophysiologist:  None   Referring MD: Paul Rail, MD   [facility] s/p TAVR  History of Present Illness:    Paul Mays is a 88 y.o. male with a hx of CAD remote PCI of the RCA and left circumflex in 1999, left ventricular hypertrophy with septal hypertrophy and LV outflow tract obstruction, possible amyloid but decided to not pursue workup given advanced, HTN, HLD, sleep apnea, peripheral neuropathy, pulmonary hypertension and severe LFLG AS with moderate AI s/p TAVR (11/12/21) who presents to clinic for follow up.   He recently developed progressive DOE and fatigue over the last 6 months. A recent echocardiogram was suggestive of severe aortic stenosis. He was ultimately set up for further workup for TAVR with [facility] and CT imaging. Cath showed severe single-vessel CAD with chronic occlusion of the RCA, collateralized from the left coronary artery as well as severe pulmonary hypertension with pulmonary artery pressure of 78/29, mean 48 mmHg and transpulmonary gradient 23 mmHg, PVR 4.6 Wood units. Medical Rx was recommended for CAD.    He was evaluated by the multidisciplinary valve team and underwent successful TAVR with a 23 mm Edwards Sapien 3 Ultra Resilia via the TF approach on 11/12/21. Post operative echo showed EF 65-70%, moderate LVH, normally functioning TAVR with a mean gradient of 23 mmHg and no PVL as well as mild-mod MR/TR and small circumferential pericardial effusion, RSVP 59.6 mmhg. He was treated with one dose of IV lasix given elevated BNP and CHF on CXR. He was discharged on aspirin alone. Given elevated BPs amlodipine was added.   Today the patient presents to clinic for follow up. Here  with his son. He is feeling much better since surgery. No CP or SOB. Has chronic  LE edema, orthopnea or PND. No dizziness or syncope. No blood in stool or urine. No palpitations.      Past Medical History:  Diagnosis Date   Anxiety    Aortic stenosis    Arthralgia    Arthritis    CAD (coronary artery disease)    Carotid stenosis    Cervicalgia    CLUSTER HEADACHE 02/10/2007   Qualifier: Diagnosis of  By: Paul Darner MD, Paul Mays     Colitis    Edema    Gout    Hearing loss    Herpes zoster without mention of complication    Hyperlipidemia    Hypertension    Hyperuricemia    Impacted cerumen    Migraine    Migraine    Murmur    Olecranon bursitis    OSA (obstructive sleep apnea)    Other and unspecified hyperlipidemia    Other malaise and fatigue    Peripheral neuropathy    Polyneuropathy    Prostate cancer (Paul Mays)    Pulmonary hypertension (HCC)    S/P TAVR (transcatheter aortic valve replacement) 11/12/2021   66mm S3UR via TF approach with Dr. Burt Mays and Dr. Cyndia Mays   Unspecified hereditary and idiopathic peripheral neuropathy    Venous insufficiency  Past Surgical History:  Procedure Laterality Date   ANGIOPLASTY     w/ 2 stents in [place] Bilateral    HEMORRHOID SURGERY     INTRAOPERATIVE TRANSTHORACIC ECHOCARDIOGRAM N/A 11/12/2021   Procedure: INTRAOPERATIVE TRANSTHORACIC ECHOCARDIOGRAM;  Surgeon: Paul Mocha, MD;  Location: [place];  Service: Open Heart Surgery;  Laterality: N/A;   LIPOMA EXCISION     RIGHT/LEFT HEART CATH AND CORONARY ANGIOGRAPHY N/A 10/02/2021   Procedure: RIGHT/LEFT HEART CATH AND CORONARY ANGIOGRAPHY;  Surgeon: Paul Mocha, MD;  Location: [place] [facility];  Service: Cardiovascular;  Laterality: N/A;   TONSILLECTOMY     TRANSCATHETER AORTIC VALVE REPLACEMENT, TRANSFEMORAL N/A 11/12/2021   Procedure: TRANSCATHETER AORTIC VALVE REPLACEMENT, TRANSFEMORAL;  Surgeon: Paul Mocha, MD;  Location: [name];  Service: Open Heart Surgery;  Laterality: N/A;   ULTRASOUND GUIDANCE FOR VASCULAR ACCESS Bilateral 11/12/2021   Procedure: ULTRASOUND GUIDANCE FOR VASCULAR ACCESS;  Surgeon: Paul Mocha, MD;  Location: [place];  Service: Open Heart Surgery;  Laterality: Bilateral;    Current Medications: Current Meds  Medication Sig   allopurinol (ZYLOPRIM) 300 MG tablet TAKE 1 TABLET(300 MG) BY MOUTH DAILY AS NEEDED FOR GOUT FLARE   amLODipine (NORVASC) 5 MG tablet Take 2 tablets (10 mg total) by mouth daily.   amoxicillin (AMOXIL) 500 MG tablet Take 4 tablets (2,000 mg total) by mouth as directed. 1 HOUR PRIOR TO DENTAL APPOINTMENTS   aspirin EC 81 MG tablet Take 1 tablet (81 mg total) by mouth daily.   Cholecalciferol (VITAMIN D3) 50 MCG (2000 UT) capsule Take 2,000 Units by mouth daily.   furosemide (LASIX) 20 MG tablet TAKE 1 TABLET(20 MG) BY MOUTH DAILY   gabapentin (NEURONTIN) 600 MG tablet Take 1 tablet (600 mg total) by mouth 2 (two) times daily.   metoprolol tartrate (LOPRESSOR) 25 MG tablet Take 1 tablet (25 mg total) by mouth 2 (two) times daily.   pravastatin (PRAVACHOL) 40 MG tablet Take 40 mg by mouth daily.   vitamin B-12 (CYANOCOBALAMIN) 500 MCG tablet Take 500 mcg by mouth daily.   [DISCONTINUED] amLODipine (NORVASC) 5 MG tablet Take 1 tablet (5 mg total) by mouth daily.     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 Patient finished  two years of college.    Pt has children.   Caffeine -two cups daily.   Left handed.   Retired Licensed conveyancer                    Social Determinants of Health  Financial Resource Strain: Low Risk    Difficulty of Paying Living Expenses: Not hard at all  Food Insecurity: No Food Insecurity   Worried About Charity fundraiser in the Last Year: Never true   Ran Out of Food in the Last Year: Never true  Transportation Needs: No Transportation Needs   Lack of Transportation (Medical): No   Lack of Transportation (Non-Medical): No  Physical Activity: Sufficiently Active   Days of Exercise per Week: 5 days   Minutes of Exercise per Session: 30 min  Stress: No Stress Concern Present   Feeling of Stress : Not at all  Social Connections: Socially Integrated   Frequency of Communication with Friends and Family: More than Paul times a week   Frequency of Social Gatherings with Friends and Family: More than Paul times a week   Attends Religious Services: More than 4 times per year   Active Member of [employer] or Organizations: Yes   Attends Music therapist: More than 4 times per year   Marital Status: Married     Family History: The patient's family history includes Diabetes in his brother, father, and sister; Healthy in his daughter and son; High blood pressure in his father; Other in his mother; Stroke in his father.  ROS:   Please see the history of present illness.    All other systems reviewed and are negative.  EKGs/Labs/Other Studies Reviewed:    The following studies were reviewed today:  TAVR OPERATIVE NOTE     Date of Procedure:                11/12/2021   Preoperative Diagnosis:      Severe Aortic Stenosis    Postoperative Diagnosis:     Same    Procedure:        Transcatheter Aortic Valve Replacement - Percutaneous Left Transfemoral Approach             Edwards Sapien 3 Ultra ResiliaTHV (size 23 mm, model # 9755RSL, serial # [id number])                Anesthesiologist:                  Renold Don, MD   Echocardiographer:              Bertrum [name], MD   Pre-operative Echo Findings: Severe aortic stenosis Normal left ventricular systolic function   Post-operative Echo Findings: No paravalvular leak Normal left ventricular systolic function   ___________   Echo 11/13/21: IMPRESSIONS  1. Left ventricular ejection fraction, by estimation, is 65 to 70%. The  left ventricle has hyperdynamic function. The left ventricle has no  regional wall motion abnormalities. There is moderate concentric left  ventricular hypertrophy. Left ventricular  diastolic parameters are consistent with Grade II diastolic dysfunction  (pseudonormalization). Elevated left atrial pressure.   2. Right ventricular systolic function is normal. The right ventricular  size is normal. There is moderately elevated pulmonary artery systolic  pressure. The estimated right ventricular systolic pressure is 61.6 mmHg.   3. Left atrial size was severely dilated.   4. Pericardial effusion is unchanged from the preoperative study. The  pericardial effusion is circumferential. There is no evidence of cardiac  tamponade.   5. The mitral valve is degenerative. Mild to moderate mitral valve  regurgitation. No evidence of mitral stenosis. Moderate mitral annular  calcification.   6. Tricuspid valve regurgitation is mild to moderate.   7. Aortic prosthesis  gradients are exaggerated by hyperdynamic left  ventricular function. The aortic valve has been repaired/replaced. Aortic  valve regurgitation is not visualized. There is a 23 mm Edwards Sapien  prosthetic (TAVR) valve present in the  aortic position. Procedure Date: 11/12/21. Echo findings are consistent with   normal structure and function of the aortic valve prosthesis. Aortic valve  mean gradient measures 23.0 mmHg. Aortic valve Vmax measures 3.22 m/s.  Aortic valve acceleration time  measures 74 msec.   8. The inferior vena cava is dilated in size with >50% respiratory  variability, suggesting right atrial pressure of 8 mmHg.    EKG:  EKG is ordered today.  The ekg ordered today demonstrates sinus brady with prolongation of PR from 302--> 338 ms. HR 53  Recent Labs: 02/19/2021: TSH 2.34 11/08/2021: ALT 18; B Natriuretic Peptide >4,500.0 11/13/2021: BUN 30; Creatinine, Ser 1.32; Hemoglobin 11.4; Platelets 80; Potassium 3.8; Sodium 143  Recent Lipid Panel    Component Value Date/Time   CHOL 149 12/09/2017 1457   TRIG 57.0 12/09/2017 1457   HDL 72.30 12/09/2017 1457   CHOLHDL 2 12/09/2017 1457   VLDL 11.4 12/09/2017 1457   LDLCALC 65 12/09/2017 1457     Risk Assessment/Calculations:       Physical Exam:    VS:  BP (!) 152/60    Pulse (!) 51    Ht 5\' 5"  (1.651 m)    Wt 150 lb (68 kg)    SpO2 93%    BMI 24.96 kg/m     Wt Readings from Last 3 Encounters:  11/20/21 150 lb (68 kg)  11/13/21 145 lb 9.6 oz (66 kg)  11/08/21 151 lb 11.2 oz (68.8 kg)     GEN:  Well nourished, well developed in no acute distress HEENT: Normal NECK: No JVD LYMPHATICS: No lymphadenopathy CARDIAC: RRR, very soft flow murmur. No rubs, gallops RESPIRATORY:  Clear to auscultation without rales, wheezing or rhonchi  ABDOMEN: Soft, non-tender, non-distended MUSCULOSKELETAL:  No edema; No deformity  SKIN: Warm and dry.  Groin sites clear without hematoma. Right pubic bones with mild ecchymosis. Mild chronic bilateral LE edema with chronic venous stasis changes. NEUROLOGIC:  Alert and oriented x 3 PSYCHIATRIC:  Normal affect   ASSESSMENT:    1. S/P TAVR (transcatheter aortic valve replacement)   2. Essential hypertension   3. Chronic diastolic CHF (congestive heart failure) ([place])   4. CAD S/P  percutaneous coronary angioplasty   5. Hypertrophic cardiomyopathy (HCC)    PLAN:    In order of problems listed above:  Severe AS s/p TAVR: doing great. Groin sites are healing well. ECG with no HAVB although PR is slightly prolonged. No dizziness or syncope. Groins are healing well. Cleared to drive. SBE prophylaxis discussed; I have RX'd amoxicillin. Continue on aspirin alone. I will see him back in march for 1 month follow up with echo.    Hypertension: Norvac added at discharge.  BP remains elevated today. Will increase this to 10mg  daily. Continue Lasix 20mg  daily and Lopressor 25mg  BID. Would avoid ACE/ARB as he has had issues with hyperkalemia with this class of medications.    Chronic diastolic CHF: appears euvolemic. Continue lasix at current dose.   CAD: s/p remote PCI of the RCA and left circumflex in 1999. Recent cath with stable disease. No anginal symptoms. Will continue with medical therapy for CAD.    Left ventricular hypertrophy with septal hypertrophy and LV outflow tract obstruction: felt to possibly have amyloid however but decided  to not pursue workup given advanced age.       Cardiac Rehabilitation Eligibility Assessment  The patient is ready to start cardiac rehabilitation from a cardiac standpoint.    Medication Adjustments/Labs and Tests Ordered: Current medicines are reviewed at length with the patient today.  Concerns regarding medicines are outlined above.  No orders of the defined types were placed in this encounter.  Meds ordered this encounter  Medications   amoxicillin (AMOXIL) 500 MG tablet    Sig: Take 4 tablets (2,000 mg total) by mouth as directed. 1 HOUR PRIOR TO DENTAL APPOINTMENTS    Dispense:  12 tablet    Refill:  6   amLODipine (NORVASC) 5 MG tablet    Sig: Take 2 tablets (10 mg total) by mouth daily.    Dispense:  180 tablet    Refill:  3    Patient Instructions  Medication Instructions:  Your physician has recommended you make the  following change in your medication:  INCREASE AMLODIPINE TO  10 MG DAILY START AMOXICILLIN 500 MG: TAKE 4 TABLETS (2000 MG) 1 HOUR PRIOR TO DENTAL APPOINTMENTS  *If you need a refill on your cardiac medications before your next appointment, please call your pharmacy*   Lab Work: NONE If you have labs (blood work) drawn today and your tests are completely normal, you will receive your results only by: [place] (if you have MyChart) OR A paper copy in the mail If you have any lab test that is abnormal or we need to change your treatment, we will call you to review the results.   Testing/Procedures: NONE   Follow-Up: At [facility], you and your health needs are our priority.  As part of our continuing mission to provide you with exceptional heart care, we have created designated Provider Care Teams.  These Care Teams include your primary Cardiologist (physician) and Advanced Practice Providers (APPs -  Physician Assistants and Nurse Practitioners) who all work together to provide you with the care you need, when you need it.  We recommend signing up for the patient portal called "MyChart".  Sign up information is provided on this After Visit Summary.  MyChart is used to connect with patients for Virtual Visits (Telemedicine).  Patients are able to view lab/test results, encounter notes, upcoming appointments, etc.  Non-urgent messages can be sent to your provider as well.   To learn more about what you can do with MyChart, go to [email or web address].    Your next appointment:   KEEP SCHEDULED FOLLOW UP   Signed, Angelena Form, PA-C  11/20/2021 3:23 PM    [facility]

## 2021-11-20 ENCOUNTER — Ambulatory Visit (INDEPENDENT_AMBULATORY_CARE_PROVIDER_SITE_OTHER): Payer: Medicare Other | Admitting: Physician Assistant

## 2021-11-20 ENCOUNTER — Other Ambulatory Visit: Payer: Self-pay

## 2021-11-20 VITALS — BP 152/60 | HR 51 | Ht 65.0 in | Wt 150.0 lb

## 2021-11-20 DIAGNOSIS — I2510 Atherosclerotic heart disease of native coronary artery without angina pectoris: Secondary | ICD-10-CM

## 2021-11-20 DIAGNOSIS — Z952 Presence of prosthetic heart valve: Secondary | ICD-10-CM | POA: Diagnosis not present

## 2021-11-20 DIAGNOSIS — I10 Essential (primary) hypertension: Secondary | ICD-10-CM

## 2021-11-20 DIAGNOSIS — Z9861 Coronary angioplasty status: Secondary | ICD-10-CM

## 2021-11-20 DIAGNOSIS — I422 Other hypertrophic cardiomyopathy: Secondary | ICD-10-CM | POA: Diagnosis not present

## 2021-11-20 DIAGNOSIS — I5032 Chronic diastolic (congestive) heart failure: Secondary | ICD-10-CM | POA: Diagnosis not present

## 2021-11-20 MED ORDER — AMLODIPINE BESYLATE 5 MG PO TABS: 10.0000 mg | ORAL_TABLET | Freq: Every day | ORAL | 3 refills | Status: DC

## 2021-11-20 MED ORDER — AMOXICILLIN 500 MG PO TABS: 2000.0000 mg | ORAL_TABLET | ORAL | 6 refills | Status: DC

## 2021-11-20 NOTE — Addendum Note (Signed)
Addended by: Gwendlyn Deutscher on: 11/20/2021 03:34 PM   Modules accepted: Orders

## 2021-11-20 NOTE — Patient Instructions (Signed)
Medication Instructions:  Your physician has recommended you make the following change in your medication:  INCREASE AMLODIPINE TO  10 MG DAILY START AMOXICILLIN 500 MG: TAKE 4 TABLETS (2000 MG) 1 HOUR PRIOR TO DENTAL APPOINTMENTS  *If you need a refill on your cardiac medications before your next appointment, please call your pharmacy*   Lab Work: NONE If you have labs (blood work) drawn today and your tests are completely normal, you will receive your results only by: [place] (if you have MyChart) OR A paper copy in the mail If you have any lab test that is abnormal or we need to change your treatment, we will call you to review the results.   Testing/Procedures: NONE   Follow-Up: At [facility], you and your health needs are our priority.  As part of our continuing mission to provide you with exceptional heart care, we have created designated Provider Care Teams.  These Care Teams include your primary Cardiologist (physician) and Advanced Practice Providers (APPs -  Physician Assistants and Nurse Practitioners) who all work together to provide you with the care you need, when you need it.  We recommend signing up for the patient portal called "MyChart".  Sign up information is provided on this After Visit Summary.  MyChart is used to connect with patients for Virtual Visits (Telemedicine).  Patients are able to view lab/test results, encounter notes, upcoming appointments, etc.  Non-urgent messages can be sent to your provider as well.   To learn more about what you can do with MyChart, go to [email or web address].    Your next appointment:   KEEP SCHEDULED FOLLOW UP

## 2021-11-25 ENCOUNTER — Other Ambulatory Visit: Payer: Self-pay | Admitting: Physician Assistant

## 2021-11-25 ENCOUNTER — Telehealth: Payer: Self-pay | Admitting: Cardiovascular Disease

## 2021-11-25 DIAGNOSIS — R600 Localized edema: Secondary | ICD-10-CM

## 2021-11-25 DIAGNOSIS — Z0189 Encounter for other specified special examinations: Secondary | ICD-10-CM

## 2021-11-25 DIAGNOSIS — Z79899 Other long term (current) drug therapy: Secondary | ICD-10-CM

## 2021-11-25 DIAGNOSIS — Z952 Presence of prosthetic heart valve: Secondary | ICD-10-CM

## 2021-11-25 MED ORDER — AMLODIPINE BESYLATE 5 MG PO TABS: 5.0000 mg | ORAL_TABLET | Freq: Every day | ORAL | 3 refills | Status: DC

## 2021-11-25 MED ORDER — HYDRALAZINE HCL 25 MG PO TABS: 25.0000 mg | ORAL_TABLET | Freq: Two times a day (BID) | ORAL | 1 refills | Status: DC

## 2021-11-25 NOTE — Telephone Encounter (Signed)
Pt not home, will call him later before leaving today.

## 2021-11-25 NOTE — Telephone Encounter (Signed)
Pt c/o swelling: STAT is pt has developed SOB within 24 hours  How much weight have you gained and in what time span?  No weight gain  If swelling, where is the swelling located?  Feet and legs   Are you currently taking a fluid pill?  No   Are you currently SOB?  No   Do you have a log of your daily weights (if so, list)?  No log available  Have you gained 3 pounds in a day or 5 pounds in a week?  No   Have you traveled recently?  No

## 2021-11-25 NOTE — Telephone Encounter (Signed)
Pt reports ankle edema since increasing Amlodipine. Pt is unable to take his BP at home. Advised to go back to 5 mg daily (pt already too his 10 mg dose this morning).  Will forward to Angelena Form who saw pt last week. Pt understands office will call once advised upon, pt agreeable to plan

## 2021-11-25 NOTE — Telephone Encounter (Signed)
Agree. I have changed in on his med list. I also called in hyralazine 25mg  BID to his pharmacy. Please have him start taking that.

## 2021-11-25 NOTE — Telephone Encounter (Signed)
Pt aware to decrease Amlodipine back to 5 mg once daily and start Hydralazine 25 mg BID per Bonney Leitz, PA. Pt agreeable to plan and will go pick up up today. He will call office back if no improvement in symptoms.

## 2021-11-28 NOTE — Telephone Encounter (Signed)
Patient call back to say that he still has swelling in his feet and legs

## 2021-11-29 MED ORDER — FUROSEMIDE 40 MG PO TABS: 40.0000 mg | ORAL_TABLET | Freq: Every day | ORAL | 2 refills | Status: DC

## 2021-11-29 MED ORDER — POTASSIUM CHLORIDE CRYS ER 10 MEQ PO TBCR: 10.0000 meq | EXTENDED_RELEASE_TABLET | Freq: Every day | ORAL | 1 refills | Status: DC

## 2021-11-29 NOTE — Addendum Note (Signed)
Addended by: Nuala Alpha on: 11/29/2021 09:36 AM   Modules accepted: Orders

## 2021-11-29 NOTE — Telephone Encounter (Signed)
Eileen Stanford, PA-C  You 4 minutes ago (9:24 AM)   Lets increase his Lasix to 40mg  daily and add Kdur 39meq daily. I will recheck labs at next visit 3/10      Pt aware that per Nell Range PA-C, she would like for him to increase his lasix to taking 40 mg po daily and start taking KDUR 10 mEq po daily and we will recheck his lab when he comes back into the office to have his echo done and see Structural Team for an office visit on 12/13/21.  He is aware he will need to stop by the lab prior to his scheduled visits, or after he see's Nell Range PA-C in follow-up.  Confirmed the pharmacy of choice with the pt. Pt verbalized understanding and agrees with this plan

## 2021-12-04 DIAGNOSIS — S81802A Unspecified open wound, left lower leg, initial encounter: Secondary | ICD-10-CM | POA: Diagnosis not present

## 2021-12-06 ENCOUNTER — Emergency Department (HOSPITAL_BASED_OUTPATIENT_CLINIC_OR_DEPARTMENT_OTHER)
Admission: EM | Admit: 2021-12-06 | Discharge: 2021-12-06 | Disposition: A | Discharge status: Home / Self Care | Payer: Medicare Other | Attending: Emergency Medicine | Admitting: Emergency Medicine

## 2021-12-06 ENCOUNTER — Other Ambulatory Visit: Payer: Self-pay

## 2021-12-06 DIAGNOSIS — S81851A Open bite, right lower leg, initial encounter: Secondary | ICD-10-CM | POA: Diagnosis not present

## 2021-12-06 DIAGNOSIS — Z79899 Other long term (current) drug therapy: Secondary | ICD-10-CM | POA: Insufficient documentation

## 2021-12-06 DIAGNOSIS — Z7982 Long term (current) use of aspirin: Secondary | ICD-10-CM | POA: Diagnosis not present

## 2021-12-06 DIAGNOSIS — S80811A Abrasion, right lower leg, initial encounter: Secondary | ICD-10-CM | POA: Insufficient documentation

## 2021-12-06 DIAGNOSIS — W5501XA Bitten by cat, initial encounter: Secondary | ICD-10-CM | POA: Insufficient documentation

## 2021-12-06 DIAGNOSIS — S8991XA Unspecified injury of right lower leg, initial encounter: Secondary | ICD-10-CM | POA: Diagnosis present

## 2021-12-06 NOTE — ED Notes (Signed)
Dc instructions reviewed with pt no questions or concerns at this time.  

## 2021-12-06 NOTE — Discharge Instructions (Addendum)
Call your primary care doctor or specialist as discussed in the next 2-3 days.   Return immediately back to the ER if:  Your symptoms worsen within the next 12-24 hours. You develop new symptoms such as new fevers, persistent vomiting, new pain, shortness of breath, or new weakness or numbness, or if you have any other concerns.  

## 2021-12-06 NOTE — ED Triage Notes (Signed)
Pt was bit by cat last week right lower leg continues to bleed and was advised by [place] to come to ER to be evaluated.pt denies any pain. Pt had aortic valve replacement 2 weeks ago.  ?

## 2021-12-06 NOTE — ED Provider Notes (Signed)
?[facility] ?Provider Note ? ? ?CSN: [id number] ?Arrival date & time: 12/06/21  1055 ? ?  ? ?History ? ?Chief Complaint  ?Patient presents with  ? Animal Bite  ? ? ?[facility] is a 88 y.o. male. ? ?Patient presents with bleeding from the right lower extremity.  He states he sustained a cat bite several weeks ago and then again another cat bite a week ago.  Has been placed on antibiotics which has been taking regularly and doing daily dressing changes.  He was try to change her dressing today however the dressing was stuck to his leg.  When he acted off he had increased bleeding that he could not stop so he presents to the ER.  Otherwise denies any fevers or cough or vomiting or diarrhea.  He states his right lower extremity is always red at baseline and swollen at baseline and unchanged today.  Denies chest pain abdominal pain or other extremity pain. ? ? ?  ? ?Home Medications ?Prior to Admission medications   ?Medication Sig Start Date End Date Taking? Authorizing Provider  ?allopurinol (ZYLOPRIM) 300 MG tablet TAKE 1 TABLET(300 MG) BY MOUTH DAILY AS NEEDED FOR GOUT FLARE 08/06/21   Binnie Rail, MD  ?amLODipine (NORVASC) 5 MG tablet Take 1 tablet (5 mg total) by mouth daily. 11/25/21 02/23/22  Eileen Stanford, PA-C  ?amoxicillin (AMOXIL) 500 MG tablet Take 4 tablets (2,000 mg total) by mouth as directed. 1 HOUR PRIOR TO DENTAL APPOINTMENTS 11/20/21   Eileen Stanford, PA-C  ?aspirin EC 81 MG tablet Take 1 tablet (81 mg total) by mouth daily. 08/29/19   Darreld Mclean, PA-C  ?Cholecalciferol (VITAMIN D3) 50 MCG (2000 UT) capsule Take 2,000 Units by mouth daily.    [provider]  ?furosemide (LASIX) 40 MG tablet Take 1 tablet (40 mg total) by mouth daily. 11/29/21   Eileen Stanford, PA-C  ?gabapentin (NEURONTIN) 600 MG tablet Take 1 tablet (600 mg total) by mouth 2 (two) times daily. 05/16/21   Binnie Rail, MD  ?hydrALAZINE (APRESOLINE) 25 MG tablet Take 1  tablet (25 mg total) by mouth in the morning and at bedtime. 11/25/21 11/25/22  Eileen Stanford, PA-C  ?metoprolol tartrate (LOPRESSOR) 25 MG tablet Take 1 tablet (25 mg total) by mouth 2 (two) times daily. 03/08/21   Binnie Rail, MD  ?potassium chloride (KLOR-CON M) 10 MEQ tablet Take 1 tablet (10 mEq total) by mouth daily. 11/29/21   Eileen Stanford, PA-C  ?pravastatin (PRAVACHOL) 40 MG tablet Take 40 mg by mouth daily.    [provider]  ?vitamin B-12 (CYANOCOBALAMIN) 500 MCG tablet Take 500 mcg by mouth daily.    [provider]  ?   ? ?Allergies    ?Lisinopril and Spironolactone   ? ?Review of Systems   ?Review of Systems  ?Constitutional:  Negative for fever.  ?HENT:  Negative for ear pain and sore throat.   ?Eyes:  Negative for pain.  ?Respiratory:  Negative for cough.   ?Cardiovascular:  Negative for chest pain.  ?Gastrointestinal:  Negative for abdominal pain.  ?Genitourinary:  Negative for flank pain.  ?Musculoskeletal:  Negative for back pain.  ?Skin:  Positive for wound. Negative for color change and rash.  ?Neurological:  Negative for syncope.  ?All other systems reviewed and are negative. ? ?Physical Exam ?Updated Vital Signs ?BP (!) 168/86 (BP Location: Right Arm)   Pulse 70   Temp 97.6 ?F (36.4 ?  C)   Resp 18   Ht 5\' 5"  (1.651 m)   Wt 68 kg   SpO2 99%   BMI 24.95 kg/m?  ?Physical Exam ?Constitutional:   ?   Appearance: He is well-developed.  ?HENT:  ?   Head: Normocephalic.  ?   Nose: Nose normal.  ?Eyes:  ?   Extraocular Movements: Extraocular movements intact.  ?Cardiovascular:  ?   Rate and Rhythm: Normal rate.  ?Pulmonary:  ?   Effort: Pulmonary effort is normal.  ?Skin: ?   Coloration: Skin is not jaundiced.  ?   Comments: Patient has multiple skin tears and abrasions to the right lower extremity.  Dressing had been applied which I removed.  No active bleeding seen after removal of dressing.  Right lower extremity is erythematous and mild to moderately swollen.   However the patient states that this is his baseline and has not changed in the last several weeks.  ?Neurological:  ?   Mental Status: He is alert. Mental status is at baseline.  ? ? ?ED Results / Procedures / Treatments   ?Labs ?(all labs ordered are listed, but only abnormal results are displayed) ?Labs Reviewed - No data to display ? ?EKG ?None ? ?Radiology ?No results found. ? ?Procedures ?Procedures  ? ? ?Medications Ordered in ED ?Medications - No data to display ? ?ED Course/ Medical Decision Making/ A&P ?  ?                        ?Medical Decision Making ? ?History obtained from the patient and his son at bedside. ? ?Review of chart shows telephone evaluation to have raised [phone] with cardiology.  Patient recently had aortic valve replacement.  Continuing on aspirin. ? ?No additional labs or studies ordered today.  He does not endorse any swelling or increased redness or worsening of the right lower extremity.  He does not endorse any fevers. ? ?The bleeding appears to have stopped.  Additional pressure applied with no additional adverse bleeding. ? ?Patient advised use of nonadherent gauze to help because not stick to his wound.  Family comfortable with the plan.  Discharged home stable condition advised daily wound changes and continued to finish his antibiotics.  Advised immediate return if he has fevers increased swelling increased pain or any additional concerns. ? ? ? ? ? ? ?Final Clinical Impression(s) / ED Diagnoses ?Final diagnoses:  ?Cat bite, initial encounter  ? ? ?Rx / DC Orders ?ED Discharge Orders   ? ? None  ? ?  ? ? ?  ?Luna Fuse, MD ?12/06/21 1231 ? ?

## 2021-12-10 ENCOUNTER — Ambulatory Visit: Payer: Medicare Other | Admitting: Internal Medicine

## 2021-12-11 ENCOUNTER — Ambulatory Visit: Payer: Medicare Other

## 2021-12-11 NOTE — Progress Notes (Signed)
HE[facility] [place]                                     Cardiology Office Note:    Date:  12/13/2021   ID:  [name], DOB [birth year], MRN [id number]  PCP:  Binnie Rail, MD  [facility] Cardiologist:  Sherren Mocha, MD  [facility] Electrophysiologist:  None   Referring MD: Binnie Rail, MD   1 month s/p TAVR  History of Present Illness:    KIMBERLY COYE is a 88 y.o. male with a hx of CAD remote PCI of the RCA and left circumflex in 1999, left ventricular hypertrophy with septal hypertrophy and LV outflow tract obstruction, possible amyloid but decided to not pursue workup given advanced, HTN, HLD, sleep apnea, peripheral neuropathy, pulmonary hypertension and severe LFLG AS with moderate AI s/p TAVR (11/12/21) who presents to clinic for follow up.   He recently developed progressive DOE and fatigue over the last 6 months. A recent echocardiogram was suggestive of severe aortic stenosis. He was ultimately set up for further workup for TAVR with [facility] and CT imaging. Cath showed severe single-vessel CAD with chronic occlusion of the RCA, collateralized from the left coronary artery as well as severe pulmonary hypertension with pulmonary artery pressure of 78/29, mean 48 mmHg and transpulmonary gradient 23 mmHg, PVR 4.6 Wood units. Medical Rx was recommended for CAD.    He was evaluated by the multidisciplinary valve team and underwent successful TAVR with a 23 mm Edwards Sapien 3 Ultra Resilia via the TF approach on 11/12/21. Post operative echo showed EF 65-70%, moderate LVH, normally functioning TAVR with a mean gradient of 23 mmHg and no PVL as well as mild-mod MR/TR and small circumferential pericardial effusion, RSVP 59.6 mmhg. He was treated with one dose of IV lasix given elevated BNP and CHF on CXR. He was discharged on aspirin alone. Given elevated BPs amlodipine was added.   At follow up BP was elevated and Norvasc increased  to '10mg'$  daily. He later called in with worsening LE edema. Norvasc was decreased back to '5mg'$  daily and Hydralazine '25mg'$  BID added. He called back again with no improvement and Lasix increased to '40mg'$  daily and Kdur 46mq daily added.   Today the patient presents to clinic for follow up. Here alone. He has had a big improvement in breathing since TAVR. No CP or SOB. No LE edema, orthopnea or PND. No dizziness or syncope. No blood in stool or urine. No palpitations. LE edema much improved, right leg has chronic venous stasis with wounds.    Past Medical History:  Diagnosis Date   Anxiety    Aortic stenosis    Arthralgia    Arthritis    CAD (coronary artery disease)    Carotid stenosis    Cervicalgia    CLUSTER HEADACHE 02/10/2007   Qualifier: Diagnosis of  By: HLinna DarnerMD, WGwyndolyn Saxon    Colitis    Edema    Gout    Hearing loss    Herpes zoster without mention of complication    Hyperlipidemia    Hypertension    Hyperuricemia    Impacted cerumen    Migraine    Migraine    Murmur    Olecranon bursitis    OSA (obstructive sleep apnea)    Other and unspecified hyperlipidemia  Other malaise and fatigue    Peripheral neuropathy    Polyneuropathy    Prostate cancer ([place])    Pulmonary hypertension (HCC)    S/P TAVR (transcatheter aortic valve replacement) 11/12/2021   93m S3UR via TF approach with Dr. CBurt Knackand Dr. BCyndia Bent  Unspecified hereditary and idiopathic peripheral neuropathy    Venous insufficiency     Past Surgical History:  Procedure Laterality Date   ANGIOPLASTY     [place]    HEMOR[place]HO[place] SURGERY     INTRAOPERATIVE TRANSTHORACIC ECHOCARDIOGRAM N/A 11/12/2021   Procedure: INTRAOPERATIVE TRANSTHORACIC ECHOCARDIOGRAM;  Surgeon: CSherren Mocha MD;  [place]  Service: Open Heart Surgery;  Laterality: N/A;   LIPOMA EXCISION     RIGHT/LEFT HEART CATH AND CORONARY ANGIOGRAPHY  N/A 10/02/2021   Procedure: RIGHT/LEFT HEART CATH AND CORONARY ANGIOGRAPHY;  Surgeon: CSherren Mocha MD;  [facility];  Service: Cardiovascular;  Laterality: N/A;   TONSILLECTOMY     TRANSCATHETER AORTIC VALVE REPLACEMENT, TRANSFEMORAL N/A 11/12/2021   Procedure: TRANSCATHETER AORTIC VALVE REPLACEMENT, TRANSFEMORAL;  Surgeon: CSherren Mocha MD;  [facility]  Service: Open Heart Surgery;  Laterality: N/A;   ULTRASOUND GUIDANCE FOR VASCULAR ACCESS Bilateral 11/12/2021   Procedure: ULTRASOUND GUIDANCE FOR VASCULAR ACCESS;  Surgeon: CSherren Mocha MD;  [place] [facility]  Service: Open Heart Surgery;  Laterality: Bilateral;    Current Medications: Current Meds  Medication Sig   allopurinol (ZYLOPRIM) 300 MG tablet TAKE 1 TABLET(300 MG) BY MOUTH DAILY AS NEEDED FOR GOUT FLARE   amLODipine (NORVASC) 5 MG tablet Take 1 tablet (5 mg total) by mouth daily.   amoxicillin (AMOXIL) 500 MG tablet Take 4 tablets (2,000 mg total) by mouth as directed. 1 HOUR PRIOR TO DENTAL APPOINTMENTS   aspirin EC 81 MG tablet Take 1 tablet (81 mg total) by mouth daily.   Cholecalciferol (VITAMIN D3) 50 MCG (2000 UT) capsule Take 2,000 Units by mouth daily.   furosemide (LASIX) 40 MG tablet Take 1 tablet (40 mg total) by mouth daily.   gabapentin (NEURONTIN) 600 MG tablet Take 1 tablet (600 mg total) by mouth 2 (two) times daily.   hydrALAZINE (APRESOLINE) 25 MG tablet Take 1 tablet (25 mg total) by mouth in the morning and at bedtime.   metoprolol tartrate (LOPRESSOR) 25 MG tablet Take 1 tablet (25 mg total) by mouth 2 (two) times daily.   potassium chloride (KLOR-CON M) 10 MEQ tablet Take 1 tablet (10 mEq total) by mouth daily.   pravastatin (PRAVACHOL) 40 MG tablet Take 40 mg by mouth daily.   vitamin B-12 (CYANOCOBALAMIN) 500 MCG tablet Take 500 mcg by mouth daily.     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 Patient finished  two years of college.    Pt has children.   Caffeine -two cups daily.   Left handed.   Retired Licensed conveyancer                    Social Determinants of Radio broadcast assistant Strain: Low Risk    Difficulty of Paying Living Expenses: Not hard at all  Food Insecurity: No Food Insecurity   Worried About Charity fundraiser in the Last Year: Never true   Arboriculturist in the Last Year: Never true  Transportation Needs: No Transportation Needs   Lack of Transportation (Medical): No   Lack of Transportation (Non-Medical): No  Physical Activity: Sufficiently Active   Days of Exercise per Week: 5 days   Minutes of Exercise per Session: 30 min  Stress: No Stress Concern Present   Feeling of Stress : Not at all  Social Connections: Socially Integrated   Frequency of Communication with Friends and Family: More than three times a week   Frequency of Social Gatherings with Friends and Family: More than three times a week   Attends Religious Services: More than 4 times per year   Active Member of [employer] or Organizations: Yes   Attends Music therapist: More than 4 times per year   Marital Status: Married     Family History: The patient's family history includes Diabetes in his brother, father, and  sister; Healthy in his daughter and son; High blood pressure in his father; Other in his mother; Stroke in his father.  ROS:   Please see the history of present illness.    All other systems reviewed and are negative.  EKGs/Labs/Other Studies Reviewed:    The following studies were reviewed today:  TAVR OPERATIVE NOTE     Date of Procedure:                11/12/2021   Preoperative Diagnosis:      Severe Aortic Stenosis    Postoperative Diagnosis:    Same    Procedure:        Transcatheter Aortic Valve Replacement - Percutaneous Left Transfemoral Approach             Edwards Sapien 3 Ultra ResiliaTHV (size 23 mm, model # 9755RSL, serial # [id number])                Anesthesiologist:                  Renold Don, MD   Echocardiographer:              Bertrum [name], MD   Pre-operative Echo Findings: Severe aortic stenosis Normal left ventricular systolic function   Post-operative Echo Findings: No paravalvular leak Normal left ventricular systolic function   ___________   Echo 11/13/21: IMPRESSIONS  1. Left ventricular ejection fraction, by estimation, is 65 to 70%. The  left ventricle has hyperdynamic function. The left ventricle has no  regional wall motion abnormalities. There is moderate concentric left  ventricular hypertrophy. Left ventricular  diastolic parameters are consistent with Grade II diastolic dysfunction  (pseudonormalization). Elevated left atrial pressure.   2. Right ventricular systolic function is normal. The right ventricular  size is normal. There is moderately  elevated pulmonary artery systolic  pressure. The estimated right ventricular systolic pressure is 64.4 mmHg.   3. Left atrial size was severely dilated.   4. Pericardial effusion is unchanged from the preoperative study. The  pericardial effusion is circumferential. There is no evidence of cardiac  tamponade.   5. The mitral valve is degenerative. Mild to moderate mitral valve  regurgitation.  No evidence of mitral stenosis. Moderate mitral annular  calcification.   6. Tricuspid valve regurgitation is mild to moderate.   7. Aortic prosthesis gradients are exaggerated by hyperdynamic left  ventricular function. The aortic valve has been repaired/replaced. Aortic  valve regurgitation is not visualized. There is a 23 mm Edwards Sapien  prosthetic (TAVR) valve present in the  aortic position. Procedure Date: 11/12/21. Echo findings are consistent with  normal structure and function of the aortic valve prosthesis. Aortic valve  mean gradient measures 23.0 mmHg. Aortic valve Vmax measures 3.22 m/s.  Aortic valve acceleration time  measures 74 msec.   8. The inferior vena cava is dilated in size with >50% respiratory  variability, suggesting right atrial pressure of 8 mmHg.   ____________________  Echo 12/13/21 IMPRESSIONS  1. Speckled pattern in the LV raising suspision for cardiac amyloid. Recommend futher diagnostic testing with Cardiac MR or PYP scan. Left ventricular ejection fraction, by estimation, is 55 to 60%. The left ventricle has normal function. The left  ventricle has no regional wall motion abnormalities. There is severe asymmetric left ventricular hypertrophy of the septal segment. Left ventricular diastolic function could not be evaluated. Elevated left atrial pressure.  2. Right ventricular systolic function is normal. The right ventricular size is normal. There is severely elevated pulmonary artery systolic pressure.  3. Left atrial size was severely dilated.  4. Right atrial size was severely dilated.  5. The mitral valve is normal in structure. Moderate mitral valve regurgitation. No evidence of mitral stenosis. Moderate mitral annular calcification.  6. Tricuspid valve regurgitation is moderate to severe.  7. The aortic valve has been repaired/replaced. Aortic valve regurgitation is not visualized. No aortic stenosis is present. Aortic prosthesis gradient slight  elevated but jet contour remain early peaking and triangular consistent with a normal aortic  prosthetic valve. There is a 23 mm Sapien prosthetic (TAVR) valve present in the aortic position. Procedure Date: 11/12/2021. Aortic valve mean gradient measures 20.3 mmHg. Aortic valve Vmax measures 3.28 m/s.  8. The inferior vena cava is normal in size with greater than 50% respiratory variability, suggesting right atrial pressure of 3 mmHg.   EKG:  EKG is NOT ordered today.    Recent Labs: 02/19/2021: TSH 2.34 11/08/2021: ALT 18; B Natriuretic Peptide >4,500.0 11/13/2021: BUN 30; Creatinine, Ser 1.32; Hemoglobin 11.4; Platelets 80; Potassium 3.8; Sodium 143  Recent Lipid Panel    Component Value Date/Time   CHOL 149 12/09/2017 1457   TRIG 57.0 12/09/2017 1457   HDL 72.30 12/09/2017 1457   CHOLHDL 2 12/09/2017 1457   VLDL 11.4 12/09/2017 1457   LDLCALC 65 12/09/2017 1457     Risk Assessment/Calculations:       Physical Exam:    VS:  BP (!) 112/58    Pulse (!) 57    Ht '5\' 6"'$  (1.676 m)    Wt 145 lb (65.8 kg)    SpO2 98%    BMI 23.40 kg/m     Wt Readings from Last 3 Encounters:  12/13/21 145 lb (65.8 kg)  12/06/21 149 lb 14.6 oz (68 kg)  11/20/21 150  lb (68 kg)     GEN:  Well nourished, well developed in no acute distress HEENT: Normal NECK: No JVD LYMPHATICS: No lymphadenopathy CARDIAC: RRR, very soft flow murmur. No rubs, gallops RESPIRATORY:  Clear to auscultation without rales, wheezing or rhonchi  ABDOMEN: Soft, non-tender, non-distended MUSCULOSKELETAL:  No edema; No deformity  SKIN: Warm and dry.   NEUROLOGIC:  Alert and oriented x 3 PSYCHIATRIC:  Normal affect   ASSESSMENT:    1. S/P TAVR (transcatheter aortic valve replacement)   2. Essential hypertension   3. Chronic diastolic CHF (congestive heart failure) ([place])   4. CAD S/P percutaneous coronary angioplasty   5. LVH (left ventricular hypertrophy)     PLAN:    In order of problems listed above:  Severe LFLG  AS/ modAI s/p TAVR: echo today shows EF 65%, normally functioning TAVR with a mean gradient of 20.3 mm hg and no PVL. There is mod MR/ mod-severe TR . (Gradients are elevated but actually improved from POD1 echo). He has NYHA class I symptoms with a big clinical improvement since TAVR. Continue on aspirin alone. He has amoxicillin for SBE prophylaxis. I wil see him back in 6 months for follow up.    Hypertension: currently on Lasix '40mg'$  daily, Lopressor '25mg'$  BID, Norvasc '5mg'$  daily, and hydralazine '25mg'$  BID. BP well controlled today. Of note, we have avoided ACE/ARBs as he has had issues with hyperkalemia with this class of medications and he had worsening LE edema with Norvasc '10mg'$ . Check BMET with recent increase in lasix.    Chronic diastolic CHF: had worsening LE edema and norvasc cut back and lasix increased to '40mg'$  daily. Kdur 10 meq daily added. BMET today  CAD: s/p remote PCI of the RCA and left circumflex in 1999. Recent cath with stable disease. No anginal symptoms. Will continue with medical therapy for CAD.    Left ventricular hypertrophy with septal hypertrophy and LV outflow tract obstruction: felt to possibly have amyloid however but decided to not pursue workup given advanced age. Echo today c/w amyloid.      Medication Adjustments/Labs and Tests Ordered: Current medicines are reviewed at length with the patient today.  Concerns regarding medicines are outlined above.  No orders of the defined types were placed in this encounter.  No orders of the defined types were placed in this encounter.   Patient Instructions  Medication Instructions:  Your physician recommends that you continue on your current medications as directed. Please refer to the Current Medication list given to you today.  *If you need a refill on your cardiac medications before your next appointment, please call your pharmacy*  Lab Work: If you have labs (blood work) drawn today and your tests are completely  normal, you will receive your results only by: [place] (if you have MyChart) OR A paper copy in the mail If you have any lab test that is abnormal or we need to change your treatment, we will call you to review the results.  Testing/Procedures: None ordered today.  Follow-Up: At [facility], you and your health needs are our priority.  As part of our continuing mission to provide you with exceptional heart care, we have created designated Provider Care Teams.  These Care Teams include your primary Cardiologist (physician) and Advanced Practice Providers (APPs -  Physician Assistants and Nurse Practitioners) who all work together to provide you with the care you need, when you need it.  We recommend signing up for the patient portal called "  MyChart".  Sign up information is provided on this After Visit Summary.  MyChart is used to connect with patients for Virtual Visits (Telemedicine).  Patients are able to view lab/test results, encounter notes, upcoming appointments, etc.  Non-urgent messages can be sent to your provider as well.   To learn more about what you can do with MyChart, go to [email or web address].    Your next appointment:   6 month(s)  The format for your next appointment:   In Person  Provider:   Nell Range, PA-C{    Signed, Angelena Form, PA-C  12/13/2021 11:32 AM    [place]

## 2021-12-12 DIAGNOSIS — L308 Other specified dermatitis: Secondary | ICD-10-CM | POA: Diagnosis not present

## 2021-12-12 DIAGNOSIS — Z85828 Personal history of other malignant neoplasm of skin: Secondary | ICD-10-CM | POA: Diagnosis not present

## 2021-12-12 DIAGNOSIS — L0889 Other specified local infections of the skin and subcutaneous tissue: Secondary | ICD-10-CM | POA: Diagnosis not present

## 2021-12-12 DIAGNOSIS — I8312 Varicose veins of left lower extremity with inflammation: Secondary | ICD-10-CM | POA: Diagnosis not present

## 2021-12-12 DIAGNOSIS — I8311 Varicose veins of right lower extremity with inflammation: Secondary | ICD-10-CM | POA: Diagnosis not present

## 2021-12-12 DIAGNOSIS — I872 Venous insufficiency (chronic) (peripheral): Secondary | ICD-10-CM | POA: Diagnosis not present

## 2021-12-13 ENCOUNTER — Ambulatory Visit (HOSPITAL_COMMUNITY): Payer: Medicare Other | Attending: Cardiology

## 2021-12-13 ENCOUNTER — Ambulatory Visit (INDEPENDENT_AMBULATORY_CARE_PROVIDER_SITE_OTHER): Payer: Medicare Other | Admitting: Physician Assistant

## 2021-12-13 ENCOUNTER — Other Ambulatory Visit: Payer: Medicare Other | Admitting: *Deleted

## 2021-12-13 ENCOUNTER — Other Ambulatory Visit: Payer: Self-pay

## 2021-12-13 VITALS — BP 112/58 | HR 57 | Ht 66.0 in | Wt 145.0 lb

## 2021-12-13 DIAGNOSIS — Z9861 Coronary angioplasty status: Secondary | ICD-10-CM | POA: Diagnosis not present

## 2021-12-13 DIAGNOSIS — I350 Nonrheumatic aortic (valve) stenosis: Secondary | ICD-10-CM | POA: Diagnosis not present

## 2021-12-13 DIAGNOSIS — R600 Localized edema: Secondary | ICD-10-CM | POA: Diagnosis not present

## 2021-12-13 DIAGNOSIS — Z0189 Encounter for other specified special examinations: Secondary | ICD-10-CM | POA: Diagnosis not present

## 2021-12-13 DIAGNOSIS — I2510 Atherosclerotic heart disease of native coronary artery without angina pectoris: Secondary | ICD-10-CM | POA: Diagnosis not present

## 2021-12-13 DIAGNOSIS — I517 Cardiomegaly: Secondary | ICD-10-CM | POA: Diagnosis not present

## 2021-12-13 DIAGNOSIS — Z952 Presence of prosthetic heart valve: Secondary | ICD-10-CM

## 2021-12-13 DIAGNOSIS — I10 Essential (primary) hypertension: Secondary | ICD-10-CM | POA: Diagnosis not present

## 2021-12-13 DIAGNOSIS — I5032 Chronic diastolic (congestive) heart failure: Secondary | ICD-10-CM

## 2021-12-13 DIAGNOSIS — Z79899 Other long term (current) drug therapy: Secondary | ICD-10-CM

## 2021-12-13 LAB — BASIC METABOLIC PANEL
BUN/Creatinine Ratio: 25 — ABNORMAL HIGH (ref 10–24)
BUN: 25 mg/dL (ref 10–36)
CO2: 22 mmol/L (ref 20–29)
Calcium: 9.5 mg/dL (ref 8.6–10.2)
Chloride: 105 mmol/L (ref 96–106)
Creatinine, Ser: 1.01 mg/dL (ref 0.76–1.27)
Glucose: 83 mg/dL (ref 70–99)
Potassium: 5 mmol/L (ref 3.5–5.2)
Sodium: 139 mmol/L (ref 134–144)
eGFR: 70 mL/min/{1.73_m2} (ref >59)

## 2021-12-13 LAB — ECHOCARDIOGRAM COMPLETE
AR max vel: 1.2 cm2
AV Area VTI: 1.28 cm2
AV Area mean vel: 1.32 cm2
AV Mean grad: 20.3 mmHg
AV Peak grad: 43 mmHg
Ao pk vel: 3.28 m/s
Area-P 1/2: 3.81 cm2
S' Lateral: 3 cm

## 2021-12-13 NOTE — Patient Instructions (Addendum)
Medication Instructions:  ?Your physician recommends that you continue on your current medications as directed. Please refer to the Current Medication list given to you today. ? ?*If you need a refill on your cardiac medications before your next appointment, please call your pharmacy* ? ?Lab Work: ?If you have labs (blood work) drawn today and your tests are completely normal, you will receive your results only by: ?MyChart Message (if you have MyChart) OR ?A paper copy in the mail ?If you have any lab test that is abnormal or we need to change your treatment, we will call you to review the results. ? ?Testing/Procedures: ?None ordered today. ? ?Follow-Up: ?At [facility], you and your health needs are our priority.  As part of our continuing mission to provide you with exceptional heart care, we have created designated Provider Care Teams.  These Care Teams include your primary Cardiologist (physician) and Advanced Practice Providers (APPs -  Physician Assistants and Nurse Practitioners) who all work together to provide you with the care you need, when you need it. ? ?We recommend signing up for the patient portal called "MyChart".  Sign up information is provided on this After Visit Summary.  MyChart is used to connect with patients for Virtual Visits (Telemedicine).  Patients are able to view lab/test results, encounter notes, upcoming appointments, etc.  Non-urgent messages can be sent to your provider as well.   ?To learn more about what you can do with MyChart, go to [email or web address].   ? ?Your next appointment:   ?6 month(s) ? ?The format for your next appointment:   ?In Person ? ?Provider:   ?Nell Range, PA-C{ ? ?

## 2021-12-16 ENCOUNTER — Encounter (HOSPITAL_COMMUNITY): Payer: Self-pay

## 2021-12-16 ENCOUNTER — Ambulatory Visit: Payer: Medicare Other | Admitting: Emergency Medicine

## 2021-12-16 ENCOUNTER — Telehealth: Payer: Self-pay

## 2021-12-16 DIAGNOSIS — Z0189 Encounter for other specified special examinations: Secondary | ICD-10-CM

## 2021-12-16 DIAGNOSIS — Z952 Presence of prosthetic heart valve: Secondary | ICD-10-CM

## 2021-12-16 DIAGNOSIS — R600 Localized edema: Secondary | ICD-10-CM

## 2021-12-16 DIAGNOSIS — Z79899 Other long term (current) drug therapy: Secondary | ICD-10-CM

## 2021-12-16 NOTE — Telephone Encounter (Signed)
Attempted to call patient in regards to Cardiac Rehab - LM on VM Mailed letter 

## 2021-12-16 NOTE — Telephone Encounter (Signed)
-----   Message from Eileen Stanford, PA-C sent at 12/14/2021  7:47 PM EST ----- ?Labs look great. Potassium is still in normal range but on high end. Lets stop his Kdur. Only take it as needed if he requires extra lasix.  ?

## 2021-12-17 ENCOUNTER — Telehealth (HOSPITAL_COMMUNITY): Payer: Self-pay

## 2021-12-17 NOTE — Telephone Encounter (Signed)
Called pt to see if he was interested in scheduling for cardiac rehab, pt stated that he was unable to participate at this time. I advised patient if anything changes to call back and schedule. ?Closed referral. ?

## 2021-12-19 ENCOUNTER — Telehealth: Payer: Medicare Other

## 2022-01-20 ENCOUNTER — Other Ambulatory Visit: Payer: Self-pay | Admitting: Physician Assistant

## 2022-01-20 ENCOUNTER — Telehealth: Payer: Self-pay | Admitting: Internal Medicine

## 2022-01-20 NOTE — Telephone Encounter (Signed)
Left message for patient to call back to schedule Medicare Annual Wellness Visit  ? ?Last AWV  01/16/21 ? ?Please schedule at anytime with [facility] [place] if patient calls the office back.   ? ? ? ?Any questions, please call me at [phone]  ?

## 2022-01-21 NOTE — Telephone Encounter (Signed)
Please call pt to schedule or reschedule AWV. Thanks  

## 2022-01-22 NOTE — Telephone Encounter (Signed)
Called patient back and LM to call me at [phone] to schedule AWV. ? ?

## 2022-01-27 ENCOUNTER — Telehealth: Payer: Medicare Other

## 2022-01-27 ENCOUNTER — Ambulatory Visit: Payer: Medicare Other

## 2022-01-27 ENCOUNTER — Telehealth: Payer: Self-pay

## 2022-01-27 NOTE — Telephone Encounter (Signed)
Called patient and left message , patient to call back reschedule. ? ?L.Dauntae Derusha,LPN ?

## 2022-01-27 NOTE — Progress Notes (Deleted)
Chronic Care Management Pharmacy Note  01/27/2022 Name:  Paul Mays MRN:  [id number] DOB:  15-Feb-1928  Summary: -Pt reports occasional lightheadedness still, he denies losing balance or falls. He cannot check his BP at home due to shoulder issues. -Pt is using Aleve for shoulder arthritis - reports it is the only thing that helps (steroid injection did not help)  Recommendations/Changes made from today's visit: -Counseled to limit Aleve use as much as possible -Advised to get flu shot at pharmacy   Subjective: Paul Mays is an 88 y.o. year old male who is a primary patient of Burns, Claudina Lick, MD.  The CCM team was consulted for assistance with disease management and care coordination needs.    Engaged with patient by telephone for follow up visit in response to provider referral for pharmacy case management and/or care coordination services.   Consent to Services:  The patient was given information about Chronic Care Management services, agreed to services, and gave verbal consent prior to initiation of services.  Please see initial visit note for detailed documentation.   Patient Care Team: Binnie Rail, MD as PCP - General (Internal Medicine) Sherren Mocha, MD as PCP - Cardiology (Cardiology) Charlton Haws, [facility] as Pharmacist (Pharmacist)   Patient lives at home with wife who has dementia. He takes care of his medications and hers.  Recent office visits: 09/09/2021 - Dr. Quay Burow - no changes to medications - f/u in 6 months  - f/u with cardiology regarding recent echo  08/05/2021 - Dr. Quay Burow - echo ordered - no changes to medications   Recent consult visits: 12/16/2021 - [place] provider - ear irrigation  12/13/2021 - Angelena Form - PA-C - cardiology - 1 month s/p TAVR - no changes to medications - f/u in 6 months  11/20/2021 - Angelena Form - Cardiology - amlodipine increased to 31m daily, rx'd amoxicillin  09/12/2021 - Dr. CBurt Knack- Cardiology [facility] - ED visit -Cat bite  - no changes to medications on discharge  11/12/2021 - TAVR procedure  10/02/2021 - Right/Left Heart Cath  Objective:  Lab Results  Component Value Date   CREATININE 1.01 12/13/2021   BUN 25 12/13/2021   GFR 57.34 (L) 08/05/2021   GFRNONAA 51 (L) 11/13/2021   GFRAA 58 (L) 06/25/2020   NA 139 12/13/2021   K 5.0 12/13/2021   CALCIUM 9.5 12/13/2021   CO2 22 12/13/2021   GLUCOSE 83 12/13/2021    Lab Results  Component Value Date/Time   HGBA1C 4.9 11/08/2021 10:20 AM   HGBA1C 5.1 09/24/2015 11:08 AM   GFR 57.34 (L) 08/05/2021 04:02 PM   GFR 63.61 02/19/2021 02:34 PM    Last diabetic Eye exam: No results found for: HMDIABEYEEXA  Last diabetic Foot exam: No results found for: HMDIABFOOTEX   Lab Results  Component Value Date   CHOL 149 12/09/2017   HDL 72.30 12/09/2017   LDLCALC 65 12/09/2017   TRIG 57.0 12/09/2017   CHOLHDL 2 12/09/2017       Latest Ref Rng & Units 11/08/2021   10:20 AM 08/05/2021    4:02 PM 02/19/2021    2:34 PM  Hepatic Function  Total Protein 6.5 - 8.1 g/dL 6.3   6.7   6.0    Albumin 3.5 - 5.0 g/dL 3.9   4.2   4.2    AST 15 - 41 U/L _0 ALT 0 -  44 U/L _0 Alk Phosphatase 38 - 126 U/L 47   41   31    Total Bilirubin 0.3 - 1.2 mg/dL 2.9   2.5   2.3      Lab Results  Component Value Date/Time   TSH 2.34 02/19/2021 02:34 PM   TSH 1.97 09/24/2015 11:08 AM       Latest Ref Rng & Units 11/13/2021    3:20 AM 11/12/2021    9:50 AM 11/12/2021    8:02 AM  CBC  WBC 4.0 - 10.5 K/uL 5.5      Hemoglobin 13.0 - 17.0 g/dL 11.4   11.6   11.2    Hematocrit 39.0 - 52.0 % 34.7   34.0   33.0    Platelets 150 - 400 K/uL 80        No results found for: VD25OH  Clinical ASCVD: Yes  The ASCVD Risk score (Arnett DK, et al., 2019) failed to calculate for the following reasons:   The 2019 ASCVD risk score is only valid for ages 22 to 52       01/16/2021    3:45 PM 08/21/2020    3:15 PM  08/17/2019    1:54 PM  Depression screen PHQ 2/9  Decreased Interest 0 0 0  Down, Depressed, Hopeless 0 0 0  PHQ - 2 Score 0 0 0     Social History   Tobacco Use  Smoking Status Never  Smokeless Tobacco Never   BP Readings from Last 3 Encounters:  12/13/21 (!) 112/58  12/06/21 (!) 182/81  11/20/21 (!) 152/60   Pulse Readings from Last 3 Encounters:  12/13/21 (!) 57  12/06/21 75  11/20/21 (!) 51   Wt Readings from Last 3 Encounters:  12/13/21 145 lb (65.8 kg)  12/06/21 149 lb 14.6 oz (68 kg)  11/20/21 150 lb (68 kg)   BMI Readings from Last 3 Encounters:  12/13/21 23.40 kg/m  12/06/21 24.95 kg/m  11/20/21 24.96 kg/m    Assessment/Interventions: Review of patient past medical history, allergies, medications, health status, including review of consultants reports, laboratory and other test data, was performed as part of comprehensive evaluation and provision of chronic care management services.   SDOH:  (Social Determinants of Health) assessments and interventions performed: Yes   SDOH Screenings   Alcohol Screen: Not on file  Depression (PHQ2-9): Not on file  Financial Resource Strain: Not on file  Food Insecurity: Not on file  Housing: Not on file  Physical Activity: Not on file  Social Connections: Not on file  Stress: Not on file  Tobacco Use: Low Risk    Smoking Tobacco Use: Never   Smokeless Tobacco Use: Never   Passive Exposure: Not on file  Transportation Needs: Not on file    [place]  Allergies  Allergen Reactions   Lisinopril Other (See Comments)    Hyperkalemia in the context of combined spironolactone and lisinopril therapy   Spironolactone Other (See Comments)    Hyperkalemia in context of spironolactone and lisinopril therapy    Medications Reviewed Today     Reviewed by Jacinta Shoe, [place] (Certified Medical Assistant) on 12/13/21 at [place] List Status: <None>   Medication Order Taking? Sig Documenting Provider Last Dose  Status Informant  allopurinol (ZYLOPRIM) 300 MG tablet [id number] Yes TAKE 1 TABLET(300 MG) BY MOUTH DAILY AS NEEDED FOR GOUT FLARE Burns, Claudina Lick, MD Taking Active Self  amLODipine (NORVASC)  5 MG tablet [id number] Yes Take 1 tablet (5 mg total) by mouth daily. Eileen Stanford, PA-C Taking Active   amoxicillin (AMOXIL) 500 MG tablet [id number] Yes Take 4 tablets (2,000 mg total) by mouth as directed. 1 HOUR PRIOR TO DENTAL APPOINTMENTS Eileen Stanford, PA-C Taking Active   aspirin EC 81 MG tablet [id number] Yes Take 1 tablet (81 mg total) by mouth daily. Darreld Mclean, PA-C Taking Active Self  Cholecalciferol (VITAMIN D3) 50 MCG (2000 UT) capsule [id number] Yes Take 2,000 Units by mouth daily. [provider] Taking Active Self  furosemide (LASIX) 40 MG tablet [id number] Yes Take 1 tablet (40 mg total) by mouth daily. Eileen Stanford, PA-C Taking Active   gabapentin (NEURONTIN) 600 MG tablet [id number] Yes Take 1 tablet (600 mg total) by mouth 2 (two) times daily. Binnie Rail, MD Taking Active Self  hydrALAZINE (APRESOLINE) 25 MG tablet [id number] Yes Take 1 tablet (25 mg total) by mouth in the morning and at bedtime. Eileen Stanford, PA-C Taking Active   metoprolol tartrate (LOPRESSOR) 25 MG tablet [id number] Yes Take 1 tablet (25 mg total) by mouth 2 (two) times daily. Binnie Rail, MD Taking Active Self  potassium chloride (KLOR-CON M) 10 MEQ tablet [id number] Yes Take 1 tablet (10 mEq total) by mouth daily. Eileen Stanford, PA-C Taking Active   pravastatin (PRAVACHOL) 40 MG tablet [id number] Yes Take 40 mg by mouth daily. [provider] Taking Active Self  vitamin B-12 (CYANOCOBALAMIN) 500 MCG tablet [id number] Yes Take 500 mcg by mouth daily. [provider] Taking Active Self            Patient Active Problem List   Diagnosis Date Noted   S/P TAVR (transcatheter aortic valve replacement) 11/12/2021   Dyspnea on exertion 08/05/2021   Acute on  chronic diastolic heart failure ([place]) 08/05/2021   Lower extremity ulceration ([place]) 03/08/2021   Weight loss 02/19/2021   Lightheadedness 00/17/4944   Periumbilical pain 96/75/9163   Fall 08/21/2020   Head trauma, initial encounter 08/21/2020   Bilateral leg edema 01/26/2020   GERD (gastroesophageal reflux disease) 01/26/2020   Chronic pain of both shoulders 12/10/2018   Easy bruising 03/20/2018   Decreased hearing of right ear 09/04/2017   Rotator cuff tear arthropathy of both shoulders 10/18/2015   Thrombocytopenia ([place]) 09/24/2015   Diverticulosis of colon without hemorrhage 07/02/2014   Hypertrophic cardiomyopathy ([place]) 03/24/2011   OBSTRUCTIVE SLEEP APNEA 11/08/2010   HYPERTENSION, PULMONARY 09/16/2010   HEARING LOSS, BILATERAL 06/18/2009   Coronary atherosclerosis 03/07/2009   Severe aortic stenosis 03/07/2009   CAROTID STENOSIS 03/07/2009   Hyperlipidemia 11/15/2008   Peripheral neuropathy, idiopathic 11/15/2008   Essential hypertension 11/15/2008   COLITIS, HX OF 11/15/2008   VENOUS INSUFFICIENCY 04/20/2007   Gout 02/10/2007    Immunization History  Administered Date(s) Administered   Fluad Quad(high Dose 65+) 06/25/2020   Influenza, High Dose Seasonal PF 06/30/2014, 07/04/2017, 06/11/2018, 06/22/2019   Influenza,inj,Quad PF,6+ Mos 07/04/2017   Influenza-Unspecified 07/06/2013, 06/07/2015, 07/08/2016   PFIZER(Purple Top)SARS-COV-2 Vaccination 10/22/2019, 11/11/2019   Pneumococcal Conjugate-13 06/07/2015   Pneumococcal Polysaccharide-23 12/05/2013, 07/08/2016   Td 02/10/2015    Conditions to be addressed/monitored:  Hypertension, Hyperlipidemia, Coronary Artery Disease, Chronic Kidney Disease, Osteoarthritis, and Gout  There are no care plans that you recently modified to display for this patient.  Medication Assistance: None required.  Patient affirms current coverage meets needs.  Patient's preferred pharmacy is: [facility] [phone] [place], [place] -  [address] [place] [address] Phone: [phone] Fax: [place] at [facility] [place] [address] Phone: [phone] Fax: [phone]   Uses pill box? No - prefers bottles Pt endorses 100% compliance  Care Plan and Follow Up Patient Decision:  Patient agrees to Care Plan and Follow-up.  Plan: Telephone follow up appointment with care management team member scheduled for:  6 months  ***

## 2022-01-29 ENCOUNTER — Ambulatory Visit (INDEPENDENT_AMBULATORY_CARE_PROVIDER_SITE_OTHER): Payer: Medicare Other

## 2022-01-29 DIAGNOSIS — Z0000 Encounter for general adult medical examination without abnormal findings: Secondary | ICD-10-CM | POA: Diagnosis not present

## 2022-01-29 NOTE — Patient Instructions (Signed)
Paul Mays , ?Thank you for taking time to come for your Medicare Wellness Visit. I appreciate your ongoing commitment to your health goals. Please review the following plan we discussed and let me know if I can assist you in the future.  ? ?Screening recommendations/referrals: ?Colonoscopy: Not a candidate for screening due to age ?Recommended yearly ophthalmology/optometry visit for glaucoma screening and checkup ?Recommended yearly dental visit for hygiene and checkup ? ?Vaccinations: ?Influenza vaccine: 07/22/2021 ?Pneumococcal vaccine: 06/07/2015, 07/08/2016 ?Tdap vaccine: 02/10/2015; due every 10 years ?Shingles vaccine: never done   ?Covid-19: 10/22/2019, 11/11/2019, 06/25/2020, 07/13/2021 ? ?Advanced directives: Yes; documents on file. ? ?Conditions/risks identified: Yes ? ?Next appointment: Please schedule your next Medicare Wellness Visit with your Nurse Health Advisor in 1 year by calling [phone]. ? ?Preventive Care 86 Years and Older, Male ?Preventive care refers to lifestyle choices and visits with your health care provider that can promote health and wellness. ?What does preventive care include? ?A yearly physical exam. This is also called an annual well check. ?Dental exams once or twice a year. ?Routine eye exams. Ask your health care provider how often you should have your eyes checked. ?Personal lifestyle choices, including: ?Daily care of your teeth and gums. ?Regular physical activity. ?Eating a healthy diet. ?Avoiding tobacco and drug use. ?Limiting alcohol use. ?Practicing safe sex. ?Taking low doses of aspirin every day. ?Taking vitamin and mineral supplements as recommended by your health care provider. ?What happens during an annual well check? ?The services and screenings done by your health care provider during your annual well check will depend on your age, overall health, lifestyle risk factors, and family history of disease. ?Counseling  ?Your health care provider may ask you questions about  your: ?Alcohol use. ?Tobacco use. ?Drug use. ?Emotional well-being. ?Home and relationship well-being. ?Sexual activity. ?Eating habits. ?History of falls. ?Memory and ability to understand (cognition). ?Work and work Statistician. ?Screening  ?You may have the following tests or measurements: ?Height, weight, and BMI. ?Blood pressure. ?Lipid and cholesterol levels. These may be checked every 5 years, or more frequently if you are over 86 years old. ?Skin check. ?Lung cancer screening. You may have this screening every year starting at age 866 if you have a 30-pack-year history of smoking and currently smoke or have quit within the past 15 years. ?Fecal occult blood test (FOBT) of the stool. You may have this test every year starting at age 866. ?Flexible sigmoidoscopy or colonoscopy. You may have a sigmoidoscopy every 5 years or a colonoscopy every 10 years starting at age 86. ?Prostate cancer screening. Recommendations will vary depending on your family history and other risks. ?Hepatitis C blood test. ?Hepatitis B blood test. ?Sexually transmitted disease (STD) testing. ?Diabetes screening. This is done by checking your blood sugar (glucose) after you have not eaten for a while (fasting). You may have this done every 1-3 years. ?Abdominal aortic aneurysm (AAA) screening. You may need this if you are a current or former smoker. ?Osteoporosis. You may be screened starting at age 866 if you are at high risk. ?Talk with your health care provider about your test results, treatment options, and if necessary, the need for more tests. ?Vaccines  ?Your health care provider may recommend certain vaccines, such as: ?Influenza vaccine. This is recommended every year. ?Tetanus, diphtheria, and acellular pertussis (Tdap, Td) vaccine. You may need a Td booster every 10 years. ?Zoster vaccine. You may need this after age 86. ?Pneumococcal 13-valent conjugate (PCV13) vaccine. One dose is recommended  after age 86. ?Pneumococcal  polysaccharide (PPSV23) vaccine. One dose is recommended after age 86. ?Talk to your health care provider about which screenings and vaccines you need and how often you need them. ?This information is not intended to replace advice given to you by your health care provider. Make sure you discuss any questions you have with your health care provider. ?Document Released: 10/19/2015 Document Revised: 06/11/2016 Document Reviewed: 07/24/2015 ?Elsevier Interactive Patient Education ? 2017 [place]. ? ?Fall Prevention in the Home ?Falls can cause injuries. They can happen to people of all ages. There are many things you can do to make your home safe and to help prevent falls. ?What can I do on the outside of my home? ?Regularly fix the edges of walkways and driveways and fix any cracks. ?Remove anything that might make you trip as you walk through a door, such as a raised step or threshold. ?Trim any bushes or trees on the path to your home. ?Use bright outdoor lighting. ?Clear any walking paths of anything that might make someone trip, such as rocks or tools. ?Regularly check to see if handrails are loose or broken. Make sure that both sides of any steps have handrails. ?Any raised decks and porches should have guardrails on the edges. ?Have any leaves, snow, or ice cleared regularly. ?Use sand or salt on walking paths during winter. ?Clean up any spills in your garage right away. This includes oil or grease spills. ?What can I do in the bathroom? ?Use night lights. ?Install grab bars by the toilet and in the tub and shower. Do not use towel bars as grab bars. ?Use non-skid mats or decals in the tub or shower. ?If you need to sit down in the shower, use a plastic, non-slip stool. ?Keep the floor dry. Clean up any water that spills on the floor as soon as it happens. ?Remove soap buildup in the tub or shower regularly. ?Attach bath mats securely with double-sided non-slip rug tape. ?Do not have throw rugs and other  things on the floor that can make you trip. ?What can I do in the bedroom? ?Use night lights. ?Make sure that you have a light by your bed that is easy to reach. ?Do not use any sheets or blankets that are too big for your bed. They should not hang down onto the floor. ?Have a firm chair that has side arms. You can use this for support while you get dressed. ?Do not have throw rugs and other things on the floor that can make you trip. ?What can I do in the kitchen? ?Clean up any spills right away. ?Avoid walking on wet floors. ?Keep items that you use a lot in easy-to-reach places. ?If you need to reach something above you, use a strong step stool that has a grab bar. ?Keep electrical cords out of the way. ?Do not use floor polish or wax that makes floors slippery. If you must use wax, use non-skid floor wax. ?Do not have throw rugs and other things on the floor that can make you trip. ?What can I do with my stairs? ?Do not leave any items on the stairs. ?Make sure that there are handrails on both sides of the stairs and use them. Fix handrails that are broken or loose. Make sure that handrails are as long as the stairways. ?Check any carpeting to make sure that it is firmly attached to the stairs. Fix any carpet that is loose or worn. ?Avoid having throw  rugs at the top or bottom of the stairs. If you do have throw rugs, attach them to the floor with carpet tape. ?Make sure that you have a light switch at the top of the stairs and the bottom of the stairs. If you do not have them, ask someone to add them for you. ?What else can I do to help prevent falls? ?Wear shoes that: ?Do not have high heels. ?Have rubber bottoms. ?Are comfortable and fit you well. ?Are closed at the toe. Do not wear sandals. ?If you use a stepladder: ?Make sure that it is fully opened. Do not climb a closed stepladder. ?Make sure that both sides of the stepladder are locked into place. ?Ask someone to hold it for you, if possible. ?Clearly  mark and make sure that you can see: ?Any grab bars or handrails. ?First and last steps. ?Where the edge of each step is. ?Use tools that help you move around (mobility aids) if they are needed. These include: ?

## 2022-01-29 NOTE — Progress Notes (Signed)
?I connected with Deetta Perla today by telephone and verified that I am speaking with the correct person using two identifiers. ?Location patient: home ?Location provider: work ?Persons participating in the virtual visit: patient, provider. ?  ?I discussed the limitations, risks, security and privacy concerns of performing an evaluation and management service by telephone and the availability of in person appointments. I also discussed with the patient that there may be a patient responsible charge related to this service. The patient expressed understanding and verbally consented to this telephonic visit.  ?  ?Interactive audio and video telecommunications were attempted between this provider and patient, however failed, due to patient having technical difficulties OR patient did not have access to video capability.  We continued and completed visit with audio only. ? ?Some vital signs may be absent or patient reported.  ? ?Time Spent with patient on telephone encounter: 30 minutes ? ?Subjective:  ? Paul Mays is a 88 y.o. male who presents for Medicare Annual/Subsequent preventive examination. ? ?Review of Systems    ? ?Cardiac Risk Factors include: advanced age (>42mn, >>55women);dyslipidemia;family history of premature cardiovascular disease;hypertension;male gender ? ?   ?Objective:  ?  ?There were no vitals filed for this visit. ?There is no height or weight on file to calculate BMI. ? ? ?  01/29/2022  ? 11:30 AM 12/06/2021  ? 11:04 AM 11/13/2021  ? 12:00 PM 11/12/2021  ?  6:18 AM 11/08/2021  ?  9:58 AM 10/02/2021  ? 10:06 AM 01/29/2021  ?  6:22 PM  ?Advanced Directives  ?Does Patient Have a Medical Advance Directive? Yes Yes  Yes Yes Yes Yes  ?Type of [place] DNR (pink MOST or yellow form);Living will HB and ELiving will Healthcare Power of [place] will [place] will  [place] will   ?Does patient want to make changes to medical advance directive? No - Patient declined No - Patient declined No - Patient declined  No - Patient declined No - Patient declined No - Patient declined  ?Copy of HDelphosin Chart? Yes - validated most recent copy scanned in chart (See row information) No - copy requested  No - copy requested No - copy requested No - copy requested   ?Pre-existing out of facility DNR order (yellow form or pink MOST form) Yellow form placed in chart (order not valid for inpatient use)        ? ? ?Current Medications (verified) ?Outpatient Encounter Medications as of 01/29/2022  ?Medication Sig  ? allopurinol (ZYLOPRIM) 300 MG tablet TAKE 1 TABLET(300 MG) BY MOUTH DAILY AS NEEDED FOR GOUT FLARE  ? amLODipine (NORVASC) 5 MG tablet Take 1 tablet (5 mg total) by mouth daily.  ? amoxicillin (AMOXIL) 500 MG tablet Take 4 tablets (2,000 mg total) by mouth as directed. 1 HOUR PRIOR TO DENTAL APPOINTMENTS  ? aspirin EC 81 MG tablet Take 1 tablet (81 mg total) by mouth daily.  ? Cholecalciferol (VITAMIN D3) 50 MCG (2000 UT) capsule Take 2,000 Units by mouth daily.  ? furosemide (LASIX) 40 MG tablet Take 1 tablet (40 mg total) by mouth daily.  ? gabapentin (NEURONTIN) 600 MG tablet Take 1 tablet (600 mg total) by mouth 2 (two) times daily.  ? hydrALAZINE (APRESOLINE) 25 MG tablet TAKE 1 TABLET(25 MG) BY MOUTH IN THE MORNING AND AT BEDTIME  ? metoprolol tartrate (LOPRESSOR) 25 MG tablet Take 1  tablet (25 mg total) by mouth 2 (two) times daily.  ? potassium chloride (KLOR-CON M) 10 MEQ tablet Take 10 mEq by mouth as needed. Only take if you use extra Lasix  ? pravastatin (PRAVACHOL) 40 MG tablet Take 40 mg by mouth daily.  ? vitamin B-12 (CYANOCOBALAMIN) 500 MCG tablet Take 500 mcg by mouth daily.  ? ?No facility-administered encounter medications on file as of 01/29/2022.  ? ? ?Allergies (verified) ?Lisinopril and Spironolactone   ? ?History: ?Past Medical History:  ?Diagnosis Date  ? Anxiety   ? Aortic stenosis   ? Arthralgia   ? Arthritis   ? CAD (coronary artery disease)   ? Carotid stenosis   ? Cervicalgia   ? CLUSTER HEADACHE 02/10/2007  ? Qualifier: Diagnosis of  By: Linna Darner MD, Gwyndolyn Saxon    ? Colitis   ? Edema   ? Gout   ? Hearing loss   ? Herpes zoster without mention of complication   ? Hyperlipidemia   ? Hypertension   ? Hyperuricemia   ? Impacted cerumen   ? Migraine   ? Migraine   ? Murmur   ? Olecranon bursitis   ? OSA (obstructive sleep apnea)   ? Other and unspecified hyperlipidemia   ? Other malaise and fatigue   ? Peripheral neuropathy   ? Polyneuropathy   ? Prostate cancer ([place])   ? Pulmonary hypertension ([place])   ? S/P TAVR (transcatheter aortic valve replacement) 11/12/2021  ? 93m S3UR via TF approach with Dr. CBurt Knackand Dr. BCyndia Bent ? Unspecified hereditary and idiopathic peripheral neuropathy   ? Venous insufficiency   ? ?Past Surgical History:  ?Procedure Laterality Date  ? ANGIOPLASTY    ? w/ 2 stents in 1999  ? APPENDECTOMY    ? CATARACT EXTRACTION W/ INTRAOCULAR LENS IMPLANT Bilateral   ? HEMORRHOID SURGERY    ? INTRAOPERATIVE TRANSTHORACIC ECHOCARDIOGRAM N/A 11/12/2021  ? Procedure: INTRAOPERATIVE TRANSTHORACIC ECHOCARDIOGRAM;  Surgeon: CSherren Mocha MD;  [facility]  Service: Open Heart Surgery;  Laterality: N/A;  ? LIPOMA EXCISION    ? RIGHT/LEFT HEART CATH AND CORONARY ANGIOGRAPHY N/A 10/02/2021  ? Procedure: RIGHT/LEFT HEART CATH AND CORONARY ANGIOGRAPHY;  Surgeon: CSherren Mocha MD;  [facility];  Service: Cardiovascular;  Laterality: N/A;  ? TONSILLECTOMY    ? TRANSCATHETER AORTIC VALVE REPLACEMENT, TRANSFEMORAL N/A 11/12/2021  ? Procedure: TRANSCATHETER AORTIC VALVE REPLACEMENT, TRANSFEMORAL;  Surgeon: CSherren Mocha MD;  [place] [facility]  Service: Open Heart Surgery;  Laterality: N/A;  ? ULTRASOUND GUIDANCE FOR VASCULAR ACCESS Bilateral 11/12/2021  ? Procedure: ULTRASOUND GUIDANCE FOR  VASCULAR ACCESS;  Surgeon: CSherren Mocha MD;  [facility]  Service: Open Heart Surgery;  Laterality: Bilateral;  ? ?Family History  ?Problem Relation Age of Onset  ? Stroke Father   ?     Died, 83  ? High blood pressure Father   ? Diabetes Father   ? Other Mother   ?     Died, 88  ? Diabetes Brother   ?     Died, 82  ? Diabetes Sister   ?     Living, 86  ? Healthy Daughter   ? Healthy Son   ? ?Social History  ? ?Socioeconomic History  ? Marital status: Married  ?  Spouse name: SVinnie Level ? Number of children: 2  ? Years of education: college  ? Highest education level: Not on file  ?Occupational History  ? Occupation: retired  ?  Comment: worked  as a memory expert/speaker  ?  Employer: RETIRED  ?Tobacco Use  ? Smoking status: Never  ? Smokeless tobacco: Never  ?Vaping Use  ? Vaping Use: Never used  ?Substance and Sexual Activity  ? Alcohol use: Yes  ?  Alcohol/week: 14.0 standard drinks  ?  Types: 14 Standard drinks or equivalent per week  ?  Comment: 2 drink before dinner vodka   ? Drug use: No  ? Sexual activity: Never  ?Other Topics Concern  ? Not on file  ?Social History Narrative  ? Pt is married and lives with wife (suzanne). Patient finished  two years of college.   ? Pt has children.  ? Caffeine -two cups daily.  ? Left handed.  ? Retired Licensed conveyancer  ?     ?   ?   ?   ?   ? ?Social Determinants of Health  ? ?Financial Resource Strain: Low Risk   ? Difficulty of Paying Living Expenses: Not hard at all  ?Food Insecurity: No Food Insecurity  ? Worried About Charity fundraiser in the Last Year: Never true  ? Ran Out of Food in the Last Year: Never true  ?Transportation Needs: No Transportation Needs  ? Lack of Transportation (Medical): No  ? Lack of Transportation (Non-Medical): No  ?Physical Activity: Sufficiently Active  ? Days of Exercise per Week: 5 days  ? Minutes of Exercise per Session: 30 min  ?Stress: No Stress Concern Present  ? Feeling of Stress : Not at all  ?Social Connections: Socially  Integrated  ? Frequency of Communication with Friends and Family: More than three times a week  ? Frequency of Social Gatherings with Friends and Family: More than three times a week  ? Attends Religious Services: More than

## 2022-02-02 ENCOUNTER — Other Ambulatory Visit: Payer: Self-pay | Admitting: Internal Medicine

## 2022-02-11 IMAGING — DX DG SHOULDER 2+V*L*
3 series · 3 of 3 positions shown · non-contrast
Comparison: Prior shoulder series report 12/09/2011.

CLINICAL DATA: Bilateral shoulder pain.

EXAM:
LEFT SHOULDER - 2+ VIEW

[shoulder ap (1 of 2)]
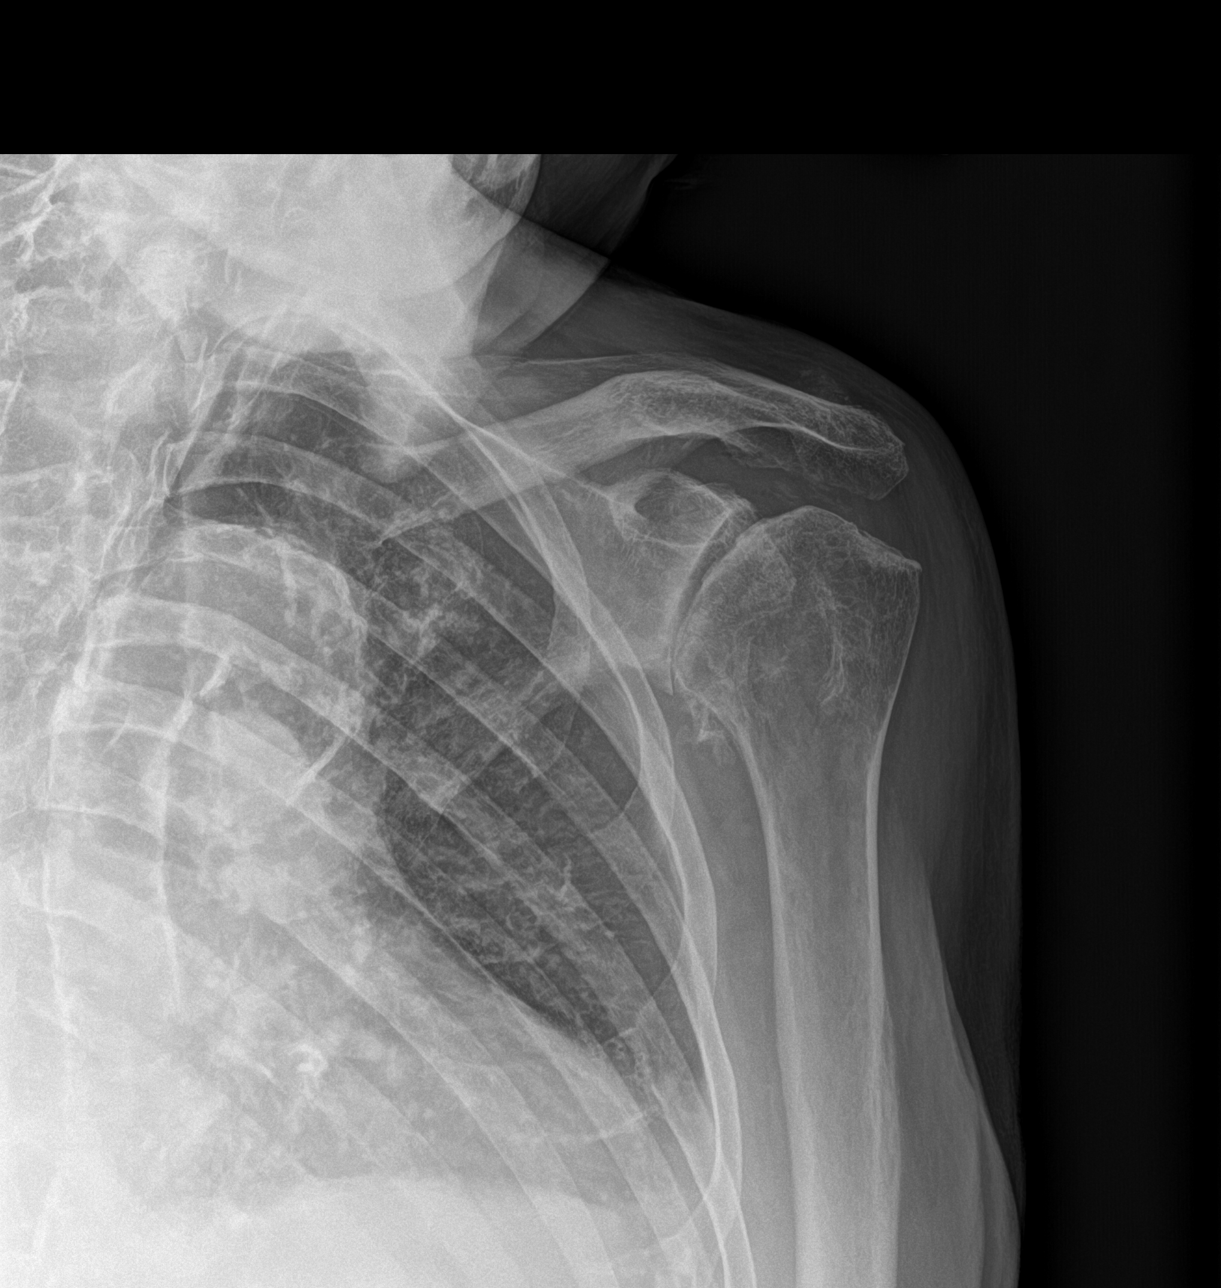

[shoulder ap (2 of 2)]
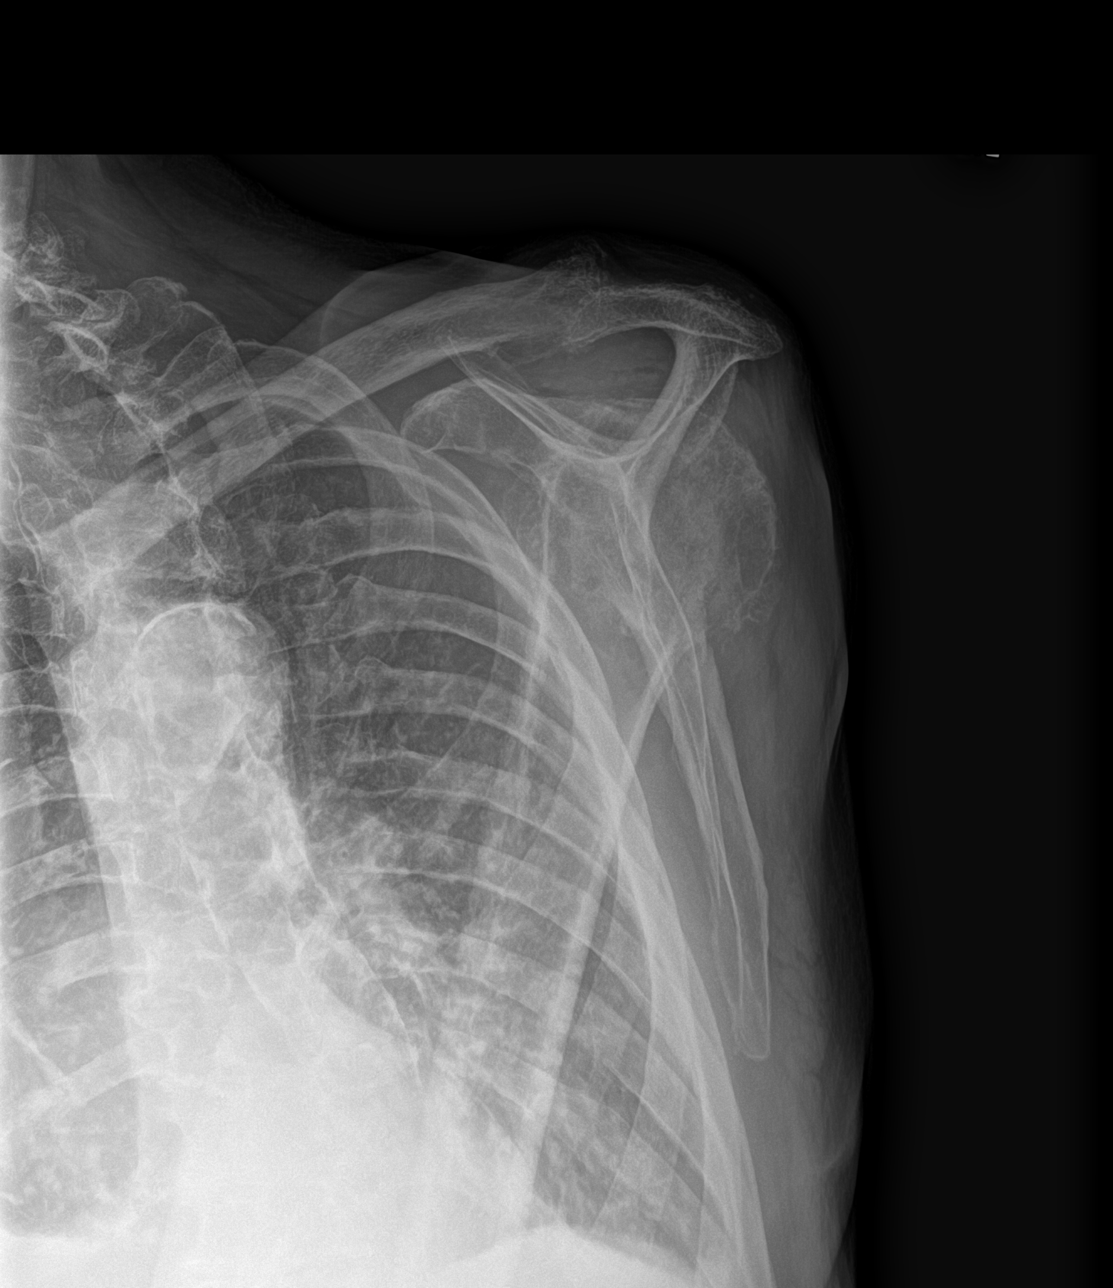

[shoulder axial]
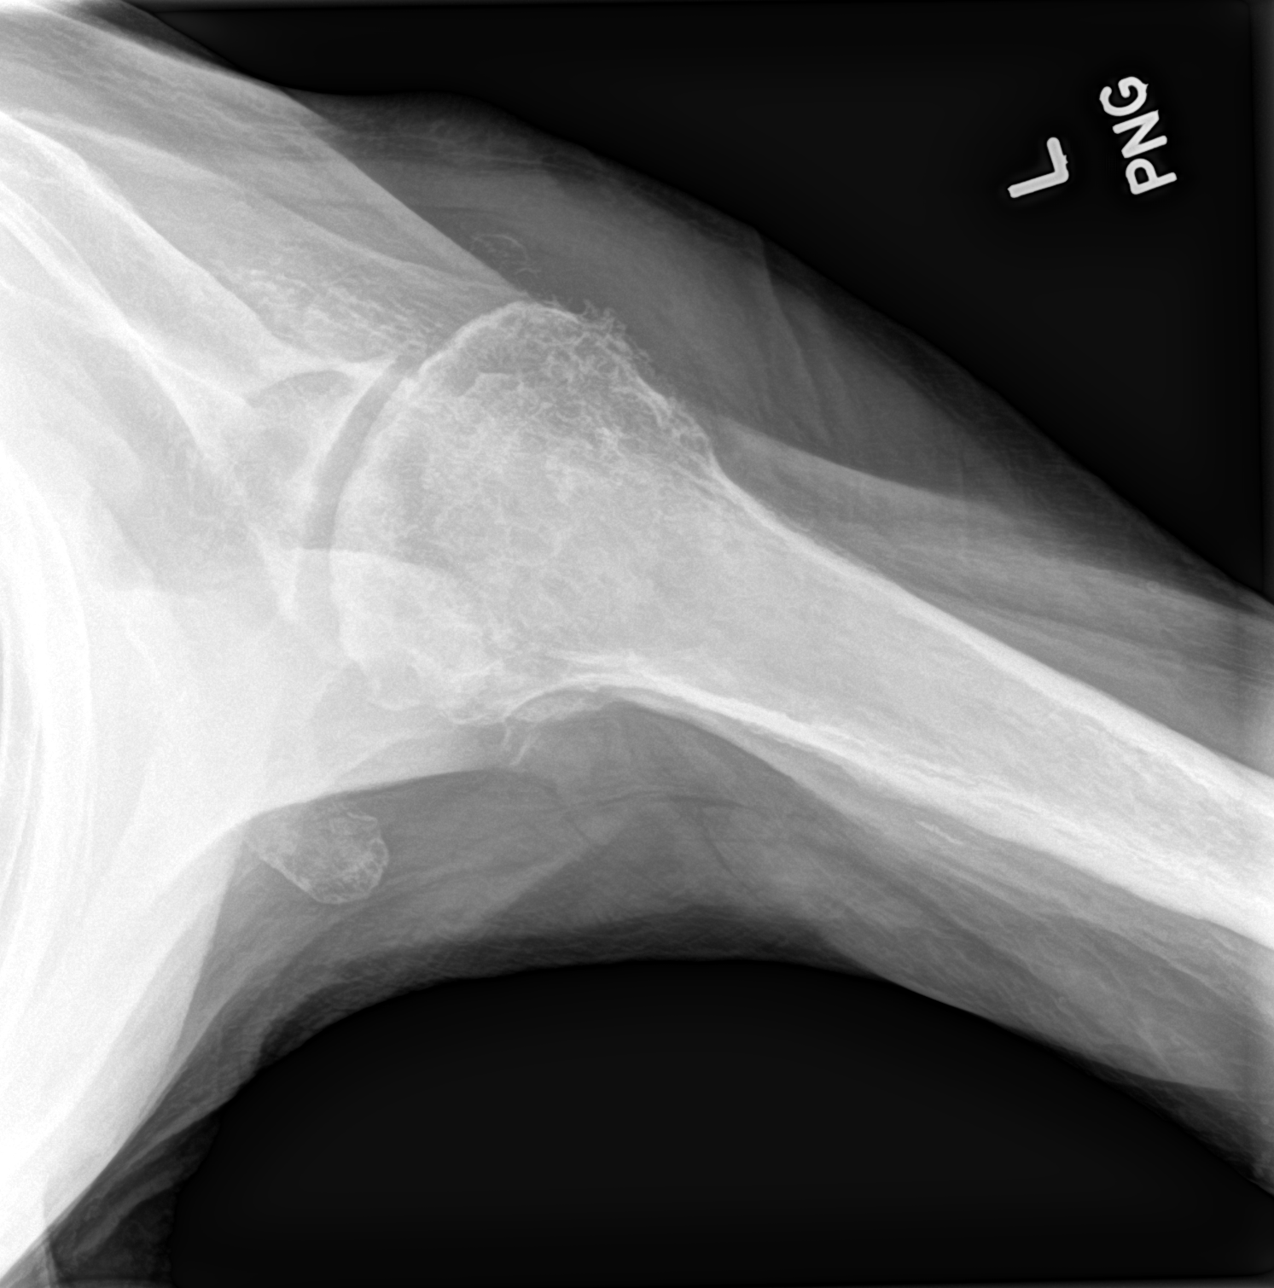

[3 of 3 positions shown; findings below may reference images not displayed]

FINDINGS: Severe acromioclavicular glenohumeral degenerative change. Loose
bodies noted. Calcific supraspinatus tendinosis cannot be excluded.
No evidence of fracture or dislocation. No evidence of separation.
IMPRESSION: Severe acromioclavicular glenohumeral degenerative change with loose
bodies. Calcific supraspinatus tendinosis cannot be excluded. No
acute abnormality identified.

## 2022-03-25 DIAGNOSIS — C4441 Basal cell carcinoma of skin of scalp and neck: Secondary | ICD-10-CM | POA: Diagnosis not present

## 2022-03-25 DIAGNOSIS — D485 Neoplasm of uncertain behavior of skin: Secondary | ICD-10-CM | POA: Diagnosis not present

## 2022-03-25 DIAGNOSIS — Z85828 Personal history of other malignant neoplasm of skin: Secondary | ICD-10-CM | POA: Diagnosis not present

## 2022-03-27 ENCOUNTER — Encounter: Payer: Self-pay | Admitting: Internal Medicine

## 2022-03-27 ENCOUNTER — Ambulatory Visit (INDEPENDENT_AMBULATORY_CARE_PROVIDER_SITE_OTHER): Payer: Medicare Other | Admitting: Internal Medicine

## 2022-03-27 VITALS — BP 126/68 | HR 64 | Temp 97.7°F | Ht 66.0 in | Wt 150.0 lb

## 2022-03-27 DIAGNOSIS — I10 Essential (primary) hypertension: Secondary | ICD-10-CM | POA: Diagnosis not present

## 2022-03-27 DIAGNOSIS — M65351 Trigger finger, right little finger: Secondary | ICD-10-CM | POA: Diagnosis not present

## 2022-03-27 DIAGNOSIS — I2510 Atherosclerotic heart disease of native coronary artery without angina pectoris: Secondary | ICD-10-CM

## 2022-03-27 DIAGNOSIS — Z9861 Coronary angioplasty status: Secondary | ICD-10-CM

## 2022-03-27 NOTE — Patient Instructions (Signed)
     A referral was ordered for hand orthopedics at [facility].     Someone from that office will call you to schedule an appointment.

## 2022-03-27 NOTE — Assessment & Plan Note (Signed)
New He would benefit from possible steroid injection.  Will refer to orthopedics

## 2022-04-01 ENCOUNTER — Ambulatory Visit (INDEPENDENT_AMBULATORY_CARE_PROVIDER_SITE_OTHER): Payer: Medicare Other | Admitting: Orthopedic Surgery

## 2022-04-01 ENCOUNTER — Encounter: Payer: Self-pay | Admitting: Orthopedic Surgery

## 2022-04-01 VITALS — BP 153/68 | HR 72 | Ht 66.0 in | Wt 150.0 lb

## 2022-04-01 DIAGNOSIS — M65331 Trigger finger, right middle finger: Secondary | ICD-10-CM | POA: Diagnosis not present

## 2022-04-01 DIAGNOSIS — M65351 Trigger finger, right little finger: Secondary | ICD-10-CM

## 2022-04-01 MED ORDER — LIDOCAINE HCL 1 % IJ SOLN
1.0000 mL | INTRAMUSCULAR | Status: AC | PRN
  Administered 2022-04-01: 1 mL

## 2022-04-01 MED ORDER — BETAMETHASONE SOD PHOS & ACET 6 (3-3) MG/ML IJ SUSP
6.0000 mg | INTRAMUSCULAR | Status: AC | PRN
  Administered 2022-04-01: 6 mg via INTRA_ARTICULAR

## 2022-04-04 ENCOUNTER — Telehealth: Payer: Self-pay | Admitting: Orthopedic Surgery

## 2022-04-04 NOTE — Telephone Encounter (Signed)
Pt called requesting a call from Dr. Tempie Donning. Pt has medical questions about next step of his fingers. Please call pt at [phone].

## 2022-04-04 NOTE — Telephone Encounter (Signed)
I called and advised patient that he just had the injections 3 days ago, I advised that we have to give them a couple of months to see how they do for him as they take awhile to start working. He states that he understands and that he will wait it out

## 2022-05-07 DIAGNOSIS — Z85828 Personal history of other malignant neoplasm of skin: Secondary | ICD-10-CM | POA: Diagnosis not present

## 2022-05-07 DIAGNOSIS — C4441 Basal cell carcinoma of skin of scalp and neck: Secondary | ICD-10-CM | POA: Diagnosis not present

## 2022-05-13 ENCOUNTER — Ambulatory Visit: Payer: Self-pay | Admitting: Licensed Clinical Social Worker

## 2022-05-13 NOTE — Patient Instructions (Signed)
Visit Information  Thank you for taking time to visit with me today. Please don't hesitate to contact me if I can be of assistance to you before our next scheduled telephone appointment.  Following are the goals we discussed today:    No further follow up is needed at this time.   Please call the care guide team at [phone] if you need to cancel or reschedule your appointment.   If you are experiencing a Mental Health or [place] or need someone to talk to, please go to [facility] [place] [phone])   Following is a copy of your full plan of care:   Care Coordination Interventions:  Depression screen reviewed  Active listening / Reflection utilized  Emotional Support Provided Discussed Care Coordination support services  Reviewed ambulation needs of client Reviewed transport needs of client Reviewed medication procurement of client Reviewed family support of client Client informed LCSW that client did not feel that he needed Care coordination support services at present. Marland KitchenLCSW reminded client of Care coordination support and encouraged him to talk with PCP in future if he feels he needs support through Care Coordination program   Mr. Davtyan was given information about Care Management services by the embedded care coordination team including:  Care Management services include personalized support from designated clinical staff supervised by his physician, including individualized plan of care and coordination with other care providers 24/7 contact phone numbers for assistance for urgent and routine care needs. The patient may stop CCM services at any time (effective at the end of the month) by phone call to the office staff.  Patient agreed to services and verbal consent obtained.   Norva Riffle.Ambra Haverstick MSW, LCSW Licensed Clinical Social Worker [facility] Care Management [phone]

## 2022-05-13 NOTE — Patient Outreach (Signed)
  Care Coordination   Initial Visit Note   05/13/2022 Name: Paul Mays MRN: [id number] DOB: 1928-04-20  Paul Mays is a 88 y.o. year old male who sees Burns, Claudina Lick, MD for primary care. I spoke with  Paul Mays by phone today  What matters to the patients health and wellness today?  Client wants to maintain current health stability. He sees PCP as scheduled; he has strong family support; he drives car to appointments as needed; he has medications and feels that his mood is stable. He said he did not feel that he needed Care Coordination support at this time but is aware it is available if he should need this support in the future.     Goals Addressed             This Visit's Progress    Patient Stated       Care Coordination Interventions:  Depression screen reviewed  Active listening / Reflection utilized  Emotional Support Provided Discussed Care Coordination support services  Reviewed ambulation needs of client Reviewed transport needs of client Reviewed medication procurement of client Reviewed family support of client Client informed LCSW that client did not feel that he needed Care coordination support services at present. Marland KitchenLCSW reminded client of Care coordination support and encouraged him to talk with PCP in future if he feels he needs support through Care Coordination program        SDOH assessments and interventions completed:  Yes   Care Coordination Interventions Activated:  Yes  Care Coordination Interventions:  Yes, provided   Follow up plan: No further intervention required.   Encounter Outcome:  Pt. Visit Completed

## 2022-05-20 ENCOUNTER — Other Ambulatory Visit: Payer: Self-pay | Admitting: Internal Medicine

## 2022-06-09 ENCOUNTER — Encounter: Payer: Self-pay | Admitting: Internal Medicine

## 2022-06-09 NOTE — Progress Notes (Signed)
Subjective:    Patient ID: Paul Mays, male    DOB: 1927-10-25, 88 y.o.   MRN: [id number]      HPI Paul Mays is here for  Chief Complaint  Patient presents with   Neck Pain    Problems with disc (patient states he had surgery before) put pain has come back (he jumped out of bed and pain started)   Years ago he had a ruptured disc for which he had surgery for.  The past couple of days he has neck pain.  The pain is in the lower posterior central neck.  There is no radiating pain into arms, back or head.  The pain is intermittent.  Laying in bed is when he feels it.  He denies N/T or weakness in arms.    He has taken 1 aleve when the pain comes.  He does take 600 mg of gabapentin at night.   Legs are weak when he walks.  He gets tired - he thinks it is his legs than get tired.    He has not slept well the past three nights.  He has been waking up in the night.  He sometimes drifts off to sleep during the day.       Has peripheral neuropathy - saw Dr Posey Pronto in the past.  He has been on gabapentin.      Medications and allergies reviewed with patient and updated if appropriate.  Current Outpatient Medications on File Prior to Visit  Medication Sig Dispense Refill   allopurinol (ZYLOPRIM) 300 MG tablet TAKE 1 TABLET(300 MG) BY MOUTH DAILY AS NEEDED FOR GOUT FLARE 90 tablet 0   amoxicillin (AMOXIL) 500 MG tablet Take 4 tablets (2,000 mg total) by mouth as directed. 1 HOUR PRIOR TO DENTAL APPOINTMENTS 12 tablet 6   aspirin EC 81 MG tablet Take 1 tablet (81 mg total) by mouth daily.     Cholecalciferol (VITAMIN D3) 50 MCG (2000 UT) capsule Take 2,000 Units by mouth daily.     furosemide (LASIX) 40 MG tablet Take 1 tablet (40 mg total) by mouth daily. 90 tablet 2   gabapentin (NEURONTIN) 600 MG tablet TAKE 1 TABLET(600 MG) BY MOUTH TWICE DAILY 180 tablet 1   hydrALAZINE (APRESOLINE) 25 MG tablet TAKE 1 TABLET(25 MG) BY MOUTH IN THE MORNING AND AT BEDTIME 60 tablet 10   metoprolol  tartrate (LOPRESSOR) 25 MG tablet Take 1 tablet (25 mg total) by mouth 2 (two) times daily. 180 tablet 3   potassium chloride (KLOR-CON M) 10 MEQ tablet Take 10 mEq by mouth as needed. Only take if you use extra Lasix     pravastatin (PRAVACHOL) 40 MG tablet Take 40 mg by mouth daily.     vitamin B-12 (CYANOCOBALAMIN) 500 MCG tablet Take 500 mcg by mouth daily.     amLODipine (NORVASC) 5 MG tablet Take 1 tablet (5 mg total) by mouth daily. 180 tablet 3   No current facility-administered medications on file prior to visit.    Review of Systems     Objective:   Vitals:   06/10/22 1444  BP: 124/64  Pulse: 81  Temp: 98 F (36.7 C)  SpO2: 93%   BP Readings from Last 3 Encounters:  06/10/22 124/64  04/01/22 (!) 153/68  03/27/22 126/68   Wt Readings from Last 3 Encounters:  06/10/22 149 lb 12.8 oz (67.9 kg)  04/01/22 150 lb (68 kg)  03/27/22 150 lb (68 kg)   Body mass index  is 24.18 kg/m.    Physical Exam Constitutional:      General: He is not in acute distress.    Appearance: Normal appearance. He is not ill-appearing.  HENT:     Head: Normocephalic and atraumatic.  Musculoskeletal:        General: No tenderness (no tenderness along cervical spine or upper back, posterior neck).  Skin:    Findings: No rash.  Neurological:     Mental Status: He is alert.     Sensory: No sensory deficit.     Motor: No weakness.     Gait: Gait abnormal (poor balance, unsteady).        Reviewed CT cervical spine and MRI cervical spine from years ago    Assessment & Plan:    See Problem List for Assessment and Plan of chronic medical problems.

## 2022-06-10 ENCOUNTER — Ambulatory Visit (INDEPENDENT_AMBULATORY_CARE_PROVIDER_SITE_OTHER): Payer: Medicare Other | Admitting: Internal Medicine

## 2022-06-10 VITALS — BP 124/64 | HR 81 | Temp 98.0°F | Ht 66.0 in | Wt 149.8 lb

## 2022-06-10 DIAGNOSIS — Z9861 Coronary angioplasty status: Secondary | ICD-10-CM

## 2022-06-10 DIAGNOSIS — M542 Cervicalgia: Secondary | ICD-10-CM | POA: Diagnosis not present

## 2022-06-10 DIAGNOSIS — I2510 Atherosclerotic heart disease of native coronary artery without angina pectoris: Secondary | ICD-10-CM | POA: Diagnosis not present

## 2022-06-10 DIAGNOSIS — G8929 Other chronic pain: Secondary | ICD-10-CM | POA: Insufficient documentation

## 2022-06-10 MED ORDER — GABAPENTIN 600 MG PO TABS: 600.0000 mg | ORAL_TABLET | Freq: Every day | ORAL | 1 refills | Status: DC

## 2022-06-10 NOTE — Patient Instructions (Addendum)
      Medications changes include :   none   A Ct scan of your neck was ordered.    Return if symptoms worsen or fail to improve.

## 2022-06-10 NOTE — Assessment & Plan Note (Signed)
Acute Started having posterior lower cervical spine pain a couple of days ago-only hurts him at night, but has woken him up and then moderate pain in nature No radiating symptoms, no numbness, tingling or weakness in the arms He does have weakness in his legs could be multifactorial, but?  Neurogenic claudication History of cervical spine surgery years ago for ruptured disc Has known multilevel degenerative changes, spondylolisthesis We will obtain CT scan of the neck He would like to hold off on referral to see a specialist until we get the results of the CT scan Symptomatic treatment with OTC medications He will let me know if his symptoms worsen Continue gabapentin 600 mg at bedtime

## 2022-06-17 ENCOUNTER — Telehealth: Payer: Self-pay

## 2022-06-17 MED ORDER — TRAMADOL HCL 50 MG PO TABS: 50.0000 mg | ORAL_TABLET | Freq: Three times a day (TID) | ORAL | 0 refills | Status: DC | PRN

## 2022-06-17 NOTE — Telephone Encounter (Signed)
I have never prescribed this for him-I think he may have gotten it from someone else.  Since this is the first time I am prescribing it I can only prescribe it for 5 days.  We can refill this, but he should only take this for severe pain because of the side effects.  Prescription sent to pharmacy.

## 2022-06-17 NOTE — Telephone Encounter (Signed)
Pt is requesting a refill on a Discontinued medication for the neck pain.  traMADol (ULTRAM) 50 MG tablet   Pharmacy: [facility] [place], [place]  Pt has scheduled the CT for 07/03/22 and is wanting something for pain until then.   Please advise

## 2022-06-18 NOTE — Telephone Encounter (Signed)
Spoke with patient today and info given. 

## 2022-06-19 NOTE — Progress Notes (Unsigned)
[facility]                                     Cardiology Office Note:    Date:  06/20/2022   ID:  Paul Mays, DOB 03-20-1928, MRN [id number]  PCP:  Binnie Rail, MD  [facility] Cardiologist:  Sherren Mocha, MD  [place] Electrophysiologist:  None   Referring MD: Binnie Rail, MD   Follow up  History of Present Illness:    Paul Mays is a 88 y.o. male with a hx of CAD remote PCI of the RCA and left circumflex in 1999, left ventricular hypertrophy with septal hypertrophy and LV outflow tract obstruction, possible amyloid but decided to not pursue workup given advanced, HTN, HLD, sleep apnea, peripheral neuropathy, pulmonary hypertension and severe LFLG AS with moderate AI s/p TAVR (11/12/21) who presents to clinic for follow up.   He recently developed progressive DOE and fatigue over the last 6 months. A recent echocardiogram was suggestive of severe aortic stenosis. He was ultimately set up for further workup for TAVR with [facility] and CT imaging. Cath showed severe single-vessel CAD with chronic occlusion of the RCA, collateralized from the left coronary artery as well as severe pulmonary hypertension with pulmonary artery pressure of 78/29, mean 48 mmHg and transpulmonary gradient 23 mmHg, PVR 4.6 Wood units. Medical Rx was recommended for CAD.    He was evaluated by the multidisciplinary valve team and underwent successful TAVR with a 23 mm Edwards Sapien 3 Ultra Resilia via the TF approach on 11/12/21. Post operative echo showed EF 65-70%, moderate LVH, normally functioning TAVR with a mean gradient of 23 mmHg and no PVL as well as mild-mod MR/TR and small circumferential pericardial effusion, RSVP 59.6 mmhg. He was treated with one dose of IV lasix given elevated BNP and CHF on CXR. He was discharged on aspirin alone. Given elevated BPs amlodipine was added.   At follow up BP was elevated and Norvasc increased to '10mg'$   daily. He later called in with worsening LE edema. Norvasc was decreased back to '5mg'$  daily and Hydralazine '25mg'$  BID added. He called back again with no improvement and Lasix increased to '40mg'$  daily and Kdur 38mq daily added. LE edema much improved, right leg has chronic venous stasis with wounds. 1 month echo showed EF 65%, normally functioning TAVR with a mean gradient of 20.3 mm hg and no PVL. There is mod MR/ mod-severe TR . (Gradients are elevated but actually improved from POD1 echo).   Today the patient presents to clinic for follow up. Here alone. No CP. Has DOE with walking. Chronic mild LE edema, but no orthopnea or PND. Weight stable. Chronic dizziness  but no syncope. No blood in stool or urine. No palpitations.    Past Medical History:  Diagnosis Date   Anxiety    Aortic stenosis    Arthralgia    Arthritis    CAD (coronary artery disease)    Carotid stenosis    Cervicalgia    CLUSTER HEADACHE 02/10/2007   Qualifier: Diagnosis of  By: HLinna DarnerMD, WGwyndolyn Saxon    Colitis    Edema    Gout    Hearing loss    Herpes zoster without mention of complication    Hyperlipidemia    Hypertension    Hyperuricemia    Impacted cerumen  Migraine    Migraine    Murmur    Olecranon bursitis    OSA (obstructive sleep apnea)    Other and unspecified hyperlipidemia    Other malaise and fatigue    Peripheral neuropathy    Polyneuropathy    Prostate cancer ([place])    Pulmonary hypertension (HCC)    S/P TAVR (transcatheter aortic valve replacement) 11/12/2021   55m S3UR via TF approach with Dr. CBurt Knackand Dr. BCyndia Bent  Unspecified hereditary and idiopathic peripheral neuropathy    Venous insufficiency     Past Surgical History:  Procedure Laterality Date   ANGIOPLASTY     w/ 2 stents [place]    [place] 11[place]20[place]   Procedure: INTRAOPERATIVE  TRANSTHORACIC ECHOCARDIOGRAM;  Surgeon: CSherren Mocha MD;  [place]  Service: Open Heart Surgery;  Laterality: N/A;   LIPOMA EXCISION     RIGHT/LEFT HEART CATH AND CORONARY ANGIOGRAPHY N/A 10/02/2021   Procedure: RIGHT/LEFT HEART CATH AND CORONARY ANGIOGRAPHY;  Surgeon: CSherren Mocha MD;  [place];  Service: Cardiovascular;  Laterality: N/A;   TONSILLECTOMY     TRANSCATHETER AORTIC VALVE REPLACEMENT, TRANSFEMORAL N/A 11/12/2021   Procedure: TRANSCATHETER AORTIC VALVE REPLACEMENT, TRANSFEMORAL;  Surgeon: CSherren Mocha MD;  [place]  Service: Open Heart Surgery;  Laterality: N/A;   ULTRASOUND GUIDANCE FOR VASCULAR ACCESS Bilateral 11/12/2021   Procedure: ULTRASOUND GUIDANCE FOR VASCULAR ACCESS;  Surgeon: CSherren Mocha MD;  [place]  Service: Open Heart Surgery;  Laterality: Bilateral;    Current Medications: Current Meds  Medication Sig   allopurinol (ZYLOPRIM) 300 MG tablet TAKE 1 TABLET(300 MG) BY MOUTH DAILY AS NEEDED FOR GOUT FLARE   amoxicillin (AMOXIL) 500 MG tablet Take 4 tablets (2,000 mg total) by mouth as directed. 1 HOUR PRIOR TO DENTAL APPOINTMENTS   aspirin EC 81 MG tablet Take 1 tablet (81 mg total) by mouth daily.   Cholecalciferol (VITAMIN D3) 50 MCG (2000 UT) capsule Take 2,000 Units by mouth daily.   furosemide (LASIX) 40 MG tablet Take 1 tablet (40 mg total) by mouth daily.   gabapentin (NEURONTIN) 600 MG tablet Take 1 tablet (600 mg total) by mouth at bedtime.   hydrALAZINE (APRESOLINE) 25 MG tablet TAKE 1 TABLET(25 MG) BY MOUTH IN THE MORNING AND AT BEDTIME   metoprolol tartrate (LOPRESSOR) 25 MG tablet Take 1 tablet (25 mg total) by mouth 2 (two) times daily.   potassium chloride (KLOR-CON M) 10 MEQ tablet Take 10 mEq by mouth as needed. Only take if you use extra Lasix   pravastatin (PRAVACHOL) 40 MG tablet Take 40 mg by mouth daily.   traMADol (ULTRAM) 50 MG tablet Take 1 tablet (50 mg total) by mouth every 8 (eight)  hours as needed.   vitamin B-12 (CYANOCOBALAMIN) 500 MCG tablet Take 500 mcg by mouth daily.     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 Patient finished  two years of college.    Pt has children.   Caffeine -two cups daily.   Left handed.   Retired Licensed conveyancer                    Social Determinants of Radio broadcast assistant Strain: Low Risk  (01/29/2022)   Overall Financial Resource Strain (CARDIA)    Difficulty of Paying Living Expenses: Not hard at all  Food Insecurity: No Food Insecurity (01/29/2022)   Hunger Vital Sign    Worried About Running Out of Food in the Last Year: Never true    [place] in the Last Year: Never true  Transportation Needs: No Transportation Needs (01/29/2022)   PRAPARE - Hydrologist (Medical): No    Lack of Transportation (Non-Medical): No  Physical Activity: Sufficiently Active (01/29/2022)   Exercise Vital Sign    Days of Exercise per Week: 5 days    Minutes of Exercise per Session: 30 min  Stress: No Stress Concern Present (01/29/2022)   [place]    Feeling of Stress : Not at all  Social  Connections: [place] (01/29/2022)   Social Connection and Isolation Panel [NHANES]    Frequency of Communication with Friends and Family: More than three times a week    Frequency of Social Gatherings with Friends and Family: More than three times a week    Attends Religious Services: More than 4 times per year    Active Member of [employer] or Organizations: Yes    Attends Music therapist: More than 4 times per year    Marital Status: Married     Family History: The patient's family history includes Diabetes in his brother, father, and sister; Healthy in his daughter and son; High blood pressure in his father; Other in his mother; Stroke in his father.  ROS:   Please see the history of present illness.    All other systems reviewed and are negative.  EKGs/Labs/Other Studies Reviewed:    The following studies were reviewed today:  TAVR OPERATIVE NOTE     Date of Procedure:                11/12/2021   Preoperative Diagnosis:      Severe Aortic Stenosis    Postoperative Diagnosis:    Same    Procedure:        Transcatheter Aortic Valve Replacement - Percutaneous Left Transfemoral Approach             Edwards Sapien 3 Ultra ResiliaTHV (size 23 mm, model # 9755RSL, serial # [id number])                Anesthesiologist:                  Renold Don, MD   Echocardiographer:              Bertrum [name], MD   Pre-operative Echo Findings: Severe aortic stenosis  Normal left ventricular systolic function   Post-operative Echo Findings: No paravalvular leak Normal left ventricular systolic function   ___________   Echo 11/13/21: IMPRESSIONS  1. Left ventricular ejection fraction, by estimation, is 65 to 70%. The  left ventricle has hyperdynamic function. The left ventricle has no  regional wall motion abnormalities. There is moderate concentric left  ventricular hypertrophy. Left ventricular  diastolic parameters are consistent with Grade II diastolic dysfunction   (pseudonormalization). Elevated left atrial pressure.   2. Right ventricular systolic function is normal. The right ventricular  size is normal. There is moderately elevated pulmonary artery systolic  pressure. The estimated right ventricular systolic pressure is 43.3 mmHg.   3. Left atrial size was severely dilated.   4. Pericardial effusion is unchanged from the preoperative study. The  pericardial effusion is circumferential. There is no evidence of cardiac  tamponade.   5. The mitral valve is degenerative. Mild to moderate mitral valve  regurgitation. No evidence of mitral stenosis. Moderate mitral annular  calcification.   6. Tricuspid valve regurgitation is mild to moderate.   7. Aortic prosthesis gradients are exaggerated by hyperdynamic left  ventricular function. The aortic valve has been repaired/replaced. Aortic  valve regurgitation is not visualized. There is a 23 mm Edwards Sapien  prosthetic (TAVR) valve present in the  aortic position. Procedure Date: 11/12/21. Echo findings are consistent with  normal structure and function of the aortic valve prosthesis. Aortic valve  mean gradient measures 23.0 mmHg. Aortic valve Vmax measures 3.22 m/s.  Aortic valve acceleration time  measures 74 msec.   8. The inferior vena cava is dilated in size with >50% respiratory  variability, suggesting right atrial pressure of 8 mmHg.   ____________________  Echo 12/13/21 IMPRESSIONS  1. Speckled pattern in the LV raising suspision for cardiac amyloid. Recommend futher diagnostic testing with Cardiac MR or PYP scan. Left ventricular ejection fraction, by estimation, is 55 to 60%. The left ventricle has normal function. The left  ventricle has no regional wall motion abnormalities. There is severe asymmetric left ventricular hypertrophy of the septal segment. Left ventricular diastolic function could not be evaluated. Elevated left atrial pressure.  2. Right ventricular systolic function is  normal. The right ventricular size is normal. There is severely elevated pulmonary artery systolic pressure.  3. Left atrial size was severely dilated.  4. Right atrial size was severely dilated.  5. The mitral valve is normal in structure. Moderate mitral valve regurgitation. No evidence of mitral stenosis. Moderate mitral annular calcification.  6. Tricuspid valve regurgitation is moderate to severe.  7. The aortic valve has been repaired/replaced. Aortic valve regurgitation is not visualized. No aortic stenosis is present. Aortic prosthesis gradient slight elevated but jet contour remain early peaking and triangular consistent with a normal aortic  prosthetic valve. There is a 23 mm Sapien prosthetic (TAVR) valve present in the aortic position. Procedure Date: 11/12/2021. Aortic valve mean gradient measures 20.3 mmHg. Aortic valve Vmax measures 3.28 m/s.  8. The inferior vena cava is normal in size with greater than 50% respiratory variability, suggesting right atrial pressure of 3 mmHg.   EKG:  EKG is NOT ordered today.    Recent Labs: 11/08/2021: ALT 18; B Natriuretic Peptide >4,500.0 11/13/2021: Hemoglobin 11.4; Platelets 80 12/13/2021: BUN 25; Creatinine, Ser 1.01; Potassium 5.0; Sodium 139  Recent Lipid Panel    Component Value Date/Time   CHOL 149 12/09/2017 1457   TRIG 57.0 12/09/2017 1457   HDL 72.30 12/09/2017 1457  CHOLHDL 2 12/09/2017 1457   VLDL 11.4 12/09/2017 1457   LDLCALC 65 12/09/2017 1457     Risk Assessment/Calculations:       Physical Exam:    VS:  BP 132/64 (BP Location: Left Arm, Patient Position: Sitting, Cuff Size: Normal)   Pulse 80   Ht '5\' 6"'$  (1.676 m)   Wt 146 lb (66.2 kg)   SpO2 90%   BMI 23.57 kg/m     Wt Readings from Last 3 Encounters:  06/20/22 146 lb (66.2 kg)  06/10/22 149 lb 12.8 oz (67.9 kg)  04/01/22 150 lb (68 kg)     GEN:  Well nourished, well developed in no acute distress HEENT: Normal NECK: No JVD LYMPHATICS: No  lymphadenopathy CARDIAC: RRR, very soft flow murmur. No rubs, gallops RESPIRATORY:  Clear to auscultation without rales, wheezing or rhonchi  ABDOMEN: Soft, non-tender, non-distended MUSCULOSKELETAL:  chronic stable 1+ :BLE edema; No deformity  SKIN: Warm and dry.   NEUROLOGIC:  Alert and oriented x 3 PSYCHIATRIC:  Normal affect   ASSESSMENT:    1. S/P TAVR (transcatheter aortic valve replacement)   2. Essential hypertension   3. Chronic diastolic CHF (congestive heart failure) ([place])   4. Coronary artery disease involving native coronary artery of native heart without angina pectoris   5. LVH (left ventricular hypertrophy)      PLAN:    In order of problems listed above:  Severe LFLG AS/ modAI s/p TAVR: he is clinically stable with NYHA class II symptoms.  Continue on aspirin alone. He has amoxicillin for SBE prophylaxis. We will see him back for 1 year TAVR follow up with echo.    Hypertension: currently on Lasix '40mg'$  daily, Lopressor '25mg'$  BID, Norvasc '5mg'$  daily, and hydralazine '25mg'$  BID. BP well controlled today. Of note, we have avoided ACE/ARBs as he has had issues with hyperkalemia with this class of medications and he had worsening LE edema with Norvasc '10mg'$ .    Chronic diastolic CHF: has chronic mild LE edema but weight is stable and he doesn't appear overloaded. Continue current medications.   CAD: s/p remote PCI of the RCA and left circumflex in 1999. Recent cath with stable disease. No anginal symptoms. Will continue with medical therapy for CAD.    Left ventricular hypertrophy with septal hypertrophy and LV outflow tract obstruction: felt to possibly have amyloid however but decided to not pursue workup given advanced age. Most recent echo c/w amyloid.      Medication Adjustments/Labs and Tests Ordered: Current medicines are reviewed at length with the patient today.  Concerns regarding medicines are outlined above.  Orders Placed This Encounter  Procedures    ECHOCARDIOGRAM COMPLETE   No orders of the defined types were placed in this encounter.   Patient Instructions  Medication Instructions:  Your physician recommends that you continue on your current medications as directed. Please refer to the Current Medication list given to you today.   *If you need a refill on your cardiac medications before your next appointment, please call your pharmacy*   Lab Work: None ordered   If you have labs (blood work) drawn today and your tests are completely normal, you will receive your results only by: [place] (if you have MyChart) OR A paper copy in the mail If you have any lab test that is abnormal or we need to change your treatment, we will call you to review the results.   Testing/Procedures: None ordered    Follow-Up: Follow up as  scheduled   Other Instructions   Important Information About Sugar         Signed, Angelena Form, PA-C  06/20/2022 11:50 AM    [place]

## 2022-06-20 ENCOUNTER — Ambulatory Visit: Payer: Medicare Other | Attending: Physician Assistant | Admitting: Physician Assistant

## 2022-06-20 VITALS — BP 132/64 | HR 80 | Ht 66.0 in | Wt 146.0 lb

## 2022-06-20 DIAGNOSIS — Z952 Presence of prosthetic heart valve: Secondary | ICD-10-CM | POA: Insufficient documentation

## 2022-06-20 DIAGNOSIS — I10 Essential (primary) hypertension: Secondary | ICD-10-CM | POA: Diagnosis not present

## 2022-06-20 DIAGNOSIS — I2510 Atherosclerotic heart disease of native coronary artery without angina pectoris: Secondary | ICD-10-CM

## 2022-06-20 DIAGNOSIS — Z9861 Coronary angioplasty status: Secondary | ICD-10-CM | POA: Diagnosis not present

## 2022-06-20 DIAGNOSIS — I517 Cardiomegaly: Secondary | ICD-10-CM

## 2022-06-20 DIAGNOSIS — I5032 Chronic diastolic (congestive) heart failure: Secondary | ICD-10-CM | POA: Diagnosis not present

## 2022-06-20 NOTE — Patient Instructions (Signed)

## 2022-06-24 ENCOUNTER — Ambulatory Visit
Admission: RE | Admit: 2022-06-24 | Discharge: 2022-06-24 | Disposition: A | Discharge status: Home / Self Care | Payer: Medicare Other | Source: Ambulatory Visit | Attending: Internal Medicine | Admitting: Internal Medicine

## 2022-06-24 DIAGNOSIS — M542 Cervicalgia: Secondary | ICD-10-CM

## 2022-06-28 ENCOUNTER — Other Ambulatory Visit: Payer: Self-pay | Admitting: Internal Medicine

## 2022-06-28 DIAGNOSIS — M542 Cervicalgia: Secondary | ICD-10-CM

## 2022-07-03 ENCOUNTER — Ambulatory Visit (INDEPENDENT_AMBULATORY_CARE_PROVIDER_SITE_OTHER): Payer: Medicare Other | Admitting: Surgery

## 2022-07-03 ENCOUNTER — Other Ambulatory Visit: Payer: Medicare Other

## 2022-07-03 ENCOUNTER — Encounter: Payer: Self-pay | Admitting: Surgery

## 2022-07-03 ENCOUNTER — Ambulatory Visit: Payer: Self-pay

## 2022-07-03 VITALS — BP 113/65 | HR 94 | Ht 66.0 in | Wt 146.0 lb

## 2022-07-03 DIAGNOSIS — Z9861 Coronary angioplasty status: Secondary | ICD-10-CM | POA: Diagnosis not present

## 2022-07-03 DIAGNOSIS — M542 Cervicalgia: Secondary | ICD-10-CM | POA: Diagnosis not present

## 2022-07-03 DIAGNOSIS — I2510 Atherosclerotic heart disease of native coronary artery without angina pectoris: Secondary | ICD-10-CM

## 2022-07-03 DIAGNOSIS — M65351 Trigger finger, right little finger: Secondary | ICD-10-CM

## 2022-07-03 DIAGNOSIS — M47812 Spondylosis without myelopathy or radiculopathy, cervical region: Secondary | ICD-10-CM

## 2022-07-03 DIAGNOSIS — M65331 Trigger finger, right middle finger: Secondary | ICD-10-CM | POA: Diagnosis not present

## 2022-07-07 ENCOUNTER — Telehealth: Payer: Self-pay | Admitting: Internal Medicine

## 2022-07-07 MED ORDER — PRAVASTATIN SODIUM 40 MG PO TABS: 40.0000 mg | ORAL_TABLET | Freq: Every day | ORAL | 3 refills | Status: DC

## 2022-07-07 NOTE — Telephone Encounter (Signed)
Patient is requesting a refill on his prevastatin - Please send to [facility] on [address] - He only has 3 left.

## 2022-07-09 DIAGNOSIS — Z23 Encounter for immunization: Secondary | ICD-10-CM | POA: Diagnosis not present

## 2022-07-09 NOTE — Progress Notes (Signed)
Office Visit Note   Patient: Paul Mays           Date of Birth: 1928-09-22           MRN: [id number] Visit Date: 07/03/2022              Requested by: Binnie Rail, MD [place],  [place] [address] PCP: Binnie Rail, MD   Assessment & Plan: Visit Diagnoses:  1. Cervicalgia     Plan: Since patient states that he is currently feeling better we will continue to watch this issue.  Follow-up in the office with Dr. Laurance Flatten in 4 weeks for recheck and he can also review patient's CT scan with him.  Regards to his right hand small and long trigger finger I did reach out to Dr. Roseanne Kaufman hand surgeon with [facility] and he will help me get patient appointment to see him.  Patient states that he is interested in having trigger finger releases.  He failed conservative treatment with previous injections with Dr. Tempie Donning.  Follow-Up Instructions: Return in about 4 weeks (around 07/31/2022) for Dr Laurance Flatten evaluate cervical spine.   Orders:  Orders Placed This Encounter  Procedures   XR Cervical Spine 2 or 3 views   No orders of the defined types were placed in this encounter.     Procedures: No procedures performed   Clinical Data: No additional findings.   Subjective: Chief Complaint  Patient presents with   Neck - Pain    HPI 88 year old male comes in with complaints of neck pain.  Patient states that he has been seen by his primary care provider Dr. Quay Burow for ongoing neck pain.  She ordered a CT cervical spine that was done on June 24, 2022 and that study showed:  EXAM: CT CERVICAL SPINE WITHOUT CONTRAST   TECHNIQUE: Multidetector CT imaging of the cervical spine was performed without intravenous contrast. Multiplanar CT image reconstructions were also generated.   RADIATION DOSE REDUCTION: This exam was performed according to the departmental dose-optimization program which includes automated exposure control, adjustment of the mA and/or kV  according to patient size and/or use of iterative reconstruction technique.   COMPARISON:  03/12/2009   FINDINGS: Alignment: 3 mm anterolisthesis C4 on C5, unchanged compared to 2010. 3 mm anterolisthesis of T1 on T2, which is new compared to 2010 and appears facet mediated.   Skull base and vertebrae: No acute fracture or suspicious osseous lesion. Osseous fusion of C2 and C3, likely degenerative.   Soft tissues and spinal canal: No prevertebral fluid or swelling. No visible canal hematoma.   Disc levels:   C2-C3: Osseous fusion. No significant disc bulge. Left-greater-than-right facet arthropathy. No spinal canal stenosis or neuroforaminal narrowing.   C3-C4: Minimal disc bulge. Right-greater-than-left facet and uncovertebral hypertrophy. No spinal canal stenosis. Severe right and moderate left neural foraminal narrowing, similar to prior.   C4-C5: Anterolisthesis with disc unroofing. Left-greater-than-right facet and uncovertebral hypertrophy. No spinal canal stenosis. Severe left neural foraminal narrowing, similar to prior.   C5-C6: Minimal disc bulge. Facet and uncovertebral hypertrophy. No spinal canal stenosis or neural foraminal narrowing.   C6-C7: Prior right hemilaminectomy at C6. No significant disc bulge. Facet and uncovertebral hypertrophy. No spinal canal stenosis or neural foraminal narrowing.   C7-T1: No significant disc bulge. Facet arthropathy. No spinal canal stenosis or neuroforaminal narrowing.   Upper chest: Negative.   Other: Carotid atherosclerosis.   IMPRESSION: 1. C3-C4 severe right and moderate left neural  foraminal narrowing. 2. C4-C5 severe left neural foraminal narrowing. 3. No significant spinal canal stenosis.     Electronically Signed   By: Merilyn Baba M.D.  Currently states that his neck is actually doing better.  Complains of some stiffness.  No upper extremity radicular symptoms.  Biggest complaint at this time is with right  hand small and long trigger finger.  He was seen by Dr. Tempie Donning for this April 01, 2022 and had injections.  States that he did not have any improvement.  He continues have ongoing locking/treatment of the fingers.   Review of Systems No current complaints of cardiopulmonary GI/GU issues  Objective: Vital Signs: BP 113/65   Pulse 94   Ht '5\' 6"'$  (1.676 m)   Wt 146 lb (66.2 kg)   BMI 23.57 kg/m   Physical Exam HENT:     Head: Normocephalic and atraumatic.     Nose: Nose normal.  Eyes:     Extraocular Movements: Extraocular movements intact.  Pulmonary:     Effort: No respiratory distress.  Musculoskeletal:     Comments: Server spine he does have some limitation range of motion due to stiffness.  Mild bilateral trapezius tenderness.  No brachial plexus tenderness.  Right hand small and long trigger finger with catching at the A1 pulley with tenderness.  Neurological:     Mental Status: He is alert and oriented to person, place, and time.  Psychiatric:        Mood and Affect: Mood normal.     Ortho Exam  Specialty Comments:  No specialty comments available.  Imaging: No results found.   PMFS History: Patient Active Problem List   Diagnosis Date Noted   Neck pain, acute 06/10/2022   Trigger finger, right middle finger 04/01/2022   Trigger little finger of right hand 03/27/2022   S/P TAVR (transcatheter aortic valve replacement) 11/12/2021   Dyspnea on exertion 08/05/2021   Acute on chronic diastolic heart failure ([place]) 08/05/2021   Lower extremity ulceration ([place]) 03/08/2021   Weight loss 02/19/2021   Lightheadedness 03/54/6568   Periumbilical pain 12/75/1700   Fall 08/21/2020   Head trauma, initial encounter 08/21/2020   Bilateral leg edema 01/26/2020   GERD (gastroesophageal reflux disease) 01/26/2020   Chronic pain of both shoulders 12/10/2018   Easy bruising 03/20/2018   Decreased hearing of right ear 09/04/2017   Rotator cuff tear arthropathy of both  shoulders 10/18/2015   Thrombocytopenia ([place]) 09/24/2015   Diverticulosis of colon without hemorrhage 07/02/2014   Hypertrophic cardiomyopathy ([place]) 03/24/2011   OBSTRUCTIVE SLEEP APNEA 11/08/2010   HYPERTENSION, PULMONARY 09/16/2010   HEARING LOSS, BILATERAL 06/18/2009   Coronary atherosclerosis 03/07/2009   Severe aortic stenosis 03/07/2009   CAROTID STENOSIS 03/07/2009   Hyperlipidemia 11/15/2008   Peripheral neuropathy, idiopathic 11/15/2008   Essential hypertension 11/15/2008   COLITIS, HX OF 11/15/2008   VENOUS INSUFFICIENCY 04/20/2007   Gout 02/10/2007   Past Medical History:  Diagnosis Date   Anxiety    Aortic stenosis    Arthralgia    Arthritis    CAD (coronary artery disease)    Carotid stenosis    Cervicalgia    CLUSTER HEADACHE 02/10/2007   Qualifier: Diagnosis of  By: Linna Darner MD, Gwyndolyn Saxon     Colitis    Edema    Gout    Hearing loss    Herpes zoster without mention of complication    Hyperlipidemia    Hypertension    Hyperuricemia    Impacted cerumen  Migraine    Migraine    Murmur    Olecranon bursitis    OSA (obstructive sleep apnea)    Other and unspecified hyperlipidemia    Other malaise and fatigue    Peripheral neuropathy    Polyneuropathy    Prostate cancer ([place])    Pulmonary hypertension (HCC)    S/P TAVR (transcatheter aortic valve replacement) 11/12/2021   68m S3UR via TF approach with Dr. CBurt Knackand Dr. BCyndia Bent  Unspecified hereditary and idiopathic peripheral neuropathy    Venous insufficiency     Family History  Problem Relation Age of Onset   Stroke Father        Died, 83   High blood pressure Father    Diabetes Father    Other Mother        Died, 836  Diabetes Brother        Died, 822  Diabetes Sister        Living, 877  Healthy Daughter    Healthy Son     Past Surgical History:  Procedure Laterality Date   ANGIOPLASTY     w/ 2 stents in [place]ilateral    [place] 11/12[place]20[place]   Procedure: INTRAOPERATIVE TRANSTHORACIC ECHOCARDIOGRAM;  Surgeon: CSherren Mocha MD;  [facility]  Service: Open Heart Surgery;  Laterality: N/A;   LIPOMA EXCISION     RIGHT/LEFT HEART CATH AND CORONARY ANGIOGRAPHY N/A 10/02/2021   Procedure: RIGHT/LEFT HEART CATH AND CORONARY ANGIOGRAPHY;  Surgeon: CSherren Mocha MD;  [facility];  Service: Cardiovascular;  Laterality: N/A;   TONSILLECTOMY     TRANSCATHETER AORTIC VALVE REPLACEMENT, TRANSFEMORAL N/A 11/12/2021   Procedure: TRANSCATHETER AORTIC VALVE REPLACEMENT, TRANSFEMORAL;  Surgeon: CSherren Mocha MD;  [facility]  Service: Open Heart Surgery;  Laterality: N/A;   ULTRASOUND GUIDANCE FOR VASCULAR ACCESS Bilateral 11/12/2021   Procedure: ULTRASOUND GUIDANCE FOR VASCULAR ACCESS;  Surgeon: CSherren Mocha MD;  [facility]  Service: Open Heart Surgery;  Laterality: Bilateral;   Social History   Occupational History   Occupation: retired    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

## 2022-07-13 IMAGING — DX DG CHEST 2V
2 series · 2 of 2 positions shown · non-contrast
Comparison: 02/19/2021

CLINICAL DATA: Progressive shortness of breath for several weeks.

EXAM:
CHEST - 2 VIEW

[chest pa]
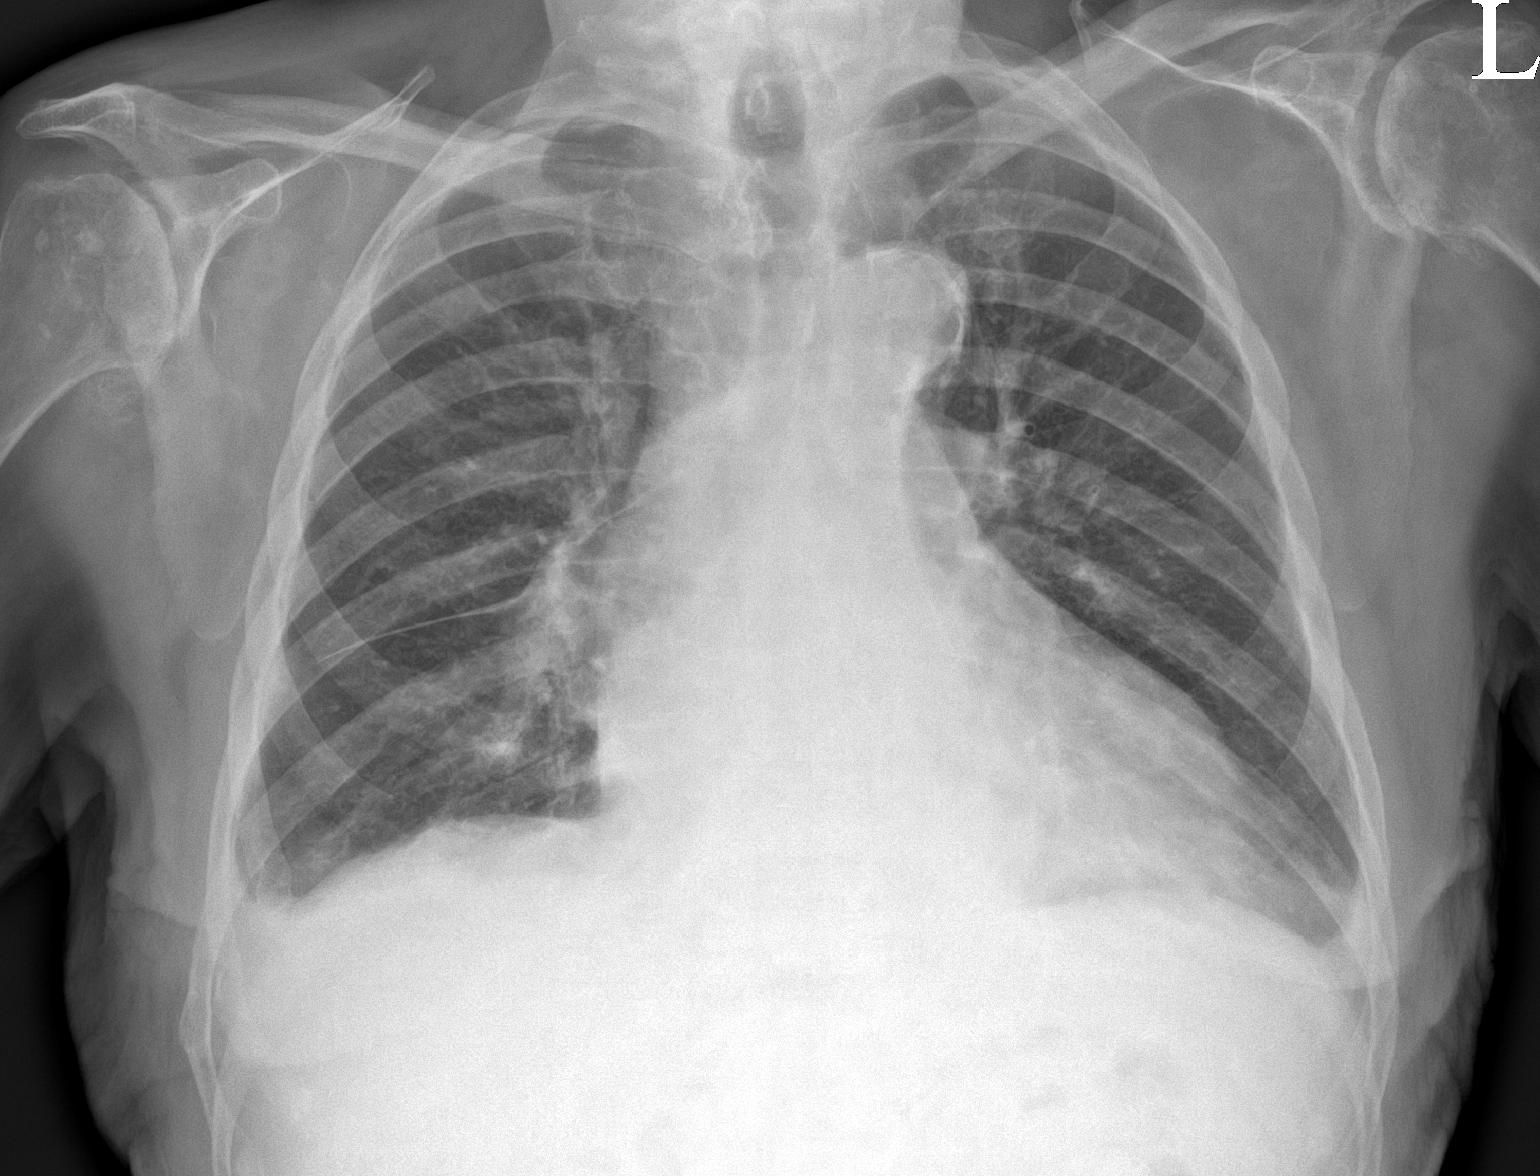

[chest lat]
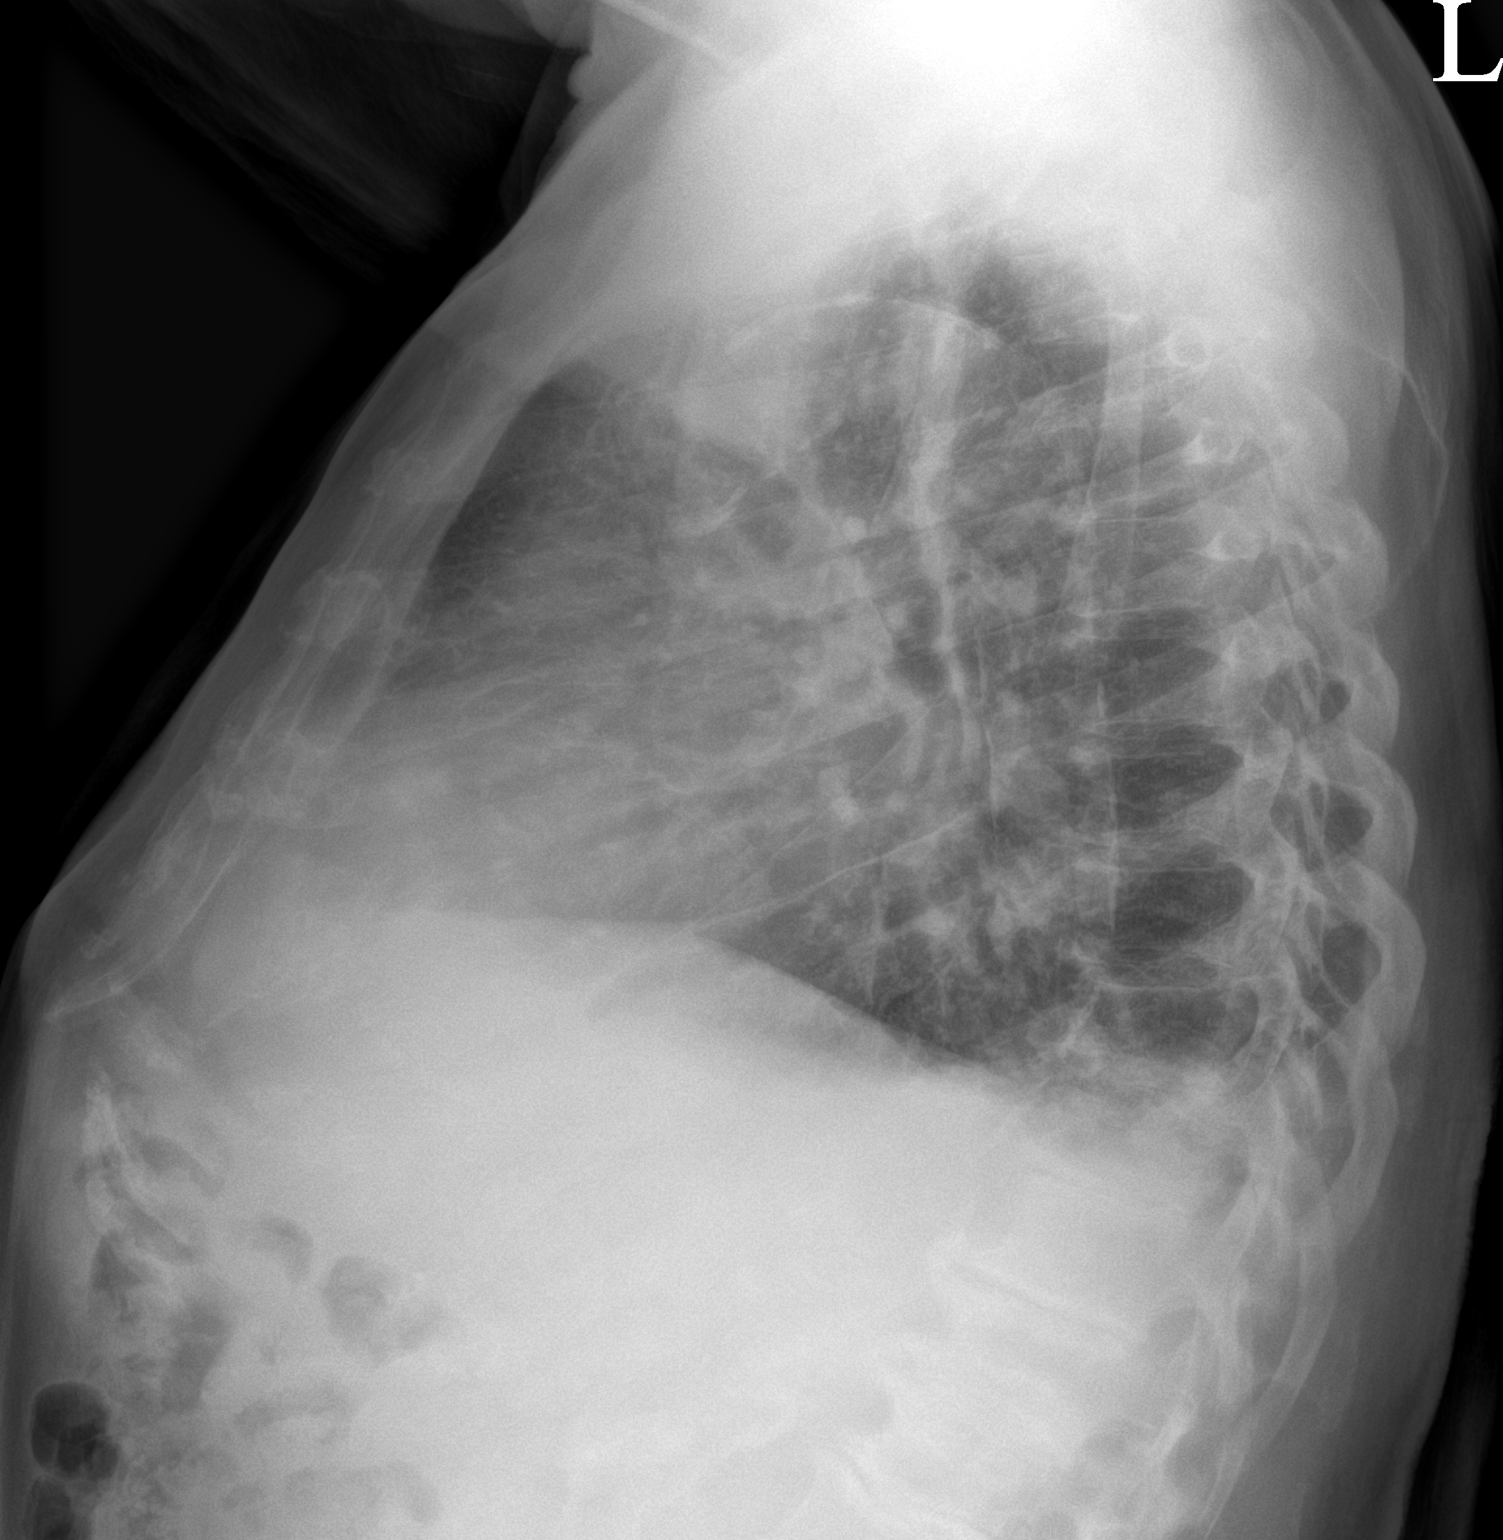

[2 of 2 positions shown; findings below may reference images not displayed]

FINDINGS: Stable moderate cardiomegaly. Aortic atherosclerotic calcification
noted. No evidence of acute pulmonary infiltrate or edema. New tiny
bilateral pleural effusions are noted.
IMPRESSION: New tiny bilateral pleural effusions.

Stable cardiomegaly.  No evidence of pulmonary infiltrate or edema.

## 2022-07-16 ENCOUNTER — Ambulatory Visit: Payer: Medicare Other | Admitting: Internal Medicine

## 2022-07-21 ENCOUNTER — Encounter: Payer: Self-pay | Admitting: Internal Medicine

## 2022-07-21 NOTE — Progress Notes (Unsigned)
    Subjective:    Patient ID: Paul Mays, male    DOB: 1928-01-12, 88 y.o.   MRN: [id number]      HPI Piotr is here for No chief complaint on file.        Medications and allergies reviewed with patient and updated if appropriate.  Current Outpatient Medications on File Prior to Visit  Medication Sig Dispense Refill   allopurinol (ZYLOPRIM) 300 MG tablet TAKE 1 TABLET(300 MG) BY MOUTH DAILY AS NEEDED FOR GOUT FLARE 90 tablet 0   amLODipine (NORVASC) 5 MG tablet Take 1 tablet (5 mg total) by mouth daily. 180 tablet 3   amoxicillin (AMOXIL) 500 MG tablet Take 4 tablets (2,000 mg total) by mouth as directed. 1 HOUR PRIOR TO DENTAL APPOINTMENTS 12 tablet 6   aspirin EC 81 MG tablet Take 1 tablet (81 mg total) by mouth daily.     Cholecalciferol (VITAMIN D3) 50 MCG (2000 UT) capsule Take 2,000 Units by mouth daily.     furosemide (LASIX) 40 MG tablet Take 1 tablet (40 mg total) by mouth daily. 90 tablet 2   gabapentin (NEURONTIN) 600 MG tablet Take 1 tablet (600 mg total) by mouth at bedtime. 90 tablet 1   hydrALAZINE (APRESOLINE) 25 MG tablet TAKE 1 TABLET(25 MG) BY MOUTH IN THE MORNING AND AT BEDTIME 60 tablet 10   metoprolol tartrate (LOPRESSOR) 25 MG tablet Take 1 tablet (25 mg total) by mouth 2 (two) times daily. 180 tablet 3   potassium chloride (KLOR-CON M) 10 MEQ tablet Take 10 mEq by mouth as needed. Only take if you use extra Lasix     pravastatin (PRAVACHOL) 40 MG tablet Take 1 tablet (40 mg total) by mouth daily. 90 tablet 3   traMADol (ULTRAM) 50 MG tablet Take 1 tablet (50 mg total) by mouth every 8 (eight) hours as needed. 15 tablet 0   vitamin B-12 (CYANOCOBALAMIN) 500 MCG tablet Take 500 mcg by mouth daily.     No current facility-administered medications on file prior to visit.    Review of Systems     Objective:  There were no vitals filed for this visit. BP Readings from Last 3 Encounters:  07/03/22 113/65  06/20/22 132/64  06/10/22 124/64   Wt  Readings from Last 3 Encounters:  07/03/22 146 lb (66.2 kg)  06/20/22 146 lb (66.2 kg)  06/10/22 149 lb 12.8 oz (67.9 kg)   There is no height or weight on file to calculate BMI.    Physical Exam         Assessment & Plan:    See Problem List for Assessment and Plan of chronic medical problems.

## 2022-07-22 ENCOUNTER — Ambulatory Visit (INDEPENDENT_AMBULATORY_CARE_PROVIDER_SITE_OTHER): Payer: Medicare Other | Admitting: Internal Medicine

## 2022-07-22 VITALS — BP 130/74 | HR 71 | Temp 98.2°F | Ht 66.0 in | Wt 141.0 lb

## 2022-07-22 DIAGNOSIS — I10 Essential (primary) hypertension: Secondary | ICD-10-CM | POA: Diagnosis not present

## 2022-07-22 DIAGNOSIS — D7589 Other specified diseases of blood and blood-forming organs: Secondary | ICD-10-CM | POA: Diagnosis not present

## 2022-07-22 DIAGNOSIS — R42 Dizziness and giddiness: Secondary | ICD-10-CM | POA: Diagnosis not present

## 2022-07-22 DIAGNOSIS — M109 Gout, unspecified: Secondary | ICD-10-CM

## 2022-07-22 DIAGNOSIS — I2510 Atherosclerotic heart disease of native coronary artery without angina pectoris: Secondary | ICD-10-CM

## 2022-07-22 DIAGNOSIS — Z9861 Coronary angioplasty status: Secondary | ICD-10-CM

## 2022-07-22 DIAGNOSIS — M542 Cervicalgia: Secondary | ICD-10-CM | POA: Diagnosis not present

## 2022-07-22 DIAGNOSIS — G8929 Other chronic pain: Secondary | ICD-10-CM | POA: Diagnosis not present

## 2022-07-22 DIAGNOSIS — D649 Anemia, unspecified: Secondary | ICD-10-CM | POA: Diagnosis not present

## 2022-07-22 LAB — CBC WITH DIFFERENTIAL/PLATELET
Basophils Absolute: 0.1 10*3/uL (ref 0.0–0.1)
Basophils Relative: 1.2 % (ref 0.0–3.0)
Eosinophils Absolute: 0.1 10*3/uL (ref 0.0–0.7)
Eosinophils Relative: 2.5 % (ref 0.0–5.0)
HCT: 31.9 % — ABNORMAL LOW (ref 39.0–52.0)
Hemoglobin: 10.3 g/dL — ABNORMAL LOW (ref 13.0–17.0)
Lymphocytes Relative: 22 % (ref 12.0–46.0)
Lymphs Abs: 1 10*3/uL (ref 0.7–4.0)
MCHC: 32.4 g/dL (ref 30.0–36.0)
MCV: 85.1 fl (ref 78.0–100.0)
Monocytes Absolute: 0.8 10*3/uL (ref 0.1–1.0)
Monocytes Relative: 17.7 % — ABNORMAL HIGH (ref 3.0–12.0)
Neutro Abs: 2.6 10*3/uL (ref 1.4–7.7)
Neutrophils Relative %: 56.6 % (ref 43.0–77.0)
Platelets: 108 10*3/uL — ABNORMAL LOW (ref 150.0–400.0)
RBC: 3.75 Mil/uL — ABNORMAL LOW (ref 4.22–5.81)
RDW: 17.2 % — ABNORMAL HIGH (ref 11.5–15.5)
WBC: 4.6 10*3/uL (ref 4.0–10.5)

## 2022-07-22 LAB — VITAMIN B12: Vitamin B-12: 1469 pg/mL — ABNORMAL HIGH (ref 211–911)

## 2022-07-22 LAB — FERRITIN: Ferritin: 54.6 ng/mL (ref 22.0–322.0)

## 2022-07-22 NOTE — Patient Instructions (Addendum)
      Blood work was ordered.   The lab is on the first floor.    Medications changes include :   none      No follow-ups on file.

## 2022-07-22 NOTE — Assessment & Plan Note (Signed)
Chronic Blood pressure well controlled CMP Continue amlodipine 5 mg daily, hydralazine 25 mg twice daily, metoprolol 25 mg twice daily

## 2022-07-22 NOTE — Assessment & Plan Note (Addendum)
Chronic Neck pain that involves the upper back No pain in the arms, numbness/tingling or weakness Has pain a few times a week and can last a couple of days Currently taking gabapentin and states pain is tolerable Wonders about injections CT scan did show areas of moderate-severe foraminal stenosis, but his pain is tolerable with the gabapentin and is not having any pain in the arms or numbness or tingling so we agreed to hold off on evaluation of possible epidural unless pain worsens Continue gabapentin 600 mg at bedtime-occasionally takes 300 mg during the day Continue tramadol 50 mg every 8 hours as needed for severe pain

## 2022-07-22 NOTE — Assessment & Plan Note (Signed)
Chronic Controlled with allopurinol 300 mg daily Check uric acid level

## 2022-07-22 NOTE — Assessment & Plan Note (Signed)
Acute Coming in complaining of intermittent lightheadedness-has it with sitting and it is not worse with standing so unlikely orthostatic hypotension Advise drinking more fluids We will hold off on changing blood pressure medication since lightheadedness is not worse with standing Check blood work today including CBC, CMP, iron panel

## 2022-07-22 NOTE — Assessment & Plan Note (Addendum)
Chronic mild anemia Check CBC, iron panel, B12

## 2022-07-23 LAB — COMPREHENSIVE METABOLIC PANEL WITH GFR
ALT: 13 U/L (ref 0–53)
AST: 19 U/L (ref 0–37)
Albumin: 4.3 g/dL (ref 3.5–5.2)
Alkaline Phosphatase: 43 U/L (ref 39–117)
BUN: 26 mg/dL — ABNORMAL HIGH (ref 6–23)
CO2: 25 meq/L (ref 19–32)
Calcium: 9.4 mg/dL (ref 8.4–10.5)
Chloride: 107 meq/L (ref 96–112)
Creatinine, Ser: 1.18 mg/dL (ref 0.40–1.50)
GFR: 53.5 mL/min — ABNORMAL LOW
Glucose, Bld: 95 mg/dL (ref 70–99)
Potassium: 4.7 meq/L (ref 3.5–5.1)
Sodium: 139 meq/L (ref 135–145)
Total Bilirubin: 2 mg/dL — ABNORMAL HIGH (ref 0.2–1.2)
Total Protein: 7 g/dL (ref 6.0–8.3)

## 2022-07-23 LAB — IBC PANEL
Iron: 53 ug/dL (ref 42–165)
Saturation Ratios: 11.4 % — ABNORMAL LOW (ref 20.0–50.0)
TIBC: 464.8 ug/dL — ABNORMAL HIGH (ref 250.0–450.0)
Transferrin: 332 mg/dL (ref 212.0–360.0)

## 2022-07-23 LAB — URIC ACID: Uric Acid, Serum: 9.6 mg/dL — ABNORMAL HIGH (ref 4.0–7.8)

## 2022-07-27 NOTE — Addendum Note (Signed)
Addended by: Binnie Rail on: 07/27/2022 03:54 PM   Modules accepted: Orders

## 2022-07-29 ENCOUNTER — Telehealth: Payer: Self-pay | Admitting: Hematology

## 2022-07-29 ENCOUNTER — Telehealth: Payer: Self-pay | Admitting: Internal Medicine

## 2022-07-29 NOTE — Telephone Encounter (Signed)
Spoke with patient today. 

## 2022-07-29 NOTE — Telephone Encounter (Signed)
Patient wants to know the blood reults that caused him to have to go to the cancer center. Pt appt at cancer center is 11/13.  Call pt at [phone]

## 2022-07-29 NOTE — Telephone Encounter (Signed)
Scheduled appt per 10/22 referral. Pt is aware of appt date and time. Pt is aware to arrive 15 mins prior to appt time and to bring and updated insurance card. Pt is aware of appt location.

## 2022-07-31 ENCOUNTER — Telehealth: Payer: Self-pay | Admitting: Surgery

## 2022-07-31 ENCOUNTER — Ambulatory Visit (INDEPENDENT_AMBULATORY_CARE_PROVIDER_SITE_OTHER): Payer: Medicare Other | Admitting: Surgery

## 2022-07-31 ENCOUNTER — Encounter: Payer: Self-pay | Admitting: Surgery

## 2022-07-31 VITALS — BP 144/78 | HR 78 | Ht 66.0 in | Wt 141.0 lb

## 2022-07-31 DIAGNOSIS — M47812 Spondylosis without myelopathy or radiculopathy, cervical region: Secondary | ICD-10-CM

## 2022-07-31 MED ORDER — METHYLPREDNISOLONE ACETATE 80 MG/ML IJ SUSP: 80.0000 mg | Freq: Once | INTRAMUSCULAR | Status: DC

## 2022-07-31 NOTE — Telephone Encounter (Signed)
Pt called requesting a call back from [facility]. Pt states PA Ricard Dillon told him to call if he is still having nerve pains. Please call pt at [phone]

## 2022-07-31 NOTE — Telephone Encounter (Signed)
Can you advise on message below?  Thank you

## 2022-07-31 NOTE — Telephone Encounter (Signed)
Can you have Jeneen Rinks advise? He had patient following up in 4 weeks to see Dr. Laurance Flatten, but also was referring to Dr. Amedeo Plenty.

## 2022-07-31 NOTE — Progress Notes (Signed)
Office Visit Note   Patient: Paul Mays           Date of Birth: 10-15-1927           MRN: [id number] Visit Date: 07/31/2022              Requested by: Binnie Rail, MD [place],  [place] [address] PCP: Binnie Rail, MD   Assessment & Plan: Visit Diagnoses:  1. Spondylosis without myelopathy or radiculopathy, cervical region     Plan: As my previous note was documented I would like patient to follow-up with Dr. Laurance Flatten here in the office to review the cervical CT and discuss treatment options.  Today patient was given Depo-Medrol 80 mg IM injection to see if this will at least give him some improvement until he is evaluated.  Follow-Up Instructions: Return in about 1 week (around 08/07/2022) for WITH DR [place].   Orders:  No orders of the defined types were placed in this encounter.  Meds ordered this encounter  Medications   methylPREDNISolone acetate (DEPO-MEDROL) injection 80 mg      Procedures: No procedures performed   Clinical Data: No additional findings.   Subjective: Chief Complaint  Patient presents with   Neck - Pain    HPI 88 year old male comes in again with complaints of neck pain.  Patient last seen by me July 03, 2022 for the same problem and I had wanted him to follow-up with Dr. Laurance Flatten here in the office to review the previous CT cervical spine and discuss treatment options.  For some reason patient's appointment was not scheduled and he called the office today with complaints of pain.  Most of his pain is localized to around the trapezius and left shoulder blade.   Objective: Vital Signs: BP (!) 144/78   Pulse 78   Ht '5\' 6"'$  (1.676 m)   Wt 141 lb (64 kg)   BMI 22.76 kg/m   Physical Exam Very pleasant elderly white male alert and oriented in no acute distress.  He has left-sided trapezius and scapular tenderness.  Neurologic intact. Ortho Exam  Specialty Comments:  No specialty comments  available.  Imaging: No results found.   PMFS History: Patient Active Problem List   Diagnosis Date Noted   Anemia 07/22/2022   Chronic neck pain 06/10/2022   Trigger finger, right middle finger 04/01/2022   Trigger little finger of right hand 03/27/2022   S/P TAVR (transcatheter aortic valve replacement) 11/12/2021   Dyspnea on exertion 08/05/2021   Acute on chronic diastolic heart failure ([place]) 08/05/2021   Lower extremity ulceration ([place]) 03/08/2021   Weight loss 02/19/2021   Lightheaded 45/99/7741   Periumbilical pain 42/39/5320   Fall 08/21/2020   Head trauma, initial encounter 08/21/2020   Bilateral leg edema 01/26/2020   GERD (gastroesophageal reflux disease) 01/26/2020   Chronic pain of both shoulders 12/10/2018   Easy bruising 03/20/2018   Decreased hearing of right ear 09/04/2017   Rotator cuff tear arthropathy of both shoulders 10/18/2015   Thrombocytopenia ([place]) 09/24/2015   Diverticulosis of colon without hemorrhage 07/02/2014   Hypertrophic cardiomyopathy ([place]) 03/24/2011   OBSTRUCTIVE SLEEP APNEA 11/08/2010   HYPERTENSION, PULMONARY 09/16/2010   HEARING LOSS, BILATERAL 06/18/2009   Coronary atherosclerosis 03/07/2009   Severe aortic stenosis 03/07/2009   CAROTID STENOSIS 03/07/2009   Hyperlipidemia 11/15/2008   Peripheral neuropathy, idiopathic 11/15/2008   Essential hypertension 11/15/2008   COLITIS, HX OF 11/15/2008   VENOUS  INSUFFICIENCY 04/20/2007   Gout 02/10/2007   Past Medical History:  Diagnosis Date   Anxiety    Aortic stenosis    Arthralgia    Arthritis    CAD (coronary artery disease)    Carotid stenosis    Cervicalgia    CLUSTER HEADACHE 02/10/2007   Qualifier: Diagnosis of  By: Linna Darner MD, Gwyndolyn Saxon     Colitis    Edema    Gout    Hearing loss    Herpes zoster without mention of complication    Hyperlipidemia    Hypertension    Hyperuricemia    Impacted cerumen    Migraine    Migraine    Murmur    Olecranon bursitis    OSA  (obstructive sleep apnea)    Other and unspecified hyperlipidemia    Other malaise and fatigue    Peripheral neuropathy    Polyneuropathy    Prostate cancer ([place])    Pulmonary hypertension (HCC)    S/P TAVR (transcatheter aortic valve replacement) 11/12/2021   85m S3UR via TF approach with Dr. CBurt Knackand Dr. BCyndia Bent  Unspecified hereditary and idiopathic peripheral neuropathy    Venous insufficiency     Family History  Problem Relation Age of Onset   Stroke Father        Died, 873  High blood pressure Father    Diabetes Father    Other Mother        Died, 824  Diabetes Brother        Died, 866  Diabetes Sister        Living, 817  Healthy Daughter    Healthy Son     Past Surgical History:  Procedure Laterality Date   ANGIOPLASTY     [place]    [place] N[place]A 11/12/2021   Procedure: INTRAOPERATIVE TRANSTHORACIC ECHOCARDIOGRAM;  Surgeon: CSherren Mocha MD;  [facility]  Service: Open Heart Surgery;  Laterality: N/A;   LIPOMA EXCISION     RIGHT/LEFT HEART CATH AND CORONARY ANGIOGRAPHY N/A 10/02/2021   Procedure: RIGHT/LEFT HEART CATH AND CORONARY ANGIOGRAPHY;  Surgeon: CSherren Mocha MD;  [facility];  Service: Cardiovascular;  Laterality: N/A;   TONSILLECTOMY     TRANSCATHETER AORTIC VALVE REPLACEMENT, TRANSFEMORAL N/A 11/12/2021   Procedure: TRANSCATHETER AORTIC VALVE REPLACEMENT, TRANSFEMORAL;  Surgeon: CSherren Mocha MD;  [facility]  Service: Open Heart Surgery;  Laterality: N/A;   ULTRASOUND GUIDANCE FOR VASCULAR ACCESS Bilateral 11/12/2021   Procedure: ULTRASOUND GUIDANCE FOR VASCULAR ACCESS;  Surgeon: CSherren Mocha MD;  [facility]  Service: Open Heart Surgery;  Laterality: Bilateral;   Social History   Occupational History   Occupation: retired    Comment: worked as a mAcupuncturist RETIRED  Tobacco Use   Smoking status: Never   Smokeless tobacco: Never  Vaping Use   Vaping Use: Never used  Substance and Sexual Activity   Alcohol use: Yes    Alcohol/week: 14.0 standard drinks of alcohol    Types: 14 Standard drinks or equivalent per week    Comment: 2 drink before dinner vodka    Drug use: No   Sexual activity: Never

## 2022-08-01 ENCOUNTER — Ambulatory Visit (INDEPENDENT_AMBULATORY_CARE_PROVIDER_SITE_OTHER): Payer: Medicare Other | Admitting: Orthopedic Surgery

## 2022-08-01 ENCOUNTER — Encounter: Payer: Self-pay | Admitting: Orthopedic Surgery

## 2022-08-01 ENCOUNTER — Ambulatory Visit (INDEPENDENT_AMBULATORY_CARE_PROVIDER_SITE_OTHER): Payer: Medicare Other

## 2022-08-01 VITALS — BP 128/81 | HR 63 | Ht 66.0 in | Wt 141.0 lb

## 2022-08-01 DIAGNOSIS — M542 Cervicalgia: Secondary | ICD-10-CM | POA: Diagnosis not present

## 2022-08-01 NOTE — Progress Notes (Signed)
Orthopedic Spine Surgery Office Note  Assessment: Patient is a 88 y.o. male with cervical spine pain with significant facet arthropathy and positive cervical sagittal balance   Plan: -Explained that there is not a great operation for facet arthropathy.  Surgical intervention can be done for cervical deformity.  However, I discussed that the risks of that surgery are significant especially given his age. -He does get some relief with medication so recommended continued conservative treatment.  I told him he could take 1000 mg of Tylenol 3 times a day.  I also told he can take 200 mg of Aleve twice a day.  I encouraged him to drink plenty of fluid and take the Aleve with meals. -Explained that I can do Toradol injection every 6 weeks or so as well -A referral to physical therapy was provided as well for additional pain relief.  Encouraged him to do the home exercises. -If his pain fails to improve with conservative measures, we will get an MRI of the cervical spine -Patient has tried activity modification, Aleve, Toradol -Patient should return to office in 4 weeks, repeat x-rays of cervical spine at next visit: None   Patient expressed understanding of the plan and all questions were answered to the patient's satisfaction.   ___________________________________________________________________________   History:  Patient is a 88 y.o. male who presents today for cervical spine.  Patient has had chronic neck pain but it is gotten worse within the last couple of weeks.  He has had a couple of nights of severe pain in the lower cervical spine.  He states that usually his neck pain gets worse as the day goes on and is not too bad when he wakes up in the morning.  He did get good relief with Toradol.  He is currently been using 200 mg of Aleve a day.  He is not taking any Tylenol.  He has not tried any physical therapy.  There is no trauma or injury that brought on his pain.  Pain does not radiate down  into either arm.   Weakness: Denies Difficulty with fine motor skills (e.g., buttoning shirts, handwriting): Denies Symptoms of imbalance: Denies Paresthesias and numbness: Denies Bowel or bladder incontinence: Denies Saddle anesthesia: Denies  Treatments tried: Toradol, Aleve, activity modification  Review of systems: Denies fevers and chills, night sweats, unexplained weight loss, history of cancer.  Pain has awakened him in the night within the last week  Past medical history: Neuropathy Hypertension CAD Aortic stenosis Pulmonary hypertension Sleep apnea GERD Hearing loss Bilateral rotator cuff arthropathy Trigger finger Hyperlipidemia Gout Diverticulosis  Allergies: Lisinopril, spironolactone  Past surgical history:  Valve replacement Appendectomy Cataract surgery Lipoma excision Tonsillectomy  Social history: Denies use of nicotine product (smoking, vaping, patches, smokeless) Alcohol use: Yes, 6 drinks per week Denies recreational drug use  Physical Exam:  General: no acute distress, appears stated age Neurologic: alert, answering questions appropriately, following commands Respiratory: unlabored breathing on room air, symmetric chest rise Psychiatric: appropriate affect, normal cadence to speech   MSK (spine):  -Strength exam      Left  Right Grip strength                5/5  5/5 Interosseus   5/5   5/5 Wrist extension  5/5  5/5 Wrist flexion   5/5  5/5 Elbow flexion   5/5  5/5 Deltoid    5/5  5/5  EHL    4/5  4/5 TA    5/5  5/5  GSC    5/5  5/5 Knee extension  5/5  5/5 Hip flexion   5/5  5/5  -Sensory exam    Sensation intact to light touch in L3-S1 nerve distributions of bilateral lower extremities  Sensation intact to light touch in C5-T1 nerve distributions of bilateral upper extremities  -Brachioradialis DTR: 1/4 on the left, 1/4 on the right -Biceps DTR: 1/4 on the left, 1/4 on the right -Achilles DTR: 1/4 on the left, 1/4 on  the right -Patellar tendon DTR: 0/4 on the left, 0/4 on the right  -Spurling: negative bilaterally -Hoffman sign: Negative bilaterally  -Clonus: No beats bilaterally -Gait: Slow, short stepped -Imbalance with tandem gait: Yes  Left shoulder exam: Pain through range of motion, crepitus palpable, pain is not the same as he is presenting for Right shoulder exam: Pain through range of motion, crepitus palpable, pain is not the same as he is presenting for  Tinel's at wrist: Negative bilaterally Durkan's: Negative bilaterally  Tinel's at elbow: Negative bilaterally  On inspection, has increased kyphosis of the thoracic spine and cervical spine.  Appearance of positive sagittal balance.  Appears well-balanced coronally.  Imaging: XR of the cervical spine from 08/01/2022 was independently reviewed and interpreted, showing positive sagittal cervical balance measuring about 6 cm.  Significant degenerative changes throughout the cervical spine.  There is a spondylolisthesis at C4-5.  There is a less than 3 mm change in the spondylolisthesis between flexion and extension.  CT of the cervical spine from 06/24/2022 was independently reviewed and interpreted, showing autofusion of C3-3.  Significant disc height loss and facet arthropathy at multiple levels.  No fracture or dislocation.   Patient name: Paul Mays Patient MRN: [id number] Date of visit: 08/01/22

## 2022-08-04 ENCOUNTER — Other Ambulatory Visit: Payer: Self-pay

## 2022-08-04 DIAGNOSIS — M65331 Trigger finger, right middle finger: Secondary | ICD-10-CM

## 2022-08-04 DIAGNOSIS — M65351 Trigger finger, right little finger: Secondary | ICD-10-CM

## 2022-08-04 NOTE — Telephone Encounter (Signed)
Referral has been sent.

## 2022-08-08 NOTE — Therapy (Addendum)
OUTPATIENT PHYSICAL THERAPY CERVICAL EVALUATION DISCHARGE SUMMARY   Patient Name: Paul Mays MRN: [id number] DOB:Nov 30, 1927, 88 y.o., male Today's Date: 08/08/2022    08/11/22 1353  PT Visits / Re-Eval  Visit Number 1  Number of Visits 8  Date for PT Re-Evaluation 10/13/21  Authorization  Authorization Type Medicare - FOTO 6th and 10th  Progress Note Due on Visit 10  PT Time Calculation  PT Start Time 1300  PT Stop Time 1350  PT Time Calculation (min) 50 min  PT - End of Session  Activity Tolerance Patient tolerated treatment well  Behavior During Therapy WFL for tasks assessed/performed    Past Medical History:  Diagnosis Date   Anxiety    Aortic stenosis    Arthralgia    Arthritis    CAD (coronary artery disease)    Carotid stenosis    Cervicalgia    CLUSTER HEADACHE 02/10/2007   Qualifier: Diagnosis of  By: Linna Darner MD, Gwyndolyn Saxon     Colitis    Edema    Gout    Hearing loss    Herpes zoster without mention of complication    Hyperlipidemia    Hypertension    Hyperuricemia    Impacted cerumen    Migraine    Migraine    Murmur    Olecranon bursitis    OSA (obstructive sleep apnea)    Other and unspecified hyperlipidemia    Other malaise and fatigue    Peripheral neuropathy    Polyneuropathy    Prostate cancer ([place])    Pulmonary hypertension (HCC)    S/P TAVR (transcatheter aortic valve replacement) 11/12/2021   70m S3UR via TF approach with Dr. CBurt Knackand Dr. BCyndia Bent  Unspecified hereditary and idiopathic peripheral neuropathy    Venous insufficiency    Past Surgical History:  Procedure Laterality Date   ANGIOPLASTY     w/ 2 stents in [place]    HEMORRHOID SURGERY     INTRAOPERATIVE TRANSTHORACIC ECHOCARDIOGRAM N/A 11/12/2021   Procedure: INTRAOPERATIVE TRANSTHORACIC ECHOCARDIOGRAM;  Surgeon: CSherren Mocha MD;  [facility]  Service: Open Heart Surgery;  Laterality:  N/A;   LIPOMA EXCISION     RIGHT/LEFT HEART CATH AND CORONARY ANGIOGRAPHY N/A 10/02/2021   Procedure: RIGHT/LEFT HEART CATH AND CORONARY ANGIOGRAPHY;  Surgeon: CSherren Mocha MD;  [facility];  Service: Cardiovascular;  Laterality: N/A;   TONSILLECTOMY     TRANSCATHETER AORTIC VALVE REPLACEMENT, TRANSFEMORAL N/A 11/12/2021   Procedure: TRANSCATHETER AORTIC VALVE REPLACEMENT, TRANSFEMORAL;  Surgeon: CSherren Mocha MD;  [facility]  Service: Open Heart Surgery;  Laterality: N/A;   ULTRASOUND GUIDANCE FOR VASCULAR ACCESS Bilateral 11/12/2021   Procedure: ULTRASOUND GUIDANCE FOR VASCULAR ACCESS;  Surgeon: CSherren Mocha MD;  [facility]  Service: Open Heart Surgery;  Laterality: Bilateral;   Patient Active Problem List   Diagnosis Date Noted   Anemia 07/22/2022   Chronic neck pain 06/10/2022   Trigger finger, right middle finger 04/01/2022   Trigger little finger of right hand 03/27/2022   S/P TAVR (transcatheter aortic valve replacement) 11/12/2021   Dyspnea on exertion 08/05/2021   Acute on chronic diastolic heart failure ([place] 08/05/2021   Lower extremity ulceration ([place] 03/08/2021   Weight loss 02/19/2021   Lightheaded 077/41/2878  Periumbilical pain 067/67/2094  Fall 08/21/2020   Head trauma, initial encounter 08/21/2020   Bilateral leg edema 01/26/2020   GERD (gastroesophageal reflux disease)  01/26/2020   Chronic pain of both shoulders 12/10/2018   Easy bruising 03/20/2018   Decreased hearing of right ear 09/04/2017   Rotator cuff tear arthropathy of both shoulders 10/18/2015   Thrombocytopenia ([place]) 09/24/2015   Diverticulosis of colon without hemorrhage 07/02/2014   Hypertrophic cardiomyopathy ([place]) 03/24/2011   OBSTRUCTIVE SLEEP APNEA 11/08/2010   HYPERTENSION, PULMONARY 09/16/2010   HEARING LOSS, BILATERAL 06/18/2009   Coronary atherosclerosis 03/07/2009   Severe aortic stenosis 03/07/2009   CAROTID STENOSIS 03/07/2009   Hyperlipidemia  11/15/2008   Peripheral neuropathy, idiopathic 11/15/2008   Essential hypertension 11/15/2008   COLITIS, HX OF 11/15/2008   VENOUS INSUFFICIENCY 04/20/2007   Gout 02/10/2007    PCP: None  REFERRING PROVIDER: Binnie Rail, MD  REFERRING DIAG: Cervicalgia [M54.2]   THERAPY DIAG:  No diagnosis found.  Rationale for Evaluation and Treatment: Rehabilitation  ONSET DATE: 6 months ago  SUBJECTIVE:                                                                                                                                                                                                         SUBJECTIVE STATEMENT: Pt reports to PT with cervical pain that started about 6 months ago. He mostly notes pain when sleeping. He reports sleeping in a "propped up position" prior to his MD telling him to lay flat. He states that when he was propped up his symptoms would come on causing him to have to get up and move about to help alleviate his pain. He has noted less frequent waking since changing his sleeping position. He also notes intermittent pains throughout the day, but does not recall any activities or motions that bring on pain. His imaging shows deficits in his neck, with foraminal narrowing. He has an increased cervical lordosis. Pt denies any N/T at this time.   PERTINENT HISTORY:  Anxiety, Arthritis, CAD, Gout, Hearing loss, HTN, Peripheral neuropathy, Prostate CA,   PAIN:  Are you having pain? Yes: NPRS scale: some days I have no pain, sometimes its 10/10 Pain location: Cervical spine Pain description: Intermittent sharp pain Aggravating factors: Sleeping Relieving factors: Getting up, moving around.   PRECAUTIONS: None  WEIGHT BEARING RESTRICTIONS: No  FALLS:  Has patient fallen in last 6 months? No  LIVING ENVIRONMENT: Lives with: lives with their spouse Lives in: House/apartment Stairs: No Has following equipment at home: None  OCCUPATION: Retired  PLOF:  Independent  PATIENT GOALS: Pt would like to get rid of his neck pain  NEXT MD VISIT:   OBJECTIVE:   DIAGNOSTIC FINDINGS:  C3-C4 severe right and moderate left neural foraminal narrowing. 2.   C4-C5 severe left neural foraminal narrowing. 3.   No significant spinal canal stenosis.  PATIENT SURVEYS:  FOTO 48.8200%, 62% in 10 visits.   COGNITION: Overall cognitive status: Within functional limits for tasks assessed  SENSATION: WFL  POSTURE: rounded shoulders and forward head  PALPATION: Pain with palpation in supine to C2-C3. No reported pain elsewhere.    CERVICAL ROM:   Active ROM A/PROM (deg) eval  Flexion 30  Extension 40  Right lateral flexion 20  Left lateral flexion 20  Right rotation 45  Left rotation 40   (Blank rows = not tested)  UPPER EXTREMITY ROM:  Pt has very limited ROM in his bilat shoulders, he is able to reach 100 degrees into flexion and the back of his skull with ER. He states that it is due to stiffness, but reports no pain.   UPPER EXTREMITY MMT:  Pt demonstrates general weakness throughout his bilat UE's.  CERVICAL SPECIAL TESTS:  Spurling's test: Negative   TODAY'S TREATMENT:                                                                                                                              DATE: Creating, reviewing, and completing below HEP    PATIENT EDUCATION:  Education details: Educated pt on anatomy and physiology of current symptoms, FOTO, diagnosis, prognosis, HEP,  and POC. Person educated: Patient Education method: Customer service manager Education comprehension: verbalized understanding and returned demonstration  HOME EXERCISE PROGRAM: Access Code: [email or web address] URL: [email or web address] Date: 08/11/2022 Prepared by: Rudi Heap  Exercises - Supine Chin Tuck  - 2 x daily - 7 x weekly - 2 sets - 10 reps - 2 hold  ASSESSMENT:  CLINICAL IMPRESSION: Patient referred to PT for cervical pain.  He demonstrates decreased cervical and shoulder ROM. He also has significant weakness in his bilat UE's. Pt presents with anatomical changes, with significant cervical protrusion and rounded shoulders. He is very pleasant and was able to tolerate palpation to his cervical spine with moderate pain at C2-C3 area. He has a hard time hearing, but was able to perform chin tucks with lots of visual and tactile cues. Pt provided hand out to perform exercise at home. Patient will benefit from skilled PT to address below impairments, limitations and improve overall function.  OBJECTIVE IMPAIRMENTS: decreased activity tolerance, decreased shoulder mobility, decreased ROM, decreased strength, impaired flexibility, impaired UE use, postural dysfunction, and pain.  ACTIVITY LIMITATIONS: reaching, lifting, carry,  cleaning, driving, and or occupation  PERSONAL FACTORS:  also affecting patient's functional outcome.  REHAB POTENTIAL: Good  CLINICAL DECISION MAKING: Stable/uncomplicated  EVALUATION COMPLEXITY: Low    GOALS: Short term PT Goals Target date: 08/25/2022 Pt will be I and compliant with HEP. Baseline:  Goal status: New Pt will decrease pain by 25% with flare up's Baseline: Goal status: New  Long term PT goals Target date: 09/08/2022 Pt  will improve functional cervical ROM without pain to be able to look over shoulder when driving.        Baseline:        Goal status: New Pt will improve FOTO to at least 62% functional to show improved function Baseline: Goal status: New Pt will reduce pain to overall less than 3/10 with usual activity and work activity. Baseline: Goal status: New  PLAN: PT FREQUENCY: 1x per week   PT DURATION: 4 weeks   PLANNED INTERVENTIONS (unless contraindicated): aquatic PT, Canalith repositioning, cryotherapy, Electrical stimulation, Iontophoresis with 4 mg/ml dexamethasome, Moist heat, traction, Ultrasound, gait training, Therapeutic exercise, balance  training, neuromuscular re-education, patient/family education, prosthetic training, manual techniques, passive ROM, dry needling, taping, vasopnuematic device, vestibular, spinal manipulations, joint manipulations  PLAN FOR NEXT SESSION: review HEP   PLAN FOR NEXT SESSION: Assess HEP/update PRN, continue to progress functional mobility, strengthen deep neck flexors and parascapular muscles. Decrease patients pain and help minimize functional deficits. Address sleeping positions.     Lynden Ang, PT 08/08/2022, 8:36 AM    PHYSICAL THERAPY DISCHARGE SUMMARY  Visits from Start of Care: 1  Current functional level related to goals / functional outcomes: See above   Remaining deficits: unknown   Education / Equipment: HEP   Patient agrees to discharge. Patient goals were not met. Patient is being discharged due to not returning since the last visit.  Laureen Abrahams, PT, DPT 10/16/22 12:24 PM  [facility] [address] [place], [place], [address] Phone: [phone]   Fax:  [phone]

## 2022-08-11 ENCOUNTER — Other Ambulatory Visit: Payer: Self-pay

## 2022-08-11 ENCOUNTER — Ambulatory Visit (INDEPENDENT_AMBULATORY_CARE_PROVIDER_SITE_OTHER): Payer: Medicare Other | Admitting: Physical Therapy

## 2022-08-11 DIAGNOSIS — R293 Abnormal posture: Secondary | ICD-10-CM | POA: Diagnosis not present

## 2022-08-11 DIAGNOSIS — M25612 Stiffness of left shoulder, not elsewhere classified: Secondary | ICD-10-CM

## 2022-08-11 DIAGNOSIS — M542 Cervicalgia: Secondary | ICD-10-CM | POA: Diagnosis not present

## 2022-08-11 DIAGNOSIS — M25611 Stiffness of right shoulder, not elsewhere classified: Secondary | ICD-10-CM | POA: Diagnosis not present

## 2022-08-18 ENCOUNTER — Other Ambulatory Visit: Payer: Self-pay

## 2022-08-18 ENCOUNTER — Inpatient Hospital Stay: Payer: Medicare Other | Attending: Hematology | Admitting: Hematology

## 2022-08-18 ENCOUNTER — Inpatient Hospital Stay: Payer: Medicare Other

## 2022-08-18 VITALS — BP 114/65 | HR 54 | Temp 97.7°F | Resp 16 | Wt 139.2 lb

## 2022-08-18 DIAGNOSIS — D649 Anemia, unspecified: Secondary | ICD-10-CM | POA: Diagnosis not present

## 2022-08-18 DIAGNOSIS — I10 Essential (primary) hypertension: Secondary | ICD-10-CM | POA: Diagnosis not present

## 2022-08-18 DIAGNOSIS — D696 Thrombocytopenia, unspecified: Secondary | ICD-10-CM | POA: Diagnosis not present

## 2022-08-18 DIAGNOSIS — K649 Unspecified hemorrhoids: Secondary | ICD-10-CM | POA: Diagnosis not present

## 2022-08-18 DIAGNOSIS — M109 Gout, unspecified: Secondary | ICD-10-CM | POA: Diagnosis not present

## 2022-08-18 LAB — IRON AND IRON BINDING CAPACITY (CC-WL,HP ONLY)
Iron: 63 ug/dL (ref 45–182)
Saturation Ratios: 12 % — ABNORMAL LOW (ref 17.9–39.5)
TIBC: 532 ug/dL — ABNORMAL HIGH (ref 250–450)
UIBC: 469 ug/dL — ABNORMAL HIGH (ref 117–376)

## 2022-08-18 LAB — CMP (CANCER CENTER ONLY)
ALT: 12 U/L (ref 0–44)
AST: 16 U/L (ref 15–41)
Albumin: 5 g/dL (ref 3.5–5.0)
Alkaline Phosphatase: 32 U/L — ABNORMAL LOW (ref 38–126)
Anion gap: 7 (ref 5–15)
BUN: 31 mg/dL — ABNORMAL HIGH (ref 8–23)
CO2: 27 mmol/L (ref 22–32)
Calcium: 9.7 mg/dL (ref 8.9–10.3)
Chloride: 108 mmol/L (ref 98–111)
Creatinine: 1.38 mg/dL — ABNORMAL HIGH (ref 0.61–1.24)
GFR, Estimated: 48 mL/min — ABNORMAL LOW (ref >60)
Glucose, Bld: 111 mg/dL — ABNORMAL HIGH (ref 70–99)
Potassium: 4.2 mmol/L (ref 3.5–5.1)
Sodium: 142 mmol/L (ref 135–145)
Total Bilirubin: 1.4 mg/dL — ABNORMAL HIGH (ref 0.3–1.2)
Total Protein: 7.7 g/dL (ref 6.5–8.1)

## 2022-08-18 LAB — RETICULOCYTES
Immature Retic Fract: 10.2 % (ref 2.3–15.9)
RBC.: 4.04 MIL/uL — ABNORMAL LOW (ref 4.22–5.81)
Retic Count, Absolute: 33.9 10*3/uL (ref 19.0–186.0)
Retic Ct Pct: 0.8 % (ref 0.4–3.1)

## 2022-08-18 LAB — CBC WITH DIFFERENTIAL/PLATELET
Abs Immature Granulocytes: 0.01 10*3/uL (ref 0.00–0.07)
Basophils Absolute: 0.1 10*3/uL (ref 0.0–0.1)
Basophils Relative: 1 %
Eosinophils Absolute: 0.1 10*3/uL (ref 0.0–0.5)
Eosinophils Relative: 2 %
HCT: 36.1 % — ABNORMAL LOW (ref 39.0–52.0)
Hemoglobin: 11.3 g/dL — ABNORMAL LOW (ref 13.0–17.0)
Immature Granulocytes: 0 %
Lymphocytes Relative: 17 %
Lymphs Abs: 0.9 10*3/uL (ref 0.7–4.0)
MCH: 27.6 pg (ref 26.0–34.0)
MCHC: 31.3 g/dL (ref 30.0–36.0)
MCV: 88 fL (ref 80.0–100.0)
Monocytes Absolute: 0.6 10*3/uL (ref 0.1–1.0)
Monocytes Relative: 12 %
Neutro Abs: 3.4 10*3/uL (ref 1.7–7.7)
Neutrophils Relative %: 68 %
Platelets: 143 10*3/uL — ABNORMAL LOW (ref 150–400)
RBC: 4.1 MIL/uL — ABNORMAL LOW (ref 4.22–5.81)
RDW: 18.3 % — ABNORMAL HIGH (ref 11.5–15.5)
WBC: 5 10*3/uL (ref 4.0–10.5)
nRBC: 0 % (ref 0.0–0.2)

## 2022-08-18 LAB — LACTATE DEHYDROGENASE: LDH: 229 U/L — ABNORMAL HIGH (ref 98–192)

## 2022-08-18 LAB — VITAMIN B12: Vitamin B-12: 461 pg/mL (ref 180–914)

## 2022-08-18 LAB — IMMATURE PLATELET FRACTION: Immature Platelet Fraction: 8.4 % (ref 1.2–8.6)

## 2022-08-18 NOTE — Progress Notes (Signed)
HEMATOLOGY/ONCOLOGY CONSULTATION NOTE  Date of Service: 08/18/2022  Patient Care Team: Binnie Rail, MD as PCP - General (Internal Medicine) Sherren Mocha, MD as PCP - Cardiology (Cardiology) Charlton Haws, [facility] as Pharmacist (Pharmacist) Katy Apo, MD as Consulting Physician (Ophthalmology)  CHIEF COMPLAINTS/PURPOSE OF CONSULTATION:  Bicytopenia   HISTORY OF PRESENTING ILLNESS:   Paul Mays is a wonderful 88 y.o. male who has been referred to [name] by Dr. Billey Gosling for evaluation and management of Bicytopenia.  He was previously seen by me on 04/22/2018 for Thrombocytopenia. Platelets had been low since at least 2012. During that time, his low platelet count were not worrisome.   Patient is here with his son during today's visit. He is doing well since our last visit. He is currently taking Gabapentin 600 mg and Allopurinol 300 mg.   He notes he had an Gout flare up last year, and is taking Allopurinol for it. He is not sure about what causes his gouts to flare up. His son notes that the patient does not eat red meat as much as he used to.   Patient was previously taking Vitamin B-3, Vitamin B-12, and Vitamin-D supplements.   He denies loss of appetite, but he notes he has not been eating as good as he used to. He lost around 40 lbs in the past year.   He denies abnormal bowel moments, black stools, Hematochezia, abdominal pain, back pain, fever, chills, and leg swelling.   He notes he used to take Aspirin, but discontinued and started tylenol. Patient thought tylenol was treating for the same thing as the Aspirin.   He complains of fatigue while walking and has not seen his Cardiologist or PCP for this issue.   MEDICAL HISTORY:  Past Medical History:  Diagnosis Date   Anxiety    Aortic stenosis    Arthralgia    Arthritis    CAD (coronary artery disease)    Carotid stenosis    Cervicalgia    CLUSTER HEADACHE 02/10/2007   Qualifier: Diagnosis of  By:  Linna Darner MD, Gwyndolyn Saxon     Colitis    Edema    Gout    Hearing loss    Herpes zoster without mention of complication    Hyperlipidemia    Hypertension    Hyperuricemia    Impacted cerumen    Migraine    Migraine    Murmur    Olecranon bursitis    OSA (obstructive sleep apnea)    Other and unspecified hyperlipidemia    Other malaise and fatigue    Peripheral neuropathy    Polyneuropathy    Prostate cancer ([place])    Pulmonary hypertension (HCC)    S/P TAVR (transcatheter aortic valve replacement) 11/12/2021   40m S3UR via TF approach with Dr. CBurt Knackand Dr. BCyndia Bent  Unspecified hereditary and idiopathic peripheral neuropathy    Venous insufficiency     SURGICAL HISTORY: Past Surgical History:  Procedure Laterality Date   ANGIOPLASTY     [place]    HEMORRHOID SURGERY     INTRAOPERATIVE TRANSTHORACIC ECHOCARDIOGRAM N/A 11/12/2021   Procedure: INTRAOPERATIVE TRANSTHORACIC ECHOCARDIOGRAM;  Surgeon: CSherren Mocha MD;  [place]  Service: Open Heart Surgery;  Laterality: N/A;   LIPOMA EXCISION     RIGHT/LEFT HEART CATH AND CORONARY ANGIOGRAPHY N/A 10/02/2021   Procedure: RIGHT/LEFT HEART CATH AND CORONARY ANGIOGRAPHY;  Surgeon: Sherren Mocha, MD;  Location: [place] [facility];  Service: Cardiovascular;  Laterality: N/A;   TONSILLECTOMY     TRANSCATHETER AORTIC VALVE REPLACEMENT, TRANSFEMORAL N/A 11/12/2021   Procedure: TRANSCATHETER AORTIC VALVE REPLACEMENT, TRANSFEMORAL;  Surgeon: Sherren Mocha, MD;  Location: 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 Patient finished  two years of college.    Pt has children.   Caffeine -two cups daily.   Left handed.   Retired Licensed conveyancer                    Social Determinants of Radio broadcast assistant Strain: Low Risk  (01/29/2022)   Overall Financial Resource Strain (CARDIA)    Difficulty of Paying Living Expenses: Not hard at all  Food Insecurity: No Food Insecurity (01/29/2022)   Hunger Vital Sign    Worried About Running Out of Food in the Last Year: Never true    [place] in the Last Year: Never true  Transportation Needs: No Transportation Needs (01/29/2022)   PRAPARE - Hydrologist (Medical): No    Lack of Transportation (Non-Medical): No  Physical Activity: Sufficiently Active (01/29/2022)   Exercise Vital Sign    Days of Exercise per Week: 5 days    Minutes of Exercise per Session: 30 min  Stress: No Stress Concern Present (01/29/2022)   [place]    Feeling of Stress : Not at all  Social Connections: [place] (01/29/2022)   Social Connection and Isolation Panel [NHANES]    Frequency of Communication with Friends and Family: More than three times a week    Frequency of Social Gatherings with Friends and  Family: More than three times a week    Attends Religious Services: More than 4 times per year    Active Member of [employer] or Organizations: Yes    Attends Archivist Meetings: More than 4 times per year    Marital Status: Married  Human resources officer Violence: Not At Risk (01/29/2022)   Humiliation, Afraid, Rape, and Kick questionnaire    Fear of Current or Ex-Partner: No    Emotionally Abused: No    Physically Abused: No    Sexually Abused: No    FAMILY HISTORY: Family History  Problem Relation Age of Onset  Stroke Father        Died, 83   High blood pressure Father    Diabetes Father    Other Mother        Died, 42   Diabetes Brother        Died, 82   Diabetes Sister        Living, 66   Healthy Daughter    Healthy Son     ALLERGIES:  is allergic to lisinopril and spironolactone.  MEDICATIONS:  Current Outpatient Medications  Medication Sig Dispense Refill   allopurinol (ZYLOPRIM) 300 MG tablet TAKE 1 TABLET(300 MG) BY MOUTH DAILY AS NEEDED FOR GOUT FLARE 90 tablet 0   amLODipine (NORVASC) 5 MG tablet Take 1 tablet (5 mg total) by mouth daily. 180 tablet 3   amoxicillin (AMOXIL) 500 MG tablet Take 4 tablets (2,000 mg total) by mouth as directed. 1 HOUR PRIOR TO DENTAL APPOINTMENTS 12 tablet 6   aspirin EC 81 MG tablet Take 1 tablet (81 mg total) by mouth daily.     Cholecalciferol (VITAMIN D3) 50 MCG (2000 UT) capsule Take 2,000 Units by mouth daily.     furosemide (LASIX) 40 MG tablet Take 1 tablet (40 mg total) by mouth daily. (Patient not taking: Reported on 07/22/2022) 90 tablet 2   gabapentin (NEURONTIN) 600 MG tablet Take 1 tablet (600 mg total) by mouth at bedtime. 90 tablet 1   hydrALAZINE (APRESOLINE) 25 MG tablet TAKE 1 TABLET(25 MG) BY MOUTH IN THE MORNING AND AT BEDTIME 60 tablet 10   metoprolol tartrate (LOPRESSOR) 25 MG tablet Take 1 tablet (25 mg total) by mouth 2 (two) times daily. 180 tablet 3   potassium chloride (KLOR-CON M) 10 MEQ tablet Take 10  mEq by mouth as needed. Only take if you use extra Lasix     pravastatin (PRAVACHOL) 40 MG tablet Take 1 tablet (40 mg total) by mouth daily. 90 tablet 3   traMADol (ULTRAM) 50 MG tablet Take 1 tablet (50 mg total) by mouth every 8 (eight) hours as needed. 15 tablet 0   vitamin B-12 (CYANOCOBALAMIN) 500 MCG tablet Take 500 mcg by mouth daily.     Current Facility-Administered Medications  Medication Dose Route Frequency Provider Last Rate Last Admin   methylPREDNISolone acetate (DEPO-MEDROL) injection 80 mg  80 mg Intramuscular Once Lanae Crumbly, PA-C        REVIEW OF SYSTEMS:    10 Point review of Systems was done is negative except as noted above.  PHYSICAL EXAMINATION: ECOG PERFORMANCE STATUS: 2 - Symptomatic, <50% confined to bed  . Vitals:   08/18/22 1425  BP: 114/65  Pulse: (!) 54  Resp: 16  Temp: 97.7 F (36.5 C)  SpO2: 96%   Filed Weights   08/18/22 1425  Weight: 139 lb 3.2 oz (63.1 kg)   .Body mass index is 22.47 kg/m.  GENERAL:alert, in no acute distress and comfortable SKIN: no acute rashes, no significant lesions EYES: conjunctiva are pink and non-injected, sclera anicteric OROPHARYNX: MMM, no exudates, no oropharyngeal erythema or ulceration NECK: supple, no JVD LYMPH:  no palpable lymphadenopathy in the cervical, axillary or inguinal regions LUNGS: clear to auscultation b/l with normal respiratory effort HEART: regular rate & rhythm ABDOMEN:  normoactive bowel sounds , non tender, not distended. No palpable hepatosplenomegaly Extremity: no pedal edema PSYCH: alert & oriented x 3 with fluent speech NEURO: no focal motor/sensory deficits  LABORATORY DATA:  I have reviewed the data as listed.  Latest Ref Rng & Units 08/18/2022    3:36 PM 08/18/2022    3:33 PM 07/22/2022    3:32 PM  CBC  WBC 4.0 - 10.5 K/uL  5.0  4.6   Hemoglobin 13.0 - 17.0 g/dL  11.3  10.3   Hematocrit 37.5 - 51.0 % 36.9  36.1  31.9   Platelets 150 - 400 K/uL  143  108.0     .CBC    Component Value Date/Time   WBC 5.0 08/18/2022 1533   RBC 4.04 (L) 08/18/2022 1536   RBC 4.10 (L) 08/18/2022 1533   HGB 11.3 (L) 08/18/2022 1533   HGB 12.5 (L) 09/12/2021 1123   HCT 36.9 (L) 08/18/2022 1536   HCT 36.1 (L) 08/18/2022 1533   PLT 143 (L) 08/18/2022 1533   PLT 120 (L) 09/12/2021 1123   MCV 88.0 08/18/2022 1533   MCV 93 09/12/2021 1123   MCH 27.6 08/18/2022 1533   MCHC 31.3 08/18/2022 1533   RDW 18.3 (H) 08/18/2022 1533   RDW 15.3 09/12/2021 1123   LYMPHSABS 0.9 08/18/2022 1533   MONOABS 0.6 08/18/2022 1533   EOSABS 0.1 08/18/2022 1533   BASOSABS 0.1 08/18/2022 1533    .    Latest Ref Rng & Units 08/18/2022    3:33 PM 07/22/2022    3:32 PM 12/13/2021    9:04 AM  CMP  Glucose 70 - 99 mg/dL 111  95  83   BUN 8 - 23 mg/dL _0 Creatinine 0.61 - 1.24 mg/dL 1.38  1.18  1.01   Sodium 135 - 145 mmol/L 142  139  139   Potassium 3.5 - 5.1 mmol/L 4.2  4.7  5.0   Chloride 98 - 111 mmol/L 108  107  105   CO2 22 - 32 mmol/L _1 Calcium 8.9 - 10.3 mg/dL 9.7  9.4  9.5   Total Protein 6.5 - 8.1 g/dL 7.7  7.0    Total Bilirubin 0.3 - 1.2 mg/dL 1.4  2.0    Alkaline Phos 38 - 126 U/L 32  43    AST 15 - 41 U/L 16  19    ALT 0 - 44 U/L 12  13      RADIOGRAPHIC STUDIES: I have personally reviewed the radiological images as listed and agreed with the findings in the report. No results found.  ASSESSMENT & PLAN:   88 yo with   1) Normocytic Anemia  2) Thrombocytoopenia  ?MDS vs vitamin deficiency  PLAN: -Discussed lab results from 07/22/2022. Labs showed his WBC of 4.6 K, Hemoglobin of 10.3 K, and platelets of 108 K. He is slightly anemic.  -Discussed what could cause low platelets. He has always had low platelet counts since around 2012.  -Recommended to start B-complex empirically and continue Vitamin-D supplements.  -Discussed the difference between Aspirin and Tylenol.  -Discussed when a bone marrow biopsy is needed.  -lab workup  ordered as per schedule. . Orders Placed This Encounter  Procedures   CBC with Differential/Platelet    Standing Status:   Future    Number of Occurrences:   1    Standing Expiration Date:   08/19/2023   CMP ([place] only)    Standing Status:   Future    Number of Occurrences:   1    Standing Expiration Date:   08/19/2023   Ferritin    Standing Status:   Future    Number  of Occurrences:   1    Standing Expiration Date:   08/19/2023   Multiple Myeloma Panel (SPEP&IFE w/QIG)    Standing Status:   Future    Number of Occurrences:   1    Standing Expiration Date:   08/19/2023   Vitamin B12    Standing Status:   Future    Number of Occurrences:   1    Standing Expiration Date:   08/18/2023   Folate RBC    Standing Status:   Future    Number of Occurrences:   1    Standing Expiration Date:   08/19/2023   [place], serum    Standing Status:   Future    Number of Occurrences:   1    Standing Expiration Date:   08/18/2023   Immature Platelet Fraction    Standing Status:   Future    Number of Occurrences:   1    Standing Expiration Date:   08/19/2023   Reticulocytes    Standing Status:   Future    Number of Occurrences:   1    Standing Expiration Date:   08/19/2023   Lactate dehydrogenase    Standing Status:   Future    Number of Occurrences:   1    Standing Expiration Date:   08/19/2023   Haptoglobin    Standing Status:   Future    Number of Occurrences:   1    Standing Expiration Date:   08/19/2023    FOLLOW UP: Labs today Phone visit with Dr Irene Limbo in 1 week  .The total time spent in the appointment was 50 minutes* .  All of the patient's questions were answered with apparent satisfaction. The patient knows to call the clinic with any problems, questions or concerns.   Sullivan Lone MD MS AAHIVMS [facility] Hematology/Oncology Physician [facility]  .*Total Encounter Time as defined by the Centers for Medicare and Medicaid Services includes, in  addition to the face-to-face time of a patient visit (documented in the note above) non-face-to-face time: obtaining and reviewing outside history, ordering and reviewing medications, tests or procedures, care coordination (communications with other health care professionals or caregivers) and documentation in the medical record.    Zettie Cooley, am acting as a scribe for Sullivan Lone, MD.  Sullivan Lone MD [place] AAHIVMS [facility] Hematology/Oncology Physician [facility]  (Office):       [phone] (Work cell):  [phone] (Fax):           [phone]  08/18/2022 11:49 AM

## 2022-08-19 ENCOUNTER — Encounter: Payer: Medicare Other | Admitting: Physical Therapy

## 2022-08-19 DIAGNOSIS — M65331 Trigger finger, right middle finger: Secondary | ICD-10-CM | POA: Insufficient documentation

## 2022-08-19 DIAGNOSIS — M65351 Trigger finger, right little finger: Secondary | ICD-10-CM | POA: Diagnosis not present

## 2022-08-19 DIAGNOSIS — M6530 Trigger finger, unspecified finger: Secondary | ICD-10-CM | POA: Diagnosis not present

## 2022-08-19 LAB — FERRITIN: Ferritin: 19 ng/mL — ABNORMAL LOW (ref 24–336)

## 2022-08-19 LAB — FOLATE RBC
Folate, Hemolysate: 382 ng/mL
Folate, RBC: 1035 ng/mL (ref >498)
Hematocrit: 36.9 % — ABNORMAL LOW (ref 37.5–51.0)

## 2022-08-19 LAB — HAPTOGLOBIN: Haptoglobin: 80 mg/dL (ref 38–329)

## 2022-08-21 LAB — MULTIPLE MYELOMA PANEL, SERUM
Albumin SerPl Elph-Mcnc: 4.2 g/dL (ref 2.9–4.4)
Albumin/Glob SerPl: 1.7 (ref 0.7–1.7)
Alpha 1: 0.3 g/dL (ref 0.0–0.4)
Alpha2 Glob SerPl Elph-Mcnc: 0.6 g/dL (ref 0.4–1.0)
B-Globulin SerPl Elph-Mcnc: 0.9 g/dL (ref 0.7–1.3)
Gamma Glob SerPl Elph-Mcnc: 0.8 g/dL (ref 0.4–1.8)
Globulin, Total: 2.6 g/dL (ref 2.2–3.9)
IgA: 95 mg/dL (ref 61–437)
IgG (Immunoglobin G), Serum: 826 mg/dL (ref 603–1613)
IgM (Immunoglobulin M), Srm: 39 mg/dL (ref 15–143)
Total Protein ELP: 6.8 g/dL (ref 6.0–8.5)

## 2022-08-26 ENCOUNTER — Encounter: Payer: Medicare Other | Admitting: Physical Therapy

## 2022-08-28 LAB — COPPER, SERUM: Copper: 113 ug/dL (ref 69–132)

## 2022-09-01 ENCOUNTER — Inpatient Hospital Stay (HOSPITAL_BASED_OUTPATIENT_CLINIC_OR_DEPARTMENT_OTHER): Payer: Medicare Other | Admitting: Hematology

## 2022-09-01 ENCOUNTER — Other Ambulatory Visit: Payer: Self-pay

## 2022-09-01 DIAGNOSIS — K649 Unspecified hemorrhoids: Secondary | ICD-10-CM | POA: Diagnosis not present

## 2022-09-01 DIAGNOSIS — D649 Anemia, unspecified: Secondary | ICD-10-CM

## 2022-09-01 DIAGNOSIS — M109 Gout, unspecified: Secondary | ICD-10-CM | POA: Diagnosis not present

## 2022-09-01 DIAGNOSIS — I10 Essential (primary) hypertension: Secondary | ICD-10-CM | POA: Diagnosis not present

## 2022-09-01 DIAGNOSIS — D696 Thrombocytopenia, unspecified: Secondary | ICD-10-CM | POA: Diagnosis not present

## 2022-09-01 MED ORDER — POLYSACCHARIDE IRON COMPLEX 150 MG PO CAPS: 150.0000 mg | ORAL_CAPSULE | Freq: Every day | ORAL | 5 refills | Status: DC

## 2022-09-01 NOTE — Progress Notes (Shared)
HEMATOLOGY/ONCOLOGY CLINIC NOTE PHONE  Date of Service: 09/01/2022  Patient Care Team: Binnie Rail, MD as PCP - General (Internal Medicine) Sherren Mocha, MD as PCP - Cardiology (Cardiology) Charlton Haws, [facility] as Pharmacist (Pharmacist) Katy Apo, MD as Consulting Physician (Ophthalmology)  CHIEF COMPLAINTS/PURPOSE OF CONSULTATION:  Bicytopenia   HISTORY OF PRESENTING ILLNESS:   Paul Mays is a wonderful 88 y.o. male who has been referred to [name] by Dr. Billey Gosling for evaluation and management of Bicytopenia.  He was previously seen by me on 04/22/2018 for Thrombocytopenia. Platelets had been low since at least 2012. During that time, his low platelet count were not worrisome.   Patient is here with his son during today's visit. He is doing well since our last visit. He is currently taking Gabapentin 600 mg and Allopurinol 300 mg.   He notes he had an Gout flare up last year, and is taking Allopurinol for it. He is not sure about what causes his gouts to flare up. His son notes that the patient does not eat red meat as much as he used to.   Patient was previously taking Vitamin B-3, Vitamin B-12, and Vitamin-D supplements.   He denies loss of appetite, but he notes he has not been eating as good as he used to. He lost around 40 lbs in the past year.   He denies abnormal bowel moments, black stools, Hematochezia, abdominal pain, back pain, fever, chills, and leg swelling.   He notes he used to take Aspirin, but discontinued and started tylenol. Patient thought tylenol was treating for the same thing as the Aspirin.   He complains of fatigue while walking and has not seen his Cardiologist or PCP for this issue.   Interval History:  Paul Mays is a wonderful 88 y.o. male who is here for continued evaluation and management of Bicytopenia.  .I connected with [name] on 09/01/2022 at  8:40 AM EST by telephone visit and verified that I am speaking with  the correct person using two identifiers.   Patient was last seen by me on 08/18/2022 and was doing well overall, but complained of mild fatigue.   Patient reports he is doing well without any new medical concerns since our last visit.   He denies any history of iron deficiency. He denies abnormal bleeding loss or black stool.   He reports he has been having hemorrhoidal bleeding since last month.   He is currently taking vitamin-D and Vitamin-B supplements.   I discussed the limitations, risks, security and privacy concerns of performing an evaluation and management service by telemedicine and the availability of in-person appointments. I also discussed with the patient that there may be a patient responsible charge related to this service. The patient expressed understanding and agreed to proceed.   Other persons participating in the visit and their role in the encounter: None   Patient's location: Home  Provider's location: [facility]   Chief Complaint: Bicytopenia     MEDICAL HISTORY:  Past Medical History:  Diagnosis Date   Anxiety    Aortic stenosis    Arthralgia    Arthritis    CAD (coronary artery disease)    Carotid stenosis    Cervicalgia    CLUSTER HEADACHE 02/10/2007   Qualifier: Diagnosis of  By: Linna Darner MD, Gwyndolyn Saxon     Colitis    Edema    Gout    Hearing loss    Herpes zoster without mention of  complication    Hyperlipidemia    Hypertension    Hyperuricemia    Impacted cerumen    Migraine    Migraine    Murmur    Olecranon bursitis    OSA (obstructive sleep apnea)    Other and unspecified hyperlipidemia    Other malaise and fatigue    Peripheral neuropathy    Polyneuropathy    Prostate cancer ([place])    Pulmonary hypertension (HCC)    S/P TAVR (transcatheter aortic valve replacement) 11/12/2021   66m S3UR via TF approach with Dr. CBurt Knackand Dr. BCyndia Bent  Unspecified hereditary and idiopathic peripheral neuropathy    Venous insufficiency     SURGICAL  HISTORY: Past Surgical History:  Procedure Laterality Date   ANGIOPLASTY     w/ 2 stents in [place]    HEMORRHOID SURGERY     INTRAOPERATIVE TRANSTHORACIC ECHOCARDIOGRAM N/A 11/12/2021   Procedure: INTRAOPERATIVE TRANSTHORACIC ECHOCARDIOGRAM;  Surgeon: CSherren Mocha MD;  [facility]  Service: Open Heart Surgery;  Laterality: N/A;   LIPOMA EXCISION     RIGHT/LEFT HEART CATH AND CORONARY ANGIOGRAPHY N/A 10/02/2021   Procedure: RIGHT/LEFT HEART CATH AND CORONARY ANGIOGRAPHY;  Surgeon: CSherren Mocha MD;  [facility];  Service: Cardiovascular;  Laterality: N/A;   TONSILLECTOMY     TRANSCATHETER AORTIC VALVE REPLACEMENT, TRANSFEMORAL N/A 11/12/2021   Procedure: TRANSCATHETER AORTIC VALVE REPLACEMENT, TRANSFEMORAL;  Surgeon: CSherren Mocha MD;  [place] [facility]  Service: Open Heart Surgery;  Laterality: N/A;   ULTRASOUND GUIDANCE FOR VASCULAR ACCESS Bilateral 11/12/2021   Procedure: ULTRASOUND GUIDANCE FOR VASCULAR ACCESS;  Surgeon: CSherren Mocha MD;  [facility] [facility]  Service: Open Heart Surgery;  Laterality: Bilateral;    SOCIAL HISTORY: Social History   Socioeconomic History   Marital status: Married    Spouse name: SVinnie Level  Number of children: 2   Years of education: college   Highest education level: Not on file  Occupational History   Occupation: retired    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 Patient finished   two years of college.    Pt has children.   Caffeine -two cups daily.   Left handed.   Retired mLicensed conveyancer                   Social Determinants of HRadio broadcast assistantStrain: Low Risk  (01/29/2022)   Overall Financial Resource Strain (CARDIA)    Difficulty of Paying Living Expenses: Not hard at all  Food Insecurity: No Food Insecurity (01/29/2022)   Hunger Vital Sign    Worried About Running Out of Food in the Last Year: Never true    Ran Out of Food in the Last Year: Never true  Transportation Needs: No Transportation Needs (01/29/2022)   PRAPARE - Transportation    Lack of  Transportation (Medical): No    Lack of Transportation (Non-Medical): No  Physical Activity: Sufficiently Active (01/29/2022)   Exercise Vital Sign    Days of Exercise per Week: 5 days    Minutes of Exercise per Session: 30 min  Stress: No Stress Concern Present (01/29/2022)   [place]    Feeling of Stress : Not at all  Social Connections: [place] (01/29/2022)   Social Connection and Isolation Panel [NHANES]    Frequency of Communication with Friends and Family: More than three times a week    Frequency of Social Gatherings with Friends and Family: More than three times a week    Attends Religious Services: More than 4 times per year    Active Member of [employer] or Organizations: Yes    Attends Music therapist: More than 4 times per year    Marital Status: Married  Human resources officer Violence: Not At Risk (01/29/2022)   Humiliation, Afraid, Rape, and Kick questionnaire    Fear of Current or Ex-Partner: No    Emotionally Abused: No    Physically Abused: No    Sexually Abused: No    FAMILY HISTORY: Family History  Problem Relation Age of Onset   Stroke Father        Died, 83   High blood pressure Father    Diabetes Father    Other Mother        Died, 32   Diabetes Brother        Died, 82    Diabetes Sister        Living, 57   Healthy Daughter    Healthy Son     ALLERGIES:  is allergic to lisinopril and spironolactone.  MEDICATIONS:  Current Outpatient Medications  Medication Sig Dispense Refill   allopurinol (ZYLOPRIM) 300 MG tablet TAKE 1 TABLET(300 MG) BY MOUTH DAILY AS NEEDED FOR GOUT FLARE 90 tablet 0   amLODipine (NORVASC) 5 MG tablet Take 1 tablet (5 mg total) by mouth daily. 180 tablet 3   amoxicillin (AMOXIL) 500 MG tablet Take 4 tablets (2,000 mg total) by mouth as directed. 1 HOUR PRIOR TO DENTAL APPOINTMENTS 12 tablet 6   aspirin EC 81 MG tablet Take 1 tablet (81 mg total) by mouth daily.     Cholecalciferol (VITAMIN D3) 50 MCG (2000 UT) capsule Take 2,000 Units by mouth daily. (Patient not taking: Reported on 08/18/2022)     furosemide (LASIX) 40 MG tablet Take 1 tablet (40 mg total) by mouth daily. (Patient not taking: Reported on 07/22/2022) 90 tablet 2   gabapentin (NEURONTIN) 600 MG tablet Take 1 tablet (600 mg total) by mouth at bedtime. 90 tablet 1   hydrALAZINE (APRESOLINE) 25 MG tablet TAKE 1 TABLET(25 MG) BY MOUTH IN THE MORNING AND AT BEDTIME 60 tablet 10   metoprolol tartrate (LOPRESSOR) 25 MG tablet Take 1 tablet (25 mg total) by mouth 2 (two) times daily. 180 tablet 3   potassium chloride (KLOR-CON M) 10 MEQ tablet Take 10 mEq by mouth as needed. Only take if you use extra Lasix (Patient not taking: Reported on 08/18/2022)     pravastatin (PRAVACHOL) 40 MG tablet Take 1 tablet (40 mg total) by mouth daily. 90 tablet 3   traMADol (ULTRAM) 50 MG tablet Take 1 tablet (50 mg total) by mouth every 8 (eight) hours as needed. 15 tablet 0   vitamin B-12 (CYANOCOBALAMIN) 500 MCG tablet Take 500 mcg by mouth daily. (Patient  not taking: Reported on 08/18/2022)     Current Facility-Administered Medications  Medication Dose Route Frequency Provider Last Rate Last Admin   methylPREDNISolone acetate (DEPO-MEDROL) injection 80 mg  80 mg Intramuscular Once Lanae Crumbly, PA-C        REVIEW OF SYSTEMS:    10 Point review of Systems was done is negative except as noted above.  TELEHEALTH VISIT.  LABORATORY DATA:  I have reviewed the data as listed.    Latest Ref Rng & Units 08/18/2022    3:36 PM 08/18/2022    3:33 PM 07/22/2022    3:32 PM  CBC  WBC 4.0 - 10.5 K/uL  5.0  4.6   Hemoglobin 13.0 - 17.0 g/dL  11.3  10.3   Hematocrit 37.5 - 51.0 % 36.9  36.1  31.9   Platelets 150 - 400 K/uL  143  108.0    .CBC    Component Value Date/Time   WBC 5.0 08/18/2022 1533   RBC 4.04 (L) 08/18/2022 1536   RBC 4.10 (L) 08/18/2022 1533   HGB 11.3 (L) 08/18/2022 1533   HGB 12.5 (L) 09/12/2021 1123   HCT 36.9 (L) 08/18/2022 1536   HCT 36.1 (L) 08/18/2022 1533   PLT 143 (L) 08/18/2022 1533   PLT 120 (L) 09/12/2021 1123   MCV 88.0 08/18/2022 1533   MCV 93 09/12/2021 1123   MCH 27.6 08/18/2022 1533   MCHC 31.3 08/18/2022 1533   RDW 18.3 (H) 08/18/2022 1533   RDW 15.3 09/12/2021 1123   LYMPHSABS 0.9 08/18/2022 1533   MONOABS 0.6 08/18/2022 1533   EOSABS 0.1 08/18/2022 1533   BASOSABS 0.1 08/18/2022 1533    .    Latest Ref Rng & Units 08/18/2022    3:33 PM 07/22/2022    3:32 PM 12/13/2021    9:04 AM  CMP  Glucose 70 - 99 mg/dL 111  95  83   BUN 8 - 23 mg/dL '31  26  25   '$ Creatinine 0.61 - 1.24 mg/dL 1.38  1.18  1.01   Sodium 135 - 145 mmol/L 142  139  139   Potassium 3.5 - 5.1 mmol/L 4.2  4.7  5.0   Chloride 98 - 111 mmol/L 108  107  105   CO2 22 - 32 mmol/L '27  25  22   '$ Calcium 8.9 - 10.3 mg/dL 9.7  9.4  9.5   Total Protein 6.5 - 8.1 g/dL 7.7  7.0    Total Bilirubin 0.3 - 1.2 mg/dL 1.4  2.0    Alkaline Phos 38 - 126 U/L 32  43    AST 15 - 41 U/L 16  19    ALT 0 - 44 U/L 12  13      RADIOGRAPHIC STUDIES: I have personally reviewed the radiological images as listed and agreed with the findings in the report. No results found.  ASSESSMENT & PLAN:   88 yo with   1) Normocytic Anemia  2) Thrombocytoopenia  ?MDS vs vitamin  deficiency  PLAN: -Discussed lab results from 08/18/2022. Labs showed his WBC of 5.0 K, Hemoglobin of 11.3 K, and platelets of 143 K. He is iron deficient.  -Recommend to start Iron supplement, 150 mg. Discussed the option of IV Iron.  -Discussed the option of gastrointestinal workup.  -Recommend to start B-Complex and continue vitamin-D supplements.  . No orders of the defined types were placed in this encounter.   FOLLOW UP: RTC with Dr Irene Limbo with labs in  4 months  The total time spent in the appointment was *** minutes* .  All of the patient's questions were answered with apparent satisfaction. The patient knows to call the clinic with any problems, questions or concerns.   Sullivan Lone MD MS AAHIVMS [facility] Hematology/Oncology Physician [facility]  .*Total Encounter Time as defined by the Centers for Medicare and Medicaid Services includes, in addition to the face-to-face time of a patient visit (documented in the note above) non-face-to-face time: obtaining and reviewing outside history, ordering and reviewing medications, tests or procedures, care coordination (communications with other health care professionals or caregivers) and documentation in the medical record.   Zettie Cooley, am acting as a Education administrator for Sullivan Lone, MD.

## 2022-09-02 ENCOUNTER — Encounter: Payer: Medicare Other | Admitting: Physical Therapy

## 2022-09-09 ENCOUNTER — Encounter: Payer: Medicare Other | Admitting: Physical Therapy

## 2022-09-12 ENCOUNTER — Ambulatory Visit: Payer: Medicare Other | Admitting: Orthopedic Surgery

## 2022-09-30 ENCOUNTER — Other Ambulatory Visit: Payer: Self-pay | Admitting: Internal Medicine

## 2022-09-30 DIAGNOSIS — G609 Hereditary and idiopathic neuropathy, unspecified: Secondary | ICD-10-CM

## 2022-09-30 MED ORDER — GABAPENTIN 600 MG PO TABS: 600.0000 mg | ORAL_TABLET | Freq: Every day | ORAL | 1 refills | Status: DC

## 2022-10-08 ENCOUNTER — Encounter: Payer: Medicare Other | Admitting: Physical Therapy

## 2022-10-09 ENCOUNTER — Ambulatory Visit: Payer: Medicare Other | Admitting: Orthopedic Surgery

## 2022-10-10 ENCOUNTER — Ambulatory Visit: Payer: Medicare Other | Admitting: Internal Medicine

## 2022-10-14 ENCOUNTER — Other Ambulatory Visit: Payer: Self-pay | Admitting: Internal Medicine

## 2022-10-27 ENCOUNTER — Ambulatory Visit: Payer: Medicare Other | Admitting: Internal Medicine

## 2022-10-27 DIAGNOSIS — R972 Elevated prostate specific antigen [PSA]: Secondary | ICD-10-CM | POA: Diagnosis not present

## 2022-10-28 ENCOUNTER — Encounter: Payer: Self-pay | Admitting: Cardiovascular Disease

## 2022-10-28 ENCOUNTER — Ambulatory Visit (HOSPITAL_COMMUNITY): Payer: Medicare Other | Attending: Cardiovascular Disease | Admitting: Cardiovascular Disease

## 2022-10-28 ENCOUNTER — Ambulatory Visit (HOSPITAL_BASED_OUTPATIENT_CLINIC_OR_DEPARTMENT_OTHER): Payer: Medicare Other

## 2022-10-28 VITALS — BP 104/60 | HR 71 | Ht 64.0 in | Wt 146.8 lb

## 2022-10-28 DIAGNOSIS — I5032 Chronic diastolic (congestive) heart failure: Secondary | ICD-10-CM | POA: Diagnosis not present

## 2022-10-28 DIAGNOSIS — I2510 Atherosclerotic heart disease of native coronary artery without angina pectoris: Secondary | ICD-10-CM | POA: Diagnosis not present

## 2022-10-28 DIAGNOSIS — Z79899 Other long term (current) drug therapy: Secondary | ICD-10-CM | POA: Insufficient documentation

## 2022-10-28 DIAGNOSIS — R600 Localized edema: Secondary | ICD-10-CM | POA: Insufficient documentation

## 2022-10-28 DIAGNOSIS — Z952 Presence of prosthetic heart valve: Secondary | ICD-10-CM

## 2022-10-28 DIAGNOSIS — I4891 Unspecified atrial fibrillation: Secondary | ICD-10-CM | POA: Diagnosis not present

## 2022-10-28 LAB — ECHOCARDIOGRAM COMPLETE
AR max vel: 2.3 cm2
AV Area VTI: 2.45 cm2
AV Area mean vel: 2.47 cm2
AV Mean grad: 11 mmHg
AV Peak grad: 19.5 mmHg
Ao pk vel: 2.21 m/s
Area-P 1/2: 5.1 cm2
MV M vel: 5.35 m/s
MV Peak grad: 114.5 mmHg
S' Lateral: 2.6 cm

## 2022-10-28 MED ORDER — FUROSEMIDE 40 MG PO TABS: 40.0000 mg | ORAL_TABLET | Freq: Every day | ORAL | 3 refills | Status: DC

## 2022-10-28 MED ORDER — POTASSIUM CHLORIDE CRYS ER 10 MEQ PO TBCR: EXTENDED_RELEASE_TABLET | ORAL | 3 refills | Status: DC

## 2022-10-28 MED ORDER — APIXABAN 5 MG PO TABS: 5.0000 mg | ORAL_TABLET | Freq: Two times a day (BID) | ORAL | 3 refills | Status: DC

## 2022-10-28 NOTE — Progress Notes (Signed)
Cardiology Office Note:    Date:  10/28/2022   ID:  Paul Mays, DOB Feb 10, 1928, MRN [id number]  PCP:  Binnie Rail, MD   [place] Providers Cardiologist:  Sherren Mocha, MD     Referring MD: Binnie Rail, MD   Chief Complaint  Patient presents with   Shortness of Breath    History of Present Illness:    Paul Mays is a 89 y.o. 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  The patient was diagnosed with severe low-flow low gradient aortic stenosis along with moderate aortic insufficiency and he underwent TAVR via a transfemoral approach in February 2023.  He presents for 1 year follow-up.  The patient reports exertional dyspnea and fatigue.  He also complains of leg swelling.  He denies orthopnea, PND, chest pain, or chest pressure.  He does not take his diuretic because of frequent urination.  When he was last seen, amlodipine was discontinued because of leg swelling.  The patient is here with his son today.  He reports no other pertinent symptoms at this time.  Past Medical History:  Diagnosis Date   Anxiety    Aortic stenosis    Arthralgia    Arthritis    CAD (coronary artery disease)    Carotid stenosis    Cervicalgia    CLUSTER HEADACHE 02/10/2007   Qualifier: Diagnosis of  By: Linna Darner MD, Gwyndolyn Saxon     Colitis    Edema    Gout    Hearing loss    Herpes zoster without mention of complication    Hyperlipidemia    Hypertension    Hyperuricemia    Impacted cerumen    Migraine    Migraine    Murmur    Olecranon bursitis    OSA (obstructive sleep apnea)    Other and unspecified hyperlipidemia    Other malaise and fatigue    Peripheral neuropathy    Polyneuropathy    Prostate cancer ([place])    Pulmonary hypertension (HCC)    S/P TAVR (transcatheter aortic valve replacement) 11/12/2021   49m S3UR via TF approach with Dr. CBurt Knackand Dr. BCyndia Bent   Unspecified hereditary and idiopathic peripheral neuropathy    Venous insufficiency     Past Surgical History:  Procedure Laterality Date   ANGIOPLASTY     w/ 2 stents in 1TamaBilateral    HEMORRHOID SURGERY     INTRAOPERATIVE TRANSTHORACIC ECHOCARDIOGRAM N/A 11/12/2021   Procedure: INTRAOPERATIVE TRANSTHORACIC ECHOCARDIOGRAM;  Surgeon: CSherren Mocha MD;  [facility]  Service: Open Heart Surgery;  Laterality: N/A;   LIPOMA EXCISION     RIGHT/LEFT HEART CATH AND CORONARY ANGIOGRAPHY N/A 10/02/2021   Procedure: RIGHT/LEFT HEART CATH AND CORONARY ANGIOGRAPHY;  Surgeon: CSherren Mocha MD;  [facility];  Service: Cardiovascular;  Laterality: N/A;   TONSILLECTOMY     TRANSCATHETER AORTIC VALVE REPLACEMENT, TRANSFEMORAL N/A 11/12/2021   Procedure: TRANSCATHETER AORTIC VALVE REPLACEMENT, TRANSFEMORAL;  Surgeon: CSherren Mocha MD;  [place] [facility]  Service: Open Heart Surgery;  Laterality: N/A;   ULTRASOUND GUIDANCE FOR VASCULAR ACCESS Bilateral 11/12/2021   Procedure: ULTRASOUND GUIDANCE FOR VASCULAR ACCESS;  Surgeon: CSherren Mocha MD;  [facility] [facility]  Service: Open Heart Surgery;  Laterality: Bilateral;    Current Medications: Current Meds  Medication Sig   allopurinol (ZYLOPRIM)  300 MG tablet TAKE 1 TABLET(300 MG) BY MOUTH DAILY AS NEEDED FOR GOUT FLARE   amoxicillin (AMOXIL) 500 MG tablet Take 4 tablets (2,000 mg total) by mouth as directed. 1 HOUR PRIOR TO DENTAL APPOINTMENTS   apixaban (ELIQUIS) 5 MG TABS tablet Take 1 tablet (5 mg total) by mouth 2 (two) times daily.   Cholecalciferol (VITAMIN D3) 50 MCG (2000 UT) capsule Take 2,000 Units by mouth daily.   gabapentin (NEURONTIN) 600 MG tablet Take 1 tablet (600 mg total) by mouth at bedtime.   hydrALAZINE (APRESOLINE) 25 MG tablet TAKE 1 TABLET(25 MG) BY MOUTH IN THE MORNING AND AT BEDTIME   iron polysaccharides (NIFEREX) 150 MG capsule Take  1 capsule (150 mg total) by mouth daily.   metoprolol tartrate (LOPRESSOR) 25 MG tablet Take 1 tablet (25 mg total) by mouth 2 (two) times daily.   pravastatin (PRAVACHOL) 40 MG tablet Take 1 tablet (40 mg total) by mouth daily.   vitamin B-12 (CYANOCOBALAMIN) 500 MCG tablet Take 500 mcg by mouth daily.   [DISCONTINUED] aspirin EC 81 MG tablet Take 1 tablet (81 mg total) by mouth daily.   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 Patient finished  two years of college.    Pt has children.   Caffeine -two cups daily.   Left handed.   Retired Licensed conveyancer                    Social Determinants of Radio broadcast assistant Strain: Low Risk  (01/29/2022)   Overall Financial Resource Strain (CARDIA)    Difficulty of Paying Living Expenses: Not hard at all  Food Insecurity: No Food Insecurity (01/29/2022)   Hunger Vital Sign    Worried About Running Out of Food in the Last Year: Never true    [place] in the Last Year: Never true  Transportation Needs:  No Transportation Needs (01/29/2022)   PRAPARE - Hydrologist (Medical): No    Lack of Transportation (Non-Medical): No  Physical Activity: Sufficiently Active (01/29/2022)   Exercise Vital Sign    Days of Exercise per Week: 5 days    Minutes of Exercise per Session: 30 min  Stress: No Stress Concern Present (01/29/2022)   [place]    Feeling of Stress : Not at all  Social Connections: [place] (01/29/2022)  Social Licensed conveyancer [NHANES]    Frequency of Communication with Friends and Family: More than three times a week    Frequency of Social Gatherings with Friends and Family: More than three times a week    Attends Religious Services: More than 4 times per year    Active Member of [employer] or Organizations: Yes    Attends Music therapist: More than 4 times per year    Marital Status: Married     Family History: The patient's family history includes Diabetes in his brother, father, and sister; Healthy in his daughter and son; High blood pressure in his father; Other in his mother; Stroke in his father.  ROS:   Please see the history of present illness.    All other systems reviewed and are negative.  EKGs/Labs/Other Studies Reviewed:    The following studies were reviewed today: 2D echo images are personally reviewed.  LV systolic function is normal.  There is biatrial enlargement.  There is normal function of the patient's TAVR bioprosthesis with a mean gradient ranging from 9 to 12 mmHg and no significant paravalvular regurgitation.  The patient has evidence of pulmonary hypertension, dilated IVC, and at least moderate tricuspid and mitral regurgitation.  Formal interpretation is currently pending.  Above interpretation is based on my personal review of his images.  EKG:  EKG is ordered today.  The ekg ordered today demonstrates atrial fibrillation 71  bpm, nonspecific ST and T wave abnormality  Recent Labs: 11/08/2021: B Natriuretic Peptide >4,500.0 08/18/2022: ALT 12; BUN 31; Creatinine 1.38; Hemoglobin 11.3; Platelets 143; Potassium 4.2; Sodium 142  Recent Lipid Panel    Component Value Date/Time   CHOL 149 12/09/2017 1457   TRIG 57.0 12/09/2017 1457   HDL 72.30 12/09/2017 1457   CHOLHDL 2 12/09/2017 1457   VLDL 11.4 12/09/2017 1457   LDLCALC 65 12/09/2017 1457     Risk Assessment/Calculations:    CHA2DS2-VASc Score = 5   This indicates a 7.2% annual risk of stroke. The patient's score is based upon: CHF History: 1 HTN History: 1 Diabetes History: 0 Stroke History: 0 Vascular Disease History: 1 Age Score: 2 Gender Score: 0               Physical Exam:    VS:  BP 104/60   Pulse 71   Ht '5\' 4"'$  (1.626 m)   Wt 146 lb 12.8 oz (66.6 kg)   SpO2 95%   BMI 25.20 kg/m     Wt Readings from Last 3 Encounters:  10/28/22 146 lb 12.8 oz (66.6 kg)  08/18/22 139 lb 3.2 oz (63.1 kg)  08/01/22 141 lb (64 kg)     GEN: Pleasant elderly male in no acute distress HEENT: Normal NECK: JVP elevated; No carotid bruits LYMPHATICS: No lymphadenopathy CARDIAC: Irregularly irregular, no murmurs, rubs, gallops RESPIRATORY:  Clear to auscultation without rales, wheezing or rhonchi  ABDOMEN: Soft, non-tender, non-distended MUSCULOSKELETAL: 1+ bilateral pretibial edema; No deformity  SKIN: Warm and dry NEUROLOGIC:  Alert and oriented x 3 PSYCHIATRIC:  Normal affect   ASSESSMENT:    1. Chronic diastolic CHF (congestive heart failure) ([place])   2. Medication management   3. Bilateral lower extremity edema   4. S/P TAVR (transcatheter aortic valve replacement)   5. Coronary artery disease involving native coronary artery of native heart without angina pectoris   6. New onset atrial fibrillation (HCC)    PLAN:    In order of problems listed above:  The patient appears volume overloaded on exam.  His IVC is dilated on echo and he  has pulmonary hypertension.  Recommend that he start taking furosemide 40 mg daily.  His son emphasized the importance of diuretic use with him today.  He will start taking potassium chloride 10 mill equivalents on days that he takes Lasix.  Will bring him back for follow-up labs and an APP visit in a few months.  Considering his advanced age, I suspect his diastolic heart failure is related to the presence of severe aortic stenosis, senile sigmoid septum, possible amyloid heart disease which we have not aggressively investigated at age 5, and hypertension.  Will continue to treat him medically. As above, emphasized the importance of daily furosemide.  The patient understands and is motivated to start taking his diuretic.  He understands that frequent urination comes with the territory. Secondary to #1 Aortic bioprosthetic valve appears to be functioning normally on my review of his echo today with a mean transvalvular gradient ranging from 9 to 12 mmHg and no significant paravalvular regurgitation No anginal symptoms.  He will discontinue aspirin due to the need to start on oral anticoagulation.  See discussion below. The patient has newly diagnosed atrial fibrillation.  Will see how he responds to IV diuresis before moving to a rhythm control strategy.  I think there is a very high likelihood he would fail cardioversion in the setting of CHF, significant biatrial enlargement, and underlying cardiac disease as outlined above.  Will start apixaban for anticoagulation.  I discussed the rationale for this with him today.  He understands and will discontinue aspirin and start on apixaban 5 mg twice daily (while his age is greater than 66, renal function and weight do not support dose reduction).  We briefly discussed antiarrhythmic drug therapy, but we will reserve this for refractory symptoms.           Medication Adjustments/Labs and Tests Ordered: Current medicines are reviewed at length with the  patient today.  Concerns regarding medicines are outlined above.  Orders Placed This Encounter  Procedures   Basic metabolic panel   EKG [id number]   Meds ordered this encounter  Medications   furosemide (LASIX) 40 MG tablet    Sig: Take 1 tablet (40 mg total) by mouth daily.    Dispense:  90 tablet    Refill:  3    Dose increase   potassium chloride (KLOR-CON M) 10 MEQ tablet    Sig: Take 1 tablet by mouth daily (with Furosemide)    Dispense:  90 tablet    Refill:  3   apixaban (ELIQUIS) 5 MG TABS tablet    Sig: Take 1 tablet (5 mg total) by mouth 2 (two) times daily.    Dispense:  180 tablet    Refill:  3    Patient Instructions  Medication Instructions:  RESUME Furosemide '40mg'$  daily RESUME Potassium 53mq daily STOP Aspirin START Eliquis '5mg'$  twice daily *If you need a refill on your cardiac medications before your next appointment, please call your pharmacy*   Lab Work: BMET in 2-3 months If you have labs (blood work) drawn today and your tests are completely normal, you will receive your results only by: [place](if you have MyChart) OR A paper copy in the mail If you have any lab test that is abnormal or we need to change your treatment, we will call you to review the results.   Testing/Procedures: NONE   Follow-Up: [facility], you and your health needs are our priority.  As part of our continuing mission to provide you with exceptional heart care, we have created designated Provider Care Teams.  These Care Teams include your primary Cardiologist (physician) and Advanced Practice Providers (APPs -  Physician Assistants and Nurse Practitioners) who all work together to provide you with the care you need, when you need it.  We recommend signing up for the patient portal called "MyChart".  Sign up information is provided on this After Visit Summary.  MyChart is used to connect with patients for Virtual Visits (Telemedicine).  Patients are able to  view lab/test results, encounter notes, upcoming appointments, etc.  Non-urgent messages can be sent to your provider as well.   To learn more about what you can do with MyChart, go to [email or web address].    Your next appointment:   2-3 months   Provider:   Sherren Mocha, MD        Signed, Sherren Mocha, MD  10/28/2022 1:07 PM    [place]

## 2022-10-28 NOTE — Patient Instructions (Addendum)
Medication Instructions:  RESUME Furosemide '40mg'$  daily RESUME Potassium 27mq daily STOP Aspirin START Eliquis '5mg'$  twice daily *If you need a refill on your cardiac medications before your next appointment, please call your pharmacy*   Lab Work: BMET in 2-3 months If you have labs (blood work) drawn today and your tests are completely normal, you will receive your results only by: [place](if you have MyChart) OR A paper copy in the mail If you have any lab test that is abnormal or we need to change your treatment, we will call you to review the results.   Testing/Procedures: NONE   Follow-Up: [facility] and your [facility] needs are our priority.  As part of our continuing mission to provide you with exceptional heart care, we have created designated Provider Care Teams.  These Care Teams include your primary Cardiologist (physician) and Advanced Practice Providers (APPs -  Physician Assistants and Nurse Practitioners) who all work together to provide you with the care you need, when you need it.  We recommend signing up for the patient portal called "MyChart".  Sign up information is provided on this After Visit Summary.  MyChart is used to connect with patients for Virtual Visits (Telemedicine).  Patients are able to view lab/test results, encounter notes, upcoming appointments, etc.  Non-urgent messages can be sent to your provider as well.   To learn more about what you can do with MyChart, go to [email or web address]    Your next appointment:   2-3 months   Provider:   MSherren Mocha MD

## 2022-10-30 ENCOUNTER — Other Ambulatory Visit: Payer: Self-pay | Admitting: Internal Medicine

## 2022-11-04 ENCOUNTER — Telehealth: Payer: Self-pay | Admitting: Hematology

## 2022-11-04 NOTE — Telephone Encounter (Signed)
Called patient per provider PAL to r/s appointment. Patient r/s and notified.

## 2022-11-09 ENCOUNTER — Encounter: Payer: Self-pay | Admitting: Internal Medicine

## 2022-11-09 NOTE — Progress Notes (Unsigned)
    Subjective:    Patient ID: Paul Mays, male    DOB: Mar 03, 1928, 89 y.o.   MRN: [id number]      HPI Paul Mays is here for No chief complaint on file.  Newly dx afib with CHF.  Swelling in both ankles -  he saw cardio 1/23  and at that time had DOE, leg edema - lasix 40 mg resumed with potassium.      Medications and allergies reviewed with patient and updated if appropriate.  Current Outpatient Medications on File Prior to Visit  Medication Sig Dispense Refill   allopurinol (ZYLOPRIM) 300 MG tablet TAKE 1 TABLET(300 MG) BY MOUTH DAILY AS NEEDED FOR GOUT FLARE 90 tablet 0   amLODipine (NORVASC) 5 MG tablet Take 1 tablet (5 mg total) by mouth daily. 180 tablet 3   amoxicillin (AMOXIL) 500 MG tablet Take 4 tablets (2,000 mg total) by mouth as directed. 1 HOUR PRIOR TO DENTAL APPOINTMENTS 12 tablet 6   apixaban (ELIQUIS) 5 MG TABS tablet Take 1 tablet (5 mg total) by mouth 2 (two) times daily. 180 tablet 3   Cholecalciferol (VITAMIN D3) 50 MCG (2000 UT) capsule Take 2,000 Units by mouth daily.     furosemide (LASIX) 40 MG tablet Take 1 tablet (40 mg total) by mouth daily. 90 tablet 3   gabapentin (NEURONTIN) 600 MG tablet Take 1 tablet (600 mg total) by mouth at bedtime. 90 tablet 1   hydrALAZINE (APRESOLINE) 25 MG tablet TAKE 1 TABLET(25 MG) BY MOUTH IN THE MORNING AND AT BEDTIME 60 tablet 10   iron polysaccharides (NIFEREX) 150 MG capsule Take 1 capsule (150 mg total) by mouth daily. 30 capsule 5   metoprolol tartrate (LOPRESSOR) 25 MG tablet Take 1 tablet (25 mg total) by mouth 2 (two) times daily. 180 tablet 3   potassium chloride (KLOR-CON M) 10 MEQ tablet Take 1 tablet by mouth daily (with Furosemide) 90 tablet 3   pravastatin (PRAVACHOL) 40 MG tablet Take 1 tablet (40 mg total) by mouth daily. 90 tablet 3   vitamin B-12 (CYANOCOBALAMIN) 500 MCG tablet Take 500 mcg by mouth daily.     Current Facility-Administered Medications on File Prior to Visit  Medication Dose Route  Frequency Provider Last Rate Last Admin   methylPREDNISolone acetate (DEPO-MEDROL) injection 80 mg  80 mg Intramuscular Once Lanae Crumbly, PA-C        Review of Systems     Objective:  There were no vitals filed for this visit. BP Readings from Last 3 Encounters:  10/28/22 104/60  08/18/22 114/65  08/01/22 128/81   Wt Readings from Last 3 Encounters:  10/28/22 146 lb 12.8 oz (66.6 kg)  08/18/22 139 lb 3.2 oz (63.1 kg)  08/01/22 141 lb (64 kg)   There is no height or weight on file to calculate BMI.    Physical Exam         Assessment & Plan:    See Problem List for Assessment and Plan of chronic medical problems.

## 2022-11-10 ENCOUNTER — Ambulatory Visit (INDEPENDENT_AMBULATORY_CARE_PROVIDER_SITE_OTHER): Payer: Medicare Other | Admitting: Internal Medicine

## 2022-11-10 ENCOUNTER — Encounter: Payer: Self-pay | Admitting: Internal Medicine

## 2022-11-10 VITALS — BP 118/60 | HR 54 | Temp 98.4°F | Ht 64.0 in | Wt 141.0 lb

## 2022-11-10 DIAGNOSIS — M1990 Unspecified osteoarthritis, unspecified site: Secondary | ICD-10-CM | POA: Insufficient documentation

## 2022-11-10 DIAGNOSIS — M109 Gout, unspecified: Secondary | ICD-10-CM

## 2022-11-10 DIAGNOSIS — I2510 Atherosclerotic heart disease of native coronary artery without angina pectoris: Secondary | ICD-10-CM | POA: Diagnosis not present

## 2022-11-10 DIAGNOSIS — E7849 Other hyperlipidemia: Secondary | ICD-10-CM

## 2022-11-10 DIAGNOSIS — N183 Chronic kidney disease, stage 3 unspecified: Secondary | ICD-10-CM | POA: Insufficient documentation

## 2022-11-10 DIAGNOSIS — K5190 Ulcerative colitis, unspecified, without complications: Secondary | ICD-10-CM | POA: Insufficient documentation

## 2022-11-10 DIAGNOSIS — C61 Malignant neoplasm of prostate: Secondary | ICD-10-CM | POA: Insufficient documentation

## 2022-11-10 DIAGNOSIS — R519 Headache, unspecified: Secondary | ICD-10-CM | POA: Insufficient documentation

## 2022-11-10 DIAGNOSIS — R600 Localized edema: Secondary | ICD-10-CM | POA: Insufficient documentation

## 2022-11-10 DIAGNOSIS — Z029 Encounter for administrative examinations, unspecified: Secondary | ICD-10-CM | POA: Insufficient documentation

## 2022-11-10 DIAGNOSIS — G4733 Obstructive sleep apnea (adult) (pediatric): Secondary | ICD-10-CM | POA: Insufficient documentation

## 2022-11-10 DIAGNOSIS — B029 Zoster without complications: Secondary | ICD-10-CM | POA: Insufficient documentation

## 2022-11-10 DIAGNOSIS — Z789 Other specified health status: Secondary | ICD-10-CM | POA: Insufficient documentation

## 2022-11-10 DIAGNOSIS — R609 Edema, unspecified: Secondary | ICD-10-CM | POA: Insufficient documentation

## 2022-11-10 DIAGNOSIS — G629 Polyneuropathy, unspecified: Secondary | ICD-10-CM | POA: Insufficient documentation

## 2022-11-10 NOTE — Assessment & Plan Note (Signed)
Chronic Denies gout flares Continue allopurinol 300 mg daily

## 2022-11-10 NOTE — Patient Instructions (Addendum)
     Elevate your legs more when sitting during the day    Medications changes include :   none    Return if symptoms worsen or fail to improve.

## 2022-11-10 NOTE — Assessment & Plan Note (Signed)
Chronic Has bilateral leg edema with 2+ pitting edema halfway up the leg-this is not causing any discomfort and does not bother him he was just not sure if he needed to do something about it Saw cardiology a couple of weeks ago and Lasix 40 mg daily with potassium was resumed-there was some improvement in the leg swelling, but it persists Shortness of breath did improve with the Lasix He is elevating his legs some during the day, but not consistently-stressed consistent elevation every time he sits Does not tolerate the compression socks No shortness of breath today-lungs are clear Discussed that no medication adjustment is needed at this time-not worry about dehydrating him with any increase in furosemide Has follow-up with cardiology scheduled Continue Lasix 40 mg daily, potassium daily Expect edema to improve slightly with consistent elevation

## 2022-11-10 NOTE — Assessment & Plan Note (Signed)
Chronic Continue pravastatin 40 mg daily

## 2022-11-27 NOTE — Progress Notes (Unsigned)
Subjective:    Patient ID: Paul Mays, male    DOB: 07-19-1928, 89 y.o.   MRN: [id number]      HPI Quantae is here for  Chief Complaint  Patient presents with   Edema    Both ankles still swollen      Edema in feet/ankles - swelling is not better.  He is taking the lasix daily every morning.  He does elevate his legs when sitting.  He denies SOB, CP, palpitations.     He is having fluid leak from his left leg and his shoe is all wet.  He wakes up with his bed soaked.  He denies leg pain.   He has a couple of areas on his left arm that keep bleeding - has several bandaides on and it bleed through.  No bleeding from his legs    Medications and allergies reviewed with patient and updated if appropriate.  Current Outpatient Medications on File Prior to Visit  Medication Sig Dispense Refill   allopurinol (ZYLOPRIM) 300 MG tablet TAKE 1 TABLET(300 MG) BY MOUTH DAILY AS NEEDED FOR GOUT FLARE 90 tablet 0   amoxicillin (AMOXIL) 500 MG tablet Take 4 tablets (2,000 mg total) by mouth as directed. 1 HOUR PRIOR TO DENTAL APPOINTMENTS 12 tablet 6   apixaban (ELIQUIS) 5 MG TABS tablet Take 1 tablet (5 mg total) by mouth 2 (two) times daily. 180 tablet 3   Cholecalciferol (VITAMIN D3) 50 MCG (2000 UT) capsule Take 2,000 Units by mouth daily.     furosemide (LASIX) 40 MG tablet Take 1 tablet (40 mg total) by mouth daily. 90 tablet 3   gabapentin (NEURONTIN) 600 MG tablet Take 1 tablet (600 mg total) by mouth at bedtime. 90 tablet 1   hydrALAZINE (APRESOLINE) 25 MG tablet TAKE 1 TABLET(25 MG) BY MOUTH IN THE MORNING AND AT BEDTIME 60 tablet 10   iron polysaccharides (NIFEREX) 150 MG capsule Take 1 capsule (150 mg total) by mouth daily. 30 capsule 5   metoprolol tartrate (LOPRESSOR) 25 MG tablet Take 1 tablet (25 mg total) by mouth 2 (two) times daily. 180 tablet 3   potassium chloride (KLOR-CON M) 10 MEQ tablet Take 1 tablet by mouth daily (with Furosemide) 90 tablet 3   pravastatin  (PRAVACHOL) 40 MG tablet Take 1 tablet (40 mg total) by mouth daily. 90 tablet 3   vitamin B-12 (CYANOCOBALAMIN) 500 MCG tablet Take 500 mcg by mouth daily.     Current Facility-Administered Medications on File Prior to Visit  Medication Dose Route Frequency Provider Last Rate Last Admin   methylPREDNISolone acetate (DEPO-MEDROL) injection 80 mg  80 mg Intramuscular Once Lanae Crumbly, PA-C        Review of Systems  Constitutional:  Negative for fever.  Respiratory:  Negative for cough, shortness of breath and wheezing.   Cardiovascular:  Positive for leg swelling. Negative for chest pain and palpitations.  Hematological:  Bruises/bleeds easily.       Objective:   Vitals:   11/28/22 1622  BP: 122/76  Pulse: 60  Temp: 97.7 F (36.5 C)  SpO2: 98%   BP Readings from Last 3 Encounters:  11/28/22 122/76  11/10/22 118/60  10/28/22 104/60   Wt Readings from Last 3 Encounters:  11/28/22 143 lb (64.9 kg)  11/10/22 141 lb (64 kg)  10/28/22 146 lb 12.8 oz (66.6 kg)   Body mass index is 24.55 kg/m.    Physical Exam Constitutional:  General: He is not in acute distress.    Appearance: Normal appearance. He is not ill-appearing.  HENT:     Head: Normocephalic and atraumatic.  Cardiovascular:     Rate and Rhythm: Normal rate and regular rhythm.     Heart sounds: Murmur heard.  Pulmonary:     Effort: Pulmonary effort is normal. No respiratory distress.     Breath sounds: Normal breath sounds. No wheezing or rales.  Musculoskeletal:     Right lower leg: Edema (2+) present.     Left lower leg: Edema (2+ with two small breaks in skin on medial aspect of lower leg leaking clear fluid) present.  Skin:    General: Skin is warm and dry.     Findings: Bruising (arms - senile purpura) present. No erythema or rash.     Comments: Two small skin tears on posterior distal left forearm - oozing blood  Neurological:     Mental Status: He is alert. Mental status is at baseline.             Assessment & Plan:    See Problem List for Assessment and Plan of chronic medical problems.

## 2022-11-28 ENCOUNTER — Ambulatory Visit (INDEPENDENT_AMBULATORY_CARE_PROVIDER_SITE_OTHER): Payer: Medicare Other | Admitting: Internal Medicine

## 2022-11-28 ENCOUNTER — Encounter: Payer: Self-pay | Admitting: Internal Medicine

## 2022-11-28 ENCOUNTER — Telehealth: Payer: Self-pay | Admitting: Cardiovascular Disease

## 2022-11-28 VITALS — BP 122/76 | HR 60 | Temp 97.7°F | Ht 64.0 in | Wt 143.0 lb

## 2022-11-28 DIAGNOSIS — T148XXA Other injury of unspecified body region, initial encounter: Secondary | ICD-10-CM

## 2022-11-28 DIAGNOSIS — R600 Localized edema: Secondary | ICD-10-CM | POA: Diagnosis not present

## 2022-11-28 DIAGNOSIS — I2510 Atherosclerotic heart disease of native coronary artery without angina pectoris: Secondary | ICD-10-CM | POA: Diagnosis not present

## 2022-11-28 NOTE — Telephone Encounter (Signed)
Returned call to patient.  Patient states he is not currently bleeding from arms/legs. He states he has bruising on arms/legs from Eliquis and during the night different areas open up and bleed.   Patient would like to know if he should continue taking his Eliquis as prescribed or if there is an alternative or reduced dose? Any hold instructions?  Instructed patient to continue taking Eliquis as prescribed until instructed to do otherwise by our office.  Advised on holding pressure for at least 5-10 minutes to any areas of active bleeding should it occur, also advised on ED precautions. Also instructed patient to be cautious with sharp objects, pointed corners, and bumping against walls/items, and to wear long sleeves/pants to protect skin. Patient verbalized understanding.    Will forward to Dr. Burt Knack to review and advise.

## 2022-11-28 NOTE — Patient Instructions (Addendum)
    Try putting on light compression socks after the swelling decreases a little.      Medications changes include :   increase to furosemide 40 mg to twice daily for 3 days then return to once a day in morning.       Return for follow up monday.

## 2022-11-28 NOTE — Telephone Encounter (Signed)
Returned call to patient and spoke to his caretaker (she wouldn't give name diretly). She states he is on the way to a PCP appointment now with Dr Quay Burow who will discuss/address some of this. She states he is bleeding easier and describes nicks/abrasions to forearm area and on lower legs. Advised that he will bleed much easier and to apply compression to area for up to 5 minutes. There's no alternative treatment and doesn't merit holding the drug itself, but should apply compression when incidents occur. No further questions.

## 2022-11-28 NOTE — Telephone Encounter (Signed)
Pt c/o medication issue:  1. Name of Medication: apixaban (ELIQUIS) 5 MG TABS tablet   2. How are you currently taking this medication (dosage and times per day)?   3. Are you having a reaction (difficulty breathing--STAT)?   4. What is your medication issue? Pt states his arm and the bottom part of his legs is bleeding. He states its been going on for a while. He states he has to keep putting band aids on. Please advise.

## 2022-11-29 DIAGNOSIS — T148XXA Other injury of unspecified body region, initial encounter: Secondary | ICD-10-CM | POA: Insufficient documentation

## 2022-11-29 NOTE — Assessment & Plan Note (Signed)
New 2 small skin tears on posterior left distal arm oozing blood - he has had a hard time stopping the blood due to his eliquis Skin tears are small - no evidence of infection Antibacterial ointment placed, wrapped with non-stick pad and then guaze with coban  Will follow up in 3 days and I will check, re-wrap

## 2022-11-29 NOTE — Assessment & Plan Note (Signed)
Chronic Taking lasix 40 mg daily Still with significant swelling - now left leg has two small areas that are leaking clear fluid No evidence of infection Stressed elevating legs when sitting and walking around more during the day Will increase lasix to 40 mg bid x 3 days Will f/u in 3 days - will check bmp and swelling Advised to get mild compression socks and if swelling decreases a little should be able to start using daily - has HHA that can help put them on Left leg wrapped with non-stick bandage with antibacterial ointment, gauze and then coban

## 2022-11-30 ENCOUNTER — Encounter: Payer: Self-pay | Admitting: Internal Medicine

## 2022-11-30 NOTE — Patient Instructions (Signed)
      Blood work was ordered.   The lab is on the first floor.    Medications changes include :   none      Return in about 6 months (around 06/01/2023) for follow up, sooner if needed.

## 2022-11-30 NOTE — Progress Notes (Unsigned)
Subjective:    Patient ID: Paul Mays, male    DOB: 12-05-1927, 89 y.o.   MRN: [id number]     HPI Paul Mays is here for follow up of his leg swelling, skin tears    Took 3 days of 40 mg lasix bid for his leg swelling-today is the last day and he will take the second Lasix when he gets home.  He did notice increased urination.  There was improvement in his leg swelling.  His left leg still has a couple of areas that it is leaking from.  Skin tears on left forearm -improved.  It did not bleed after he left here.  The bandage was removed today.  He denies any pain.    Medications and allergies reviewed with patient and updated if appropriate.  Current Outpatient Medications on File Prior to Visit  Medication Sig Dispense Refill   allopurinol (ZYLOPRIM) 300 MG tablet TAKE 1 TABLET(300 MG) BY MOUTH DAILY AS NEEDED FOR GOUT FLARE 90 tablet 0   amoxicillin (AMOXIL) 500 MG tablet Take 4 tablets (2,000 mg total) by mouth as directed. 1 HOUR PRIOR TO DENTAL APPOINTMENTS 12 tablet 6   apixaban (ELIQUIS) 5 MG TABS tablet Take 1 tablet (5 mg total) by mouth 2 (two) times daily. 180 tablet 3   Cholecalciferol (VITAMIN D3) 50 MCG (2000 UT) capsule Take 2,000 Units by mouth daily.     furosemide (LASIX) 40 MG tablet Take 1 tablet (40 mg total) by mouth daily. 90 tablet 3   gabapentin (NEURONTIN) 600 MG tablet Take 1 tablet (600 mg total) by mouth at bedtime. 90 tablet 1   hydrALAZINE (APRESOLINE) 25 MG tablet TAKE 1 TABLET(25 MG) BY MOUTH IN THE MORNING AND AT BEDTIME 60 tablet 10   iron polysaccharides (NIFEREX) 150 MG capsule Take 1 capsule (150 mg total) by mouth daily. 30 capsule 5   metoprolol tartrate (LOPRESSOR) 25 MG tablet Take 1 tablet (25 mg total) by mouth 2 (two) times daily. 180 tablet 3   potassium chloride (KLOR-CON M) 10 MEQ tablet Take 1 tablet by mouth daily (with Furosemide) 90 tablet 3   pravastatin (PRAVACHOL) 40 MG tablet Take 1 tablet (40 mg total) by mouth daily. 90  tablet 3   vitamin B-12 (CYANOCOBALAMIN) 500 MCG tablet Take 500 mcg by mouth daily.     Current Facility-Administered Medications on File Prior to Visit  Medication Dose Route Frequency Provider Last Rate Last Admin   methylPREDNISolone acetate (DEPO-MEDROL) injection 80 mg  80 mg Intramuscular Once Lanae Crumbly, PA-C         Review of Systems  Constitutional:  Negative for chills and fever.  Respiratory:  Negative for shortness of breath.   Cardiovascular:  Positive for leg swelling. Negative for chest pain and palpitations.  Neurological:  Positive for light-headedness (chronic).       Objective:   Vitals:   12/01/22 1450  BP: 116/80  Pulse: 85  Temp: 98.4 F (36.9 C)  SpO2: 96%   BP Readings from Last 3 Encounters:  12/01/22 116/80  11/28/22 122/76  11/10/22 118/60   Wt Readings from Last 3 Encounters:  12/01/22 141 lb (64 kg)  11/28/22 143 lb (64.9 kg)  11/10/22 141 lb (64 kg)   Body mass index is 24.2 kg/m.    Physical Exam Constitutional:      General: He is not in acute distress.    Appearance: Normal appearance. He is not ill-appearing.  HENT:  Head: Normocephalic and atraumatic.  Musculoskeletal:     Right lower leg: Edema (1+ pitting-slightly improved compared to 3 days ago) present.     Left lower leg: Edema (1+ pitting-improved compared to 3 days ago) present.  Skin:    General: Skin is warm and dry.     Findings: Bruising (Bilateral forearms-senile purpura) present.     Comments: Skin tears on left distal posterior forearm are closed-starting to scab over-no bleeding or discharge and no surrounding swelling.  Slight skin openings on left medial lower leg oozing clear fluid.  1 is losing blood because it is overlying a bruise.  Neurological:     Mental Status: He is alert.        Lab Results  Component Value Date   WBC 5.0 08/18/2022   HGB 11.3 (L) 08/18/2022   HCT 36.9 (L) 08/18/2022   PLT 143 (L) 08/18/2022   GLUCOSE 111 (H)  08/18/2022   CHOL 149 12/09/2017   TRIG 57.0 12/09/2017   HDL 72.30 12/09/2017   LDLCALC 65 12/09/2017   ALT 12 08/18/2022   AST 16 08/18/2022   NA 142 08/18/2022   K 4.2 08/18/2022   CL 108 08/18/2022   CREATININE 1.38 (H) 08/18/2022   BUN 31 (H) 08/18/2022   CO2 27 08/18/2022   TSH 2.34 02/19/2021   PSA 0.00 (L) 02/19/2021   INR 1.2 11/08/2021   HGBA1C 4.9 11/08/2021     Assessment & Plan:    See Problem List for Assessment and Plan of chronic medical problems.

## 2022-12-01 ENCOUNTER — Ambulatory Visit (INDEPENDENT_AMBULATORY_CARE_PROVIDER_SITE_OTHER): Payer: Medicare Other | Admitting: Internal Medicine

## 2022-12-01 VITALS — BP 116/80 | HR 85 | Temp 98.4°F | Ht 64.0 in | Wt 141.0 lb

## 2022-12-01 DIAGNOSIS — I10 Essential (primary) hypertension: Secondary | ICD-10-CM | POA: Diagnosis not present

## 2022-12-01 DIAGNOSIS — S51802A Unspecified open wound of left forearm, initial encounter: Secondary | ICD-10-CM | POA: Diagnosis not present

## 2022-12-01 DIAGNOSIS — T148XXA Other injury of unspecified body region, initial encounter: Secondary | ICD-10-CM

## 2022-12-01 DIAGNOSIS — R600 Localized edema: Secondary | ICD-10-CM | POA: Diagnosis not present

## 2022-12-01 DIAGNOSIS — I2510 Atherosclerotic heart disease of native coronary artery without angina pectoris: Secondary | ICD-10-CM

## 2022-12-01 LAB — BASIC METABOLIC PANEL
BUN: 42 mg/dL — ABNORMAL HIGH (ref 6–23)
CO2: 29 mEq/L (ref 19–32)
Calcium: 9.8 mg/dL (ref 8.4–10.5)
Chloride: 103 mEq/L (ref 96–112)
Creatinine, Ser: 1.78 mg/dL — ABNORMAL HIGH (ref 0.40–1.50)
GFR: 32.58 mL/min — ABNORMAL LOW (ref >60.00)
Glucose, Bld: 123 mg/dL — ABNORMAL HIGH (ref 70–99)
Potassium: 4.5 mEq/L (ref 3.5–5.1)
Sodium: 143 mEq/L (ref 135–145)

## 2022-12-01 NOTE — Assessment & Plan Note (Signed)
Subacute Left distal forearm skin tears improved compared to 3 days ago-starting to scab over.  No longer bleeding, no swelling, discharge to suggest infection Will keep them wrapped until further healed Skin tears in the left lower leg oozing clear liquid or blood (1 areas overlying the bruise which is why it is bleeding) Will continue to keep wrapped, but as long as he has significant swelling he will continue to have this oozing No evidence of infection

## 2022-12-01 NOTE — Assessment & Plan Note (Signed)
Chronic Blood pressure well-controlled Continue hydralazine 25 mg morning and bedtime, metoprolol 25 mg twice daily

## 2022-12-01 NOTE — Assessment & Plan Note (Signed)
Chronic Continue to elevate legs whenever sitting Encouraged him walking around the house throughout the day to help with the swelling Will complete 40 mg twice daily x 3 days today-has 1 dose left this afternoon when he goes home There has been some improvement in his leg swelling He is using compression socks on a daily basis now and that is also helping After today will resume Lasix 40 mg daily Has follow-up with cardiology in April BMP today

## 2022-12-03 ENCOUNTER — Telehealth: Payer: Self-pay | Admitting: Internal Medicine

## 2022-12-03 NOTE — Telephone Encounter (Signed)
Attempted to reach patient today but no answer or answering machine.  Patient previously instructed from Cardiology to take blood thinner as prescribed and should continue medication as prescribed.

## 2022-12-03 NOTE — Telephone Encounter (Signed)
Patient called to verify that he should only take one pill a day for his apixaban (ELIQUIS) 5 MG TABS tablet  He would like a callback at [phone].

## 2022-12-18 ENCOUNTER — Telehealth: Payer: Self-pay | Admitting: Hematology

## 2022-12-18 NOTE — Telephone Encounter (Signed)
Per 3/14 IB reached out to patient, patient aware of date and time of appointment change.

## 2022-12-22 ENCOUNTER — Inpatient Hospital Stay: Payer: Medicare Other

## 2022-12-22 ENCOUNTER — Inpatient Hospital Stay: Payer: Medicare Other | Admitting: Hematology

## 2022-12-27 ENCOUNTER — Encounter (HOSPITAL_BASED_OUTPATIENT_CLINIC_OR_DEPARTMENT_OTHER): Payer: Self-pay

## 2022-12-27 ENCOUNTER — Emergency Department (HOSPITAL_BASED_OUTPATIENT_CLINIC_OR_DEPARTMENT_OTHER): Payer: Medicare Other

## 2022-12-27 ENCOUNTER — Inpatient Hospital Stay (HOSPITAL_BASED_OUTPATIENT_CLINIC_OR_DEPARTMENT_OTHER)
Admission: EM | Admit: 2022-12-27 | Discharge: 2023-01-04 | DRG: 698 | Disposition: A | Discharge status: Home Health | Payer: Medicare Other | Attending: Internal Medicine | Admitting: Internal Medicine

## 2022-12-27 DIAGNOSIS — R310 Gross hematuria: Secondary | ICD-10-CM | POA: Diagnosis not present

## 2022-12-27 DIAGNOSIS — D649 Anemia, unspecified: Secondary | ICD-10-CM

## 2022-12-27 DIAGNOSIS — G629 Polyneuropathy, unspecified: Secondary | ICD-10-CM | POA: Diagnosis present

## 2022-12-27 DIAGNOSIS — N3041 Irradiation cystitis with hematuria: Secondary | ICD-10-CM | POA: Diagnosis not present

## 2022-12-27 DIAGNOSIS — D631 Anemia in chronic kidney disease: Secondary | ICD-10-CM | POA: Diagnosis not present

## 2022-12-27 DIAGNOSIS — E7849 Other hyperlipidemia: Secondary | ICD-10-CM | POA: Diagnosis not present

## 2022-12-27 DIAGNOSIS — D62 Acute posthemorrhagic anemia: Secondary | ICD-10-CM | POA: Diagnosis present

## 2022-12-27 DIAGNOSIS — M109 Gout, unspecified: Secondary | ICD-10-CM | POA: Diagnosis present

## 2022-12-27 DIAGNOSIS — I480 Paroxysmal atrial fibrillation: Secondary | ICD-10-CM | POA: Diagnosis not present

## 2022-12-27 DIAGNOSIS — Z888 Allergy status to other drugs, medicaments and biological substances status: Secondary | ICD-10-CM

## 2022-12-27 DIAGNOSIS — T502X5A Adverse effect of carbonic-anhydrase inhibitors, benzothiadiazides and other diuretics, initial encounter: Secondary | ICD-10-CM | POA: Diagnosis not present

## 2022-12-27 DIAGNOSIS — R600 Localized edema: Secondary | ICD-10-CM

## 2022-12-27 DIAGNOSIS — Z8546 Personal history of malignant neoplasm of prostate: Secondary | ICD-10-CM

## 2022-12-27 DIAGNOSIS — I25810 Atherosclerosis of coronary artery bypass graft(s) without angina pectoris: Secondary | ICD-10-CM | POA: Diagnosis not present

## 2022-12-27 DIAGNOSIS — E785 Hyperlipidemia, unspecified: Secondary | ICD-10-CM | POA: Diagnosis present

## 2022-12-27 DIAGNOSIS — I350 Nonrheumatic aortic (valve) stenosis: Secondary | ICD-10-CM

## 2022-12-27 DIAGNOSIS — I10 Essential (primary) hypertension: Secondary | ICD-10-CM | POA: Diagnosis not present

## 2022-12-27 DIAGNOSIS — I5032 Chronic diastolic (congestive) heart failure: Secondary | ICD-10-CM

## 2022-12-27 DIAGNOSIS — F05 Delirium due to known physiological condition: Secondary | ICD-10-CM | POA: Diagnosis not present

## 2022-12-27 DIAGNOSIS — Z7901 Long term (current) use of anticoagulants: Secondary | ICD-10-CM

## 2022-12-27 DIAGNOSIS — I272 Pulmonary hypertension, unspecified: Secondary | ICD-10-CM | POA: Diagnosis not present

## 2022-12-27 DIAGNOSIS — B952 Enterococcus as the cause of diseases classified elsewhere: Secondary | ICD-10-CM | POA: Diagnosis present

## 2022-12-27 DIAGNOSIS — E876 Hypokalemia: Secondary | ICD-10-CM | POA: Diagnosis not present

## 2022-12-27 DIAGNOSIS — K8020 Calculus of gallbladder without cholecystitis without obstruction: Secondary | ICD-10-CM | POA: Diagnosis not present

## 2022-12-27 DIAGNOSIS — I130 Hypertensive heart and chronic kidney disease with heart failure and stage 1 through stage 4 chronic kidney disease, or unspecified chronic kidney disease: Secondary | ICD-10-CM | POA: Diagnosis not present

## 2022-12-27 DIAGNOSIS — R300 Dysuria: Secondary | ICD-10-CM

## 2022-12-27 DIAGNOSIS — Z66 Do not resuscitate: Secondary | ICD-10-CM | POA: Diagnosis present

## 2022-12-27 DIAGNOSIS — N183 Chronic kidney disease, stage 3 unspecified: Secondary | ICD-10-CM | POA: Diagnosis present

## 2022-12-27 DIAGNOSIS — N433 Hydrocele, unspecified: Secondary | ICD-10-CM | POA: Diagnosis not present

## 2022-12-27 DIAGNOSIS — G4733 Obstructive sleep apnea (adult) (pediatric): Secondary | ICD-10-CM | POA: Diagnosis not present

## 2022-12-27 DIAGNOSIS — N179 Acute kidney failure, unspecified: Secondary | ICD-10-CM | POA: Diagnosis present

## 2022-12-27 DIAGNOSIS — Y842 Radiological procedure and radiotherapy as the cause of abnormal reaction of the patient, or of later complication, without mention of misadventure at the time of the procedure: Secondary | ICD-10-CM | POA: Diagnosis present

## 2022-12-27 DIAGNOSIS — Z823 Family history of stroke: Secondary | ICD-10-CM

## 2022-12-27 DIAGNOSIS — N3001 Acute cystitis with hematuria: Secondary | ICD-10-CM | POA: Diagnosis not present

## 2022-12-27 DIAGNOSIS — I2510 Atherosclerotic heart disease of native coronary artery without angina pectoris: Secondary | ICD-10-CM | POA: Diagnosis present

## 2022-12-27 DIAGNOSIS — Z833 Family history of diabetes mellitus: Secondary | ICD-10-CM

## 2022-12-27 DIAGNOSIS — C61 Malignant neoplasm of prostate: Secondary | ICD-10-CM | POA: Diagnosis present

## 2022-12-27 DIAGNOSIS — Z952 Presence of prosthetic heart valve: Secondary | ICD-10-CM

## 2022-12-27 DIAGNOSIS — R339 Retention of urine, unspecified: Secondary | ICD-10-CM | POA: Diagnosis not present

## 2022-12-27 DIAGNOSIS — I5033 Acute on chronic diastolic (congestive) heart failure: Secondary | ICD-10-CM | POA: Diagnosis present

## 2022-12-27 DIAGNOSIS — Z961 Presence of intraocular lens: Secondary | ICD-10-CM | POA: Diagnosis present

## 2022-12-27 DIAGNOSIS — N472 Paraphimosis: Secondary | ICD-10-CM | POA: Diagnosis present

## 2022-12-27 DIAGNOSIS — J90 Pleural effusion, not elsewhere classified: Secondary | ICD-10-CM | POA: Diagnosis not present

## 2022-12-27 DIAGNOSIS — Z781 Physical restraint status: Secondary | ICD-10-CM

## 2022-12-27 DIAGNOSIS — R0902 Hypoxemia: Secondary | ICD-10-CM

## 2022-12-27 DIAGNOSIS — Z79899 Other long term (current) drug therapy: Secondary | ICD-10-CM

## 2022-12-27 DIAGNOSIS — H9190 Unspecified hearing loss, unspecified ear: Secondary | ICD-10-CM | POA: Diagnosis not present

## 2022-12-27 DIAGNOSIS — W19XXXA Unspecified fall, initial encounter: Secondary | ICD-10-CM | POA: Diagnosis present

## 2022-12-27 DIAGNOSIS — R0602 Shortness of breath: Secondary | ICD-10-CM | POA: Diagnosis not present

## 2022-12-27 DIAGNOSIS — N200 Calculus of kidney: Secondary | ICD-10-CM | POA: Diagnosis not present

## 2022-12-27 LAB — CBC
HCT: 28.6 % — ABNORMAL LOW (ref 39.0–52.0)
Hemoglobin: 8.9 g/dL — ABNORMAL LOW (ref 13.0–17.0)
MCH: 28.5 pg (ref 26.0–34.0)
MCHC: 31.1 g/dL (ref 30.0–36.0)
MCV: 91.7 fL (ref 80.0–100.0)
Platelets: 152 10*3/uL (ref 150–400)
RBC: 3.12 MIL/uL — ABNORMAL LOW (ref 4.22–5.81)
RDW: 14.9 % (ref 11.5–15.5)
WBC: 6.6 10*3/uL (ref 4.0–10.5)
nRBC: 0 % (ref 0.0–0.2)

## 2022-12-27 LAB — URINALYSIS, ROUTINE W REFLEX MICROSCOPIC

## 2022-12-27 LAB — BASIC METABOLIC PANEL
Anion gap: 6 (ref 5–15)
BUN: 46 mg/dL — ABNORMAL HIGH (ref 8–23)
CO2: 28 mmol/L (ref 22–32)
Calcium: 9.6 mg/dL (ref 8.9–10.3)
Chloride: 108 mmol/L (ref 98–111)
Creatinine, Ser: 1.81 mg/dL — ABNORMAL HIGH (ref 0.61–1.24)
GFR, Estimated: 34 mL/min — ABNORMAL LOW (ref >60)
Glucose, Bld: 91 mg/dL (ref 70–99)
Potassium: 4.7 mmol/L (ref 3.5–5.1)
Sodium: 142 mmol/L (ref 135–145)

## 2022-12-27 LAB — HEMOGLOBIN AND HEMATOCRIT, BLOOD
HCT: 27.7 % — ABNORMAL LOW (ref 39.0–52.0)
Hemoglobin: 8.5 g/dL — ABNORMAL LOW (ref 13.0–17.0)

## 2022-12-27 LAB — URINALYSIS, MICROSCOPIC (REFLEX): RBC / HPF: >50 RBC/hpf (ref 0–5)

## 2022-12-27 MED ORDER — ONDANSETRON HCL 4 MG PO TABS: 4.0000 mg | ORAL_TABLET | Freq: Four times a day (QID) | ORAL | Status: DC | PRN

## 2022-12-27 MED ORDER — HYDROCODONE-ACETAMINOPHEN 5-325 MG PO TABS
1.0000 | ORAL_TABLET | Freq: Once | ORAL | Status: AC
  Administered 2022-12-27: 1 via ORAL
  Filled 2022-12-27: qty 1

## 2022-12-27 MED ORDER — ACETAMINOPHEN 325 MG PO TABS
650.0000 mg | ORAL_TABLET | Freq: Four times a day (QID) | ORAL | Status: DC | PRN
  Administered 2023-01-02: 650 mg via ORAL
  Filled 2022-12-27 (×2): qty 2

## 2022-12-27 MED ORDER — ACETAMINOPHEN 650 MG RE SUPP: 650.0000 mg | Freq: Four times a day (QID) | RECTAL | Status: DC | PRN

## 2022-12-27 MED ORDER — ONDANSETRON HCL 4 MG/2ML IJ SOLN: 4.0000 mg | Freq: Four times a day (QID) | INTRAMUSCULAR | Status: DC | PRN

## 2022-12-27 MED ORDER — LIDOCAINE HCL URETHRAL/MUCOSAL 2 % EX GEL
1.0000 | Freq: Once | CUTANEOUS | Status: AC
  Administered 2022-12-27: 1 via TOPICAL

## 2022-12-27 MED ORDER — SENNOSIDES-DOCUSATE SODIUM 8.6-50 MG PO TABS: 1.0000 | ORAL_TABLET | Freq: Every evening | ORAL | Status: DC | PRN

## 2022-12-27 MED ORDER — SODIUM CHLORIDE 0.9 % IV SOLN
1.0000 g | INTRAVENOUS | Status: DC
  Administered 2022-12-28: 1 g via INTRAVENOUS
  Filled 2022-12-27: qty 10

## 2022-12-27 MED ORDER — SODIUM CHLORIDE 0.9 % IV SOLN
1.0000 g | Freq: Once | INTRAVENOUS | Status: AC
  Administered 2022-12-27: 1 g via INTRAVENOUS
  Filled 2022-12-27: qty 10

## 2022-12-27 NOTE — ED Notes (Signed)
Report given to IP RN receiving Pt. 

## 2022-12-27 NOTE — ED Provider Notes (Signed)
[facility] Provider Note   CSN: [id number] Arrival date & time: 12/27/22  1250     History {Add pertinent medical, surgical, social history, OB history to HPI:1} Chief Complaint  Patient presents with   Hematuria    Paul Mays is a 89 y.o. male.   Hematuria   89 year old male presents emergency department with complaints of blood in urine.  Patient states that he has had burning/stinging when urinating for the past week.  This morning, he noticed blood in his urine.  Describes as peeing blood.  Reports some lower abdominal discomfort.  States that if he has felt the urge to pee but has been unable to.  Last time he urinated was this morning.  Denies fever, chills, nausea, vomiting, chest pain, shortness of breath, change in bowel habits.  Denies history of similar symptoms in the past.  Past medical history significant for pulmonary hypertension, atrial fibrillation on Eliquis, history of prostate cancer, OSA, hypertension, hyperlipidemia, migraine, CAD, carotid artery stenosis, aortic stenosis, transcatheter aortic valve replacement, CKD stage III, CAD, gout  Home Medications Prior to Admission medications   Medication Sig Start Date End Date Taking? Authorizing Provider  allopurinol (ZYLOPRIM) 300 MG tablet TAKE 1 TABLET(300 MG) BY MOUTH DAILY AS NEEDED FOR GOUT FLARE 10/30/22   Burns, Claudina Lick, MD  amoxicillin (AMOXIL) 500 MG tablet Take 4 tablets (2,000 mg total) by mouth as directed. 1 HOUR PRIOR TO DENTAL APPOINTMENTS 11/20/21   Eileen Stanford, PA-C  apixaban (ELIQUIS) 5 MG TABS tablet Take 1 tablet (5 mg total) by mouth 2 (two) times daily. 10/28/22   Sherren Mocha, MD  Cholecalciferol (VITAMIN D3) 50 MCG (2000 UT) capsule Take 2,000 Units by mouth daily.    [provider]  furosemide (LASIX) 40 MG tablet Take 1 tablet (40 mg total) by mouth daily. 10/28/22   Sherren Mocha, MD  gabapentin (NEURONTIN) 600 MG tablet  Take 1 tablet (600 mg total) by mouth at bedtime. 09/30/22   Janith Lima, MD  hydrALAZINE (APRESOLINE) 25 MG tablet TAKE 1 TABLET(25 MG) BY MOUTH IN THE MORNING AND AT BEDTIME 01/21/22   Sherren Mocha, MD  iron polysaccharides (NIFEREX) 150 MG capsule Take 1 capsule (150 mg total) by mouth daily. 09/01/22   Brunetta Genera, MD  metoprolol tartrate (LOPRESSOR) 25 MG tablet Take 1 tablet (25 mg total) by mouth 2 (two) times daily. 03/08/21   Binnie Rail, MD  potassium chloride (KLOR-CON M) 10 MEQ tablet Take 1 tablet by mouth daily (with Furosemide) 10/28/22   Sherren Mocha, MD  pravastatin (PRAVACHOL) 40 MG tablet Take 1 tablet (40 mg total) by mouth daily. 07/07/22   Binnie Rail, MD  vitamin B-12 (CYANOCOBALAMIN) 500 MCG tablet Take 500 mcg by mouth daily.    [provider]      Allergies    Lisinopril and Spironolactone    Review of Systems   Review of Systems  Genitourinary:  Positive for hematuria.  All other systems reviewed and are negative.   Physical Exam Updated Vital Signs BP (!) 101/52 (BP Location: Left Arm)   Pulse (!) 58   Temp 97.7 F (36.5 C)   Resp 18   SpO2 95%  Physical Exam Vitals and nursing note reviewed.  Constitutional:      General: He is not in acute distress.    Appearance: He is well-developed.  HENT:     Head: Normocephalic and atraumatic.  Eyes:  Conjunctiva/sclera: Conjunctivae normal.  Cardiovascular:     Rate and Rhythm: Normal rate. Rhythm irregular.  Pulmonary:     Effort: Pulmonary effort is normal. No respiratory distress.     Breath sounds: Normal breath sounds. No wheezing, rhonchi or rales.  Abdominal:     Palpations: Abdomen is soft.     Tenderness: There is abdominal tenderness. There is no right CVA tenderness or left CVA tenderness.     Comments: Suprapubic tenderness to palpation.  Musculoskeletal:     Cervical back: Neck supple.  Skin:    General: Skin is warm and dry.     Capillary Refill:  Capillary refill takes less than 2 seconds.  Neurological:     Mental Status: He is alert.  Psychiatric:        Mood and Affect: Mood normal.     ED Results / Procedures / Treatments   Labs (all labs ordered are listed, but only abnormal results are displayed) Labs Reviewed  URINALYSIS, [place] PANEL  CBC    EKG None  Radiology No results found.  Procedures Procedures  {Document cardiac monitor, telemetry assessment procedure when appropriate:1}  Medications Ordered in ED Medications - No data to display  ED Course/ Medical Decision Making/ A&P Clinical Course as of 12/27/22 1918  Sat Dec 27, 2022  1602 Patient now receiving bladder irrigation.  To reassess afterward. [CR]  [id number] Patient irrigated by nursing staff with no resolution/improvement of gross hematuria.   [CR]  [id number] Upon further HPI, patient expressed feelings of lightheadedness, shortness of breath with ambulation over the past 2 weeks.  Given patient's drop in hemoglobin 2 g in the past 4 months, Eliquis, continued gross hematuria after irrigation, will pursue admission.  Patient and family on board with plan. [CR]  52 Consulted urology Dr. Abner Greenspan regarding the patient.  Agreed with admission to [facility] through hospital medicine. [CR]    Clinical Course User Index [CR] Wilnette Kales, PA   {   Click here for ABCD2, HEART and other calculatorsREFRESH Note before signing :1}                          Medical Decision Making Amount and/or Complexity of Data Reviewed Labs: ordered. Radiology: ordered.  Risk Prescription drug management. Decision regarding hospitalization.   This patient presents to the ED for concern of hematuria/urinary retention, this involves an extensive number of treatment options, and is a complaint that carries with it a high risk of complications and morbidity.  The differential diagnosis includes malignancy, cystitis, neurogenic bladder,  cauda equina   Co morbidities that complicate the patient evaluation  See HPI   Additional history obtained:  Additional history obtained from EMR External records from outside source obtained and reviewed including hospital records   Lab Tests:  I Ordered, and personally interpreted labs.  The pertinent results include: Patient with worsening of anemia with a hemoglobin of 8.9 which is decreased from prior study a few months ago of 11.2.  No leukocytosis noted.  Platelets within normal limits.  No electrolyte abnormalities appreciated.  Patient with evidence of slightly worsening creatinine of 1.81 with GFR of 34.  UA difficult to assess given amount of hematuria but with few bacteria, 11-20 WBCs; concern for possible urinary tract infection given patient's symptoms of dysuria a week prior to presentation.   Imaging Studies ordered:  I ordered imaging studies including CT renal stone study I  independently visualized and interpreted imaging which showed 5 send her lobulated density within the bladder.  Tiny nonobstructing left renal calculus.  Cholelithiasis.  Large left scrotal hydrocele.  Stable small bilateral pleural effusion.  Aortic atherosclerosis. I agree with the radiologist interpretation  Cardiac Monitoring: / EKG:  The patient was maintained on a cardiac monitor.  I personally viewed and interpreted the cardiac monitored which showed an underlying rhythm of: Atrial flutter 41 with AV block.  Left posterior fascicular block.  Normal rate.   Consultations Obtained:  I requested consultation with attending physician Dr. Alvino Chapel who is in agreement with treatment plan going forward.  See ED course for additional consultations.   Problem List / ED Course / Critical interventions / Medication management  Gross hematuria, anemia, dysuria I ordered medication including Norco, Rocephin, lidocaine   Reevaluation of the patient after these medicines showed that the patient  improved I have reviewed the patients home medicines and have made adjustments as needed   Social Determinants of Health:  Denies tobacco, illicit drug use.   Test / Admission - Considered:  Gross hematuria, anemia, dysuria Vitals signs within normal range and stable throughout visit. Laboratory/imaging studies significant for: See above 89 year old male presents emergency department with urinary retention, hematuria, dysuria.  Patient was found to have hemoglobin dropped 2 g from prior study approximately 4 months ago from where his baseline typically is.  Upon further review after hemoglobin resulted, patient complaining of worsening feelings of shortness of breath, lightheadedness over the past 1 to 2 weeks.  Patient's UA difficult to assess evidence of infection given amount of gross red blood; given patient's complaints of dysuria for the past week, will treat empirically with Rocephin.  Patient's bladder was irrigated with 800 cc of sterile normal saline by nursing staff with no resolution of gross hematuria.  Given patient's symptomatic anemia, continued gross hematuria, blood thinner use, will pursue admission.  Proper consultations were made in the emergency department as depicted in ED course.  Treatment plan discussed at length with patient and family and they acknowledge understanding were agreeable to said plan.  Patient stable upon admission to the hospital.   {Document critical care time when appropriate:1} {Document review of labs and clinical decision tools ie heart score, Chads2Vasc2 etc:1}  {Document your independent review of radiology images, and any outside records:1} {Document your discussion with family members, caretakers, and with consultants:1} {Document social determinants of health affecting pt's care:1} {Document your decision making why or why not admission, treatments were needed:1} Final Clinical Impression(s) / ED Diagnoses Final diagnoses:  None    Rx /  DC Orders ED Discharge Orders     None

## 2022-12-27 NOTE — ED Notes (Signed)
[facility] recommends placing hospitalist consult request to be sent after 7p.m.

## 2022-12-27 NOTE — Progress Notes (Signed)
Hospitalist Transfer Note:  Transferring facility: [facility] Requesting provider: Dr. Alvino Chapel (EDP at [facility]) Reason for transfer: admission for further evaluation and management of gross hematuria.     89  year old male how is chronically anticoagulated on Eliquis, who presented to [facility] ED complaining of gross hematuria over the last 1 to 2 days.  He is on Eliquis on a chronic basis, although the indication for such is not entirely clear to me at this time.  Over the last day, the patient reports some associated lightheadedness, but in the absence of any fall, syncope, chest pain, shortness of breath.   Vital signs in the ED were notable for the following: Vital signs reported to be stable, without any tachycardia or hypotension.  Labs were notable for a 2 point drop in hemoglobin relative to baseline.  EDP at [facility] discussed patient's case with the on-call urologist, Dr.Gay, Who recommended [facility] admission to [facility], and conveyed that urology will formally consult and see the patient in the morning (3/24)  Subsequently, I accepted this patient for transfer for inpatient admission to a med-tele bed at [facility] for further work-up and management of the above .       Nursing staff, Please call [facility] number on Amion [phone]) as soon as patient's arrival, so appropriate admitting provider can evaluate the pt.     Babs Bertin, DO Hospitalist

## 2022-12-27 NOTE — H&P (Incomplete)
PCP:   Binnie Rail, MD   Chief Complaint:  Blood and urine  HPI: This is a 89 year old male with past medical history significant for pulmonary hypertension, atrial fibrillation on Eliquis,  HTN, HLD, migraine, CAD, gout, CKD stage III, transcatheter aortic valve replacement, and a remote h/o history of prostate cancer.  Patient states he got up this morning and noted he was peeing bright red blood.  He came to the ER. Per patient he was started on Eliquis 2 weeks ago for new onset atrial fibrillation.  For the past week he has noted stinging whenever he pees.  He denies foul-smelling urine, fevers or chills.  He has a history of prostate cancer.  He has been in remission over 10 years.  In the ER patient with gross hematuria.  CT abdomen pelvis revealed: 5 cm lobulated density within the bladder, which could represent blood clot or bladder neoplasm. Consider cystoscopy for further evaluation.  Urologist Dr. Abner Greenspan was contacted by EDP.  Review of Systems:  The patient denies anorexia, fever, weight loss,, vision loss, decreased hearing, hoarseness, chest pain, syncope, dyspnea on exertion, balance deficits, hemoptysis, abdominal pain, melena, hematochezia, severe indigestion/heartburn, incontinence, genital sores, muscle weakness, suspicious skin lesions, transient blindness, difficulty walking, depression, unusual weight change, enlarged lymph nodes, angioedema, and breast masses. Positive: Hematuria, burning urination, peripheral edema   Past Medical History: Past Medical History:  Diagnosis Date   Anxiety    Aortic stenosis    Arthralgia    Arthritis    CAD (coronary artery disease)    Carotid stenosis    Cervicalgia    CLUSTER HEADACHE 02/10/2007   Qualifier: Diagnosis of  By: Linna Darner MD, Gwyndolyn Saxon     Colitis    Edema    Gout    Hearing loss    Herpes zoster without mention of complication    Hyperlipidemia    Hypertension    Hyperuricemia    Impacted cerumen    Migraine     Migraine    Murmur    Olecranon bursitis    OSA (obstructive sleep apnea)    Other and unspecified hyperlipidemia    Other malaise and fatigue    Peripheral neuropathy    Polyneuropathy    Prostate cancer ([place])    Pulmonary hypertension (HCC)    S/P TAVR (transcatheter aortic valve replacement) 11/12/2021   53mm S3UR via TF approach with Dr. Burt Knack and Dr. Cyndia Bent   Unspecified hereditary and idiopathic peripheral neuropathy    Venous insufficiency    Past Surgical History:  Procedure Laterality Date   ANGIOPLASTY     w/ 2 stents in [place] Bilateral    HEMORRHOID SURGERY     INTRAOPERATIVE TRANSTHORACIC ECHOCARDIOGRAM N/A 11/12/2021   Procedure: INTRAOPERATIVE TRANSTHORACIC ECHOCARDIOGRAM;  Surgeon: Sherren Mocha, MD;  Location: [place];  Service: Open Heart Surgery;  Laterality: N/A;   LIPOMA EXCISION     RIGHT/LEFT HEART CATH AND CORONARY ANGIOGRAPHY N/A 10/02/2021   Procedure: RIGHT/LEFT HEART CATH AND CORONARY ANGIOGRAPHY;  Surgeon: Sherren Mocha, MD;  Location: [facility];  Service: Cardiovascular;  Laterality: N/A;   TONSILLECTOMY     TRANSCATHETER AORTIC VALVE REPLACEMENT, TRANSFEMORAL N/A 11/12/2021   Procedure: TRANSCATHETER AORTIC VALVE REPLACEMENT, TRANSFEMORAL;  Surgeon: Sherren Mocha, MD;  Location: [facility];  Service: Open Heart Surgery;  Laterality: N/A;   ULTRASOUND GUIDANCE FOR VASCULAR ACCESS Bilateral 11/12/2021   Procedure: ULTRASOUND GUIDANCE FOR VASCULAR  ACCESS;  Surgeon: Sherren Mocha, MD;  Location: [facility];  Service: Open Heart Surgery;  Laterality: Bilateral;    Medications: Prior to Admission medications   Medication Sig Start Date End Date Taking? Authorizing Provider  allopurinol (ZYLOPRIM) 300 MG tablet TAKE 1 TABLET(300 MG) BY MOUTH DAILY AS NEEDED FOR GOUT FLARE 10/30/22   Burns, Claudina Lick, MD  amoxicillin (AMOXIL) 500 MG tablet Take 4 tablets (2,000 mg total) by mouth as directed. 1  HOUR PRIOR TO DENTAL APPOINTMENTS 11/20/21   Eileen Stanford, PA-C  apixaban (ELIQUIS) 5 MG TABS tablet Take 1 tablet (5 mg total) by mouth 2 (two) times daily. 10/28/22   Sherren Mocha, MD  Cholecalciferol (VITAMIN D3) 50 MCG (2000 UT) capsule Take 2,000 Units by mouth daily.    [provider]  furosemide (LASIX) 40 MG tablet Take 1 tablet (40 mg total) by mouth daily. 10/28/22   Sherren Mocha, MD  gabapentin (NEURONTIN) 600 MG tablet Take 1 tablet (600 mg total) by mouth at bedtime. 09/30/22   Janith Lima, MD  hydrALAZINE (APRESOLINE) 25 MG tablet TAKE 1 TABLET(25 MG) BY MOUTH IN THE MORNING AND AT BEDTIME 01/21/22   Sherren Mocha, MD  iron polysaccharides (NIFEREX) 150 MG capsule Take 1 capsule (150 mg total) by mouth daily. 09/01/22   Brunetta Genera, MD  metoprolol tartrate (LOPRESSOR) 25 MG tablet Take 1 tablet (25 mg total) by mouth 2 (two) times daily. 03/08/21   Binnie Rail, MD  potassium chloride (KLOR-CON M) 10 MEQ tablet Take 1 tablet by mouth daily (with Furosemide) 10/28/22   Sherren Mocha, MD  pravastatin (PRAVACHOL) 40 MG tablet Take 1 tablet (40 mg total) by mouth daily. 07/07/22   Binnie Rail, MD  vitamin B-12 (CYANOCOBALAMIN) 500 MCG tablet Take 500 mcg by mouth daily.    [provider]    Allergies:   Allergies  Allergen Reactions   Lisinopril Other (See Comments)    Hyperkalemia in the context of combined spironolactone and lisinopril therapy   Spironolactone Other (See Comments)    Hyperkalemia in context of spironolactone and lisinopril therapy    Social History:  reports that he has never smoked. He has never used smokeless tobacco. He reports current alcohol use of about 14.0 standard drinks of alcohol per week. He reports that he does not use drugs.  Family History: Family History  Problem Relation Age of Onset   Stroke Father        Died, 49   High blood pressure Father    Diabetes Father    Other Mother        Died,  26   Diabetes Brother        Died, 82   Diabetes Sister        Living, 83   Healthy Daughter    Healthy Son     Physical Exam: Vitals:   12/27/22 1630 12/27/22 1737 12/27/22 1930 12/27/22 2210  BP: 117/70  125/64 126/73  Pulse: (!) 56  (!) 56 77  Resp: 15  14 20   Temp:  98 F (36.7 C)  97.7 F (36.5 C)  TempSrc:  Oral  Oral  SpO2: 98%  93% 94%    General:  Alert and oriented times three, well developed and nourished, no acute distress Eyes: Pink conjunctiva, no scleral icterus ENT: Moist oral mucosa, neck supple, no thyromegaly Lungs: clear to ascultation, no wheeze, no crackles, no use of accessory muscles Cardiovascular: irregular rate and  irregular  rhythm, positive click, no regurgitation, no gallops. No carotid bruits, no JVD Abdomen: soft, positive BS, non-tender, non-distended, no organomegaly, not an acute abdomen GU: not examined Neuro: CN II - XII grossly intact, sensation intact Musculoskeletal: strength 5/5 all extremities, no clubbing. 3+ B/L LE pitting edema Skin: no rash, no subcutaneous crepitation, no decubitus Psych: appropriate patient   Labs on Admission:  Recent Labs    12/27/22 1340  NA 142  K 4.7  CL 108  CO2 28  GLUCOSE 91  BUN 46*  CREATININE 1.81*  CALCIUM 9.6   Recent Labs    12/27/22 1340  WBC 6.6  HGB 8.9*  HCT 28.6*  MCV 91.7  PLT 152    Radiological Exams on Admission: CT Renal Stone Study  Result Date: 12/27/2022 CLINICAL DATA:  Hematuria and dysuria.  Urinary retention. EXAM: CT ABDOMEN AND PELVIS WITHOUT CONTRAST TECHNIQUE: Multidetector CT imaging of the abdomen and pelvis was performed following the standard protocol without IV contrast. RADIATION DOSE REDUCTION: This exam was performed according to the departmental dose-optimization program which includes automated exposure control, adjustment of the mA and/or kV according to patient size and/or use of iterative reconstruction technique. COMPARISON:  03/02/2021  FINDINGS: Lower chest: Small bilateral pleural effusions without significant change. Stable cardiomegaly. Hepatobiliary: No mass visualized on this unenhanced exam. Fluid attenuation cyst again noted in the left hepatic lobe. Tiny calcified gallstones are seen, however there is no evidence of cholecystitis or biliary ductal dilatation. Pancreas: No mass or inflammatory process visualized on this unenhanced exam. Spleen:  Within normal limits in size. Adrenals/Urinary tract: 2 mm calculus noted in lower pole of left kidney. No evidence of ureteral calculi or hydronephrosis. Lobulated density is seen within the bladder lumen along the posterior wall which measures 5.1 x 3.0 cm. This has attenuation value of 42 Hounsfield units. This could represent blood clot or bladder neoplasm. Stomach/Bowel: No evidence of obstruction, inflammatory process, or abnormal fluid collections. Vascular/Lymphatic: No pathologically enlarged lymph nodes identified. No evidence of abdominal aortic aneurysm. Aortic atherosclerotic calcification incidentally noted. Reproductive:  Brachytherapy seeds seen throughout prostate gland. Other:  Large left scrotal hydrocele is again seen. Musculoskeletal:  No suspicious bone lesions identified. IMPRESSION: 5 cm lobulated density within the bladder, which could represent blood clot or bladder neoplasm. Consider cystoscopy for further evaluation. Tiny nonobstructing left renal calculus. No evidence of ureteral calculi or hydronephrosis. Cholelithiasis. No radiographic evidence of cholecystitis. Large left scrotal hydrocele. Stable small bilateral pleural effusions. Aortic Atherosclerosis (ICD10-I70.0). Electronically Signed   By: Marlaine Hind M.D.   On: 12/27/2022 15:31    Assessment/Plan Present on Admission:  Gross hematuria/anemia d/t acute hemorrhage  5 cm lobulated density within the bladder  -Eliquis discontinued. No anticoagulation needed -Patients baseline Hgb ~11 (08/18/22), todays Hgb  8.9 => 8.5 -Dr Abner Greenspan at bedside, irrigating bladder. Continuous bladder irrigation if possible -serial H/H q6 for 48hrs -IVF hydration -NPO at midnight, cystocscopy/biopsy bladder mass   Possible UTI -unable to obtain cultures d/t ongoing bleeding and continuous bladder irrigation -empiric antibiotics started rocephin, will continue   Atrial fibrillation -Rate controlled. CHADvasc >2, Eliquis started 10/28/22. Discontinued. Last dose 12/27/22 in AM -Continue metoprolol with hold parameters   S/p TAVR bioprosthetic valve -No need for anticoagulation   Prostate cancer ([place]) -remote h/o.    CAD (coronary artery disease) -Resume Lopressor, pravastatin, hydralazine.   Chronic LE edema -Will consider IV Lasix if patient remains hemodynamically stable.  For now monitor   Essential hypertension -cont metoprolol and  hydralazine with hold parameters -vitals stable   Gout -Holding allopurinol   Hyperlipidemia -continue pravastatin   Peripheral neuropathy -continue neurontin 600mg  PO QHS   H/o thrombocytopenia -Today platelet 152, normal   Shontelle Muska 12/27/2022, 10:16 PM

## 2022-12-27 NOTE — ED Triage Notes (Signed)
Pt presents POV from home  Pt reports burning with urination x1 week, began peeing blood this morning. Pt 's PCP sent him to the ED for further evaluation. Pt denies any hx of UTI or bloody urine in the past.

## 2022-12-27 NOTE — ED Notes (Signed)
Bladder scan reading volume greater then 397 ml.

## 2022-12-27 NOTE — Consult Note (Signed)
Urology Consult   Physician requesting consult: Babs Bertin, MD  Reason for consult: Gross hematuria  History of Present Illness: Paul Mays is a 89 y.o. who has a past medical history of pulmonary hypertension, atrial fibrillation on Eliquis, hypertension, hyperlipidemia, migraine, CAD, transcatheter aortic valve replacement, CKD stage III, gout.  He also has a history of localized low risk prostate cancer who under point brachytherapy in 03/1999 under the care of Dr. Terance Hart.  He was last seen in [facility] in 2016 with PSA of 0.01 under the care of Dr. Diona Fanti.  Patient states that over the past week, he has had some dysuria and woke up this morning with gross hematuria.  He denies prior history of gross hematuria.  He denies a smoking history.  He denies a family history of bladder cancer or kidney cancer.  He presented to the ED and had a bladder scan of 397 mL.  Foley catheter was placed with return of clots.  CT noncontrast 12/27/2022 with 5 cm lobulated density within the bladder which could represent blood clot or bladder neoplasm.  He also had a tiny nonobstructing left renal stone with no evidence of ureteral stone or hydronephrosis.  He has had a large left scrotal hydrocele.  He denies any abdominal pain or flank pain.  He is tolerating catheter very well.  Past Medical History:  Diagnosis Date   Anxiety    Aortic stenosis    Arthralgia    Arthritis    CAD (coronary artery disease)    Carotid stenosis    Cervicalgia    CLUSTER HEADACHE 02/10/2007   Qualifier: Diagnosis of  By: Linna Darner MD, Gwyndolyn Saxon     Colitis    Edema    Gout    Hearing loss    Herpes zoster without mention of complication    Hyperlipidemia    Hypertension    Hyperuricemia    Impacted cerumen    Migraine    Migraine    Murmur    Olecranon bursitis    OSA (obstructive sleep apnea)    Other and unspecified hyperlipidemia    Other malaise and fatigue    Peripheral neuropathy     Polyneuropathy    Prostate cancer ([place])    Pulmonary hypertension (HCC)    S/P TAVR (transcatheter aortic valve replacement) 11/12/2021   65mm S3UR via TF approach with Dr. Burt Knack and Dr. Cyndia Bent   Unspecified hereditary and idiopathic peripheral neuropathy    Venous insufficiency     Past Surgical History:  Procedure Laterality Date   ANGIOPLASTY     w/ 2 stents in [place] Bilateral    HEMORRHOID SURGERY     INTRAOPERATIVE TRANSTHORACIC ECHOCARDIOGRAM N/A 11/12/2021   Procedure: INTRAOPERATIVE TRANSTHORACIC ECHOCARDIOGRAM;  Surgeon: Sherren Mocha, MD;  Location: [place];  Service: Open Heart Surgery;  Laterality: N/A;   LIPOMA EXCISION     RIGHT/LEFT HEART CATH AND CORONARY ANGIOGRAPHY N/A 10/02/2021   Procedure: RIGHT/LEFT HEART CATH AND CORONARY ANGIOGRAPHY;  Surgeon: Sherren Mocha, MD;  Location: [facility];  Service: Cardiovascular;  Laterality: N/A;   TONSILLECTOMY     TRANSCATHETER AORTIC VALVE REPLACEMENT, TRANSFEMORAL N/A 11/12/2021   Procedure: TRANSCATHETER AORTIC VALVE REPLACEMENT, TRANSFEMORAL;  Surgeon: Sherren Mocha, MD;  Location: [facility];  Service: Open Heart Surgery;  Laterality: N/A;   ULTRASOUND GUIDANCE FOR VASCULAR ACCESS Bilateral 11/12/2021   Procedure: ULTRASOUND GUIDANCE FOR VASCULAR ACCESS;  Surgeon: Burt Knack,  Legrand Como, MD;  Location: [facility];  Service: Open Heart Surgery;  Laterality: Bilateral;    Current Hospital Medications:  Home Meds:  No current facility-administered medications on file prior to encounter.   Current Outpatient Medications on File Prior to Encounter  Medication Sig Dispense Refill   allopurinol (ZYLOPRIM) 300 MG tablet TAKE 1 TABLET(300 MG) BY MOUTH DAILY AS NEEDED FOR GOUT FLARE 90 tablet 0   amoxicillin (AMOXIL) 500 MG tablet Take 4 tablets (2,000 mg total) by mouth as directed. 1 HOUR PRIOR TO DENTAL APPOINTMENTS 12 tablet 6   apixaban (ELIQUIS) 5 MG TABS tablet Take  1 tablet (5 mg total) by mouth 2 (two) times daily. 180 tablet 3   Cholecalciferol (VITAMIN D3) 50 MCG (2000 UT) capsule Take 2,000 Units by mouth daily.     furosemide (LASIX) 40 MG tablet Take 1 tablet (40 mg total) by mouth daily. 90 tablet 3   gabapentin (NEURONTIN) 600 MG tablet Take 1 tablet (600 mg total) by mouth at bedtime. 90 tablet 1   hydrALAZINE (APRESOLINE) 25 MG tablet TAKE 1 TABLET(25 MG) BY MOUTH IN THE MORNING AND AT BEDTIME 60 tablet 10   iron polysaccharides (NIFEREX) 150 MG capsule Take 1 capsule (150 mg total) by mouth daily. 30 capsule 5   metoprolol tartrate (LOPRESSOR) 25 MG tablet Take 1 tablet (25 mg total) by mouth 2 (two) times daily. 180 tablet 3   potassium chloride (KLOR-CON M) 10 MEQ tablet Take 1 tablet by mouth daily (with Furosemide) 90 tablet 3   pravastatin (PRAVACHOL) 40 MG tablet Take 1 tablet (40 mg total) by mouth daily. 90 tablet 3   vitamin B-12 (CYANOCOBALAMIN) 500 MCG tablet Take 500 mcg by mouth daily.       Scheduled Meds: Continuous Infusions:  [START ON 12/28/2022] cefTRIAXone (ROCEPHIN)  IV     PRN Meds:.acetaminophen **OR** acetaminophen, ondansetron **OR** ondansetron (ZOFRAN) IV, senna-docusate  Allergies:  Allergies  Allergen Reactions   Lisinopril Other (See Comments)    Hyperkalemia in the context of combined spironolactone and lisinopril therapy   Spironolactone Other (See Comments)    Hyperkalemia in context of spironolactone and lisinopril therapy    Family History  Problem Relation Age of Onset   Stroke Father        Died, 83   High blood pressure Father    Diabetes Father    Other Mother        Died, 71   Diabetes Brother        Died, 82   Diabetes Sister        Living, 46   Healthy Daughter    Healthy Son     Social History:  reports that he has never smoked. He has never used smokeless tobacco. He reports current alcohol use of about 14.0 standard drinks of alcohol per week. He reports that he does not use  drugs.  ROS: A complete review of systems was performed.  All systems are negative except for pertinent findings as noted.  Physical Exam:  Vital signs in last 24 hours: Temp:  [97.7 F (36.5 C)-98 F (36.7 C)] 97.7 F (36.5 C) (03/23 2210) Pulse Rate:  [53-77] 77 (03/23 2210) Resp:  [14-20] 20 (03/23 2210) BP: (101-126)/(52-73) 126/73 (03/23 2210) SpO2:  [89 %-98 %] 94 % (03/23 2210) Weight:  [65 kg] 65 kg (03/23 2210) Constitutional:  Alert and oriented, No acute distress Cardiovascular: Regular rate and rhythm Respiratory: Normal respiratory effort, Lungs clear bilaterally GI: Abdomen is soft,  nontender, nondistended, no abdominal masses GU: No CVA tenderness Neurologic: Grossly intact, no focal deficits Psychiatric: Normal mood and affect  Laboratory Data:  Recent Labs    12/27/22 1340 12/27/22 2226  WBC 6.6  --   HGB 8.9* 8.5*  HCT 28.6* 27.7*  PLT 152  --     Recent Labs    12/27/22 1340  NA 142  K 4.7  CL 108  GLUCOSE 91  BUN 46*  CALCIUM 9.6  CREATININE 1.81*     Results for orders placed or performed during the hospital encounter of 12/27/22 (from the past 24 hour(s))  Basic metabolic panel     Status: Abnormal   Collection Time: 12/27/22  1:40 PM  Result Value Ref Range   Sodium 142 135 - 145 mmol/L   Potassium 4.7 3.5 - 5.1 mmol/L   Chloride 108 98 - 111 mmol/L   CO2 28 22 - 32 mmol/L   Glucose, Bld 91 70 - 99 mg/dL   BUN 46 (H) 8 - 23 mg/dL   Creatinine, Ser 1.81 (H) 0.61 - 1.24 mg/dL   Calcium 9.6 8.9 - 10.3 mg/dL   GFR, Estimated 34 (L) >60 mL/min   Anion gap 6 5 - 15  CBC     Status: Abnormal   Collection Time: 12/27/22  1:40 PM  Result Value Ref Range   WBC 6.6 4.0 - 10.5 K/uL   RBC 3.12 (L) 4.22 - 5.81 MIL/uL   Hemoglobin 8.9 (L) 13.0 - 17.0 g/dL   HCT 28.6 (L) 39.0 - 52.0 %   MCV 91.7 80.0 - 100.0 fL   MCH 28.5 26.0 - 34.0 pg   MCHC 31.1 30.0 - 36.0 g/dL   RDW 14.9 11.5 - 15.5 %   Platelets 152 150 - 400 K/uL   nRBC 0.0 0.0 -  0.2 %  Urinalysis, Routine w reflex microscopic -Urine, Clean Catch     Status: Abnormal   Collection Time: 12/27/22  4:27 PM  Result Value Ref Range   Color, Urine RED (A) YELLOW   APPearance TURBID (A) CLEAR   Specific Gravity, Urine  1.005 - 1.030    TEST NOT REPORTED DUE TO COLOR INTERFERENCE OF URINE PIGMENT   pH  5.0 - 8.0    TEST NOT REPORTED DUE TO COLOR INTERFERENCE OF URINE PIGMENT   Glucose, UA (A) NEGATIVE mg/dL    TEST NOT REPORTED DUE TO COLOR INTERFERENCE OF URINE PIGMENT   Hgb urine dipstick (A) NEGATIVE    TEST NOT REPORTED DUE TO COLOR INTERFERENCE OF URINE PIGMENT   Bilirubin Urine (A) NEGATIVE    TEST NOT REPORTED DUE TO COLOR INTERFERENCE OF URINE PIGMENT   Ketones, ur (A) NEGATIVE mg/dL    TEST NOT REPORTED DUE TO COLOR INTERFERENCE OF URINE PIGMENT   Protein, ur (A) NEGATIVE mg/dL    TEST NOT REPORTED DUE TO COLOR INTERFERENCE OF URINE PIGMENT   Nitrite (A) NEGATIVE    TEST NOT REPORTED DUE TO COLOR INTERFERENCE OF URINE PIGMENT   Leukocytes,Ua (A) NEGATIVE    TEST NOT REPORTED DUE TO COLOR INTERFERENCE OF URINE PIGMENT  Urinalysis, Microscopic (reflex)     Status: Abnormal   Collection Time: 12/27/22  4:27 PM  Result Value Ref Range   RBC / HPF >50 0 - 5 RBC/hpf   WBC, UA 11-20 0 - 5 WBC/hpf   Bacteria, UA FEW (A) NONE SEEN   Squamous Epithelial / HPF 0-5 0 - 5 /HPF  Hemoglobin and hematocrit,  blood     Status: Abnormal   Collection Time: 12/27/22 10:26 PM  Result Value Ref Range   Hemoglobin 8.5 (L) 13.0 - 17.0 g/dL   HCT 27.7 (L) 39.0 - 52.0 %   No results found for this or any previous visit (from the past 240 hour(s)).  Renal Function: Recent Labs    12/27/22 1340  CREATININE 1.81*   Estimated Creatinine Clearance: 21.4 mL/min (A) (by C-G formula based on SCr of 1.81 mg/dL (H)).  Radiologic Imaging: CT Renal Stone Study  Result Date: 12/27/2022 CLINICAL DATA:  Hematuria and dysuria.  Urinary retention. EXAM: CT ABDOMEN AND PELVIS WITHOUT  CONTRAST TECHNIQUE: Multidetector CT imaging of the abdomen and pelvis was performed following the standard protocol without IV contrast. RADIATION DOSE REDUCTION: This exam was performed according to the departmental dose-optimization program which includes automated exposure control, adjustment of the mA and/or kV according to patient size and/or use of iterative reconstruction technique. COMPARISON:  03/02/2021 FINDINGS: Lower chest: Small bilateral pleural effusions without significant change. Stable cardiomegaly. Hepatobiliary: No mass visualized on this unenhanced exam. Fluid attenuation cyst again noted in the left hepatic lobe. Tiny calcified gallstones are seen, however there is no evidence of cholecystitis or biliary ductal dilatation. Pancreas: No mass or inflammatory process visualized on this unenhanced exam. Spleen:  Within normal limits in size. Adrenals/Urinary tract: 2 mm calculus noted in lower pole of left kidney. No evidence of ureteral calculi or hydronephrosis. Lobulated density is seen within the bladder lumen along the posterior wall which measures 5.1 x 3.0 cm. This has attenuation value of 42 Hounsfield units. This could represent blood clot or bladder neoplasm. Stomach/Bowel: No evidence of obstruction, inflammatory process, or abnormal fluid collections. Vascular/Lymphatic: No pathologically enlarged lymph nodes identified. No evidence of abdominal aortic aneurysm. Aortic atherosclerotic calcification incidentally noted. Reproductive:  Brachytherapy seeds seen throughout prostate gland. Other:  Large left scrotal hydrocele is again seen. Musculoskeletal:  No suspicious bone lesions identified. IMPRESSION: 5 cm lobulated density within the bladder, which could represent blood clot or bladder neoplasm. Consider cystoscopy for further evaluation. Tiny nonobstructing left renal calculus. No evidence of ureteral calculi or hydronephrosis. Cholelithiasis. No radiographic evidence of  cholecystitis. Large left scrotal hydrocele. Stable small bilateral pleural effusions. Aortic Atherosclerosis (ICD10-I70.0). Electronically Signed   By: Marlaine Hind M.D.   On: 12/27/2022 15:31    I independently reviewed the above imaging studies.   Procedure note: I removed his indwelling catheter.  Under sterile conditions, I instilled lidocaine jelly into the urethra.  I then used serial urethral dilators to dilate from 20 [place] to 26 [place].  I then passed a 24 [place] three-way Foley catheter into the bladder.  I then irrigated significant mount of clot burden out of his bladder until there is no clot remaining.  Irrigant was light pink at the end of irrigation.  CBI was initiated.  Impression/Recommendation  #1.  Gross hematuria: Etiology likely related to radiation cystitis given history of prior brachytherapy.  Will need full outpatient evaluation to rule out underlying bladder mass.  #2.  Prostate cancer: Localized prostate cancer treated with brachytherapy in 2000 with PSA nadir at 0.1 last followed in 2016.  -Continuous bladder irrigation.  Manually irrigate every 4 hours and as needed for clots or decreased drainage. -N.p.o. after midnight in case significant bleeding would require fulguration -Will need outpatient cystoscopy and CT hematuria protocol for full evaluation. -Following  Matt R. Jannely Henthorn MD 12/27/2022, [facility]  Pager: [phone]  CC: Babs Bertin, MD

## 2022-12-28 ENCOUNTER — Other Ambulatory Visit: Payer: Self-pay

## 2022-12-28 DIAGNOSIS — R310 Gross hematuria: Secondary | ICD-10-CM | POA: Diagnosis not present

## 2022-12-28 LAB — HEMOGLOBIN AND HEMATOCRIT, BLOOD
HCT: 27 % — ABNORMAL LOW (ref 39.0–52.0)
HCT: 27.9 % — ABNORMAL LOW (ref 39.0–52.0)
HCT: 28.1 % — ABNORMAL LOW (ref 39.0–52.0)
HCT: 27.9 % — ABNORMAL LOW (ref 39.0–52.0)
Hemoglobin: 8 g/dL — ABNORMAL LOW (ref 13.0–17.0)
Hemoglobin: 8.4 g/dL — ABNORMAL LOW (ref 13.0–17.0)
Hemoglobin: 8.5 g/dL — ABNORMAL LOW (ref 13.0–17.0)
Hemoglobin: 8.3 g/dL — ABNORMAL LOW (ref 13.0–17.0)

## 2022-12-28 LAB — BASIC METABOLIC PANEL
Anion gap: 8 (ref 5–15)
BUN: 44 mg/dL — ABNORMAL HIGH (ref 8–23)
CO2: 23 mmol/L (ref 22–32)
Calcium: 8.8 mg/dL — ABNORMAL LOW (ref 8.9–10.3)
Chloride: 110 mmol/L (ref 98–111)
Creatinine, Ser: 1.43 mg/dL — ABNORMAL HIGH (ref 0.61–1.24)
GFR, Estimated: 46 mL/min — ABNORMAL LOW (ref >60)
Glucose, Bld: 97 mg/dL (ref 70–99)
Potassium: 4.3 mmol/L (ref 3.5–5.1)
Sodium: 141 mmol/L (ref 135–145)

## 2022-12-28 MED ORDER — GABAPENTIN 300 MG PO CAPS
600.0000 mg | ORAL_CAPSULE | Freq: Every day | ORAL | Status: DC
  Administered 2022-12-28 – 2023-01-03 (×7): 600 mg via ORAL
  Filled 2022-12-28 (×7): qty 2

## 2022-12-28 MED ORDER — METOPROLOL TARTRATE 25 MG PO TABS
25.0000 mg | ORAL_TABLET | Freq: Two times a day (BID) | ORAL | Status: DC
  Administered 2022-12-28 – 2023-01-04 (×13): 25 mg via ORAL
  Filled 2022-12-28 (×14): qty 1

## 2022-12-28 MED ORDER — LACTATED RINGERS IV SOLN: INTRAVENOUS | Status: DC

## 2022-12-28 MED ORDER — CHLORHEXIDINE GLUCONATE CLOTH 2 % EX PADS
6.0000 | MEDICATED_PAD | Freq: Every day | CUTANEOUS | Status: DC
  Administered 2022-12-28 – 2023-01-04 (×8): 6 via TOPICAL

## 2022-12-28 MED ORDER — HYDRALAZINE HCL 25 MG PO TABS
25.0000 mg | ORAL_TABLET | Freq: Two times a day (BID) | ORAL | Status: DC
  Administered 2022-12-28 – 2023-01-04 (×14): 25 mg via ORAL
  Filled 2022-12-28 (×14): qty 1

## 2022-12-28 MED ORDER — HYDROMORPHONE HCL 1 MG/ML IJ SOLN
0.5000 mg | INTRAMUSCULAR | Status: DC | PRN
  Administered 2022-12-28 – 2022-12-31 (×10): 0.5 mg via INTRAVENOUS
  Filled 2022-12-28 (×10): qty 0.5

## 2022-12-28 NOTE — Progress Notes (Signed)
PROGRESS NOTE    Paul Mays  [id number] DOB: 02-05-1928 DOA: 12/27/2022 PCP: Binnie Rail, MD   Brief Narrative: 89 year old male with past medical history significant for pulmonary hypertension, atrial fibrillation on Eliquis,HTN, HLD, migraine, CAD, gout, CKD stage III, transcatheter aortic valve replacement, and a remote h/o history of prostate cancer.  He is admitted with gross hematuria.  Patient was started on Eliquis on 10/28/2022 for new onset atrial fibrillation. He underwent brachytherapy in 2000 CT renal stone study-12/27/2022-5 cm lobulated density within the bladder, which could represent blood clot or bladder neoplasm. Consider cystoscopy for further evaluation.Tiny nonobstructing left renal calculus. No evidence of ureteral calculi or hydronephrosis.Cholelithiasis. No radiographic evidence of cholecystitis.Large left scrotal hydrocele.Stable small bilateral pleural effusions.   Assessment & Plan:   Principal Problem:   Gross hematuria Active Problems:   Hyperlipidemia   Gout   Essential hypertension   Severe aortic stenosis   OBSTRUCTIVE SLEEP APNEA   Fall   CAD (coronary artery disease)   Prostate cancer ([place])  #1 gross hematuria-in the setting of recently started Eliquis on 10/28/2022 and history of brachytherapy. Patient has been started on continuous bladder irrigation with clearing of urine. No blood clots were noted in the bag today. Eliquis has been discontinued. Patient seen by urology Dr. Abner Greenspan -thinks this is likely from radiation cystitis from prior brachytherapy. Patient has history of localized prostate cancer followed by urology as an outpatient but last seen in 2016.  #2 new onset atrial fibrillation (CHA2DS2-VASc score of 5 age, CHF, hypertension) since January 2024.  Patient was seen by Dr. Burt Knack started on Eliquis 5 mg twice daily on October 28, 2022. Eliquis was been stopped due to gross hematuria.    #3 CAD/status post TAVR bioprosthetic  valve February 2023 stable  #4 history of essential hypertension on metoprolol hydralazine  #5 gout on allopurinol  #6 hyperlipidemia on pravastatin  #7 peripheral neuropathy on Neurontin  #8 history of diastolic heart failure on Lasix as needed at home  #9 acute on chronic anemia of chronic disease hemoglobin 8 from 8.9 admitted with gross hematuria will need to monitor closely  #10 AKI -creatinine with some improvement since placing Foley Creatinine 1.43 from 1.81  Estimated body mass index is 24.6 kg/m as calculated from the following:   Height as of this encounter: 5\' 4"  (1.626 m).   Weight as of this encounter: 65 kg.  DVT prophylaxis: None due to gross hematuria Code Status: DNR  family Communication: None at bedside will call family Disposition Plan:  Status is: Inpatient Remains inpatient appropriate because: Gross hematuria   Consultants:  Urology  Procedures: Continuous bladder irrigation Antimicrobials: Rocephin  Subjective: Patient resting in bed awake and alert  Objective: Vitals:   12/27/22 1930 12/27/22 2210 12/28/22 0135 12/28/22 0609  BP: 125/64 126/73 121/69 122/64  Pulse: (!) 56 77 67 (!) 57  Resp: 14 20 16 14   Temp:  97.7 F (36.5 C) 97.8 F (36.6 C) 97.8 F (36.6 C)  TempSrc:  Oral Oral Oral  SpO2: 93% 94% 91% 92%  Weight:  65 kg    Height:  5\' 4"  (1.626 m)      Intake/Output Summary (Last 24 hours) at 12/28/2022 0819 Last data filed at 12/28/2022 0726 Gross per 24 hour  Intake 18799.69 ml  Output 20850 ml  Net -2050.31 ml   Filed Weights   12/27/22 2210  Weight: 65 kg    Examination: CBI in progress Foley bag still with blood-tinged urine  General exam: Appears in no acute distress Respiratory system: Clear to auscultation. Respiratory effort normal. Cardiovascular system: S1 & S2 heard, RRR. No JVD, murmurs, rubs, gallops or clicks. No pedal edema. Gastrointestinal system: Abdomen is nondistended, soft and nontender. No  organomegaly or masses felt. Normal bowel sounds heard. Central nervous system: Alert and oriented. No focal neurological deficits. Extremities: 1+ edema  skin: No rashes, lesions or ulcers Psychiatry: Judgement and insight appear normal. Mood & affect appropriate.     Data Reviewed: I have personally reviewed following labs and imaging studies  CBC: Recent Labs  Lab 12/27/22 1340 12/27/22 2226 12/28/22 0356  WBC 6.6  --   --   HGB 8.9* 8.5* 8.0*  HCT 28.6* 27.7* 27.0*  MCV 91.7  --   --   PLT 152  --   --    Basic Metabolic Panel: Recent Labs  Lab 12/27/22 1340 12/28/22 0356  NA 142 141  K 4.7 4.3  CL 108 110  CO2 28 23  GLUCOSE 91 97  BUN 46* 44*  CREATININE 1.81* 1.43*  CALCIUM 9.6 8.8*   GFR: Estimated Creatinine Clearance: 27 mL/min (A) (by C-G formula based on SCr of 1.43 mg/dL (H)). Liver Function Tests: No results for input(s): "AST", "ALT", "ALKPHOS", "BILITOT", "PROT", "ALBUMIN" in the last 168 hours. No results for input(s): "LIPASE", "AMYLASE" in the last 168 hours. No results for input(s): "AMMONIA" in the last 168 hours. Coagulation Profile: No results for input(s): "INR", "PROTIME" in the last 168 hours. Cardiac Enzymes: No results for input(s): "CKTOTAL", "CKMB", "CKMBINDEX", "TROPONINI" in the last 168 hours. BNP (last 3 results) No results for input(s): "PROBNP" in the last 8760 hours. HbA1C: No results for input(s): "HGBA1C" in the last 72 hours. CBG: No results for input(s): "GLUCAP" in the last 168 hours. Lipid Profile: No results for input(s): "CHOL", "HDL", "LDLCALC", "TRIG", "CHOLHDL", "LDLDIRECT" in the last 72 hours. Thyroid Function Tests: No results for input(s): "TSH", "T4TOTAL", "FREET4", "T3FREE", "THYROIDAB" in the last 72 hours. Anemia Panel: No results for input(s): "VITAMINB12", "FOLATE", "FERRITIN", "TIBC", "IRON", "RETICCTPCT" in the last 72 hours. Sepsis Labs: No results for input(s): "PROCALCITON", "LATICACIDVEN" in the  last 168 hours.  No results found for this or any previous visit (from the past 240 hour(s)).       Radiology Studies: CT Renal Stone Study  Result Date: 12/27/2022 CLINICAL DATA:  Hematuria and dysuria.  Urinary retention. EXAM: CT ABDOMEN AND PELVIS WITHOUT CONTRAST TECHNIQUE: Multidetector CT imaging of the abdomen and pelvis was performed following the standard protocol without IV contrast. RADIATION DOSE REDUCTION: This exam was performed according to the departmental dose-optimization program which includes automated exposure control, adjustment of the mA and/or kV according to patient size and/or use of iterative reconstruction technique. COMPARISON:  03/02/2021 FINDINGS: Lower chest: Small bilateral pleural effusions without significant change. Stable cardiomegaly. Hepatobiliary: No mass visualized on this unenhanced exam. Fluid attenuation cyst again noted in the left hepatic lobe. Tiny calcified gallstones are seen, however there is no evidence of cholecystitis or biliary ductal dilatation. Pancreas: No mass or inflammatory process visualized on this unenhanced exam. Spleen:  Within normal limits in size. Adrenals/Urinary tract: 2 mm calculus noted in lower pole of left kidney. No evidence of ureteral calculi or hydronephrosis. Lobulated density is seen within the bladder lumen along the posterior wall which measures 5.1 x 3.0 cm. This has attenuation value of 42 Hounsfield units. This could represent blood clot or bladder neoplasm. Stomach/Bowel: No evidence of obstruction,  inflammatory process, or abnormal fluid collections. Vascular/Lymphatic: No pathologically enlarged lymph nodes identified. No evidence of abdominal aortic aneurysm. Aortic atherosclerotic calcification incidentally noted. Reproductive:  Brachytherapy seeds seen throughout prostate gland. Other:  Large left scrotal hydrocele is again seen. Musculoskeletal:  No suspicious bone lesions identified. IMPRESSION: 5 cm lobulated  density within the bladder, which could represent blood clot or bladder neoplasm. Consider cystoscopy for further evaluation. Tiny nonobstructing left renal calculus. No evidence of ureteral calculi or hydronephrosis. Cholelithiasis. No radiographic evidence of cholecystitis. Large left scrotal hydrocele. Stable small bilateral pleural effusions. Aortic Atherosclerosis (ICD10-I70.0). Electronically Signed   By: Marlaine Hind M.D.   On: 12/27/2022 15:31        Scheduled Meds:  Chlorhexidine Gluconate Cloth  6 each Topical Daily   gabapentin  600 mg Oral QHS   hydrALAZINE  25 mg Oral BID   metoprolol tartrate  25 mg Oral BID   Continuous Infusions:  cefTRIAXone (ROCEPHIN)  IV     lactated ringers 75 mL/hr at 12/28/22 0513     LOS: 1 day    Time spent: 10 min  Georgette Shell, MD  12/28/2022, 8:19 AM

## 2022-12-28 NOTE — Plan of Care (Signed)
  Problem: Education: Goal: Knowledge of General Education information will improve Description: Including pain rating scale, medication(s)/side effects and non-pharmacologic comfort measures Outcome: Progressing   Problem: Elimination: Goal: Will not experience complications related to urinary retention Outcome: Progressing   Problem: Pain Managment: Goal: General experience of comfort will improve Outcome: Progressing   

## 2022-12-28 NOTE — Progress Notes (Addendum)
Subjective: Doing very well, denies abdominal pain, flank pain.  Tolerating Foley catheter.  Afebrile.  Objective: Vital signs in last 24 hours: Temp:  [97.7 F (36.5 C)-98 F (36.7 C)] 97.8 F (36.6 C) (03/24 0609) Pulse Rate:  [53-77] 59 (03/24 0903) Resp:  [14-20] 14 (03/24 0609) BP: (101-146)/(52-73) 146/62 (03/24 0903) SpO2:  [89 %-98 %] 92 % (03/24 0609) Weight:  [65 kg] 65 kg (03/23 2210)  Intake/Output from previous day: 03/23 0701 - 03/24 0700 In: 18799.7 [IV Piggyback:99.7] Out: [id number] [Urine:19650] Intake/Output this shift: Total I/O In: -  Out: 1200 [Urine:1200] Urine clear on very slow drip  Physical Exam:  General: Alert and oriented CV: RRR Lungs: Clear Abdomen: Soft, nondistended, nontender Ext: NT, No erythema  Lab Results: Recent Labs    12/27/22 1340 12/27/22 2226 12/28/22 0356  HGB 8.9* 8.5* 8.0*  HCT 28.6* 27.7* 27.0*   BMET Recent Labs    12/27/22 1340 12/28/22 0356  NA 142 141  K 4.7 4.3  CL 108 110  CO2 28 23  GLUCOSE 91 97  BUN 46* 44*  CREATININE 1.81* 1.43*  CALCIUM 9.6 8.8*     Studies/Results: CT Renal Stone Study  Result Date: 12/27/2022 CLINICAL DATA:  Hematuria and dysuria.  Urinary retention. EXAM: CT ABDOMEN AND PELVIS WITHOUT CONTRAST TECHNIQUE: Multidetector CT imaging of the abdomen and pelvis was performed following the standard protocol without IV contrast. RADIATION DOSE REDUCTION: This exam was performed according to the departmental dose-optimization program which includes automated exposure control, adjustment of the mA and/or kV according to patient size and/or use of iterative reconstruction technique. COMPARISON:  03/02/2021 FINDINGS: Lower chest: Small bilateral pleural effusions without significant change. Stable cardiomegaly. Hepatobiliary: No mass visualized on this unenhanced exam. Fluid attenuation cyst again noted in the left hepatic lobe. Tiny calcified gallstones are seen, however there is no evidence  of cholecystitis or biliary ductal dilatation. Pancreas: No mass or inflammatory process visualized on this unenhanced exam. Spleen:  Within normal limits in size. Adrenals/Urinary tract: 2 mm calculus noted in lower pole of left kidney. No evidence of ureteral calculi or hydronephrosis. Lobulated density is seen within the bladder lumen along the posterior wall which measures 5.1 x 3.0 cm. This has attenuation value of 42 Hounsfield units. This could represent blood clot or bladder neoplasm. Stomach/Bowel: No evidence of obstruction, inflammatory process, or abnormal fluid collections. Vascular/Lymphatic: No pathologically enlarged lymph nodes identified. No evidence of abdominal aortic aneurysm. Aortic atherosclerotic calcification incidentally noted. Reproductive:  Brachytherapy seeds seen throughout prostate gland. Other:  Large left scrotal hydrocele is again seen. Musculoskeletal:  No suspicious bone lesions identified. IMPRESSION: 5 cm lobulated density within the bladder, which could represent blood clot or bladder neoplasm. Consider cystoscopy for further evaluation. Tiny nonobstructing left renal calculus. No evidence of ureteral calculi or hydronephrosis. Cholelithiasis. No radiographic evidence of cholecystitis. Large left scrotal hydrocele. Stable small bilateral pleural effusions. Aortic Atherosclerosis (ICD10-I70.0). Electronically Signed   By: Marlaine Hind M.D.   On: 12/27/2022 15:31    Assessment/Plan: #1.  Gross hematuria: Etiology likely related to radiation cystitis given history of prior brachytherapy.  Will need full outpatient evaluation to rule out underlying bladder mass.   #2.  Prostate cancer: Localized prostate cancer treated with brachytherapy in 2000 with PSA nadir at 0.1 last followed in 2016.   -Urine clear on slow drip. Irrigates without clots.  -Slowed CBI to 1gtt/3seconds -Manually irrigate as needed for clots or decreased drainage -No need for fulguration  today -Possibly  void trial tomorrow -Will need outpatient cystoscopy and CT hematuria protocol for full evaluation. Messaged schedulers to arrange. -Following     LOS: 1 day   Matt R. Archer Moist MD 12/28/2022, 9:16 AM [facility]  Pager: [phone]

## 2022-12-29 ENCOUNTER — Other Ambulatory Visit: Payer: Medicare Other

## 2022-12-29 ENCOUNTER — Ambulatory Visit: Payer: Medicare Other | Admitting: Hematology

## 2022-12-29 DIAGNOSIS — R310 Gross hematuria: Secondary | ICD-10-CM | POA: Diagnosis not present

## 2022-12-29 LAB — CBC
HCT: 27 % — ABNORMAL LOW (ref 39.0–52.0)
Hemoglobin: 8.2 g/dL — ABNORMAL LOW (ref 13.0–17.0)
MCH: 28.8 pg (ref 26.0–34.0)
MCHC: 30.4 g/dL (ref 30.0–36.0)
MCV: 94.7 fL (ref 80.0–100.0)
Platelets: 154 10*3/uL (ref 150–400)
RBC: 2.85 MIL/uL — ABNORMAL LOW (ref 4.22–5.81)
RDW: 15.2 % (ref 11.5–15.5)
WBC: 11.2 10*3/uL — ABNORMAL HIGH (ref 4.0–10.5)
nRBC: 0 % (ref 0.0–0.2)

## 2022-12-29 LAB — HEMOGLOBIN AND HEMATOCRIT, BLOOD
HCT: 26.3 % — ABNORMAL LOW (ref 39.0–52.0)
HCT: 28.5 % — ABNORMAL LOW (ref 39.0–52.0)
HCT: 26.2 % — ABNORMAL LOW (ref 39.0–52.0)
Hemoglobin: 7.9 g/dL — ABNORMAL LOW (ref 13.0–17.0)
Hemoglobin: 8.3 g/dL — ABNORMAL LOW (ref 13.0–17.0)
Hemoglobin: 7.8 g/dL — ABNORMAL LOW (ref 13.0–17.0)

## 2022-12-29 LAB — COMPREHENSIVE METABOLIC PANEL
ALT: 17 U/L (ref 0–44)
AST: 24 U/L (ref 15–41)
Albumin: 4 g/dL (ref 3.5–5.0)
Alkaline Phosphatase: 41 U/L (ref 38–126)
Anion gap: 12 (ref 5–15)
BUN: 36 mg/dL — ABNORMAL HIGH (ref 8–23)
CO2: 22 mmol/L (ref 22–32)
Calcium: 9 mg/dL (ref 8.9–10.3)
Chloride: 107 mmol/L (ref 98–111)
Creatinine, Ser: 1.5 mg/dL — ABNORMAL HIGH (ref 0.61–1.24)
GFR, Estimated: 43 mL/min — ABNORMAL LOW (ref >60)
Glucose, Bld: 122 mg/dL — ABNORMAL HIGH (ref 70–99)
Potassium: 4.7 mmol/L (ref 3.5–5.1)
Sodium: 141 mmol/L (ref 135–145)
Total Bilirubin: 1.5 mg/dL — ABNORMAL HIGH (ref 0.3–1.2)
Total Protein: 6.7 g/dL (ref 6.5–8.1)

## 2022-12-29 MED ORDER — OXYBUTYNIN CHLORIDE 5 MG PO TABS
10.0000 mg | ORAL_TABLET | Freq: Three times a day (TID) | ORAL | Status: DC | PRN
  Administered 2022-12-29 – 2022-12-30 (×3): 10 mg via ORAL
  Filled 2022-12-29 (×3): qty 2

## 2022-12-29 MED ORDER — AMOXICILLIN 250 MG PO CAPS
500.0000 mg | ORAL_CAPSULE | Freq: Two times a day (BID) | ORAL | Status: DC
  Administered 2022-12-29 – 2022-12-31 (×6): 500 mg via ORAL
  Filled 2022-12-29 (×6): qty 2

## 2022-12-29 MED ORDER — HYDROMORPHONE HCL 1 MG/ML IJ SOLN
0.5000 mg | Freq: Once | INTRAMUSCULAR | Status: AC
  Administered 2022-12-29: 0.5 mg via INTRAVENOUS
  Filled 2022-12-29: qty 0.5

## 2022-12-29 MED ORDER — QUETIAPINE FUMARATE 25 MG PO TABS
12.5000 mg | ORAL_TABLET | Freq: Every day | ORAL | Status: DC
  Administered 2022-12-29: 12.5 mg via ORAL
  Filled 2022-12-29: qty 1

## 2022-12-29 MED ORDER — SODIUM CHLORIDE 0.9 % IR SOLN
3000.0000 mL | Status: DC
  Administered 2022-12-29 (×2): 3000 mL

## 2022-12-29 NOTE — Progress Notes (Signed)
Irrigated catheter with no clots, only slight pink tinge. Turned down CBI at a rate of 1 ggt/5-10 seconds. Reduced paraphimosis. Discussed with nursing to ensure foreskin stays over glans. Discussed with son in the room regarding plan.  Wean CBI as able. Hopefully void trial tomorrow.  [facility]  Pager: [phone]

## 2022-12-29 NOTE — Progress Notes (Signed)
Patient moaning, restless and c/o pain all night, irrigation done 4x with resistance and 1x irrigation with blood clots at 2:00 am, CBI running moderate to fast drip, on call urology and hospitalist made aware.

## 2022-12-29 NOTE — Progress Notes (Addendum)
PROGRESS NOTE    Paul Mays  [id number] DOB: 07/20/1928 DOA: 12/27/2022 PCP: Binnie Rail, MD   Brief Narrative: 89 year old male with past medical history significant for pulmonary hypertension, atrial fibrillation on Eliquis,HTN, HLD, migraine, CAD, gout, CKD stage III, transcatheter aortic valve replacement, and a remote h/o history of prostate cancer.  He is admitted with gross hematuria.  Patient was started on Eliquis on 10/28/2022 for new onset atrial fibrillation. He underwent brachytherapy in 2000 CT renal stone study-12/27/2022-5 cm lobulated density within the bladder, which could represent blood clot or bladder neoplasm. Consider cystoscopy for further evaluation.Tiny nonobstructing left renal calculus. No evidence of ureteral calculi or hydronephrosis.Cholelithiasis. No radiographic evidence of cholecystitis.Large left scrotal hydrocele.Stable small bilateral pleural effusions.   Assessment & Plan:   Principal Problem:   Gross hematuria Active Problems:   Hyperlipidemia   Gout   Essential hypertension   Severe aortic stenosis   OBSTRUCTIVE SLEEP APNEA   Fall   CAD (coronary artery disease)   Prostate cancer ([place])  #1 gross hematuria-in the setting of recently started Eliquis on 10/28/2022 and history of brachytherapy. Patient has been started on continuous bladder irrigation with clearing of urine. No blood clots were noted in the bag today. Eliquis has been discontinued. Patient seen by urology Dr. Abner Greenspan -thinks this is likely from radiation cystitis from prior brachytherapy. Patient has history of localized prostate cancer followed by urology as an outpatient but last seen in 2016. Urine culture growing Enterococcus DC Rocephin starting ampicillin.  #2 new onset atrial fibrillation (CHA2DS2-VASc score of 5 age, CHF, hypertension) since January 2024.  Patient was seen by Dr. Burt Knack started on Eliquis 5 mg twice daily on October 28, 2022. Eliquis was been  stopped due to gross hematuria. Discussed with Dr. Clarita Crane restart him on Eliquis at a lower dose at 2.5 mg twice a day due to age above 57, elevated creatinine once urology feels it is okay to restart.   #3 CAD/status post TAVR bioprosthetic valve February 2023 stable  #4 history of essential hypertension on metoprolol hydralazine  #5 gout on allopurinol  #6 hyperlipidemia on pravastatin  #7 peripheral neuropathy on Neurontin  #8 history of diastolic heart failure on Lasix as needed at home  #9 acute on chronic anemia of chronic disease hemoglobin 8 from 8.9 admitted with gross hematuria will need to monitor closely  #10 AKI -creatinine with some improvement since placing Foley Creatinine 1.43 from 1.81  #11 delirium sundowning-trial of Seroquel tonight  Estimated body mass index is 24.6 kg/m as calculated from the following:   Height as of this encounter: 5\' 4"  (1.626 m).   Weight as of this encounter: 65 kg.  DVT prophylaxis: None due to gross hematuria Code Status: DNR  family Communication: None at bedside will call family Disposition Plan:  Status is: Inpatient Remains inpatient appropriate because: Gross hematuria   Consultants:  Urology  Procedures: Continuous bladder irrigation Antimicrobials: Rocephin  Subjective:  Patient had a rough night Staff reported that he was pulling on tubes very restless Urine culture growing Enterococcus  Objective: Vitals:   12/28/22 2048 12/28/22 2344 12/29/22 0340 12/29/22 1301  BP: (!) 142/72 134/77 128/62 (!) 124/55  Pulse: 82 60 (!) 58 (!) 55  Resp: 20 20 18 18   Temp: 98.4 F (36.9 C) 98.6 F (37 C) 98.3 F (36.8 C) 98 F (36.7 C)  TempSrc: Oral Oral Oral Oral  SpO2: 93% 95% 94% 98%  Weight:      Height:  Intake/Output Summary (Last 24 hours) at 12/29/2022 1408 Last data filed at 12/29/2022 1318 Gross per 24 hour  Intake 14351.08 ml  Output 13550 ml  Net 801.08 ml    Filed Weights   12/27/22  2210  Weight: 65 kg    Examination: CBI in progress Foley bag still with blood-tinged urine  General exam: Appears in no acute distress Respiratory system: Clear to auscultation. Respiratory effort normal. Cardiovascular system: S1 & S2 heard, RRR. No JVD, murmurs, rubs, gallops or clicks. No pedal edema. Gastrointestinal system: Abdomen is nondistended, soft and nontender. No organomegaly or masses felt. Normal bowel sounds heard. Central nervous system: Alert and oriented. No focal neurological deficits. Extremities: 1+ edema  skin: No rashes, lesions or ulcers Psychiatry: Judgement and insight appear normal. Mood & affect appropriate.     Data Reviewed: I have personally reviewed following labs and imaging studies  CBC: Recent Labs  Lab 12/27/22 1340 12/27/22 2226 12/28/22 1006 12/28/22 1625 12/28/22 2209 12/29/22 0354 12/29/22 1036  WBC 6.6  --   --   --   --  11.2*  --   HGB 8.9*   < > 8.4* 8.5* 8.3* 8.2* 7.9*  HCT 28.6*   < > 27.9* 28.1* 27.9* 27.0* 26.3*  MCV 91.7  --   --   --   --  94.7  --   PLT 152  --   --   --   --  154  --    < > = values in this interval not displayed.    Basic Metabolic Panel: Recent Labs  Lab 12/27/22 1340 12/28/22 0356 12/29/22 0354  NA 142 141 141  K 4.7 4.3 4.7  CL 108 110 107  CO2 28 23 22   GLUCOSE 91 97 122*  BUN 46* 44* 36*  CREATININE 1.81* 1.43* 1.50*  CALCIUM 9.6 8.8* 9.0    GFR: Estimated Creatinine Clearance: 25.8 mL/min (A) (by C-G formula based on SCr of 1.5 mg/dL (H)). Liver Function Tests: Recent Labs  Lab 12/29/22 0354  AST 24  ALT 17  ALKPHOS 41  BILITOT 1.5*  PROT 6.7  ALBUMIN 4.0   No results for input(s): "LIPASE", "AMYLASE" in the last 168 hours. No results for input(s): "AMMONIA" in the last 168 hours. Coagulation Profile: No results for input(s): "INR", "PROTIME" in the last 168 hours. Cardiac Enzymes: No results for input(s): "CKTOTAL", "CKMB", "CKMBINDEX", "TROPONINI" in the last 168  hours. BNP (last 3 results) No results for input(s): "PROBNP" in the last 8760 hours. HbA1C: No results for input(s): "HGBA1C" in the last 72 hours. CBG: No results for input(s): "GLUCAP" in the last 168 hours. Lipid Profile: No results for input(s): "CHOL", "HDL", "LDLCALC", "TRIG", "CHOLHDL", "LDLDIRECT" in the last 72 hours. Thyroid Function Tests: No results for input(s): "TSH", "T4TOTAL", "FREET4", "T3FREE", "THYROIDAB" in the last 72 hours. Anemia Panel: No results for input(s): "VITAMINB12", "FOLATE", "FERRITIN", "TIBC", "IRON", "RETICCTPCT" in the last 72 hours. Sepsis Labs: No results for input(s): "PROCALCITON", "LATICACIDVEN" in the last 168 hours.  Recent Results (from the past 240 hour(s))  Urine Culture     Status: Abnormal (Preliminary result)   Collection Time: 12/27/22  4:27 PM   Specimen: Urine, Clean Catch  Result Value Ref Range Status   Specimen Description   Final    URINE, CLEAN CATCH Performed at [facility], [address], [place], [place] [address]    Special Requests   Final    NONE Performed at [facility], [place], [place] [address]    Culture 60,000 COLONIES/mL ENTEROCOCCUS FAECALIS (A)  Final   Report Status PENDING  Incomplete         Radiology Studies: CT Renal Stone Study  Result Date: 12/27/2022 CLINICAL DATA:  Hematuria and dysuria.  Urinary retention. EXAM: CT ABDOMEN AND PELVIS WITHOUT CONTRAST TECHNIQUE: Multidetector CT imaging of the abdomen and pelvis was performed following the standard protocol without IV contrast. RADIATION DOSE REDUCTION: This exam was performed according to the departmental dose-optimization program which includes automated exposure control, adjustment of the mA and/or kV according to patient size and/or use of iterative reconstruction technique. COMPARISON:  03/02/2021 FINDINGS: Lower chest: Small bilateral pleural effusions without significant change.  Stable cardiomegaly. Hepatobiliary: No mass visualized on this unenhanced exam. Fluid attenuation cyst again noted in the left hepatic lobe. Tiny calcified gallstones are seen, however there is no evidence of cholecystitis or biliary ductal dilatation. Pancreas: No mass or inflammatory process visualized on this unenhanced exam. Spleen:  Within normal limits in size. Adrenals/Urinary tract: 2 mm calculus noted in lower pole of left kidney. No evidence of ureteral calculi or hydronephrosis. Lobulated density is seen within the bladder lumen along the posterior wall which measures 5.1 x 3.0 cm. This has attenuation value of 42 Hounsfield units. This could represent blood clot or bladder neoplasm. Stomach/Bowel: No evidence of obstruction, inflammatory process, or abnormal fluid collections. Vascular/Lymphatic: No pathologically enlarged lymph nodes identified. No evidence of abdominal aortic aneurysm. Aortic atherosclerotic calcification incidentally noted. Reproductive:  Brachytherapy seeds seen throughout prostate gland. Other:  Large left scrotal hydrocele is again seen. Musculoskeletal:  No suspicious bone lesions identified. IMPRESSION: 5 cm lobulated density within the bladder, which could represent blood clot or bladder neoplasm. Consider cystoscopy for further evaluation. Tiny nonobstructing left renal calculus. No evidence of ureteral calculi or hydronephrosis. Cholelithiasis. No radiographic evidence of cholecystitis. Large left scrotal hydrocele. Stable small bilateral pleural effusions. Aortic Atherosclerosis (ICD10-I70.0). Electronically Signed   By: Marlaine Hind M.D.   On: 12/27/2022 15:31        Scheduled Meds:  amoxicillin  500 mg Oral Q12H   Chlorhexidine Gluconate Cloth  6 each Topical Daily   gabapentin  600 mg Oral QHS   hydrALAZINE  25 mg Oral BID   metoprolol tartrate  25 mg Oral BID   Continuous Infusions:  lactated ringers 75 mL/hr at 12/29/22 0638   sodium chloride irrigation        LOS: 2 days    Time spent: 39 min  Georgette Shell, MD  12/29/2022, 2:08 PM

## 2022-12-29 NOTE — Plan of Care (Signed)

## 2022-12-29 NOTE — Progress Notes (Signed)
Attempted to place 20 Fr. Foley per MD request but unable to advance catheter balloon past meatus. Notified MD about unsuccessful attempt.

## 2022-12-29 NOTE — Progress Notes (Addendum)
Subjective: Delirious overnight. Rns had to irrigate for small clots at 2AM. Urine clear see through this AM on slow drip. Capped and still clear, catheter removed but due to tight meatus, had some blood at the tip of the meatus.  Objective: Vital signs in last 24 hours: Temp:  [97.6 F (36.4 C)-98.6 F (37 C)] 98.3 F (36.8 C) (03/25 0340) Pulse Rate:  [41-82] 58 (03/25 0340) Resp:  [18-20] 18 (03/25 0340) BP: (125-146)/(57-77) 128/62 (03/25 0340) SpO2:  [93 %-95 %] 94 % (03/25 0340)  Intake/Output from previous day: 03/24 0701 - 03/25 0700 In: 20711.1 [P.O.:720; I.V.:1891.1; IV Piggyback:100] Out: [id number] [Urine:15250] Intake/Output this shift: No intake/output data recorded.  Physical Exam:  General: Alert and oriented CV: RRR Lungs: Clear Abdomen: Soft, ND, NT Ext: NT, No erythema  Lab Results: Recent Labs    12/28/22 1625 12/28/22 2209 12/29/22 0354  HGB 8.5* 8.3* 8.2*  HCT 28.1* 27.9* 27.0*   BMET Recent Labs    12/28/22 0356 12/29/22 0354  NA 141 141  K 4.3 4.7  CL 110 107  CO2 23 22  GLUCOSE 97 122*  BUN 44* 36*  CREATININE 1.43* 1.50*  CALCIUM 8.8* 9.0     Studies/Results: CT Renal Stone Study  Result Date: 12/27/2022 CLINICAL DATA:  Hematuria and dysuria.  Urinary retention. EXAM: CT ABDOMEN AND PELVIS WITHOUT CONTRAST TECHNIQUE: Multidetector CT imaging of the abdomen and pelvis was performed following the standard protocol without IV contrast. RADIATION DOSE REDUCTION: This exam was performed according to the departmental dose-optimization program which includes automated exposure control, adjustment of the mA and/or kV according to patient size and/or use of iterative reconstruction technique. COMPARISON:  03/02/2021 FINDINGS: Lower chest: Small bilateral pleural effusions without significant change. Stable cardiomegaly. Hepatobiliary: No mass visualized on this unenhanced exam. Fluid attenuation cyst again noted in the left hepatic lobe. Tiny  calcified gallstones are seen, however there is no evidence of cholecystitis or biliary ductal dilatation. Pancreas: No mass or inflammatory process visualized on this unenhanced exam. Spleen:  Within normal limits in size. Adrenals/Urinary tract: 2 mm calculus noted in lower pole of left kidney. No evidence of ureteral calculi or hydronephrosis. Lobulated density is seen within the bladder lumen along the posterior wall which measures 5.1 x 3.0 cm. This has attenuation value of 42 Hounsfield units. This could represent blood clot or bladder neoplasm. Stomach/Bowel: No evidence of obstruction, inflammatory process, or abnormal fluid collections. Vascular/Lymphatic: No pathologically enlarged lymph nodes identified. No evidence of abdominal aortic aneurysm. Aortic atherosclerotic calcification incidentally noted. Reproductive:  Brachytherapy seeds seen throughout prostate gland. Other:  Large left scrotal hydrocele is again seen. Musculoskeletal:  No suspicious bone lesions identified. IMPRESSION: 5 cm lobulated density within the bladder, which could represent blood clot or bladder neoplasm. Consider cystoscopy for further evaluation. Tiny nonobstructing left renal calculus. No evidence of ureteral calculi or hydronephrosis. Cholelithiasis. No radiographic evidence of cholecystitis. Large left scrotal hydrocele. Stable small bilateral pleural effusions. Aortic Atherosclerosis (ICD10-I70.0). Electronically Signed   By: Marlaine Hind M.D.   On: 12/27/2022 15:31    Assessment/Plan: #1.  Gross hematuria: Etiology likely related to radiation cystitis given history of prior brachytherapy.  Will need full outpatient evaluation to rule out underlying bladder mass.   #2.  Prostate cancer: Localized prostate cancer treated with brachytherapy in 2000 with PSA nadir at 0.1 last followed in 2016.   -Urine clear on slow drip and capped. -Removed 24 Fr 3 way with some difficulty as his meatus and  fossa navicularis was tight  and this caused minor bleeding. -Recommend replacing 20 Fr 3 way for now to allow for better compression. He will tolerate this better as smaller caliber. Discussed with nursing. -Restart CBI if needed -Will need outpatient cystoscopy and CT hematuria protocol for full evaluation. Messaged schedulers to arrange. -Following   LOS: 2 days   Matt R. Deloyce Walthers MD 12/29/2022, 7:30 AM [facility]  Pager: [phone]

## 2022-12-29 NOTE — Procedures (Addendum)
Foley Catheter Placement Note  Indications: 89 y.o. male admitted with gross hematuria requiring CBI over the weekend. Catheter removed this morning and found to have persistent bleeding at meatus and retention.  Surgeon: Josph Macho, MD  Assistants: None  Procedure Details  Patient was placed in the supine position, prepped with Betadine and draped in the usual sterile fashion.  We injected lidocaine jelly per urethra prior to the procedure.  We then attempted to insert a 20Fr 3 way catheter per urethra but the meatus was tight. Using van buren sounds, the meatus was dilated to 28Fr. I then inserted a 20 [place] 3 way catheter per urethra which passed into the bladder with some resistance in the anterior urethra.  We achieved return of merlot colored urine and then proceeded to insert 30 mL of sterile water into the Foley balloon.  The catheter was flushed and then attached to a drainage bag and secured with a StatLock with CBI restarted.               Complications: None; patient tolerated the procedure well.  Plan:   1.  Continue Foley catheter to drainage with CBI titrated to light pink. 2. Please see same day consult note for remainder of plan  Attending Attestation: Dr. Abner Greenspan was available.   Coralyn Pear, MD [facility]

## 2022-12-30 DIAGNOSIS — R310 Gross hematuria: Secondary | ICD-10-CM | POA: Diagnosis not present

## 2022-12-30 LAB — HEMOGLOBIN AND HEMATOCRIT, BLOOD
HCT: 25 % — ABNORMAL LOW (ref 39.0–52.0)
HCT: 30.6 % — ABNORMAL LOW (ref 39.0–52.0)
HCT: 28.7 % — ABNORMAL LOW (ref 39.0–52.0)
Hemoglobin: 7.5 g/dL — ABNORMAL LOW (ref 13.0–17.0)
Hemoglobin: 9.5 g/dL — ABNORMAL LOW (ref 13.0–17.0)
Hemoglobin: 8.8 g/dL — ABNORMAL LOW (ref 13.0–17.0)

## 2022-12-30 LAB — URINE CULTURE: Culture: 60000 — AB

## 2022-12-30 LAB — PREPARE RBC (CROSSMATCH)

## 2022-12-30 MED ORDER — SODIUM CHLORIDE 0.9% IV SOLUTION: Freq: Once | INTRAVENOUS | Status: AC

## 2022-12-30 MED ORDER — APIXABAN 2.5 MG PO TABS
2.5000 mg | ORAL_TABLET | Freq: Two times a day (BID) | ORAL | Status: DC
  Administered 2022-12-30 – 2023-01-04 (×11): 2.5 mg via ORAL
  Filled 2022-12-30 (×11): qty 1

## 2022-12-30 MED ORDER — QUETIAPINE FUMARATE 25 MG PO TABS
25.0000 mg | ORAL_TABLET | Freq: Every day | ORAL | Status: DC
  Administered 2022-12-30 – 2023-01-03 (×5): 25 mg via ORAL
  Filled 2022-12-30 (×5): qty 1

## 2022-12-30 NOTE — Plan of Care (Signed)

## 2022-12-30 NOTE — Progress Notes (Signed)
PROGRESS NOTE    IBHAN MARKUNAS  [id number] DOB: Apr 13, 1928 DOA: 12/27/2022 PCP: Binnie Rail, MD   Brief Narrative: 89 year old male with past medical history significant for pulmonary hypertension, atrial fibrillation on Eliquis,HTN, HLD, migraine, CAD, gout, CKD stage III, transcatheter aortic valve replacement, and a remote h/o history of prostate cancer.  He is admitted with gross hematuria.  Patient was started on Eliquis on 10/28/2022 for new onset atrial fibrillation. He underwent brachytherapy in 2000 CT renal stone study-12/27/2022-5 cm lobulated density within the bladder, which could represent blood clot or bladder neoplasm. Consider cystoscopy for further evaluation.Tiny nonobstructing left renal calculus. No evidence of ureteral calculi or hydronephrosis.Cholelithiasis. No radiographic evidence of cholecystitis.Large left scrotal hydrocele.Stable small bilateral pleural effusions.   Assessment & Plan:   Principal Problem:   Gross hematuria Active Problems:   Hyperlipidemia   Gout   Essential hypertension   Severe aortic stenosis   OBSTRUCTIVE SLEEP APNEA   Fall   CAD (coronary artery disease)   Prostate cancer ([place])  #1 gross hematuria-in the setting of recently started Eliquis on 10/28/2022 and history of brachytherapy. Patient has been started on continuous bladder irrigation with clearing of urine. No blood clots were noted in the bag today. Eliquis had been discontinued.  Will start Eliquis at a lower dose 2.5 mg twice a day on 12/30/2022.  Monitor closely for any further bleed. Patient seen by urology Dr. Abner Greenspan -thinks this is likely from radiation cystitis from prior brachytherapy. Patient has history of localized prostate cancer followed by urology as an outpatient but last seen in 2016. Urine culture growing Enterococcus DC Rocephin starting ampicillin 12/29/2022.  #2 new onset atrial fibrillation (CHA2DS2-VASc score of 5 age, CHF, hypertension) since January  2024.  Patient was seen by Dr. Burt Knack started on Eliquis 5 mg twice daily on October 28, 2022. Eliquis was been stopped due to gross hematuria. Discussed with Dr. Clarita Crane restart him on Eliquis at a lower dose at 2.5 mg twice a day due to age above 18, elevated creatinine once urology feels it is okay to restart.   #3 CAD/status post TAVR bioprosthetic valve February 2023 stable  #4 history of essential hypertension on metoprolol hydralazine  #5 gout on allopurinol  #6 hyperlipidemia on pravastatin  #7 peripheral neuropathy on Neurontin  #8 history of diastolic heart failure on Lasix as needed at home  #9 acute on chronic anemia of chronic disease -hemoglobin 7.5  from 8.9 on admission.  Patient was admitted with gross hematuria.  Will check FOBT.  Will transfuse 1 unit of packed RBC.  Discussed with his son.    #10 AKI -creatinine with some improvement since placing Foley Creatinine 1.50 from 1.81  #11 delirium sundowning-continue Seroquel  Estimated body mass index is 24.6 kg/m as calculated from the following:   Height as of this encounter: 5\' 4"  (1.626 m).   Weight as of this encounter: 65 kg.  DVT prophylaxis: None due to gross hematuria Code Status: DNR  family Communication: Discussed with son  Disposition Plan:  Status is: Inpatient Remains inpatient appropriate because: Gross hematuria   Consultants:  Urology  Procedures: Continuous bladder irrigation Antimicrobials: Rocephin  Subjective: He had a better night than the night before even though he was still pulling on lines Objective: Vitals:   12/30/22 1104 12/30/22 1119 12/30/22 1357 12/30/22 1411  BP: 113/64 (!) 122/58 (!) 142/79 137/70  Pulse: 62 60 (!) 56 (!) 59  Resp:  18 18 12   Temp: 98.4 F (  36.9 C) 98 F (36.7 C) 97.9 F (36.6 C) 98 F (36.7 C)  TempSrc: Oral Oral Oral Oral  SpO2: 97% 94% 96% 97%  Weight:      Height:        Intake/Output Summary (Last 24 hours) at 12/30/2022 1419 Last  data filed at 12/30/2022 1411 Gross per 24 hour  Intake 4260.03 ml  Output 10875 ml  Net -6614.97 ml    Filed Weights   12/27/22 2210  Weight: 65 kg    Examination:  Foley with clear urine General exam: Appears confused and delirious Respiratory system: Clear to auscultation. Respiratory effort normal. Cardiovascular system: S1 & S2 heard, RRR. No JVD, murmurs, rubs, gallops or clicks. No pedal edema. Gastrointestinal system: Abdomen is nondistended, soft and nontender. No organomegaly or masses felt. Normal bowel sounds heard. Central nervous system: Alert and oriented. No focal neurological deficits. Extremities: 1+ edema  skin: No rashes, lesions or ulcers Psychiatry: Judgement and insight appear normal. Mood & affect appropriate.     Data Reviewed: I have personally reviewed following labs and imaging studies  CBC: Recent Labs  Lab 12/27/22 1340 12/27/22 2226 12/29/22 0354 12/29/22 1036 12/29/22 1631 12/29/22 2249 12/30/22 0437  WBC 6.6  --  11.2*  --   --   --   --   HGB 8.9*   < > 8.2* 7.9* 8.3* 7.8* 7.5*  HCT 28.6*   < > 27.0* 26.3* 28.5* 26.2* 25.0*  MCV 91.7  --  94.7  --   --   --   --   PLT 152  --  154  --   --   --   --    < > = values in this interval not displayed.    Basic Metabolic Panel: Recent Labs  Lab 12/27/22 1340 12/28/22 0356 12/29/22 0354  NA 142 141 141  K 4.7 4.3 4.7  CL 108 110 107  CO2 28 23 22   GLUCOSE 91 97 122*  BUN 46* 44* 36*  CREATININE 1.81* 1.43* 1.50*  CALCIUM 9.6 8.8* 9.0    GFR: Estimated Creatinine Clearance: 25.8 mL/min (A) (by C-G formula based on SCr of 1.5 mg/dL (H)). Liver Function Tests: Recent Labs  Lab 12/29/22 0354  AST 24  ALT 17  ALKPHOS 41  BILITOT 1.5*  PROT 6.7  ALBUMIN 4.0    No results for input(s): "LIPASE", "AMYLASE" in the last 168 hours. No results for input(s): "AMMONIA" in the last 168 hours. Coagulation Profile: No results for input(s): "INR", "PROTIME" in the last 168  hours. Cardiac Enzymes: No results for input(s): "CKTOTAL", "CKMB", "CKMBINDEX", "TROPONINI" in the last 168 hours. BNP (last 3 results) No results for input(s): "PROBNP" in the last 8760 hours. HbA1C: No results for input(s): "HGBA1C" in the last 72 hours. CBG: No results for input(s): "GLUCAP" in the last 168 hours. Lipid Profile: No results for input(s): "CHOL", "HDL", "LDLCALC", "TRIG", "CHOLHDL", "LDLDIRECT" in the last 72 hours. Thyroid Function Tests: No results for input(s): "TSH", "T4TOTAL", "FREET4", "T3FREE", "THYROIDAB" in the last 72 hours. Anemia Panel: No results for input(s): "VITAMINB12", "FOLATE", "FERRITIN", "TIBC", "IRON", "RETICCTPCT" in the last 72 hours. Sepsis Labs: No results for input(s): "PROCALCITON", "LATICACIDVEN" in the last 168 hours.  Recent Results (from the past 240 hour(s))  Urine Culture     Status: Abnormal   Collection Time: 12/27/22  4:27 PM   Specimen: Urine, Clean Catch  Result Value Ref Range Status   Specimen Description   Final  URINE, CLEAN CATCH Performed at [facility], [address], [place], [place] [address]    Special Requests   Final    NONE Performed at [facility], [place], [place] [address]    Culture 60,000 COLONIES/mL ENTEROCOCCUS FAECALIS (A)  Final   Report Status 12/30/2022 FINAL  Final   Organism ID, Bacteria ENTEROCOCCUS FAECALIS (A)  Final      Susceptibility   Enterococcus faecalis - MIC*    AMPICILLIN <=2 SENSITIVE Sensitive     NITROFURANTOIN <=16 SENSITIVE Sensitive     VANCOMYCIN 1 SENSITIVE Sensitive     * 60,000 COLONIES/mL ENTEROCOCCUS FAECALIS         Radiology Studies: No results found.      Scheduled Meds:  amoxicillin  500 mg Oral Q12H   apixaban  2.5 mg Oral BID   Chlorhexidine Gluconate Cloth  6 each Topical Daily   gabapentin  600 mg Oral QHS   hydrALAZINE  25 mg Oral BID   metoprolol tartrate  25 mg Oral BID    QUEtiapine  12.5 mg Oral QHS   Continuous Infusions:  lactated ringers 75 mL/hr at 12/29/22 1818   sodium chloride irrigation       LOS: 3 days    Time spent: 39 min  Georgette Shell, MD  12/30/2022, 2:19 PM

## 2022-12-30 NOTE — Progress Notes (Signed)
  Subjective: Resting comfortably, in restraints.  Objective: Vital signs in last 24 hours: Temp:  [98 F (36.7 C)-98.9 F (37.2 C)] 98.9 F (37.2 C) (03/26 0403) Pulse Rate:  [55-61] 61 (03/25 2044) Resp:  [16-18] 16 (03/26 0403) BP: (124-141)/(55-77) 137/77 (03/26 0403) SpO2:  [93 %-98 %] 96 % (03/26 0403)  Intake/Output from previous day: 03/25 0701 - 03/26 0700 In: 3870.5 [I.V.:870.5] Out: [id number] [Urine:11825] Intake/Output this shift: Total I/O In: -  Out: 1450 [Urine:1450]  Physical Exam:  General: Alert and oriented CV: RRR Lungs: Clear Abdomen: Soft, ND, NT Ext: NT, No erythema  Lab Results: Recent Labs    12/29/22 1631 12/29/22 2249 12/30/22 0437  HGB 8.3* 7.8* 7.5*  HCT 28.5* 26.2* 25.0*   BMET Recent Labs    12/28/22 0356 12/29/22 0354  NA 141 141  K 4.3 4.7  CL 110 107  CO2 23 22  GLUCOSE 97 122*  BUN 44* 36*  CREATININE 1.43* 1.50*  CALCIUM 8.8* 9.0     Studies/Results: No results found.  Assessment/Plan: #1.  Gross hematuria: Etiology likely related to radiation cystitis given history of prior brachytherapy.  Will need full outpatient evaluation to rule out underlying bladder mass.   #2.  Prostate cancer: Localized prostate cancer treated with brachytherapy in 2000 with PSA nadir at 0.1 last followed in 2016.   -Urine clear on slow drip and capped. -Favor keeping 20 Fr 3 way catheter in place. Void trial outpatient. -Will need outpatient cystoscopy and CT hematuria protocol for full evaluation. Messaged schedulers to arrange. -If urine stays clear, appropriate to discharge home with foley from GU standpoint -Following   LOS: 3 days   Matt R. Ardel Jagger MD 12/30/2022, 7:47 AM [facility]  Pager: [phone]

## 2022-12-31 DIAGNOSIS — D649 Anemia, unspecified: Secondary | ICD-10-CM

## 2022-12-31 DIAGNOSIS — N3001 Acute cystitis with hematuria: Secondary | ICD-10-CM

## 2022-12-31 DIAGNOSIS — I25810 Atherosclerosis of coronary artery bypass graft(s) without angina pectoris: Secondary | ICD-10-CM | POA: Diagnosis not present

## 2022-12-31 DIAGNOSIS — G4733 Obstructive sleep apnea (adult) (pediatric): Secondary | ICD-10-CM | POA: Diagnosis not present

## 2022-12-31 DIAGNOSIS — M109 Gout, unspecified: Secondary | ICD-10-CM

## 2022-12-31 DIAGNOSIS — I10 Essential (primary) hypertension: Secondary | ICD-10-CM | POA: Diagnosis not present

## 2022-12-31 DIAGNOSIS — I5032 Chronic diastolic (congestive) heart failure: Secondary | ICD-10-CM

## 2022-12-31 DIAGNOSIS — I480 Paroxysmal atrial fibrillation: Secondary | ICD-10-CM

## 2022-12-31 LAB — BPAM RBC
Blood Product Expiration Date: 202404232359
ISSUE DATE / TIME: 202403261056
Unit Type and Rh: 600

## 2022-12-31 LAB — TYPE AND SCREEN
ABO/RH(D): A NEG
Antibody Screen: NEGATIVE
Unit division: 0

## 2022-12-31 LAB — BASIC METABOLIC PANEL
Anion gap: 8 (ref 5–15)
BUN: 30 mg/dL — ABNORMAL HIGH (ref 8–23)
CO2: 23 mmol/L (ref 22–32)
Calcium: 8.6 mg/dL — ABNORMAL LOW (ref 8.9–10.3)
Chloride: 110 mmol/L (ref 98–111)
Creatinine, Ser: 1.15 mg/dL (ref 0.61–1.24)
GFR, Estimated: 59 mL/min — ABNORMAL LOW (ref >60)
Glucose, Bld: 83 mg/dL (ref 70–99)
Potassium: 4.1 mmol/L (ref 3.5–5.1)
Sodium: 141 mmol/L (ref 135–145)

## 2022-12-31 LAB — HEMOGLOBIN AND HEMATOCRIT, BLOOD
HCT: 27.2 % — ABNORMAL LOW (ref 39.0–52.0)
HCT: 28.2 % — ABNORMAL LOW (ref 39.0–52.0)
HCT: 30.8 % — ABNORMAL LOW (ref 39.0–52.0)
Hemoglobin: 8.4 g/dL — ABNORMAL LOW (ref 13.0–17.0)
Hemoglobin: 8.8 g/dL — ABNORMAL LOW (ref 13.0–17.0)
Hemoglobin: 9.2 g/dL — ABNORMAL LOW (ref 13.0–17.0)

## 2022-12-31 LAB — MAGNESIUM: Magnesium: 2.3 mg/dL (ref 1.7–2.4)

## 2022-12-31 NOTE — Evaluation (Signed)
Occupational Therapy Evaluation Patient Details Name: Paul Mays MRN: [id number] DOB: 1928-07-02 Today's Date: 12/31/2022   History of Present Illness Patient is a 89 year old male who presented with burning sensation with urination and urinating blood with recent start of blood thinners. Patient was admitted with gross hematuria and new onset of a fib. PMH: CAD, hyperlipidemia, gout, diastolic heart failure, acute on chronic anemia of chronic disease.   Clinical Impression   Patient is a 89 year old male who was admitted for above. Patient reported living at home with wife and support from two sons. Patient reported he was able to complete ADLs himself prior to admission but sons would be able to help 24/7 at home if needed at time of d/c. Patient was mod A for transfers with noted instability with attempting to advance to recliner in room with decreased safety awareness and decreased functional activity tolerance impacting participation in ADLs. Patient would continue to benefit from skilled OT services at this time while admitted and after d/c to address noted deficits in order to improve overall safety and independence in ADLs.        Recommendations for follow up therapy are one component of a multi-disciplinary discharge planning process, led by the attending physician.  Recommendations may be updated based on patient status, additional functional criteria and insurance authorization.   Assistance Recommended at Discharge Frequent or constant Supervision/Assistance  Patient can return home with the following A lot of help with bathing/dressing/bathroom;A lot of help with walking and/or transfers;Direct supervision/assist for financial management;Help with stairs or ramp for entrance;Assist for transportation;Direct supervision/assist for medications management;Assistance with cooking/housework    Functional Status Assessment  Patient has had a recent decline in their functional status  and demonstrates the ability to make significant improvements in function in a reasonable and predictable amount of time.  Equipment Recommendations  None recommended by OT    Recommendations for Other Services       Precautions / Restrictions Precautions Precautions: Fall Restrictions Weight Bearing Restrictions: No      Mobility Bed Mobility Overal bed mobility: Needs Assistance Bed Mobility: Supine to Sit     Supine to sit: Mod assist     General bed mobility comments: with cues for proper positioning and sequencing of task                ADL either performed or assessed with clinical judgement   ADL Overall ADL's : Needs assistance/impaired Eating/Feeding: Set up;Sitting   Grooming: Set up;Wash/dry face;Sitting Grooming Details (indicate cue type and reason): EOB Upper Body Bathing: Minimal assistance;Sitting   Lower Body Bathing: Maximal assistance;Sitting/lateral leans;Sit to/from stand Lower Body Bathing Details (indicate cue type and reason): unable to bring foot to lap Upper Body Dressing : Minimal assistance;Sitting   Lower Body Dressing: Total assistance;Sitting/lateral leans;Sit to/from stand   Toilet Transfer: Moderate assistance;Stand-pivot Toilet Transfer Details (indicate cue type and reason): patient declined to use RW with patient noted to be unsteady with attempted steps to get to recliner in room. patient was noted to reach out to hold onto IV pole for stability instead of the recliner as per instructions. patient was uanble to maintain upright posture when weight shifting feet. patient in static standing balance was noted to have good posture with BUE support. nurse educated on patient needing +2 for safety to get back to bed. nurse verbalized understanding. Toileting- Clothing Manipulation and Hygiene: Total assistance;Sit to/from stand;Sitting/lateral lean Toileting - Clothing Manipulation Details (indicate cue type and  reason): patient  reported increased desire to pee with increased pressure in suprapubic area. nurse made aware             Vision   Vision Assessment?: No apparent visual deficits            Pertinent Vitals/Pain Pain Assessment Pain Assessment: No/denies pain     Hand Dominance Left   Extremity/Trunk Assessment Upper Extremity Assessment Upper Extremity Assessment: Overall WFL for tasks assessed (audible crepitus in shoulders bilaterally with all movements)   Lower Extremity Assessment Lower Extremity Assessment: Defer to PT evaluation   Cervical / Trunk Assessment Cervical / Trunk Assessment: Kyphotic   Communication Communication Communication: No difficulties   Cognition Arousal/Alertness: Awake/alert Behavior During Therapy: WFL for tasks assessed/performed Overall Cognitive Status: No family/caregiver present to determine baseline cognitive functioning           General Comments: patient reported today was thursday november 23rd 2024.                Home Living Family/patient expects to be discharged to:: Private residence Living Arrangements: Spouse/significant other Available Help at Discharge: Family;Available 24 hours/day Type of Home: House                       Home Equipment: Conservation officer, nature (2 wheels);Cane - single point;BSC/3in1;Shower seat          Prior Functioning/Environment Prior Level of Function : Needs assist               ADLs Comments: patient reported normally being able to particpate in ADLs himself but has strong family support if needed at home at time of d/c.        OT Problem List: Impaired balance (sitting and/or standing);Decreased coordination;Decreased safety awareness;Decreased knowledge of precautions;Decreased knowledge of use of DME or AE      OT Treatment/Interventions: Self-care/ADL training;Energy conservation;Therapeutic exercise;DME and/or AE instruction;Therapeutic activities;Patient/family  education;Balance training    OT Goals(Current goals can be found in the care plan section) Acute Rehab OT Goals Patient Stated Goal: to get back home OT Goal Formulation: With patient Time For Goal Achievement: 01/14/23 Potential to Achieve Goals: Fair  OT Frequency: Min 2X/week       AM-PAC OT "6 Clicks" Daily Activity     Outcome Measure Help from another person eating meals?: A Little Help from another person taking care of personal grooming?: A Little Help from another person toileting, which includes using toliet, bedpan, or urinal?: A Lot Help from another person bathing (including washing, rinsing, drying)?: A Lot Help from another person to put on and taking off regular upper body clothing?: A Little Help from another person to put on and taking off regular lower body clothing?: A Lot 6 Click Score: 15   End of Session Nurse Communication: Other (comment) (patients nurse asked for therapist to leave posy belt in place on patient but not connected to recliner at this time with chair alarm in place)  Activity Tolerance: Patient tolerated treatment well Patient left: in chair;with call bell/phone within reach;with chair alarm set  OT Visit Diagnosis: Unsteadiness on feet (R26.81);Other abnormalities of gait and mobility (R26.89);Muscle weakness (generalized) (M62.81);Pain                Time: [id number] OT Time Calculation (min): 23 min Charges:  OT General Charges $OT Visit: 1 Visit OT Evaluation $OT Eval Moderate Complexity: 1 Mod OT Treatments $Self Care/Home Management : 8-22 mins  Borden Thune OTR/L, MS  Acute Rehabilitation Department Office# [phone]   Willa Rough 12/31/2022, 4:13 PM

## 2022-12-31 NOTE — Progress Notes (Signed)
Mobility Specialist - Progress Note   12/31/22 1408  Mobility  Activity Transferred from chair to bed  Level of Assistance Contact guard assist, steadying assist  Assistive Device Front wheel walker  Distance Ambulated (ft) 2 ft  Activity Response Tolerated well  Mobility Referral Yes  $Mobility charge 1 Mobility   Pt received in recliner & agreeable to transfer to recliner w/ help of nurse. No complaints during transfer. Pt to bed after session with all needs met & NT in room.   [facility]

## 2022-12-31 NOTE — Progress Notes (Addendum)
PROGRESS NOTE    Paul Mays  [id number] DOB: 23-Apr-1928 DOA: 12/27/2022 PCP: Binnie Rail, MD    Chief Complaint  Patient presents with   Hematuria    Brief Narrative:  89 year old male with past medical history significant for pulmonary hypertension, atrial fibrillation on Eliquis,HTN, HLD, migraine, CAD, gout, CKD stage III, transcatheter aortic valve replacement, and a remote h/o history of prostate cancer.  He is admitted with gross hematuria.  Patient was started on Eliquis on 10/28/2022 for new onset atrial fibrillation. He underwent brachytherapy in 2000 CT renal stone study-12/27/2022-5 cm lobulated density within the bladder, which could represent blood clot or bladder neoplasm. Consider cystoscopy for further evaluation.Tiny nonobstructing left renal calculus. No evidence of ureteral calculi or hydronephrosis.Cholelithiasis. No radiographic evidence of cholecystitis.Large left scrotal hydrocele.Stable small bilateral pleural effusions. Patient seen in consultation by urology, CBI is done, improvement with hematuria.   Assessment & Plan:   Principal Problem:   Gross hematuria Active Problems:   Hyperlipidemia   Gout   Essential hypertension   Severe aortic stenosis   OBSTRUCTIVE SLEEP APNEA   Fall   CAD (coronary artery disease)   Prostate cancer (HCC)   Acute cystitis with hematuria   Paroxysmal atrial fibrillation (HCC)   Chronic diastolic CHF (congestive heart failure) ([place])  #1 gross hematuria/Enterococcus UTI -Patient was then with gross hematuria in the setting of recently started Eliquis 10/28/2022 and history of brachytherapy. -Patient seen in consultation by urology and patient started on continuous bladder irrigation with improvement with hematuria. -Eliquis initially discontinued and resumed at a lower dose of 2.5 mg twice daily on 12/30/2022. -CBI is discontinued today. -Patient seen in consultation by urology, Dr. 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 -Patient with history of localized prostate cancer followed by urology in the outpatient setting last seen in 2016. -Urine cultures with Enterococcus, IV Rocephin discontinued and patient started on amoxicillin on 12/29/2022. -Urology recommended continuation of Foley catheter with voiding trial to be done in the outpatient setting, will need further outpatient evaluation to rule out underlying bladder mass. -Urology following and appreciate input and recommendations.  2.  New onset A-fib Cha2ds2vasc score 5.  Newly diagnosed in January 2024. -Patient initially seen by cardiology started on Eliquis 5 mg twice daily on 10/28/2022. -Dr. Zigmund  discussed with cardiology, Dr. Acie Fredrickson and it was recommended that patient may be restarted at a lower dose of Eliquis 2.5 mg twice daily due to age above 65, elevated creatinine once cleared by urology. -Outpatient follow-up.  3.  CAD/status post TAVR bioprosthetic valve February 2023 -Stable. -Outpatient follow-up.  4.  Hypertension -Continue Lopressor, hydralazine.  5.  Gout -Allopurinol.  6.  Hyperlipidemia -Statin.  7.  Peripheral neuropathy -Continue Neurontin.  8.  History of diastolic CHF -Stable. -Currently euvolemic. -Continue Lopressor, hydralazine.  Lasix as needed.  9.  AKI -Status post Foley catheter placement, with improvement with renal function, creatinine currently at 1.15 this morning from as high as 1.81 on 12/27/2022. -Repeat labs in the AM.  10.  Acute on chronic anemia of chronic disease -Hemoglobin noted as low as 7.5 from 8.9 on admission.  Likely secondary to gross hematuria. -Status post transfusion 1 unit packed red blood cells hemoglobin currently at 9.2. -Repeat labs in the AM.  11.  Delirium/sundowning -Seroquel.    DVT prophylaxis: Eliquis Code Status: DNR Family Communication: Updated patient.  Updated son Paul Mays on the telephone. Disposition: Likely  home with home health in the  next 24 hours if continued clinical improvement, no further gross hematuria, cleared by urology.  Status is: Inpatient Remains inpatient appropriate because: Severity of illness   Consultants:  Urology: Dr. Abner Greenspan 12/27/2022  Procedures:  CT renal stone protocol 12/27/2022 Transfusion 1 unit packed red blood cells 12/30/2022  Antimicrobials:  Amoxicillin 12/29/2022>>>> IV Rocephin 12/27/2022>>> 12/29/2022   Subjective: Patient laying in bed.  Foley catheter with clear amber-colored urine.  No chest pain.  No shortness of breath.  No abdominal pain.  Was wondering whether he will be discharged today.  States he has 24-hour care at home that has been arranged by his sons.  Objective: Vitals:   12/30/22 2124 12/30/22 2224 12/31/22 0513 12/31/22 1238  BP: (!) 144/65  (!) 141/55 113/64  Pulse: (!) 44 87 98 79  Resp: 18  16 20   Temp: 98.3 F (36.8 C)  97.8 F (36.6 C) 98.5 F (36.9 C)  TempSrc: Oral  Axillary   SpO2: 95%  98% 94%  Weight:      Height:        Intake/Output Summary (Last 24 hours) at 12/31/2022 1902 Last data filed at 12/31/2022 1700 Gross per 24 hour  Intake 3512.27 ml  Output 3900 ml  Net -387.73 ml   Filed Weights   12/27/22 2210  Weight: 65 kg    Examination:  General exam: Appears calm and comfortable  Respiratory system: Clear to auscultation anterior lung fields.  No wheezes, no crackles, no rhonchi.  Fair air movement.  Speaking in full sentences.Marland Kitchen Respiratory effort normal. Cardiovascular system: S1 & S2 heard, RRR. No JVD, murmurs, rubs, gallops or clicks. No pedal edema. Gastrointestinal system: Abdomen is nondistended, soft and nontender. No organomegaly or masses felt. Normal bowel sounds heard. Central nervous system: Alert and oriented. No focal neurological deficits. Extremities: Symmetric 5 x 5 power. Skin: No rashes, lesions or ulcers Psychiatry: Judgement and insight appear normal. Mood & affect appropriate.      Data Reviewed: I have personally reviewed following labs and imaging studies  CBC: Recent Labs  Lab 12/27/22 1340 12/27/22 2226 12/29/22 0354 12/29/22 1036 12/30/22 1620 12/30/22 2258 12/31/22 0444 12/31/22 1045 12/31/22 1624  WBC 6.6  --  11.2*  --   --   --   --   --   --   HGB 8.9*   < > 8.2*   < > 9.5* 8.8* 8.4* 8.8* 9.2*  HCT 28.6*   < > 27.0*   < > 30.6* 28.7* 27.2* 28.2* 30.8*  MCV 91.7  --  94.7  --   --   --   --   --   --   PLT 152  --  154  --   --   --   --   --   --    < > = values in this interval not displayed.    Basic Metabolic Panel: Recent Labs  Lab 12/27/22 1340 12/28/22 0356 12/29/22 0354 12/31/22 1045  NA 142 141 141 141  K 4.7 4.3 4.7 4.1  CL 108 110 107 110  CO2 28 23 22 23   GLUCOSE 91 97 122* 83  BUN 46* 44* 36* 30*  CREATININE 1.81* 1.43* 1.50* 1.15  CALCIUM 9.6 8.8* 9.0 8.6*  MG  --   --   --  2.3    GFR: Estimated Creatinine Clearance: 33.6 mL/min (by C-G formula based on SCr of 1.15 mg/dL).  Liver Function Tests: Recent Labs  Lab 12/29/22 0354  AST  24  ALT 17  ALKPHOS 41  BILITOT 1.5*  PROT 6.7  ALBUMIN 4.0    CBG: No results for input(s): "GLUCAP" in the last 168 hours.   Recent Results (from the past 240 hour(s))  Urine Culture     Status: Abnormal   Collection Time: 12/27/22  4:27 PM   Specimen: Urine, Clean Catch  Result Value Ref Range Status   Specimen Description   Final    URINE, CLEAN CATCH Performed at [facility], [address], [place], [place] [address]    Special Requests   Final    NONE Performed at [facility], [place], [place] [address]    Culture 60,000 COLONIES/mL ENTEROCOCCUS FAECALIS (A)  Final   Report Status 12/30/2022 FINAL  Final   Organism ID, Bacteria ENTEROCOCCUS FAECALIS (A)  Final      Susceptibility   Enterococcus faecalis - MIC*    AMPICILLIN <=2 SENSITIVE Sensitive     NITROFURANTOIN <=16 SENSITIVE Sensitive      VANCOMYCIN 1 SENSITIVE Sensitive     * 60,000 COLONIES/mL ENTEROCOCCUS FAECALIS         Radiology Studies: No results found.      Scheduled Meds:  amoxicillin  500 mg Oral Q12H   apixaban  2.5 mg Oral BID   Chlorhexidine Gluconate Cloth  6 each Topical Daily   gabapentin  600 mg Oral QHS   hydrALAZINE  25 mg Oral BID   metoprolol tartrate  25 mg Oral BID   QUEtiapine  25 mg Oral QHS   Continuous Infusions:  lactated ringers 75 mL/hr at 12/31/22 1736   sodium chloride irrigation       LOS: 4 days    Time spent: 40 minutes    Irine Seal, MD [facility]   To contact the attending provider between 7A-7P or the covering provider during after hours 7P-7A, please log into the web site [email or web address] and access using universal [place] password for that web site. If you do not have the password, please call the hospital operator.  12/31/2022, 7:02 PM

## 2022-12-31 NOTE — Progress Notes (Signed)
.   Transition of Care [facility]) Screening Note   Patient Details  Name: Paul Mays Date of Birth: 1928-09-24   Transition of Care [facility]) CM/SW Contact:    Illene Regulus, LCSW Phone Number: 12/31/2022, 11:05 AM    Transition of Care Department [facility]) has reviewed patient and no TOC needs have been identified at this time. We will continue to monitor patient advancement through interdisciplinary progression rounds. If new patient transition needs arise, please place a TOC consult.

## 2022-12-31 NOTE — Evaluation (Signed)
Physical Therapy Evaluation Patient Details Name: Paul Mays MRN: [id number] DOB: [birth year] Today's Date: 12/31/2022  History of Present Illness  89 yo male admitted with gross hematuria, Afib. Hx of CAD, cervicalgia, polyneuropathy, venous insufficiency, pulm HTN, Afib, gout, CKD, TAVR, prostate Ca, anemia, HF  Clinical Impression  On eval, pt required Mod A for bed mobility and Min guard A to ambulate ~20 feet wth a RW. O2: 87% on RA with activity. He exhibits some confusion and short term memory deficits. Pt presents with general weakness, decreased activity tolerance, and impaired gait and balance. Pt reports he is planning to return home where he lives with his wife. He states he has some intermittent private duty assistance. He also reports his sons are nearby and able to assist as needed. Will plan to follow pt during this hospital stay. Pt will benefit from post acute rehab.        Recommendations for follow up therapy are one component of a multi-disciplinary discharge planning process, led by the attending physician.  Recommendations may be updated based on patient status, additional functional criteria and insurance authorization.  Follow Up Recommendations       Assistance Recommended at Discharge Frequent or constant Supervision/Assistance  Patient can return home with the following  Help with stairs or ramp for entrance;Assist for transportation;Assistance with cooking/housework;A little help with walking and/or transfers;A little help with bathing/dressing/bathroom    Equipment Recommendations  (RW if pt doesn't already have one)  Recommendations for Other Services  OT consult    Functional Status Assessment Patient has had a recent decline in their functional status and demonstrates the ability to make significant improvements in function in a reasonable and predictable amount of time.     Precautions / Restrictions Precautions Precautions: Fall Precaution Comments:  monitor O2/HR Restrictions Weight Bearing Restrictions: No      Mobility  Bed Mobility Overal bed mobility: Needs Assistance Bed Mobility: Supine to Sit, Sit to Supine     Supine to sit: Mod assist, HOB elevated Sit to supine: Mod assist, HOB elevated   General bed mobility comments: Assist for trunk, LEs, and to scoot to EOB. Increased time. Cues provided.    Transfers Overall transfer level: Needs assistance Equipment used: Rolling walker (2 wheels) Transfers: Sit to/from Stand Sit to Stand: From elevated surface, Min guard           General transfer comment: Min guard A to rise, from elevated bed. Cues for safety. Pt is a bit impulsive    Ambulation/Gait Ambulation/Gait assistance: Min guard Gait Distance (Feet): 20 Feet Assistive device: Rolling walker (2 wheels) Gait Pattern/deviations: Step-through pattern, Decreased stride length       General Gait Details: Pt was able to ambulate across the room and back with a RW. Min guard A. O2 87% on RA, dyspnea 2/4.  Stairs            Wheelchair Mobility    Modified Rankin (Stroke Patients Only)       Balance Overall balance assessment: Needs assistance         Standing balance support: During functional activity, Reliant on assistive device for balance Standing balance-Leahy Scale: Fair                               Pertinent Vitals/Pain Pain Assessment Pain Assessment: Faces Faces Pain Scale: Hurts a little bit Pain Location: catheter Pain Descriptors / Indicators: Pressure, Discomfort Pain  Intervention(s): Limited activity within patient's tolerance, Monitored during session, Repositioned    Home Living Family/patient expects to be discharged to:: Private residence Living Arrangements: Spouse/significant other Available Help at Discharge: Family;Available 24 hours/day Type of Home: House           Home Equipment: Conservation officer, nature (2 wheels);Cane - single  point;BSC/3in1;Shower seat      Prior Function Prior Level of Function : Needs assist             Mobility Comments: ambulatory without a device ADLs Comments: pt reports normally being able to perform ADLs unassisted     Hand Dominance   Dominant Hand: Left    Extremity/Trunk Assessment   Upper Extremity Assessment Upper Extremity Assessment: Defer to OT evaluation    Lower Extremity Assessment Lower Extremity Assessment: Generalized weakness    Cervical / Trunk Assessment Cervical / Trunk Assessment: Kyphotic  Communication   Communication: HOH  Cognition Arousal/Alertness: Awake/alert Behavior During Therapy: WFL for tasks assessed/performed Overall Cognitive Status: No family/caregiver present to determine baseline cognitive functioning Area of Impairment: Orientation, Memory                 Orientation Level: Disoriented to, Time   Memory: Decreased short-term memory                  General Comments      Exercises     Assessment/Plan    PT Assessment Patient needs continued PT services  PT Problem List Decreased strength;Decreased balance;Decreased mobility;Decreased activity tolerance;Decreased cognition;Decreased knowledge of use of DME       PT Treatment Interventions DME instruction;Gait training;Functional mobility training;Therapeutic activities;Balance training;Patient/family education;Therapeutic exercise    PT Goals (Current goals can be found in the Care Plan section)  Acute Rehab PT Goals Patient Stated Goal: home soon. regain PLOF/independence PT Goal Formulation: With patient Time For Goal Achievement: 01/14/23 Potential to Achieve Goals: Good    Frequency Min 3X/week     Co-evaluation               AM-PAC PT "6 Clicks" Mobility  Outcome Measure Help needed turning from your back to your side while in a flat bed without using bedrails?: A Little Help needed moving from lying on your back to sitting on the  side of a flat bed without using bedrails?: A Lot Help needed moving to and from a bed to a chair (including a wheelchair)?: A Little Help needed standing up from a chair using your arms (e.g., wheelchair or bedside chair)?: A Little Help needed to walk in hospital room?: A Little Help needed climbing 3-5 steps with a railing? : A Lot 6 Click Score: 16    End of Session Equipment Utilized During Treatment: Gait belt Activity Tolerance: Patient tolerated treatment well Patient left: in bed;with call bell/phone within reach;with bed alarm set   PT Visit Diagnosis: Muscle weakness (generalized) (M62.81);Difficulty in walking, not elsewhere classified (R26.2)    Time: [id number] PT Time Calculation (min) (ACUTE ONLY): 14 min   Charges:   PT Evaluation $PT Eval Low Complexity: Sher[place], PT Acute Rehabilitation  Office: [phone]

## 2022-12-31 NOTE — Progress Notes (Signed)
  Subjective: Resting comfortably. CBI was started last afternoon and is presently off. Urine is amber with CBI clamped.  Objective: Vital signs in last 24 hours: Temp:  [97.8 F (36.6 C)-98.4 F (36.9 C)] 97.8 F (36.6 C) (03/27 0513) Pulse Rate:  [44-98] 98 (03/27 0513) Resp:  [12-18] 16 (03/27 0513) BP: (113-144)/(55-79) 141/55 (03/27 0513) SpO2:  [94 %-98 %] 98 % (03/27 0513)  Intake/Output from previous day: 03/26 0701 - 03/27 0700 In: 3681.9 [P.O.:600; I.V.:2692.3; Blood:389.6] Out: 6300 [Urine:6300] Intake/Output this shift: No intake/output data recorded.  UOP: 6.3L (with CBI)  Physical Exam:  General: Alert and oriented CV: RRR Lungs: Clear Abdomen: Soft, ND, NT Ext: NT, No erythema  Lab Results: Recent Labs    12/30/22 1620 12/30/22 2258 12/31/22 0444  HGB 9.5* 8.8* 8.4*  HCT 30.6* 28.7* 27.2*   BMET Recent Labs    12/29/22 0354  NA 141  K 4.7  CL 107  CO2 22  GLUCOSE 122*  BUN 36*  CREATININE 1.50*  CALCIUM 9.0     Studies/Results: No results found.  Assessment/Plan: #1.  Gross hematuria: Etiology likely related to radiation cystitis given history of prior brachytherapy.  Will need full outpatient evaluation to rule out underlying bladder mass.   #2.  Prostate cancer: Localized prostate cancer treated with brachytherapy in 2000 with PSA nadir at 0.1 last followed in 2016.   -Urine clear with CBI capped. -Favor keeping 20 Fr 3 way catheter in place. Void trial outpatient. -Will need outpatient cystoscopy and CT hematuria protocol for full evaluation. Messaged schedulers to arrange. -If urine stays clear, appropriate to discharge home with foley from GU standpoint today -He restarted eliquis low dose yesterday  -Continue ampicillin for enterococcus UTI -Following   LOS: 4 days   Matt R. Favio Moder MD 12/31/2022, 8:21 AM [facility]  Pager: [phone]

## 2023-01-01 ENCOUNTER — Inpatient Hospital Stay (HOSPITAL_COMMUNITY): Payer: Medicare Other

## 2023-01-01 DIAGNOSIS — I350 Nonrheumatic aortic (valve) stenosis: Secondary | ICD-10-CM

## 2023-01-01 DIAGNOSIS — I10 Essential (primary) hypertension: Secondary | ICD-10-CM | POA: Diagnosis not present

## 2023-01-01 DIAGNOSIS — R0902 Hypoxemia: Secondary | ICD-10-CM

## 2023-01-01 DIAGNOSIS — I5032 Chronic diastolic (congestive) heart failure: Secondary | ICD-10-CM | POA: Diagnosis not present

## 2023-01-01 DIAGNOSIS — I25810 Atherosclerosis of coronary artery bypass graft(s) without angina pectoris: Secondary | ICD-10-CM | POA: Diagnosis not present

## 2023-01-01 DIAGNOSIS — N3001 Acute cystitis with hematuria: Secondary | ICD-10-CM | POA: Diagnosis not present

## 2023-01-01 LAB — BASIC METABOLIC PANEL
Anion gap: 8 (ref 5–15)
BUN: 25 mg/dL — ABNORMAL HIGH (ref 8–23)
CO2: 23 mmol/L (ref 22–32)
Calcium: 8.5 mg/dL — ABNORMAL LOW (ref 8.9–10.3)
Chloride: 109 mmol/L (ref 98–111)
Creatinine, Ser: 1.07 mg/dL (ref 0.61–1.24)
GFR, Estimated: >60 mL/min (ref >60)
Glucose, Bld: 82 mg/dL (ref 70–99)
Potassium: 4 mmol/L (ref 3.5–5.1)
Sodium: 140 mmol/L (ref 135–145)

## 2023-01-01 LAB — CBC
HCT: 27.8 % — ABNORMAL LOW (ref 39.0–52.0)
Hemoglobin: 8.7 g/dL — ABNORMAL LOW (ref 13.0–17.0)
MCH: 29.1 pg (ref 26.0–34.0)
MCHC: 31.3 g/dL (ref 30.0–36.0)
MCV: 93 fL (ref 80.0–100.0)
Platelets: 134 10*3/uL — ABNORMAL LOW (ref 150–400)
RBC: 2.99 MIL/uL — ABNORMAL LOW (ref 4.22–5.81)
RDW: 15.3 % (ref 11.5–15.5)
WBC: 7.6 10*3/uL (ref 4.0–10.5)
nRBC: 0 % (ref 0.0–0.2)

## 2023-01-01 LAB — BRAIN NATRIURETIC PEPTIDE: B Natriuretic Peptide: 2505.8 pg/mL — ABNORMAL HIGH (ref 0.0–100.0)

## 2023-01-01 MED ORDER — AMOXICILLIN 500 MG PO CAPS
500.0000 mg | ORAL_CAPSULE | Freq: Three times a day (TID) | ORAL | Status: DC
  Administered 2023-01-01 – 2023-01-04 (×10): 500 mg via ORAL
  Filled 2023-01-01 (×10): qty 1
  Filled 2023-01-01: qty 2

## 2023-01-01 MED ORDER — OXYBUTYNIN CHLORIDE 5 MG PO TABS: 10.0000 mg | ORAL_TABLET | Freq: Three times a day (TID) | ORAL | 0 refills | Status: DC | PRN

## 2023-01-01 MED ORDER — ACETAMINOPHEN 325 MG PO TABS: 650.0000 mg | ORAL_TABLET | Freq: Four times a day (QID) | ORAL | Status: DC | PRN

## 2023-01-01 MED ORDER — FUROSEMIDE 20 MG PO TABS: 20.0000 mg | ORAL_TABLET | Freq: Every day | ORAL | 1 refills | Status: DC

## 2023-01-01 MED ORDER — FUROSEMIDE 10 MG/ML IJ SOLN
40.0000 mg | Freq: Once | INTRAMUSCULAR | Status: AC
  Administered 2023-01-01: 40 mg via INTRAVENOUS
  Filled 2023-01-01: qty 4

## 2023-01-01 MED ORDER — QUETIAPINE FUMARATE 25 MG PO TABS: 25.0000 mg | ORAL_TABLET | Freq: Every evening | ORAL | 0 refills | Status: DC | PRN

## 2023-01-01 MED ORDER — AMOXICILLIN 500 MG PO CAPS: 500.0000 mg | ORAL_CAPSULE | Freq: Three times a day (TID) | ORAL | 0 refills | Status: AC

## 2023-01-01 MED ORDER — APIXABAN 2.5 MG PO TABS: 2.5000 mg | ORAL_TABLET | Freq: Two times a day (BID) | ORAL | 2 refills | Status: DC

## 2023-01-01 NOTE — Discharge Summary (Signed)
Physician Discharge Summary  Paul Mays [id number] DOB: 1927-11-04 DOA: 12/27/2022  PCP: Binnie Rail, MD  Admit date: 12/27/2022 Discharge date: 01/01/2023  Time spent: 60 minutes  Recommendations for Outpatient Follow-up:  [facility]up with [facility] in 1 to 2 weeks.  On follow-up will need a voiding trial and outpatient evaluation to rule out bladder mass. Follow-up with Binnie Rail, MD in 1 week.  On follow-up patient needs a basic metabolic profile done to follow-up on electrolytes and renal function.  Patient's home dose Lasix was resumed at half home dose as patient had presented with AKI.  Patient need a CBC done to follow-up on H&H.   Discharge Diagnoses:  Principal Problem:   Gross hematuria Active Problems:   Hyperlipidemia   Gout   Essential hypertension   Severe aortic stenosis   OBSTRUCTIVE SLEEP APNEA   Fall   CAD (coronary artery disease)   Prostate cancer ([place])   Acute cystitis with hematuria   Paroxysmal atrial fibrillation (HCC)   Chronic diastolic CHF (congestive heart failure) ([place])   Discharge Condition: Stable and improved.  Diet recommendation: Heart healthy  Filed Weights   12/27/22 2210  Weight: 65 kg    History of present illness:  HPI per Dr Claria Dice This is a 89 year old male with past medical history significant for pulmonary hypertension, atrial fibrillation on Eliquis,  HTN, HLD, migraine, CAD, gout, CKD stage III, transcatheter aortic valve replacement, and a remote h/o history of prostate cancer.  Patient states he got up this morning and noted he was peeing bright red blood.  He came to the ER. Per patient he was started on Eliquis 2 weeks ago for new onset atrial fibrillation.  For the past week he has noted stinging whenever he pees.  He denies foul-smelling urine, fevers or chills.  He has a history of prostate cancer.  He has been in remission over 10 years.   In the ER patient with gross hematuria.  CT abdomen pelvis  revealed: 5 cm lobulated density within the bladder, which could represent blood clot or bladder neoplasm. Consider cystoscopy for further evaluation.  Urologist Dr. Abner Greenspan was contacted by EDP.  Hospital Course:  #1 gross hematuria/Enterococcus UTI -Patient was then with gross hematuria in the setting of recently started Eliquis 10/28/2022 and history of brachytherapy. -Patient seen in consultation by urology and patient started on continuous bladder irrigation with improvement with hematuria. -Eliquis initially discontinued and resumed at a lower dose of 2.5 mg twice daily on 12/30/2022. -CBI is discontinued today. -Patient seen in consultation by urology, Dr. Abner Greenspan who feels likely from radiation cystitis from prior brachytherapy. -Patient with history of localized prostate cancer followed by urology in the outpatient setting last seen in 2016. -Urine cultures with Enterococcus, IV Rocephin discontinued and patient started on amoxicillin on 12/29/2022. -Patient underwent voiding trial on day of discharge however failed and Foley catheter placed back in. -Urology recommended continuation of Foley catheter with voiding trial to be done in the outpatient setting, will need further outpatient evaluation to rule out underlying bladder mass. -Patient will be discharged in stable and improved condition.  Outpatient follow-up with urology.   2.  New onset A-fib Cha2ds2vasc score 5.  Newly diagnosed in January 2024. -Patient initially seen by cardiology started on Eliquis 5 mg twice daily on 10/28/2022. -Dr. Zigmund  discussed with cardiology, Dr. Acie Fredrickson and it was recommended that patient may be restarted at a lower dose of Eliquis 2.5 mg twice daily due to age  above 80, elevated creatinine once cleared by urology. -Outpatient follow-up with cardiology as previously scheduled..   3.  CAD/status post TAVR bioprosthetic valve February 2023 -Stable. -Outpatient follow-up.   4.  Hypertension -Patient  maintained on home regimen Lopressor, hydralazine.    5.  Gout -Remained stable.     6.  Hyperlipidemia -Patient maintained on home regimen statin.   7.  Peripheral neuropathy -Patient maintained on home regimen Neurontin.    8.  History of diastolic CHF -Stable. -Patient remained euvolemic, Lasix was held during the hospitalization.   -Patient maintained on home regimen Lopressor hydralazine.   -Lasix will be resumed on discharge at half home dose.    9.  AKI -Status post Foley catheter placement, with improvement with renal function, creatinine improved with hydration and holding diuretics and was down to 1.07 by day of discharge from as high as 1.81 on 12/27/2022. -Patient's diuretics will be resumed on discharge at half home dose. -Outpatient follow-up with PCP.  Will need lab work in 1 week.   10.  Acute on chronic anemia of chronic disease -Hemoglobin noted as low as 7.5 from 8.9 on admission.  Likely secondary to gross hematuria. -Status post transfusion 1 unit packed red blood cells hemoglobin stabilized at 8.7 by day of discharge.   -Outpatient follow-up with PCP.     11.  Delirium/sundowning -Patient maintained on Seroquel at bedtime during the hospitalization and will be discharged on Seroquel 25 mg nightly as needed agitation.   -Outpatient follow-up with PCP.        Procedures: CT renal stone protocol 12/27/2022 Transfusion 1 unit packed red blood cells 12/30/2022  Consultations: Urology: Dr. Abner Greenspan 12/27/2022  Discharge Exam: Vitals:   01/01/23 0154 01/01/23 0155  BP: 128/73 128/73  Pulse: 73 70  Resp: 20 20  Temp: 98.5 F (36.9 C) 98.5 F (36.9 C)  SpO2: 94% 94%    General: NAD Cardiovascular: RRR no murmurs rubs or gallops.  No JVD.  No lower extremity edema. Respiratory: Clear to auscultation bilaterally.  No wheezes, no crackles, no rhonchi.  Discharge Instructions   Discharge Instructions     Diet - low sodium heart healthy   Complete by: As  directed    Increase activity slowly   Complete by: As directed       Allergies as of 01/01/2023       Reactions   Lisinopril Other (See Comments)   Hyperkalemia in the context of combined spironolactone and lisinopril therapy   Spironolactone Other (See Comments)   Hyperkalemia in context of spironolactone and lisinopril therapy        Medication List     TAKE these medications    acetaminophen 325 MG tablet Commonly known as: TYLENOL Take 2 tablets (650 mg total) by mouth every 6 (six) hours as needed for mild pain (or Fever >/= 101).   allopurinol 300 MG tablet Commonly known as: ZYLOPRIM TAKE 1 TABLET(300 MG) BY MOUTH DAILY AS NEEDED FOR GOUT FLARE What changed: See the new instructions.   amoxicillin 500 MG tablet Commonly known as: AMOXIL Take 4 tablets (2,000 mg total) by mouth as directed. 1 HOUR PRIOR TO DENTAL APPOINTMENTS What changed: Another medication with the same name was added. Make sure you understand how and when to take each.   amoxicillin 500 MG capsule Commonly known as: AMOXIL Take 1 capsule (500 mg total) by mouth 3 (three) times daily for 7 days. What changed: You were already taking a medication with the  same name, and this prescription was added. Make sure you understand how and when to take each.   apixaban 2.5 MG Tabs tablet Commonly known as: ELIQUIS Take 1 tablet (2.5 mg total) by mouth 2 (two) times daily. What changed:  medication strength how much to take   cyanocobalamin 500 MCG tablet Commonly known as: VITAMIN B12 Take 500 mcg by mouth daily.   furosemide 20 MG tablet Commonly known as: LASIX Take 1 tablet (20 mg total) by mouth daily. What changed:  medication strength how much to take   gabapentin 600 MG tablet Commonly known as: NEURONTIN Take 1 tablet (600 mg total) by mouth at bedtime.   hydrALAZINE 25 MG tablet Commonly known as: APRESOLINE TAKE 1 TABLET(25 MG) BY MOUTH IN THE MORNING AND AT BEDTIME What  changed: See the new instructions.   iron polysaccharides 150 MG capsule Commonly known as: NIFEREX Take 1 capsule (150 mg total) by mouth daily.   metoprolol tartrate 25 MG tablet Commonly known as: LOPRESSOR Take 1 tablet (25 mg total) by mouth 2 (two) times daily.   oxybutynin 5 MG tablet Commonly known as: DITROPAN Take 2 tablets (10 mg total) by mouth every 8 (eight) hours as needed for bladder spasms.   potassium chloride 10 MEQ tablet Commonly known as: KLOR-CON M Take 1 tablet by mouth daily (with Furosemide)   pravastatin 40 MG tablet Commonly known as: PRAVACHOL Take 1 tablet (40 mg total) by mouth daily.   QUEtiapine 25 MG tablet Commonly known as: SEROQUEL Take 1 tablet (25 mg total) by mouth at bedtime as needed (agitation).   Vitamin D3 50 MCG (2000 UT) Caps Generic drug: Cholecalciferol Take 2,000 Units by mouth daily.       Allergies  Allergen Reactions   Lisinopril Other (See Comments)    Hyperkalemia in the context of combined spironolactone and lisinopril therapy   Spironolactone Other (See Comments)    Hyperkalemia in context of spironolactone and lisinopril therapy    Follow-up Information     [facility]. Schedule an appointment as soon as possible for a visit in 1 week(s).   Why: Follow-up in 1 to 2 weeks. Contact information: [place]        Binnie Rail, MD. Schedule an appointment as soon as possible for a visit in 1 week(s).   Specialty: Internal Medicine Why: Will need BMET on follow-up. Contact information: [place] [address] [phone]                  The results of significant diagnostics from this hospitalization (including imaging, microbiology, ancillary and laboratory) are listed below for reference.    Significant Diagnostic Studies: CT Renal Stone Study  Result Date: 12/27/2022 CLINICAL DATA:  Hematuria and  dysuria.  Urinary retention. EXAM: CT ABDOMEN AND PELVIS WITHOUT CONTRAST TECHNIQUE: Multidetector CT imaging of the abdomen and pelvis was performed following the standard protocol without IV contrast. RADIATION DOSE REDUCTION: This exam was performed according to the departmental dose-optimization program which includes automated exposure control, adjustment of the mA and/or kV according to patient size and/or use of iterative reconstruction technique. COMPARISON:  03/02/2021 FINDINGS: Lower chest: Small bilateral pleural effusions without significant change. Stable cardiomegaly. Hepatobiliary: No mass visualized on this unenhanced exam. Fluid attenuation cyst again noted in the left hepatic lobe. Tiny calcified gallstones are seen, however there is no evidence of cholecystitis or biliary ductal dilatation. Pancreas:  No mass or inflammatory process visualized on this unenhanced exam. Spleen:  Within normal limits in size. Adrenals/Urinary tract: 2 mm calculus noted in lower pole of left kidney. No evidence of ureteral calculi or hydronephrosis. Lobulated density is seen within the bladder lumen along the posterior wall which measures 5.1 x 3.0 cm. This has attenuation value of 42 Hounsfield units. This could represent blood clot or bladder neoplasm. Stomach/Bowel: No evidence of obstruction, inflammatory process, or abnormal fluid collections. Vascular/Lymphatic: No pathologically enlarged lymph nodes identified. No evidence of abdominal aortic aneurysm. Aortic atherosclerotic calcification incidentally noted. Reproductive:  Brachytherapy seeds seen throughout prostate gland. Other:  Large left scrotal hydrocele is again seen. Musculoskeletal:  No suspicious bone lesions identified. IMPRESSION: 5 cm lobulated density within the bladder, which could represent blood clot or bladder neoplasm. Consider cystoscopy for further evaluation. Tiny nonobstructing left renal calculus. No evidence of ureteral calculi or  hydronephrosis. Cholelithiasis. No radiographic evidence of cholecystitis. Large left scrotal hydrocele. Stable small bilateral pleural effusions. Aortic Atherosclerosis (ICD10-I70.0). Electronically Signed   By: Marlaine Hind M.D.   On: 12/27/2022 15:31    Microbiology: Recent Results (from the past 240 hour(s))  Urine Culture     Status: Abnormal   Collection Time: 12/27/22  4:27 PM   Specimen: Urine, Clean Catch  Result Value Ref Range Status   Specimen Description   Final    URINE, CLEAN CATCH Performed at [facility], [address], [place], [place] [address]    Special Requests   Final    NONE Performed at [facility], [address] [place], [place] [address]    Culture 60,000 COLONIES/mL ENTEROCOCCUS FAECALIS (A)  Final   Report Status 12/30/2022 FINAL  Final   Organism ID, Bacteria ENTEROCOCCUS FAECALIS (A)  Final      Susceptibility   Enterococcus faecalis - MIC*    AMPICILLIN <=2 SENSITIVE Sensitive     NITROFURANTOIN <=16 SENSITIVE Sensitive     VANCOMYCIN 1 SENSITIVE Sensitive     * 60,000 COLONIES/mL ENTEROCOCCUS FAECALIS     Labs: Basic Metabolic Panel: Recent Labs  Lab 12/27/22 1340 12/28/22 0356 12/29/22 0354 12/31/22 1045 01/01/23 0440  NA 142 141 141 141 140  K 4.7 4.3 4.7 4.1 4.0  CL 108 110 107 110 109  CO2 28 23 22 23 23   GLUCOSE 91 97 122* 83 82  BUN 46* 44* 36* 30* 25*  CREATININE 1.81* 1.43* 1.50* 1.15 1.07  CALCIUM 9.6 8.8* 9.0 8.6* 8.5*  MG  --   --   --  2.3  --    Liver Function Tests: Recent Labs  Lab 12/29/22 0354  AST 24  ALT 17  ALKPHOS 41  BILITOT 1.5*  PROT 6.7  ALBUMIN 4.0   No results for input(s): "LIPASE", "AMYLASE" in the last 168 hours. No results for input(s): "AMMONIA" in the last 168 hours. CBC: Recent Labs  Lab 12/27/22 1340 12/27/22 2226 12/29/22 0354 12/29/22 1036 12/30/22 2258 12/31/22 0444 12/31/22 1045 12/31/22 1624 01/01/23 0440  WBC 6.6  --  11.2*  --    --   --   --   --  7.6  HGB 8.9*   < > 8.2*   < > 8.8* 8.4* 8.8* 9.2* 8.7*  HCT 28.6*   < > 27.0*   < > 28.7* 27.2* 28.2* 30.8* 27.8*  MCV 91.7  --  94.7  --   --   --   --   --  93.0  PLT 152  --  154  --   --   --   --   --  134*   < > = values in this interval not displayed.   Cardiac Enzymes: No results for input(s): "CKTOTAL", "CKMB", "CKMBINDEX", "TROPONINI" in the last 168 hours. BNP: BNP (last 3 results) No results for input(s): "BNP" in the last 8760 hours.  ProBNP (last 3 results) No results for input(s): "PROBNP" in the last 8760 hours.  CBG: No results for input(s): "GLUCAP" in the last 168 hours.     Signed:  Irine Seal MD.  [facility] 01/01/2023, 4:04 PM

## 2023-01-01 NOTE — Progress Notes (Signed)
Spoke with Dr Abner Greenspan about patient bloody urine and bleeding at foley site; He stated to place gauze to area and continue to assess urine for clots or decreased output; Dr Abner Greenspan stated to call on call urologist with any other concerns.

## 2023-01-01 NOTE — TOC Transition Note (Signed)
Transition of Care [facility]) - CM/SW Discharge Note   Patient Details  Name: Paul Mays MRN: [id number] Date of Birth: November 12, 1927  Transition of Care [facility]) CM/SW Contact:  Illene Regulus, LCSW Phone Number: 01/01/2023, 4:11 PM   Clinical Narrative:    CSW spoke with pt's son terry about [facility] recommendations, has agreed , no preference. Referral sent to [facility].pt accepted for HHPT/OT/aide. Pt's son Coralyn Mark reports his brother will pick pt up at d/c. No additional needs TOC sign off.   Final next level of care: Home w Home Health Services Barriers to Discharge: No Barriers Identified   Patient Goals and CMS Choice CMS Medicare.gov Compare Post Acute Care list provided to:: Patient Represenative (must comment) Choice offered to / list presented to : Adult Children  Discharge Placement                         Discharge Plan and Services Additional resources added to the After Visit Summary for                              [place]:  (medi) Date [facility] Agency Contacted: 01/01/23 Time [place]: [phone] Representative spoke with at [place]: [place] ([place]) Interventions SDOH Screenings   Food Insecurity: No Food Insecurity (12/28/2022)  Housing: Low Risk  (12/28/2022)  Transportation Needs: No Transportation Needs (12/28/2022)  Utilities: Not At Risk (12/28/2022)  Alcohol Screen: Low Risk  (01/29/2022)  Depression (PHQ2-9): Low Risk  (11/28/2022)  Financial Resource Strain: Low Risk  (01/29/2022)  Physical Activity: Sufficiently Active (01/29/2022)  Social Connections: Socially Integrated (01/29/2022)  Stress: No Stress Concern Present (01/29/2022)  Tobacco Use: Low Risk  (12/27/2022)     Readmission Risk Interventions    11/13/2021   11:43 AM  Readmission Risk Prevention Plan  Post Dischage Appt Complete  Medication Screening Complete  Transportation Screening Complete

## 2023-01-01 NOTE — Progress Notes (Signed)
Patient was to be discharged and just prior to discharge was called by RN that patient with sats of 85% on room air.  Patient not on home O2 prior to admission. -Concern for possible volume overload. -Check daily weights.  Check checks x-ray. -Lasix 40 mg IV x 1. -Cancel discharge. -Check ambulatory sats in the AM.  No charge. -

## 2023-01-01 NOTE — Progress Notes (Signed)
Patient vitals taken before going home and patient pulse oxygen level was 85% on room air; patient was working to breath; informed Dr Grandville Silos and he put in orders. Patient discharge was discontinued

## 2023-01-01 NOTE — Progress Notes (Signed)
  Subjective: Denies pain. Urine remained clear off CBI for 48 hours. Foley removed this AM.  Objective: Vital signs in last 24 hours: Temp:  [98.2 F (36.8 C)-98.5 F (36.9 C)] 98.5 F (36.9 C) (03/28 0155) Pulse Rate:  [70-94] 70 (03/28 0155) Resp:  [20] 20 (03/28 0155) BP: (113-142)/(64-73) 128/73 (03/28 0155) SpO2:  [93 %-94 %] 94 % (03/28 0155)  Intake/Output from previous day: 03/27 0701 - 03/28 0700 In: 820 [P.O.:360; I.V.:460] Out: 1050 [Urine:1050] Intake/Output this shift: No intake/output data recorded. 1L clear yellow  Physical Exam:  General: Alert and oriented CV: RRR Lungs: Clear Abdomen: Soft, ND, NT Ext: NT, No erythema  Lab Results: Recent Labs    12/31/22 1045 12/31/22 1624 01/01/23 0440  HGB 8.8* 9.2* 8.7*  HCT 28.2* 30.8* 27.8*   BMET Recent Labs    12/31/22 1045 01/01/23 0440  NA 141 140  K 4.1 4.0  CL 110 109  CO2 23 23  GLUCOSE 83 82  BUN 30* 25*  CREATININE 1.15 1.07  CALCIUM 8.6* 8.5*     Studies/Results: No results found.  Assessment/Plan: #1.  Gross hematuria: Etiology likely related to radiation cystitis given history of prior brachytherapy.  Will need full outpatient evaluation to rule out underlying bladder mass.   #2.  Prostate cancer: Localized prostate cancer treated with brachytherapy in 2000 with PSA nadir at 0.1 last followed in 2016.   -Urine clear with CBI capped for 48 hours. -Void trial today. Foley removed with small amount of blood form fossa navicularis given stenosis at this location. -Will need outpatient cystoscopy and CT hematuria protocol for full evaluation. Messaged schedulers to arrange. -Continue ampicillin for enterococcus UTI -Following peripherally. Please call with questions.   LOS: 5 days   Matt R. Quintavius Niebuhr MD 01/01/2023, [facility]  Pager: [phone]

## 2023-01-01 NOTE — Progress Notes (Signed)
PHARMACY NOTE:  ANTIMICROBIAL RENAL DOSAGE ADJUSTMENT  Current antimicrobial regimen includes a mismatch between antimicrobial dosage and estimated renal function.  As per policy approved by the Pharmacy & Therapeutics and Medical Executive Committees, the antimicrobial dosage will be adjusted accordingly.  Current antimicrobial dosage:  amoxicillin 500 mg q12h  Indication: UTI  Renal Function:  Estimated Creatinine Clearance: 36.1 mL/min (by C-G formula based on SCr of 1.07 mg/dL). []      On intermittent HD, scheduled: []      On CRRT    Antimicrobial dosage has been changed to:  amoxicillin 500 mg q8h    Thank you for allowing pharmacy to be a part of this patient's care.  Lynelle Doctor, [facility] 01/01/2023 7:43 AM

## 2023-01-02 DIAGNOSIS — I10 Essential (primary) hypertension: Secondary | ICD-10-CM | POA: Diagnosis not present

## 2023-01-02 DIAGNOSIS — G4733 Obstructive sleep apnea (adult) (pediatric): Secondary | ICD-10-CM | POA: Diagnosis not present

## 2023-01-02 DIAGNOSIS — D649 Anemia, unspecified: Secondary | ICD-10-CM | POA: Diagnosis not present

## 2023-01-02 DIAGNOSIS — I25810 Atherosclerosis of coronary artery bypass graft(s) without angina pectoris: Secondary | ICD-10-CM | POA: Diagnosis not present

## 2023-01-02 LAB — BASIC METABOLIC PANEL
Anion gap: 8 (ref 5–15)
BUN: 23 mg/dL (ref 8–23)
CO2: 26 mmol/L (ref 22–32)
Calcium: 8.8 mg/dL — ABNORMAL LOW (ref 8.9–10.3)
Chloride: 107 mmol/L (ref 98–111)
Creatinine, Ser: 1.07 mg/dL (ref 0.61–1.24)
GFR, Estimated: >60 mL/min (ref >60)
Glucose, Bld: 93 mg/dL (ref 70–99)
Potassium: 3.8 mmol/L (ref 3.5–5.1)
Sodium: 141 mmol/L (ref 135–145)

## 2023-01-02 LAB — CBC WITH DIFFERENTIAL/PLATELET
Abs Immature Granulocytes: 0.02 10*3/uL (ref 0.00–0.07)
Basophils Absolute: 0.1 10*3/uL (ref 0.0–0.1)
Basophils Relative: 1 %
Eosinophils Absolute: 0.1 10*3/uL (ref 0.0–0.5)
Eosinophils Relative: 2 %
HCT: 29.9 % — ABNORMAL LOW (ref 39.0–52.0)
Hemoglobin: 9 g/dL — ABNORMAL LOW (ref 13.0–17.0)
Immature Granulocytes: 0 %
Lymphocytes Relative: 9 %
Lymphs Abs: 0.6 10*3/uL — ABNORMAL LOW (ref 0.7–4.0)
MCH: 28.2 pg (ref 26.0–34.0)
MCHC: 30.1 g/dL (ref 30.0–36.0)
MCV: 93.7 fL (ref 80.0–100.0)
Monocytes Absolute: 0.8 10*3/uL (ref 0.1–1.0)
Monocytes Relative: 12 %
Neutro Abs: 5.4 10*3/uL (ref 1.7–7.7)
Neutrophils Relative %: 76 %
Platelets: 150 10*3/uL (ref 150–400)
RBC: 3.19 MIL/uL — ABNORMAL LOW (ref 4.22–5.81)
RDW: 15.1 % (ref 11.5–15.5)
WBC: 7 10*3/uL (ref 4.0–10.5)
nRBC: 0 % (ref 0.0–0.2)

## 2023-01-02 LAB — BRAIN NATRIURETIC PEPTIDE: B Natriuretic Peptide: 2982.8 pg/mL — ABNORMAL HIGH (ref 0.0–100.0)

## 2023-01-02 LAB — MAGNESIUM: Magnesium: 2.2 mg/dL (ref 1.7–2.4)

## 2023-01-02 MED ORDER — FUROSEMIDE 10 MG/ML IJ SOLN
20.0000 mg | Freq: Two times a day (BID) | INTRAMUSCULAR | Status: AC
  Administered 2023-01-02 (×2): 20 mg via INTRAVENOUS
  Filled 2023-01-02 (×2): qty 2

## 2023-01-02 MED ORDER — FUROSEMIDE 40 MG PO TABS: 40.0000 mg | ORAL_TABLET | Freq: Every day | ORAL | Status: DC

## 2023-01-02 NOTE — Care Management Important Message (Signed)
Important Message  Patient Details IM Letter given. Name: Paul Mays MRN: [id number] Date of Birth: 10-10-1927   Medicare Important Message Given:  Yes     Kerin Salen 01/02/2023, 10:57 AM

## 2023-01-02 NOTE — Progress Notes (Signed)
PROGRESS NOTE    Paul Mays  [id number] DOB: [birth year] DOA: 12/27/2022 PCP: Binnie Rail, MD    Chief Complaint  Patient presents with   Hematuria    Brief Narrative:  89 year old male with past medical history significant for pulmonary hypertension, atrial fibrillation on Eliquis,HTN, HLD, migraine, CAD, gout, CKD stage III, transcatheter aortic valve replacement, and a remote h/o history of prostate cancer.  He is admitted with gross hematuria.  Patient was started on Eliquis on 10/28/2022 for new onset atrial fibrillation. He underwent brachytherapy in 2000 CT renal stone study-12/27/2022-5 cm lobulated density within the bladder, which could represent blood clot or bladder neoplasm. Consider cystoscopy for further evaluation.Tiny nonobstructing left renal calculus. No evidence of ureteral calculi or hydronephrosis.Cholelithiasis. No radiographic evidence of cholecystitis.Large left scrotal hydrocele.Stable small bilateral pleural effusions. Patient seen in consultation by urology, CBI is done, improvement with hematuria.   Assessment & Plan:   Principal Problem:   Gross hematuria Active Problems:   Hyperlipidemia   Gout   Essential hypertension   Severe aortic stenosis   OBSTRUCTIVE SLEEP APNEA   Fall   CAD (coronary artery disease)   Prostate cancer (HCC)   Acute cystitis with hematuria   Paroxysmal atrial fibrillation (HCC)   Chronic diastolic CHF (congestive heart failure) ([facility])   Hypoxia  #1 gross hematuria/Enterococcus UTI -Patient was then with gross hematuria in the setting of recently started Eliquis 10/28/2022 and history of brachytherapy. -Patient seen in consultation by urology and patient started on continuous bladder irrigation with improvement with hematuria. -Eliquis initially discontinued and resumed at a lower dose of 2.5 mg twice daily on 12/30/2022. -CBI is discontinued today. -Patient seen in consultation by urology, Dr. Abner Greenspan who feels likely  from radiation cystitis from prior brachytherapy. -Patient with history of localized prostate cancer followed by urology in the outpatient setting last seen in 2016. -Urine cultures with Enterococcus, IV Rocephin discontinued and patient started on amoxicillin on 12/29/2022. -Voiding trial attempted 01/01/2023 however patient failed and Foley catheter had to be placed back in. -Urology recommended continuation of Foley catheter with voiding trial to be done in the outpatient setting, will need further outpatient evaluation to rule out underlying bladder mass. -Urology following and appreciate input and recommendations.  2.  New onset A-fib Cha2ds2vasc score 5.  Newly diagnosed in January 2024. -Patient initially seen by cardiology started on Eliquis 5 mg twice daily on 10/28/2022. -Dr. Zigmund  discussed with cardiology, Dr. Acie Fredrickson and it was recommended that patient may be restarted at a lower dose of Eliquis 2.5 mg twice daily due to age above 5, elevated creatinine once cleared by urology. -Continue Lopressor for rate control. -Outpatient follow-up.  3.  CAD/status post TAVR bioprosthetic valve February 2023 -Stable. -Outpatient follow-up.  4.  Hypertension -Continue hydralazine, Lopressor.    5.  Gout -Allopurinol.  6.  Hyperlipidemia -Statin.  7.  Peripheral neuropathy -Neurontin.   8.  Chronic diastolic CHF/volume overload -Patient was to be discharged yesterday, 3/28,  however noted to be hypoxic.   -Chest x-ray done/workup concerning for volume overload likely secondary to holding patient's diuretics on admission with fluid resuscitation.   -BNP noted to be elevated at 2982.8 today.   -Patient received Lasix 40 mg IV x 1 on 01/01/2023 with urine output of 2.775 L over the past 24 hours.   -Still volume overloaded on examination.   -Placed on Lasix 20 mg IV every 12 hours x 2 doses, reassess in the a.m. and could likely be  placed back on home regimen of oral Lasix 40 mg  daily. -Continue Lopressor, hydralazine.   9.  AKI -Status post Foley catheter placement, with improvement with renal function, creatinine currently at 1.07 today from as high as 1.81 on 12/27/2022. -Patient on diuretics. -Repeat labs in the AM.  10.  Acute on chronic anemia of chronic disease -Hemoglobin noted as low as 7.5 from 8.9 on admission.  Likely secondary to gross hematuria. -Status post transfusion 1 unit packed red blood cells hemoglobin currently stabilized at 9.0.  -Repeat labs in the AM.  11.  Delirium/sundowning -Continue Seroquel.    DVT prophylaxis: Eliquis Code Status: DNR Family Communication: Updated patient.  Updated son at bedside.  Disposition: Likely home once hypoxia has improved hopefully in the next 24 to 48 hours.  Status is: Inpatient Remains inpatient appropriate because: Severity of illness   Consultants:  Urology: Dr. Abner Greenspan 12/27/2022  Procedures:  CT renal stone protocol 12/27/2022 Transfusion 1 unit packed red blood cells 12/30/2022  Antimicrobials:  Amoxicillin 12/29/2022>>>> IV Rocephin 12/27/2022>>> 12/29/2022   Subjective: Patient laying in bed.  Foley catheter with clear amber-colored urine.  No chest pain.  No shortness of breath.  No abdominal pain.  States feels better today than he did yesterday.  Son at bedside.   Objective: Vitals:   01/02/23 0500 01/02/23 0635 01/02/23 1020 01/02/23 1026  BP:  (!) 140/68    Pulse:  81    Resp:  18    Temp:  97.8 F (36.6 C)    TempSrc:  Oral    SpO2:  96% (!) 89% 95%  Weight: 68.3 kg     Height:        Intake/Output Summary (Last 24 hours) at 01/02/2023 1206 Last data filed at 01/02/2023 0900 Gross per 24 hour  Intake 240 ml  Output 3175 ml  Net -2935 ml    Filed Weights   12/27/22 2210 01/01/23 1907 01/02/23 0500  Weight: 65 kg 68.8 kg 68.3 kg    Examination:  General exam: NAD. Respiratory system: Some scattered diffuse crackles.  No wheezing.  Fair air movement.  Speaking in  full sentences.  Cardiovascular system: Irregularly irregular.  No murmurs rubs or gallops.  No significant lower extremity.edema Gastrointestinal system: Abdomen is nondistended, soft and nontender. No organomegaly or masses felt. Normal bowel sounds heard. Central nervous system: Alert and oriented. No focal neurological deficits. Extremities: Symmetric 5 x 5 power. Skin: No rashes, lesions or ulcers Psychiatry: Judgement and insight appear normal. Mood & affect appropriate.     Data Reviewed: I have personally reviewed following labs and imaging studies  CBC: Recent Labs  Lab 12/27/22 1340 12/27/22 2226 12/29/22 0354 12/29/22 1036 12/31/22 0444 12/31/22 1045 12/31/22 1624 01/01/23 0440 01/02/23 0400  WBC 6.6  --  11.2*  --   --   --   --  7.6 7.0  NEUTROABS  --   --   --   --   --   --   --   --  5.4  HGB 8.9*   < > 8.2*   < > 8.4* 8.8* 9.2* 8.7* 9.0*  HCT 28.6*   < > 27.0*   < > 27.2* 28.2* 30.8* 27.8* 29.9*  MCV 91.7  --  94.7  --   --   --   --  93.0 93.7  PLT 152  --  154  --   --   --   --  134* 150   < > =  values in this interval not displayed.     Basic Metabolic Panel: Recent Labs  Lab 12/28/22 0356 12/29/22 0354 12/31/22 1045 01/01/23 0440 01/02/23 0400  NA 141 141 141 140 141  K 4.3 4.7 4.1 4.0 3.8  CL 110 107 110 109 107  CO2 23 22 23 23 26   GLUCOSE 97 122* 83 82 93  BUN 44* 36* 30* 25* 23  CREATININE 1.43* 1.50* 1.15 1.07 1.07  CALCIUM 8.8* 9.0 8.6* 8.5* 8.8*  MG  --   --  2.3  --  2.2     GFR: Estimated Creatinine Clearance: 36.1 mL/min (by C-G formula based on SCr of 1.07 mg/dL).  Liver Function Tests: Recent Labs  Lab 12/29/22 0354  AST 24  ALT 17  ALKPHOS 41  BILITOT 1.5*  PROT 6.7  ALBUMIN 4.0     CBG: No results for input(s): "GLUCAP" in the last 168 hours.   Recent Results (from the past 240 hour(s))  Urine Culture     Status: Abnormal   Collection Time: 12/27/22  4:27 PM   Specimen: Urine, Clean Catch  Result Value  Ref Range Status   Specimen Description   Final    URINE, CLEAN CATCH Performed at [facility], [address], [place], [place] [address]    Special Requests   Final    NONE Performed at [facility], [address] [place], [place] [address]    Culture 60,000 COLONIES/mL ENTEROCOCCUS FAECALIS (A)  Final   Report Status 12/30/2022 FINAL  Final   Organism ID, Bacteria ENTEROCOCCUS FAECALIS (A)  Final      Susceptibility   Enterococcus faecalis - MIC*    AMPICILLIN <=2 SENSITIVE Sensitive     NITROFURANTOIN <=16 SENSITIVE Sensitive     VANCOMYCIN 1 SENSITIVE Sensitive     * 60,000 COLONIES/mL ENTEROCOCCUS FAECALIS         Radiology Studies: DG CHEST PORT 1 VIEW  Result Date: 01/01/2023 CLINICAL DATA:  Shortness of breath and hypoxia EXAM: PORTABLE CHEST 1 VIEW COMPARISON:  11/08/2021 FINDINGS: Cardiac shadow is enlarged in size but stable. Changes of prior TAVR are noted. Aortic calcifications are seen. The lungs are well aerated bilaterally. Small left pleural effusion is noted. Mild central vascular congestion is seen. No bony abnormality is noted. IMPRESSION: Mild vascular congestion with small left effusion. No other focal abnormality is noted. Electronically Signed   By: Inez Catalina M.D.   On: 01/01/2023 19:37        Scheduled Meds:  amoxicillin  500 mg Oral TID   apixaban  2.5 mg Oral BID   Chlorhexidine Gluconate Cloth  6 each Topical Daily   furosemide  20 mg Intravenous Q12H   gabapentin  600 mg Oral QHS   hydrALAZINE  25 mg Oral BID   metoprolol tartrate  25 mg Oral BID   QUEtiapine  25 mg Oral QHS   Continuous Infusions:  sodium chloride irrigation Stopped (12/31/22 0800)     LOS: 6 days    Time spent: 40 minutes    Irine Seal, MD [facility]   To contact the attending provider between 7A-7P or the covering provider during after hours 7P-7A, please log into the web site [email or web address] and access  using universal [place] password for that web site. If you do not have the password, please call the hospital operator.  01/02/2023, 12:06 PM

## 2023-01-02 NOTE — Progress Notes (Signed)
SATURATION QUALIFICATIONS: (This note is used to comply with regulatory documentation for home oxygen)  Patient Saturations on Room Air at Rest = 89%  Patient Saturations on Room Air while Ambulating = 81%  Patient Saturations on 2 Liters of oxygen while Ambulating = 93%  Please briefly explain why patient needs home oxygen: desaturation at rest and while ambulating.

## 2023-01-02 NOTE — Progress Notes (Signed)
OT Cancellation Note  Patient Details Name: Paul Mays MRN: [id number] DOB: 1928-03-13   Cancelled Treatment:    Reason Eval/Treat Not Completed: Other (comment): Attempted x 2 in AM. Earlier pt refused. Returned at 11:47 and pt meeting with son and nurse. Will continue efforts as time allows.   Julien Girt 01/02/2023, 12:47 PM

## 2023-01-02 NOTE — Progress Notes (Signed)
Physical Therapy Treatment Patient Details Name: Paul Mays MRN: [id number] DOB: [birth year] Today's Date: 01/02/2023   History of Present Illness 89 yo male admitted on 12/27/22 with gross hematuria, Afib. Pt was discharging on 01/01/23 but had a drop in O2 sats. Hx of CAD, cervicalgia, polyneuropathy, venous insufficiency, pulm HTN, Afib, gout, CKD, TAVR, prostate Ca, anemia, HF    PT Comments    Pt making gradual progress. He was able to progress to 83' ambulation with RW and with practice and cues improved sit to stand with min guard assist.  Pt did require O2 in order to maintain sats earlier with nursing so kept on 2 L during PT session.  Continue POC to advance while hospitalized.     Recommendations for follow up therapy are one component of a multi-disciplinary discharge planning process, led by the attending physician.  Recommendations may be updated based on patient status, additional functional criteria and insurance authorization.         Assistance Recommended at Discharge Frequent or constant Supervision/Assistance  Patient can return home with the following Help with stairs or ramp for entrance;Assist for transportation;Assistance with cooking/housework;A little help with walking and/or transfers;A little help with bathing/dressing/bathroom   Equipment Recommendations  Rolling walker (2 wheels)    Recommendations for Other Services       Precautions / Restrictions Precautions Precautions: Fall Restrictions Weight Bearing Restrictions: No     Mobility  Bed Mobility               General bed mobility comments: in chair at arrival    Transfers Overall transfer level: Needs assistance Equipment used: Rolling walker (2 wheels) Transfers: Sit to/from Stand Sit to Stand: Min assist, Min guard           General transfer comment: Min A progressed to min guard from recliner.  Sit to stand x 7 throughout session.  Required cues for hand placement, scooting  forward, and use of momentum to rise    Ambulation/Gait Ambulation/Gait assistance: Min guard Gait Distance (Feet): 80 Feet Assistive device: Rolling walker (2 wheels) Gait Pattern/deviations: Step-through pattern, Decreased stride length Gait velocity: decreased     General Gait Details: Min cues for RW use and breathing in through nose.  Pt was on 2 L O2 with sats 92% first 35' but then 88% upon return to room.  1 minute rest to recover.   Stairs             Wheelchair Mobility    Modified Rankin (Stroke Patients Only)       Balance Overall balance assessment: Needs assistance Sitting-balance support: No upper extremity supported Sitting balance-Leahy Scale: Good     Standing balance support: Bilateral upper extremity supported, No upper extremity supported Standing balance-Leahy Scale: Fair Standing balance comment: RW to ambulate but could static stand without support                            Cognition Arousal/Alertness: Awake/alert Behavior During Therapy: WFL for tasks assessed/performed Overall Cognitive Status: Within Functional Limits for tasks assessed                                          Exercises Other Exercises Other Exercises: Sit to stand x 5; standing marching x 5 wtih RW; reports fatigue    General Comments General  comments (skin integrity, edema, etc.): Pt on 2 L O2 with sats 95% rest and 88% ambulation.  RN had attempted RA with pt earlier and pt dropped to 81% so did not attempt with therapy.      Pertinent Vitals/Pain Pain Assessment Pain Assessment: No/denies pain    Home Living                          Prior Function            PT Goals (current goals can now be found in the care plan section) Progress towards PT goals: Progressing toward goals    Frequency    Min 3X/week      PT Plan Current plan remains appropriate    Co-evaluation              AM-PAC PT "6  Clicks" Mobility   Outcome Measure  Help needed turning from your back to your side while in a flat bed without using bedrails?: A Little Help needed moving from lying on your back to sitting on the side of a flat bed without using bedrails?: A Little Help needed moving to and from a bed to a chair (including a wheelchair)?: A Little Help needed standing up from a chair using your arms (e.g., wheelchair or bedside chair)?: A Little Help needed to walk in hospital room?: A Little Help needed climbing 3-5 steps with a railing? : A Lot 6 Click Score: 17    End of Session Equipment Utilized During Treatment: Gait belt Activity Tolerance: Patient tolerated treatment well Patient left: with call bell/phone within reach;with chair alarm set;in chair Nurse Communication: Mobility status PT Visit Diagnosis: Muscle weakness (generalized) (M62.81);Difficulty in walking, not elsewhere classified (R26.2)     Time: 1210-1224 PT Time Calculation (min) (ACUTE ONLY): 14 min  Charges:                        Abran Richard, PT Acute Rehab [facility] [phone]    Karlton Lemon 01/02/2023, 1:11 PM

## 2023-01-03 DIAGNOSIS — I10 Essential (primary) hypertension: Secondary | ICD-10-CM | POA: Diagnosis not present

## 2023-01-03 DIAGNOSIS — D649 Anemia, unspecified: Secondary | ICD-10-CM | POA: Diagnosis not present

## 2023-01-03 DIAGNOSIS — I25810 Atherosclerosis of coronary artery bypass graft(s) without angina pectoris: Secondary | ICD-10-CM | POA: Diagnosis not present

## 2023-01-03 DIAGNOSIS — G4733 Obstructive sleep apnea (adult) (pediatric): Secondary | ICD-10-CM | POA: Diagnosis not present

## 2023-01-03 LAB — CBC
HCT: 30.7 % — ABNORMAL LOW (ref 39.0–52.0)
Hemoglobin: 9.5 g/dL — ABNORMAL LOW (ref 13.0–17.0)
MCH: 28.4 pg (ref 26.0–34.0)
MCHC: 30.9 g/dL (ref 30.0–36.0)
MCV: 91.6 fL (ref 80.0–100.0)
Platelets: 170 10*3/uL (ref 150–400)
RBC: 3.35 MIL/uL — ABNORMAL LOW (ref 4.22–5.81)
RDW: 14.8 % (ref 11.5–15.5)
WBC: 6.1 10*3/uL (ref 4.0–10.5)
nRBC: 0 % (ref 0.0–0.2)

## 2023-01-03 LAB — BASIC METABOLIC PANEL
Anion gap: 9 (ref 5–15)
BUN: 21 mg/dL (ref 8–23)
CO2: 28 mmol/L (ref 22–32)
Calcium: 8.6 mg/dL — ABNORMAL LOW (ref 8.9–10.3)
Chloride: 102 mmol/L (ref 98–111)
Creatinine, Ser: 1.06 mg/dL (ref 0.61–1.24)
GFR, Estimated: >60 mL/min (ref >60)
Glucose, Bld: 81 mg/dL (ref 70–99)
Potassium: 3.4 mmol/L — ABNORMAL LOW (ref 3.5–5.1)
Sodium: 139 mmol/L (ref 135–145)

## 2023-01-03 LAB — MAGNESIUM: Magnesium: 2.2 mg/dL (ref 1.7–2.4)

## 2023-01-03 MED ORDER — POTASSIUM CHLORIDE CRYS ER 10 MEQ PO TBCR
40.0000 meq | EXTENDED_RELEASE_TABLET | Freq: Once | ORAL | Status: AC
  Administered 2023-01-03: 40 meq via ORAL
  Filled 2023-01-03: qty 4

## 2023-01-03 MED ORDER — ORAL CARE MOUTH RINSE: 15.0000 mL | OROMUCOSAL | Status: DC | PRN

## 2023-01-03 MED ORDER — FUROSEMIDE 10 MG/ML IJ SOLN
20.0000 mg | Freq: Two times a day (BID) | INTRAMUSCULAR | Status: AC
  Administered 2023-01-03 (×2): 20 mg via INTRAVENOUS
  Filled 2023-01-03 (×2): qty 2

## 2023-01-03 NOTE — Progress Notes (Addendum)
PROGRESS NOTE    Paul Mays  [id number] DOB: 03-30-1928 DOA: 12/27/2022 PCP: Binnie Rail, MD    Chief Complaint  Patient presents with   Hematuria    Brief Narrative:  89 year old male with past medical history significant for pulmonary hypertension, atrial fibrillation on Eliquis,HTN, HLD, migraine, CAD, gout, CKD stage III, transcatheter aortic valve replacement, and a remote h/o history of prostate cancer.  He is admitted with gross hematuria.  Patient was started on Eliquis on 10/28/2022 for new onset atrial fibrillation. He underwent brachytherapy in 2000 CT renal stone study-12/27/2022-5 cm lobulated density within the bladder, which could represent blood clot or bladder neoplasm. Consider cystoscopy for further evaluation.Tiny nonobstructing left renal calculus. No evidence of ureteral calculi or hydronephrosis.Cholelithiasis. No radiographic evidence of cholecystitis.Large left scrotal hydrocele.Stable small bilateral pleural effusions. Patient seen in consultation by urology, CBI is done, improvement with hematuria.   Assessment & Plan:   Principal Problem:   Gross hematuria Active Problems:   Hyperlipidemia   Gout   Essential hypertension   Severe aortic stenosis   OBSTRUCTIVE SLEEP APNEA   Fall   CAD (coronary artery disease)   Prostate cancer (HCC)   Acute cystitis with hematuria   Paroxysmal atrial fibrillation (HCC)   Chronic diastolic CHF (congestive heart failure) ([place])   Hypoxia  #1 gross hematuria/Enterococcus UTI -Patient was then with gross hematuria in the setting of recently started Eliquis 10/28/2022 and history of brachytherapy. -Patient seen in consultation by urology and patient started on continuous bladder irrigation with improvement with hematuria. -Eliquis initially discontinued and resumed at a lower dose of 2.5 mg twice daily on 12/30/2022. -CBI is discontinued today. -Patient seen in consultation by urology, Dr. Abner Greenspan who feels likely  from radiation cystitis from prior brachytherapy. -Patient with history of localized prostate cancer followed by urology in the outpatient setting last seen in 2016. -Urine cultures with Enterococcus, IV Rocephin discontinued and patient started on amoxicillin on 12/29/2022. -Voiding trial attempted 01/01/2023 however patient failed and Foley catheter had to be placed back in. -Urology recommended continuation of Foley catheter with voiding trial to be done in the outpatient setting, will need further outpatient evaluation to rule out underlying bladder mass. -Urology following and appreciate input and recommendations.  2.  New onset A-fib Cha2ds2vasc score 5.  Newly diagnosed in January 2024. -Patient initially seen by cardiology started on Eliquis 5 mg twice daily on 10/28/2022. -Dr. Zigmund  discussed with cardiology, Dr. Acie Fredrickson and it was recommended that patient may be restarted at a lower dose of Eliquis 2.5 mg twice daily due to age above 34, elevated creatinine once cleared by urology. -Continue Lopressor for rate control. -Outpatient follow-up.  3.  CAD/status post TAVR bioprosthetic valve February 2023 -Stable. -Outpatient follow-up.  4.  Hypertension -Currently controlled on hydralazine, Lopressor, IV Lasix.    5.  Gout -Allopurinol.  6.  Hyperlipidemia -Statin.  7.  Peripheral neuropathy -Neurontin.  8.  Acute on chronic diastolic CHF/volume overload -Patient was to be discharged yesterday, 3/28,  however noted to be hypoxic.   -Chest x-ray done/workup concerning for volume overload likely secondary to holding patient's diuretics on admission with fluid resuscitation.   -BNP noted to be elevated at 2982.8.   -Patient received Lasix 40 mg IV x 1 on 01/01/2023 with urine output of 2.775 L over the past 24 hours.   -Still volume overloaded on examination.   -Patient received Lasix 20 mg IV every 12 hours on 01/02/2023 with urine output of 3.45  L over the past 24  hours. -Patient improving clinically, some bibasilar crackles. -Hypoxia improving however noted to be 95% on room air at rest, 85% with ambulation, 94% on 2 L O2 with ambulation. -Continue Lasix 20 mg IV every 12 hours x 2 doses, reassess in the a.m. and check ambulatory sats in the AM. -Continue Lopressor, hydralazine.   9.  AKI -Status post Foley catheter placement, with improvement with renal function, creatinine currently at 1.06 today from as high as 1.81 on 12/27/2022. -Patient on diuretics. -Repeat labs in the AM.  10.  Acute on chronic anemia of chronic disease -Hemoglobin noted as low as 7.5 from 8.9 on admission.  Likely secondary to gross hematuria. -Status post transfusion 1 unit packed red blood cells hemoglobin currently stabilized at 9.0.  -Repeat labs in the AM.  11.  Delirium/sundowning -Seroquel.  12.  Hypokalemia -Secondary to diuresis. -Replete.    DVT prophylaxis: Eliquis Code Status: DNR Family Communication: Updated patient.  Updated son at bedside.  Disposition: Likely home once hypoxia has improved hopefully in the next 24 to 48 hours.  Status is: Inpatient Remains inpatient appropriate because: Severity of illness   Consultants:  Urology: Dr. Abner Greenspan 12/27/2022  Procedures:  CT renal stone protocol 12/27/2022 Transfusion 1 unit packed red blood cells 12/30/2022  Antimicrobials:  Amoxicillin 12/29/2022>>>> IV Rocephin 12/27/2022>>> 12/29/2022   Subjective: Sitting up in recliner.  Denies any significant shortness of breath or chest pain.  No abdominal pain.  Overall feels well.  Asking whether he can be discharged today.  Hypoxia improving.  Objective: Vitals:   01/03/23 0817 01/03/23 1015 01/03/23 1020 01/03/23 1024  BP: (!) 152/71     Pulse: (!) 59     Resp:  15 (!) 23 18  Temp:      TempSrc:      SpO2:  95% (!) 85% 95%  Weight:      Height:        Intake/Output Summary (Last 24 hours) at 01/03/2023 1057 Last data filed at 01/03/2023  0900 Gross per 24 hour  Intake 360 ml  Output 3550 ml  Net -3190 ml    Filed Weights   01/01/23 1907 01/02/23 0500 01/03/23 0500  Weight: 68.8 kg 68.3 kg 64.1 kg    Examination:  General exam: NAD. Respiratory system: Bibasilar crackles.  No wheezes, no rhonchi.  Fair air movement.  Speaking in full sentences.  Cardiovascular system: Irregularly irregular.  No murmurs rubs or gallops.  No significant lower extremity edema.  Gastrointestinal system: Abdomen is soft, nontender, nondistended, positive bowel sounds.  No rebound.  No guarding. Central nervous system: Alert and oriented. No focal neurological deficits. Extremities: Symmetric 5 x 5 power. Skin: No rashes, lesions or ulcers Psychiatry: Judgement and insight appear normal. Mood & affect appropriate.     Data Reviewed: I have personally reviewed following labs and imaging studies  CBC: Recent Labs  Lab 12/27/22 1340 12/27/22 2226 12/29/22 0354 12/29/22 1036 12/31/22 1045 12/31/22 1624 01/01/23 0440 01/02/23 0400 01/03/23 0425  WBC 6.6  --  11.2*  --   --   --  7.6 7.0 6.1  NEUTROABS  --   --   --   --   --   --   --  5.4  --   HGB 8.9*   < > 8.2*   < > 8.8* 9.2* 8.7* 9.0* 9.5*  HCT 28.6*   < > 27.0*   < > 28.2* 30.8* 27.8* 29.9* 30.7*  MCV 91.7  --  94.7  --   --   --  93.0 93.7 91.6  PLT 152  --  154  --   --   --  134* 150 170   < > = values in this interval not displayed.     Basic Metabolic Panel: Recent Labs  Lab 12/29/22 0354 12/31/22 1045 01/01/23 0440 01/02/23 0400 01/03/23 0425  NA 141 141 140 141 139  K 4.7 4.1 4.0 3.8 3.4*  CL 107 110 109 107 102  CO2 22 23 23 26 28   GLUCOSE 122* 83 82 93 81  BUN 36* 30* 25* 23 21  CREATININE 1.50* 1.15 1.07 1.07 1.06  CALCIUM 9.0 8.6* 8.5* 8.8* 8.6*  MG  --  2.3  --  2.2 2.2     GFR: Estimated Creatinine Clearance: 36.5 mL/min (by C-G formula based on SCr of 1.06 mg/dL).  Liver Function Tests: Recent Labs  Lab 12/29/22 0354  AST 24  ALT  17  ALKPHOS 41  BILITOT 1.5*  PROT 6.7  ALBUMIN 4.0     CBG: No results for input(s): "GLUCAP" in the last 168 hours.   Recent Results (from the past 240 hour(s))  Urine Culture     Status: Abnormal   Collection Time: 12/27/22  4:27 PM   Specimen: Urine, Clean Catch  Result Value Ref Range Status   Specimen Description   Final    URINE, CLEAN CATCH Performed at [facility], [address] [place], [place] [address]    Special Requests   Final    NONE Performed at [facility], [address], [place], [place] [address]    Culture 60,000 COLONIES/mL ENTEROCOCCUS FAECALIS (A)  Final   Report Status 12/30/2022 FINAL  Final   Organism ID, Bacteria ENTEROCOCCUS FAECALIS (A)  Final      Susceptibility   Enterococcus faecalis - MIC*    AMPICILLIN <=2 SENSITIVE Sensitive     NITROFURANTOIN <=16 SENSITIVE Sensitive     VANCOMYCIN 1 SENSITIVE Sensitive     * 60,000 COLONIES/mL ENTEROCOCCUS FAECALIS         Radiology Studies: DG CHEST PORT 1 VIEW  Result Date: 01/01/2023 CLINICAL DATA:  Shortness of breath and hypoxia EXAM: PORTABLE CHEST 1 VIEW COMPARISON:  11/08/2021 FINDINGS: Cardiac shadow is enlarged in size but stable. Changes of prior TAVR are noted. Aortic calcifications are seen. The lungs are well aerated bilaterally. Small left pleural effusion is noted. Mild central vascular congestion is seen. No bony abnormality is noted. IMPRESSION: Mild vascular congestion with small left effusion. No other focal abnormality is noted. Electronically Signed   By: Inez Catalina M.D.   On: 01/01/2023 19:37        Scheduled Meds:  amoxicillin  500 mg Oral TID   apixaban  2.5 mg Oral BID   Chlorhexidine Gluconate Cloth  6 each Topical Daily   furosemide  20 mg Intravenous BID   gabapentin  600 mg Oral QHS   hydrALAZINE  25 mg Oral BID   metoprolol tartrate  25 mg Oral BID   QUEtiapine  25 mg Oral QHS   Continuous Infusions:  sodium  chloride irrigation Stopped (12/31/22 0800)     LOS: 7 days    Time spent: 40 minutes    Irine Seal, MD [facility]   To contact the attending provider between 7A-7P or the covering provider during after hours 7P-7A, please log into the web site [email or web address] and access using  universal [place] password for that web site. If you do not have the password, please call the hospital operator.  01/03/2023, 10:57 AM

## 2023-01-03 NOTE — Progress Notes (Signed)
SATURATION QUALIFICATIONS: (This note is used to comply with regulatory documentation for home oxygen)  Patient Saturations on Room Air at Rest = 95%  Patient Saturations on Room Air while Ambulating = 85%  Patient Saturations on 2 Liters of oxygen while Ambulating = 94%  Please briefly explain why patient needs home oxygen: desaturation with mobility

## 2023-01-03 NOTE — Progress Notes (Signed)
Mobility Specialist - Progress Note   01/03/23 1223  Oxygen Therapy  SpO2 (!) 88 %  O2 Device Nasal Cannula  O2 Flow Rate (L/min) 2 L/min  Patient Activity (if Appropriate) Ambulating  Mobility  Activity Ambulated with assistance in hallway  Level of Assistance Standby assist, set-up cues, supervision of patient - no hands on  Assistive Device Front wheel walker  Distance Ambulated (ft) 280 ft  Activity Response Tolerated well  Mobility Referral Yes  $Mobility charge 1 Mobility   Pt received in recliner and agreeable to mobility. Pt took 1x standing rest break due to O2 dropping to 88%. Encouraged pursed lip breaths but unable to obtain proper O2 reading. No complaints during session.  Pt to recliner after session with all needs met.    Pre-mobility: 77 HR, 97% SpO2 (2L [place]) During mobility: 89 HR, 88% SpO2 (2L [place]) Post-mobility: 79 HR, 91% SPO2 (2L [place])  Set designer

## 2023-01-04 DIAGNOSIS — N3001 Acute cystitis with hematuria: Secondary | ICD-10-CM | POA: Diagnosis not present

## 2023-01-04 DIAGNOSIS — I10 Essential (primary) hypertension: Secondary | ICD-10-CM | POA: Diagnosis not present

## 2023-01-04 DIAGNOSIS — I25810 Atherosclerosis of coronary artery bypass graft(s) without angina pectoris: Secondary | ICD-10-CM | POA: Diagnosis not present

## 2023-01-04 DIAGNOSIS — I5032 Chronic diastolic (congestive) heart failure: Secondary | ICD-10-CM | POA: Diagnosis not present

## 2023-01-04 LAB — BASIC METABOLIC PANEL
Anion gap: 8 (ref 5–15)
BUN: 21 mg/dL (ref 8–23)
CO2: 28 mmol/L (ref 22–32)
Calcium: 8.5 mg/dL — ABNORMAL LOW (ref 8.9–10.3)
Chloride: 102 mmol/L (ref 98–111)
Creatinine, Ser: 1.01 mg/dL (ref 0.61–1.24)
GFR, Estimated: >60 mL/min (ref >60)
Glucose, Bld: 82 mg/dL (ref 70–99)
Potassium: 3.7 mmol/L (ref 3.5–5.1)
Sodium: 138 mmol/L (ref 135–145)

## 2023-01-04 MED ORDER — POLYSACCHARIDE IRON COMPLEX 150 MG PO CAPS
150.0000 mg | ORAL_CAPSULE | Freq: Every day | ORAL | Status: DC
  Administered 2023-01-04: 150 mg via ORAL
  Filled 2023-01-04: qty 1

## 2023-01-04 MED ORDER — VITAMIN B-12 100 MCG PO TABS
500.0000 ug | ORAL_TABLET | Freq: Every day | ORAL | Status: DC
  Administered 2023-01-04: 500 ug via ORAL
  Filled 2023-01-04: qty 5

## 2023-01-04 MED ORDER — POTASSIUM CHLORIDE CRYS ER 10 MEQ PO TBCR
10.0000 meq | EXTENDED_RELEASE_TABLET | Freq: Every day | ORAL | Status: DC
  Administered 2023-01-04: 10 meq via ORAL
  Filled 2023-01-04: qty 1

## 2023-01-04 MED ORDER — POTASSIUM CHLORIDE CRYS ER 10 MEQ PO TBCR
20.0000 meq | EXTENDED_RELEASE_TABLET | Freq: Once | ORAL | Status: AC
  Administered 2023-01-04: 20 meq via ORAL
  Filled 2023-01-04: qty 2

## 2023-01-04 MED ORDER — VITAMIN D 25 MCG (1000 UNIT) PO TABS
2000.0000 [IU] | ORAL_TABLET | Freq: Every day | ORAL | Status: DC
  Administered 2023-01-04: 2000 [IU] via ORAL
  Filled 2023-01-04: qty 2

## 2023-01-04 MED ORDER — FUROSEMIDE 40 MG PO TABS
40.0000 mg | ORAL_TABLET | Freq: Every day | ORAL | Status: DC
  Administered 2023-01-04: 40 mg via ORAL
  Filled 2023-01-04: qty 1

## 2023-01-04 MED ORDER — FUROSEMIDE 10 MG/ML IJ SOLN
20.0000 mg | Freq: Once | INTRAMUSCULAR | Status: AC
  Administered 2023-01-04: 20 mg via INTRAVENOUS
  Filled 2023-01-04: qty 2

## 2023-01-04 NOTE — NC FL2 (Signed)
[name] LEVEL OF CARE FORM     IDENTIFICATION  Patient Name: Paul Mays Birthdate: 30-Aug-1928 Sex: male Admission Date (Current Location): 12/27/2022  [facility] and [place] Number:  Herbalist and Address:  [facility],  [place], [place]      Provider Number: [id number]  Attending Physician Name and Address:  Eugenie Filler, MD  Relative Name and Phone Number:  Alter Supino [phone]    Current Level of Care: Hospital Recommended Level of Care: [place] Prior Approval Number:    Date Approved/Denied: 01/04/23 PASRR Number: [id number]  Discharge Plan: SNF    Current Diagnoses: Patient Active Problem List   Diagnosis Date Noted   Hypoxia 01/01/2023   Acute cystitis with hematuria 12/31/2022   Paroxysmal atrial fibrillation ([place]) 12/31/2022   Chronic diastolic CHF (congestive heart failure) ([place]) 12/31/2022   Gross hematuria 12/27/2022   Multiple skin tears 11/29/2022   Chronic kidney disease, stage 3 ([place]) 11/10/2022   Arthritis 11/10/2022   Headache 11/10/2022   Herpes zoster 11/10/2022   Peripheral edema 11/10/2022   Ulcerative colitis ([place]) 11/10/2022   CAD (coronary artery disease) 11/10/2022   Neuropathy 11/10/2022   Obstructive sleep apnea 11/10/2022   Prostate cancer ([place]) 11/10/2022   Acquired trigger finger of right little finger 08/19/2022   Acquired trigger finger of right middle finger 08/19/2022   Anemia 07/22/2022   Chronic neck pain 06/10/2022   Trigger finger, right middle finger 04/01/2022   Trigger little finger of right hand 03/27/2022   S/P TAVR (transcatheter aortic valve replacement) 11/12/2021   Dyspnea on exertion 08/05/2021   Acute on chronic diastolic heart failure ([place]) 08/05/2021   Lower extremity ulceration ([place]) 03/08/2021   Weight loss 02/19/2021   Lightheaded [id number]   Periumbilical pain 123XX123   Fall 08/21/2020   Head trauma, initial  encounter 08/21/2020   Bilateral leg edema 01/26/2020   GERD (gastroesophageal reflux disease) 01/26/2020   Chronic pain of both shoulders 12/10/2018   Easy bruising 03/20/2018   Decreased hearing of right ear 09/04/2017   Rotator cuff tear arthropathy of both shoulders 10/18/2015   Thrombocytopenia ([place]) 09/24/2015   Diverticulosis of colon without hemorrhage 07/02/2014   Hypertrophic cardiomyopathy ([place]) 03/24/2011   OBSTRUCTIVE SLEEP APNEA 11/08/2010   HYPERTENSION, PULMONARY 09/16/2010   HEARING LOSS, BILATERAL 06/18/2009   Coronary atherosclerosis 03/07/2009   Severe aortic stenosis 03/07/2009   CAROTID STENOSIS 03/07/2009   Hyperlipidemia 11/15/2008   Peripheral neuropathy, idiopathic 11/15/2008   Essential hypertension 11/15/2008   COLITIS, HX OF 11/15/2008   VENOUS INSUFFICIENCY 04/20/2007   Gout 02/10/2007    Orientation RESPIRATION BLADDER Height & Weight     Time, Situation, Place, Self  Normal Continent Weight: 132 lb 11.5 oz (60.2 kg) Height:  5\' 4"  (162.6 cm)  BEHAVIORAL SYMPTOMS/MOOD NEUROLOGICAL BOWEL NUTRITION STATUS      Continent    AMBULATORY STATUS COMMUNICATION OF NEEDS Skin   Limited Assist Verbally Normal                       Personal Care Assistance Level of Assistance  Bathing, Feeding, Dressing Bathing Assistance: Limited assistance Feeding assistance: Independent Dressing Assistance: Limited assistance     Functional Limitations Info  Sight, Hearing, Speech Sight Info: Adequate Hearing Info: Adequate Speech Info: Adequate    SPECIAL CARE FACTORS FREQUENCY  Contractures      Additional Factors Info  Code Status, Allergies Code Status Info: DNR Allergies Info: Lisinopril High Contraindication Other (See Comments) Hyperkalemia in the context of combined spironolactone and lisinopril therapy Spironolactone High Contraindication Other (See Comments) Hyperkalemia in context of spironolactone and  lisinopril therapy           Current Medications (01/04/2023):  This is the current hospital active medication list Current Facility-Administered Medications  Medication Dose Route Frequency Provider Last Rate Last Admin   acetaminophen (TYLENOL) tablet 650 mg  650 mg Oral Q6H PRN Crosley, Debby, MD   650 mg at 01/02/23 1715   Or   acetaminophen (TYLENOL) suppository 650 mg  650 mg Rectal Q6H PRN Crosley, Debby, MD       amoxicillin (AMOXIL) capsule 500 mg  500 mg Oral TID Eugenie Filler, MD   500 mg at 01/04/23 [id number]   apixaban (ELIQUIS) tablet 2.5 mg  2.5 mg Oral BID Georgette Shell, MD   2.5 mg at 01/04/23 0920   Chlorhexidine Gluconate Cloth 2 % PADS 6 each  6 each Topical Daily Georgette Shell, MD   6 each at 01/04/23 [id number]   cholecalciferol (VITAMIN D3) 25 MCG (1000 UNIT) tablet 2,000 Units  2,000 Units Oral Daily Eugenie Filler, MD   2,000 Units at 01/04/23 [id number]   furosemide (LASIX) tablet 40 mg  40 mg Oral Daily Eugenie Filler, MD   40 mg at 01/04/23 0925   gabapentin (NEURONTIN) capsule 600 mg  600 mg Oral QHS Crosley, Debby, MD   600 mg at 01/03/23 2114   hydrALAZINE (APRESOLINE) tablet 25 mg  25 mg Oral BID Crosley, Debby, MD   25 mg at 01/04/23 0920   HYDROmorphone (DILAUDID) injection 0.5 mg  0.5 mg Intravenous Q3H PRN Crosley, Debby, MD   0.5 mg at 12/31/22 0359   iron polysaccharides (NIFEREX) capsule 150 mg  150 mg Oral Daily Eugenie Filler, MD   150 mg at 01/04/23 0925   metoprolol tartrate (LOPRESSOR) tablet 25 mg  25 mg Oral BID Crosley, Debby, MD   25 mg at 01/04/23 0920   ondansetron (ZOFRAN) tablet 4 mg  4 mg Oral Q6H PRN Crosley, Debby, MD       Or   ondansetron (ZOFRAN) injection 4 mg  4 mg Intravenous Q6H PRN Crosley, Debby, MD       Oral care mouth rinse  15 mL Mouth Rinse PRN Eugenie Filler, MD       oxybutynin (DITROPAN) tablet 10 mg  10 mg Oral Q8H PRN Ceasar Mons, MD   10 mg at 12/30/22 1540   potassium chloride  (KLOR-CON M) CR tablet 10 mEq  10 mEq Oral Daily Eugenie Filler, MD   10 mEq at 01/04/23 [id number]   QUEtiapine (SEROQUEL) tablet 25 mg  25 mg Oral QHS Georgette Shell, MD   25 mg at 01/03/23 2114   senna-docusate (Senokot-S) tablet 1 tablet  1 tablet Oral QHS PRN Crosley, Debby, MD       sodium chloride irrigation 0.9 % 3,000 mL  3,000 mL Irrigation Continuous Janith Lima, MD   Stopped at 12/31/22 0800   vitamin B-12 (CYANOCOBALAMIN) tablet 500 mcg  500 mcg Oral Daily Eugenie Filler, MD   500 mcg at 01/04/23 [id number]     Discharge Medications: Please see discharge summary for a list of discharge medications.  Relevant Imaging Results:  Relevant Lab Results:   Additional  Information Patient SSN [id number]  Rodney Booze, [place]

## 2023-01-04 NOTE — Discharge Summary (Signed)
Physician Discharge Summary  Paul Mays [id number] DOB: 1927/11/15 DOA: 12/27/2022  PCP: Binnie Rail, MD  Admit date: 12/27/2022 Discharge date: 01/04/2023  Time spent: 60 minutes  Recommendations for Outpatient Follow-up:  [facility] 1 to 2 weeks.  On follow-up will need a voiding trial and outpatient evaluation to rule out bladder mass. Follow-up with Binnie Rail, MD in 1 week.  On follow-up patient needs a basic metabolic profile done to follow-up on electrolytes and renal function.  Patient's acute on chronic diastolic CHF will need to be followed up upon.  Patient need a CBC done to follow-up on H&H.    Discharge Diagnoses:  Principal Problem:   Gross hematuria Active Problems:   Hyperlipidemia   Gout   Essential hypertension   Severe aortic stenosis   OBSTRUCTIVE SLEEP APNEA   Fall   CAD (coronary artery disease)   Prostate cancer (HCC)   Acute cystitis with hematuria   Paroxysmal atrial fibrillation (HCC)   Chronic diastolic CHF (congestive heart failure) ([employer])   Hypoxia   Discharge Condition: Stable and improved.  Diet recommendation: Heart healthy  Filed Weights   01/02/23 0500 01/03/23 0500 01/04/23 0441  Weight: 68.3 kg 64.1 kg 60.2 kg    History of present illness:  HPI per Dr Claria Dice This is a 89 year old male with past medical history significant for pulmonary hypertension, atrial fibrillation on Eliquis,  HTN, HLD, migraine, CAD, gout, CKD stage III, transcatheter aortic valve replacement, and a remote h/o history of prostate cancer.  Patient states he got up this morning and noted he was peeing bright red blood.  He came to the ER. Per patient he was started on Eliquis 2 weeks ago for new onset atrial fibrillation.  For the past week he has noted stinging whenever he pees.  He denies foul-smelling urine, fevers or chills.  He has a history of prostate cancer.  He has been in remission over 10 years.   In the ER patient  with gross hematuria.  CT abdomen pelvis revealed: 5 cm lobulated density within the bladder, which could represent blood clot or bladder neoplasm. Consider cystoscopy for further evaluation.  Urologist Dr. Abner Greenspan was contacted by EDP.  Hospital Course:  #1 gross hematuria/Enterococcus UTI -Patient was then with gross hematuria in the setting of recently started Eliquis 10/28/2022 and history of brachytherapy. -Patient seen in consultation by urology and patient started on continuous bladder irrigation with improvement with hematuria. -Eliquis initially discontinued and resumed at a lower dose of 2.5 mg twice daily on 12/30/2022. -CBI is discontinued today. -Patient seen in consultation by urology, Dr. Abner Greenspan who feels likely from radiation cystitis from prior brachytherapy. -Patient with history of localized prostate cancer followed by urology in the outpatient setting last seen in 2016. -Urine cultures with Enterococcus, IV Rocephin discontinued and patient started on amoxicillin on 12/29/2022. -Patient underwent voiding trial on day of discharge however failed and Foley catheter placed back in. -Urology recommended continuation of Foley catheter with voiding trial to be done in the outpatient setting, will need further outpatient evaluation to rule out underlying bladder mass. -Patient will be discharged in stable and improved condition.  Outpatient follow-up with urology.   2.  New onset A-fib Cha2ds2vasc score 5.  Newly diagnosed in January 2024. -Patient initially seen by cardiology started on Eliquis 5 mg twice daily on 10/28/2022. -Dr. Zigmund  discussed with cardiology, Dr. Acie Fredrickson and it was recommended that patient may be restarted at a lower dose  of Eliquis 2.5 mg twice daily due to age above 37, elevated creatinine once cleared by urology. -Outpatient follow-up with cardiology as previously scheduled..   3.  CAD/status post TAVR bioprosthetic valve February 2023 -Stable. -Outpatient  follow-up.   4.  Hypertension -Patient maintained on home regimen Lopressor, hydralazine.    5.  Gout -Remained stable.     6.  Hyperlipidemia -Patient maintained on home regimen statin.   7.  Peripheral neuropathy -Patient maintained on home regimen Neurontin.    8.  Acute on chronic diastolic CHF/volume overload -Patient was to be discharged 3/28,  however noted to be hypoxic.   -Chest x-ray done/workup concerning for volume overload likely secondary to holding patient's diuretics on admission with fluid resuscitation.   -BNP noted to be elevated at 2982.8.   -Patient received Lasix 40 mg IV x 1 on 01/01/2023 with urine output of 2.775 L -Patient received Lasix 20 mg IV every 12 hours on 01/02/2023 with urine output of 3.45 L  -Patient received Lasix 20 mg IV every 12 hours on 01/03/2023 with a urine output of 3.950 L. -Patient improved clinically, hypoxia improved and patient transition back to home regimen of Lasix 40 mg daily on day of discharge.  Patient also given Lasix 20 mg IV x 1 on day of discharge. -Patient maintained on home regimen Lopressor and hydralazine. -Outpatient follow-up with PCP and primary cardiologist as scheduled.   9.  AKI -Status post Foley catheter placement, with improvement with renal function, creatinine improved with hydration and holding diuretics and was down to 1.07 by day of discharge from as high as 1.81 on 12/27/2022. -Patient during the hospitalization noted to be volume overloaded, received IV Lasix with good urine output, renal function remained stable.   -Patient's home regimen diuretics will be resumed on discharge.  -Outpatient follow-up with PCP.  Will need lab work in 1 week.   10.  Acute on chronic anemia of chronic disease -Hemoglobin noted as low as 7.5 from 8.9 on admission.  Likely secondary to gross hematuria. -Status post transfusion 1 unit packed red blood cells hemoglobin stabilized at 9.5 by day of discharge.   -Outpatient  follow-up with PCP.     11.  Delirium/sundowning -Patient maintained on Seroquel at bedtime during the hospitalization and will be discharged on Seroquel 25 mg nightly as needed agitation.   -Outpatient follow-up with PCP.        Procedures: CT renal stone protocol 12/27/2022 Transfusion 1 unit packed red blood cells 12/30/2022  Consultations: Urology: Dr. Abner Greenspan 12/27/2022  Discharge Exam: Vitals:   01/04/23 1019 01/04/23 1227  BP:  99/61  Pulse:  63  Resp:  18  Temp:  98.9 F (37.2 C)  SpO2: 92% 96%    General: NAD Cardiovascular: RRR no murmurs rubs or gallops.  No JVD.  No lower extremity edema.  Respiratory: Minimal bibasilar crackles.  No wheezes.  No rhonchi.  Fair air movement.   Discharge Instructions   Discharge Instructions     Diet - low sodium heart healthy   Complete by: As directed    Increase activity slowly   Complete by: As directed       Allergies as of 01/04/2023       Reactions   Lisinopril Other (See Comments)   Hyperkalemia in the context of combined spironolactone and lisinopril therapy   Spironolactone Other (See Comments)   Hyperkalemia in context of spironolactone and lisinopril therapy        Medication List  TAKE these medications    acetaminophen 325 MG tablet Commonly known as: TYLENOL Take 2 tablets (650 mg total) by mouth every 6 (six) hours as needed for mild pain (or Fever >/= 101).   allopurinol 300 MG tablet Commonly known as: ZYLOPRIM TAKE 1 TABLET(300 MG) BY MOUTH DAILY AS NEEDED FOR GOUT FLARE What changed: See the new instructions.   amoxicillin 500 MG tablet Commonly known as: AMOXIL Take 4 tablets (2,000 mg total) by mouth as directed. 1 HOUR PRIOR TO DENTAL APPOINTMENTS What changed: Another medication with the same name was added. Make sure you understand how and when to take each.   amoxicillin 500 MG capsule Commonly known as: AMOXIL Take 1 capsule (500 mg total) by mouth 3 (three) times daily for 7  days. What changed: You were already taking a medication with the same name, and this prescription was added. Make sure you understand how and when to take each.   apixaban 2.5 MG Tabs tablet Commonly known as: ELIQUIS Take 1 tablet (2.5 mg total) by mouth 2 (two) times daily. What changed:  medication strength how much to take   cyanocobalamin 500 MCG tablet Commonly known as: VITAMIN B12 Take 500 mcg by mouth daily.   furosemide 40 MG tablet Commonly known as: LASIX Take 1 tablet (40 mg total) by mouth daily.   gabapentin 600 MG tablet Commonly known as: NEURONTIN Take 1 tablet (600 mg total) by mouth at bedtime.   hydrALAZINE 25 MG tablet Commonly known as: APRESOLINE TAKE 1 TABLET(25 MG) BY MOUTH IN THE MORNING AND AT BEDTIME What changed: See the new instructions.   iron polysaccharides 150 MG capsule Commonly known as: NIFEREX Take 1 capsule (150 mg total) by mouth daily.   metoprolol tartrate 25 MG tablet Commonly known as: LOPRESSOR Take 1 tablet (25 mg total) by mouth 2 (two) times daily.   oxybutynin 5 MG tablet Commonly known as: DITROPAN Take 2 tablets (10 mg total) by mouth every 8 (eight) hours as needed for bladder spasms.   potassium chloride 10 MEQ tablet Commonly known as: KLOR-CON M Take 1 tablet by mouth daily (with Furosemide)   pravastatin 40 MG tablet Commonly known as: PRAVACHOL Take 1 tablet (40 mg total) by mouth daily.   QUEtiapine 25 MG tablet Commonly known as: SEROQUEL Take 1 tablet (25 mg total) by mouth at bedtime as needed (agitation).   Vitamin D3 50 MCG (2000 UT) Caps Generic drug: Cholecalciferol Take 2,000 Units by mouth daily.               Durable Medical Equipment  (From admission, onward)           Start     Ordered   01/02/23 1314  For home use only DME Walker rolling  Once       Question Answer Comment  Walker: With 5 Inch Wheels   Patient needs a walker to treat with the following condition Gait  abnormality      01/02/23 1313           Allergies  Allergen Reactions   Lisinopril Other (See Comments)    Hyperkalemia in the context of combined spironolactone and lisinopril therapy   Spironolactone Other (See Comments)    Hyperkalemia in context of spironolactone and lisinopril therapy    Follow-up Information     [facility]. Schedule an appointment as soon as possible for a visit in 1 week(s).   Why: Follow-up in 1 to 2 weeks.  Contact information: [place]        Binnie Rail, MD. Schedule an appointment as soon as possible for a visit in 1 week(s).   Specialty: Internal Medicine Why: Will need BMET on follow-up. Contact information: [place] [address] [phone]         Home, Medi Follow up.   Why: Home Health agency will follow up 24hs to 48hrs after discharge. Contact information: [place] [address] [phone]                  The results of significant diagnostics from this hospitalization (including imaging, microbiology, ancillary and laboratory) are listed below for reference.    Significant Diagnostic Studies: DG CHEST PORT 1 VIEW  Result Date: 01/01/2023 CLINICAL DATA:  Shortness of breath and hypoxia EXAM: PORTABLE CHEST 1 VIEW COMPARISON:  11/08/2021 FINDINGS: Cardiac shadow is enlarged in size but stable. Changes of prior TAVR are noted. Aortic calcifications are seen. The lungs are well aerated bilaterally. Small left pleural effusion is noted. Mild central vascular congestion is seen. No bony abnormality is noted. IMPRESSION: Mild vascular congestion with small left effusion. No other focal abnormality is noted. Electronically Signed   By: Inez Catalina M.D.   On: 01/01/2023 19:37   CT Renal Stone Study  Result Date: 12/27/2022 CLINICAL DATA:  Hematuria and dysuria.  Urinary retention. EXAM: CT ABDOMEN AND PELVIS  WITHOUT CONTRAST TECHNIQUE: Multidetector CT imaging of the abdomen and pelvis was performed following the standard protocol without IV contrast. RADIATION DOSE REDUCTION: This exam was performed according to the departmental dose-optimization program which includes automated exposure control, adjustment of the mA and/or kV according to patient size and/or use of iterative reconstruction technique. COMPARISON:  03/02/2021 FINDINGS: Lower chest: Small bilateral pleural effusions without significant change. Stable cardiomegaly. Hepatobiliary: No mass visualized on this unenhanced exam. Fluid attenuation cyst again noted in the left hepatic lobe. Tiny calcified gallstones are seen, however there is no evidence of cholecystitis or biliary ductal dilatation. Pancreas: No mass or inflammatory process visualized on this unenhanced exam. Spleen:  Within normal limits in size. Adrenals/Urinary tract: 2 mm calculus noted in lower pole of left kidney. No evidence of ureteral calculi or hydronephrosis. Lobulated density is seen within the bladder lumen along the posterior wall which measures 5.1 x 3.0 cm. This has attenuation value of 42 Hounsfield units. This could represent blood clot or bladder neoplasm. Stomach/Bowel: No evidence of obstruction, inflammatory process, or abnormal fluid collections. Vascular/Lymphatic: No pathologically enlarged lymph nodes identified. No evidence of abdominal aortic aneurysm. Aortic atherosclerotic calcification incidentally noted. Reproductive:  Brachytherapy seeds seen throughout prostate gland. Other:  Large left scrotal hydrocele is again seen. Musculoskeletal:  No suspicious bone lesions identified. IMPRESSION: 5 cm lobulated density within the bladder, which could represent blood clot or bladder neoplasm. Consider cystoscopy for further evaluation. Tiny nonobstructing left renal calculus. No evidence of ureteral calculi or hydronephrosis. Cholelithiasis. No radiographic evidence of  cholecystitis. Large left scrotal hydrocele. Stable small bilateral pleural effusions. Aortic Atherosclerosis (ICD10-I70.0). Electronically Signed   By: Marlaine Hind M.D.   On: 12/27/2022 15:31    Microbiology: Recent Results (from the past 240 hour(s))  Urine Culture     Status: Abnormal   Collection Time: 12/27/22  4:27 PM   Specimen: Urine, Clean Catch  Result Value Ref Range Status   Specimen Description   Final    URINE,  CLEAN CATCH Performed at [facility], [address], [place], [place] [address]    Special Requests   Final    NONE Performed at [facility], [address] [place],  [address]    Culture 60,000 COLONIES/mL ENTEROCOCCUS FAECALIS (A)  Final   Report Status 12/30/2022 FINAL  Final   Organism ID, Bacteria ENTEROCOCCUS FAECALIS (A)  Final      Susceptibility   Enterococcus faecalis - MIC*    AMPICILLIN <=2 SENSITIVE Sensitive     NITROFURANTOIN <=16 SENSITIVE Sensitive     VANCOMYCIN 1 SENSITIVE Sensitive     * 60,000 COLONIES/mL ENTEROCOCCUS FAECALIS     Labs: Basic Metabolic Panel: Recent Labs  Lab 12/31/22 1045 01/01/23 0440 01/02/23 0400 01/03/23 0425 01/04/23 0411  NA 141 140 141 139 138  K 4.1 4.0 3.8 3.4* 3.7  CL 110 109 107 102 102  CO2 23 23 26 28 28   GLUCOSE 83 82 93 81 82  BUN 30* 25* 23 21 21   CREATININE 1.15 1.07 1.07 1.06 1.01  CALCIUM 8.6* 8.5* 8.8* 8.6* 8.5*  MG 2.3  --  2.2 2.2  --    Liver Function Tests: Recent Labs  Lab 12/29/22 0354  AST 24  ALT 17  ALKPHOS 41  BILITOT 1.5*  PROT 6.7  ALBUMIN 4.0   No results for input(s): "LIPASE", "AMYLASE" in the last 168 hours. No results for input(s): "AMMONIA" in the last 168 hours. CBC: Recent Labs  Lab 12/29/22 0354 12/29/22 1036 12/31/22 1045 12/31/22 1624 01/01/23 0440 01/02/23 0400 01/03/23 0425  WBC 11.2*  --   --   --  7.6 7.0 6.1  NEUTROABS  --   --   --   --   --  5.4  --   HGB 8.2*   < > 8.8* 9.2* 8.7* 9.0*  9.5*  HCT 27.0*   < > 28.2* 30.8* 27.8* 29.9* 30.7*  MCV 94.7  --   --   --  93.0 93.7 91.6  PLT 154  --   --   --  134* 150 170   < > = values in this interval not displayed.   Cardiac Enzymes: No results for input(s): "CKTOTAL", "CKMB", "CKMBINDEX", "TROPONINI" in the last 168 hours. BNP: BNP (last 3 results) Recent Labs    01/01/23 1956 01/02/23 0400  BNP 2,505.8* 2,982.8*    ProBNP (last 3 results) No results for input(s): "PROBNP" in the last 8760 hours.  CBG: No results for input(s): "GLUCAP" in the last 168 hours.     Signed:  Irine Seal MD.  [facility] 01/04/2023, 2:32 PM

## 2023-01-04 NOTE — TOC Initial Note (Signed)
Transition of Care [facility]) - Initial/Assessment Note    Patient Details  Name: Paul Mays MRN: [id number] Date of Birth: Apr 03, 1928  Transition of Care [facility]) CM/SW Contact:    Rodney Booze, LCSW Phone Number: 01/04/2023, 9:51 AM  Clinical Narrative:                 CSW spoke with the son who is the POA. The son is in agreement with the patient going to a SNF. FL2 sent out TOC will present bed offers on 01/05/2023. TOC will continue to follow.  Expected Discharge Plan: Skilled Nursing Facility Barriers to Discharge: SNF Pending bed offer   Patient Goals and CMS Choice Patient states their goals for this hospitalization and ongoing recovery are:: return home CMS Medicare.gov Compare Post Acute Care list provided to:: Patient Represenative (must comment) Choice offered to / list presented to : Adult [place] ownership interest in [facility].provided to:: Adult Children    Expected Discharge Plan and Services       Living arrangements for the past 2 months: Single Family Home Expected Discharge Date: 01/01/23                           [facility] Agency:  (medi) Date HH Agency Contacted: 01/01/23 Time HH Agency Contacted: 66 Representative spoke with at [place] Arrangements/Services Living arrangements for the past 2 months: [place] with:: Self, Adult Children                   Activities of Daily Living Home Assistive Devices/Equipment: None ADL Screening (condition at time of admission) Patient's cognitive ability adequate to safely complete daily activities?: Yes Is the patient deaf or have difficulty hearing?: Yes Does the patient have difficulty seeing, even when wearing glasses/contacts?: No Does the patient have difficulty concentrating, remembering, or making decisions?: No Patient able to express need for assistance with ADLs?: Yes Does the patient have difficulty dressing or bathing?:  No Independently performs ADLs?: Yes (appropriate for developmental age) Does the patient have difficulty walking or climbing stairs?: No Weakness of Legs: Both Weakness of Arms/Hands: None  Permission Sought/Granted                  Emotional Assessment       Orientation: : Oriented to Self, Oriented to Place, Oriented to  Time Alcohol / Substance Use: Not Applicable Psych Involvement: No (comment)  Admission diagnosis:  Dysuria [R30.0] Gross hematuria [R31.0] Anemia, unspecified type [D64.9] Patient Active Problem List   Diagnosis Date Noted   Hypoxia 01/01/2023   Acute cystitis with hematuria 12/31/2022   Paroxysmal atrial fibrillation ([place]) 12/31/2022   Chronic diastolic CHF (congestive heart failure) ([place]) 12/31/2022   Gross hematuria 12/27/2022   Multiple skin tears 11/29/2022   Chronic kidney disease, stage 3 ([place]) 11/10/2022   Arthritis 11/10/2022   Headache 11/10/2022   Herpes zoster 11/10/2022   Peripheral edema 11/10/2022   Ulcerative colitis ([place]) 11/10/2022   CAD (coronary artery disease) 11/10/2022   Neuropathy 11/10/2022   Obstructive sleep apnea 11/10/2022   Prostate cancer ([place]) 11/10/2022   Acquired trigger finger of right little finger 08/19/2022   Acquired trigger finger of right middle finger 08/19/2022   Anemia 07/22/2022   Chronic neck pain 06/10/2022   Trigger finger, right middle finger 04/01/2022   Trigger little finger of right hand 03/27/2022   S/P TAVR (transcatheter aortic valve  replacement) 11/12/2021   Dyspnea on exertion 08/05/2021   Acute on chronic diastolic heart failure ([place]) 08/05/2021   Lower extremity ulceration ([place]) 03/08/2021   Weight loss 02/19/2021   Lightheaded [id number]   Periumbilical pain 123XX123   Fall 08/21/2020   Head trauma, initial encounter 08/21/2020   Bilateral leg edema 01/26/2020   GERD (gastroesophageal reflux disease) 01/26/2020   Chronic pain of both shoulders 12/10/2018   Easy bruising  03/20/2018   Decreased hearing of right ear 09/04/2017   Rotator cuff tear arthropathy of both shoulders 10/18/2015   Thrombocytopenia ([place]) 09/24/2015   Diverticulosis of colon without hemorrhage 07/02/2014   Hypertrophic cardiomyopathy ([place]) 03/24/2011   OBSTRUCTIVE SLEEP APNEA 11/08/2010   HYPERTENSION, PULMONARY 09/16/2010   HEARING LOSS, BILATERAL 06/18/2009   Coronary atherosclerosis 03/07/2009   Severe aortic stenosis 03/07/2009   CAROTID STENOSIS 03/07/2009   Hyperlipidemia 11/15/2008   Peripheral neuropathy, idiopathic 11/15/2008   Essential hypertension 11/15/2008   COLITIS, HX OF 11/15/2008   VENOUS INSUFFICIENCY 04/20/2007   Gout 02/10/2007   PCP:  Binnie Rail, MD Pharmacy:   [facility] [place], [place] - [address] AT [address] [place] [address] Phone: [phone] Fax: [place], [place] - [address] AT [facility] [place] [address] [place] [address] Phone: [phone] Fax: [place], [place] AT [place] [address] Phone: [phone] Fax: [phone]     Social Determinants of Health (SDOH) Social History: SDOH Screenings   Food Insecurity: No Food Insecurity (12/28/2022)  Housing: Low Risk  (12/28/2022)  Transportation Needs: No Transportation Needs (12/28/2022)  Utilities: Not At Risk (12/28/2022)  Alcohol Screen: Low Risk  (01/29/2022)  Depression (PHQ2-9): Low Risk  (11/28/2022)  Financial Resource Strain: Low Risk  (01/29/2022)  Physical Activity: Sufficiently Active (01/29/2022)  Social Connections: Socially Integrated (01/29/2022)  Stress: No Stress Concern Present (01/29/2022)  Tobacco Use: Low Risk  (12/27/2022)   SDOH Interventions:     Readmission Risk Interventions     01/04/2023    9:49 AM 11/13/2021   11:43 AM  Readmission Risk Prevention Plan  Post Dischage Appt  Complete  Medication Screening  Complete  Transportation Screening Complete Complete  PCP or Specialist Appt within 3-5 Days Complete   HRI or [place] Complete   Social Work Consult for [place] Planning/Counseling Complete   Palliative Care Screening Complete   Medication Review Press photographer) Not Complete   Med Review Comments RNCM will review

## 2023-01-04 NOTE — Progress Notes (Signed)
SATURATION QUALIFICATIONS: (This note is used to comply with regulatory documentation for home oxygen)  Patient Saturations on Room Air at Rest = 92%  Patient Saturations on Room Air while Ambulating = 89%   

## 2023-01-04 NOTE — TOC Transition Note (Signed)
Transition of Care [facility]) - CM/SW Discharge Note   Patient Details  Name: Paul Mays MRN: [id number] Date of Birth: [birth year]  Transition of Care [facility]) CM/SW Contact:  Rodney Booze, LCSW Phone Number: 01/04/2023, 12:55 PM   Clinical Narrative:    CSW has made sure that home health was set up. Patient will have RN, SW, PT, OT, [place]. Family is aware of this. The patient is DC today home. The son will pick the patient up. TOC will help with DC at this time there are no other needs.   Final next level of care: Home w Home Health Services Barriers to Discharge: No Barriers Identified   Patient Goals and CMS Choice CMS Medicare.gov Compare Post Acute Care list provided to:: Patient Represenative (must comment) Choice offered to / list presented to : Adult Children  Discharge Placement                  Patient to be transferred to facility by: Family Name of family member notified: Son [name] Patient and family notified of of transfer: 01/04/23  Discharge Plan and Services Additional resources added to the After Visit Summary for                              [facility] Agency:  ([place]) Date [place]: 01/04/23 Time [place]: 1253 Representative spoke with at [place]: [place] ([place]) Interventions SDOH Screenings   Food Insecurity: No Food Insecurity (12/28/2022)  Housing: Low Risk  (12/28/2022)  Transportation Needs: No Transportation Needs (12/28/2022)  Utilities: Not At Risk (12/28/2022)  Alcohol Screen: Low Risk  (01/29/2022)  Depression (PHQ2-9): Low Risk  (11/28/2022)  Financial Resource Strain: Low Risk  (01/29/2022)  Physical Activity: Sufficiently Active (01/29/2022)  Social Connections: Socially Integrated (01/29/2022)  Stress: No Stress Concern Present (01/29/2022)  Tobacco Use: Low Risk  (12/27/2022)     Readmission Risk Interventions    01/04/2023    9:49 AM 11/13/2021   11:43 AM  Readmission Risk  Prevention Plan  Post Dischage Appt  Complete  Medication Screening  Complete  Transportation Screening Complete Complete  PCP or Specialist Appt within 3-5 Days Complete   HRI or [place] Complete   Social Work Consult for [place] Planning/Counseling Complete   Palliative Care Screening Complete   Medication Review Press photographer) Not Complete   Med Review Comments RNCM will review

## 2023-01-05 ENCOUNTER — Encounter: Payer: Self-pay | Admitting: *Deleted

## 2023-01-05 ENCOUNTER — Telehealth: Payer: Self-pay | Admitting: Cardiovascular Disease

## 2023-01-05 ENCOUNTER — Telehealth: Payer: Self-pay | Admitting: *Deleted

## 2023-01-05 DIAGNOSIS — C61 Malignant neoplasm of prostate: Secondary | ICD-10-CM | POA: Diagnosis not present

## 2023-01-05 DIAGNOSIS — I130 Hypertensive heart and chronic kidney disease with heart failure and stage 1 through stage 4 chronic kidney disease, or unspecified chronic kidney disease: Secondary | ICD-10-CM | POA: Diagnosis not present

## 2023-01-05 DIAGNOSIS — G44009 Cluster headache syndrome, unspecified, not intractable: Secondary | ICD-10-CM | POA: Diagnosis not present

## 2023-01-05 DIAGNOSIS — I5032 Chronic diastolic (congestive) heart failure: Secondary | ICD-10-CM | POA: Diagnosis not present

## 2023-01-05 DIAGNOSIS — B952 Enterococcus as the cause of diseases classified elsewhere: Secondary | ICD-10-CM | POA: Diagnosis not present

## 2023-01-05 DIAGNOSIS — G4733 Obstructive sleep apnea (adult) (pediatric): Secondary | ICD-10-CM | POA: Diagnosis not present

## 2023-01-05 DIAGNOSIS — I480 Paroxysmal atrial fibrillation: Secondary | ICD-10-CM | POA: Diagnosis not present

## 2023-01-05 DIAGNOSIS — I6529 Occlusion and stenosis of unspecified carotid artery: Secondary | ICD-10-CM | POA: Diagnosis not present

## 2023-01-05 DIAGNOSIS — N3001 Acute cystitis with hematuria: Secondary | ICD-10-CM | POA: Diagnosis not present

## 2023-01-05 DIAGNOSIS — N179 Acute kidney failure, unspecified: Secondary | ICD-10-CM | POA: Diagnosis not present

## 2023-01-05 DIAGNOSIS — F05 Delirium due to known physiological condition: Secondary | ICD-10-CM | POA: Diagnosis not present

## 2023-01-05 DIAGNOSIS — M1990 Unspecified osteoarthritis, unspecified site: Secondary | ICD-10-CM | POA: Diagnosis not present

## 2023-01-05 DIAGNOSIS — M109 Gout, unspecified: Secondary | ICD-10-CM | POA: Diagnosis not present

## 2023-01-05 DIAGNOSIS — F419 Anxiety disorder, unspecified: Secondary | ICD-10-CM | POA: Diagnosis not present

## 2023-01-05 DIAGNOSIS — H9190 Unspecified hearing loss, unspecified ear: Secondary | ICD-10-CM | POA: Diagnosis not present

## 2023-01-05 DIAGNOSIS — I350 Nonrheumatic aortic (valve) stenosis: Secondary | ICD-10-CM | POA: Diagnosis not present

## 2023-01-05 DIAGNOSIS — N183 Chronic kidney disease, stage 3 unspecified: Secondary | ICD-10-CM | POA: Diagnosis not present

## 2023-01-05 DIAGNOSIS — E785 Hyperlipidemia, unspecified: Secondary | ICD-10-CM | POA: Diagnosis not present

## 2023-01-05 DIAGNOSIS — M7020 Olecranon bursitis, unspecified elbow: Secondary | ICD-10-CM | POA: Diagnosis not present

## 2023-01-05 DIAGNOSIS — I272 Pulmonary hypertension, unspecified: Secondary | ICD-10-CM | POA: Diagnosis not present

## 2023-01-05 DIAGNOSIS — D631 Anemia in chronic kidney disease: Secondary | ICD-10-CM | POA: Diagnosis not present

## 2023-01-05 DIAGNOSIS — G609 Hereditary and idiopathic neuropathy, unspecified: Secondary | ICD-10-CM | POA: Diagnosis not present

## 2023-01-05 DIAGNOSIS — G43909 Migraine, unspecified, not intractable, without status migrainosus: Secondary | ICD-10-CM | POA: Diagnosis not present

## 2023-01-05 DIAGNOSIS — H6120 Impacted cerumen, unspecified ear: Secondary | ICD-10-CM | POA: Diagnosis not present

## 2023-01-05 DIAGNOSIS — I2510 Atherosclerotic heart disease of native coronary artery without angina pectoris: Secondary | ICD-10-CM | POA: Diagnosis not present

## 2023-01-05 NOTE — Transitions of Care (Post Inpatient/ED Visit) (Signed)
01/05/2023  Name: Paul Mays MRN: [id number] DOB: 1928-05-16  Today's TOC FU Call Status: Today's TOC FU Call Status:: Successful TOC FU Call Competed TOC FU Call Complete Date: 01/05/23  Transition Care Management Follow-up Telephone Call Date of Discharge: 01/04/23 Discharge Facility: [facility]) Type of Discharge: Inpatient Admission Primary Inpatient Discharge Diagnosis:: gross hematuria How have you been since you were released from the hospital?: Better (per son [place], on [place]: "Things are going very well so far; he has the foley and we are managing that fine.  We have a hired caregiver that comes in and cares for he and my Mom; they are not ever alone, and especially not now that Dad has been sick") Any questions or concerns?: No  Items Reviewed: Did you receive and understand the discharge instructions provided?: Yes (thoroughly reviewed with patient who verbalizes excellent understanding of same) Medications obtained and verified?: Yes (Medications Reviewed) (Full medication review completed; no concerns or discrepancies identified; confirmed patient obtained/ is taking all newly Rx'd medications as instructed; caregiver-manages medications and denies questions/ concerns around medications today) Any new allergies since your discharge?: No Dietary orders reviewed?: Yes Type of Diet Ordered:: "Pretty much regular; he tries to watch his salt" Do you have support at home?: Yes People in Home: spouse, child(ren), adult Name of Support/Comfort Primary Source: patient independent in self-care at baseline; son and other local family members assists as needed/ indicated; patient also has private duty caregiver at various hours as needed-- currently at patient's home "every day"  Home Care and Equipment/Supplies: [place] Ordered?: Yes Name of [place]:: [place] set up a time to come to your home?: Yes [place] Visit Date:  01/05/23 Any new equipment or medical supplies ordered?: Yes Name of Medical supply agency?: foley catheter Were you able to get the equipment/medical supplies?: Yes (came home from hospital with foley catheter- son/ caregiver denies problems thus far- education provided aorund care of foley- son verbalizes very good baseline understanding of same) Do you have any questions related to the use of the equipment/supplies?: No  Functional Questionnaire: Do you need assistance with bathing/showering or dressing?: No Do you need assistance with meal preparation?: No Do you need assistance with eating?: No Do you have difficulty maintaining continence: No Do you need assistance with getting out of bed/getting out of a chair/moving?: No Do you have difficulty managing or taking your medications?: No (at baseline, self-managed medications; post-recent hospitalization: caregiver managing medications)  Follow up appointments reviewed: PCP Follow-up appointment confirmed?: Yes Date of PCP follow-up appointment?: 01/09/23 Follow-up Provider: PCP- Dr. Quay Burow Specialist [facility] Follow-up appointment confirmed?: Yes Date of Specialist follow-up appointment?: 01/15/23 Follow-Up Specialty Provider:: [facility] Do you need transportation to your follow-up appointment?: No Do you understand care options if your condition(s) worsen?: Yes-patient verbalized understanding  SDOH Interventions Today    Flowsheet Row Most Recent Value  SDOH Interventions   Food Insecurity Interventions Intervention Not Indicated  Transportation Interventions Intervention Not Indicated  [at baseline- drives self,  private duty caregiver and family members assisting as indicated/ needed post-recent hospital discharge on 01/04/23]      TOC Interventions Today    Flowsheet Row Most Recent Value  TOC Interventions   TOC Interventions Discussed/Reviewed TOC Interventions Discussed, S/S of infection  [declines need for  ongoing/ further care coordination outreach,  no care coordination needs identified at time of TOC call today,  provided my direct contact information should  questions/ concerns/ needs arise post-TOC call]      Interventions Today    Flowsheet Row Most Recent Value  Chronic Disease   Chronic disease during today's visit Other  [gross hematuria]  General Interventions   General Interventions Discussed/Reviewed General Interventions Discussed, Doctor Visits, Durable Medical Equipment (DME)  Doctor Visits Discussed/Reviewed Doctor Visits Discussed  Durable Medical Equipment (DME) Walker  Education Interventions   Education Provided Provided Education  Provided Verbal Education On Other, [employer], Medication, When to see the doctor  [ongoing foley catheter care post-hospital discharge]  Nutrition Interventions   Nutrition Discussed/Reviewed Nutrition Discussed  Pharmacy Interventions   Pharmacy Dicussed/Reviewed Pharmacy Topics Discussed  [Full medication review with updating medication list in EHR per patient report]  Safety Interventions   Safety Discussed/Reviewed Safety Discussed  [confired patient using walker as instructed]      Oneta Rack, RN, BSN, CCRN Alumnus RN CM Care Coordination/ Transition of [place] Management [phone]: direct office

## 2023-01-05 NOTE — Telephone Encounter (Signed)
Son was calling in to see if the labs still needed because patient was in the hosptial and labs was drawn then. Please advise .

## 2023-01-05 NOTE — Telephone Encounter (Signed)
Per OV note by Burt Knack on 10/28/22:  The patient appears volume overloaded on exam.  His IVC is dilated on echo and he has pulmonary hypertension.  Recommend that he start taking furosemide 40 mg daily.  His son emphasized the importance of diuretic use with him today.  He will start taking potassium chloride 10 mill equivalents on days that he takes Lasix.  He has NYHA class II symptoms. Will bring him back for follow-up labs and an APP visit in a few months.  Considering his advanced age, I suspect his diastolic heart failure is related to the presence of severe aortic stenosis, senile sigmoid septum, possible amyloid heart disease which we have not aggressively investigated at age 32, and hypertension.  Will continue to treat him medically. As above, emphasized the importance of daily furosemide.  The patient understands and is motivated to start taking his diuretic.  He understands that frequent urination comes with the territory.  Patient had BMET done in hospital yesterday which showed creatinine of 1.01. Returned call to son who verified that patient has been taking lasix and potassium daily. Explained there's no need to repeat lab at this time. Pt's son states they don't want to keep an appt with Nicki Reaper since they were just seen in hospital. Advised that he call back to schedule appt when available, but that [facility] had wanted to see him sooner than one year d/t his age an HF. Son states he understands, but will call back when ready for f/u appt.

## 2023-01-06 ENCOUNTER — Ambulatory Visit: Payer: Medicare Other

## 2023-01-06 ENCOUNTER — Ambulatory Visit: Payer: Medicare Other | Admitting: Physician Assistant

## 2023-01-06 ENCOUNTER — Ambulatory Visit: Payer: Self-pay

## 2023-01-06 NOTE — Chronic Care Management (AMB) (Signed)
   01/06/2023  Paul Mays October 01, 1928 [id number]   Reason for Encounter: Patient is not currently enrolled in the CCM program. CCM status changed to previously enrolled.   Horris Latino RN Care Manager/Chronic Care Management [phone]

## 2023-01-07 DIAGNOSIS — I480 Paroxysmal atrial fibrillation: Secondary | ICD-10-CM | POA: Diagnosis not present

## 2023-01-07 DIAGNOSIS — B952 Enterococcus as the cause of diseases classified elsewhere: Secondary | ICD-10-CM | POA: Diagnosis not present

## 2023-01-07 DIAGNOSIS — I130 Hypertensive heart and chronic kidney disease with heart failure and stage 1 through stage 4 chronic kidney disease, or unspecified chronic kidney disease: Secondary | ICD-10-CM | POA: Diagnosis not present

## 2023-01-07 DIAGNOSIS — N3001 Acute cystitis with hematuria: Secondary | ICD-10-CM | POA: Diagnosis not present

## 2023-01-07 DIAGNOSIS — I5032 Chronic diastolic (congestive) heart failure: Secondary | ICD-10-CM | POA: Diagnosis not present

## 2023-01-07 DIAGNOSIS — N183 Chronic kidney disease, stage 3 unspecified: Secondary | ICD-10-CM | POA: Diagnosis not present

## 2023-01-08 ENCOUNTER — Encounter: Payer: Self-pay | Admitting: Internal Medicine

## 2023-01-08 DIAGNOSIS — R451 Restlessness and agitation: Secondary | ICD-10-CM | POA: Insufficient documentation

## 2023-01-08 NOTE — Progress Notes (Unsigned)
Subjective:    Patient ID: Paul Mays, male    DOB: 07/25/1928, 89 y.o.   MRN: [id number]     HPI Paul Mays is here for follow up from the hospital.  He is here with his son.  Admitted 3/23 - 3/31  Presented with blood in the urine.  He was started on eliquis 2 months prior for new afib.  He also had stinging with urination x 1 week.  H/o prostate ca - in remission x 10 years.  He had gross hematuria in ED.  Ct abd/pelvis showed 5 cm lobulated density in bladder which could represent a clot of neoplasm.    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 Newly dx Started on elquis  10/2022- stopped due to bleeding Eliquis restarted on 2.5 mg bid Cardio outpatient f/u  CAD s/p TAVR, htn, gout, hld, peripheral neuropathy: Stable  Acute on chronic diastolic HF/volume overload: Pt hypoxic on day of d/c BNP 2982.8 Likely related to diuretic being held on admission and having IVF Received IV lasix daily x 4 days Hypoxia improved  AKI: S/p foley, s/p IVF, diuretic held GFR improved Home lasix resumed on d/c Needs bmp  Acute on chronic anemia of chronic dz: Hgb noted as low as 7.5 from 8.9 on day of admission - likely secondary to gross hematuria S/p transfusion of 1 unit PRBC Hgb at 9/5 on d/c   Delirium, sundowning: Maintained on seroquel at HS during  hospitalization - discharged on seroquel 25 mg HS as needed agitation  Discharged home with rolling walker    Had PT yesterday - it wore him out.  Has shoulder pain.  His back hurts, legs hurt and are very weak.  Has been bedridden unless they can stand him up. He is much weaker since leaving the hospital.  He did have a CNA come and helped him bathe.  His son is concerned because he is so weak.   He is not currently taking Seroquel.  He is taking all of his other medications as prescribed.  He had a little bit of pink in his urine at 1 point, but none since then.  He is eating.  Sleep is just okay.    Medications and allergies reviewed with patient and updated if appropriate.  Current Outpatient Medications on File Prior to Visit  Medication Sig Dispense Refill   acetaminophen (TYLENOL) 325 MG tablet Take 2 tablets (650 mg total) by mouth every 6 (six) hours as needed for mild pain (or Fever >/= 101).     allopurinol (ZYLOPRIM) 300 MG tablet TAKE 1 TABLET(300 MG) BY MOUTH DAILY AS NEEDED FOR GOUT FLARE (Patient taking differently: Take 300 mg by mouth daily as needed (gout flare).) 90 tablet 0   amoxicillin (AMOXIL) 500 MG tablet Take 4 tablets (2,000 mg total) by mouth as directed. 1  HOUR PRIOR TO DENTAL APPOINTMENTS 12 tablet 6   apixaban (ELIQUIS) 2.5 MG TABS tablet Take 1 tablet (2.5 mg total) by mouth 2 (two) times daily. 60 tablet 2   Cholecalciferol (VITAMIN D3) 50 MCG (2000 UT) capsule Take 2,000 Units by mouth daily.     furosemide (LASIX) 40 MG tablet Take 1 tablet (40 mg total) by mouth daily.     gabapentin (NEURONTIN) 600 MG tablet Take 1 tablet (600 mg total) by mouth at bedtime. 90 tablet 1   hydrALAZINE (APRESOLINE) 25 MG tablet TAKE 1 TABLET(25 MG) BY MOUTH IN THE MORNING AND AT BEDTIME (Patient taking differently: Take 25 mg by mouth 2 (two) times daily.) 60 tablet 10   iron polysaccharides (NIFEREX) 150 MG capsule Take 1 capsule (150 mg total) by mouth daily. 30 capsule  5   metoprolol tartrate (LOPRESSOR) 25 MG tablet Take 1 tablet (25 mg total) by mouth 2 (two) times daily. 180 tablet 3   oxybutynin (DITROPAN) 5 MG tablet Take 2 tablets (10 mg total) by mouth every 8 (eight) hours as needed for bladder spasms. 20 tablet 0   potassium chloride (KLOR-CON M) 10 MEQ tablet Take 1 tablet by mouth daily (with Furosemide) 90 tablet 3   pravastatin (PRAVACHOL) 40 MG tablet Take 1 tablet (40 mg total) by mouth daily. 90 tablet 3   QUEtiapine (SEROQUEL) 25 MG tablet Take 1 tablet (25 mg total) by mouth at bedtime as needed (agitation). 20 tablet 0   vitamin B-12 (CYANOCOBALAMIN) 500 MCG tablet Take 500 mcg by mouth daily.     No current facility-administered medications on file prior to visit.     Review of Systems  Constitutional:  Positive for fever. Negative for appetite change.  Respiratory:  Negative for cough, shortness of breath and wheezing.   Cardiovascular:  Positive for palpitations. Negative for chest pain and leg swelling.  Gastrointestinal:  Negative for abdominal pain, constipation and diarrhea.  Genitourinary:  Positive for hematuria (once sinece being home).  Neurological:  Positive for light-headedness. Negative for headaches.  Psychiatric/Behavioral:  Negative for confusion.        Objective:   Vitals:   01/09/23 1054  BP: 130/82  Pulse: 76  Temp: 98.2 F (36.8 C)  SpO2: 97%   BP Readings from Last 3 Encounters:  01/09/23 130/82  01/04/23 99/61  12/01/22 116/80   Wt Readings from Last 3 Encounters:  01/09/23 132 lb (59.9 kg)  01/04/23 132 lb 11.5 oz (60.2 kg)  12/01/22 141 lb (64 kg)   Body mass index is 22.66 kg/m.    Physical Exam Constitutional:      General: He is not in acute distress.    Appearance: He is not ill-appearing.     Comments: Chronically ill-appearing-this is a significant difference from when I saw him last  HENT:     Head: Normocephalic and atraumatic.  Cardiovascular:     Rate and Rhythm: Normal  rate and regular rhythm.  Pulmonary:     Effort: Pulmonary effort is normal. No respiratory distress.     Breath sounds: No wheezing or rales.  Abdominal:     General: There is no distension.     Palpations: Abdomen is soft.     Tenderness: There is no abdominal tenderness.  Musculoskeletal:     Cervical back: Neck supple. No tenderness.     Right lower leg: No edema.     Left lower leg: No edema.  Lymphadenopathy:     Cervical: No  cervical adenopathy.  Skin:    General: Skin is warm and dry.     Comments: Skin tear that is small that is not currently bleeding, no surrounding erythema-left elbow.  The skin tear right anterior lower leg-not currently bleeding, no erythema or discharge  Neurological:     Mental Status: He is alert.        Lab Results  Component Value Date   WBC 6.1 01/03/2023   HGB 9.5 (L) 01/03/2023   HCT 30.7 (L) 01/03/2023   PLT 170 01/03/2023   GLUCOSE 82 01/04/2023   CHOL 149 12/09/2017   TRIG 57.0 12/09/2017   HDL 72.30 12/09/2017   LDLCALC 65 12/09/2017   ALT 17 12/29/2022   AST 24 12/29/2022   NA 138 01/04/2023   K 3.7 01/04/2023   CL 102 01/04/2023   CREATININE 1.01 01/04/2023   BUN 21 01/04/2023   CO2 28 01/04/2023   TSH 2.34 02/19/2021   PSA 0.00 (L) 02/19/2021   INR 1.2 11/08/2021   HGBA1C 4.9 11/08/2021     Assessment & Plan:    See Problem List for Assessment and Plan of chronic medical problems.

## 2023-01-08 NOTE — Assessment & Plan Note (Addendum)
Acute on chronic anemia of chronic dz S/p 1 unit PRBCs Had pink-colored urine 1 day since being home from the hospital, but no other symptoms of bleeding CBC today Sees urology next week

## 2023-01-08 NOTE — Assessment & Plan Note (Addendum)
New Had agitation / sundowning in hospital at night Started on seroquel 25 mg HS while in the hospital and discharged with it for prn HS He has not take Seroquel at home and advised for him to not take it unless it is needed

## 2023-01-09 ENCOUNTER — Ambulatory Visit: Payer: Self-pay | Admitting: *Deleted

## 2023-01-09 ENCOUNTER — Telehealth: Payer: Self-pay | Admitting: *Deleted

## 2023-01-09 ENCOUNTER — Encounter: Payer: Self-pay | Admitting: Internal Medicine

## 2023-01-09 ENCOUNTER — Ambulatory Visit (INDEPENDENT_AMBULATORY_CARE_PROVIDER_SITE_OTHER): Payer: Medicare Other | Admitting: Internal Medicine

## 2023-01-09 VITALS — BP 130/82 | HR 76 | Temp 98.2°F | Ht 64.0 in | Wt 132.0 lb

## 2023-01-09 DIAGNOSIS — Z87440 Personal history of urinary (tract) infections: Secondary | ICD-10-CM

## 2023-01-09 DIAGNOSIS — N3001 Acute cystitis with hematuria: Secondary | ICD-10-CM

## 2023-01-09 DIAGNOSIS — R5381 Other malaise: Secondary | ICD-10-CM | POA: Diagnosis not present

## 2023-01-09 DIAGNOSIS — I480 Paroxysmal atrial fibrillation: Secondary | ICD-10-CM | POA: Diagnosis not present

## 2023-01-09 DIAGNOSIS — N1831 Chronic kidney disease, stage 3a: Secondary | ICD-10-CM

## 2023-01-09 DIAGNOSIS — R310 Gross hematuria: Secondary | ICD-10-CM

## 2023-01-09 DIAGNOSIS — I10 Essential (primary) hypertension: Secondary | ICD-10-CM | POA: Diagnosis not present

## 2023-01-09 DIAGNOSIS — I5033 Acute on chronic diastolic (congestive) heart failure: Secondary | ICD-10-CM | POA: Diagnosis not present

## 2023-01-09 DIAGNOSIS — R451 Restlessness and agitation: Secondary | ICD-10-CM | POA: Diagnosis not present

## 2023-01-09 DIAGNOSIS — D649 Anemia, unspecified: Secondary | ICD-10-CM

## 2023-01-09 LAB — BASIC METABOLIC PANEL
BUN: 31 mg/dL — ABNORMAL HIGH (ref 6–23)
CO2: 24 mEq/L (ref 19–32)
Calcium: 10.1 mg/dL (ref 8.4–10.5)
Chloride: 101 mEq/L (ref 96–112)
Creatinine, Ser: 1.21 mg/dL (ref 0.40–1.50)
GFR: 51.74 mL/min — ABNORMAL LOW (ref >60.00)
Glucose, Bld: 116 mg/dL — ABNORMAL HIGH (ref 70–99)
Potassium: 4.3 mEq/L (ref 3.5–5.1)
Sodium: 138 mEq/L (ref 135–145)

## 2023-01-09 LAB — CBC WITH DIFFERENTIAL/PLATELET
Basophils Absolute: 0.2 10*3/uL — ABNORMAL HIGH (ref 0.0–0.1)
Basophils Relative: 1.3 % (ref 0.0–3.0)
Eosinophils Absolute: 0 10*3/uL (ref 0.0–0.7)
Eosinophils Relative: 0.2 % (ref 0.0–5.0)
HCT: 39 % (ref 39.0–52.0)
Hemoglobin: 12.5 g/dL — ABNORMAL LOW (ref 13.0–17.0)
Lymphocytes Relative: 4.6 % — ABNORMAL LOW (ref 12.0–46.0)
Lymphs Abs: 0.6 10*3/uL — ABNORMAL LOW (ref 0.7–4.0)
MCHC: 32.1 g/dL (ref 30.0–36.0)
MCV: 87 fl (ref 78.0–100.0)
Monocytes Absolute: 1.6 10*3/uL — ABNORMAL HIGH (ref 0.1–1.0)
Monocytes Relative: 11.6 % (ref 3.0–12.0)
Neutro Abs: 11.3 10*3/uL — ABNORMAL HIGH (ref 1.4–7.7)
Neutrophils Relative %: 82.3 % — ABNORMAL HIGH (ref 43.0–77.0)
Platelets: 265 10*3/uL (ref 150.0–400.0)
RBC: 4.48 Mil/uL (ref 4.22–5.81)
RDW: 15.8 % — ABNORMAL HIGH (ref 11.5–15.5)
WBC: 13.7 10*3/uL — ABNORMAL HIGH (ref 4.0–10.5)

## 2023-01-09 NOTE — Patient Instructions (Addendum)
      Blood work was ordered.   The lab is on the first floor.    Medications changes include :   decrease lasix to 40 mg alternating with 20 mg daily    A referral was ordered for [facility] nurse coordinator and Child psychotherapist.     Someone will call you to schedule an appointment.

## 2023-01-09 NOTE — Assessment & Plan Note (Signed)
Kidney function decreased in the hospital, but much better when he was discharged BMP today He is currently taking 40 mg of Lasix daily which I think is too much-will decrease to 20 mg alternating with 40 mg daily

## 2023-01-09 NOTE — Assessment & Plan Note (Signed)
Finishes antibiotic today Has follow-up with urology next week Urine looks clear-no need to recheck it today

## 2023-01-09 NOTE — Assessment & Plan Note (Signed)
He had acute on chronic diastolic heart failure and volume overload while in the hospital which may have been related to his diuretic being held and getting IV fluids He did receive IV Lasix and was sent home on Lasix 40 mg daily-prior to hospitalization he was taking 20 mg daily He looks dry today Will get BMP Decrease Lasix to 40 mg alternating with 20 mg-can adjust further depending on blood work and swelling

## 2023-01-09 NOTE — Progress Notes (Signed)
  Care Coordination   Note   01/09/2023 Name: Paul Mays MRN: [id number] DOB: Feb 27, 1928  Paul Mays is a 89 y.o. year old male who sees Burns, Bobette Mo, MD for primary care. I reached out to [name] by phone today to offer care coordination services.  Paul Mays was given information about Care Coordination services today including:   The Care Coordination services include support from the care team which includes your Nurse Coordinator, Clinical Social Worker, or Pharmacist.  The Care Coordination team is here to help remove barriers to the health concerns and goals most important to you. Care Coordination services are voluntary, and the patient may decline or stop services at any time by request to their care team member.   Care Coordination Consent Status: Patient agreed to services and verbal consent obtained.   Follow up plan:  Telephone appointment with care coordination team member scheduled for:  01/12/2023 and 01/13/2023  Encounter Outcome:  Pt. Scheduled from referral   Burman Nieves, [facility] Care Coordination Care Guide Direct Dial: [phone]

## 2023-01-09 NOTE — Assessment & Plan Note (Signed)
Chronic On Eliquis which has been decreased to 2.5 mg twice daily On metoprolol 25 mg twice daily Appears to be in sinus rhythm today Following with cardiology Check CBC

## 2023-01-09 NOTE — Assessment & Plan Note (Signed)
Chronic Blood pressure well-controlled here today Continue hydralazine 25 mg twice daily and metoprolol 25 mg twice daily

## 2023-01-09 NOTE — Patient Outreach (Signed)
Care Coordination   Post-TOC Care Coordination telephone  Visit Note   01/09/2023 Name: Paul Mays MRN: [id number] DOB: Jun 20, 1928  Paul Mays is a 89 y.o. year old male who sees Burns, Bobette Mo, MD for primary care. I spoke with caregiver/ son Paul Mays, verified on [facility] DPR re: [name] by phone today.  What matters to the patients health and wellness today?  Per son/ caregiver Paul Mays: "He is much weaker than he was when I spoke to you on Monday- Dr. Lawerance Bach was stunned how far he has declined since she saw him a couple of weeks ago.  He is in so much pain, you can't even touch him without him hollering.  He is so weak, we are having a hard time taking care of him, he can't bear weight to even do the PT.  I had a heard time transporting him today to the doctor office, I could hardly manage getting him in and out of the car.  I will call [facility] to determine when and if they are going to send the rest of their team out.  So far-- I have only heard from the PT and the bath aide has only come one time"   -- confirmed for caregiver that FL-2 form from 01/04/23 was completed in hospital: encouraged him to follow up with his specific questions about this form with LCSW during call on 01/12/23  -- confirmed disciplines ordered from [facility] health agency at time of hospital discharge per [facility] notes: RN/ PT/ OT/ SW/ aide: so far, they have only seen PT and aide at their home: caregiver reports he has phone number for [facility] agency and will call to follow up on status of other ordered disciplines  -- discussed options around potential resources for transportation, including possible insurance benefit, as caregiver reports patient has active BCBS supplement; confirms he has contact information for insurance company and will call; also provided phone number to private pay medical transport for [facility], per his request for same: [phone]- he states he will call-- he  declines my placing a [employer] referral now, wishes to wait to see what information he obtains from calling insurance provider himself/ and [employer]   SDOH assessments and interventions completed:  Yes Previously completed during TOC call on 01/05/23- verified no changes   Care Coordination Interventions:  Yes, provided   Interventions Today    Flowsheet Row Most Recent Value  Chronic Disease   Chronic disease during today's visit Other  [gross hematuria/ anemia]  General Interventions   General Interventions Discussed/Reviewed General Interventions Discussed, Level of Care, General Interventions Reviewed, Doctor Visits  Doctor Visits Discussed/Reviewed Doctor Visits Reviewed, Specialist  Durable Medical Equipment (DME) Wheelchair  Wheelchair Standard  PCP/Specialist Visits Compliance with follow-up visit  Level of Care Applications, Skilled Nursing Facility  [discussed basics of requirements for SNF/ rehab placement and process-- strongly encouraged son to expect/ take LCSW call as scheduled on Monday 01/12/23]  Applications FL-2  [provided basic education around FL-2 process- encouraged him to speak with LCSW as scheduled for specific information around completion of FL-2 and what to expect]  Exercise Interventions   Exercise Discussed/Reviewed Exercise Discussed  [home health PT: son reports home health PT has been coming to visit patient and he has increasingly become more and more difficult to work with due to increased pain and weakness post-recent hospital discharge]  Education Interventions   Education Provided Provided Education  [need to  maintain engagement/ communication with [facility] CM team,  resources for transportation, need to call insurance provider for specific benefits/ [facility],  talking points to cover during urology provider appointment 4/11 re: foley catheter removal]  Provided Verbal Education On [employer], Other, Applications  [talking points to  cover during upcoming urology provider appointment,  role of home health services,  clarified for caregiver disciplines ordered at time of hospital discharge ([facility]): RN/ SW/ PT/ OT/ AIDE]  Applications FL-2  [provided basic education around FL-2 process- encouraged him to speak with LCSW as scheduled for specific information around completion of FL-2 and what to expect]  Safety Interventions   Safety Discussed/Reviewed Safety Discussed, Home Safety  Home Safety Assistive Devices  [confirmed patient/ caregivers continue using wheelchair for all of patient's ambulation needs- son reports patient completely unable to stand]      Follow up plan: Follow up call scheduled for Monday 01/12/23 with LCSW and Tuesday 01/13/23 with RN CM    Encounter Outcome:  Pt. Visit Completed   Caryl Pina, RN, BSN, CCRN Alumnus RN CM Care Coordination/ Transition of Care- [facility] Care Management [phone]: direct office

## 2023-01-09 NOTE — Assessment & Plan Note (Signed)
Acute on chronic He is much weaker than when I saw him last Unfortunately he has gotten much weaker since being out of the hospital and I am concerned that he will be continued to deteriorate He does have home PT, which will likely not be enough to rehabilitate him He is having difficulty getting out of his wheelchair on his own-he needs assistance Ideally should be in rehab Also having difficulty getting him in and out of the car since he is wheelchair-bound at this time-will likely need medical transport to get to appointments

## 2023-01-09 NOTE — Patient Outreach (Signed)
  Care Coordination   Post-TOC Care Coordination  Visit Note   01/09/2023 Name: Paul Mays MRN: [id number] DOB: May 31, 1928  Paul Mays is a 89 y.o. year old male who sees Burns, Bobette Mo, MD for primary care. I  Received message via EHR from PCP regarding [facility] CM referral placed earlier today  ===View-only below this line=== ----- Message ----- From: Michaela Corner, RN Sent: 01/09/2023  12:30 PM EDT To: Rayfield Citizen, NT; Colletta Maryland, RN; *  Dr. Lawerance Bach-- good thing you ordered the referral for [facility]-- when I spoke with son Paul Mays for [facility] he declined my offer to get the rest of the team involved, stating that he had private duty help at home, and he thought at that time that would be enough  I have copied our RN CM and LCSW on your message, as well as Misty Stanley so she can call to schedule outreaches from both, sooner than later.  Marked the message as urgent so we can go ahead and get started, sooner than later  Glad you could get them to agree to the referral so we can explore options to help patient  Thanks for the message, our team will keep you posted on our efforts  Su Hilt  ----- Message ----- From: Pincus Sanes, MD Sent: 01/09/2023  12:07 PM EDT To: Michaela Corner, RN  Hi Domingos Riggi,  I am not sure if you can help or not.  I just saw Mr. Gamarra and he is significantly worse than when he left the hospital.  He is much weaker and almost wheelchair-bound.  I did order a [facility] referral for care management and social work.  Not sure what the chance of getting him into rehab is at this point.  He does have home PT, but I am not convinced that will be enough.  Thanks for your help. Pincus Sanes, MD   SDOH assessments and interventions completed:  Yes Previously completed during [facility] outreach on 01/05/23   Care Coordination Interventions:  Yes, provided   Interventions Today    Flowsheet Row Most Recent Value  General Interventions   General Interventions Discussed/Reviewed Level of  Care  [Care coordination outreach from/ subsequently with PCP re: [facility] CM referral placed earlier today by PCP,  sent messages to [facility] CM team to facilitate prompt scheduling with [facility] RN CM/ LCSW per PCP request/ message]      Follow up plan:  [facility] CM referral currently in process    Encounter Outcome:  Pt. Visit Completed   Caryl Pina, RN, BSN, CCRN Alumnus RN CM Care Coordination/ Transition of Care- [facility] Care Management [phone]: direct office

## 2023-01-10 NOTE — Assessment & Plan Note (Signed)
Resolved Has urology f/u next week

## 2023-01-12 ENCOUNTER — Other Ambulatory Visit: Payer: Self-pay | Admitting: Internal Medicine

## 2023-01-12 ENCOUNTER — Telehealth: Payer: Self-pay

## 2023-01-12 ENCOUNTER — Ambulatory Visit: Payer: Self-pay | Admitting: Licensed Clinical Social Worker

## 2023-01-12 ENCOUNTER — Telehealth: Payer: Self-pay | Admitting: Internal Medicine

## 2023-01-12 ENCOUNTER — Ambulatory Visit: Payer: Medicare Other | Admitting: Physician Assistant

## 2023-01-12 DIAGNOSIS — I130 Hypertensive heart and chronic kidney disease with heart failure and stage 1 through stage 4 chronic kidney disease, or unspecified chronic kidney disease: Secondary | ICD-10-CM | POA: Diagnosis not present

## 2023-01-12 DIAGNOSIS — I2510 Atherosclerotic heart disease of native coronary artery without angina pectoris: Secondary | ICD-10-CM

## 2023-01-12 DIAGNOSIS — N3001 Acute cystitis with hematuria: Secondary | ICD-10-CM | POA: Diagnosis not present

## 2023-01-12 DIAGNOSIS — N183 Chronic kidney disease, stage 3 unspecified: Secondary | ICD-10-CM | POA: Diagnosis not present

## 2023-01-12 DIAGNOSIS — C61 Malignant neoplasm of prostate: Secondary | ICD-10-CM

## 2023-01-12 DIAGNOSIS — I5032 Chronic diastolic (congestive) heart failure: Secondary | ICD-10-CM | POA: Diagnosis not present

## 2023-01-12 DIAGNOSIS — B952 Enterococcus as the cause of diseases classified elsewhere: Secondary | ICD-10-CM | POA: Diagnosis not present

## 2023-01-12 DIAGNOSIS — I480 Paroxysmal atrial fibrillation: Secondary | ICD-10-CM | POA: Diagnosis not present

## 2023-01-12 DIAGNOSIS — I422 Other hypertrophic cardiomyopathy: Secondary | ICD-10-CM

## 2023-01-12 NOTE — Telephone Encounter (Signed)
HH ORDERS   Caller Name: [facility] Agency Name: [facility] Callback Phone #: [phone](secure)  Service Requested: Home health nursing (examples: OT/PT/Skilled Nursing/Social Work/Speech Therapy/Wound Care)

## 2023-01-12 NOTE — Telephone Encounter (Signed)
Verbals given today. °

## 2023-01-12 NOTE — Patient Outreach (Signed)
  Care Coordination   Initial Visit Note   01/12/2023 Name: Paul Mays MRN: [id number] DOB: March 21, 1928  Paul Mays is a 89 y.o. year old male who sees Burns, Bobette Mo, MD for primary care. I spoke with  Mardene Celeste Violette's sons Aurther Loft and [name]  by phone today.  What matters to the patients health and wellness today?  Getting rehab for patient  Per sons patient has declined since leaving the hospital 12/25/22.  They would him to have rehab.  [facility] has come to the home and they would like to see how patient does with Home PT before moving forward with facility PT.   Also reviewed transportation options for upcoming appointment. Patient has around the clock care via private pay. Per son all needs are currently being met at this time.  LCSW will f/u in 10 days to see if they would like to move forward with facility placement for Rehab.  Informed family the Medicare 3 night stay is good for 30 days as it relates to placement.   Goals Addressed             This Visit's Progress    Rehab at home or facility       Activities and task to complete in order to accomplish goals.  Sons will assist with task Call [facility] and insurance provider [employer] to follow up on Transportation options due to limitations  Keep all upcoming appointment discussed today Call me if you would like to move forward with placement           SDOH assessments and interventions completed:  Yes  SDOH Interventions Today    Flowsheet Row Most Recent Value  SDOH Interventions   Transportation Interventions Payor Benefit, Other (Comment)  Financial Strain Interventions Intervention Not Indicated      Care Coordination Interventions:  Yes, provided  Interventions Today    Flowsheet Row Most Recent Value  Chronic Disease   Chronic disease during today's visit Hypertension (HTN), Congestive Heart Failure (CHF), Chronic Kidney Disease/End Stage Renal Disease (ESRD)  General Interventions    General Interventions Discussed/Reviewed General Interventions Discussed, [facility], Level of Care  [reminded son to call [facility]]  Level of Care Skilled Nursing Facility  [for Short term Rehab]  Education Interventions   Education Provided Provided Education  [review placement process from home, discussed collaboration needed with [facility] PT and the faciltiy]  Mental Health Interventions   Mental Health Discussed/Reviewed --  Teacher, English as a foreign language stress]  Advanced Directive Interventions   Advanced Directives Discussed/Reviewed Advanced Directives Discussed, Advanced Care Planning       Follow up plan: Follow up call scheduled for 01/22/23    Encounter Outcome:  Pt. Visit Completed    Sammuel Hines, LCSW Social Work Care Coordination  [facility] [employer] [employer] [phone]

## 2023-01-12 NOTE — Telephone Encounter (Signed)
(  4:14 pm) PC SW scheduled and initial visit for patient with his son. Patient scheduled with palliative care RN for 01/13/23@ 2:30 pm.

## 2023-01-12 NOTE — Patient Instructions (Signed)
Visit Information  Thank you for taking time to visit with me today. Please don't hesitate to contact me if I can be of assistance to you.   Following are the goals we discussed today:   Goals Addressed             This Visit's Progress    Rehab at home or facility       Activities and task to complete in order to accomplish goals.  Sons will assist with task Call [facility] and insurance provider [employer] to follow up on Transportation options due to limitations  Keep all upcoming appointment discussed today Call me if you would like to move forward with placement           Our next appointment is by telephone on 01/22/23 at 10:00  Please call the care guide team at [phone] if you need to cancel or reschedule your appointment.    The patient verbalized understanding of instructions, educational materials, and care plan provided today and DECLINED offer to receive copy of patient instructions, educational materials, and care plan.   Sammuel Hines, LCSW Social Work Care Coordination  [facility] [employer] [phone]

## 2023-01-12 NOTE — Telephone Encounter (Signed)
Okay for orders? 

## 2023-01-13 ENCOUNTER — Ambulatory Visit: Payer: Self-pay

## 2023-01-13 ENCOUNTER — Telehealth: Payer: Self-pay | Admitting: Internal Medicine

## 2023-01-13 ENCOUNTER — Other Ambulatory Visit: Payer: Self-pay

## 2023-01-13 VITALS — BP 130/80 | HR 85 | Resp 18

## 2023-01-13 DIAGNOSIS — B952 Enterococcus as the cause of diseases classified elsewhere: Secondary | ICD-10-CM | POA: Diagnosis not present

## 2023-01-13 DIAGNOSIS — I5032 Chronic diastolic (congestive) heart failure: Secondary | ICD-10-CM | POA: Diagnosis not present

## 2023-01-13 DIAGNOSIS — N3001 Acute cystitis with hematuria: Secondary | ICD-10-CM | POA: Diagnosis not present

## 2023-01-13 DIAGNOSIS — I480 Paroxysmal atrial fibrillation: Secondary | ICD-10-CM | POA: Diagnosis not present

## 2023-01-13 DIAGNOSIS — N183 Chronic kidney disease, stage 3 unspecified: Secondary | ICD-10-CM | POA: Diagnosis not present

## 2023-01-13 DIAGNOSIS — I130 Hypertensive heart and chronic kidney disease with heart failure and stage 1 through stage 4 chronic kidney disease, or unspecified chronic kidney disease: Secondary | ICD-10-CM | POA: Diagnosis not present

## 2023-01-13 DIAGNOSIS — Z515 Encounter for palliative care: Secondary | ICD-10-CM

## 2023-01-13 NOTE — Patient Instructions (Addendum)
Visit Information  Thank you for taking time to visit with me today. Please don't hesitate to contact me if I can be of assistance to you.   Following are the goals we discussed today:   Goals Addressed             This Visit's Progress    Care coordination Activities post hospitalization/level of care needs       Interventions Today    Flowsheet Row Most Recent Value  Chronic Disease   Chronic disease during today's visit Hypertension (HTN), Congestive Heart Failure (CHF), Other  [post hospitalization 12/27/22-01/04/23 hematuria, a/c CHF]  General Interventions   General Interventions Discussed/Reviewed General Interventions Discussed, Level of Care, Communication with, Community Resources  [Provided Care Coordination contact number to call care coordination needs. advised [facility] provider will usually call day prior to arrange time of visit]  Doctor Visits Discussed/Reviewed Doctor Visits Discussed  [reviewed upcoming appointment. patient has appointment with urologist this Thursday(indwelling folley)]  PCP/Specialist Visits Compliance with follow-up visit  Communication with --  [facility] contacted [facility] spoke with Deanna regarding sons frustration. Deanna reports will contact sons to discuss further and reiterate plan.]  Level of Care Skilled Nursing Facility, Personal Care Services  [son reports around the clock care for patient and his wife. sons state they want to try to keep patient home, but if unable to progress over the week or so will seek rehab at a facility.]  Exercise Interventions   Exercise Discussed/Reviewed Exercise Discussed  [discussed importance of performing exercises recommended by home health therapist. importance of maintaining muscle strength/tone.]  Education Interventions   Education Provided Provided Education  Provided Verbal Education On When to see the doctor, Other  [discussed daily weights, importance in managing HF. Reviewed instructions from PCP office  visit, discussed importance of healthy eating. discussed palliative care focus on quality of life/support.]  Nutrition Interventions   Nutrition Discussed/Reviewed Nutrition Discussed  Pharmacy Interventions   Pharmacy Dicussed/Reviewed Pharmacy Topics Discussed            Our next appointment is by telephone on 01/19/23 at 3:30 pm  Please call the care guide team at [phone] if you need to cancel or reschedule your appointment.   If you are experiencing a Mental Health or Behavioral Health Crisis or need someone to talk to, please call the Suicide and Crisis Lifeline: 19  Kathyrn Sheriff, RN, MSN, BSN, CCM [facility] Care Coordinator [phone]

## 2023-01-13 NOTE — Telephone Encounter (Signed)
HH ORDERS   Caller Name: [facility] Agency Name: [facility]ork Phone #: [phone](secure)  Service Requested: skilled nurse (examples: OT/PT/Skilled Nursing/Social Work/Speech Therapy/Wound Care)  Frequency of Visits: 2 week 3, 1 week 3  Also requested: Medical social work consult Xeroform guaze for leg wound

## 2023-01-13 NOTE — Telephone Encounter (Signed)
Okay for orders? 

## 2023-01-13 NOTE — Patient Outreach (Signed)
  Care Coordination   Initial Visit Note   01/13/2023 Name: Paul Mays MRN: [id number] DOB: Jul 18, 1928  Paul Mays is a 89 y.o. year old male who sees Burns, Bobette Mo, MD for primary care. I spoke with both sons (care givers/dpr), Paul Mays and Paul Mays by phone today.  What matters to the patients health and wellness today?  Post hospitalization 12/27/22-01/04/23-hematuria, a/c CHF.  Sons express great frustration and discontentment with home health. Reports that bath aid was canceled today. Reports getting a routine schedule with home health agency and being able to count on the agency to provide services is a priority. They express they want to try to keep Paul Mays home, but if he is not able to progress at home then they will seek rehab at a facility.  Goals Addressed             This Visit's Progress    Care coordination Activities post hospitalization/level of care needs       Interventions Today    Flowsheet Row Most Recent Value  Chronic Disease   Chronic disease during today's visit Hypertension (HTN), Congestive Heart Failure (CHF), Other  [post hospitalization 12/27/22-01/04/23 hematuria, a/c CHF]  General Interventions   General Interventions Discussed/Reviewed General Interventions Discussed, Level of Care, Communication with, Community Resources  [Provided Care Coordination contact number to call care coordination needs. advised [facility] provider will usually call day prior to arrange time of visit]  Doctor Visits Discussed/Reviewed Doctor Visits Discussed  [reviewed upcoming appointment. patient has appointment with urologist this Thursday(indwelling folley)]  PCP/Specialist Visits Compliance with follow-up visit  Communication with --  [facility] contacted [facility] spoke with Deanna regarding sons frustration. Deanna reports will contact sons to discuss further and reiterate plan.]  Level of Care Skilled Nursing Facility, Personal Care Services  [son reports around the clock  care for patient and his wife. sons state they want to try to keep patient home, but if unable to progress over the week or so will seek rehab at a facility.]  Exercise Interventions   Exercise Discussed/Reviewed Exercise Discussed  [discussed importance of performing exercises recommended by home health therapist. importance of maintaining muscle strength/tone.]  Education Interventions   Education Provided Provided Education  Provided Verbal Education On When to see the doctor, Other  [discussed daily weights, importance in managing HF. Reviewed instructions from PCP office visit, discussed importance of healthy eating. discussed palliative care focus on quality of life/support.]  Nutrition Interventions   Nutrition Discussed/Reviewed Nutrition Discussed  Pharmacy Interventions   Pharmacy Dicussed/Reviewed Pharmacy Topics Discussed            SDOH assessments and interventions completed:  No  Care Coordination Interventions:  Yes, provided   Follow up plan: Follow up call scheduled for 01/19/23    Encounter Outcome:  Pt. Visit Completed   Paul Sheriff, RN, MSN, BSN, CCM [facility] Care Coordinator [phone]

## 2023-01-14 DIAGNOSIS — N3001 Acute cystitis with hematuria: Secondary | ICD-10-CM | POA: Diagnosis not present

## 2023-01-14 DIAGNOSIS — I130 Hypertensive heart and chronic kidney disease with heart failure and stage 1 through stage 4 chronic kidney disease, or unspecified chronic kidney disease: Secondary | ICD-10-CM | POA: Diagnosis not present

## 2023-01-14 DIAGNOSIS — B952 Enterococcus as the cause of diseases classified elsewhere: Secondary | ICD-10-CM | POA: Diagnosis not present

## 2023-01-14 DIAGNOSIS — N183 Chronic kidney disease, stage 3 unspecified: Secondary | ICD-10-CM | POA: Diagnosis not present

## 2023-01-14 DIAGNOSIS — I5032 Chronic diastolic (congestive) heart failure: Secondary | ICD-10-CM | POA: Diagnosis not present

## 2023-01-14 DIAGNOSIS — I480 Paroxysmal atrial fibrillation: Secondary | ICD-10-CM | POA: Diagnosis not present

## 2023-01-14 NOTE — Telephone Encounter (Signed)
Noted  

## 2023-01-14 NOTE — Telephone Encounter (Signed)
Home Health called back and states that they are going to have to use vaseline gauze until the other product comes in.

## 2023-01-15 ENCOUNTER — Telehealth: Payer: Self-pay | Admitting: Internal Medicine

## 2023-01-15 DIAGNOSIS — R310 Gross hematuria: Secondary | ICD-10-CM | POA: Diagnosis not present

## 2023-01-15 DIAGNOSIS — Z8546 Personal history of malignant neoplasm of prostate: Secondary | ICD-10-CM | POA: Diagnosis not present

## 2023-01-15 DIAGNOSIS — N430 Encysted hydrocele: Secondary | ICD-10-CM | POA: Diagnosis not present

## 2023-01-15 DIAGNOSIS — R338 Other retention of urine: Secondary | ICD-10-CM | POA: Diagnosis not present

## 2023-01-15 NOTE — Telephone Encounter (Signed)
Spoke with son today and info given.  He asked me to reach out to Gavin Pound, Child psychotherapist with [facility] through [facility] for him to find out what process was.  I called her today and left message at [phone] however she was gone for the day.  Called patient's son back and informed him call had been placed. Also advised him to call her back tomorrow due to her hours being 8:00 am - 12:00 pm on Fridays to follow up.

## 2023-01-15 NOTE — Telephone Encounter (Signed)
Please call him-let him know the trying to get him into a rehab place from an outpatient setting can take time and it will not be quick.  If he is rapidly deteriorating then he needs to go to the emergency room because if he was to go directly into rehab they may send him there anyways.

## 2023-01-15 NOTE — Telephone Encounter (Signed)
Son:  Lyndle Virgen, Junior -   [phone]  Called and states that patient is deteriorating rapidly and son would like for you to give him a call to see about getting him into a rehab facility.  Please call at your convenience.

## 2023-01-16 ENCOUNTER — Ambulatory Visit: Payer: Self-pay | Admitting: Licensed Clinical Social Worker

## 2023-01-16 ENCOUNTER — Telehealth: Payer: Self-pay | Admitting: Internal Medicine

## 2023-01-16 ENCOUNTER — Encounter (HOSPITAL_COMMUNITY): Payer: Self-pay

## 2023-01-16 ENCOUNTER — Other Ambulatory Visit: Payer: Self-pay

## 2023-01-16 ENCOUNTER — Emergency Department (HOSPITAL_COMMUNITY): Payer: Medicare Other

## 2023-01-16 ENCOUNTER — Emergency Department (HOSPITAL_COMMUNITY)
Admission: EM | Admit: 2023-01-16 | Discharge: 2023-01-20 | Disposition: A | Discharge status: Skilled Nursing Facility | Payer: Medicare Other | Attending: Emergency Medicine | Admitting: Emergency Medicine

## 2023-01-16 DIAGNOSIS — I2510 Atherosclerotic heart disease of native coronary artery without angina pectoris: Secondary | ICD-10-CM | POA: Diagnosis not present

## 2023-01-16 DIAGNOSIS — I130 Hypertensive heart and chronic kidney disease with heart failure and stage 1 through stage 4 chronic kidney disease, or unspecified chronic kidney disease: Secondary | ICD-10-CM | POA: Diagnosis not present

## 2023-01-16 DIAGNOSIS — B952 Enterococcus as the cause of diseases classified elsewhere: Secondary | ICD-10-CM | POA: Diagnosis not present

## 2023-01-16 DIAGNOSIS — N183 Chronic kidney disease, stage 3 unspecified: Secondary | ICD-10-CM | POA: Diagnosis not present

## 2023-01-16 DIAGNOSIS — R5381 Other malaise: Secondary | ICD-10-CM | POA: Diagnosis not present

## 2023-01-16 DIAGNOSIS — I480 Paroxysmal atrial fibrillation: Secondary | ICD-10-CM | POA: Insufficient documentation

## 2023-01-16 DIAGNOSIS — Z8546 Personal history of malignant neoplasm of prostate: Secondary | ICD-10-CM | POA: Insufficient documentation

## 2023-01-16 DIAGNOSIS — I5032 Chronic diastolic (congestive) heart failure: Secondary | ICD-10-CM | POA: Diagnosis not present

## 2023-01-16 DIAGNOSIS — M6281 Muscle weakness (generalized): Secondary | ICD-10-CM | POA: Insufficient documentation

## 2023-01-16 DIAGNOSIS — N3001 Acute cystitis with hematuria: Secondary | ICD-10-CM | POA: Diagnosis not present

## 2023-01-16 DIAGNOSIS — R2689 Other abnormalities of gait and mobility: Secondary | ICD-10-CM | POA: Insufficient documentation

## 2023-01-16 DIAGNOSIS — R531 Weakness: Secondary | ICD-10-CM | POA: Diagnosis not present

## 2023-01-16 DIAGNOSIS — R197 Diarrhea, unspecified: Secondary | ICD-10-CM

## 2023-01-16 DIAGNOSIS — R2681 Unsteadiness on feet: Secondary | ICD-10-CM | POA: Insufficient documentation

## 2023-01-16 DIAGNOSIS — R5383 Other fatigue: Secondary | ICD-10-CM | POA: Diagnosis not present

## 2023-01-16 LAB — CBC WITH DIFFERENTIAL/PLATELET
Abs Immature Granulocytes: 0.01 10*3/uL (ref 0.00–0.07)
Basophils Absolute: 0.1 10*3/uL (ref 0.0–0.1)
Basophils Relative: 1 %
Eosinophils Absolute: 0.1 10*3/uL (ref 0.0–0.5)
Eosinophils Relative: 1 %
HCT: 39.8 % (ref 39.0–52.0)
Hemoglobin: 12.1 g/dL — ABNORMAL LOW (ref 13.0–17.0)
Immature Granulocytes: 0 %
Lymphocytes Relative: 10 %
Lymphs Abs: 0.8 10*3/uL (ref 0.7–4.0)
MCH: 27.4 pg (ref 26.0–34.0)
MCHC: 30.4 g/dL (ref 30.0–36.0)
MCV: 90.2 fL (ref 80.0–100.0)
Monocytes Absolute: 0.9 10*3/uL (ref 0.1–1.0)
Monocytes Relative: 12 %
Neutro Abs: 6.2 10*3/uL (ref 1.7–7.7)
Neutrophils Relative %: 76 %
Platelets: 283 10*3/uL (ref 150–400)
RBC: 4.41 MIL/uL (ref 4.22–5.81)
RDW: 14.5 % (ref 11.5–15.5)
WBC: 8 10*3/uL (ref 4.0–10.5)
nRBC: 0 % (ref 0.0–0.2)

## 2023-01-16 LAB — URINALYSIS, W/ REFLEX TO CULTURE (INFECTION SUSPECTED)
Bacteria, UA: NONE SEEN
Bilirubin Urine: NEGATIVE
Glucose, UA: NEGATIVE mg/dL
Ketones, ur: NEGATIVE mg/dL
Leukocytes,Ua: NEGATIVE
Nitrite: NEGATIVE
Protein, ur: NEGATIVE mg/dL
Specific Gravity, Urine: 1.011 (ref 1.005–1.030)
pH: 6 (ref 5.0–8.0)

## 2023-01-16 LAB — COMPREHENSIVE METABOLIC PANEL
ALT: 27 U/L (ref 0–44)
AST: 28 U/L (ref 15–41)
Albumin: 3.6 g/dL (ref 3.5–5.0)
Alkaline Phosphatase: 60 U/L (ref 38–126)
Anion gap: 14 (ref 5–15)
BUN: 34 mg/dL — ABNORMAL HIGH (ref 8–23)
CO2: 24 mmol/L (ref 22–32)
Calcium: 9.3 mg/dL (ref 8.9–10.3)
Chloride: 102 mmol/L (ref 98–111)
Creatinine, Ser: 1.18 mg/dL (ref 0.61–1.24)
GFR, Estimated: 58 mL/min — ABNORMAL LOW (ref >60)
Glucose, Bld: 103 mg/dL — ABNORMAL HIGH (ref 70–99)
Potassium: 4.5 mmol/L (ref 3.5–5.1)
Sodium: 140 mmol/L (ref 135–145)
Total Bilirubin: 1 mg/dL (ref 0.3–1.2)
Total Protein: 7.2 g/dL (ref 6.5–8.1)

## 2023-01-16 MED ORDER — QUETIAPINE FUMARATE 50 MG PO TABS
25.0000 mg | ORAL_TABLET | Freq: Every evening | ORAL | Status: DC | PRN
  Administered 2023-01-16 – 2023-01-20 (×2): 25 mg via ORAL
  Filled 2023-01-16 (×3): qty 1

## 2023-01-16 MED ORDER — HYDRALAZINE HCL 25 MG PO TABS
25.0000 mg | ORAL_TABLET | Freq: Two times a day (BID) | ORAL | Status: DC
  Administered 2023-01-16 – 2023-01-19 (×5): 25 mg via ORAL
  Filled 2023-01-16 (×7): qty 1

## 2023-01-16 MED ORDER — FUROSEMIDE 40 MG PO TABS
40.0000 mg | ORAL_TABLET | Freq: Every day | ORAL | Status: DC
  Administered 2023-01-16 – 2023-01-19 (×4): 40 mg via ORAL
  Filled 2023-01-16 (×5): qty 1

## 2023-01-16 MED ORDER — METOPROLOL TARTRATE 25 MG PO TABS
25.0000 mg | ORAL_TABLET | Freq: Two times a day (BID) | ORAL | Status: DC
  Administered 2023-01-16 – 2023-01-19 (×5): 25 mg via ORAL
  Filled 2023-01-16 (×7): qty 1

## 2023-01-16 MED ORDER — APIXABAN 2.5 MG PO TABS
2.5000 mg | ORAL_TABLET | Freq: Two times a day (BID) | ORAL | Status: DC
  Administered 2023-01-16 – 2023-01-19 (×5): 2.5 mg via ORAL
  Filled 2023-01-16 (×7): qty 1

## 2023-01-16 MED ORDER — ACETAMINOPHEN 325 MG PO TABS
650.0000 mg | ORAL_TABLET | Freq: Four times a day (QID) | ORAL | Status: DC | PRN
  Administered 2023-01-16: 650 mg via ORAL
  Filled 2023-01-16: qty 2

## 2023-01-16 MED ORDER — PRAVASTATIN SODIUM 20 MG PO TABS
40.0000 mg | ORAL_TABLET | Freq: Every day | ORAL | Status: DC
  Administered 2023-01-16 – 2023-01-19 (×4): 40 mg via ORAL
  Filled 2023-01-16 (×5): qty 2

## 2023-01-16 MED ORDER — OXYBUTYNIN CHLORIDE 5 MG PO TABS: 10.0000 mg | ORAL_TABLET | Freq: Three times a day (TID) | ORAL | Status: DC | PRN

## 2023-01-16 MED ORDER — SODIUM CHLORIDE 0.9 % IV BOLUS
1000.0000 mL | Freq: Once | INTRAVENOUS | Status: AC
  Administered 2023-01-16: 1000 mL via INTRAVENOUS

## 2023-01-16 NOTE — ED Provider Notes (Signed)
EMERGENCY DEPARTMENT AT [facility] Provider Note   CSN: [id number] Arrival date & time: 01/16/23  1541     History  Chief Complaint  Patient presents with   Weakness    Paul Mays is a 89 y.o. male hx of afib on eliquis, recent hematuria, CAD, heart failure here presenting with weakness.  Patient was recently admitted for gross hematuria and to have a UTI.  Patient was supposed to go to rehab facility but apparently went home.  Patient did not do well at home.  He has been persistently weak and has hard time walking.  Patient denies any new falls.  Patient has persistent urinary frequency and some diarrhea.  Dr. Lawerance Bach who is his outpatient doctor tried to place him but was unable to place him to a rehab facility.  Patient was sent here for rehab placement  The history is provided by the patient.       Home Medications Prior to Admission medications   Medication Sig Start Date End Date Taking? Authorizing Provider  acetaminophen (TYLENOL) 325 MG tablet Take 2 tablets (650 mg total) by mouth every 6 (six) hours as needed for mild pain (or Fever >/= 101). 01/01/23   Rodolph Bong, MD  allopurinol (ZYLOPRIM) 300 MG tablet TAKE 1 TABLET(300 MG) BY MOUTH DAILY AS NEEDED FOR GOUT FLARE Patient taking differently: Take 300 mg by mouth daily as needed (gout flare). 10/30/22   Pincus Sanes, MD  amoxicillin (AMOXIL) 500 MG tablet Take 4 tablets (2,000 mg total) by mouth as directed. 1 HOUR PRIOR TO DENTAL APPOINTMENTS 11/20/21   Janetta Hora, PA-C  apixaban (ELIQUIS) 2.5 MG TABS tablet Take 1 tablet (2.5 mg total) by mouth 2 (two) times daily. 01/01/23   Rodolph Bong, MD  Cholecalciferol (VITAMIN D3) 50 MCG (2000 UT) capsule Take 2,000 Units by mouth daily.    [provider]  furosemide (LASIX) 40 MG tablet Take 1 tablet (40 mg total) by mouth daily. 01/02/23   Rodolph Bong, MD  gabapentin (NEURONTIN) 600 MG tablet Take 1 tablet (600 mg  total) by mouth at bedtime. 09/30/22   Etta Grandchild, MD  hydrALAZINE (APRESOLINE) 25 MG tablet TAKE 1 TABLET(25 MG) BY MOUTH IN THE MORNING AND AT BEDTIME Patient taking differently: Take 25 mg by mouth 2 (two) times daily. 01/21/22   Tonny Bollman, MD  iron polysaccharides (NIFEREX) 150 MG capsule Take 1 capsule (150 mg total) by mouth daily. 09/01/22   Johney Maine, MD  metoprolol tartrate (LOPRESSOR) 25 MG tablet Take 1 tablet (25 mg total) by mouth 2 (two) times daily. 03/08/21   Pincus Sanes, MD  oxybutynin (DITROPAN) 5 MG tablet Take 2 tablets (10 mg total) by mouth every 8 (eight) hours as needed for bladder spasms. 01/01/23   Rodolph Bong, MD  potassium chloride (KLOR-CON M) 10 MEQ tablet Take 1 tablet by mouth daily (with Furosemide) 10/28/22   Tonny Bollman, MD  pravastatin (PRAVACHOL) 40 MG tablet Take 1 tablet (40 mg total) by mouth daily. 07/07/22   Pincus Sanes, MD  QUEtiapine (SEROQUEL) 25 MG tablet Take 1 tablet (25 mg total) by mouth at bedtime as needed (agitation). 01/01/23   Rodolph Bong, MD  vitamin B-12 (CYANOCOBALAMIN) 500 MCG tablet Take 500 mcg by mouth daily.    [provider]      Allergies    Lisinopril and Spironolactone    Review of Systems  Review of Systems  Neurological:  Positive for weakness.  All other systems reviewed and are negative.   Physical Exam Updated Vital Signs BP (!) 155/79   Pulse 75   Temp 98.1 F (36.7 C) (Oral)   Resp 16   Ht 5\' 4"  (1.626 m)   Wt 69.9 kg   SpO2 95%   BMI 26.43 kg/m  Physical Exam Vitals and nursing note reviewed.  Constitutional:      Comments: Chronically ill  HENT:     Head: Normocephalic.     Nose: Nose normal.     Mouth/Throat:     Mouth: Mucous membranes are moist.  Eyes:     Extraocular Movements: Extraocular movements intact.     Pupils: Pupils are equal, round, and reactive to light.  Cardiovascular:     Rate and Rhythm: Normal rate and regular rhythm.      Pulses: Normal pulses.     Heart sounds: Normal heart sounds.  Pulmonary:     Effort: Pulmonary effort is normal.     Breath sounds: Normal breath sounds.  Abdominal:     General: Abdomen is flat.     Palpations: Abdomen is soft.  Musculoskeletal:        General: Normal range of motion.     Cervical back: Normal range of motion.  Skin:    General: Skin is warm.     Capillary Refill: Capillary refill takes less than 2 seconds.  Neurological:     General: No focal deficit present.     Mental Status: He is alert and oriented to person, place, and time.     Comments: Strength is 4 out of 5 bilateral arms and legs  Psychiatric:        Mood and Affect: Mood normal.        Behavior: Behavior normal.     ED Results / Procedures / Treatments   Labs (all labs ordered are listed, but only abnormal results are displayed) Labs Reviewed  C DIFFICILE QUICK SCREEN W PCR REFLEX    CBC WITH DIFFERENTIAL/PLATELET  COMPREHENSIVE METABOLIC PANEL  URINALYSIS, W/ REFLEX TO CULTURE (INFECTION SUSPECTED)    EKG EKG Interpretation  Date/Time:  Friday January 16 2023 15:52:11 EDT Ventricular Rate:  88 PR Interval:    QRS Duration: 104 QT Interval:  372 QTC Calculation: 451 R Axis:   -39 Text Interpretation: Atrial fibrillation Left axis deviation Abnormal R-wave progression, late transition Nonspecific repol abnormality, diffuse leads No significant change since last tracing Confirmed by Richardean Canal [id number]) on 01/16/2023 4:05:08 PM Also confirmed by Richardean Canal [phone]), editor Erenest Rasher ([id number])  on 01/16/2023 4:05:57 PM  Radiology No results found.  Procedures Procedures    Medications Ordered in ED Medications  sodium chloride 0.9 % bolus 1,000 mL (has no administration in time range)    ED Course/ Medical Decision Making/ A&P                             Medical Decision Making Paul Mays is a 89 y.o. male here presenting with weakness and inability to care for himself.   Patient was recently admitted for hematuria.  He states that he was supposed to go to rehab but instead came home and he is too weak to walk and unable to care for himself.  Consider electrolyte abnormality versus dehydration versus persistent UTI.  Plan to get CBC and CMP and urinalysis and hydrate  patient.  Will also consult social work   7:09 PM I reviewed patient's labs and independently interpreted chest x-ray.  Patient has unremarkable CBC and CMP and urinalysis showed no UTI.  Chest x-ray showed improved pulmonary edema.  Social work was consulted.  I recommend PT and OT evaluation.  Since patient is unable to walk, he likely will need rehab.  Problems Addressed: Physical deconditioning: acute illness or injury  Amount and/or Complexity of Data Reviewed Labs: ordered. Decision-making details documented in ED Course. Radiology: ordered and independent interpretation performed. Decision-making details documented in ED Course. ECG/medicine tests: ordered and independent interpretation performed. Decision-making details documented in ED Course.    Final Clinical Impression(s) / ED Diagnoses Final diagnoses:  None    Rx / DC Orders ED Discharge Orders     None         Charlynne Pander, MD 01/16/23 1910

## 2023-01-16 NOTE — Progress Notes (Signed)
TOC CSW consulted with pts son, Bruce, [place] [phone].  Jadarrian has agreed to send pt to SNF.  There preference is [facility], [facility], or [facility].  CSW inform Ardi that we will make an attempt to get a bed offer from one of the following, but please do not be disappointed if this is not accomplished.  CSW explained there are a lot of things that should be considered such as, staffing to accommodate pt, an available bed, etc.  Chrissie Noa expressed understanding.  Rommel gave permission to send pts info out for bed offers.  CSW explained that once we receive bed offers they will then be presented to you Chrissie Noa).  Tywayne expressed understanding.   Trennon Torbeck Tarpley-Carter, MSW, LCSW-A Pronouns:  She/Her/Hers [facility] Clinical Social Worker Direct Number:  [phone] [email or web address]

## 2023-01-16 NOTE — Patient Outreach (Signed)
  Care Coordination  Follow Up Visit Note   01/16/2023 Name: [name] MRN: [id number] DOB: 06-Mar-1928  Paul Mays is a 89 y.o. year old male who sees Burns, Bobette Mo, MD for primary care. I spoke with  Mardene Celeste Shober's son [name] by phone today.  What matters to the patients health and wellness today?  Getting patient placed for short term rehab  LCSW received voice message from patient's son and from PCP's CMA.   Patient continues to decondition.  Visit from [facility]  today with recommendation for rehab placement.  Son also waiting for [facility] RN to do home visit today around 2:00.    FL2 and supporting documentation faxed to [facility].  HHPT will fax their recommendation today as well.    Goals Addressed             This Visit's Progress    Rehab at home or facility       Activities and task to complete in order to accomplish goals.  Sons will assist with task Call [facility] and insurance provider [employer] to follow up on Transportation options due to limitations  Keep all upcoming appointment discussed today Review facilities from the list provided call and go visit Let me know which facilities you choose ( please provided me with the name, phone number and fax number)   Per your request I have faxed the FL2 and other information to [facility] and working with Alphonzo Lemmings the social work for available beds next week. [name]'s cell phone   [phone]        SDOH assessments and interventions completed:  No   Care Coordination Interventions:  Yes, provided  Interventions Today    Flowsheet Row Most Recent Value  Chronic Disease   Chronic disease during today's visit Hypertension (HTN), Congestive Heart Failure (CHF), Chronic Kidney Disease/End Stage Renal Disease (ESRD)  General Interventions   General Interventions Discussed/Reviewed Communication with, Level of Care  Communication with PCP/Specialists  [CMA,  Social work Contract[facility] at [place], calls made to other  faciliites]  Level of Care Skilled Nursing Facility  Applications FL-2  [faxed FL2 and all supporting information to requested facilites per son Bill]  Education Interventions   Applications FL-2  [faxed FL2 and all supporting information to requested facilites per son Bill]       Follow up plan: Follow up call scheduled for 01/22/23 with patient's son, however will continue to collaborate with family and facility as needed.    Encounter Outcome:  Pt. Visit Completed   Sammuel Hines, LCSW Social Work Care Coordination  [facility] Emmie N[facility] [employer] [phone]

## 2023-01-16 NOTE — Telephone Encounter (Signed)
Spoke with patient's son.  He was on his way to the ED since he was not doing well.

## 2023-01-16 NOTE — Patient Instructions (Signed)
Visit Information  Thank you for taking time to visit with me today. Please don't hesitate to contact me if I can be of assistance to you.   Following are the goals we discussed today:   Goals Addressed             This Visit's Progress    Rehab at home or facility       Activities and task to complete in order to accomplish goals.  Sons will assist with task Call [facility] and insurance provider [employer] to follow up on Transportation options due to limitations  Keep all upcoming appointment discussed today Review facilities from the list provided call and go visit Let me know which facilities you choose ( please provided me with the name, phone number and fax number)   Per your request I have faxed the FL2 and other information to [facility] and working with Alphonzo Lemmings the social work for available beds next week. [name]'s cell phone   [phone]        Our next appointment is by telephone on 01/22/23  Please call the care guide team at [phone] if you need to cancel or reschedule your appointment.    The patient verbalized understanding of instructions, educational materials, and care plan provided today and DECLINED offer to receive copy of patient instructions, educational materials, and care plan.   Sammuel Hines, LCSW Social Work Care Coordination  [facility] [employer] [phone]

## 2023-01-16 NOTE — Telephone Encounter (Signed)
Because of his recent use of antibiotics we should rule out C. difficile.  Ideally should get a stool sample and check it.  I can order for lab-someone would need to come get the container and collect the stool sample and bring to the lab.  If he does have C. difficile which is secondary to the antibiotics he should not take anything to stop the diarrhea.

## 2023-01-16 NOTE — Progress Notes (Signed)
Pts PASRR is as follows:  [phone] A  Richel Millspaugh Tarpley-Carter, MSW, LCSW-A Pronouns:  She/Her/Hers [facility] Clinical Social Worker Direct Number:  [phone] [email or web address]

## 2023-01-16 NOTE — ED Triage Notes (Signed)
Patient BIB [facility] from home. Discharged from the hospital 2 weeks ago. Has had limited mobility and unable to care for self since. Wants to be placed in a rehab facility.

## 2023-01-16 NOTE — Telephone Encounter (Signed)
Patient's son called and said the patient has diarrhea. He asked if there is anything Dr. Lawerance Bach could recommend or prescribe to help. Best callback is [phone].

## 2023-01-17 DIAGNOSIS — M6281 Muscle weakness (generalized): Secondary | ICD-10-CM | POA: Diagnosis not present

## 2023-01-17 NOTE — TOC Progression Note (Signed)
Transition of Care [facility]) - Progression Note    Patient Details  Name: Paul Mays MRN: [id number] Date of Birth: 1928/03/08  Transition of Care [facility]) CM/SW Contact  Lockie Pares, RN Phone Number: 01/17/2023, 4:59 PM  Clinical Narrative:    Spoke to son regarding SNF update. NO update to report. Fl2 was sent to Dr Freida Busman and patient sent out in the HUB. Discussed the flow of placement, FL2, send out to facilities and await acceptances, then they have a choice of acceptances. Did stress that we would have to go with what is offered, cannot  wait for acceptances of a wish list. He did state that he would like 1. [facility] 2. [facility] 3. [facility]. So wanted to make sure his dad was getting adequate hydration. [facility] and has aides as well. Discussed update with team, paramedic and provider.         Expected Discharge Plan and Services                                               Social Determinants of Health (SDOH) Interventions SDOH Screenings   Food Insecurity: No Food Insecurity (01/05/2023)  Housing: Low Risk  (12/28/2022)  Transportation Needs: No Transportation Needs (01/12/2023)  Utilities: Not At Risk (12/28/2022)  Alcohol Screen: Low Risk  (01/29/2022)  Depression (PHQ2-9): Low Risk  (01/09/2023)  Financial Resource Strain: Low Risk  (01/12/2023)  Physical Activity: Sufficiently Active (01/29/2022)  Social Connections: Socially Integrated (01/29/2022)  Stress: No Stress Concern Present (01/29/2022)  Tobacco Use: Low Risk  (01/16/2023)    Readmission Risk Interventions    01/04/2023    9:49 AM 11/13/2021   11:43 AM  Readmission Risk Prevention Plan  Post Dischage Appt  Complete  Medication Screening  Complete  Transportation Screening Complete Complete  PCP or Specialist Appt within 3-5 Days Complete   HRI or Home Care Consult Complete   Social Work Consult for Recovery Care Planning/Counseling  Complete   Palliative Care Screening Complete   Medication Review Oceanographer) Not Complete   Med Review Comments RNCM will review

## 2023-01-17 NOTE — Evaluation (Signed)
Physical Therapy Evaluation Patient Details Name: Paul Mays MRN: [id number] DOB: 06-Jun-1928 Today's Date: 01/17/2023  History of Present Illness  89 y.o. male  who was recently admitted for gross hematuria and to have a UTI. Patient was supposed to go to rehab facility but apparently went home. Patient did not do well at home. He has been persistently weak and has hard time walking. Patient denies any new falls.   PMhx of afib on eliquis, recent hematuria, CAD, heart failure  Clinical Impression  Pt admitted with above diagnosis.  Pt reluctantly agreeable to mobilize, states he is cold and hungry; pt personal absorbent briefs soaked through to multiple bed pads and sheet as well as blankets soaked on top of pt;  breakfast untouched at bedside. called for assistance from NT, pt able to stand with assist for bed change (by PT, rehab tech and NT).  Pt will benefit from post acute rehab. VS as follows during PT session HR 73-88 SpO2= 98% on RA BP 125/76   Pt currently with functional limitations due to the deficits listed below (see PT Problem List). Pt will benefit from acute skilled PT to increase their independence and safety with mobility to allow discharge.          Recommendations for follow up therapy are one component of a multi-disciplinary discharge planning process, led by the attending physician.  Recommendations may be updated based on patient status, additional functional criteria and insurance authorization.  Follow Up Recommendations Can patient physically be transported by private vehicle: Yes     Assistance Recommended at Discharge Frequent or constant Supervision/Assistance  Patient can return home with the following  Help with stairs or ramp for entrance;Assist for transportation;Assistance with cooking/housework;A little help with walking and/or transfers;A little help with bathing/dressing/bathroom    Equipment Recommendations None recommended by PT   Recommendations for Other Services  OT consult    Functional Status Assessment Patient has had a recent decline in their functional status and demonstrates the ability to make significant improvements in function in a reasonable and predictable amount of time.     Precautions / Restrictions Precautions Precautions: Fall Restrictions Weight Bearing Restrictions: No      Mobility  Bed Mobility Overal bed mobility: Needs Assistance Bed Mobility: Supine to Sit, Sit to Supine     Supine to sit: Max assist Sit to supine: Max assist, +2 for safety/equipment, +2 for physical assistance   General bed mobility comments: assist to elevate trunk and progress LEs off stretcher, +2 assist to safely return to supine with assist for LEs and trunk descent    Transfers Overall transfer level: Needs assistance Equipment used: Rolling walker (2 wheels) Transfers: Sit to/from Stand Sit to Stand: Min assist           General transfer comment: assist to rise and transition to RW    Ambulation/Gait               General Gait Details: lateral steps alogn EOB wiht min assist  Stairs            Wheelchair Mobility    Modified Rankin (Stroke Patients Only)       Balance Overall balance assessment: Needs assistance Sitting-balance support: No upper extremity supported, Feet supported Sitting balance-Leahy Scale: Fair     Standing balance support: During functional activity, Reliant on assistive device for balance Standing balance-Leahy Scale: Poor Standing balance comment: RW to take steps,  could not  static stand without support  Pertinent Vitals/Pain Pain Assessment Pain Assessment: Faces Faces Pain Scale: Hurts little more Pain Location: diffuse Pain Descriptors / Indicators: Aching Pain Intervention(s): Limited activity within patient's tolerance, Monitored during session, Premedicated before session, Repositioned     Home Living Family/patient expects to be discharged to:: Unsure                 Home Equipment: Agricultural consultant (2 wheels);Cane - single point;BSC/3in1;Shower seat      Prior Function Prior Level of Function : Patient poor historian/Family not available             Mobility Comments: amb with RW       Hand Dominance        Extremity/Trunk Assessment   Upper Extremity Assessment Upper Extremity Assessment: Generalized weakness    Lower Extremity Assessment Lower Extremity Assessment: Generalized weakness       Communication   Communication: HOH  Cognition Arousal/Alertness: Awake/alert Behavior During Therapy: WFL for tasks assessed/performed Overall Cognitive Status: Impaired/Different from baseline Area of Impairment: Orientation, Memory, Following commands                     Memory: Decreased short-term memory Following Commands: Follows one step commands with increased time, Follows multi-step commands inconsistently       General Comments: pt states "I don't know" to most questions regarding PLOF, recent events        General Comments      Exercises     Assessment/Plan    PT Assessment Patient needs continued PT services  PT Problem List Decreased strength;Decreased balance;Decreased mobility;Decreased activity tolerance;Decreased cognition;Decreased knowledge of use of DME       PT Treatment Interventions DME instruction;Gait training;Functional mobility training;Therapeutic activities;Balance training;Patient/family education;Therapeutic exercise    PT Goals (Current goals can be found in the Care Plan section)  Acute Rehab PT Goals PT Goal Formulation: Patient unable to participate in goal setting Time For Goal Achievement: 01/31/23 Potential to Achieve Goals: Good    Frequency Min 1X/week     Co-evaluation               AM-PAC PT "6 Clicks" Mobility  Outcome Measure Help needed turning from your back to  your side while in a flat bed without using bedrails?: A Lot Help needed moving from lying on your back to sitting on the side of a flat bed without using bedrails?: Total Help needed moving to and from a bed to a chair (including a wheelchair)?: A Little Help needed standing up from a chair using your arms (e.g., wheelchair or bedside chair)?: A Little Help needed to walk in hospital room?: A Lot Help needed climbing 3-5 steps with a railing? : Total 6 Click Score: 12    End of Session Equipment Utilized During Treatment: Gait belt Activity Tolerance: Patient tolerated treatment well Patient left: with call bell/phone within reach;in bed;with bed alarm set   PT Visit Diagnosis: Muscle weakness (generalized) (M62.81);Difficulty in walking, not elsewhere classified (R26.2)    Time: 1308-6578 PT Time Calculation (min) (ACUTE ONLY): 17 min   Charges:   PT Evaluation $PT Eval Low Complexity: 1 Low          Issiah Huffaker, PT  Acute Rehab Dept [facility] [phone]  01/17/2023   [facility] 01/17/2023, 1:49 PM

## 2023-01-17 NOTE — Progress Notes (Signed)
OT Cancellation Note  Patient Details Name: Paul Mays MRN: [id number] DOB: July 28, 1928   Cancelled Treatment:    Reason Eval/Treat Not Completed: Patient declined, no reason specified No reason stated patient declined to participate. OT to continue to follow and check back as schedule will allow.  Rosalio Loud, MS Acute Rehabilitation Department Office# [phone]  01/17/2023, 3:17 PM

## 2023-01-17 NOTE — NC FL2 (Signed)
[place] MEDICAID FL2 LEVEL OF CARE FORM     IDENTIFICATION  Patient Name: Paul Mays Birthdate: 12-27-1927 Sex: male Admission Date (Current Location): 01/16/2023  [facility] and [place] Number:  Producer, television/film/video and Address:  [facility],  [address] [place], [place] [address]      Provider Number: [id number]  Attending Physician Name and Address:  System, Provider Not In  Relative Name and Phone Number:  Damel Querry [phone]    Current Level of Care: Hospital Recommended Level of Care: Skilled Nursing Facility Prior Approval Number:    Date Approved/Denied:   PASRR Number: [id number]  Discharge Plan: SNF    Current Diagnoses: Patient Active Problem List   Diagnosis Date Noted   Physical deconditioning 01/09/2023   Agitation 01/08/2023   Hypoxia 01/01/2023   Acute cystitis with hematuria 12/31/2022   Paroxysmal atrial fibrillation 12/31/2022   Chronic diastolic CHF (congestive heart failure) 12/31/2022   Gross hematuria 12/27/2022   Multiple skin tears 11/29/2022   Chronic kidney disease, stage 3 11/10/2022   Arthritis 11/10/2022   Headache 11/10/2022   Herpes zoster 11/10/2022   Peripheral edema 11/10/2022   Ulcerative colitis 11/10/2022   CAD (coronary artery disease) 11/10/2022   Neuropathy 11/10/2022   Prostate cancer 11/10/2022   Acquired trigger finger of right little finger 08/19/2022   Acquired trigger finger of right middle finger 08/19/2022   Anemia 07/22/2022   Chronic neck pain 06/10/2022   Trigger finger, right middle finger 04/01/2022   Trigger little finger of right hand 03/27/2022   S/P TAVR (transcatheter aortic valve replacement) 11/12/2021   Dyspnea on exertion 08/05/2021   Acute on chronic diastolic heart failure 08/05/2021   Lower extremity ulceration 03/08/2021   Weight loss 02/19/2021   Lightheaded 02/19/2021   Periumbilical pain 02/19/2021   Fall 08/21/2020   Head trauma, initial encounter 08/21/2020    Bilateral leg edema 01/26/2020   GERD (gastroesophageal reflux disease) 01/26/2020   Chronic pain of both shoulders 12/10/2018   Easy bruising 03/20/2018   Decreased hearing of right ear 09/04/2017   Rotator cuff tear arthropathy of both shoulders 10/18/2015   Thrombocytopenia 09/24/2015   Diverticulosis of colon without hemorrhage 07/02/2014   Hypertrophic cardiomyopathy (HCC) 03/24/2011   OBSTRUCTIVE SLEEP APNEA 11/08/2010   HYPERTENSION, PULMONARY 09/16/2010   HEARING LOSS, BILATERAL 06/18/2009   Coronary atherosclerosis 03/07/2009   Mays aortic stenosis 03/07/2009   CAROTID STENOSIS 03/07/2009   Hyperlipidemia 11/15/2008   Peripheral neuropathy, idiopathic 11/15/2008   Essential hypertension 11/15/2008   COLITIS, HX OF 11/15/2008   VENOUS INSUFFICIENCY 04/20/2007   Gout 02/10/2007    Orientation RESPIRATION BLADDER Height & Weight     Self  Normal Incontinent Weight: 69.9 kg Height:   (162.6 cm)  BEHAVIORAL SYMPTOMS/MOOD NEUROLOGICAL BOWEL NUTRITION STATUS      Incontinent Diet (See discharge Summary)  AMBULATORY STATUS COMMUNICATION OF NEEDS Skin   Extensive Assist Verbally Normal (On discharge summary)                       Personal Care Assistance Level of Assistance  Bathing, Feeding, Dressing Bathing Assistance: Maximum assistance Feeding assistance: Limited assistance Dressing Assistance: Limited assistance     Functional Limitations Info    Sight Info: Adequate Hearing Info: Adequate Speech Info: Adequate    SPECIAL CARE FACTORS FREQUENCY  PT (By licensed PT), OT (By licensed OT)     PT Frequency: 5 x per week OT Frequency: 5 x per  week            Contractures Contractures Info: Not present    Additional Factors Info  Code Status, Allergies   Allergies Info: Spirolactone, Lisinopril           Current Medications (01/17/2023):  This is the current hospital active medication list Current Facility-Administered Medications   Medication Dose Route Frequency Provider Last Rate Last Admin   acetaminophen (TYLENOL) tablet 650 mg  650 mg Oral Q6H PRN Charlynne Pander, MD   650 mg at 01/16/23 2004   apixaban (ELIQUIS) tablet 2.5 mg  2.5 mg Oral BID Charlynne Pander, MD   2.5 mg at 01/17/23 1016   furosemide (LASIX) tablet 40 mg  40 mg Oral Daily Charlynne Pander, MD   40 mg at 01/17/23 1015   hydrALAZINE (APRESOLINE) tablet 25 mg  25 mg Oral BID Charlynne Pander, MD   25 mg at 01/17/23 1016   metoprolol tartrate (LOPRESSOR) tablet 25 mg  25 mg Oral BID Charlynne Pander, MD   25 mg at 01/17/23 1016   oxybutynin (DITROPAN) tablet 10 mg  10 mg Oral Q8H PRN Charlynne Pander, MD       pravastatin (PRAVACHOL) tablet 40 mg  40 mg Oral Daily Charlynne Pander, MD   40 mg at 01/17/23 1015   QUEtiapine (SEROQUEL) tablet 25 mg  25 mg Oral QHS PRN Charlynne Pander, MD   25 mg at 01/16/23 2340   Current Outpatient Medications  Medication Sig Dispense Refill   acetaminophen (TYLENOL) 325 MG tablet Take 2 tablets (650 mg total) by mouth every 6 (six) hours as needed for mild pain (or Fever >/= 101).     allopurinol (ZYLOPRIM) 300 MG tablet TAKE 1 TABLET(300 MG) BY MOUTH DAILY AS NEEDED FOR GOUT FLARE (Patient taking differently: Take 300 mg by mouth daily as needed (gout flare).) 90 tablet 0   amoxicillin (AMOXIL) 500 MG tablet Take 4 tablets (2,000 mg total) by mouth as directed. 1 HOUR PRIOR TO DENTAL APPOINTMENTS 12 tablet 6   apixaban (ELIQUIS) 2.5 MG TABS tablet Take 1 tablet (2.5 mg total) by mouth 2 (two) times daily. 60 tablet 2   Cholecalciferol (VITAMIN D3) 50 MCG (2000 UT) capsule Take 2,000 Units by mouth daily.     furosemide (LASIX) 40 MG tablet Take 1 tablet (40 mg total) by mouth daily.     gabapentin (NEURONTIN) 600 MG tablet Take 1 tablet (600 mg total) by mouth at bedtime. 90 tablet 1   hydrALAZINE (APRESOLINE) 25 MG tablet TAKE 1 TABLET(25 MG) BY MOUTH IN THE MORNING AND AT BEDTIME (Patient taking  differently: Take 25 mg by mouth 2 (two) times daily.) 60 tablet 10   iron polysaccharides (NIFEREX) 150 MG capsule Take 1 capsule (150 mg total) by mouth daily. 30 capsule 5   metoprolol tartrate (LOPRESSOR) 25 MG tablet Take 1 tablet (25 mg total) by mouth 2 (two) times daily. 180 tablet 3   oxybutynin (DITROPAN) 5 MG tablet Take 2 tablets (10 mg total) by mouth every 8 (eight) hours as needed for bladder spasms. 20 tablet 0   potassium chloride (KLOR-CON M) 10 MEQ tablet Take 1 tablet by mouth daily (with Furosemide) 90 tablet 3   pravastatin (PRAVACHOL) 40 MG tablet Take 1 tablet (40 mg total) by mouth daily. 90 tablet 3   QUEtiapine (SEROQUEL) 25 MG tablet Take 1 tablet (25 mg total) by mouth at bedtime as needed (agitation). 20 tablet  0   vitamin B-12 (CYANOCOBALAMIN) 500 MCG tablet Take 500 mcg by mouth daily.       Discharge Medications: Please see discharge summary for a list of discharge medications.  Relevant Imaging Results:  Relevant Lab Results:   Additional Information SSN [id number]  Lockie Pares, RN

## 2023-01-18 DIAGNOSIS — M6281 Muscle weakness (generalized): Secondary | ICD-10-CM | POA: Diagnosis not present

## 2023-01-18 NOTE — Evaluation (Addendum)
Occupational Therapy Evaluation Patient Details Name: Paul Mays MRN: [id number] DOB: 02/16/1928 Today's Date: 01/18/2023   History of Present Illness Paitent is a 89 year old male who was recently admitted for gross hematuria and to have a UTI. Patient was supposed to go to rehab facility but apparently went home. Patient did not do well at home. He has been persistently weak and has hard time walking. Patient denies any new falls.   PMhx of afib on eliquis, recent hematuria, CAD, heart failure   Clinical Impression   Patient is a 89 year old male who was admitted for above. Patient was living at home with wife after recent d/c from hospital. Patients session was limited with onset of dizziness with transitions with Bps noted below. Nurse made aware. Patient was noted to have decreased functional activity tolernace, decreased ROM, decreased BUE strength, decreased endurance, decreased sitting balance, decreased standing balanced, decreased safety awareness, and decreased knowledge of AE/AD impacting participation in ADLs.    Blood pressures Sitting EOB: 115/66 mmhg (MAP 81) Standing 87/57 mmhg (MAP 65) with reports of dizziness.  Supine in bed 128/70 mmhg (MAP 88)     Recommendations for follow up therapy are one component of a multi-disciplinary discharge planning process, led by the attending physician.  Recommendations may be updated based on patient status, additional functional criteria and insurance authorization.   Assistance Recommended at Discharge Frequent or constant Supervision/Assistance  Patient can return home with the following A lot of help with bathing/dressing/bathroom;A lot of help with walking and/or transfers;Direct supervision/assist for financial management;Help with stairs or ramp for entrance;Assist for transportation;Direct supervision/assist for medications management;Assistance with cooking/housework    Functional Status Assessment  Patient has had a recent  decline in their functional status and demonstrates the ability to make significant improvements in function in a reasonable and predictable amount of time.  Equipment Recommendations  None recommended by OT    Recommendations for Other Services       Precautions / Restrictions Precautions Precautions: Fall Precaution Comments: monitor O2, HR and BP Restrictions Weight Bearing Restrictions: No      Mobility Bed Mobility Overal bed mobility: Needs Assistance Bed Mobility: Supine to Sit, Sit to Supine     Supine to sit: Mod assist Sit to supine: Max assist   General bed mobility comments: assist to elevate trunk and progress LEs off stretcher, assist to safely return to supine with assist for LEs and trunk descent       Balance Overall balance assessment: Needs assistance Sitting-balance support: No upper extremity supported, Feet supported Sitting balance-Leahy Scale: Fair     Standing balance support: During functional activity, Reliant on assistive device for balance Standing balance-Leahy Scale: Poor Standing balance comment: strong posterior leaning                           ADL either performed or assessed with clinical judgement   ADL Overall ADL's : Needs assistance/impaired Eating/Feeding: Set up;Sitting Eating/Feeding Details (indicate cue type and reason): educated on importance of sitting up as tall as possible to prevent aspiration. patient verbalized understanding but declined improved positioning at end of session. Grooming: Set up;Wash/dry face;Sitting   Upper Body Bathing: Minimal assistance;Sitting   Lower Body Bathing: Maximal assistance;Sitting/lateral leans;Sit to/from stand Lower Body Bathing Details (indicate cue type and reason): unable to bring foot to lap Upper Body Dressing : Minimal assistance;Sitting   Lower Body Dressing: Total assistance;Sitting/lateral leans;Sit to/from stand  Toilet Transfer Details (indicate cue type  and reason): patient reported increased dizziness sitting EOB and then standing see BP above. noted to have strong posterior leaning on intal standing with BLE needing blocking to be able to stand better. Toileting- Clothing Manipulation and Hygiene: Total assistance;Sit to/from stand;Sitting/lateral lean                Pertinent Vitals/Pain Pain Assessment Pain Assessment: Faces Faces Pain Scale: Hurts little more Pain Location: neck and shoulders Pain Descriptors / Indicators: Discomfort, Guarding Pain Intervention(s): Limited activity within patient's tolerance, Monitored during session     Hand Dominance Left   Extremity/Trunk Assessment Upper Extremity Assessment Upper Extremity Assessment: LUE deficits/detail;RUE deficits/detail RUE Deficits / Details: pain with movemen tablew to FF about 60 degrees with increased time.can touch top of head but not reach behind back RUE: Unable to fully assess due to pain LUE Deficits / Details: pain with movemen tablew to FF about 60 degrees with increased time. can touch top of head but not reach behind back LUE: Unable to fully assess due to pain   Lower Extremity Assessment Lower Extremity Assessment: Defer to PT evaluation   Cervical / Trunk Assessment Cervical / Trunk Assessment: Kyphotic   Communication Communication Communication: HOH   Cognition Arousal/Alertness: Awake/alert Behavior During Therapy: WFL for tasks assessed/performed Overall Cognitive Status: Impaired/Different from baseline Area of Impairment: Orientation, Memory, Following commands                 Orientation Level: Disoriented to, Time, Place, Situation   Memory: Decreased short-term memory Following Commands: Follows one step commands with increased time, Follows multi-step commands inconsistently       General Comments: patient reported he was in a bed when asked orientation questions. patient oriented to self.                Home  Living Family/patient expects to be discharged to:: Skilled nursing facility Living Arrangements: Spouse/significant other Available Help at Discharge: Family;Available 24 hours/day Type of Home: House                       Home Equipment: Agricultural consultant (2 wheels);Cane - single point;BSC/3in1;Shower seat          Prior Functioning/Environment Prior Level of Function : Patient poor historian/Family not available             Mobility Comments: amb with RW ADLs Comments: pt reports normally being able to perform ADLs unassisted        OT Problem List: Impaired balance (sitting and/or standing);Decreased coordination;Decreased safety awareness;Decreased knowledge of precautions;Decreased knowledge of use of DME or AE      OT Treatment/Interventions: Self-care/ADL training;Energy conservation;Therapeutic exercise;DME and/or AE instruction;Therapeutic activities;Patient/family education;Balance training    OT Goals(Current goals can be found in the care plan section) Acute Rehab OT Goals Patient Stated Goal: to go back home OT Goal Formulation: With patient Time For Goal Achievement: 02/01/23 Potential to Achieve Goals: Fair ADL Goals Pt Will Perform Upper Body Dressing: with supervision;sitting Pt Will Transfer to Toilet: with supervision;ambulating;regular height toilet Pt Will Perform Toileting - Clothing Manipulation and hygiene: with supervision;sit to/from stand  OT Frequency: Min 2X/week       AM-PAC OT "6 Clicks" Daily Activity     Outcome Measure Help from another person eating meals?: A Little Help from another person taking care of personal grooming?: A Little Help from another person toileting, which includes using toliet, bedpan, or urinal?: A  Lot Help from another person bathing (including washing, rinsing, drying)?: A Lot Help from another person to put on and taking off regular upper body clothing?: A Little Help from another person to put on and  taking off regular lower body clothing?: A Lot 6 Click Score: 15   End of Session Equipment Utilized During Treatment: Gait belt;Rolling walker (2 wheels) Nurse Communication: Other (comment) (ok to see patient, patients BP during session.)  Activity Tolerance: Patient tolerated treatment well Patient left: in bed;with call bell/phone within reach (in ED)  OT Visit Diagnosis: Unsteadiness on feet (R26.81);Other abnormalities of gait and mobility (R26.89);Muscle weakness (generalized) (M62.81);Pain                Time: 1191-4782 OT Time Calculation (min): 21 min Charges:  OT General Charges $OT Visit: 1 Visit OT Evaluation $OT Eval Moderate Complexity: 1 Mod  Dovey Fatzinger OTR/L, MS Acute Rehabilitation Department Office# [phone]   Selinda Flavin 01/18/2023, 12:13 PM

## 2023-01-18 NOTE — TOC Progression Note (Signed)
Transition of Care [facility]) - Progression Note    Patient Details  Name: Paul Mays MRN: [id number] Date of Birth: 12-23-1927  Transition of Care [facility]) CM/SW Contact  Paul Mays, Paul Mays, [place] Phone Number: 01/18/2023, 10:42 AM  Clinical Narrative:     Bed offers received in HUB from [facility] and [facility].  Bed offers declined in HUB from [place] and [place].  CSW placed call to Paul Mays, Admissions Coordinator at [place] [phone]), to check status of bed offers, but without success.  HIPAA compliant message left on voicemail, as CSW awaits a return call.  CSW also attempted to contact admissions coordinators at [facility] (# [phone]) and [address] at [place] [phone]), but was only able to leave HIPAA compliant messages on voicemail, as neither individuals are available to accept patient's for placement on weekends.  CSW collaboration with son, Paul Mays, Paul Mays. to provide list of bed offers received.  Son is requesting follow-up on 01/19/2023, with hopes that more bed offers will become available.  CSW will continue to follow.  Expected Discharge Plan and Services   SNF  Social Determinants of Health (SDOH) Interventions SDOH Screenings   Food Insecurity: No Food Insecurity (01/05/2023)  Housing: Low Risk  (12/28/2022)  Transportation Needs: No Transportation Needs (01/12/2023)  Utilities: Not At Risk (12/28/2022)  Alcohol Screen: Low Risk  (01/29/2022)  Depression (PHQ2-9): Low Risk  (01/09/2023)  Financial Resource Strain: Low Risk  (01/12/2023)  Physical Activity: Sufficiently Active (01/29/2022)  Social Connections: Socially Integrated (01/29/2022)  Stress: No Stress Concern Present (01/29/2022)  Tobacco Use: Low Risk  (01/16/2023)    Readmission Risk Interventions    01/04/2023    9:49 AM 11/13/2021   11:43 AM  Readmission Risk Prevention Plan  Post Dischage Appt  Complete  Medication Screening  Complete  Transportation Screening  Complete Complete  PCP or Specialist Appt within 3-5 Days Complete   HRI or Home Care Consult Complete   Social Work Consult for Recovery Care Planning/Counseling Complete   Palliative Care Screening Complete   Medication Review Oceanographer) Not Complete   Med Review Comments RNCM will review    Paul Mays, Paul Mays, Paul Mays, Paul Mays  Licensed Clinical Social Worker  [employer]  Mailing [place] [address], [place], [place] [address] Physical Address[address] [place], [place] [address] Toll Free Main # [phone] Fax # [phone] Cell # [phone] [email or web address]

## 2023-01-18 NOTE — TOC Progression Note (Signed)
Transition of Care [facility]) - Progression Note    Patient Details  Name: Paul Mays MRN: [id number] Date of Birth: 1927/12/13  Transition of Care [facility]) CM/SW Contact  Anwar Crill, Vinnie [place], [place] Phone Number: 01/18/2023, 8:13 AM  Clinical Narrative:     CSW collaboration with [facility] RN Case Manager, Gilda Crease to discuss patient's sons desire for patient to be placed at either [place], [place], or [facility].  Judeth Cornfield completed FL-2 Form and faxed out in [place] on 01/17/2023.  As of 01/18/2023 at 8:15 am, only bed offers received thus far include:  [facility] and [facility].  CSW will contact admissions department at [place], [place], and [facility] to check bed availability then follow-up with son to report findings.  Expected Discharge Plan and Services   SNF   Social Determinants of Health (SDOH) Interventions SDOH Screenings   Food Insecurity: No Food Insecurity (01/05/2023)  Housing: Low Risk  (12/28/2022)  Transportation Needs: No Transportation Needs (01/12/2023)  Utilities: Not At Risk (12/28/2022)  Alcohol Screen: Low Risk  (01/29/2022)  Depression (PHQ2-9): Low Risk  (01/09/2023)  Financial Resource Strain: Low Risk  (01/12/2023)  Physical Activity: Sufficiently Active (01/29/2022)  Social Connections: Socially Integrated (01/29/2022)  Stress: No Stress Concern Present (01/29/2022)  Tobacco Use: Low Risk  (01/16/2023)    Readmission Risk Interventions    01/04/2023    9:49 AM 11/13/2021   11:43 AM  Readmission Risk Prevention Plan  Post Dischage Appt  Complete  Medication Screening  Complete  Transportation Screening Complete Complete  PCP or Specialist Appt within 3-5 Days Complete   HRI or Home Care Consult Complete   Social Work Consult for Recovery Care Planning/Counseling Complete   Palliative Care Screening Complete   Medication Review Oceanographer) Not Complete   Med Review  Comments RNCM will review    Danford Bad, BSW, MSW, [employer]  Licensed Clinical Social Worker  [employer]  Mailing [place] [address] [place], [place] [address] Physical Address[address], [place], [place] [address] Toll Free Main # [phone] Fax # [phone] Cell # [phone] [email or web address]

## 2023-01-18 NOTE — ED Notes (Signed)
Checked on pt, and asked if they were comfortable and needed a new brief. Pt stated they were fine and their brief was dry

## 2023-01-18 NOTE — ED Notes (Signed)
Bedding was changed. New brief was placed on him and he was given breakfast

## 2023-01-18 NOTE — ED Notes (Signed)
Pt's brief wet with urine. Peri care provided. New brief and linens placed. Pt states no further needs.

## 2023-01-19 ENCOUNTER — Ambulatory Visit: Payer: Self-pay | Admitting: Licensed Clinical Social Worker

## 2023-01-19 DIAGNOSIS — M6281 Muscle weakness (generalized): Secondary | ICD-10-CM | POA: Diagnosis not present

## 2023-01-19 NOTE — Progress Notes (Signed)
1432 Palliative Care Initial Encounter Note   PATIENT NAME: Paul Mays DOB: Feb 23, 1928 MRN: [id number]  PRIMARY CARE PROVIDER: Pincus Sanes, MD  RESPONSIBLE PARTY:  Acct ID - Guarantor Home Phone Work Phone Relationship Acct Type  [id number] - Fesler,WILLIA* [phone]  Self P/F     [address], [place], [place] [address]   RN completed home visit. Sons, [place] and Aurther Loft also present .   HISTORY OF PRESENT ILLNESS:  89 year old male with past medical history significant for pulmonary hypertension, atrial fibrillation on Eliquis, HTN, HLD, migraine, CAD, gout, CKD stage III, transcatheter aortic valve replacement, and a remote h/o history of prostate cancer. CT abdomen pelvis revealed: 5 cm lobulated density within the bladder. 24 hour paid caregivers in use at this time. Before hospitalization, there was a paid caregiver during the day for patient's spouse.   [facility] currently assigned to pt from this past hospitalization.    Socially: Currently lives in single story apartment with wife of 70 years. 2 sons, both present during this visit. [facility] is ineffective.   Cognitive: Alert and oriented x 4. Participates in conversation and answers questions appropriately.   Appetite: Eats 2 meals per day. Does eat snacks. Eats 100% of meals per day   Mobility: uses walker at all times now. RN walked with pt. Walks very slowly and shuffles feet. Sons report that prior to this past hospitalization, pt was fully independent and self sufficient.   Sleeping Pattern: Denies any issues with sleeping. Sleeps 10 hrs per night typically. Denies taking naps routinely. Sons report that he has been sleeping a lot   Pain: Pt has arthritis pain in upper chest and both shoulders. Rates pain as 3 on 1/10 scale.  Gi/GU: Dx with UTI in hospital as well as blood clot in bladder. Indwelling catheter currently in place. Urine in bag is clear and pale yellow. RN talked with pt and sons  regarding increased risk of infection, s/s of UTI, and prevention of infection.  Palliative Care/ Hospice: RN explained role and purpose of palliative care including visit frequency. Also discussed benefits of hospice care as well as the differences between the two with patient.   Goals of Care: Get home health on a routine schedule.     CODE STATUS: DNR (found in Epic) ADVANCED DIRECTIVES: Yes  (found in Epic) MOST FORM: No PPS:   Next appt scheduled: Check SW note 18th pm and call 19th am to schedule next visit.     PHYSICAL EXAM:   VITALS: Today's Vitals   01/13/23 1450  BP: 130/80  Pulse: 85  Resp: 18  SpO2: 92%  PainSc: 3   PainLoc: Shoulder    LUNGS: lungs diminished in bases bilat. CARDIAC:  Regular. No JVD EXTREMITIES: MAE x4. Right lower ext appears weaker when standing and walking.       Barbette Merino, RN

## 2023-01-19 NOTE — ED Notes (Signed)
Pt sat up to eat dinner.

## 2023-01-19 NOTE — ED Notes (Signed)
Patient resting comfortably, dry, refusing meds

## 2023-01-19 NOTE — ED Notes (Signed)
Pt sitting in chair.

## 2023-01-19 NOTE — ED Notes (Signed)
Pt brought back to bed

## 2023-01-19 NOTE — Progress Notes (Addendum)
Patient has medicare A/B for insurance which requires a 3 night inpatient stay on the medical floor to be considered for SNF placement. Patient is a [facility] member which could qualify patient for SNF placement from the emergency room.CSW sent an email to [facility] to see if patient qualifies for the SNF wavier. The SNF wavier  is only approved by the following facilities. CSW left a VM for patient son to discuss insurance barriers and the facilities that will take the wavier Patient has also had a recent admission March 23rd. CSW will verify from the facilities if they are able to accept that admission.     [facility]  [facility] [facility] [facility]- DENIED, FACILITY FULL  [facility]   [place] [facility]   [place]    CSW received an email back from [facility] and patient is eligible for the [facility] wavier.

## 2023-01-19 NOTE — Patient Outreach (Signed)
  Care Coordination  Follow Up Visit Note   01/19/2023 Name: Paul Mays MRN: [id number] DOB: [birth year]  Paul Mays is a 89 y.o. year old male who sees Burns, Bobette Mo, MD for primary care. I spoke with  Paul Mays 's son Paul Mays by phone today.  What matters to the patients health and wellness today?  Getting placed at facility for Rehab.  Son is concerned that patient is not able to go to [facility] for rehab. Due to no beds available.  Son is working with Child psychotherapist in the ED.   Paul Mays will Advertising account planner at [facility]  later this week to see if a bed is available if he is not pleased with the facility where patient will be discharged.    Goals Addressed             This Visit's Progress    COMPLETED: Rehab at facility         SDOH assessments and interventions completed:  No   Care Coordination Interventions:  Yes, provided  Interventions Today    Flowsheet Row Most Recent Value  Chronic Disease   Chronic disease during today's visit Hypertension (HTN)  General Interventions   General Interventions Discussed/Reviewed Level of Care, Communication with  Communication with --  Engineer, petroleum at [employer]  Level of Care Skilled Nursing Facility  [will be placed from hospital for short term rehab]  Mental Health Interventions   Mental Health Discussed/Reviewed --  [caregiver stress ( solution focused, task centered, active listening )]       Follow up plan: No further intervention required. Son states he will contact LCSW if needed.    Encounter Outcome:  Pt. Visit Completed   Sammuel Hines, LCSW Social Work Care Coordination  [facility] [employer] [phone]

## 2023-01-19 NOTE — Progress Notes (Signed)
Mobility Specialist - Progress Note   01/19/23 1429  Mobility  Activity Transferred from bed to chair  Level of Assistance Contact guard assist, steadying assist  Assistive Device Front wheel walker  Distance Ambulated (ft) 2 ft  Activity Response Tolerated well  Mobility Referral Yes  $Mobility charge 1 Mobility   Pt received in bed and agreed to transfer. Assisted self cleaning with OT. Pt returned to chair with all needs met.   Marilynne Halsted Mobility Specialist

## 2023-01-19 NOTE — ED Notes (Signed)
Patient repositioned in bed, brief changed. Call bell in reach. nad

## 2023-01-19 NOTE — Progress Notes (Signed)
Occupational Therapy Treatment Patient Details Name: Paul Mays MRN: [id number] DOB: 06-Jan-1928 Today's Date: 01/19/2023   History of present illness Paitent is a 89 year old male who was recently admitted for gross hematuria and to have a UTI. Patient was supposed to go to rehab facility but apparently went home. Patient did not do well at home. He has been persistently weak and has hard time walking. Patient denies any new falls.   PMhx of afib on eliquis, recent hematuria, CAD, heart failure   OT comments  Patient was noted to have improved ability to participate in transfers compared to evaluation. Patient did report dizziness upon sitting in recliner but BP remained stable during session. Patient noted to have large incontinence issues with bowel and bladder effecting session. Patient would continue to benefit from skilled OT services at this time while admitted and after d/c to address noted deficits in order to improve overall safety and independence in ADLs.    Blood pressures Supine:123/74 mmhg HR95 bpm  Sitting: 104/62 mmhg HR 82 bpm  Sitting in recliner after transfer and hygiene tasks: 118/64 mmhg HR 84 bpm Patient maintained 90s on O2 during session on RA.    Recommendations for follow up therapy are one component of a multi-disciplinary discharge planning process, led by the attending physician.  Recommendations may be updated based on patient status, additional functional criteria and insurance authorization.    Assistance Recommended at Discharge Frequent or constant Supervision/Assistance  Patient can return home with the following  A lot of help with bathing/dressing/bathroom;A lot of help with walking and/or transfers;Direct supervision/assist for financial management;Help with stairs or ramp for entrance;Assist for transportation;Direct supervision/assist for medications management;Assistance with cooking/housework   Equipment Recommendations  None recommended by OT        Precautions / Restrictions Precautions Precautions: Fall Precaution Comments: monitor O2, HR and BP Restrictions Weight Bearing Restrictions: No       Mobility Bed Mobility Overal bed mobility: Needs Assistance Bed Mobility: Supine to Sit     Supine to sit: Mod assist     General bed mobility comments: assist to elevate trunk and progress LEs off stretcher,          ADL either performed or assessed with clinical judgement   ADL Overall ADL's : Needs assistance/impaired                     Lower Body Dressing: Total assistance;Sitting/lateral leans;Sit to/from stand Lower Body Dressing Details (indicate cue type and reason): unable to doff/don new socks with increased time Toilet Transfer: Minimal assistance;Ambulation;Rolling walker (2 wheels) Toilet Transfer Details (indicate cue type and reason): patient was min A +2 for safety to transfer from edge of bed to recliner in room with patient noted to have large incontinence epsiode with standing with TD to get clean. Toileting- Clothing Manipulation and Hygiene: Total assistance;Sit to/from stand Toileting - Clothing Manipulation Details (indicate cue type and reason): patient was found to have large stool in adult absorbent undergarment when standign to transfer with TD to complete hygiene tasks.              Cognition Arousal/Alertness: Awake/alert Behavior During Therapy: WFL for tasks assessed/performed Overall Cognitive Status: Impaired/Different from baseline                         Following Commands: Follows one step commands with increased time, Follows multi-step commands inconsistently       General  Comments: patient is HOH but agreeable to participate in session. patient asked about where kids and wife were multiple times during session. nurse made aware.                   Pertinent Vitals/ Pain       Pain Assessment Pain Assessment: Faces Faces Pain Scale: Hurts little  more Pain Location: neck and shoulders Pain Descriptors / Indicators: Discomfort, Guarding Pain Intervention(s): Limited activity within patient's tolerance, Monitored during session, Repositioned         Frequency  Min 2X/week        Progress Toward Goals  OT Goals(current goals can now be found in the care plan section)  Progress towards OT goals: Progressing toward goals     Plan Discharge plan remains appropriate       AM-PAC OT "6 Clicks" Daily Activity     Outcome Measure   Help from another person eating meals?: A Little Help from another person taking care of personal grooming?: A Little Help from another person toileting, which includes using toliet, bedpan, or urinal?: A Lot Help from another person bathing (including washing, rinsing, drying)?: A Lot Help from another person to put on and taking off regular upper body clothing?: A Little Help from another person to put on and taking off regular lower body clothing?: A Lot 6 Click Score: 15    End of Session Equipment Utilized During Treatment: Gait belt;Rolling walker (2 wheels)  OT Visit Diagnosis: Unsteadiness on feet (R26.81);Other abnormalities of gait and mobility (R26.89);Muscle weakness (generalized) (M62.81);Pain   Activity Tolerance Patient tolerated treatment well   Patient Left in chair;with call bell/phone within reach;with chair alarm set;Other (comment) (in ED with nurse aware on need to change posey box batteries)   Nurse Communication Other (comment) (patients need for new batteries in posey box)        Time: 9532-0233 OT Time Calculation (min): 25 min  Charges: OT General Charges $OT Visit: 1 Visit OT Treatments $Self Care/Home Management : 23-37 mins  ,Rosalio Loud, MS Acute Rehabilitation Department Office# [phone]   Selinda Flavin 01/19/2023, 2:52 PM

## 2023-01-19 NOTE — Progress Notes (Signed)
CSW spoke with [facility] in admissions at [facility] who accepts patient. Patient can discharge tomorrow morning. Family agrees with discharge plan.

## 2023-01-19 NOTE — Progress Notes (Signed)
CSW spoke with patients son who asked CSW to reach out to [facility]. CSW left an voicemail for admissions at [facility]. [place] with admissions at [facility] will review patient.

## 2023-01-19 NOTE — Patient Instructions (Signed)
Visit Information  Thank you for taking time to visit with me today. Please don't hesitate to contact me if I can be of assistance to you.   Following are the goals we discussed today:   Goals Addressed             This Visit's Progress    COMPLETED: Rehab at facility          Please call the care guide team at [phone] if you need to cancel or reschedule your appointment.    The patient verbalized understanding of instructions, educational materials, and care plan provided today and DECLINED offer to receive copy of patient instructions, educational materials, and care plan.   No further follow up required: Family declines support at time.  Will contact LCSW as needed  Sammuel Hines, LCSW Social Work Care Coordination  [facility] [employer] [phone]

## 2023-01-19 NOTE — ED Provider Notes (Signed)
Emergency Medicine Observation Re-evaluation Note  Paul Mays is a 89 y.o. male, seen on rounds today.  Pt initially presented to the ED for complaints of Weakness Currently, the patient is resting comfortably.  Physical Exam  BP (!) 137/98 (BP Location: Left Arm)   Pulse 88   Temp 98.3 F (36.8 C) (Oral)   Resp 12   Ht 5\' 4"  (1.626 m)   Wt 69.9 kg   SpO2 100%   BMI 26.43 kg/m  Physical Exam General: NAD   ED Course / MDM  EKG:EKG Interpretation  Date/Time:  Friday January 16 2023 15:52:11 EDT Ventricular Rate:  88 PR Interval:    QRS Duration: 104 QT Interval:  372 QTC Calculation: 451 R Axis:   -39 Text Interpretation: Atrial fibrillation Left axis deviation Abnormal R-wave progression, late transition Nonspecific repol abnormality, diffuse leads No significant change since last tracing Confirmed by Richardean Canal [id number]) on 01/16/2023 4:05:08 PM Also confirmed by Richardean Canal [phone]), editor Erenest Rasher ([id number])  on 01/16/2023 4:05:57 PM  I have reviewed the labs performed to date as well as medications administered while in observation.  Recent changes in the last 24 hours include no acute events noted.  Plan  Current plan is for rehab placement.    Wynetta Fines, MD 01/19/23 [phone]

## 2023-01-19 NOTE — ED Notes (Signed)
Patient resting comfotably at this time no apparent distress, patient is dry.

## 2023-01-19 NOTE — ED Notes (Addendum)
PT in room 

## 2023-01-20 DIAGNOSIS — C61 Malignant neoplasm of prostate: Secondary | ICD-10-CM | POA: Diagnosis not present

## 2023-01-20 DIAGNOSIS — I5020 Unspecified systolic (congestive) heart failure: Secondary | ICD-10-CM | POA: Diagnosis not present

## 2023-01-20 DIAGNOSIS — W19XXXA Unspecified fall, initial encounter: Secondary | ICD-10-CM | POA: Diagnosis not present

## 2023-01-20 DIAGNOSIS — E785 Hyperlipidemia, unspecified: Secondary | ICD-10-CM | POA: Diagnosis not present

## 2023-01-20 DIAGNOSIS — S0101XA Laceration without foreign body of scalp, initial encounter: Secondary | ICD-10-CM | POA: Diagnosis not present

## 2023-01-20 DIAGNOSIS — S0990XA Unspecified injury of head, initial encounter: Secondary | ICD-10-CM | POA: Diagnosis not present

## 2023-01-20 DIAGNOSIS — D631 Anemia in chronic kidney disease: Secondary | ICD-10-CM | POA: Diagnosis not present

## 2023-01-20 DIAGNOSIS — M1000 Idiopathic gout, unspecified site: Secondary | ICD-10-CM | POA: Diagnosis not present

## 2023-01-20 DIAGNOSIS — R2689 Other abnormalities of gait and mobility: Secondary | ICD-10-CM | POA: Diagnosis not present

## 2023-01-20 DIAGNOSIS — R54 Age-related physical debility: Secondary | ICD-10-CM | POA: Diagnosis not present

## 2023-01-20 DIAGNOSIS — Y92129 Unspecified place in nursing home as the place of occurrence of the external cause: Secondary | ICD-10-CM | POA: Diagnosis not present

## 2023-01-20 DIAGNOSIS — Z719 Counseling, unspecified: Secondary | ICD-10-CM | POA: Diagnosis not present

## 2023-01-20 DIAGNOSIS — S51812A Laceration without foreign body of left forearm, initial encounter: Secondary | ICD-10-CM | POA: Diagnosis not present

## 2023-01-20 DIAGNOSIS — N3001 Acute cystitis with hematuria: Secondary | ICD-10-CM | POA: Diagnosis not present

## 2023-01-20 DIAGNOSIS — Z7401 Bed confinement status: Secondary | ICD-10-CM | POA: Diagnosis not present

## 2023-01-20 DIAGNOSIS — I482 Chronic atrial fibrillation, unspecified: Secondary | ICD-10-CM | POA: Diagnosis not present

## 2023-01-20 DIAGNOSIS — I5032 Chronic diastolic (congestive) heart failure: Secondary | ICD-10-CM | POA: Diagnosis not present

## 2023-01-20 DIAGNOSIS — N183 Chronic kidney disease, stage 3 unspecified: Secondary | ICD-10-CM | POA: Diagnosis not present

## 2023-01-20 DIAGNOSIS — Z7901 Long term (current) use of anticoagulants: Secondary | ICD-10-CM | POA: Diagnosis not present

## 2023-01-20 DIAGNOSIS — M6281 Muscle weakness (generalized): Secondary | ICD-10-CM | POA: Diagnosis not present

## 2023-01-20 DIAGNOSIS — I272 Pulmonary hypertension, unspecified: Secondary | ICD-10-CM | POA: Diagnosis not present

## 2023-01-20 DIAGNOSIS — R609 Edema, unspecified: Secondary | ICD-10-CM | POA: Diagnosis not present

## 2023-01-20 DIAGNOSIS — Z87440 Personal history of urinary (tract) infections: Secondary | ICD-10-CM | POA: Diagnosis not present

## 2023-01-20 DIAGNOSIS — B952 Enterococcus as the cause of diseases classified elsewhere: Secondary | ICD-10-CM | POA: Diagnosis not present

## 2023-01-20 DIAGNOSIS — G43909 Migraine, unspecified, not intractable, without status migrainosus: Secondary | ICD-10-CM | POA: Diagnosis not present

## 2023-01-20 DIAGNOSIS — I130 Hypertensive heart and chronic kidney disease with heart failure and stage 1 through stage 4 chronic kidney disease, or unspecified chronic kidney disease: Secondary | ICD-10-CM | POA: Diagnosis not present

## 2023-01-20 DIAGNOSIS — N179 Acute kidney failure, unspecified: Secondary | ICD-10-CM | POA: Diagnosis not present

## 2023-01-20 DIAGNOSIS — R2681 Unsteadiness on feet: Secondary | ICD-10-CM | POA: Diagnosis not present

## 2023-01-20 DIAGNOSIS — M6259 Muscle wasting and atrophy, not elsewhere classified, multiple sites: Secondary | ICD-10-CM | POA: Diagnosis not present

## 2023-01-20 DIAGNOSIS — R41841 Cognitive communication deficit: Secondary | ICD-10-CM | POA: Diagnosis not present

## 2023-01-20 DIAGNOSIS — R1312 Dysphagia, oropharyngeal phase: Secondary | ICD-10-CM | POA: Diagnosis not present

## 2023-01-20 DIAGNOSIS — I480 Paroxysmal atrial fibrillation: Secondary | ICD-10-CM | POA: Diagnosis not present

## 2023-01-20 DIAGNOSIS — N390 Urinary tract infection, site not specified: Secondary | ICD-10-CM | POA: Diagnosis not present

## 2023-01-20 DIAGNOSIS — R531 Weakness: Secondary | ICD-10-CM | POA: Diagnosis not present

## 2023-01-20 LAB — CBC
HCT: 39 % (ref 39.0–52.0)
Hemoglobin: 12.3 g/dL — ABNORMAL LOW (ref 13.0–17.0)
MCH: 27.5 pg (ref 26.0–34.0)
MCHC: 31.5 g/dL (ref 30.0–36.0)
MCV: 87.1 fL (ref 80.0–100.0)
Platelets: 255 10*3/uL (ref 150–400)
RBC: 4.48 MIL/uL (ref 4.22–5.81)
RDW: 14.7 % (ref 11.5–15.5)
WBC: 6.6 10*3/uL (ref 4.0–10.5)
nRBC: 0 % (ref 0.0–0.2)

## 2023-01-20 LAB — CREATININE, SERUM
Creatinine, Ser: 1.15 mg/dL (ref 0.61–1.24)
GFR, Estimated: 59 mL/min — ABNORMAL LOW (ref >60)

## 2023-01-20 NOTE — ED Notes (Signed)
Spoke to Carey at [facility] to give report on pt. Per [employer] is supposed to reach out and they will let her know when room is ready for pt.

## 2023-01-20 NOTE — ED Notes (Signed)
Pt refused to take medications stated "Cut the lights off and let me sleep. Come back later." I explained I had his morning medications and he stated "I don't want to take that right now"

## 2023-01-20 NOTE — Discharge Instructions (Signed)
Follow up with your doctor in the office.  

## 2023-01-20 NOTE — ED Notes (Signed)
Pt provided perineal care, new brief applied, repositioned to comfort. 

## 2023-01-20 NOTE — ED Notes (Addendum)
[facility] does not open until 7:30, unable to provide report at this time. Will notify day-shift.

## 2023-01-20 NOTE — ED Notes (Signed)
[facility] called  

## 2023-01-20 NOTE — ED Provider Notes (Signed)
Emergency Medicine Observation Re-evaluation Note  Paul Mays is a 89 y.o. male, seen on rounds today.  Pt initially presented to the ED for complaints of Weakness Currently, the patient is resting comfortably.  Physical Exam  BP 116/79   Pulse 100   Temp 97.7 F (36.5 C) (Oral)   Resp 16   Ht 5\' 4"  (1.626 m)   Wt 69.9 kg   SpO2 99%   BMI 26.43 kg/m  Physical Exam General: NAD   ED Course / MDM  EKG:EKG Interpretation  Date/Time:  Friday January 16 2023 15:52:11 EDT Ventricular Rate:  88 PR Interval:    QRS Duration: 104 QT Interval:  372 QTC Calculation: 451 R Axis:   -39 Text Interpretation: Atrial fibrillation Left axis deviation Abnormal R-wave progression, late transition Nonspecific repol abnormality, diffuse leads No significant change since last tracing Confirmed by Richardean Canal [phone]) on 01/16/2023 4:05:08 PM Also confirmed by Richardean Canal [phone]), editor Erenest Rasher ([id number])  on 01/16/2023 4:05:57 PM  I have reviewed the labs performed to date as well as medications administered while in observation.  Recent changes in the last 24 hours include no acute events noted.  Plan  Current plan is for rehab placement.      Melene Plan, DO 01/20/23 [phone]

## 2023-01-22 ENCOUNTER — Encounter: Payer: Medicare Other | Admitting: Licensed Clinical Social Worker

## 2023-01-23 ENCOUNTER — Other Ambulatory Visit: Payer: Self-pay | Admitting: *Deleted

## 2023-01-23 ENCOUNTER — Telehealth: Payer: Self-pay

## 2023-01-23 DIAGNOSIS — I5032 Chronic diastolic (congestive) heart failure: Secondary | ICD-10-CM | POA: Diagnosis not present

## 2023-01-23 DIAGNOSIS — I480 Paroxysmal atrial fibrillation: Secondary | ICD-10-CM | POA: Diagnosis not present

## 2023-01-23 DIAGNOSIS — R41841 Cognitive communication deficit: Secondary | ICD-10-CM | POA: Diagnosis not present

## 2023-01-23 DIAGNOSIS — M6259 Muscle wasting and atrophy, not elsewhere classified, multiple sites: Secondary | ICD-10-CM | POA: Diagnosis not present

## 2023-01-23 DIAGNOSIS — M6281 Muscle weakness (generalized): Secondary | ICD-10-CM | POA: Diagnosis not present

## 2023-01-23 NOTE — Telephone Encounter (Signed)
1249 Palliative Care Note  Per agreement, RN called son today to see how he was progressing at home with rehabilitation. Son, Genevie Cheshire reports that after [facility] initial visit, pt continued to decline at home and get weaker so they took him to the emergency room and pt was admitted to [facility] for rehab.   RN told son that PC would be more than happy to follow pt while in LTC and son requested that we just wait until pt was discharged back home again. RN agreed and requested that son call when pt gets back home. Son agreed.  Barbette Merino, RN

## 2023-01-23 NOTE — Patient Outreach (Addendum)
Late entry for 01/22/23  [name] recently admitted to [facility] under [facility] 3-day SNF ACO [place] wavier. Screening for potential [facility] care coordination services as benefit of health plan and PCP.  Facility site visit to [place]. Met with Paul Mays social worker to make aware writer is following for transition plans/needs.   Went to bedside to speak with [name]. However, he was off the unit and family was not present. Left [facility] Care Management brochure and writer's contact information at bedside.   Will plan outreach to [name]'s son Paul Mays ([place]).   Will message [facility] care coordination team to make aware writer is following in SNF.   Paul Noble, MSN, RN,BSN [facility] Post Acute Care Coordinator [phone] (Direct dial)

## 2023-01-24 DIAGNOSIS — R54 Age-related physical debility: Secondary | ICD-10-CM | POA: Diagnosis not present

## 2023-01-24 DIAGNOSIS — I5020 Unspecified systolic (congestive) heart failure: Secondary | ICD-10-CM | POA: Diagnosis not present

## 2023-01-24 DIAGNOSIS — I482 Chronic atrial fibrillation, unspecified: Secondary | ICD-10-CM | POA: Diagnosis not present

## 2023-01-24 DIAGNOSIS — M1000 Idiopathic gout, unspecified site: Secondary | ICD-10-CM | POA: Diagnosis not present

## 2023-01-25 ENCOUNTER — Emergency Department (HOSPITAL_COMMUNITY)
Admission: EM | Admit: 2023-01-25 | Discharge: 2023-01-25 | Disposition: A | Discharge status: Home / Self Care | Payer: Medicare Other | Attending: Emergency Medicine | Admitting: Emergency Medicine

## 2023-01-25 ENCOUNTER — Emergency Department (HOSPITAL_COMMUNITY): Payer: Medicare Other

## 2023-01-25 ENCOUNTER — Other Ambulatory Visit: Payer: Self-pay

## 2023-01-25 DIAGNOSIS — Z7901 Long term (current) use of anticoagulants: Secondary | ICD-10-CM | POA: Insufficient documentation

## 2023-01-25 DIAGNOSIS — N179 Acute kidney failure, unspecified: Secondary | ICD-10-CM | POA: Diagnosis not present

## 2023-01-25 DIAGNOSIS — S0101XA Laceration without foreign body of scalp, initial encounter: Secondary | ICD-10-CM

## 2023-01-25 DIAGNOSIS — S51812A Laceration without foreign body of left forearm, initial encounter: Secondary | ICD-10-CM

## 2023-01-25 DIAGNOSIS — Y92129 Unspecified place in nursing home as the place of occurrence of the external cause: Secondary | ICD-10-CM | POA: Insufficient documentation

## 2023-01-25 DIAGNOSIS — W19XXXA Unspecified fall, initial encounter: Secondary | ICD-10-CM | POA: Insufficient documentation

## 2023-01-25 DIAGNOSIS — S0990XA Unspecified injury of head, initial encounter: Secondary | ICD-10-CM | POA: Diagnosis not present

## 2023-01-25 LAB — COMPREHENSIVE METABOLIC PANEL
ALT: 29 U/L (ref 0–44)
AST: 41 U/L (ref 15–41)
Albumin: 3.3 g/dL — ABNORMAL LOW (ref 3.5–5.0)
Alkaline Phosphatase: 50 U/L (ref 38–126)
Anion gap: 9 (ref 5–15)
BUN: 44 mg/dL — ABNORMAL HIGH (ref 8–23)
CO2: 29 mmol/L (ref 22–32)
Calcium: 9.2 mg/dL (ref 8.9–10.3)
Chloride: 94 mmol/L — ABNORMAL LOW (ref 98–111)
Creatinine, Ser: 1.75 mg/dL — ABNORMAL HIGH (ref 0.61–1.24)
GFR, Estimated: 36 mL/min — ABNORMAL LOW (ref >60)
Glucose, Bld: 138 mg/dL — ABNORMAL HIGH (ref 70–99)
Potassium: 4.7 mmol/L (ref 3.5–5.1)
Sodium: 132 mmol/L — ABNORMAL LOW (ref 135–145)
Total Bilirubin: 1.2 mg/dL (ref 0.3–1.2)
Total Protein: 6.3 g/dL — ABNORMAL LOW (ref 6.5–8.1)

## 2023-01-25 LAB — CBC WITH DIFFERENTIAL/PLATELET
Abs Immature Granulocytes: 0.03 10*3/uL (ref 0.00–0.07)
Basophils Absolute: 0.1 10*3/uL (ref 0.0–0.1)
Basophils Relative: 1 %
Eosinophils Absolute: 0.3 10*3/uL (ref 0.0–0.5)
Eosinophils Relative: 3 %
HCT: 35.1 % — ABNORMAL LOW (ref 39.0–52.0)
Hemoglobin: 11 g/dL — ABNORMAL LOW (ref 13.0–17.0)
Immature Granulocytes: 0 %
Lymphocytes Relative: 7 %
Lymphs Abs: 0.7 10*3/uL (ref 0.7–4.0)
MCH: 27.5 pg (ref 26.0–34.0)
MCHC: 31.3 g/dL (ref 30.0–36.0)
MCV: 87.8 fL (ref 80.0–100.0)
Monocytes Absolute: 1.1 10*3/uL — ABNORMAL HIGH (ref 0.1–1.0)
Monocytes Relative: 12 %
Neutro Abs: 6.9 10*3/uL (ref 1.7–7.7)
Neutrophils Relative %: 77 %
Platelets: 189 10*3/uL (ref 150–400)
RBC: 4 MIL/uL — ABNORMAL LOW (ref 4.22–5.81)
RDW: 14.7 % (ref 11.5–15.5)
WBC: 9 10*3/uL (ref 4.0–10.5)
nRBC: 0 % (ref 0.0–0.2)

## 2023-01-25 LAB — PROTIME-INR
INR: 1.3 — ABNORMAL HIGH (ref 0.8–1.2)
Prothrombin Time: 16.4 seconds — ABNORMAL HIGH (ref 11.4–15.2)

## 2023-01-25 LAB — TYPE AND SCREEN
ABO/RH(D): A NEG
Antibody Screen: NEGATIVE

## 2023-01-25 MED ORDER — LACTATED RINGERS IV BOLUS
1000.0000 mL | Freq: Once | INTRAVENOUS | Status: AC
  Administered 2023-01-25: 1000 mL via INTRAVENOUS

## 2023-01-25 NOTE — ED Triage Notes (Signed)
Pt arrives via [place] from [facility] facility. Unwitnessed fall, hematoma to the top of the skull. Denies pain. Skin tears to wrist. On eliquis. GCS 15. 116/65, hr 71, 97% ra, 18 RR. DNR

## 2023-01-25 NOTE — ED Provider Notes (Signed)
[place] EMERGENCY DEPARTMENT AT [facility] Provider Note   CSN: [id number] Arrival date & time: 01/25/23  1191     History {Add pertinent medical, surgical, social history, OB history to HPI:1} Chief Complaint  Patient presents with   Newman Waren is a 89 y.o. male.  89 yo M here with a mechanical fall. Tripped on something on the floor and subsequently hit his head. No pain elsewhere. Laceration to scalp and two skin tears to left arm but no pain elsewhere. Does take Eliquis. No syncope.    Fall       Home Medications Prior to Admission medications   Medication Sig Start Date End Date Taking? Authorizing Provider  acetaminophen (TYLENOL) 325 MG tablet Take 2 tablets (650 mg total) by mouth every 6 (six) hours as needed for mild pain (or Fever >/= 101). 01/01/23   Rodolph Bong, MD  allopurinol (ZYLOPRIM) 300 MG tablet TAKE 1 TABLET(300 MG) BY MOUTH DAILY AS NEEDED FOR GOUT FLARE Patient taking differently: Take 300 mg by mouth daily as needed (gout flare). 10/30/22   Pincus Sanes, MD  amoxicillin (AMOXIL) 500 MG tablet Take 4 tablets (2,000 mg total) by mouth as directed. 1 HOUR PRIOR TO DENTAL APPOINTMENTS 11/20/21   Janetta Hora, PA-C  apixaban (ELIQUIS) 2.5 MG TABS tablet Take 1 tablet (2.5 mg total) by mouth 2 (two) times daily. 01/01/23   Rodolph Bong, MD  Cholecalciferol (VITAMIN D3) 50 MCG (2000 UT) capsule Take 2,000 Units by mouth daily.    [provider]  furosemide (LASIX) 40 MG tablet Take 1 tablet (40 mg total) by mouth daily. 01/02/23   Rodolph Bong, MD  gabapentin (NEURONTIN) 600 MG tablet Take 1 tablet (600 mg total) by mouth at bedtime. 09/30/22   Etta Grandchild, MD  hydrALAZINE (APRESOLINE) 25 MG tablet TAKE 1 TABLET(25 MG) BY MOUTH IN THE MORNING AND AT BEDTIME Patient taking differently: Take 25 mg by mouth 2 (two) times daily. 01/21/22   Tonny Bollman, MD  iron polysaccharides (NIFEREX) 150 MG  capsule Take 1 capsule (150 mg total) by mouth daily. 09/01/22   Johney Maine, MD  metoprolol tartrate (LOPRESSOR) 25 MG tablet Take 1 tablet (25 mg total) by mouth 2 (two) times daily. 03/08/21   Pincus Sanes, MD  oxybutynin (DITROPAN) 5 MG tablet Take 2 tablets (10 mg total) by mouth every 8 (eight) hours as needed for bladder spasms. 01/01/23   Rodolph Bong, MD  potassium chloride (KLOR-CON M) 10 MEQ tablet Take 1 tablet by mouth daily (with Furosemide) 10/28/22   Tonny Bollman, MD  pravastatin (PRAVACHOL) 40 MG tablet Take 1 tablet (40 mg total) by mouth daily. 07/07/22   Pincus Sanes, MD  QUEtiapine (SEROQUEL) 25 MG tablet Take 1 tablet (25 mg total) by mouth at bedtime as needed (agitation). 01/01/23   Rodolph Bong, MD  vitamin B-12 (CYANOCOBALAMIN) 500 MCG tablet Take 500 mcg by mouth daily.    [provider]      Allergies    Lisinopril and Spironolactone    Review of Systems   Review of Systems  Physical Exam Updated Vital Signs BP 99/63   Pulse 73   Temp 98.1 F (36.7 C) (Oral)   Resp 15   Ht 5\' 4"  (1.626 m)   Wt 63.5 kg   SpO2 99%   BMI 24.03 kg/m  Physical Exam Vitals and nursing note reviewed.  Constitutional:      Appearance: He is well-developed.  HENT:     Head: Normocephalic.     Comments: 2 cm lac to crown/left of scalp, hemostatic with dressing Cardiovascular:     Rate and Rhythm: Normal rate.  Pulmonary:     Effort: Pulmonary effort is normal. No respiratory distress.  Abdominal:     General: There is no distension.  Musculoskeletal:        General: Normal range of motion.     Cervical back: Normal range of motion.  Skin:    Comments: 2 medium sized skin tears to left arm  Neurological:     Mental Status: He is alert.     ED Results / Procedures / Treatments   Labs (all labs ordered are listed, but only abnormal results are displayed) Labs Reviewed  CBC WITH DIFFERENTIAL/PLATELET - Abnormal; Notable for the  following components:      Result Value   RBC 4.00 (*)    Hemoglobin 11.0 (*)    HCT 35.1 (*)    Monocytes Absolute 1.1 (*)    All other components within normal limits  COMPREHENSIVE METABOLIC PANEL - Abnormal; Notable for the following components:   Sodium 132 (*)    Chloride 94 (*)    Glucose, Bld 138 (*)    BUN 44 (*)    Creatinine, Ser 1.75 (*)    Total Protein 6.3 (*)    Albumin 3.3 (*)    GFR, Estimated 36 (*)    All other components within normal limits  PROTIME-INR - Abnormal; Notable for the following components:   Prothrombin Time 16.4 (*)    INR 1.3 (*)    All other components within normal limits  TYPE AND SCREEN    EKG None  Radiology CT Head Wo Contrast  Result Date: 01/25/2023 CLINICAL DATA:  Head trauma EXAM: CT HEAD WITHOUT CONTRAST CT CERVICAL SPINE WITHOUT CONTRAST TECHNIQUE: Multidetector CT imaging of the head and cervical spine was performed following the standard protocol without intravenous contrast. Multiplanar CT image reconstructions of the cervical spine were also generated. RADIATION DOSE REDUCTION: This exam was performed according to the departmental dose-optimization program which includes automated exposure control, adjustment of the mA and/or kV according to patient size and/or use of iterative reconstruction technique. COMPARISON:  None Available. FINDINGS: CT HEAD FINDINGS Brain: There is no mass, hemorrhage or extra-axial collection. There is generalized atrophy without lobar predilection. There is hypoattenuation of the periventricular white matter, most commonly indicating chronic ischemic microangiopathy. Vascular: Atherosclerotic calcification of the internal carotid arteries at the skull base. No abnormal hyperdensity of the major intracranial arteries or dural venous sinuses. Skull: The visualized skull base, calvarium and extracranial soft tissues are normal. Sinuses/Orbits: No fluid levels or advanced mucosal thickening of the visualized  paranasal sinuses. No mastoid or middle ear effusion. The orbits are normal. CT CERVICAL SPINE FINDINGS Alignment: Grade 1 C4-5 anterolisthesis. Facets are aligned. Occipital condyles are normally positioned. Skull base and vertebrae: No acute fracture. Soft tissues and spinal canal: No prevertebral fluid or swelling. No visible canal hematoma. Disc levels: No advanced spinal canal or neural foraminal stenosis. Upper chest: No pneumothorax, pulmonary nodule or pleural effusion. Other: Aortic and carotid atherosclerosis. IMPRESSION: 1. No acute intracranial abnormality. 2. Chronic ischemic microangiopathy and generalized atrophy. 3. No acute fracture or static subluxation of the cervical spine. Aortic Atherosclerosis (ICD10-I70.0). Electronically Signed   By: Deatra Robinson M.D.   On: 01/25/2023 02:56   CT Cervical Spine  Wo Contrast  Result Date: 01/25/2023 CLINICAL DATA:  Head trauma EXAM: CT HEAD WITHOUT CONTRAST CT CERVICAL SPINE WITHOUT CONTRAST TECHNIQUE: Multidetector CT imaging of the head and cervical spine was performed following the standard protocol without intravenous contrast. Multiplanar CT image reconstructions of the cervical spine were also generated. RADIATION DOSE REDUCTION: This exam was performed according to the departmental dose-optimization program which includes automated exposure control, adjustment of the mA and/or kV according to patient size and/or use of iterative reconstruction technique. COMPARISON:  None Available. FINDINGS: CT HEAD FINDINGS Brain: There is no mass, hemorrhage or extra-axial collection. There is generalized atrophy without lobar predilection. There is hypoattenuation of the periventricular white matter, most commonly indicating chronic ischemic microangiopathy. Vascular: Atherosclerotic calcification of the internal carotid arteries at the skull base. No abnormal hyperdensity of the major intracranial arteries or dural venous sinuses. Skull: The visualized skull  base, calvarium and extracranial soft tissues are normal. Sinuses/Orbits: No fluid levels or advanced mucosal thickening of the visualized paranasal sinuses. No mastoid or middle ear effusion. The orbits are normal. CT CERVICAL SPINE FINDINGS Alignment: Grade 1 C4-5 anterolisthesis. Facets are aligned. Occipital condyles are normally positioned. Skull base and vertebrae: No acute fracture. Soft tissues and spinal canal: No prevertebral fluid or swelling. No visible canal hematoma. Disc levels: No advanced spinal canal or neural foraminal stenosis. Upper chest: No pneumothorax, pulmonary nodule or pleural effusion. Other: Aortic and carotid atherosclerosis. IMPRESSION: 1. No acute intracranial abnormality. 2. Chronic ischemic microangiopathy and generalized atrophy. 3. No acute fracture or static subluxation of the cervical spine. Aortic Atherosclerosis (ICD10-I70.0). Electronically Signed   By: Deatra Robinson M.D.   On: 01/25/2023 02:56    Procedures .Marland KitchenLaceration Repair  Date/Time: 01/25/2023 5:26 AM  Performed by: Marily Memos, MD Authorized by: Marily Memos, MD   Consent:    Consent obtained:  Verbal   Consent given by:  Patient   Risks, benefits, and alternatives were discussed: yes     Risks discussed:  Infection, pain, poor cosmetic result and need for additional repair   Alternatives discussed:  No treatment Universal protocol:    Procedure explained and questions answered to patient or proxy's satisfaction: yes     Patient identity confirmed:  Verbally with patient Anesthesia:    Anesthesia method:  Topical application   Topical anesthetic:  LET Laceration details:    Location:  Scalp   Scalp location:  Crown   Length (cm):  2   Depth (mm):  5 Exploration:    Limited defect created (wound extended): no     Imaging obtained comment:  Head CT   Imaging outcome: foreign body not noted     Wound exploration: wound explored through full range of motion   Treatment:    Area cleansed  with:  Shur-Clens and saline   Amount of cleaning:  Standard   Irrigation solution:  Sterile saline   Irrigation volume:  200   Irrigation method:  Syringe   Visualized foreign bodies/material removed: no     Debridement:  None Skin repair:    Repair method:  Staples   Number of staples:  3 Approximation:    Approximation:  Close Repair type:    Repair type:  Simple Post-procedure details:    Dressing:  Antibiotic ointment   Procedure completion:  Tolerated     Medications Ordered in ED Medications  lactated ringers bolus 1,000 mL (1,000 mLs Intravenous New Bag/Given 01/25/23 0440)    ED Course/ Medical Decision Making/ A&P  Medical Decision Making Amount and/or Complexity of Data Reviewed Labs: ordered. Radiology: ordered.   Mild AKI, fluids given. Lac repaired as above. Skin tears per nursing. Will ambulate and likely d/c.   {Document critical care time when appropriate:1} {Document review of labs and clinical decision tools ie heart score, Chads2Vasc2 etc:1}  {Document your independent review of radiology images, and any outside records:1} {Document your discussion with family members, caretakers, and with consultants:1} {Document social determinants of health affecting pt's care:1} {Document your decision making why or why not admission, treatments were needed:1} Final Clinical Impression(s) / ED Diagnoses Final diagnoses:  None    Rx / DC Orders ED Discharge Orders     None

## 2023-01-25 NOTE — Progress Notes (Signed)
Trauma Response Nurse Documentation   Paul Mays is a 89 y.o. male arriving to [facility] ED via EMS  On Eliquis (apixaban) daily. Trauma was activated as a Level 2 by Rande Brunt based on the following trauma criteria Elderly patients > 65 with head trauma on anti-coagulation (excluding ASA). Trauma team at the bedside on patient arrival.   Patient cleared for CT by Dr. Clayborne Dana. Pt transported to CT with trauma response nurse present to monitor at 0204. RN remained with the patient throughout their absence from the department for clinical observation.   GCS 15.  History   Past Medical History:  Diagnosis Date   Anxiety    Aortic stenosis    Arthralgia    Arthritis    CAD (coronary artery disease)    Carotid stenosis    Cervicalgia    CLUSTER HEADACHE 02/10/2007   Qualifier: Diagnosis of  By: Alwyn Ren MD, Chrissie Noa     Colitis    Edema    Gout    Hearing loss    Herpes zoster without mention of complication    Hyperlipidemia    Hypertension    Hyperuricemia    Impacted cerumen    Migraine    Migraine    Murmur    Olecranon bursitis    OSA (obstructive sleep apnea)    Other and unspecified hyperlipidemia    Other malaise and fatigue    Peripheral neuropathy    Polyneuropathy    Prostate cancer    Pulmonary hypertension    S/P TAVR (transcatheter aortic valve replacement) 11/12/2021   23mm S3UR via TF approach with Dr. Excell Seltzer and Dr. [name]   Unspecified hereditary and idiopathic peripheral neuropathy    Venous insufficiency      Past Surgical History:  Procedure Laterality Date   ANGIOPLASTY     w/ 2 stents in 1999   APPENDECTOMY     CATARACT EXTRACTION W/ INTRAOCULAR LENS IMPLANT Bilateral    HEMORRHOID SURGERY     INTRAOPERATIVE TRANSTHORACIC ECHOCARDIOGRAM N/A 11/12/2021   Procedure: INTRAOPERATIVE TRANSTHORACIC ECHOCARDIOGRAM;  Surgeon: Tonny Bollman, MD;  Location: [facility] OR;  Service: Open Heart Surgery;  Laterality: N/A;   LIPOMA EXCISION     RIGHT/LEFT  HEART CATH AND CORONARY ANGIOGRAPHY N/A 10/02/2021   Procedure: RIGHT/LEFT HEART CATH AND CORONARY ANGIOGRAPHY;  Surgeon: Tonny Bollman, MD;  Location[facility] INVASIVE CV LAB;  Service: Cardiovascular;  Laterality: N/A;   TONSILLECTOMY     TRANSCATHETER AORTIC VALVE REPLACEMENT, TRANSFEMORAL N/A 11/12/2021   Procedure: TRANSCATHETER AORTIC VALVE REPLACEMENT, TRANSFEMORAL;  Surgeon: Tonny Bollman, MD;  Location: [facility] OR;  Service: Open Heart Surgery;  Laterality: N/A;   ULTRASOUND GUIDANCE FOR VASCULAR ACCESS Bilateral 11/12/2021   Procedure: ULTRASOUND GUIDANCE FOR VASCULAR ACCESS;  Surgeon: Tonny Bollman, MD;  Location: [facility] OR;  Service: Open Heart Surgery;  Laterality: Bilateral;       Initial Focused Assessment (If applicable, or please see trauma documentation): Airway intact Breathing - equal expansion, bilateral breath sounds clear Circulation - controlled bleeding on abrasion to L lateral head, skin abrasions to L upper arm and L wrist, bleeding controlled, bandages applied by EMS. Disability - GCS 15  CT's Completed:   CT Head and CT C-Spine   Plan for disposition:  Other  Discharge back to [facility]  Consults completed:  None.  Event Summary:  Pt arrived via [facility] from [facility] hand re[facility] after unwitnessed fall, found by stall w/ bleeding to top of head. Pt states  he was walking back from the bathroom when he fell. Pt denies pain.18g PIV established in R AC.   Bedside handoff with ED Paramedic, Brynda Rim E Antawan Mchugh  Trauma Response RN  Please call TRN at [phone] for further assistance.

## 2023-01-25 NOTE — ED Notes (Signed)
Spoke with son Aurther Loft, who was advised that [facility] likely would not transport since he is ambulatory with assistance and has no O2 requirement... They advised that they would come get him.

## 2023-01-25 NOTE — Progress Notes (Signed)
   01/25/23 0245  Spiritual Encounters  Type of Visit Initial  Care provided to: Patient  Referral source Trauma page  Reason for visit Trauma  OnCall Visit Yes   Chap responded to Fall on [place] page. Spoke with PT briefly who stated that his son would come visit in the morning.  No other support person present.  Chaplain remains available via page.

## 2023-01-25 NOTE — ED Notes (Signed)
TRN performed wound care. Pt's head cleaned with 0.9% NS irrigation and wound cleanser. Pt has approx 2in lac to L top of head, swelling noted to L lateral side of head.

## 2023-01-25 NOTE — ED Notes (Signed)
Patient ambulated to bathroom with assistance without issue.

## 2023-01-26 DIAGNOSIS — F419 Anxiety disorder, unspecified: Secondary | ICD-10-CM

## 2023-01-26 DIAGNOSIS — H9190 Unspecified hearing loss, unspecified ear: Secondary | ICD-10-CM

## 2023-01-26 DIAGNOSIS — N3001 Acute cystitis with hematuria: Secondary | ICD-10-CM | POA: Diagnosis not present

## 2023-01-26 DIAGNOSIS — B952 Enterococcus as the cause of diseases classified elsewhere: Secondary | ICD-10-CM | POA: Diagnosis not present

## 2023-01-26 DIAGNOSIS — I2510 Atherosclerotic heart disease of native coronary artery without angina pectoris: Secondary | ICD-10-CM

## 2023-01-26 DIAGNOSIS — M1990 Unspecified osteoarthritis, unspecified site: Secondary | ICD-10-CM

## 2023-01-26 DIAGNOSIS — I272 Pulmonary hypertension, unspecified: Secondary | ICD-10-CM | POA: Diagnosis not present

## 2023-01-26 DIAGNOSIS — G609 Hereditary and idiopathic neuropathy, unspecified: Secondary | ICD-10-CM

## 2023-01-26 DIAGNOSIS — C61 Malignant neoplasm of prostate: Secondary | ICD-10-CM | POA: Diagnosis not present

## 2023-01-26 DIAGNOSIS — E785 Hyperlipidemia, unspecified: Secondary | ICD-10-CM | POA: Diagnosis not present

## 2023-01-26 DIAGNOSIS — M7020 Olecranon bursitis, unspecified elbow: Secondary | ICD-10-CM

## 2023-01-26 DIAGNOSIS — F05 Delirium due to known physiological condition: Secondary | ICD-10-CM

## 2023-01-26 DIAGNOSIS — I6529 Occlusion and stenosis of unspecified carotid artery: Secondary | ICD-10-CM

## 2023-01-26 DIAGNOSIS — I5032 Chronic diastolic (congestive) heart failure: Secondary | ICD-10-CM | POA: Diagnosis not present

## 2023-01-26 DIAGNOSIS — N179 Acute kidney failure, unspecified: Secondary | ICD-10-CM | POA: Diagnosis not present

## 2023-01-26 DIAGNOSIS — N183 Chronic kidney disease, stage 3 unspecified: Secondary | ICD-10-CM | POA: Diagnosis not present

## 2023-01-26 DIAGNOSIS — G43909 Migraine, unspecified, not intractable, without status migrainosus: Secondary | ICD-10-CM | POA: Diagnosis not present

## 2023-01-26 DIAGNOSIS — I350 Nonrheumatic aortic (valve) stenosis: Secondary | ICD-10-CM

## 2023-01-26 DIAGNOSIS — D631 Anemia in chronic kidney disease: Secondary | ICD-10-CM | POA: Diagnosis not present

## 2023-01-26 DIAGNOSIS — G4733 Obstructive sleep apnea (adult) (pediatric): Secondary | ICD-10-CM

## 2023-01-26 DIAGNOSIS — I130 Hypertensive heart and chronic kidney disease with heart failure and stage 1 through stage 4 chronic kidney disease, or unspecified chronic kidney disease: Secondary | ICD-10-CM | POA: Diagnosis not present

## 2023-01-26 DIAGNOSIS — I480 Paroxysmal atrial fibrillation: Secondary | ICD-10-CM | POA: Diagnosis not present

## 2023-01-27 ENCOUNTER — Telehealth: Payer: Self-pay | Admitting: Internal Medicine

## 2023-01-27 NOTE — Telephone Encounter (Signed)
Forms retrieved from box today

## 2023-01-27 NOTE — Telephone Encounter (Signed)
Family member dropped off paperwork for provider to fill out. Needs to be emailed to facility when complete - all instructions are in envelope.  Paperwork is up front in provider's box. 

## 2023-01-28 ENCOUNTER — Other Ambulatory Visit: Payer: Self-pay | Admitting: Internal Medicine

## 2023-01-28 DIAGNOSIS — I5032 Chronic diastolic (congestive) heart failure: Secondary | ICD-10-CM | POA: Diagnosis not present

## 2023-01-28 DIAGNOSIS — M6259 Muscle wasting and atrophy, not elsewhere classified, multiple sites: Secondary | ICD-10-CM | POA: Diagnosis not present

## 2023-01-28 DIAGNOSIS — M6281 Muscle weakness (generalized): Secondary | ICD-10-CM | POA: Diagnosis not present

## 2023-01-28 DIAGNOSIS — I480 Paroxysmal atrial fibrillation: Secondary | ICD-10-CM | POA: Diagnosis not present

## 2023-01-28 DIAGNOSIS — R41841 Cognitive communication deficit: Secondary | ICD-10-CM | POA: Diagnosis not present

## 2023-01-29 ENCOUNTER — Telehealth: Payer: Self-pay

## 2023-01-29 DIAGNOSIS — Z0279 Encounter for issue of other medical certificate: Secondary | ICD-10-CM

## 2023-01-29 DIAGNOSIS — I5032 Chronic diastolic (congestive) heart failure: Secondary | ICD-10-CM

## 2023-01-29 DIAGNOSIS — G8929 Other chronic pain: Secondary | ICD-10-CM

## 2023-01-29 DIAGNOSIS — R5381 Other malaise: Secondary | ICD-10-CM

## 2023-01-29 DIAGNOSIS — N1831 Chronic kidney disease, stage 3a: Secondary | ICD-10-CM

## 2023-01-29 DIAGNOSIS — G609 Hereditary and idiopathic neuropathy, unspecified: Secondary | ICD-10-CM

## 2023-01-29 DIAGNOSIS — I422 Other hypertrophic cardiomyopathy: Secondary | ICD-10-CM

## 2023-01-29 DIAGNOSIS — R600 Localized edema: Secondary | ICD-10-CM

## 2023-01-29 NOTE — Telephone Encounter (Signed)
Dme for hosp bed printed.   Referral ordered for PT

## 2023-01-30 NOTE — Telephone Encounter (Signed)
Patients son's states pt needs an order for a wheelchair also.

## 2023-01-30 NOTE — Addendum Note (Signed)
Addended by: Pincus Sanes on: 01/30/2023 04:22 PM   Modules accepted: Orders

## 2023-01-30 NOTE — Telephone Encounter (Signed)
printed

## 2023-02-02 ENCOUNTER — Other Ambulatory Visit: Payer: Self-pay

## 2023-02-02 DIAGNOSIS — D649 Anemia, unspecified: Secondary | ICD-10-CM

## 2023-02-03 ENCOUNTER — Inpatient Hospital Stay: Payer: Medicare Other

## 2023-02-03 ENCOUNTER — Other Ambulatory Visit: Payer: Self-pay | Admitting: *Deleted

## 2023-02-03 ENCOUNTER — Inpatient Hospital Stay: Payer: Medicare Other | Admitting: Hematology

## 2023-02-03 DIAGNOSIS — I480 Paroxysmal atrial fibrillation: Secondary | ICD-10-CM | POA: Diagnosis not present

## 2023-02-03 DIAGNOSIS — M6281 Muscle weakness (generalized): Secondary | ICD-10-CM | POA: Diagnosis not present

## 2023-02-03 DIAGNOSIS — I5032 Chronic diastolic (congestive) heart failure: Secondary | ICD-10-CM | POA: Diagnosis not present

## 2023-02-03 DIAGNOSIS — R41841 Cognitive communication deficit: Secondary | ICD-10-CM | POA: Diagnosis not present

## 2023-02-03 DIAGNOSIS — M6259 Muscle wasting and atrophy, not elsewhere classified, multiple sites: Secondary | ICD-10-CM | POA: Diagnosis not present

## 2023-02-03 NOTE — Patient Outreach (Signed)
[facility] Post- Acute Care Coordinator follow up. [name] resides in [place] skilled nursing facility. Screening for potential [facility] care coordination services as a benefit of health plan and primary care provider.  Update previously received from [place] Child psychotherapist. Anticipated transition plan is [facility].   Will continue to follow.     Paul Noble, MSN, RN,BSN [facility] Post Acute Care Coordinator [phone] [facility]) [phone]  (Toll free office)

## 2023-02-04 ENCOUNTER — Telehealth: Payer: Self-pay | Admitting: Hematology

## 2023-02-04 ENCOUNTER — Ambulatory Visit: Payer: Self-pay | Admitting: Licensed Clinical Social Worker

## 2023-02-04 NOTE — Patient Instructions (Signed)
Visit Information  Thank you for taking time to visit with me today. Please don't hesitate to contact me if I can be of assistance to you.    Please call the care guide team at [phone] if you need to cancel or reschedule your appointment.   The patient verbalized understanding of instructions, educational materials, and care plan provided today and DECLINED offer to receive copy of patient instructions, educational materials, and care plan.   No further follow up required: at this time  Sammuel Hines, [employer] Social Work Care Coordination  [facility] [employer] [phone]

## 2023-02-04 NOTE — Patient Outreach (Signed)
  Care Coordination  Follow Up Visit Note   02/04/2023 Name: Paul Mays MRN: [id number] DOB: 05-29-1928  Paul Mays is a 89 y.o. year old male who sees Burns, Bobette Mo, MD for primary care. I spoke with  Paul Mays's son Paul Mays  by phone today.  What matters to the patients health and wellness today?  Leaving [facility]  Per son Paul Mays patient has made progress at rehab and will be moving to [facility] with his wife tomorrow 02/05/23  SDOH assessments and interventions completed:  No  Care Coordination Interventions:  No, not indicated   Follow up plan: No further intervention required. LCSW will disconnect from care team  Encounter Outcome:  Pt. Visit Completed   Paul Hines, LCSW Social Work Care Coordination  [facility] [employer] [phone]

## 2023-02-06 DIAGNOSIS — I480 Paroxysmal atrial fibrillation: Secondary | ICD-10-CM | POA: Diagnosis not present

## 2023-02-06 DIAGNOSIS — I5032 Chronic diastolic (congestive) heart failure: Secondary | ICD-10-CM | POA: Diagnosis not present

## 2023-02-06 DIAGNOSIS — M6259 Muscle wasting and atrophy, not elsewhere classified, multiple sites: Secondary | ICD-10-CM | POA: Diagnosis not present

## 2023-02-06 DIAGNOSIS — R41841 Cognitive communication deficit: Secondary | ICD-10-CM | POA: Diagnosis not present

## 2023-02-06 DIAGNOSIS — M6281 Muscle weakness (generalized): Secondary | ICD-10-CM | POA: Diagnosis not present

## 2023-02-10 ENCOUNTER — Other Ambulatory Visit: Payer: Self-pay | Admitting: Internal Medicine

## 2023-02-10 ENCOUNTER — Ambulatory Visit: Payer: Medicare Other | Admitting: Internal Medicine

## 2023-02-10 ENCOUNTER — Other Ambulatory Visit: Payer: Self-pay | Admitting: *Deleted

## 2023-02-10 DIAGNOSIS — Z79899 Other long term (current) drug therapy: Secondary | ICD-10-CM

## 2023-02-10 DIAGNOSIS — R600 Localized edema: Secondary | ICD-10-CM

## 2023-02-10 DIAGNOSIS — Z952 Presence of prosthetic heart valve: Secondary | ICD-10-CM

## 2023-02-10 NOTE — Patient Outreach (Signed)
[facility] Post- Acute Care Coordinator follow up. Per [facility] Paul Mays discharged from [facility] on 02/09/23. Transitioned to [facility].   No identifiable [facility] care coordination needs.  Paul Noble, MSN, RN,BSN [facility] Post Acute Care Coordinator [phone] (Direct dial)

## 2023-02-12 ENCOUNTER — Encounter: Payer: Self-pay | Admitting: Internal Medicine

## 2023-02-12 NOTE — Patient Instructions (Addendum)
      Blood work was ordered.      Medications changes include :   stop hydralazine 25 mg twice daily -- start hydralazine 10 mg twice daily       Return for follow up as scheduled.

## 2023-02-12 NOTE — Progress Notes (Signed)
Subjective:    Patient ID: Paul Mays, male    DOB: 08/28/1928, 89 y.o.   MRN: [id number]     HPI Paul Mays is here for follow up from rehab from [facility].  Feels lightheaded a lot.    Ct scan next Tuesday and then sees urology.    Medications and allergies reviewed with patient and updated if appropriate.  Current Outpatient Medications on File Prior to Visit  Medication Sig Dispense Refill   acetaminophen (TYLENOL) 325 MG tablet GIVE 2 TABS (650MG ) BY MOUTH EVERY 6 HOURS AS NEEDED FOR PAIN 240 tablet 11   allopurinol (ZYLOPRIM) 300 MG tablet TAKE 1 TABLET(300 MG) BY MOUTH DAILY AS NEEDED FOR GOUT FLARE 90 tablet 0   amoxicillin (AMOXIL) 500 MG tablet GIVE 4 TABS (2000MG ) BY MOUTH 1H BEFORE APPT 4 tablet 2   apixaban (ELIQUIS) 2.5 MG TABS tablet Take 1 tablet (2.5 mg total) by mouth 2 (two) times daily. 60 tablet 2   Cholecalciferol (VITAMIN D3) 50 MCG (2000 UT) capsule GIVE 1 CAP BY MOUTH ONCE DAILY 30 capsule 11   furosemide (LASIX) 40 MG tablet GIVE 1 TAB BY MOUTH ONCE DAILY 30 tablet 11   gabapentin (NEURONTIN) 600 MG tablet Take 1 tablet (600 mg total) by mouth at bedtime. 90 tablet 1   hydrALAZINE (APRESOLINE) 25 MG tablet GIVE 1 TAB BY MOUTH TWICE DAILY (MORNING AND BEDTIME) 60 tablet 11   iron polysaccharides (NIFEREX) 150 MG capsule Take 1 capsule (150 mg total) by mouth daily. 30 capsule 5   metoprolol tartrate (LOPRESSOR) 25 MG tablet GIVE 1 TAB BY MOUTH TWICE DAILY 60 tablet 11   oxybutynin (DITROPAN) 5 MG tablet Take 2 tablets (10 mg total) by mouth every 8 (eight) hours as needed for bladder spasms. 20 tablet 0   potassium chloride (KLOR-CON M) 10 MEQ tablet Take 1 tablet by mouth daily (with Furosemide) 90 tablet 3   pravastatin (PRAVACHOL) 40 MG tablet GIVE 1 TAB BY MOUTH ONCE DAILY 30 tablet 11   QUEtiapine (SEROQUEL) 25 MG tablet GIVE 1 TAB BY MOUTH AT BEDTIME 30 tablet 5   vitamin B-12 (CYANOCOBALAMIN) 500 MCG tablet GIVE 1 TAB BY MOUTH  ONCE DAILY 30 tablet 11   No current facility-administered medications on file prior to visit.     Review of Systems  Constitutional:  Negative for fever.  Respiratory:  Negative for cough, shortness of breath and wheezing.   Cardiovascular:  Positive for leg swelling. Negative for chest pain and palpitations.  Gastrointestinal:  Negative for abdominal pain, constipation and diarrhea.  Genitourinary:  Negative for hematuria.  Neurological:  Positive for light-headedness. Negative for dizziness and headaches.       Objective:   Vitals:   02/13/23 1402  BP: 106/78  Pulse: 78  Temp: 98.2 F (36.8 C)  SpO2: 98%   BP Readings from Last 3 Encounters:  02/13/23 106/78  01/25/23 (!) 95/50  01/20/23 131/82   Wt Readings from Last 3 Encounters:  02/13/23 134 lb (60.8 kg)  01/25/23 140 lb (63.5 kg)  01/16/23 154 lb (69.9 kg)   Body mass index is 23 kg/m.    Physical Exam Constitutional:      General: He is not in acute distress.    Appearance: Normal appearance. He is not ill-appearing.  HENT:     Head: Normocephalic and atraumatic.  Eyes:     Conjunctiva/sclera: Conjunctivae normal.  Cardiovascular:     Rate and  Rhythm: Normal rate and regular rhythm.     Heart sounds: Murmur heard.  Pulmonary:     Effort: Pulmonary effort is normal. No respiratory distress.     Breath sounds: Normal breath sounds. No wheezing or rales.  Musculoskeletal:     Right lower leg: Edema (1 + pitting edema) present.     Left lower leg: Edema (1 + pitting edema) present.  Skin:    General: Skin is warm and dry.     Findings: No rash.  Neurological:     Mental Status: He is alert. Mental status is at baseline.  Psychiatric:        Mood and Affect: Mood normal.        Lab Results  Component Value Date   WBC 9.0 01/25/2023   HGB 11.0 (L) 01/25/2023   HCT 35.1 (L) 01/25/2023   PLT 189 01/25/2023   GLUCOSE 138 (H) 01/25/2023   CHOL 149 12/09/2017   TRIG 57.0 12/09/2017   HDL  72.30 12/09/2017   LDLCALC 65 12/09/2017   ALT 29 01/25/2023   AST 41 01/25/2023   NA 132 (L) 01/25/2023   K 4.7 01/25/2023   CL 94 (L) 01/25/2023   CREATININE 1.75 (H) 01/25/2023   BUN 44 (H) 01/25/2023   CO2 29 01/25/2023   TSH 2.34 02/19/2021   PSA 0.00 (L) 02/19/2021   INR 1.3 (H) 01/25/2023   HGBA1C 4.9 11/08/2021     Assessment & Plan:    See Problem List for Assessment and Plan of chronic medical problems.

## 2023-02-13 ENCOUNTER — Ambulatory Visit (INDEPENDENT_AMBULATORY_CARE_PROVIDER_SITE_OTHER): Payer: Medicare Other | Admitting: Internal Medicine

## 2023-02-13 VITALS — BP 106/78 | HR 78 | Temp 98.2°F | Ht 64.0 in | Wt 134.0 lb

## 2023-02-13 DIAGNOSIS — R451 Restlessness and agitation: Secondary | ICD-10-CM | POA: Diagnosis not present

## 2023-02-13 DIAGNOSIS — R5381 Other malaise: Secondary | ICD-10-CM | POA: Diagnosis not present

## 2023-02-13 DIAGNOSIS — D649 Anemia, unspecified: Secondary | ICD-10-CM

## 2023-02-13 DIAGNOSIS — R310 Gross hematuria: Secondary | ICD-10-CM | POA: Diagnosis not present

## 2023-02-13 DIAGNOSIS — R600 Localized edema: Secondary | ICD-10-CM

## 2023-02-13 DIAGNOSIS — I10 Essential (primary) hypertension: Secondary | ICD-10-CM

## 2023-02-13 DIAGNOSIS — N1831 Chronic kidney disease, stage 3a: Secondary | ICD-10-CM

## 2023-02-13 DIAGNOSIS — I480 Paroxysmal atrial fibrillation: Secondary | ICD-10-CM

## 2023-02-13 DIAGNOSIS — I5032 Chronic diastolic (congestive) heart failure: Secondary | ICD-10-CM | POA: Diagnosis not present

## 2023-02-13 LAB — CBC WITH DIFFERENTIAL/PLATELET
Basophils Absolute: 0.1 10*3/uL (ref 0.0–0.1)
Basophils Relative: 1 % (ref 0.0–3.0)
Eosinophils Absolute: 0.2 10*3/uL (ref 0.0–0.7)
Eosinophils Relative: 2.4 % (ref 0.0–5.0)
HCT: 27.7 % — ABNORMAL LOW (ref 39.0–52.0)
Hemoglobin: 8.8 g/dL — ABNORMAL LOW (ref 13.0–17.0)
Lymphocytes Relative: 11.9 % — ABNORMAL LOW (ref 12.0–46.0)
Lymphs Abs: 0.8 10*3/uL (ref 0.7–4.0)
MCHC: 31.9 g/dL (ref 30.0–36.0)
MCV: 88.3 fl (ref 78.0–100.0)
Monocytes Absolute: 0.6 10*3/uL (ref 0.1–1.0)
Monocytes Relative: 9.6 % (ref 3.0–12.0)
Neutro Abs: 4.9 10*3/uL (ref 1.4–7.7)
Neutrophils Relative %: 75.1 % (ref 43.0–77.0)
Platelets: 152 10*3/uL (ref 150.0–400.0)
RBC: 3.13 Mil/uL — ABNORMAL LOW (ref 4.22–5.81)
RDW: 20 % — ABNORMAL HIGH (ref 11.5–15.5)
WBC: 6.5 10*3/uL (ref 4.0–10.5)

## 2023-02-13 LAB — COMPREHENSIVE METABOLIC PANEL
ALT: 16 U/L (ref 0–53)
AST: 23 U/L (ref 0–37)
Albumin: 3.8 g/dL (ref 3.5–5.2)
Alkaline Phosphatase: 40 U/L (ref 39–117)
BUN: 41 mg/dL — ABNORMAL HIGH (ref 6–23)
CO2: 33 mEq/L — ABNORMAL HIGH (ref 19–32)
Calcium: 9.1 mg/dL (ref 8.4–10.5)
Chloride: 100 mEq/L (ref 96–112)
Creatinine, Ser: 1.87 mg/dL — ABNORMAL HIGH (ref 0.40–1.50)
GFR: 30.67 mL/min — ABNORMAL LOW (ref >60.00)
Glucose, Bld: 94 mg/dL (ref 70–99)
Potassium: 4.3 mEq/L (ref 3.5–5.1)
Sodium: 142 mEq/L (ref 135–145)
Total Bilirubin: 1.4 mg/dL — ABNORMAL HIGH (ref 0.2–1.2)
Total Protein: 6.5 g/dL (ref 6.0–8.3)

## 2023-02-13 MED ORDER — HYDRALAZINE HCL 10 MG PO TABS
10.0000 mg | ORAL_TABLET | Freq: Two times a day (BID) | ORAL | 3 refills | Status: DC
Start: 2023-02-13 — End: 2023-03-17

## 2023-02-13 NOTE — Assessment & Plan Note (Signed)
Chronic Bilateral edema lower extremities Continue Lasix 40 mg daily

## 2023-02-13 NOTE — Assessment & Plan Note (Signed)
Currently not experiencing any gross hematuria Following with urology for further evaluation CBC today

## 2023-02-13 NOTE — Assessment & Plan Note (Signed)
Chronic On Eliquis  2.5 mg twice daily On metoprolol 25 mg twice daily Following with cardiology

## 2023-02-13 NOTE — Assessment & Plan Note (Signed)
Chronic BMP today since most recent kidney function was decreased

## 2023-02-13 NOTE — Assessment & Plan Note (Signed)
Chronic He is experiencing some lightheadedness Blood pressure on the low side Continue metoprolol 25 mg twice daily Decrease hydralazine to 10 mg twice daily-depending on blood pressure may be able to discontinue this

## 2023-02-13 NOTE — Assessment & Plan Note (Signed)
Related to gross hematuria-workup being done by urology most recent CBC showed worsening of anemia CBC today

## 2023-02-13 NOTE — Assessment & Plan Note (Signed)
Chronic Has some mild edema which is chronic, but denies any shortness of breath Looks euvolemic Continue Lasix 40 mg daily, metoprolol 25 mg twice daily

## 2023-02-13 NOTE — Assessment & Plan Note (Signed)
Had recent agitation while in the hospital and currently taking Seroquel 25 mg at bedtime-doing well with this so we will continue

## 2023-02-17 ENCOUNTER — Telehealth: Payer: Self-pay | Admitting: Internal Medicine

## 2023-02-17 NOTE — Addendum Note (Signed)
Addended by: Pincus Sanes on: 02/17/2023 08:36 AM   Modules accepted: Orders

## 2023-02-17 NOTE — Telephone Encounter (Signed)
Paul Mays from [facility] assisted living called to say she faxed paper work over on 02/13/2023. She wanted to make sure it has been received. Best callback for [facility] is [phone].

## 2023-02-17 NOTE — Telephone Encounter (Signed)
Paperwork faxed today.

## 2023-02-18 DIAGNOSIS — N200 Calculus of kidney: Secondary | ICD-10-CM | POA: Diagnosis not present

## 2023-02-18 DIAGNOSIS — R310 Gross hematuria: Secondary | ICD-10-CM | POA: Diagnosis not present

## 2023-02-18 DIAGNOSIS — K8020 Calculus of gallbladder without cholecystitis without obstruction: Secondary | ICD-10-CM | POA: Diagnosis not present

## 2023-02-19 ENCOUNTER — Telehealth: Payer: Self-pay | Admitting: Internal Medicine

## 2023-02-19 NOTE — Telephone Encounter (Signed)
Patient's son dropped off DNR paperwork that facility says need to be completed by provider. Please call when ready at [phone] (Billy) Paperwork placed in provider's box up front. 

## 2023-02-20 NOTE — Telephone Encounter (Signed)
Spoke with son and paperwork left up front for pick up.

## 2023-02-20 NOTE — Telephone Encounter (Signed)
Paperwork placed in Dr. Lawerance Bach office to sign.

## 2023-02-23 ENCOUNTER — Telehealth: Payer: Self-pay

## 2023-02-23 ENCOUNTER — Other Ambulatory Visit (INDEPENDENT_AMBULATORY_CARE_PROVIDER_SITE_OTHER): Payer: Medicare Other

## 2023-02-23 ENCOUNTER — Telehealth: Payer: Self-pay | Admitting: Internal Medicine

## 2023-02-23 DIAGNOSIS — D649 Anemia, unspecified: Secondary | ICD-10-CM

## 2023-02-23 DIAGNOSIS — N1831 Chronic kidney disease, stage 3a: Secondary | ICD-10-CM | POA: Diagnosis not present

## 2023-02-23 DIAGNOSIS — N179 Acute kidney failure, unspecified: Secondary | ICD-10-CM

## 2023-02-23 LAB — CBC WITH DIFFERENTIAL/PLATELET
Basophils Absolute: 0.1 10*3/uL (ref 0.0–0.1)
Basophils Relative: 1.9 % (ref 0.0–3.0)
Eosinophils Absolute: 0.2 10*3/uL (ref 0.0–0.7)
Eosinophils Relative: 3.8 % (ref 0.0–5.0)
HCT: 26.6 % — ABNORMAL LOW (ref 39.0–52.0)
Hemoglobin: 8.5 g/dL — ABNORMAL LOW (ref 13.0–17.0)
Lymphocytes Relative: 12.5 % (ref 12.0–46.0)
Lymphs Abs: 0.7 10*3/uL (ref 0.7–4.0)
MCHC: 31.9 g/dL (ref 30.0–36.0)
MCV: 89.9 fl (ref 78.0–100.0)
Monocytes Absolute: 0.6 10*3/uL (ref 0.1–1.0)
Monocytes Relative: 10.8 % (ref 3.0–12.0)
Neutro Abs: 3.9 10*3/uL (ref 1.4–7.7)
Neutrophils Relative %: 71 % (ref 43.0–77.0)
Platelets: 167 10*3/uL (ref 150.0–400.0)
RBC: 2.96 Mil/uL — ABNORMAL LOW (ref 4.22–5.81)
RDW: 22.2 % — ABNORMAL HIGH (ref 11.5–15.5)
WBC: 5.5 10*3/uL (ref 4.0–10.5)

## 2023-02-23 LAB — BASIC METABOLIC PANEL
BUN: 54 mg/dL — ABNORMAL HIGH (ref 6–23)
CO2: 27 mEq/L (ref 19–32)
Calcium: 9.3 mg/dL (ref 8.4–10.5)
Chloride: 102 mEq/L (ref 96–112)
Creatinine, Ser: 3.41 mg/dL — ABNORMAL HIGH (ref 0.40–1.50)
GFR: 14.91 mL/min — CL (ref >60.00)
Glucose, Bld: 106 mg/dL — ABNORMAL HIGH (ref 70–99)
Potassium: 5.4 mEq/L — ABNORMAL HIGH (ref 3.5–5.1)
Sodium: 139 mEq/L (ref 135–145)

## 2023-02-23 LAB — IBC PANEL
Iron: 49 ug/dL (ref 42–165)
Saturation Ratios: 13.5 % — ABNORMAL LOW (ref 20.0–50.0)
TIBC: 364 ug/dL (ref 250.0–450.0)
Transferrin: 260 mg/dL (ref 212.0–360.0)

## 2023-02-23 NOTE — Telephone Encounter (Signed)
Results given to son and he will bring patient back by on Wednesday for recheck.

## 2023-02-23 NOTE — Telephone Encounter (Signed)
Please call son.  Blood work today shows his kidney function is significantly decreased from previous.  It is much lower.  Hold Lasix for couple of days.  Lets try holding the gabapentin at night for now.     Referral ordered for nephrology-kidney specialist, which is different than the urologist.  Need to repeat kidney function on Wednesday.  Ordered.    The remainder of his blood work is still pending.

## 2023-02-23 NOTE — Telephone Encounter (Signed)
CRITICAL VALUE STICKER  CRITICAL VALUE: GFR 14.91  RECEIVER (on-site recipient of call): Delorise Shiner  DATE & TIME NOTIFIED: 4:05pm @ 02/23/23  MESSENGER (representative from lab): Hope  MD NOTIFIED: Yes  TIME OF NOTIFICATION: 4:06pm

## 2023-02-24 LAB — FERRITIN: Ferritin: 43.3 ng/mL (ref 22.0–322.0)

## 2023-02-24 NOTE — Telephone Encounter (Signed)
Copy of note faxed today.

## 2023-02-24 NOTE — Telephone Encounter (Signed)
Assisted living need the orders to stop the lasix and stop the gabapentin stopped as of now.  Please fax to :  [phone] -

## 2023-02-25 ENCOUNTER — Other Ambulatory Visit (INDEPENDENT_AMBULATORY_CARE_PROVIDER_SITE_OTHER): Payer: Medicare Other

## 2023-02-25 DIAGNOSIS — N401 Enlarged prostate with lower urinary tract symptoms: Secondary | ICD-10-CM | POA: Diagnosis not present

## 2023-02-25 DIAGNOSIS — R8289 Other abnormal findings on cytological and histological examination of urine: Secondary | ICD-10-CM | POA: Diagnosis not present

## 2023-02-25 DIAGNOSIS — N179 Acute kidney failure, unspecified: Secondary | ICD-10-CM

## 2023-02-25 DIAGNOSIS — R350 Frequency of micturition: Secondary | ICD-10-CM | POA: Diagnosis not present

## 2023-02-25 DIAGNOSIS — R310 Gross hematuria: Secondary | ICD-10-CM | POA: Diagnosis not present

## 2023-02-25 LAB — BASIC METABOLIC PANEL
BUN: 48 mg/dL — ABNORMAL HIGH (ref 6–23)
CO2: 26 mEq/L (ref 19–32)
Calcium: 9.2 mg/dL (ref 8.4–10.5)
Chloride: 105 mEq/L (ref 96–112)
Creatinine, Ser: 2.46 mg/dL — ABNORMAL HIGH (ref 0.40–1.50)
GFR: 22.06 mL/min — ABNORMAL LOW (ref >60.00)
Glucose, Bld: 86 mg/dL (ref 70–99)
Potassium: 4.7 mEq/L (ref 3.5–5.1)
Sodium: 140 mEq/L (ref 135–145)

## 2023-02-26 ENCOUNTER — Telehealth: Payer: Self-pay | Admitting: Internal Medicine

## 2023-02-26 MED ORDER — POLYSACCHARIDE IRON COMPLEX 150 MG PO CAPS: 150.0000 mg | ORAL_CAPSULE | Freq: Every day | ORAL | 5 refills | Status: DC

## 2023-02-26 NOTE — Telephone Encounter (Signed)
Call his son.  Kidney function is a little improved, but still significantly decreased.  I had referred him to the kidney specialist to ensure they have not heard from them about making an appointment.  We need to monitor his fluid status because of his heart and kidneys.  He should make a follow-up appointment with the heart doctor.  He should come in next week to see me.  He is anemic.  The anemia is worse than last month.  Is he currently having blood in the urine?  He should start taking an iron pill daily.  He sees hematology/oncology early June which can help [name] with the anemia.

## 2023-02-26 NOTE — Telephone Encounter (Signed)
Hold gabapentin for now,  Lasix can be given three times a week for now and we can adjust.  Iron sent to [facility]

## 2023-02-27 ENCOUNTER — Telehealth: Payer: Self-pay | Admitting: Internal Medicine

## 2023-02-27 DIAGNOSIS — R2689 Other abnormalities of gait and mobility: Secondary | ICD-10-CM

## 2023-02-27 DIAGNOSIS — R5381 Other malaise: Secondary | ICD-10-CM

## 2023-02-27 NOTE — Telephone Encounter (Signed)
Updated order info and faxed to [facility] this morning.  Son updated on medication changes.

## 2023-02-27 NOTE — Telephone Encounter (Signed)
Pt son called wanting a referral sent over to [facility] for PT. Phone [phone]

## 2023-02-27 NOTE — Telephone Encounter (Signed)
Referral ordered

## 2023-03-03 ENCOUNTER — Other Ambulatory Visit: Payer: Self-pay | Admitting: Internal Medicine

## 2023-03-04 ENCOUNTER — Telehealth: Payer: Self-pay | Admitting: Internal Medicine

## 2023-03-04 ENCOUNTER — Other Ambulatory Visit: Payer: Self-pay

## 2023-03-04 DIAGNOSIS — R300 Dysuria: Secondary | ICD-10-CM

## 2023-03-04 NOTE — Progress Notes (Unsigned)
Subjective:    Patient ID: Paul Mays, male    DOB: Jul 18, 1928, 89 y.o.   MRN: [id number]     HPI Assad is here for follow up of his chronic medical problems.  Blood work  - worsening anemia, GFR -- lasix and gabapentin held.  Repeat labs better - lasix started TIW, gabapentin still on hold.  After reviewing medication records from facility Lasix was 1 day only.  Gabapentin was stopped.  Seroquel was stopped.  He has been getting naproxen twice a day.  Currently also takes Advil.  Anemia - no recent blood in urine.  No bladder mass.  Has recently seen urology.  Son was concerned about a possible UTI because behavior changes-  Behavior erractic at times- yesterday -he took his clothes off. Last three days he did not eat - was not hungry.  Has been lethargic - laying in bed, not talkative..  Sunday night - got up took off all his clothes and urinated on the floor,    This morning he did eat normally.  He states he did have dinner last night.  Medications and allergies reviewed with patient and updated if appropriate.  Current Outpatient Medications on File Prior to Visit  Medication Sig Dispense Refill   acetaminophen (TYLENOL) 325 MG tablet GIVE 2 TABS (650MG ) BY MOUTH EVERY 6 HOURS AS NEEDED FOR PAIN 240 tablet 11   allopurinol (ZYLOPRIM) 300 MG tablet TAKE 1 TABLET(300 MG) BY MOUTH DAILY AS NEEDED FOR GOUT FLARE 90 tablet 0   amoxicillin (AMOXIL) 500 MG tablet GIVE 4 TABS (2000MG ) BY MOUTH 1H BEFORE APPT 4 tablet 2   apixaban (ELIQUIS) 2.5 MG TABS tablet GIVE 1 TAB BY MOUTH TWICE DAILY 60 tablet 5   Cholecalciferol (VITAMIN D3) 50 MCG (2000 UT) capsule GIVE 1 CAP BY MOUTH ONCE DAILY 30 capsule 11   furosemide (LASIX) 40 MG tablet GIVE 1 TAB BY MOUTH ONCE DAILY 30 tablet 11   gabapentin (NEURONTIN) 600 MG tablet Take 1 tablet (600 mg total) by mouth at bedtime. 90 tablet 1   hydrALAZINE (APRESOLINE) 10 MG tablet Take 1 tablet (10 mg total) by mouth 2 (two) times daily.  180 tablet 3   iron polysaccharides (NIFEREX) 150 MG capsule Take 1 capsule (150 mg total) by mouth daily. 30 capsule 5   metoprolol tartrate (LOPRESSOR) 25 MG tablet GIVE 1 TAB BY MOUTH TWICE DAILY 60 tablet 11   oxybutynin (DITROPAN) 5 MG tablet Take 2 tablets (10 mg total) by mouth every 8 (eight) hours as needed for bladder spasms. 20 tablet 0   potassium chloride (KLOR-CON M) 10 MEQ tablet Take 1 tablet by mouth daily (with Furosemide) 90 tablet 3   pravastatin (PRAVACHOL) 40 MG tablet GIVE 1 TAB BY MOUTH ONCE DAILY 30 tablet 11   QUEtiapine (SEROQUEL) 25 MG tablet GIVE 1 TAB BY MOUTH AT BEDTIME 30 tablet 5   vitamin B-12 (CYANOCOBALAMIN) 500 MCG tablet GIVE 1 TAB BY MOUTH ONCE DAILY 30 tablet 11   No current facility-administered medications on file prior to visit.     Review of Systems  Constitutional:  Negative for chills and fever.  HENT:  Negative for congestion and sore throat.   Respiratory:  Negative for cough, shortness of breath and wheezing.   Cardiovascular:  Positive for leg swelling. Negative for chest pain and palpitations.  Genitourinary:  Negative for difficulty urinating, dysuria and hematuria.  Neurological:  Positive for light-headedness (somewhat in the morning). Negative  for headaches.       Objective:   Vitals:   03/05/23 1051  BP: 130/72  Pulse: 82  Temp: 98.1 F (36.7 C)  SpO2: 98%   BP Readings from Last 3 Encounters:  03/05/23 130/72  02/13/23 106/78  01/25/23 (!) 95/50   Wt Readings from Last 3 Encounters:  02/13/23 134 lb (60.8 kg)  01/25/23 140 lb (63.5 kg)  01/16/23 154 lb (69.9 kg)   Body mass index is 23 kg/m.    Physical Exam Constitutional:      General: He is not in acute distress.    Appearance: Normal appearance. He is not ill-appearing.  HENT:     Head: Normocephalic and atraumatic.  Eyes:     Conjunctiva/sclera: Conjunctivae normal.  Cardiovascular:     Rate and Rhythm: Normal rate and regular rhythm.     Heart  sounds: Murmur heard.  Pulmonary:     Effort: Pulmonary effort is normal. No respiratory distress.     Breath sounds: Normal breath sounds. No wheezing or rales.  Abdominal:     General: There is no distension.     Palpations: Abdomen is soft.     Tenderness: There is no abdominal tenderness.  Musculoskeletal:     Right lower leg: No edema.     Left lower leg: No edema.  Skin:    General: Skin is warm and dry.     Findings: No rash.  Neurological:     Mental Status: He is alert. Mental status is at baseline.  Psychiatric:        Mood and Affect: Mood normal.        Lab Results  Component Value Date   WBC 5.5 02/23/2023   HGB 8.5 Repeated and verified X2. (L) 02/23/2023   HCT 26.6 (L) 02/23/2023   PLT 167.0 02/23/2023   GLUCOSE 86 02/25/2023   CHOL 149 12/09/2017   TRIG 57.0 12/09/2017   HDL 72.30 12/09/2017   LDLCALC 65 12/09/2017   ALT 16 02/13/2023   AST 23 02/13/2023   NA 140 02/25/2023   K 4.7 02/25/2023   CL 105 02/25/2023   CREATININE 2.46 (H) 02/25/2023   BUN 48 (H) 02/25/2023   CO2 26 02/25/2023   TSH 2.34 02/19/2021   PSA 0.00 (L) 02/19/2021   INR 1.3 (H) 01/25/2023   HGBA1C 4.9 11/08/2021     Assessment & Plan:    See Problem List for Assessment and Plan of chronic medical problems.

## 2023-03-04 NOTE — Telephone Encounter (Signed)
A representative from [facility] would like an order put in for Aleve liquid gel capsule. She said patient is already taking one capsule twice a day.   She also said patient's son is requesting Pepto Bismol for an upset stomach.   Best callback for [facility] is [phone].

## 2023-03-04 NOTE — Patient Instructions (Signed)
      Blood work was ordered.       Medications changes include :       A referral was ordered and someone will call you to schedule an appointment.     No follow-ups on file.

## 2023-03-04 NOTE — Telephone Encounter (Signed)
Celeste from [facility] would like verbal orders for a urinalysis for the patient. They think he may have a UTI. Best callback for [facility] is [phone].

## 2023-03-04 NOTE — Telephone Encounter (Signed)
Called today and they are not able to take verbals.  Order faxed to [phone] att [facility].  Ladeja Pelham~

## 2023-03-04 NOTE — Telephone Encounter (Signed)
Okay for orders? 

## 2023-03-05 ENCOUNTER — Ambulatory Visit (INDEPENDENT_AMBULATORY_CARE_PROVIDER_SITE_OTHER): Payer: Medicare Other | Admitting: Internal Medicine

## 2023-03-05 ENCOUNTER — Encounter: Payer: Self-pay | Admitting: Internal Medicine

## 2023-03-05 VITALS — BP 130/72 | HR 82 | Temp 98.1°F | Ht 64.0 in

## 2023-03-05 DIAGNOSIS — M109 Gout, unspecified: Secondary | ICD-10-CM | POA: Diagnosis not present

## 2023-03-05 DIAGNOSIS — R350 Frequency of micturition: Secondary | ICD-10-CM | POA: Diagnosis not present

## 2023-03-05 DIAGNOSIS — D649 Anemia, unspecified: Secondary | ICD-10-CM | POA: Diagnosis not present

## 2023-03-05 DIAGNOSIS — R451 Restlessness and agitation: Secondary | ICD-10-CM

## 2023-03-05 DIAGNOSIS — N1831 Chronic kidney disease, stage 3a: Secondary | ICD-10-CM | POA: Diagnosis not present

## 2023-03-05 LAB — COMPREHENSIVE METABOLIC PANEL
ALT: 14 U/L (ref 0–53)
AST: 22 U/L (ref 0–37)
Albumin: 4.4 g/dL (ref 3.5–5.2)
Alkaline Phosphatase: 43 U/L (ref 39–117)
BUN: 28 mg/dL — ABNORMAL HIGH (ref 6–23)
CO2: 26 mEq/L (ref 19–32)
Calcium: 9.9 mg/dL (ref 8.4–10.5)
Chloride: 104 mEq/L (ref 96–112)
Creatinine, Ser: 1.49 mg/dL (ref 0.40–1.50)
GFR: 40.26 mL/min — ABNORMAL LOW (ref >60.00)
Glucose, Bld: 98 mg/dL (ref 70–99)
Potassium: 4.5 mEq/L (ref 3.5–5.1)
Sodium: 140 mEq/L (ref 135–145)
Total Bilirubin: 2.2 mg/dL — ABNORMAL HIGH (ref 0.2–1.2)
Total Protein: 7.3 g/dL (ref 6.0–8.3)

## 2023-03-05 LAB — CBC WITH DIFFERENTIAL/PLATELET
Basophils Absolute: 0.1 10*3/uL (ref 0.0–0.1)
Basophils Relative: 1 % (ref 0.0–3.0)
Eosinophils Absolute: 0.1 10*3/uL (ref 0.0–0.7)
Eosinophils Relative: 1.6 % (ref 0.0–5.0)
HCT: 37.3 % — ABNORMAL LOW (ref 39.0–52.0)
Hemoglobin: 11.8 g/dL — ABNORMAL LOW (ref 13.0–17.0)
Lymphocytes Relative: 11.3 % — ABNORMAL LOW (ref 12.0–46.0)
Lymphs Abs: 0.9 10*3/uL (ref 0.7–4.0)
MCHC: 31.6 g/dL (ref 30.0–36.0)
MCV: 88.6 fl (ref 78.0–100.0)
Monocytes Absolute: 0.6 10*3/uL (ref 0.1–1.0)
Monocytes Relative: 7.5 % (ref 3.0–12.0)
Neutro Abs: 6.2 10*3/uL (ref 1.4–7.7)
Neutrophils Relative %: 78.6 % — ABNORMAL HIGH (ref 43.0–77.0)
Platelets: 194 10*3/uL (ref 150.0–400.0)
RBC: 4.21 Mil/uL — ABNORMAL LOW (ref 4.22–5.81)
RDW: 22.3 % — ABNORMAL HIGH (ref 11.5–15.5)
WBC: 7.8 10*3/uL (ref 4.0–10.5)

## 2023-03-05 LAB — URINALYSIS, ROUTINE W REFLEX MICROSCOPIC
Ketones, ur: NEGATIVE
Nitrite: NEGATIVE
Specific Gravity, Urine: 1.025 (ref 1.000–1.030)
Total Protein, Urine: 30 — AB
Urine Glucose: NEGATIVE
Urobilinogen, UA: 0.2 (ref 0.0–1.0)
pH: 6 (ref 5.0–8.0)

## 2023-03-05 LAB — URIC ACID: Uric Acid, Serum: 4.3 mg/dL (ref 4.0–7.8)

## 2023-03-05 MED ORDER — ACETAMINOPHEN 325 MG PO TABS: 650.0000 mg | ORAL_TABLET | Freq: Two times a day (BID) | ORAL | 11 refills | Status: DC

## 2023-03-05 MED ORDER — LOPERAMIDE HCL 2 MG PO TABS: 2.0000 mg | ORAL_TABLET | Freq: Four times a day (QID) | ORAL | 0 refills | Status: DC | PRN

## 2023-03-05 MED ORDER — FUROSEMIDE 20 MG PO TABS: 20.0000 mg | ORAL_TABLET | Freq: Every day | ORAL | 3 refills | Status: DC

## 2023-03-05 NOTE — Assessment & Plan Note (Addendum)
Chronic-Worse than previous No gross hematuria or other source of bleeding Likely related to CKD, anemia of chronic disease Recheck CBC today Depending on results may need to hold Eliquis He is taking iron Sees hematology early June

## 2023-03-05 NOTE — Assessment & Plan Note (Signed)
Chronic Worsening recently He has been taking NSAIDs which is likely partially to blame-sounds like he is taking Aleve and Advil-advised stopping both of these Start Tylenol twice daily-can take more if needed-up to 3000 mg a day Blood pressure better today after decreasing hydralazine Not fluid overloaded-possibly a little dry-decrease Lasix from 40 mg daily to 20 mg daily Has been referred to nephrology and they will get him in as soon as they are able Doing okay off gabapentin-officially discontinue

## 2023-03-05 NOTE — Telephone Encounter (Signed)
  No for advil.-CKD-cannot take Advil or Aleve anymore.  Okay for Pepto-Bismol-on current med list.

## 2023-03-05 NOTE — Assessment & Plan Note (Signed)
Started having some agitation while hospitalized and was started on Seroquel 25 mg nightly This was stopped for some reason few nights ago which could be the behavior that his son has noticed Will restart Seroquel 25 mg nightly Discussed neurology referral for further evaluation of possible dementia-his son will think about this

## 2023-03-05 NOTE — Assessment & Plan Note (Signed)
Taking allopurinol 300 mg a day Check uric acid level

## 2023-03-05 NOTE — Assessment & Plan Note (Signed)
Chronic Has had some recent confusion Unlikely to be UTI, but will check UA, urine culture to rule out I am concerned there may be some underlying dementia-consider neurology referral-advised family to think about this and restart Seroquel at night

## 2023-03-05 NOTE — Telephone Encounter (Signed)
Son was given updated med list today to take facility.

## 2023-03-06 NOTE — Progress Notes (Signed)
Cardiology Office Note:    Date:  03/17/2023   ID:  Paul Mays, DOB 03-08-1928, MRN [id number]  PCP:  Pincus Sanes, MD  [facility] Providers Cardiologist:  Tonny Bollman, MD     Referring MD: Pincus Sanes, MD   Chief Complaint:  Follow-up     History of Present Illness:   Paul Mays is a 89 y.o. 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 The patient was diagnosed with severe low-flow low gradient aortic stenosis along with moderate aortic insufficiency and he underwent TAVR via a transfemoral approach in February 2023.      Patient saw Dr. Excell Seltzer 10/2022 with fluid overload and new Afib. He was given daily lasix/K.  Diastolic heart failure was felt related to the presence of severe aortic stenosis, senile sigmoid septum, possible amyloid heart disease which we have not aggressively investigated at age 31, and hypertension. Will continue to treat him medically. Echo reviewed by Dr. Excell Seltzer: Aortic bioprosthetic valve appears to be functioning normally on my review of his echo today with a mean transvalvular gradient ranging from 9 to 12 mmHg and no significant paravalvular regurgitation. He was started on apixaban and ASA was stopped.   Patient here with his son. He had urinary bleeding on eliquis and UTI and now he's on 2.5 mg bid. Some dizziness if he gets up quickly. No chest pain, palpitations.Crt up 3.41 02/23/23 but now down 1.47.       Past Medical History:  Diagnosis Date   Anxiety    Aortic stenosis    Arthralgia    Arthritis    CAD (coronary artery disease)    Carotid stenosis    Cervicalgia    CLUSTER HEADACHE 02/10/2007   Qualifier: Diagnosis of  By: Alwyn Ren MD, Chrissie Noa     Colitis    Edema    Gout    Hearing loss    Herpes zoster without mention of complication    Hyperlipidemia    Hypertension    Hyperuricemia    Impacted cerumen    Migraine    Migraine    Murmur    Olecranon  bursitis    OSA (obstructive sleep apnea)    Other and unspecified hyperlipidemia    Other malaise and fatigue    Peripheral neuropathy    Polyneuropathy    Prostate cancer (HCC)    Pulmonary hypertension (HCC)    S/P TAVR (transcatheter aortic valve replacement) 11/12/2021   23mm S3UR via TF approach with Dr. Excell Seltzer and Dr. [name]   Unspecified hereditary and idiopathic peripheral neuropathy    Venous insufficiency    Current Medications: Current Meds  Medication Sig   acetaminophen (TYLENOL) 325 MG tablet Take 2 tablets (650 mg total) by mouth 2 (two) times daily. May take up to 2 Tabs Q 6 hr prn for pain - MDD 3000 mg   allopurinol (ZYLOPRIM) 300 MG tablet TAKE 1 TABLET(300 MG) BY MOUTH DAILY AS NEEDED FOR GOUT FLARE   amoxicillin (AMOXIL) 500 MG tablet GIVE 4 TABS (2000MG ) BY MOUTH 1H BEFORE APPT   ANTI-DIARRHEAL 2 MG tablet GIVE 1 TAB BY MOUTH 4 TIMES DAILY AS NEEDED FOR DIARRHEA OR LOOSE STOOLS   apixaban (ELIQUIS) 2.5 MG TABS tablet GIVE 1 TAB BY MOUTH TWICE DAILY   Bismuth Subsalicylate (PEPTO BISMOL PO) Take by mouth as needed. Prn as needed for upset stomach   Cholecalciferol (VITAMIN D3) 50 MCG (  2000 UT) capsule GIVE 1 CAP BY MOUTH ONCE DAILY   furosemide (LASIX) 20 MG tablet Take 1 tablet (20 mg total) by mouth daily.   iron polysaccharides (NIFEREX) 150 MG capsule Take 1 capsule (150 mg total) by mouth daily.   metoprolol tartrate (LOPRESSOR) 25 MG tablet GIVE 1 TAB BY MOUTH TWICE DAILY   oxybutynin (DITROPAN) 5 MG tablet Take 2 tablets (10 mg total) by mouth every 8 (eight) hours as needed for bladder spasms.   potassium chloride (KLOR-CON M) 10 MEQ tablet Take 1 tablet by mouth daily (with Furosemide)   pravastatin (PRAVACHOL) 40 MG tablet GIVE 1 TAB BY MOUTH ONCE DAILY   QUEtiapine (SEROQUEL) 25 MG tablet GIVE 1 TAB BY MOUTH AT BEDTIME   vitamin B-12 (CYANOCOBALAMIN) 500 MCG tablet GIVE 1 TAB BY MOUTH ONCE DAILY   [DISCONTINUED] hydrALAZINE (APRESOLINE) 10 MG tablet  Take 1 tablet (10 mg total) by mouth 2 (two) times daily.    Allergies:   Lisinopril and Spironolactone   Social History   Tobacco Use   Smoking status: Never   Smokeless tobacco: Never  Vaping Use   Vaping Use: Never used  Substance Use Topics   Alcohol use: Yes    Alcohol/week: 14.0 standard drinks of alcohol    Types: 14 Standard drinks or equivalent per week    Comment: 2 drink before dinner vodka    Drug use: No    Family Hx: The patient's family history includes Diabetes in his brother, father, and sister; Healthy in his daughter and son; High blood pressure in his father; Other in his mother; Stroke in his father.  ROS     Physical Exam:    VS:  BP 100/62   Pulse (!) 43   Ht 5\' 4"  (1.626 m)   Wt 119 lb 12.8 oz (54.3 kg)   SpO2 100%   BMI 20.56 kg/m     Wt Readings from Last 3 Encounters:  03/17/23 119 lb 12.8 oz (54.3 kg)  02/13/23 134 lb (60.8 kg)  01/25/23 140 lb (63.5 kg)    Physical Exam  GEN: Well nourished, well developed, in no acute distress  Neck: no JVD, carotid bruits, or masses Cardiac:RRR; no murmurs, rubs, or gallops  Respiratory:  clear to auscultation bilaterally, normal work of breathing GI: soft, nontender, nondistended, + BS Ext: without cyanosis, clubbing, or edema, Good distal pulses bilaterally Neuro:  Alert and Oriented x 3,  Psych: euthymic mood, full affect        EKGs/Labs/Other Test Reviewed:    EKG:  EKG is   ordered today.  The ekg ordered today demonstrates  A flutter 40/m  Recent Labs: 01/02/2023: B Natriuretic Peptide 2,982.8 01/03/2023: Magnesium 2.2 03/13/2023: ALT 10; BUN 24; Creatinine 1.47; Hemoglobin 10.7; Platelet Count 157; Potassium 4.1; Sodium 141   Recent Lipid Panel No results for input(s): "CHOL", "TRIG", "HDL", "VLDL", "LDLCALC", "LDLDIRECT" in the last 8760 hours.   Prior CV Studies: ECHO COMPLETE WO IMAGING ENHANCING AGENT 10/28/2022  Narrative ECHOCARDIOGRAM REPORT    Patient Name:   Paul Mays Date of Exam: 10/28/2022 Medical Rec #:  [id number]      Height:       66.0 in Accession #:    [id number]     Weight:       139.2 lb Date of Birth:  [birth year]       BSA:          1.714 m Patient Age:    83  years       BP:           114/65 mmHg Patient Gender: M              HR:           50 bpm. Exam Location:  [address]  Procedure: 2D Echo, 3D Echo, Cardiac Doppler and Color Doppler  Indications:    Z95.2 Status Post TAVR  History:        Patient has prior history of Echocardiogram examinations, most recent 12/13/2021. CAD, Pulmonary HTN, Aortic Valve Disease, Signs/Symptoms:Fatigue and Edema; Risk Factors:Hypertension, Dyslipidemia and Sleep Apnea. Aortic Valve: 23 mm Sapien prosthetic, stented (TAVR) valve is present in the aortic position. Procedure Date: 11/12/21.  Sonographer:    Farrel Conners RDCS Referring Phys: Bobette Mo BURNS  IMPRESSIONS   1. Left ventricular ejection fraction, by estimation, is 65 to 70%. Left ventricular ejection fraction by 3D volume is 72 %. The left ventricle has normal function. The left ventricle has no regional wall motion abnormalities. There is severe left ventricular hypertrophy. Left ventricular diastolic parameters are indeterminate. Elevated left ventricular end-diastolic pressure. The E/e' is 38. 2. Right ventricular systolic function is mildly reduced. The right ventricular size is moderately enlarged. There is severely elevated pulmonary artery systolic pressure. The estimated right ventricular systolic pressure is 85.2 mmHg. 3. Left atrial size was severely dilated. 4. Right atrial size was moderately dilated. 5. The mitral valve is degenerative. Moderate to severe mitral valve regurgitation. No evidence of mitral stenosis. The mean mitral valve gradient is 3.8 mmHg with average heart rate of 58 bpm. 6. Tricuspid valve regurgitation is moderate to severe. 7. The aortic valve has been repaired/replaced. Aortic valve regurgitation is  not visualized. No aortic stenosis is present. There is a 23 mm Sapien prosthetic (TAVR) valve present in the aortic position. Procedure Date: 11/12/21. Aortic valve area, by VTI measures (EOA) 2.45 cm, iEOA 1.44 cm2/m2. DVI 0.59. Aortic valve mean gradient measures 11.0 mmHg. Aortic valve Vmax measures 2.21 m/s. 8. The inferior vena cava is dilated in size with <50% respiratory variability, suggesting right atrial pressure of 15 mmHg.  FINDINGS Left Ventricle: Left ventricular ejection fraction, by estimation, is 65 to 70%. Left ventricular ejection fraction by 3D volume is 72 %. The left ventricle has normal function. The left ventricle has no regional wall motion abnormalities. The left ventricular internal cavity size was normal in size. There is severe left ventricular hypertrophy. Left ventricular diastolic parameters are indeterminate. Elevated left ventricular end-diastolic pressure. The E/e' is 24.  Right Ventricle: The right ventricular size is moderately enlarged. No increase in right ventricular wall thickness. Right ventricular systolic function is mildly reduced. There is severely elevated pulmonary artery systolic pressure. The tricuspid regurgitant velocity is 4.19 m/s, and with an assumed right atrial pressure of 15 mmHg, the estimated right ventricular systolic pressure is 85.2 mmHg.  Left Atrium: Left atrial size was severely dilated.  Right Atrium: Right atrial size was moderately dilated.  Pericardium: There is no evidence of pericardial effusion.  Mitral Valve: The mitral valve is degenerative in appearance. Mild to moderate mitral annular calcification. Moderate to severe mitral valve regurgitation. No evidence of mitral valve stenosis. The mean mitral valve gradient is 3.8 mmHg with average heart rate of 58 bpm.  Tricuspid Valve: The tricuspid valve is normal in structure. Tricuspid valve regurgitation is moderate to severe. No evidence of tricuspid stenosis.  Aortic  Valve: The aortic valve has been repaired/replaced. Aortic  valve regurgitation is not visualized. No aortic stenosis is present. Aortic valve mean gradient measures 11.0 mmHg. Aortic valve peak gradient measures 19.5 mmHg. Aortic valve area, by VTI measures 2.45 cm. There is a 23 mm Sapien prosthetic, stented (TAVR) valve present in the aortic position. Procedure Date: 11/12/21.  Pulmonic Valve: The pulmonic valve was normal in structure. Pulmonic valve regurgitation is mild. No evidence of pulmonic stenosis.  Aorta: The aortic root is normal in size and structure.  Venous: The inferior vena cava is dilated in size with less than 50% respiratory variability, suggesting right atrial pressure of 15 mmHg.  IAS/Shunts: No atrial level shunt detected by color flow Doppler.   LEFT VENTRICLE PLAX 2D LVIDd:         4.60 cm         Diastology LVIDs:         2.60 cm         LV e' medial:    4.67 cm/s LV PW:         1.20 cm         LV E/e' medial:  38.3 LV IVS:        1.40 cm         LV e' lateral:   9.28 cm/s LVOT diam:     2.30 cm         LV E/e' lateral: 19.3 LV SV:         99 LV SV Index:   58 LVOT Area:     4.15 cm        3D Volume EF LV 3D EF:    Left ventricul ar ejection fraction by 3D volume is 72 %.  3D Volume EF: 3D EF:        72 % LV EDV:       142 ml LV ESV:       39 ml LV SV:        102 ml  RIGHT VENTRICLE RV Basal diam:  4.70 cm RV S prime:     8.49 cm/s TAPSE (M-mode): 1.7 cm  LEFT ATRIUM              Index         RIGHT ATRIUM           Index LA diam:        6.10 cm  3.56 cm/m    RA Area:     28.50 cm LA Vol (A2C):   212.0 ml 123.66 ml/m  RA Volume:   101.00 ml 58.91 ml/m LA Vol (A4C):   191.0 ml 111.41 ml/m LA Biplane Vol: 208.0 ml 121.33 ml/m AORTIC VALVE AV Area (Vmax):    2.30 cm AV Area (Vmean):   2.47 cm AV Area (VTI):     2.45 cm AV Vmax:           221.00 cm/s AV Vmean:          141.200 cm/s AV VTI:            0.404 m AV Peak Grad:      19.5  mmHg AV Mean Grad:      11.0 mmHg LVOT Vmax:         122.33 cm/s LVOT Vmean:        83.900 cm/s LVOT VTI:          0.238 m LVOT/AV VTI ratio: 0.59  AORTA Ao Root diam: 3.00 cm Ao Asc diam:  3.50 cm  MITRAL  VALVE                TRICUSPID VALVE MV Area (PHT): 5.10 cm     TR Peak grad:   70.2 mmHg MV Mean grad:  3.8 mmHg     TR Vmax:        419.00 cm/s MV Decel Time: 149 msec MR Peak grad: 114.5 mmHg    SHUNTS MR Mean grad: 73.5 mmHg     Systemic VTI:  0.24 m MR Vmax:      535.00 cm/s   Systemic Diam: 2.30 cm MR Vmean:     407.0 cm/s MV E velocity: 178.75 cm/s MV A velocity: 51.20 cm/s MV E/A ratio:  3.49  Weston Brass MD Electronically signed by Weston Brass MD Signature Date/Time: 10/28/2022/2:51:43 PM    Final        Risk Assessment/Calculations/Metrics:    CHA2DS2-VASc Score = 5   This indicates a 7.2% annual risk of stroke. The patient's score is based upon: CHF History: 1 HTN History: 1 Diabetes History: 0 Stroke History: 0 Vascular Disease History: 1 Age Score: 2 Gender Score: 0             ASSESSMENT & PLAN:   No problem-specific Assessment & Plan notes found for this encounter.   Chronic diastolic CHF-appears on dry side. Crt up 3.41 02/23/23 and lasix held for a week and then restarted at lower dose 20 mg daily. Repeat 03/13/23 Crt 1.47. BP low and some dizziness. Will make lasix & potassium prn.  S/P TAVR for severe AS Aortic bioprosthetic valve appears to be functioning normally on my review of his echo today with a mean transvalvular gradient ranging from 9 to 12 mmHg and no significant paravalvular regurgitation  PAF -in A flutter 40/m on eliquis 2.5 mg appropriate dose based on age/weight less than 60 kg. On metoprolol 25 mg bid and with HR 40 will decrease metoprolol 12.5 mg bid. Bring back in a couple weeks to reevaluate  Hypotension-will change lasix to prn and reduce metoprolol 12.5 mg bid. Stop hydralazine  CAD-no chest  pain  Possible amyloid heart disease           Dispo:  No follow-ups on file.   Medication Adjustments/Labs and Tests Ordered: Current medicines are reviewed at length with the patient today.  Concerns regarding medicines are outlined above.  Tests Ordered: No orders of the defined types were placed in this encounter.  Medication Changes: No orders of the defined types were placed in this encounter.  Signed, Jacolyn Reedy, PA-C  03/17/2023 1:20 PM    [facility] [address] [place], [place], [place]  [address] Phone: [phone]; Fax: [phone]

## 2023-03-06 NOTE — Telephone Encounter (Signed)
Paul Mays from [facility] said they did not receive the fax for the orders. She would like to know if it can be re-faxed and marked as 'attention:Paul Mays' Best callback is [phone].

## 2023-03-07 DIAGNOSIS — R300 Dysuria: Secondary | ICD-10-CM | POA: Diagnosis not present

## 2023-03-07 LAB — URINE CULTURE: Result:: NO GROWTH

## 2023-03-09 ENCOUNTER — Telehealth: Payer: Self-pay | Admitting: Internal Medicine

## 2023-03-09 DIAGNOSIS — Z952 Presence of prosthetic heart valve: Secondary | ICD-10-CM

## 2023-03-09 DIAGNOSIS — R600 Localized edema: Secondary | ICD-10-CM

## 2023-03-09 DIAGNOSIS — Z79899 Other long term (current) drug therapy: Secondary | ICD-10-CM

## 2023-03-09 NOTE — Telephone Encounter (Signed)
Prescription Request  03/09/2023  LOV: 03/05/2023  What is the name of the medication or equipment?  potassium chloride (KLOR-CON M) 10 MEQ tablet   Have you contacted your pharmacy to request a refill? No  [name] of [id number] [phone] Which pharmacy would you like this sent to?    Patient notified that their request is being sent to the clinical staff for review and that they should receive a response within 2 business days.   Please advise at Mobile [phone] (mobile)

## 2023-03-09 NOTE — Telephone Encounter (Signed)
Last refill was w/ Dr Tonny Bollman. Is this ok.Marland KitchenRaechel Chute

## 2023-03-10 ENCOUNTER — Other Ambulatory Visit: Payer: Self-pay | Admitting: Internal Medicine

## 2023-03-10 MED ORDER — POTASSIUM CHLORIDE CRYS ER 10 MEQ PO TBCR
EXTENDED_RELEASE_TABLET | ORAL | 3 refills | Status: DC
Start: 2023-03-10 — End: 2023-03-17

## 2023-03-12 ENCOUNTER — Telehealth: Payer: Self-pay

## 2023-03-12 DIAGNOSIS — R296 Repeated falls: Secondary | ICD-10-CM | POA: Diagnosis not present

## 2023-03-12 DIAGNOSIS — R2689 Other abnormalities of gait and mobility: Secondary | ICD-10-CM | POA: Diagnosis not present

## 2023-03-12 DIAGNOSIS — R5381 Other malaise: Secondary | ICD-10-CM

## 2023-03-12 DIAGNOSIS — R2681 Unsteadiness on feet: Secondary | ICD-10-CM | POA: Diagnosis not present

## 2023-03-13 ENCOUNTER — Other Ambulatory Visit: Payer: Self-pay

## 2023-03-13 ENCOUNTER — Inpatient Hospital Stay: Payer: Medicare Other | Attending: Hematology

## 2023-03-13 ENCOUNTER — Inpatient Hospital Stay (HOSPITAL_BASED_OUTPATIENT_CLINIC_OR_DEPARTMENT_OTHER): Payer: Medicare Other | Admitting: Hematology

## 2023-03-13 DIAGNOSIS — N189 Chronic kidney disease, unspecified: Secondary | ICD-10-CM | POA: Insufficient documentation

## 2023-03-13 DIAGNOSIS — R2689 Other abnormalities of gait and mobility: Secondary | ICD-10-CM | POA: Diagnosis not present

## 2023-03-13 DIAGNOSIS — E611 Iron deficiency: Secondary | ICD-10-CM | POA: Insufficient documentation

## 2023-03-13 DIAGNOSIS — R296 Repeated falls: Secondary | ICD-10-CM | POA: Diagnosis not present

## 2023-03-13 DIAGNOSIS — D649 Anemia, unspecified: Secondary | ICD-10-CM

## 2023-03-13 DIAGNOSIS — R2681 Unsteadiness on feet: Secondary | ICD-10-CM | POA: Diagnosis not present

## 2023-03-13 DIAGNOSIS — D696 Thrombocytopenia, unspecified: Secondary | ICD-10-CM | POA: Insufficient documentation

## 2023-03-13 LAB — FERRITIN: Ferritin: 33 ng/mL (ref 24–336)

## 2023-03-13 LAB — CMP (CANCER CENTER ONLY)
ALT: 10 U/L (ref 0–44)
AST: 16 U/L (ref 15–41)
Albumin: 4 g/dL (ref 3.5–5.0)
Alkaline Phosphatase: 38 U/L (ref 38–126)
Anion gap: 5 (ref 5–15)
BUN: 24 mg/dL — ABNORMAL HIGH (ref 8–23)
CO2: 31 mmol/L (ref 22–32)
Calcium: 9.5 mg/dL (ref 8.9–10.3)
Chloride: 105 mmol/L (ref 98–111)
Creatinine: 1.47 mg/dL — ABNORMAL HIGH (ref 0.61–1.24)
GFR, Estimated: 44 mL/min — ABNORMAL LOW (ref >60)
Glucose, Bld: 115 mg/dL — ABNORMAL HIGH (ref 70–99)
Potassium: 4.1 mmol/L (ref 3.5–5.1)
Sodium: 141 mmol/L (ref 135–145)
Total Bilirubin: 1.3 mg/dL — ABNORMAL HIGH (ref 0.3–1.2)
Total Protein: 6.7 g/dL (ref 6.5–8.1)

## 2023-03-13 LAB — CBC WITH DIFFERENTIAL (CANCER CENTER ONLY)
Abs Immature Granulocytes: 0.02 10*3/uL (ref 0.00–0.07)
Basophils Absolute: 0.1 10*3/uL (ref 0.0–0.1)
Basophils Relative: 1 %
Eosinophils Absolute: 0.2 10*3/uL (ref 0.0–0.5)
Eosinophils Relative: 3 %
HCT: 34.3 % — ABNORMAL LOW (ref 39.0–52.0)
Hemoglobin: 10.7 g/dL — ABNORMAL LOW (ref 13.0–17.0)
Immature Granulocytes: 0 %
Lymphocytes Relative: 14 %
Lymphs Abs: 0.7 10*3/uL (ref 0.7–4.0)
MCH: 28.3 pg (ref 26.0–34.0)
MCHC: 31.2 g/dL (ref 30.0–36.0)
MCV: 90.7 fL (ref 80.0–100.0)
Monocytes Absolute: 0.5 10*3/uL (ref 0.1–1.0)
Monocytes Relative: 9 %
Neutro Abs: 3.9 10*3/uL (ref 1.7–7.7)
Neutrophils Relative %: 73 %
Platelet Count: 157 10*3/uL (ref 150–400)
RBC: 3.78 MIL/uL — ABNORMAL LOW (ref 4.22–5.81)
RDW: 19.2 % — ABNORMAL HIGH (ref 11.5–15.5)
WBC Count: 5.3 10*3/uL (ref 4.0–10.5)
nRBC: 0 % (ref 0.0–0.2)

## 2023-03-13 LAB — IRON AND IRON BINDING CAPACITY (CC-WL,HP ONLY)
Iron: 53 ug/dL (ref 45–182)
Saturation Ratios: 14 % — ABNORMAL LOW (ref 17.9–39.5)
TIBC: 389 ug/dL (ref 250–450)
UIBC: 336 ug/dL (ref 117–376)

## 2023-03-13 LAB — VITAMIN B12: Vitamin B-12: 1318 pg/mL — ABNORMAL HIGH (ref 180–914)

## 2023-03-13 NOTE — Progress Notes (Signed)
HEMATOLOGY/ONCOLOGY CLINIC NOTE  Date of Service: 03/13/23   Patient Care Team: Pincus Sanes, MD as PCP - General (Internal Medicine) Tonny Bollman, MD as PCP - Cardiology (Cardiology) Kathyrn Sheriff, [facility] as Pharmacist (Pharmacist) Antony Contras, MD as Consulting Physician (Ophthalmology)  CHIEF COMPLAINTS/PURPOSE OF CONSULTATION:  Bicytopenia   HISTORY OF PRESENTING ILLNESS:   Paul Mays is a wonderful 89 y.o. male who has been referred to [name] by Dr. Cheryll Cockayne for evaluation and management of Bicytopenia.  He was previously seen by me on 04/22/2018 for Thrombocytopenia. Platelets had been low since at least 2012. During that time, his low platelet count were not worrisome.   Patient is here with his son during today's visit. He is doing well since our last visit. He is currently taking Gabapentin 600 mg and Allopurinol 300 mg.   He notes he had an Gout flare up last year, and is taking Allopurinol for it. He is not sure about what causes his gouts to flare up. His son notes that the patient does not eat red meat as much as he used to.   Patient was previously taking Vitamin B-3, Vitamin B-12, and Vitamin-D supplements.   He denies loss of appetite, but he notes he has not been eating as good as he used to. He lost around 40 lbs in the past year.   He denies abnormal bowel moments, black stools, Hematochezia, abdominal pain, back pain, fever, chills, and leg swelling.   He notes he used to take Aspirin, but discontinued and started tylenol. Patient thought tylenol was treating for the same thing as the Aspirin.   He complains of fatigue while walking and has not seen his Cardiologist or PCP for this issue.   Interval History:   Paul Mays is a wonderful 89 y.o. male who is here for continued evaluation and management of Bicytopenia. He was last seen by me on 09/01/2022 and reported mild hemorrhoidal bleeding.   Patient presented to the ED on 01/25/2023  for laceration of left forearm after a fall.   Today, patient is accompanied by his son and is presenting in a wheelchair. He reports feeling well overall over the past 2-3 weeks. Son notes patient is improving since last ED visit, particularly his eating habits. Patient is currently in assisted living with his wife and is planning to engage in physical therapy soon. Patient's son notes significant recent weight loss.   Son reports that patient was recently seen by urologist and cystoscopy revealed normal findings. Patient has been on blood thinners since November, 2023 and has been told that his bleeding was likely due to Eliquis and there were no findings of lesions. Patient denies any other bleeding issues such as gum bleeding, epistaxis, blood in stools, or blood in urine while on Eliquis.   Patient's son reports low energy levels, but is energized during his visit. His son reports that patient was not eating for a few days and was confused, though this has improved.  Patient has been on 25 MG Seroquel for a while and denies any new hallucinations. He also denies any issues with bowel habits, lightheadedness, dizziness, no blood in urine stools, nosebleeds, or any other bleeding issues from blood thinners, denies abdominal pain. Patient continues to take Eliquis.   Patient's son reports that patient primarily endorsed blood loss from his urine rather than laceration with his fall.   Patient has discontinued Gabapentin due to balance issues and his Lasix has been  reduced from 40 to 20 MG. Patient continues to take oral polysaccharide.   MEDICAL HISTORY:  Past Medical History:  Diagnosis Date   Anxiety    Aortic stenosis    Arthralgia    Arthritis    CAD (coronary artery disease)    Carotid stenosis    Cervicalgia    CLUSTER HEADACHE 02/10/2007   Qualifier: Diagnosis of  By: Alwyn Ren MD, Chrissie Noa     Colitis    Edema    Gout    Hearing loss    Herpes zoster without mention of  complication    Hyperlipidemia    Hypertension    Hyperuricemia    Impacted cerumen    Migraine    Migraine    Murmur    Olecranon bursitis    OSA (obstructive sleep apnea)    Other and unspecified hyperlipidemia    Other malaise and fatigue    Peripheral neuropathy    Polyneuropathy    Prostate cancer (HCC)    Pulmonary hypertension (HCC)    S/P TAVR (transcatheter aortic valve replacement) 11/12/2021   23mm S3UR via TF approach with Dr. Excell Seltzer and Dr. [name]   Unspecified hereditary and idiopathic peripheral neuropathy    Venous insufficiency     SURGICAL HISTORY: Past Surgical History:  Procedure Laterality Date   ANGIOPLASTY     w/ 2 stents in 1999   APPENDECTOMY     CATARACT EXTRACTION W/ INTRAOCULAR LENS IMPLANT Bilateral    HEMORRHOID SURGERY     INTRAOPERATIVE TRANSTHORACIC ECHOCARDIOGRAM N/A 11/12/2021   Procedure: INTRAOPERATIVE TRANSTHORACIC ECHOCARDIOGRAM;  Surgeon: Tonny Bollman, MD;  Location: [facility] OR;  Service: Open Heart Surgery;  Laterality: N/A;   LIPOMA EXCISION     RIGHT/LEFT HEART CATH AND CORONARY ANGIOGRAPHY N/A 10/02/2021   Procedure: RIGHT/LEFT HEART CATH AND CORONARY ANGIOGRAPHY;  Surgeon: Tonny Bollman, MD;  Location: [facility] INVASIVE CV LAB;  Service: Cardiovascular;  Laterality: N/A;   TONSILLECTOMY     TRANSCATHETER AORTIC VALVE REPLACEMENT, TRANSFEMORAL N/A 11/12/2021   Procedure: TRANSCATHETER AORTIC VALVE REPLACEMENT, TRANSFEMORAL;  Surgeon: Tonny Bollman, MD;  Location: [facility] OR;  Service: Open Heart Surgery;  Laterality: N/A;   ULTRASOUND GUIDANCE FOR VASCULAR ACCESS Bilateral 11/12/2021   Procedure: ULTRASOUND GUIDANCE FOR VASCULAR ACCESS;  Surgeon: Tonny Bollman, MD;  Location: [facility] OR;  Service: Open Heart Surgery;  Laterality: Bilateral;    SOCIAL HISTORY: Social History   Socioeconomic History   Marital status: Married    Spouse name: [name]   Number of children: 2   Years of education: college   Highest education level: Not on file   Occupational History   Occupation: retired    Comment: worked as a Designer, multimedia: RETIRED  Tobacco Use   Smoking status: Never   Smokeless tobacco: Never  Vaping Use   Vaping Use: Never used  Substance and Sexual Activity   Alcohol use: Yes    Alcohol/week: 14.0 standard drinks of alcohol    Types: 14 Standard drinks or equivalent per week    Comment: 2 drink before dinner vodka    Drug use: No   Sexual activity: Never  Other Topics Concern   Not on file  Social History Narrative   Pt is married and lives with wife (suzanne). Patient finished  two years of college.    Pt has children.   Caffeine -two cups daily.   Left handed.   Retired Investment banker, corporate  Social Determinants of Health   Financial Resource Strain: Low Risk  (01/12/2023)   Overall Financial Resource Strain (CARDIA)    Difficulty of Paying Living Expenses: Not hard at all  Food Insecurity: No Food Insecurity (01/05/2023)   Hunger Vital Sign    Worried About Running Out of Food in the Last Year: Never true    Ran Out of Food in the Last Year: Never true  Transportation Needs: No Transportation Needs (01/12/2023)   PRAPARE - Administrator, Civil Service (Medical): No    Lack of Transportation (Non-Medical): No  Physical Activity: Sufficiently Active (01/29/2022)   Exercise Vital Sign    Days of Exercise per Week: 5 days    Minutes of Exercise per Session: 30 min  Stress: No Stress Concern Present (01/29/2022)   [employer] of Occupational Health - Occupational Stress Questionnaire    Feeling of Stress : Not at all  Social Connections: Socially Integrated (01/29/2022)   Social Connection and Isolation Panel [NHANES]    Frequency of Communication with Friends and Family: More than three times a week    Frequency of Social Gatherings with Friends and Family: More than three times a week    Attends Religious Services: More than 4 times per year    Active  Member of [employer] or Organizations: Yes    Attends Engineer, structural: More than 4 times per year    Marital Status: Married  Catering manager Violence: Not At Risk (12/28/2022)   Humiliation, Afraid, Rape, and Kick questionnaire    Fear of Current or Ex-Partner: No    Emotionally Abused: No    Physically Abused: No    Sexually Abused: No    FAMILY HISTORY: Family History  Problem Relation Age of Onset   Stroke Father        Died, 59   High blood pressure Father    Diabetes Father    Other Mother        Died, 71   Diabetes Brother        Died, 39   Diabetes Sister        Living, 88   Healthy Daughter    Healthy Son     ALLERGIES:  is allergic to lisinopril and spironolactone.  MEDICATIONS:  Current Outpatient Medications  Medication Sig Dispense Refill   acetaminophen (TYLENOL) 325 MG tablet Take 2 tablets (650 mg total) by mouth 2 (two) times daily. May take up to 2 Tabs Q 6 hr prn for pain - MDD 3000 mg 240 tablet 11   allopurinol (ZYLOPRIM) 300 MG tablet TAKE 1 TABLET(300 MG) BY MOUTH DAILY AS NEEDED FOR GOUT FLARE 90 tablet 0   amoxicillin (AMOXIL) 500 MG tablet GIVE 4 TABS (2000MG ) BY MOUTH 1H BEFORE APPT 4 tablet 2   ANTI-DIARRHEAL 2 MG tablet GIVE 1 TAB BY MOUTH 4 TIMES DAILY AS NEEDED FOR DIARRHEA OR LOOSE STOOLS 30 tablet 11   apixaban (ELIQUIS) 2.5 MG TABS tablet GIVE 1 TAB BY MOUTH TWICE DAILY 60 tablet 5   Bismuth Subsalicylate (PEPTO BISMOL PO) Take by mouth as needed. Prn as needed for upset stomach     Cholecalciferol (VITAMIN D3) 50 MCG (2000 UT) capsule GIVE 1 CAP BY MOUTH ONCE DAILY 30 capsule 11   furosemide (LASIX) 20 MG tablet Take 1 tablet (20 mg total) by mouth daily. 90 tablet 3   hydrALAZINE (APRESOLINE) 10 MG tablet Take 1 tablet (10 mg total) by mouth 2 (two) times daily.  180 tablet 3   iron polysaccharides (NIFEREX) 150 MG capsule Take 1 capsule (150 mg total) by mouth daily. 30 capsule 5   metoprolol tartrate (LOPRESSOR) 25 MG tablet  GIVE 1 TAB BY MOUTH TWICE DAILY 60 tablet 11   oxybutynin (DITROPAN) 5 MG tablet Take 2 tablets (10 mg total) by mouth every 8 (eight) hours as needed for bladder spasms. 20 tablet 0   potassium chloride (KLOR-CON M) 10 MEQ tablet Take 1 tablet by mouth daily (with Furosemide) 90 tablet 3   pravastatin (PRAVACHOL) 40 MG tablet GIVE 1 TAB BY MOUTH ONCE DAILY 30 tablet 11   QUEtiapine (SEROQUEL) 25 MG tablet GIVE 1 TAB BY MOUTH AT BEDTIME 30 tablet 5   vitamin B-12 (CYANOCOBALAMIN) 500 MCG tablet GIVE 1 TAB BY MOUTH ONCE DAILY 30 tablet 11   No current facility-administered medications for this visit.    REVIEW OF SYSTEMS:    10 Point review of Systems was done is negative except as noted above.  PHYSICAL EXAMINATION: GENERAL:alert, in no acute distress and comfortable SKIN: no acute rashes, no significant lesions EYES: conjunctiva are pink and non-injected, sclera anicteric OROPHARYNX: MMM, no exudates, no oropharyngeal erythema or ulceration NECK: supple, no JVD LYMPH:  no palpable lymphadenopathy in the cervical, axillary or inguinal regions LUNGS: clear to auscultation b/l with normal respiratory effort HEART: regular rate & rhythm ABDOMEN:  normoactive bowel sounds , non tender, not distended. Extremity: no pedal edema PSYCH: alert & oriented x 3 with fluent speech NEURO: no focal motor/sensory deficits  LABORATORY DATA:  I have reviewed the data as listed.    Latest Ref Rng & Units 03/13/2023    2:24 PM 03/05/2023   11:58 AM 02/23/2023   12:05 PM  CBC  WBC 4.0 - 10.5 K/uL 5.3  7.8  5.5   Hemoglobin 13.0 - 17.0 g/dL 16.1  09.6  8.5 Repeated and verified X2.   Hematocrit 39.0 - 52.0 % 34.3  37.3  26.6   Platelets 150 - 400 K/uL 157  194.0  167.0    .CBC    Component Value Date/Time   WBC 5.3 03/13/2023 1424   WBC 7.8 03/05/2023 1158   RBC 3.78 (L) 03/13/2023 1424   HGB 10.7 (L) 03/13/2023 1424   HGB 12.5 (L) 09/12/2021 1123   HCT 34.3 (L) 03/13/2023 1424   HCT 36.9 (L)  08/18/2022 1536   PLT 157 03/13/2023 1424   PLT 120 (L) 09/12/2021 1123   MCV 90.7 03/13/2023 1424   MCV 93 09/12/2021 1123   MCH 28.3 03/13/2023 1424   MCHC 31.2 03/13/2023 1424   RDW 19.2 (H) 03/13/2023 1424   RDW 15.3 09/12/2021 1123   LYMPHSABS 0.7 03/13/2023 1424   MONOABS 0.5 03/13/2023 1424   EOSABS 0.2 03/13/2023 1424   BASOSABS 0.1 03/13/2023 1424    .    Latest Ref Rng & Units 03/13/2023    2:24 PM 03/05/2023   11:58 AM 02/25/2023   11:45 AM  CMP  Glucose 70 - 99 mg/dL 045  98  86   BUN 8 - 23 mg/dL 24  28  48   Creatinine 0.61 - 1.24 mg/dL 4.09  8.11  9.14   Sodium 135 - 145 mmol/L 141  140  140   Potassium 3.5 - 5.1 mmol/L 4.1  4.5  4.7   Chloride 98 - 111 mmol/L 105  104  105   CO2 22 - 32 mmol/L 31  26  26    Calcium  8.9 - 10.3 mg/dL 9.5  9.9  9.2   Total Protein 6.5 - 8.1 g/dL 6.7  7.3    Total Bilirubin 0.3 - 1.2 mg/dL 1.3  2.2    Alkaline Phos 38 - 126 U/L 38  43    AST 15 - 41 U/L 16  22    ALT 0 - 44 U/L 10  14     . Lab Results  Component Value Date   IRON 53 03/13/2023   TIBC 389 03/13/2023   IRONPCTSAT 14 (L) 03/13/2023   (Iron and TIBC)  Lab Results  Component Value Date   FERRITIN 33 03/13/2023   Component     Latest Ref Rng 08/18/2022  IgG (Immunoglobin G), Serum     603 - 1,613 mg/dL 454   IgA     61 - 098 mg/dL 95   IgM (Immunoglobulin M), Srm     15 - 143 mg/dL 39   Total Protein ELP     6.0 - 8.5 g/dL 6.8 (C)  Albumin SerPl Elph-Mcnc     2.9 - 4.4 g/dL 4.2 (C)  Alpha 1     0.0 - 0.4 g/dL 0.3 (C)  Alpha2 Glob SerPl Elph-Mcnc     0.4 - 1.0 g/dL 0.6 (C)  B-Globulin SerPl Elph-Mcnc     0.7 - 1.3 g/dL 0.9 (C)  Gamma Glob SerPl Elph-Mcnc     0.4 - 1.8 g/dL 0.8 (C)  M Protein SerPl Elph-Mcnc     Not Observed g/dL Not Observed (C)  Globulin, Total     2.2 - 3.9 g/dL 2.6 (C)  Albumin/Glob SerPl     0.7 - 1.7  1.7 (C)  IFE 1 Comment (C)  Please Note (HCV): Comment (C)  Retic Ct Pct     0.4 - 3.1 % 0.8   RBC.     4.22 - 5.81  MIL/uL 4.04 (L)   Retic Count, Absolute     19.0 - 186.0 K/uL 33.9   Immature Retic Fract     2.3 - 15.9 % 10.2   Folate, Hemolysate     Not Estab. ng/mL 382.0   HCT     37.5 - 51.0 % 36.9 (L)   Folate, RBC     >498 ng/mL 1,035   Haptoglobin     38 - 329 mg/dL 80   LDH     98 - 119 U/L 229 (H)   Immature Platelet Fraction     1.2 - 8.6 % 8.4   Copper     69 - 132 ug/dL 147   Vitamin W29     562 - 914 pg/mL 461     Legend: (L) Low (H) High (C) Corrected  RADIOGRAPHIC STUDIES: I have personally reviewed the radiological images as listed and agreed with the findings in the report. No results found.  ASSESSMENT & PLAN:   89 y.o. male with:  1) Normocytic Anemia- mild. Hgb has improved from 10.3 to 11.3 He has an element of iron deficiency likely from hemorrhoidal bleeding. Also with some CKD Cannot r/o low grade MDS in the context of patients age. No evidence of hemolysis. Copper WNL  2) Thrombocytopenia- mild plt improved to 143k  ?MDS vs vitamin deficiency  PLAN: -Discussed lab results from today, 03/13/2023, in detail. CBC showed WBC 5.3K, hemoglobin 10.7, and platelets 157K. -hemoglobin previously 11.8 on 03/05/2023, patient endorsed bleeding following fall with laceration and blood in the urine. His hemoglobin on 02/23/2023 was low at 8.5.  Pt's hemoglobin baseline is high 10s-low 11s -Mild anemia -Informed patient that iron deficiency and CKD may have caused anemia -Informed patient on age-related markers for CKD, particularly Creatinine -educated patient on how kidney function is monitored -iron labs pending -Informed patient that restoring iron storage may take time due to iron deficiency and discussed other options to increase iron storage such as IV weekly for 2-3 weeks or continue orally -continue Iron polysaccharide 150 mg po daily.  -discussed risk of allergic reactions with IV iron which would be prevented with pre-medications -Patient would like to  proceed with IV iron if needed -Answered all of patient's questions in detail concerning iron treatments -Patient is currently taking Seroquel 25 MG for hallucinations by Dr. Lawerance Bach -Patient most recently visited PCP last week  -educated patient on risks of differing treatments  -Lasix dosage reduced from 40MG  to 20MG  due to low blood pressure, which may have contributed to dizziness by Dr. Lawerance Bach -discussed option of erythropoietin injections to stimulate the bone marrow to produce more cells -Discussed the option of drawing blood and doing labs at assisted living or during PCP visits -Follow-up prn, or if iron levels drop, or if IV iron is required -continue B-Complex and continue vitamin-D supplements.   FOLLOW UP: RTC with Dr Candise Che as needed  The total time spent in the appointment was 25 minutes* .  All of the patient's questions were answered with apparent satisfaction. The patient knows to call the clinic with any problems, questions or concerns.   Wyvonnia Lora MD MS AAHIVMS [facility] Hematology/Oncology Physician [facility]  .*Total Encounter Time as defined by the Centers for Medicare and Medicaid Services includes, in addition to the face-to-face time of a patient visit (documented in the note above) non-face-to-face time: obtaining and reviewing outside history, ordering and reviewing medications, tests or procedures, care coordination (communications with other health care professionals or caregivers) and documentation in the medical record.   I,Mitra Faeizi,acting as a Neurosurgeon for Wyvonnia Lora, MD.,have documented all relevant documentation on the behalf of Wyvonnia Lora, MD,as directed by  Wyvonnia Lora, MD while in the presence of Wyvonnia Lora, MD.  .I have reviewed the above documentation for accuracy and completeness, and I agree with the above. Johney Maine MD

## 2023-03-13 NOTE — Telephone Encounter (Signed)
Victorino Dike with [facility] called to follow up that patient would need an OT referral sent to them. States she can be reached via cell at [phone] and referral can be sent to her email at [email or web address]

## 2023-03-14 NOTE — Telephone Encounter (Signed)
OT ordered.

## 2023-03-16 DIAGNOSIS — R2681 Unsteadiness on feet: Secondary | ICD-10-CM | POA: Diagnosis not present

## 2023-03-16 DIAGNOSIS — R2689 Other abnormalities of gait and mobility: Secondary | ICD-10-CM | POA: Diagnosis not present

## 2023-03-16 DIAGNOSIS — R296 Repeated falls: Secondary | ICD-10-CM | POA: Diagnosis not present

## 2023-03-16 NOTE — Telephone Encounter (Signed)
Order emailed this morning

## 2023-03-17 ENCOUNTER — Encounter: Payer: Self-pay | Admitting: Physician Assistant

## 2023-03-17 ENCOUNTER — Ambulatory Visit: Payer: Medicare Other | Attending: Physician Assistant | Admitting: Physician Assistant

## 2023-03-17 VITALS — BP 100/62 | HR 43 | Ht 64.0 in | Wt 119.8 lb

## 2023-03-17 DIAGNOSIS — Z9861 Coronary angioplasty status: Secondary | ICD-10-CM | POA: Diagnosis not present

## 2023-03-17 DIAGNOSIS — Z79899 Other long term (current) drug therapy: Secondary | ICD-10-CM | POA: Diagnosis not present

## 2023-03-17 DIAGNOSIS — Z952 Presence of prosthetic heart valve: Secondary | ICD-10-CM | POA: Diagnosis not present

## 2023-03-17 DIAGNOSIS — I5032 Chronic diastolic (congestive) heart failure: Secondary | ICD-10-CM | POA: Diagnosis not present

## 2023-03-17 DIAGNOSIS — I4819 Other persistent atrial fibrillation: Secondary | ICD-10-CM | POA: Insufficient documentation

## 2023-03-17 DIAGNOSIS — I2510 Atherosclerotic heart disease of native coronary artery without angina pectoris: Secondary | ICD-10-CM | POA: Insufficient documentation

## 2023-03-17 DIAGNOSIS — R600 Localized edema: Secondary | ICD-10-CM | POA: Insufficient documentation

## 2023-03-17 MED ORDER — FUROSEMIDE 20 MG PO TABS: 20.0000 mg | ORAL_TABLET | Freq: Every day | ORAL | 3 refills | Status: DC | PRN

## 2023-03-17 MED ORDER — POTASSIUM CHLORIDE CRYS ER 10 MEQ PO TBCR
10.0000 meq | EXTENDED_RELEASE_TABLET | Freq: Every day | ORAL | 3 refills | Status: AC | PRN
Start: 2023-03-17 — End: ?

## 2023-03-17 MED ORDER — METOPROLOL TARTRATE 25 MG PO TABS: 12.5000 mg | ORAL_TABLET | Freq: Two times a day (BID) | ORAL | 11 refills | Status: DC

## 2023-03-17 NOTE — Patient Instructions (Addendum)
Medication Instructions:   START TAKING: TOPROL 12.5 MG TWICE A DAY   START TAKING : FUROSEMIDE AS NEEDED   START TAKING : POTASSIUM AS NEEDED WHEN TAKING FUROSEMIDE   STOP TAKING AND REMOVE THIS MEDICATION FROM YOUR MEDICATION LIST:  HYDRALAZINE   *If you need a refill on your cardiac medications before your next appointment, please call your pharmacy*   Lab Work: NONE ORDERED  TODAY    If you have labs (blood work) drawn today and your tests are completely normal, you will receive your results only by: MyChart Message (if you have MyChart) OR A paper copy in the mail If you have any lab test that is abnormal or we need to change your treatment, we will call you to review the results.   Testing/Procedures: NONE ORDERED  TODAY    Follow-Up: At [facility], you and your health needs are our priority.  As part of our continuing mission to provide you with exceptional heart care, we have created designated Provider Care Teams.  These Care Teams include your primary Cardiologist (physician) and Advanced Practice Providers (APPs -  Physician Assistants and Nurse Practitioners) who all work together to provide you with the care you need, when you need it.  We recommend signing up for the patient portal called "MyChart".  Sign up information is provided on this After Visit Summary.  MyChart is used to connect with patients for Virtual Visits (Telemedicine).  Patients are able to view lab/test results, encounter notes, upcoming appointments, etc.  Non-urgent messages can be sent to your provider as well.   To learn more about what you can do with MyChart, go to [email or web address].    Your next appointment:  [facility]ZE NEXT MONTH 3-4 WEEKS  3- 4 month(s)  Provider:   Tonny Bollman, MD     Other Instructions

## 2023-03-20 DIAGNOSIS — R296 Repeated falls: Secondary | ICD-10-CM | POA: Diagnosis not present

## 2023-03-20 DIAGNOSIS — R2689 Other abnormalities of gait and mobility: Secondary | ICD-10-CM | POA: Diagnosis not present

## 2023-03-20 DIAGNOSIS — R2681 Unsteadiness on feet: Secondary | ICD-10-CM | POA: Diagnosis not present

## 2023-03-21 DIAGNOSIS — R2681 Unsteadiness on feet: Secondary | ICD-10-CM | POA: Diagnosis not present

## 2023-03-21 DIAGNOSIS — R296 Repeated falls: Secondary | ICD-10-CM | POA: Diagnosis not present

## 2023-03-21 DIAGNOSIS — R2689 Other abnormalities of gait and mobility: Secondary | ICD-10-CM | POA: Diagnosis not present

## 2023-03-23 ENCOUNTER — Other Ambulatory Visit: Payer: Self-pay | Admitting: Nephrology

## 2023-03-23 DIAGNOSIS — D631 Anemia in chronic kidney disease: Secondary | ICD-10-CM | POA: Diagnosis not present

## 2023-03-23 DIAGNOSIS — I129 Hypertensive chronic kidney disease with stage 1 through stage 4 chronic kidney disease, or unspecified chronic kidney disease: Secondary | ICD-10-CM | POA: Diagnosis not present

## 2023-03-23 DIAGNOSIS — N183 Chronic kidney disease, stage 3 unspecified: Secondary | ICD-10-CM | POA: Diagnosis not present

## 2023-03-23 DIAGNOSIS — N189 Chronic kidney disease, unspecified: Secondary | ICD-10-CM | POA: Diagnosis not present

## 2023-03-23 DIAGNOSIS — N2581 Secondary hyperparathyroidism of renal origin: Secondary | ICD-10-CM | POA: Diagnosis not present

## 2023-03-23 DIAGNOSIS — N179 Acute kidney failure, unspecified: Secondary | ICD-10-CM | POA: Diagnosis not present

## 2023-03-23 DIAGNOSIS — I5030 Unspecified diastolic (congestive) heart failure: Secondary | ICD-10-CM | POA: Diagnosis not present

## 2023-03-24 DIAGNOSIS — N179 Acute kidney failure, unspecified: Secondary | ICD-10-CM | POA: Diagnosis not present

## 2023-03-25 DIAGNOSIS — M6281 Muscle weakness (generalized): Secondary | ICD-10-CM | POA: Diagnosis not present

## 2023-03-25 DIAGNOSIS — R2689 Other abnormalities of gait and mobility: Secondary | ICD-10-CM | POA: Diagnosis not present

## 2023-03-25 DIAGNOSIS — R2681 Unsteadiness on feet: Secondary | ICD-10-CM | POA: Diagnosis not present

## 2023-03-25 DIAGNOSIS — R296 Repeated falls: Secondary | ICD-10-CM | POA: Diagnosis not present

## 2023-03-25 LAB — LAB REPORT - SCANNED: Albumin, Urine POC: 57.3

## 2023-03-26 DIAGNOSIS — R2681 Unsteadiness on feet: Secondary | ICD-10-CM | POA: Diagnosis not present

## 2023-03-26 DIAGNOSIS — R2689 Other abnormalities of gait and mobility: Secondary | ICD-10-CM | POA: Diagnosis not present

## 2023-03-26 DIAGNOSIS — R296 Repeated falls: Secondary | ICD-10-CM | POA: Diagnosis not present

## 2023-03-27 DIAGNOSIS — R2681 Unsteadiness on feet: Secondary | ICD-10-CM | POA: Diagnosis not present

## 2023-03-27 DIAGNOSIS — R296 Repeated falls: Secondary | ICD-10-CM | POA: Diagnosis not present

## 2023-03-27 DIAGNOSIS — R2689 Other abnormalities of gait and mobility: Secondary | ICD-10-CM | POA: Diagnosis not present

## 2023-03-30 DIAGNOSIS — R296 Repeated falls: Secondary | ICD-10-CM | POA: Diagnosis not present

## 2023-03-30 DIAGNOSIS — R2681 Unsteadiness on feet: Secondary | ICD-10-CM | POA: Diagnosis not present

## 2023-03-30 DIAGNOSIS — R2689 Other abnormalities of gait and mobility: Secondary | ICD-10-CM | POA: Diagnosis not present

## 2023-03-31 DIAGNOSIS — R2681 Unsteadiness on feet: Secondary | ICD-10-CM | POA: Diagnosis not present

## 2023-03-31 DIAGNOSIS — R296 Repeated falls: Secondary | ICD-10-CM | POA: Diagnosis not present

## 2023-03-31 DIAGNOSIS — R2689 Other abnormalities of gait and mobility: Secondary | ICD-10-CM | POA: Diagnosis not present

## 2023-04-02 DIAGNOSIS — R296 Repeated falls: Secondary | ICD-10-CM | POA: Diagnosis not present

## 2023-04-02 DIAGNOSIS — R2689 Other abnormalities of gait and mobility: Secondary | ICD-10-CM | POA: Diagnosis not present

## 2023-04-02 DIAGNOSIS — R2681 Unsteadiness on feet: Secondary | ICD-10-CM | POA: Diagnosis not present

## 2023-04-06 DIAGNOSIS — R2681 Unsteadiness on feet: Secondary | ICD-10-CM | POA: Diagnosis not present

## 2023-04-06 DIAGNOSIS — R2689 Other abnormalities of gait and mobility: Secondary | ICD-10-CM | POA: Diagnosis not present

## 2023-04-06 DIAGNOSIS — R296 Repeated falls: Secondary | ICD-10-CM | POA: Diagnosis not present

## 2023-04-07 ENCOUNTER — Telehealth: Payer: Self-pay

## 2023-04-07 DIAGNOSIS — R2689 Other abnormalities of gait and mobility: Secondary | ICD-10-CM | POA: Diagnosis not present

## 2023-04-07 DIAGNOSIS — R296 Repeated falls: Secondary | ICD-10-CM | POA: Diagnosis not present

## 2023-04-07 DIAGNOSIS — R2681 Unsteadiness on feet: Secondary | ICD-10-CM | POA: Diagnosis not present

## 2023-04-07 NOTE — Telephone Encounter (Signed)
Tried calling to have patient scheduled for AWV phone call but was unable to leave a voicemail.

## 2023-04-07 NOTE — Telephone Encounter (Signed)
Called patient and was unable to LVM

## 2023-04-08 DIAGNOSIS — R2681 Unsteadiness on feet: Secondary | ICD-10-CM | POA: Diagnosis not present

## 2023-04-08 DIAGNOSIS — R2689 Other abnormalities of gait and mobility: Secondary | ICD-10-CM | POA: Diagnosis not present

## 2023-04-08 DIAGNOSIS — R296 Repeated falls: Secondary | ICD-10-CM | POA: Diagnosis not present

## 2023-04-13 DIAGNOSIS — R296 Repeated falls: Secondary | ICD-10-CM | POA: Diagnosis not present

## 2023-04-13 DIAGNOSIS — R2689 Other abnormalities of gait and mobility: Secondary | ICD-10-CM | POA: Diagnosis not present

## 2023-04-13 DIAGNOSIS — R2681 Unsteadiness on feet: Secondary | ICD-10-CM | POA: Diagnosis not present

## 2023-04-14 DIAGNOSIS — R296 Repeated falls: Secondary | ICD-10-CM | POA: Diagnosis not present

## 2023-04-14 DIAGNOSIS — R2681 Unsteadiness on feet: Secondary | ICD-10-CM | POA: Diagnosis not present

## 2023-04-14 DIAGNOSIS — R2689 Other abnormalities of gait and mobility: Secondary | ICD-10-CM | POA: Diagnosis not present

## 2023-04-15 DIAGNOSIS — R2681 Unsteadiness on feet: Secondary | ICD-10-CM | POA: Diagnosis not present

## 2023-04-15 DIAGNOSIS — R296 Repeated falls: Secondary | ICD-10-CM | POA: Diagnosis not present

## 2023-04-15 DIAGNOSIS — R2689 Other abnormalities of gait and mobility: Secondary | ICD-10-CM | POA: Diagnosis not present

## 2023-04-16 DIAGNOSIS — R2689 Other abnormalities of gait and mobility: Secondary | ICD-10-CM | POA: Diagnosis not present

## 2023-04-16 DIAGNOSIS — R296 Repeated falls: Secondary | ICD-10-CM | POA: Diagnosis not present

## 2023-04-16 DIAGNOSIS — M6281 Muscle weakness (generalized): Secondary | ICD-10-CM | POA: Diagnosis not present

## 2023-04-16 DIAGNOSIS — R2681 Unsteadiness on feet: Secondary | ICD-10-CM | POA: Diagnosis not present

## 2023-04-21 DIAGNOSIS — R2689 Other abnormalities of gait and mobility: Secondary | ICD-10-CM | POA: Diagnosis not present

## 2023-04-21 DIAGNOSIS — R296 Repeated falls: Secondary | ICD-10-CM | POA: Diagnosis not present

## 2023-04-21 DIAGNOSIS — R2681 Unsteadiness on feet: Secondary | ICD-10-CM | POA: Diagnosis not present

## 2023-04-21 NOTE — Progress Notes (Signed)
Cardiology Office Note:  .   Date:  05/05/2023  ID:  Paul Mays, DOB 10-26-1927, MRN [id number] PCP: Pincus Sanes, MD   [facility] Providers Cardiologist:  Tonny Bollman, MD    History of Present Illness: .   Paul Mays is a 88 y.o. 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 The patient was diagnosed with severe low-flow low gradient aortic stenosis along with moderate aortic insufficiency and he underwent TAVR via a transfemoral approach in February 2023.       Patient saw Dr. Excell Seltzer 10/2022 with fluid overload and new Afib. He was given daily lasix/K.  Diastolic heart failure was felt related to the presence of severe aortic stenosis, senile sigmoid septum, possible amyloid heart disease which we have not aggressively investigated at age 68, and hypertension. Will continue to treat him medically. Echo reviewed by Dr. Excell Seltzer: Aortic bioprosthetic valve appears to be functioning normally on my review of his echo today with a mean transvalvular gradient ranging from 9 to 12 mmHg and no significant paravalvular regurgitation. He was started on apixaban and ASA was stopped.   I saw patient 03/30/23 and had urinary bleeding and UTI and was on eliquis 2.5 mg bid. He was dizzy and hypotensive.HR was also in the 40's.  I stopped hydralazine, reduced metoprolol 12.5 mg bid and changed lasix to prn.    Patient comes in with his son. No further bleeding. Dizziness has improved. Lightheaded when he first gets up in am. Hasn't had any lasix or K. No chest pain, dyspnea, edema. His son notices a great improvement. He's walking more and seems more alert. Not drinking enough water-more sweet tea/coffee.  ROS:    Studies Reviewed: Marland Kitchen         Prior CV Studies:    ECHO COMPLETE WO IMAGING ENHANCING AGENT 10/28/2022   Narrative ECHOCARDIOGRAM REPORT       Patient Name:   Paul Mays Date of Exam: 10/28/2022 Medical Rec #:   [id number]      Height:       66.0 in Accession #:    [id number]     Weight:       139.2 lb Date of Birth:  20-Apr-1928       BSA:          1.714 m Patient Age:    89 years       BP:           114/65 mmHg Patient Gender: M              HR:           50 bpm. Exam Location:  [address]   Procedure: 2D Echo, 3D Echo, Cardiac Doppler and Color Doppler   Indications:    Z95.2 Status Post TAVR   History:        Patient has prior history of Echocardiogram examinations, most recent 12/13/2021. CAD, Pulmonary HTN, Aortic Valve Disease, Signs/Symptoms:Fatigue and Edema; Risk Factors:Hypertension, Dyslipidemia and Sleep Apnea. Aortic Valve: 23 mm Sapien prosthetic, stented (TAVR) valve is present in the aortic position. Procedure Date: 11/12/21.   Sonographer:    Farrel Conners RDCS Referring Phys: Bobette Mo BURNS   IMPRESSIONS     1. Left ventricular ejection fraction, by estimation, is 65 to 70%. Left ventricular ejection fraction by 3D volume is 72 %. The left ventricle has normal function. The left ventricle has no  regional wall motion abnormalities. There is severe left ventricular hypertrophy. Left ventricular diastolic parameters are indeterminate. Elevated left ventricular end-diastolic pressure. The E/e' is 38. 2. Right ventricular systolic function is mildly reduced. The right ventricular size is moderately enlarged. There is severely elevated pulmonary artery systolic pressure. The estimated right ventricular systolic pressure is 85.2 mmHg. 3. Left atrial size was severely dilated. 4. Right atrial size was moderately dilated. 5. The mitral valve is degenerative. Moderate to severe mitral valve regurgitation. No evidence of mitral stenosis. The mean mitral valve gradient is 3.8 mmHg with average heart rate of 58 bpm. 6. Tricuspid valve regurgitation is moderate to severe. 7. The aortic valve has been repaired/replaced. Aortic valve regurgitation is not visualized. No aortic stenosis is  present. There is a 23 mm Sapien prosthetic (TAVR) valve present in the aortic position. Procedure Date: 11/12/21. Aortic valve area, by VTI measures (EOA) 2.45 cm, iEOA 1.44 cm2/m2. DVI 0.59. Aortic valve mean gradient measures 11.0 mmHg. Aortic valve Vmax measures 2.21 m/s. 8. The inferior vena cava is dilated in size with <50% respiratory variability, suggesting right atrial pressure of 15 mmHg.   FINDINGS Left Ventricle: Left ventricular ejection fraction, by estimation, is 65 to 70%. Left ventricular ejection fraction by 3D volume is 72 %. The left ventricle has normal function. The left ventricle has no regional wall motion abnormalities. The left ventricular internal cavity size was normal in size. There is severe left ventricular hypertrophy. Left ventricular diastolic parameters are indeterminate. Elevated left ventricular end-diastolic pressure. The E/e' is 54.   Right Ventricle: The right ventricular size is moderately enlarged. No increase in right ventricular wall thickness. Right ventricular systolic function is mildly reduced. There is severely elevated pulmonary artery systolic pressure. The tricuspid regurgitant velocity is 4.19 m/s, and with an assumed right atrial pressure of 15 mmHg, the estimated right ventricular systolic pressure is 85.2 mmHg.   Left Atrium: Left atrial size was severely dilated.   Right Atrium: Right atrial size was moderately dilated.   Pericardium: There is no evidence of pericardial effusion.   Mitral Valve: The mitral valve is degenerative in appearance. Mild to moderate mitral annular calcification. Moderate to severe mitral valve regurgitation. No evidence of mitral valve stenosis. The mean mitral valve gradient is 3.8 mmHg with average heart rate of 58 bpm.   Tricuspid Valve: The tricuspid valve is normal in structure. Tricuspid valve regurgitation is moderate to severe. No evidence of tricuspid stenosis.   Aortic Valve: The aortic valve has been  repaired/replaced. Aortic valve regurgitation is not visualized. No aortic stenosis is present. Aortic valve mean gradient measures 11.0 mmHg. Aortic valve peak gradient measures 19.5 mmHg. Aortic valve area, by VTI measures 2.45 cm. There is a 23 mm Sapien prosthetic, stented (TAVR) valve present in the aortic position. Procedure Date: 11/12/21.   Pulmonic Valve: The pulmonic valve was normal in structure. Pulmonic valve regurgitation is mild. No evidence of pulmonic stenosis.   Aorta: The aortic root is normal in size and structure.   Venous: The inferior vena cava is dilated in size with less than 50% respiratory variability, suggesting right atrial pressure of 15 mmHg.   IAS/Shunts: No atrial level shunt detected by color flow Doppler.     LEFT VENTRICLE PLAX 2D LVIDd:         4.60 cm         Diastology LVIDs:         2.60 cm         LV  e' medial:    4.67 cm/s LV PW:         1.20 cm         LV E/e' medial:  38.3 LV IVS:        1.40 cm         LV e' lateral:   9.28 cm/s LVOT diam:     2.30 cm         LV E/e' lateral: 19.3 LV SV:         99 LV SV Index:   58 LVOT Area:     4.15 cm        3D Volume EF LV 3D EF:    Left ventricul ar ejection fraction by 3D volume is 72 %.   3D Volume EF: 3D EF:        72 % LV EDV:       142 ml LV ESV:       39 ml LV SV:        102 ml   RIGHT VENTRICLE RV Basal diam:  4.70 cm RV S prime:     8.49 cm/s TAPSE (M-mode): 1.7 cm   LEFT ATRIUM              Index         RIGHT ATRIUM           Index LA diam:        6.10 cm  3.56 cm/m    RA Area:     28.50 cm LA Vol (A2C):   212.0 ml 123.66 ml/m  RA Volume:   101.00 ml 58.91 ml/m LA Vol (A4C):   191.0 ml 111.41 ml/m LA Biplane Vol: 208.0 ml 121.33 ml/m AORTIC VALVE AV Area (Vmax):    2.30 cm AV Area (Vmean):   2.47 cm AV Area (VTI):     2.45 cm AV Vmax:           221.00 cm/s AV Vmean:          141.200 cm/s AV VTI:            0.404 m AV Peak Grad:      19.5 mmHg AV Mean Grad:       11.0 mmHg LVOT Vmax:         122.33 cm/s LVOT Vmean:        83.900 cm/s LVOT VTI:          0.238 m LVOT/AV VTI ratio: 0.59   AORTA Ao Root diam: 3.00 cm Ao Asc diam:  3.50 cm   MITRAL VALVE                TRICUSPID VALVE MV Area (PHT): 5.10 cm     TR Peak grad:   70.2 mmHg MV Mean grad:  3.8 mmHg     TR Vmax:        419.00 cm/s MV Decel Time: 149 msec MR Peak grad: 114.5 mmHg    SHUNTS MR Mean grad: 73.5 mmHg     Systemic VTI:  0.24 m MR Vmax:      535.00 cm/s   Systemic Diam: 2.30 cm MR Vmean:     407.0 cm/s MV E velocity: 178.75 cm/s MV A velocity: 51.20 cm/s MV E/A ratio:  3.49   Weston Brass MD Electronically signed by Weston Brass MD Signature Date/Time: 10/28/2022/2:51:43 PM       Final  Risk Assessment/Calculations:    CHA2DS2-VASc Score = 5   This  indicates a 7.2% annual risk of stroke. The patient's score is based upon: CHF History: 1 HTN History: 1 Diabetes History: 0 Stroke History: 0 Vascular Disease History: 1 Age Score: 2 Gender Score: 0            Physical Exam:   VS:  BP 116/60   Pulse 64   Ht 5' (1.524 m)   Wt 121 lb 3.2 oz (55 kg)   SpO2 96%   BMI 23.67 kg/m    Wt Readings from Last 3 Encounters:  05/05/23 121 lb 3.2 oz (55 kg)  03/17/23 119 lb 12.8 oz (54.3 kg)  02/13/23 134 lb (60.8 kg)    GEN: Well nourished, well developed in no acute distress NECK: No JVD; No carotid bruits CARDIAC:  irreg, 2/6 systolic murmur LSB RESPIRATORY:  Clear to auscultation without rales, wheezing or rhonchi  ABDOMEN: Soft, non-tender, non-distended EXTREMITIES:  No edema; No deformity   ASSESSMENT AND PLAN: .    Chronic diastolic CHF-still appears on dry side. Crt up 3.41 02/23/23 and lasix held for a week and then restarted at lower dose 20 mg daily. I changed lasix & potassium to prn and he's doing much better. No edema.check labs today.   S/P TAVR for severe AS Aortic bioprosthetic valve appears to be functioning normally  his last echo  with a mean transvalvular gradient ranging from 9 to 12 mmHg and no significant paravalvular regurgitation   PAF -in A flutter on eliquis 2.5 mg appropriate dose based on age/weight less than 60 kg. I degreased metoprolol 12.5 mg bid and because of HR 40 -dtoday HR 64 and feeling much better   Hypotension-improved with stopping lasix, Hydralazien, K and decreasing metoprolol.    CAD-no chest pain   Possible amyloid heart disease         Dispo: f/u with Dr. Excell Seltzer as scheduled in Oct.  Signed, Jacolyn Reedy, PA-C

## 2023-04-24 DIAGNOSIS — R2689 Other abnormalities of gait and mobility: Secondary | ICD-10-CM | POA: Diagnosis not present

## 2023-04-24 DIAGNOSIS — R2681 Unsteadiness on feet: Secondary | ICD-10-CM | POA: Diagnosis not present

## 2023-04-24 DIAGNOSIS — R296 Repeated falls: Secondary | ICD-10-CM | POA: Diagnosis not present

## 2023-04-26 DIAGNOSIS — R2681 Unsteadiness on feet: Secondary | ICD-10-CM | POA: Diagnosis not present

## 2023-04-26 DIAGNOSIS — R2689 Other abnormalities of gait and mobility: Secondary | ICD-10-CM | POA: Diagnosis not present

## 2023-04-26 DIAGNOSIS — R296 Repeated falls: Secondary | ICD-10-CM | POA: Diagnosis not present

## 2023-04-28 ENCOUNTER — Other Ambulatory Visit: Payer: Self-pay | Admitting: Internal Medicine

## 2023-04-28 DIAGNOSIS — M2011 Hallux valgus (acquired), right foot: Secondary | ICD-10-CM | POA: Diagnosis not present

## 2023-04-28 DIAGNOSIS — B351 Tinea unguium: Secondary | ICD-10-CM | POA: Diagnosis not present

## 2023-04-29 DIAGNOSIS — R296 Repeated falls: Secondary | ICD-10-CM | POA: Diagnosis not present

## 2023-04-29 DIAGNOSIS — R2681 Unsteadiness on feet: Secondary | ICD-10-CM | POA: Diagnosis not present

## 2023-04-29 DIAGNOSIS — R2689 Other abnormalities of gait and mobility: Secondary | ICD-10-CM | POA: Diagnosis not present

## 2023-04-30 DIAGNOSIS — M6281 Muscle weakness (generalized): Secondary | ICD-10-CM | POA: Diagnosis not present

## 2023-04-30 DIAGNOSIS — R2689 Other abnormalities of gait and mobility: Secondary | ICD-10-CM | POA: Diagnosis not present

## 2023-04-30 DIAGNOSIS — R2681 Unsteadiness on feet: Secondary | ICD-10-CM | POA: Diagnosis not present

## 2023-04-30 DIAGNOSIS — R296 Repeated falls: Secondary | ICD-10-CM | POA: Diagnosis not present

## 2023-05-05 ENCOUNTER — Encounter: Payer: Self-pay | Admitting: Physician Assistant

## 2023-05-05 ENCOUNTER — Ambulatory Visit: Payer: Medicare Other | Attending: Physician Assistant | Admitting: Physician Assistant

## 2023-05-05 VITALS — BP 116/60 | HR 64 | Ht 60.0 in | Wt 121.2 lb

## 2023-05-05 DIAGNOSIS — Z952 Presence of prosthetic heart valve: Secondary | ICD-10-CM | POA: Insufficient documentation

## 2023-05-05 DIAGNOSIS — I5032 Chronic diastolic (congestive) heart failure: Secondary | ICD-10-CM | POA: Insufficient documentation

## 2023-05-05 DIAGNOSIS — R296 Repeated falls: Secondary | ICD-10-CM | POA: Diagnosis not present

## 2023-05-05 DIAGNOSIS — I4819 Other persistent atrial fibrillation: Secondary | ICD-10-CM | POA: Insufficient documentation

## 2023-05-05 DIAGNOSIS — Z9861 Coronary angioplasty status: Secondary | ICD-10-CM | POA: Diagnosis not present

## 2023-05-05 DIAGNOSIS — I952 Hypotension due to drugs: Secondary | ICD-10-CM | POA: Diagnosis not present

## 2023-05-05 DIAGNOSIS — I10 Essential (primary) hypertension: Secondary | ICD-10-CM | POA: Diagnosis not present

## 2023-05-05 DIAGNOSIS — R2689 Other abnormalities of gait and mobility: Secondary | ICD-10-CM | POA: Diagnosis not present

## 2023-05-05 DIAGNOSIS — I2510 Atherosclerotic heart disease of native coronary artery without angina pectoris: Secondary | ICD-10-CM | POA: Diagnosis not present

## 2023-05-05 DIAGNOSIS — R2681 Unsteadiness on feet: Secondary | ICD-10-CM | POA: Diagnosis not present

## 2023-05-05 LAB — BASIC METABOLIC PANEL
BUN/Creatinine Ratio: 18 (ref 10–24)
BUN: 23 mg/dL (ref 10–36)
CO2: 24 mmol/L (ref 20–29)
Calcium: 9.9 mg/dL (ref 8.6–10.2)
Chloride: 105 mmol/L (ref 96–106)
Creatinine, Ser: 1.26 mg/dL (ref 0.76–1.27)
Glucose: 123 mg/dL — ABNORMAL HIGH (ref 70–99)
Potassium: 4.8 mmol/L (ref 3.5–5.2)
Sodium: 142 mmol/L (ref 134–144)
eGFR: 53 mL/min/{1.73_m2} — ABNORMAL LOW (ref >59)

## 2023-05-05 LAB — CBC

## 2023-05-05 NOTE — Patient Instructions (Signed)
Medication Instructions:  Your physician recommends that you continue on your current medications as directed. Please refer to the Current Medication list given to you today.   *If you need a refill on your cardiac medications before your next appointment, please call your pharmacy*   Lab Work: Bmp, Cbc - today   If you have labs (blood work) drawn today and your tests are completely normal, you will receive your results only by: MyChart Message (if you have MyChart) OR A paper copy in the mail If you have any lab test that is abnormal or we need to change your treatment, we will call you to review the results.   Testing/Procedures: None ordered    Follow-Up: Follow up as scheduled   Other Instructions

## 2023-05-06 DIAGNOSIS — R296 Repeated falls: Secondary | ICD-10-CM | POA: Diagnosis not present

## 2023-05-06 DIAGNOSIS — R2689 Other abnormalities of gait and mobility: Secondary | ICD-10-CM | POA: Diagnosis not present

## 2023-05-06 DIAGNOSIS — R2681 Unsteadiness on feet: Secondary | ICD-10-CM | POA: Diagnosis not present

## 2023-05-07 DIAGNOSIS — R2681 Unsteadiness on feet: Secondary | ICD-10-CM | POA: Diagnosis not present

## 2023-05-07 DIAGNOSIS — R296 Repeated falls: Secondary | ICD-10-CM | POA: Diagnosis not present

## 2023-05-07 DIAGNOSIS — R2689 Other abnormalities of gait and mobility: Secondary | ICD-10-CM | POA: Diagnosis not present

## 2023-05-13 DIAGNOSIS — R2689 Other abnormalities of gait and mobility: Secondary | ICD-10-CM | POA: Diagnosis not present

## 2023-05-13 DIAGNOSIS — R2681 Unsteadiness on feet: Secondary | ICD-10-CM | POA: Diagnosis not present

## 2023-05-13 DIAGNOSIS — R296 Repeated falls: Secondary | ICD-10-CM | POA: Diagnosis not present

## 2023-05-14 ENCOUNTER — Telehealth: Payer: Self-pay | Admitting: Internal Medicine

## 2023-05-14 DIAGNOSIS — R296 Repeated falls: Secondary | ICD-10-CM | POA: Diagnosis not present

## 2023-05-14 DIAGNOSIS — R2681 Unsteadiness on feet: Secondary | ICD-10-CM | POA: Diagnosis not present

## 2023-05-14 DIAGNOSIS — R2689 Other abnormalities of gait and mobility: Secondary | ICD-10-CM | POA: Diagnosis not present

## 2023-05-14 NOTE — Telephone Encounter (Signed)
Celise called from [facility] stating pt pulse is 42 on yesterday and today its 59 and his bp is really good 128/65. She also stated the pt been up and moving around.

## 2023-05-14 NOTE — Telephone Encounter (Signed)
Ok to just monitor given normal repeat VS

## 2023-05-18 ENCOUNTER — Other Ambulatory Visit: Payer: Self-pay

## 2023-05-18 ENCOUNTER — Ambulatory Visit: Payer: Medicare Other | Admitting: Family Medicine

## 2023-05-18 ENCOUNTER — Emergency Department (HOSPITAL_COMMUNITY)
Admission: EM | Admit: 2023-05-18 | Discharge: 2023-05-19 | Discharge status: Against Medical Advice | Payer: Medicare Other | Attending: Emergency Medicine | Admitting: Emergency Medicine

## 2023-05-18 ENCOUNTER — Encounter (HOSPITAL_COMMUNITY): Payer: Self-pay

## 2023-05-18 DIAGNOSIS — Z5321 Procedure and treatment not carried out due to patient leaving prior to being seen by health care provider: Secondary | ICD-10-CM | POA: Diagnosis not present

## 2023-05-18 DIAGNOSIS — R319 Hematuria, unspecified: Secondary | ICD-10-CM | POA: Insufficient documentation

## 2023-05-18 NOTE — ED Triage Notes (Signed)
Patient arrived POV reports 2 episodes of hematuria yesterday, states has since resolved. Patient reports prostate issues years ago, No complications since. Denies N/V, weakness, cp,dizziness or any other symptoms. No other complaints.

## 2023-05-18 NOTE — Progress Notes (Deleted)
[facility] [address] [place], [place] [address] [phone]  Date:  05/18/2023   Name:  Paul Mays   DOB:  02-Jul-1928   MRN:  [id number]  PCP:  Pincus Sanes, MD    Chief Complaint: No chief complaint on file.   History of Present Illness:  Paul Mays is a 89 y.o. very pleasant male patient who presents with the following:  Primary patient of Dr. Lawerance Bach, seen today with concern of blood in urine I am not seen him myself previously Seen by cardiology about 2 weeks ago:  Chronic diastolic CHF-still appears on dry side. Crt up 3.41 02/23/23 and lasix held for a week and then restarted at lower dose 20 mg daily. I changed lasix & potassium to prn and he's doing much better. No edema.check labs today.   S/P TAVR for severe AS Aortic bioprosthetic valve appears to be functioning normally  his last echo with a mean transvalvular gradient ranging from 9 to 12 mmHg and no significant paravalvular regurgitation   PAF -in A flutter on eliquis 2.5 mg appropriate dose based on age/weight less than 60 kg. I degreased metoprolol 12.5 mg bid and because of HR 40 -dtoday HR 64 and feeling much better   Hypotension-improved with stopping lasix, Hydralazien, K and decreasing metoprolol.    CAD-no chest pain   Possible amyloid heart disease     Patient Active Problem List   Diagnosis Date Noted   Urine frequency 03/05/2023   Physical deconditioning 01/09/2023   Agitation 01/08/2023   Hypoxia 01/01/2023   Paroxysmal atrial fibrillation (HCC) 12/31/2022   Chronic diastolic CHF (congestive heart failure) (HCC) 12/31/2022   Gross hematuria 12/27/2022   Multiple skin tears 11/29/2022   Chronic kidney disease, stage 3 (HCC) 11/10/2022   Arthritis 11/10/2022   Headache 11/10/2022   Herpes zoster 11/10/2022   Peripheral edema 11/10/2022   Ulcerative colitis (HCC) 11/10/2022   CAD (coronary artery disease) 11/10/2022    Neuropathy 11/10/2022   Prostate cancer (HCC) 11/10/2022   Acquired trigger finger of right little finger 08/19/2022   Acquired trigger finger of right middle finger 08/19/2022   Anemia 07/22/2022   Chronic neck pain 06/10/2022   Trigger finger, right middle finger 04/01/2022   Trigger little finger of right hand 03/27/2022   S/P TAVR (transcatheter aortic valve replacement) 11/12/2021   Dyspnea on exertion 08/05/2021   Acute on chronic diastolic heart failure (HCC) 08/05/2021   Weight loss 02/19/2021   Lightheaded 02/19/2021   Periumbilical pain 02/19/2021   Fall 08/21/2020   Head trauma, initial encounter 08/21/2020   Bilateral leg edema 01/26/2020   GERD (gastroesophageal reflux disease) 01/26/2020   Chronic pain of both shoulders 12/10/2018   Easy bruising 03/20/2018   Decreased hearing of right ear 09/04/2017   Rotator cuff tear arthropathy of both shoulders 10/18/2015   Thrombocytopenia (HCC) 09/24/2015   Diverticulosis of colon without hemorrhage 07/02/2014   Hypertrophic cardiomyopathy (HCC) 03/24/2011   OBSTRUCTIVE SLEEP APNEA 11/08/2010   HYPERTENSION, PULMONARY 09/16/2010   HEARING LOSS, BILATERAL 06/18/2009   Coronary atherosclerosis 03/07/2009   Severe aortic stenosis 03/07/2009   CAROTID STENOSIS 03/07/2009   Hyperlipidemia 11/15/2008   Peripheral neuropathy, idiopathic 11/15/2008   Essential hypertension 11/15/2008   COLITIS, HX OF 11/15/2008   VENOUS INSUFFICIENCY 04/20/2007   Gout 02/10/2007    Past Medical History:  Diagnosis Date   Anxiety    Aortic stenosis  Arthralgia    Arthritis    CAD (coronary artery disease)    Carotid stenosis    Cervicalgia    CLUSTER HEADACHE 02/10/2007   Qualifier: Diagnosis of  By: Alwyn Ren MD, Chrissie Noa     Colitis    Edema    Gout    Hearing loss    Herpes zoster without mention of complication    Hyperlipidemia    Hypertension    Hyperuricemia    Impacted cerumen    Migraine    Migraine    Murmur     Olecranon bursitis    OSA (obstructive sleep apnea)    Other and unspecified hyperlipidemia    Other malaise and fatigue    Peripheral neuropathy    Polyneuropathy    Prostate cancer (HCC)    Pulmonary hypertension (HCC)    S/P TAVR (transcatheter aortic valve replacement) 11/12/2021   23mm S3UR via TF approach with Dr. Excell Seltzer and Dr. [name]   Unspecified hereditary and idiopathic peripheral neuropathy    Venous insufficiency     Past Surgical History:  Procedure Laterality Date   ANGIOPLASTY     w/ 2 stents in 1999   APPENDECTOMY     CATARACT EXTRACTION W/ INTRAOCULAR LENS IMPLANT Bilateral    HEMORRHOID SURGERY     INTRAOPERATIVE TRANSTHORACIC ECHOCARDIOGRAM N/A 11/12/2021   Procedure: INTRAOPERATIVE TRANSTHORACIC ECHOCARDIOGRAM;  Surgeon: Tonny Bollman, MD;  Location[facility];  Service: Open Heart Surgery;  Laterality: N/A;   LIPOMA EXCISION     RIGHT/LEFT HEART CATH AND CORONARY ANGIOGRAPHY N/A 10/02/2021   Procedure: RIGHT/LEFT HEART CATH AND CORONARY ANGIOGRAPHY;  Surgeon: Tonny Bollman, MD;  Location: [facility] INVASIVE CV LAB;  Service: Cardiovascular;  Laterality: N/A;   TONSILLECTOMY     TRANSCATHETER AORTIC VALVE REPLACEMENT, TRANSFEMORAL N/A 11/12/2021   Procedure: TRANSCATHETER AORTIC VALVE REPLACEMENT, TRANSFEMORAL;  Surgeon: Tonny Bollman, MD;  Location: [facility] OR;  Service: Open Heart Surgery;  Laterality: N/A;   ULTRASOUND GUIDANCE FOR VASCULAR ACCESS Bilateral 11/12/2021   Procedure: ULTRASOUND GUIDANCE FOR VASCULAR ACCESS;  Surgeon: Tonny Bollman, MD;  Location: [facility] OR;  Service: Open Heart Surgery;  Laterality: Bilateral;    Social History   Tobacco Use   Smoking status: Never   Smokeless tobacco: Never  Vaping Use   Vaping status: Never Used  Substance Use Topics   Alcohol use: Yes    Alcohol/week: 14.0 standard drinks of alcohol    Types: 14 Standard drinks or equivalent per week    Comment: 2 drink before dinner vodka    Drug use: No    Family History   Problem Relation Age of Onset   Stroke Father        Died, 49   High blood pressure Father    Diabetes Father    Other Mother        Died, 69   Diabetes Brother        Died, 61   Diabetes Sister        Living, 39   Healthy Daughter    Healthy Son     Allergies  Allergen Reactions   Lisinopril Other (See Comments)    Hyperkalemia in the context of combined spironolactone and lisinopril therapy   Spironolactone Other (See Comments)    Hyperkalemia in context of spironolactone and lisinopril therapy    Medication list has been reviewed and updated.  Current Outpatient Medications on File Prior to Visit  Medication Sig Dispense Refill   acetaminophen (TYLENOL) 325 MG tablet Take 2  tablets (650 mg total) by mouth 2 (two) times daily. May take up to 2 Tabs Q 6 hr prn for pain - MDD 3000 mg 240 tablet 11   allopurinol (ZYLOPRIM) 300 MG tablet TAKE 1 TABLET(300 MG) BY MOUTH DAILY AS NEEDED FOR GOUT FLARE 90 tablet 0   amoxicillin (AMOXIL) 500 MG tablet GIVE 4 TABS (2000MG ) BY MOUTH 1H BEFORE APPT 4 tablet 2   ANTI-DIARRHEAL 2 MG tablet GIVE 1 TAB BY MOUTH 4 TIMES DAILY AS NEEDED FOR DIARRHEA OR LOOSE STOOLS 30 tablet 11   apixaban (ELIQUIS) 2.5 MG TABS tablet GIVE 1 TAB BY MOUTH TWICE DAILY 60 tablet 5   Bismuth Subsalicylate (PEPTO BISMOL PO) Take by mouth as needed. Prn as needed for upset stomach     Cholecalciferol (VITAMIN D3) 50 MCG (2000 UT) capsule GIVE 1 CAP BY MOUTH ONCE DAILY 30 capsule 11   furosemide (LASIX) 20 MG tablet Take 1 tablet (20 mg total) by mouth daily as needed for fluid. 90 tablet 3   iron polysaccharides (NIFEREX) 150 MG capsule Take 1 capsule (150 mg total) by mouth daily. 30 capsule 5   metoprolol tartrate (LOPRESSOR) 25 MG tablet Take 0.5 tablets (12.5 mg total) by mouth 2 (two) times daily. 30 tablet 11   oxybutynin (DITROPAN) 5 MG tablet Take 2 tablets (10 mg total) by mouth every 8 (eight) hours as needed for bladder spasms. 20 tablet 0   potassium  chloride (KLOR-CON M) 10 MEQ tablet Take 1 tablet (10 mEq total) by mouth daily as needed (ONLY WHEN TAKING FUROSEMIDE). Take 1 tablet by mouth daily (with Furosemide) 90 tablet 3   pravastatin (PRAVACHOL) 40 MG tablet GIVE 1 TAB BY MOUTH ONCE DAILY 30 tablet 11   QUEtiapine (SEROQUEL) 25 MG tablet GIVE 1 TAB BY MOUTH AT BEDTIME 30 tablet 5   vitamin B-12 (CYANOCOBALAMIN) 500 MCG tablet GIVE 1 TAB BY MOUTH ONCE DAILY 30 tablet 11   No current facility-administered medications on file prior to visit.    Review of Systems:  As per HPI- otherwise negative.   Physical Examination: There were no vitals filed for this visit. There were no vitals filed for this visit. There is no height or weight on file to calculate BMI. Ideal Body Weight:    GEN: no acute distress. HEENT: Atraumatic, Normocephalic.  Ears and Nose: No external deformity. CV: RRR, No M/G/R. No JVD. No thrill. No extra heart sounds. PULM: CTA B, no wheezes, crackles, rhonchi. No retractions. No resp. distress. No accessory muscle use. ABD: S, NT, ND, +BS. No rebound. No HSM. EXTR: No c/c/e PSYCH: Normally interactive. Conversant.    Assessment and Plan: ***  Signed Abbe Amsterdam, MD

## 2023-05-19 ENCOUNTER — Other Ambulatory Visit: Payer: Self-pay | Admitting: Internal Medicine

## 2023-05-19 ENCOUNTER — Encounter: Payer: Self-pay | Admitting: Internal Medicine

## 2023-05-19 ENCOUNTER — Ambulatory Visit (INDEPENDENT_AMBULATORY_CARE_PROVIDER_SITE_OTHER): Payer: Medicare Other | Admitting: Internal Medicine

## 2023-05-19 VITALS — BP 154/62 | HR 64 | Temp 97.9°F | Resp 16 | Ht 60.0 in | Wt 123.0 lb

## 2023-05-19 DIAGNOSIS — R310 Gross hematuria: Secondary | ICD-10-CM

## 2023-05-19 DIAGNOSIS — D696 Thrombocytopenia, unspecified: Secondary | ICD-10-CM | POA: Diagnosis not present

## 2023-05-19 DIAGNOSIS — S81812A Laceration without foreign body, left lower leg, initial encounter: Secondary | ICD-10-CM

## 2023-05-19 LAB — CBC WITH DIFFERENTIAL/PLATELET
Basophils Absolute: 0.1 10*3/uL (ref 0.0–0.1)
Basophils Relative: 0.9 % (ref 0.0–3.0)
Eosinophils Absolute: 0.1 10*3/uL (ref 0.0–0.7)
Eosinophils Relative: 1.6 % (ref 0.0–5.0)
HCT: 32.6 % — ABNORMAL LOW (ref 39.0–52.0)
Hemoglobin: 10.3 g/dL — ABNORMAL LOW (ref 13.0–17.0)
Lymphocytes Relative: 16.8 % (ref 12.0–46.0)
Lymphs Abs: 1 10*3/uL (ref 0.7–4.0)
MCHC: 31.6 g/dL (ref 30.0–36.0)
MCV: 95.3 fl (ref 78.0–100.0)
Monocytes Absolute: 0.7 10*3/uL (ref 0.1–1.0)
Monocytes Relative: 12.3 % — ABNORMAL HIGH (ref 3.0–12.0)
Neutro Abs: 4 10*3/uL (ref 1.4–7.7)
Neutrophils Relative %: 68.4 % (ref 43.0–77.0)
Platelets: 128 10*3/uL — ABNORMAL LOW (ref 150.0–400.0)
RBC: 3.42 Mil/uL — ABNORMAL LOW (ref 4.22–5.81)
RDW: 18.8 % — ABNORMAL HIGH (ref 11.5–15.5)
WBC: 5.8 10*3/uL (ref 4.0–10.5)

## 2023-05-19 LAB — URINALYSIS, ROUTINE W REFLEX MICROSCOPIC
Hgb urine dipstick: NEGATIVE
Ketones, ur: NEGATIVE
Leukocytes,Ua: NEGATIVE
Nitrite: NEGATIVE
RBC / HPF: NONE SEEN (ref 0–?)
Specific Gravity, Urine: 1.025 (ref 1.000–1.030)
Total Protein, Urine: NEGATIVE
Urine Glucose: NEGATIVE
Urobilinogen, UA: 0.2 (ref 0.0–1.0)
pH: 6 (ref 5.0–8.0)
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
## 2023-05-19 NOTE — Patient Instructions (Signed)
Thrombocytopenia Thrombocytopenia is a condition in which there are a low number of platelets in the blood. Platelets are also called thrombocytes. Platelets are parts of blood that stick together and form a clot to help the body stop bleeding after an injury. If you have too few platelets, your blood may have trouble clotting. This may cause you to bleed and bruise very easily. Some cases of thrombocytopenia are mild while others are more severe. What are the causes? This condition is caused by a low number of platelets in your blood. There are three main reasons for this: Your body not making enough platelets. This may be caused by: Bone marrow diseases. This include aplastic anemia, leukemia, and myelodysplastic anemia. Congenital thrombocytopenia. This is a condition that is passed from parent to child (inherited). Certain cancer treatments, including chemotherapy and radiation therapy. Infections from bacteria or viruses. Alcohol use disorder and alcoholism. Platelets not being released in the blood. This is called platelet sequestration and it can happen due to: An overactive spleen (hypersplenism). The spleen gathers up platelets from circulation, meaning that the platelets are not available to help with clotting your blood. The spleen can be enlarged because of scarring or other conditions. Gaucher disease. Your body destroying platelets too quickly. This may be caused by: An autoimmune disease that causes immune thrombocytopenia (ITP). ITP is sometimes associated with other autoimmune conditions such as lupus. Certain medicines, such as blood thinners. Certain blood clotting or bleeding disorders. Exposure to toxic chemicals, such as pesticides, lead, benzene, and arsenic. Pregnancy. What are the signs or symptoms? Symptoms of this condition are the result of poor blood clotting. They will vary depending on how low the platelet counts are. Symptoms may include: Bruising  easily. Bleeding from the mouth or nose. Heavy menstrual periods. Blood in the urine, stool (feces), or vomit. Purplish-red discolorations on the skin (purpura). A rash that looks like pinpoint, purplish-red spots (petechiae) on the lower legs. How is this diagnosed?  This condition may be diagnosed with blood tests and a physical exam. You may also have other tests, including: A sample of bone marrow (biopsy) may be removed to look for the original cells that make platelets. An ultrasound or CT scan of the abdomen to check for an enlarged spleen, enlarged lymph nodes, or liver problems. How is this treated? Treatment for this condition depends on the cause. Treatment may include: Treatment of another condition that is causing the low platelet count. Medicines to help protect your platelets from being destroyed. A replacement (transfusion) of platelets to stop or prevent bleeding. Surgery to remove the spleen. Follow these instructions at home: Medicines Take over-the-counter and prescription medicines only as told by your health care provider. Do not take any medicines that contain aspirin or NSAIDs, such as ibuprofen. These medicines increase your risk for dangerous bleeding. Activity Avoid activities that could cause injury or bruising, and follow instructions about how to prevent falls. Do not play contact sports. Ask your health care provider what activities are safe for you. Take extra care to protect yourself from burns when ironing or cooking. Take extra care not to cut yourself when you shave or when you use scissors, needles, knives, and other tools. General instructions  Check your skin and the inside of your mouth for bruising or bleeding as told by your health care provider. Wear a medical alert bracelet that says that you have a bleeding disorder. This can help you get the treatment you need in case of emergency. Check   your urine and stool for blood as told by your health  care provider. Do not drink alcohol. If you do drink alcohol, limit the amount that you drink. Minimize contact with toxic chemicals. Tell all your health care providers, including dental care providers and eye doctors, about your condition. Make sure to tell dental care providers before you have any procedure done, including dental cleanings. Keep all follow-up visits. This is important. Contact a health care provider if: You have unexplained bruising. You have new symptoms. You have symptoms that get worse. You have a fever. Get help right away if: You have severe bleeding from anywhere on your body. You have blood in your vomit, urine, or stool. You have an injury to your head. You have a sudden, severe headache. Summary Thrombocytopenia is a condition in which you have a low number of platelets in the blood. Platelets are parts of blood that stick together to form a clot. Symptoms of this condition are the result of poor blood clotting and may include bruising easily, bleeding from the nose or mouth, petechiae, and purpura. This condition may be diagnosed with blood tests and a physical exam. Treatment for this condition depends on the cause. This information is not intended to replace advice given to you by your health care provider. Make sure you discuss any questions you have with your health care provider. Document Revised: 03/07/2021 Document Reviewed: 03/07/2021 Elsevier Patient Education  2024 [employer]  

## 2023-05-21 DIAGNOSIS — N401 Enlarged prostate with lower urinary tract symptoms: Secondary | ICD-10-CM | POA: Diagnosis not present

## 2023-05-21 DIAGNOSIS — R310 Gross hematuria: Secondary | ICD-10-CM | POA: Diagnosis not present

## 2023-05-21 DIAGNOSIS — R350 Frequency of micturition: Secondary | ICD-10-CM | POA: Diagnosis not present

## 2023-05-25 ENCOUNTER — Encounter: Payer: Self-pay | Admitting: *Deleted

## 2023-05-25 ENCOUNTER — Telehealth: Payer: Self-pay | Admitting: *Deleted

## 2023-05-25 DIAGNOSIS — R2689 Other abnormalities of gait and mobility: Secondary | ICD-10-CM | POA: Diagnosis not present

## 2023-05-25 DIAGNOSIS — R2681 Unsteadiness on feet: Secondary | ICD-10-CM | POA: Diagnosis not present

## 2023-05-25 NOTE — Transitions of Care (Post Inpatient/ED Visit) (Signed)
05/25/2023  Name: Paul Mays MRN: [id number] DOB: 1928/10/01  Today's TOC FU Call Status: Today's TOC FU Call Status:: Successful TOC FU Call Completed TOC FU Call Complete Date: 05/25/23  ED EMMI Red Alert notification on 05/22/23 from ED visit 05/19/23- automated EMMI call placed 12/18/22: "No scheduled follow up" and "no discharge instructions"  Transition Care Management Follow-up Telephone Call Date of Discharge: 05/18/23 Discharge Facility: [facility]) Type of Discharge: Emergency Department Reason for ED Visit: Other: (gross hematuria and skin tear) How have you been since you were released from the hospital?: Better ("Things seem to be fine now.  Not having any more problems.  The staff at [facility] are taking care of the skin tear.  My sons are taking me to and from doctor appointments now that I am not driving anymore and living at [facility]") Any questions or concerns?: No  Items Reviewed: Did you receive and understand the discharge instructions provided?: No (verified no discharge instructions- patient left ED Visit without being seen, after triage) Medications obtained,verified, and reconciled?: No Medications Not Reviewed Reasons:: Other: (patient declined medication review) Any new allergies since your discharge?: No Dietary orders reviewed?: Yes Type of Diet Ordered:: "Healthy as possible, pretty much regular" Do you have support at home?: Yes People in Home: spouse Name of Support/Comfort Primary Source: Reports essentially independent in self-care activities; supportive sons assists as/ if needed/ indicated -- patient and his spouse are now residing at [facility] facility  Medications Reviewed Today: Medications Reviewed Today     Reviewed by Michaela Corner, RN (Registered Nurse) on 05/25/23 at 1550  Med List Status: <None>   Medication Order Taking? Sig Documenting Provider Last Dose Status Informant  acetaminophen (TYLENOL) 325 MG  tablet [id number] No Take 2 tablets (650 mg total) by mouth 2 (two) times daily. May take up to 2 Tabs Q 6 hr prn for pain - MDD 3000 mg Pincus Sanes, MD Taking Active            Med Note Marilu Favre May 25, 2023  3:50 PM) 05/25/23: Declines medication review during [facility] call today  allopurinol (ZYLOPRIM) 300 MG tablet [id number] No TAKE 1 TABLET(300 MG) BY MOUTH DAILY AS NEEDED FOR GOUT FLARE Burns, Bobette Mo, MD Taking Active   amoxicillin (AMOXIL) 500 MG tablet [id number] No GIVE 4 TABS (2000MG ) BY MOUTH 1H BEFORE APPT Pincus Sanes, MD Taking Active   ANTI-DIARRHEAL 2 MG tablet [id number] No GIVE 1 TAB BY MOUTH 4 TIMES DAILY AS NEEDED FOR DIARRHEA OR LOOSE STOOLS Burns, Bobette Mo, MD Taking Active   apixaban (ELIQUIS) 2.5 MG TABS tablet [id number] No GIVE 1 TAB BY MOUTH TWICE DAILY Burns, Bobette Mo, MD Taking Active   Bismuth Subsalicylate (PEPTO BISMOL PO) [id number] No Take by mouth as needed. Prn as needed for upset stomach [provider] Taking Active   Cholecalciferol (VITAMIN D3) 50 MCG (2000 UT) capsule [id number] No GIVE 1 CAP BY MOUTH ONCE DAILY Burns, Bobette Mo, MD Taking Active   furosemide (LASIX) 20 MG tablet [id number] No Take 1 tablet (20 mg total) by mouth daily as needed for fluid. Dyann Kief, PA-C Taking Active   iron polysaccharides (NIFEREX) 150 MG capsule [id number] No Take 1 capsule (150 mg total) by mouth daily. Pincus Sanes, MD Taking Active   metoprolol tartrate (LOPRESSOR) 25 MG tablet [id number] No Take 0.5 tablets (12.5 mg total) by mouth 2 (two) times daily. Geni Bers,  Tarri Abernethy, PA-C Taking Active   oxybutynin (DITROPAN) 5 MG tablet [id number] No Take 2 tablets (10 mg total) by mouth every 8 (eight) hours as needed for bladder spasms. Rodolph Bong, MD Taking Active   potassium chloride (KLOR-CON M) 10 MEQ tablet [id number] No Take 1 tablet (10 mEq total) by mouth daily as needed (ONLY WHEN TAKING FUROSEMIDE). Take 1 tablet by mouth daily (with Furosemide)  Dyann Kief, PA-C Taking Active   pravastatin (PRAVACHOL) 40 MG tablet [id number] No GIVE 1 TAB BY MOUTH ONCE DAILY Burns, Bobette Mo, MD Taking Active   QUEtiapine (SEROQUEL) 25 MG tablet [id number] No GIVE 1 TAB BY MOUTH AT BEDTIME Burns, Bobette Mo, MD Taking Active   vitamin B-12 (CYANOCOBALAMIN) 500 MCG tablet [id number] No GIVE 1 TAB BY MOUTH ONCE DAILY Burns, Bobette Mo, MD Taking Active            Home Care and Equipment/Supplies: Were Home Health Services Ordered?: No Any new equipment or medical supplies ordered?: No  Functional Questionnaire: Do you need assistance with bathing/showering or dressing?: No Do you need assistance with meal preparation?: Yes ([facility] prepares meals) Do you need assistance with eating?: No Do you have difficulty maintaining continence: No Do you need assistance with getting out of bed/getting out of a chair/moving?: No Do you have difficulty managing or taking your medications?: No  Follow up appointments reviewed: PCP Follow-up appointment confirmed?: Yes Date of PCP follow-up appointment?: 06/01/23 Follow-up Provider: PCP Specialist Hospital Follow-up appointment confirmed?: NA (verified not indicated per hospital discharging provider discharge notes) Do you need transportation to your follow-up appointment?: No Do you understand care options if your condition(s) worsen?: Yes-patient verbalized understanding  SDOH Interventions Today    Flowsheet Row Most Recent Value  SDOH Interventions   Food Insecurity Interventions Intervention Not Indicated  [Reports ALF facility assist with meal preparation]  Transportation Interventions Intervention Not Indicated  [reports sons provide transportation now that he is no longer driving and living in assisted living facility]      TOC Interventions Today    Flowsheet Row Most Recent Value  TOC Interventions   TOC Interventions Discussed/Reviewed TOC Interventions Discussed, Post op wound/incision care   [Patient declines need for ongoing/ further care coordination outreach,  no care coordination needs identified at time of TOC call today- reports has "plenty of help" at new ALF where he resides]      Interventions Today    Flowsheet Row Most Recent Value  Chronic Disease   Chronic disease during today's visit Other  [hematuria,  skin tear]  General Interventions   General Interventions Discussed/Reviewed General Interventions Discussed, Durable Medical Equipment (DME), Doctor Visits  Doctor Visits Discussed/Reviewed Doctor Visits Reviewed, PCP  Durable Medical Equipment (DME) Val Riles currently requiring/ using assistive devices - walker]  PCP/Specialist Visits Compliance with follow-up visit  Education Interventions   Education Provided Provided Education  Provided Verbal Education On Other  [fall prevention education]  Nutrition Interventions   Nutrition Discussed/Reviewed Nutrition Discussed  Pharmacy Interventions   Pharmacy Dicussed/Reviewed Pharmacy Topics Discussed  Safety Interventions   Safety Discussed/Reviewed Safety Discussed, Fall Risk      Caryl Pina, RN, BSN, CCRN Alumnus RN CM Care Coordination/ Transition of Care- [facility] Care Management [phone]: direct office

## 2023-05-26 DIAGNOSIS — M810 Age-related osteoporosis without current pathological fracture: Secondary | ICD-10-CM | POA: Diagnosis not present

## 2023-05-26 DIAGNOSIS — R2681 Unsteadiness on feet: Secondary | ICD-10-CM | POA: Diagnosis not present

## 2023-05-26 DIAGNOSIS — R2689 Other abnormalities of gait and mobility: Secondary | ICD-10-CM | POA: Diagnosis not present

## 2023-05-29 ENCOUNTER — Telehealth: Payer: Self-pay | Admitting: Internal Medicine

## 2023-05-29 DIAGNOSIS — M109 Gout, unspecified: Secondary | ICD-10-CM

## 2023-05-29 NOTE — Telephone Encounter (Signed)
[facility] assisted [facility] called and asked if a DC order could be sent regarding the allopurinol (ZYLOPRIM) 300 MG tablet, as it should be PRN.

## 2023-05-29 NOTE — Telephone Encounter (Signed)
DMe printed

## 2023-05-29 NOTE — Telephone Encounter (Signed)
Faxed today to [phone] and fax conformation received.

## 2023-05-31 ENCOUNTER — Encounter: Payer: Self-pay | Admitting: Internal Medicine

## 2023-05-31 NOTE — Progress Notes (Unsigned)
Subjective:    Patient ID: Paul Mays, male    DOB: 12/04/1927, 89 y.o.   MRN: [id number]     HPI Paul Mays is here for follow up of his chronic medical problems.    Medications and allergies reviewed with patient and updated if appropriate.  Current Outpatient Medications on File Prior to Visit  Medication Sig Dispense Refill   acetaminophen (TYLENOL) 325 MG tablet Take 2 tablets (650 mg total) by mouth 2 (two) times daily. May take up to 2 Tabs Q 6 hr prn for pain - MDD 3000 mg 240 tablet 11   allopurinol (ZYLOPRIM) 300 MG tablet TAKE 1 TABLET(300 MG) BY MOUTH DAILY AS NEEDED FOR GOUT FLARE 90 tablet 0   amoxicillin (AMOXIL) 500 MG tablet GIVE 4 TABS (2000MG ) BY MOUTH 1H BEFORE APPT 4 tablet 2   ANTI-DIARRHEAL 2 MG tablet GIVE 1 TAB BY MOUTH 4 TIMES DAILY AS NEEDED FOR DIARRHEA OR LOOSE STOOLS 30 tablet 11   apixaban (ELIQUIS) 2.5 MG TABS tablet GIVE 1 TAB BY MOUTH TWICE DAILY 60 tablet 5   Bismuth Subsalicylate (PEPTO BISMOL PO) Take by mouth as needed. Prn as needed for upset stomach     Cholecalciferol (VITAMIN D3) 50 MCG (2000 UT) capsule GIVE 1 CAP BY MOUTH ONCE DAILY 30 capsule 11   furosemide (LASIX) 20 MG tablet Take 1 tablet (20 mg total) by mouth daily as needed for fluid. 90 tablet 3   iron polysaccharides (NIFEREX) 150 MG capsule Take 1 capsule (150 mg total) by mouth daily. 30 capsule 5   metoprolol tartrate (LOPRESSOR) 25 MG tablet Take 0.5 tablets (12.5 mg total) by mouth 2 (two) times daily. 30 tablet 11   oxybutynin (DITROPAN) 5 MG tablet Take 2 tablets (10 mg total) by mouth every 8 (eight) hours as needed for bladder spasms. 20 tablet 0   potassium chloride (KLOR-CON M) 10 MEQ tablet Take 1 tablet (10 mEq total) by mouth daily as needed (ONLY WHEN TAKING FUROSEMIDE). Take 1 tablet by mouth daily (with Furosemide) 90 tablet 3   pravastatin (PRAVACHOL) 40 MG tablet GIVE 1 TAB BY MOUTH ONCE DAILY 30 tablet 11   QUEtiapine (SEROQUEL) 25 MG tablet GIVE 1 TAB BY  MOUTH AT BEDTIME 30 tablet 5   vitamin B-12 (CYANOCOBALAMIN) 500 MCG tablet GIVE 1 TAB BY MOUTH ONCE DAILY 30 tablet 11   No current facility-administered medications on file prior to visit.     Review of Systems     Objective:  There were no vitals filed for this visit. BP Readings from Last 3 Encounters:  05/19/23 (!) 154/62  05/18/23 (!) 160/78  05/05/23 116/60   Wt Readings from Last 3 Encounters:  05/19/23 123 lb (55.8 kg)  05/18/23 119 lb 0.8 oz (54 kg)  05/05/23 121 lb 3.2 oz (55 kg)   There is no height or weight on file to calculate BMI.    Physical Exam     Lab Results  Component Value Date   WBC 5.8 05/19/2023   HGB 10.3 (L) 05/19/2023   HCT 32.6 (L) 05/19/2023   PLT 128.0 (L) 05/19/2023   GLUCOSE 123 (H) 05/05/2023   CHOL 149 12/09/2017   TRIG 57.0 12/09/2017   HDL 72.30 12/09/2017   LDLCALC 65 12/09/2017   ALT 10 03/13/2023   AST 16 03/13/2023   NA 142 05/05/2023   K 4.8 05/05/2023   CL 105 05/05/2023   CREATININE 1.26 05/05/2023   BUN 23  05/05/2023   CO2 24 05/05/2023   TSH 2.34 02/19/2021   PSA 0.00 (L) 02/19/2021   INR 1.3 (H) 01/25/2023   HGBA1C 4.9 11/08/2021     Assessment & Plan:    See Problem List for Assessment and Plan of chronic medical problems.

## 2023-05-31 NOTE — Patient Instructions (Addendum)
      y Medications changes include :   none       Return in about 6 months (around 12/02/2023) for follow up.

## 2023-06-01 ENCOUNTER — Ambulatory Visit (INDEPENDENT_AMBULATORY_CARE_PROVIDER_SITE_OTHER): Payer: Medicare Other | Admitting: Internal Medicine

## 2023-06-01 VITALS — BP 130/82 | HR 61 | Temp 98.0°F | Ht 60.0 in | Wt 127.0 lb

## 2023-06-01 DIAGNOSIS — I480 Paroxysmal atrial fibrillation: Secondary | ICD-10-CM

## 2023-06-01 DIAGNOSIS — I10 Essential (primary) hypertension: Secondary | ICD-10-CM | POA: Diagnosis not present

## 2023-06-01 DIAGNOSIS — N1831 Chronic kidney disease, stage 3a: Secondary | ICD-10-CM

## 2023-06-01 DIAGNOSIS — I5032 Chronic diastolic (congestive) heart failure: Secondary | ICD-10-CM | POA: Diagnosis not present

## 2023-06-01 DIAGNOSIS — R451 Restlessness and agitation: Secondary | ICD-10-CM

## 2023-06-01 DIAGNOSIS — R600 Localized edema: Secondary | ICD-10-CM

## 2023-06-01 DIAGNOSIS — M109 Gout, unspecified: Secondary | ICD-10-CM

## 2023-06-01 DIAGNOSIS — I25810 Atherosclerosis of coronary artery bypass graft(s) without angina pectoris: Secondary | ICD-10-CM

## 2023-06-01 DIAGNOSIS — E7849 Other hyperlipidemia: Secondary | ICD-10-CM

## 2023-06-01 NOTE — Assessment & Plan Note (Addendum)
Chronic BP controlled Continue metoprolol 12.5 mg twice daily

## 2023-06-01 NOTE — Assessment & Plan Note (Addendum)
Chronic Has some mild edema which is chronic, but denies any shortness of breath more than chronic SOB with exertion Looks euvolemic Continue Lasix 20 mg daily, metoprolol 25 mg twice daily

## 2023-06-01 NOTE — Assessment & Plan Note (Addendum)
Chronic Continue pravastatin 40 mg daily 

## 2023-06-01 NOTE — Assessment & Plan Note (Signed)
Started having some agitation while hospitalized and was started on Seroquel 25 mg nightly This was stopped for some reason few nights ago which could be the behavior that his son has noticed Continue Seroquel 25 mg nightly Discussed neurology referral for further evaluation of possible dementia-his son will think about this

## 2023-06-01 NOTE — Assessment & Plan Note (Addendum)
Taking allopurinol 300 mg a day Continue

## 2023-06-01 NOTE — Assessment & Plan Note (Addendum)
Chronic Improved with Lasix as needed only

## 2023-06-01 NOTE — Assessment & Plan Note (Signed)
Chronic Following with cardiology On Eliquis 2.5 mg twice daily, metoprolol 12.5 mg twice daily, pravastatin 40 mg daily

## 2023-06-01 NOTE — Assessment & Plan Note (Addendum)
Chronic controlled Bilateral edema lower extremities Continue Lasix 20 mg daily as needed

## 2023-06-01 NOTE — Assessment & Plan Note (Signed)
Chronic On Eliquis  2.5 mg twice daily On metoprolol 25 mg twice daily Following with cardiology

## 2023-06-03 DIAGNOSIS — M810 Age-related osteoporosis without current pathological fracture: Secondary | ICD-10-CM | POA: Diagnosis not present

## 2023-06-03 DIAGNOSIS — R2681 Unsteadiness on feet: Secondary | ICD-10-CM | POA: Diagnosis not present

## 2023-06-03 DIAGNOSIS — R2689 Other abnormalities of gait and mobility: Secondary | ICD-10-CM | POA: Diagnosis not present

## 2023-06-11 DIAGNOSIS — R2681 Unsteadiness on feet: Secondary | ICD-10-CM | POA: Diagnosis not present

## 2023-06-11 DIAGNOSIS — M810 Age-related osteoporosis without current pathological fracture: Secondary | ICD-10-CM | POA: Diagnosis not present

## 2023-06-11 DIAGNOSIS — R2689 Other abnormalities of gait and mobility: Secondary | ICD-10-CM | POA: Diagnosis not present

## 2023-06-19 DIAGNOSIS — Z515 Encounter for palliative care: Secondary | ICD-10-CM | POA: Diagnosis not present

## 2023-06-22 ENCOUNTER — Telehealth: Payer: Self-pay | Admitting: Internal Medicine

## 2023-06-22 NOTE — Telephone Encounter (Signed)
A representative from [facility] called and said they have faxed over a PT plan of care to our office, most recently 06/18/23. They wanted to make sure it has been received. They also re-faxed it today. Best callback is [phone], extension [phone].

## 2023-06-23 DIAGNOSIS — M810 Age-related osteoporosis without current pathological fracture: Secondary | ICD-10-CM | POA: Diagnosis not present

## 2023-06-23 DIAGNOSIS — R2689 Other abnormalities of gait and mobility: Secondary | ICD-10-CM | POA: Diagnosis not present

## 2023-06-23 DIAGNOSIS — R2681 Unsteadiness on feet: Secondary | ICD-10-CM | POA: Diagnosis not present

## 2023-06-23 NOTE — Telephone Encounter (Signed)
Still awaiting form to be recieved

## 2023-06-29 DIAGNOSIS — M810 Age-related osteoporosis without current pathological fracture: Secondary | ICD-10-CM | POA: Diagnosis not present

## 2023-06-29 DIAGNOSIS — R2681 Unsteadiness on feet: Secondary | ICD-10-CM | POA: Diagnosis not present

## 2023-06-29 DIAGNOSIS — R2689 Other abnormalities of gait and mobility: Secondary | ICD-10-CM | POA: Diagnosis not present

## 2023-06-30 ENCOUNTER — Other Ambulatory Visit: Payer: Self-pay | Admitting: Internal Medicine

## 2023-07-02 DIAGNOSIS — N401 Enlarged prostate with lower urinary tract symptoms: Secondary | ICD-10-CM | POA: Diagnosis not present

## 2023-07-02 DIAGNOSIS — R350 Frequency of micturition: Secondary | ICD-10-CM | POA: Diagnosis not present

## 2023-07-02 DIAGNOSIS — R310 Gross hematuria: Secondary | ICD-10-CM | POA: Diagnosis not present

## 2023-07-02 DIAGNOSIS — R338 Other retention of urine: Secondary | ICD-10-CM | POA: Diagnosis not present

## 2023-07-04 DIAGNOSIS — R2689 Other abnormalities of gait and mobility: Secondary | ICD-10-CM | POA: Diagnosis not present

## 2023-07-04 DIAGNOSIS — M810 Age-related osteoporosis without current pathological fracture: Secondary | ICD-10-CM | POA: Diagnosis not present

## 2023-07-04 DIAGNOSIS — R2681 Unsteadiness on feet: Secondary | ICD-10-CM | POA: Diagnosis not present

## 2023-07-13 DIAGNOSIS — Z515 Encounter for palliative care: Secondary | ICD-10-CM | POA: Diagnosis not present

## 2023-07-14 DIAGNOSIS — M810 Age-related osteoporosis without current pathological fracture: Secondary | ICD-10-CM | POA: Diagnosis not present

## 2023-07-14 DIAGNOSIS — R2681 Unsteadiness on feet: Secondary | ICD-10-CM | POA: Diagnosis not present

## 2023-07-14 DIAGNOSIS — R2689 Other abnormalities of gait and mobility: Secondary | ICD-10-CM | POA: Diagnosis not present

## 2023-07-21 DIAGNOSIS — N2581 Secondary hyperparathyroidism of renal origin: Secondary | ICD-10-CM | POA: Diagnosis not present

## 2023-07-21 DIAGNOSIS — I129 Hypertensive chronic kidney disease with stage 1 through stage 4 chronic kidney disease, or unspecified chronic kidney disease: Secondary | ICD-10-CM | POA: Diagnosis not present

## 2023-07-21 DIAGNOSIS — I5030 Unspecified diastolic (congestive) heart failure: Secondary | ICD-10-CM | POA: Diagnosis not present

## 2023-07-21 DIAGNOSIS — N183 Chronic kidney disease, stage 3 unspecified: Secondary | ICD-10-CM | POA: Diagnosis not present

## 2023-07-21 DIAGNOSIS — D631 Anemia in chronic kidney disease: Secondary | ICD-10-CM | POA: Diagnosis not present

## 2023-07-21 DIAGNOSIS — N179 Acute kidney failure, unspecified: Secondary | ICD-10-CM | POA: Diagnosis not present

## 2023-07-22 DIAGNOSIS — R2681 Unsteadiness on feet: Secondary | ICD-10-CM | POA: Diagnosis not present

## 2023-07-22 DIAGNOSIS — R2689 Other abnormalities of gait and mobility: Secondary | ICD-10-CM | POA: Diagnosis not present

## 2023-07-22 DIAGNOSIS — M810 Age-related osteoporosis without current pathological fracture: Secondary | ICD-10-CM | POA: Diagnosis not present

## 2023-07-23 ENCOUNTER — Encounter: Payer: Self-pay | Admitting: Cardiovascular Disease

## 2023-07-23 ENCOUNTER — Telehealth: Payer: Self-pay | Admitting: Internal Medicine

## 2023-07-23 ENCOUNTER — Ambulatory Visit: Payer: Medicare Other | Attending: Cardiovascular Disease | Admitting: Cardiovascular Disease

## 2023-07-23 VITALS — BP 130/82 | HR 63 | Ht 66.0 in | Wt 122.0 lb

## 2023-07-23 DIAGNOSIS — Z952 Presence of prosthetic heart valve: Secondary | ICD-10-CM | POA: Diagnosis not present

## 2023-07-23 DIAGNOSIS — I5032 Chronic diastolic (congestive) heart failure: Secondary | ICD-10-CM | POA: Diagnosis not present

## 2023-07-23 DIAGNOSIS — I10 Essential (primary) hypertension: Secondary | ICD-10-CM | POA: Diagnosis not present

## 2023-07-23 DIAGNOSIS — I4819 Other persistent atrial fibrillation: Secondary | ICD-10-CM | POA: Diagnosis not present

## 2023-07-23 NOTE — Assessment & Plan Note (Signed)
Blood pressure well controlled

## 2023-07-23 NOTE — Patient Instructions (Signed)
Medication Instructions:  Your physician recommends that you continue on your current medications as directed. Please refer to the Current Medication list given to you today.  *If you need a refill on your cardiac medications before your next appointment, please call your pharmacy*  Testing/Procedures: ECHO (in 6 months, prior to visit) Your physician has requested that you have an echocardiogram. Echocardiography is a painless test that uses sound waves to create images of your heart. It provides your doctor with information about the size and shape of your heart and how well your heart's chambers and valves are working. This procedure takes approximately one hour. There are no restrictions for this procedure. Please do NOT wear cologne, perfume, aftershave, or lotions (deodorant is allowed). Please arrive 15 minutes prior to your appointment time.  Follow-Up: At [facility], you and your health needs are our priority.  As part of our continuing mission to provide you with exceptional heart care, we have created designated Provider Care Teams.  These Care Teams include your primary Cardiologist (physician) and Advanced Practice Providers (APPs -  Physician Assistants and Nurse Practitioners) who all work together to provide you with the care you need, when you need it.  We recommend signing up for the patient portal called "MyChart".  Sign up information is provided on this After Visit Summary.  MyChart is used to connect with patients for Virtual Visits (Telemedicine).  Patients are able to view lab/test results, encounter notes, upcoming appointments, etc.  Non-urgent messages can be sent to your provider as well.   To learn more about what you can do with MyChart, go to [email or web address].    Your next appointment:   6 month(s)  Provider:   Tonny Bollman, MD

## 2023-07-23 NOTE — Progress Notes (Signed)
Cardiology Office Note:    Date:  07/23/2023   ID:  Paul Mays, DOB July 06, 1928, MRN [id number]  PCP:  Pincus Sanes, MD   [facility] Providers Cardiologist:  Tonny Bollman, MD     Referring MD: Pincus Sanes, MD   Chief Complaint  Patient presents with   Follow-up    S/P TAVR    History of Present Illness:    Paul Mays is a 89 y.o. male presenting for follow-up evaluation.  He is here with his son today.  He has a history of coronary artery disease and aortic stenosis.  He underwent TAVR in February 2023.  When I saw him in January 2024 he was found to have fluid overload and new onset atrial fibrillation.  He has had severe LVH and there was concern about amyloid heart disease but at his very advanced age we have elected to treat him conservatively.  He has had normal function of his TAVR bioprosthesis.  He developed hypotension and dizziness with diuretic therapy and this was discontinued.  He is now just taking Lasix as needed but has not used it in some time.  He has had no more problems with fluid.  He has lost weight but reports a pretty good appetite.  He denies any shortness of breath, edema, orthopnea, or PND.  He is having no chest pain.   Current Medications: Current Meds  Medication Sig   acetaminophen (TYLENOL) 325 MG tablet Take 2 tablets (650 mg total) by mouth 2 (two) times daily. May take up to 2 Tabs Q 6 hr prn for pain - MDD 3000 mg   allopurinol (ZYLOPRIM) 300 MG tablet TAKE 1 TABLET(300 MG) BY MOUTH DAILY AS NEEDED FOR GOUT FLARE   amoxicillin (AMOXIL) 500 MG tablet GIVE 4 TABS (2000MG ) BY MOUTH 1H BEFORE APPT   ANTI-DIARRHEAL 2 MG tablet GIVE 1 TAB BY MOUTH 4 TIMES DAILY AS NEEDED FOR DIARRHEA OR LOOSE STOOLS   apixaban (ELIQUIS) 2.5 MG TABS tablet GIVE 1 TAB BY MOUTH TWICE DAILY   Bismuth Subsalicylate (PEPTO BISMOL PO) Take by mouth as needed. Prn as needed for upset stomach   Cholecalciferol (VITAMIN D3) 50 MCG (2000 UT) capsule GIVE 1  CAP BY MOUTH ONCE DAILY   furosemide (LASIX) 20 MG tablet Take 1 tablet (20 mg total) by mouth daily as needed for fluid.   iron polysaccharides (NIFEREX) 150 MG capsule Take 1 capsule (150 mg total) by mouth daily.   metoprolol tartrate (LOPRESSOR) 25 MG tablet Take 0.5 tablets (12.5 mg total) by mouth 2 (two) times daily.   oxybutynin (DITROPAN) 5 MG tablet Take 2 tablets (10 mg total) by mouth every 8 (eight) hours as needed for bladder spasms.   potassium chloride (KLOR-CON M) 10 MEQ tablet Take 1 tablet (10 mEq total) by mouth daily as needed (ONLY WHEN TAKING FUROSEMIDE). Take 1 tablet by mouth daily (with Furosemide)   pravastatin (PRAVACHOL) 40 MG tablet GIVE 1 TAB BY MOUTH ONCE DAILY   vitamin B-12 (CYANOCOBALAMIN) 500 MCG tablet GIVE 1 TAB BY MOUTH ONCE DAILY   [DISCONTINUED] QUEtiapine (SEROQUEL) 25 MG tablet GIVE 1 TAB BY MOUTH AT BEDTIME     Allergies:   Lisinopril and Spironolactone   ROS:   Please see the history of present illness.    All other systems reviewed and are negative.  EKGs/Labs/Other Studies Reviewed:    The following studies were reviewed today: Cardiac Studies & Procedures   CARDIAC CATHETERIZATION  CARDIAC CATHETERIZATION 10/02/2021  Narrative 1.  Severe single-vessel coronary artery disease with chronic occlusion of the RCA, collateralized from the left coronary artery. 2.  Calcified, restricted aortic valve on plain fluoroscopy, difficult to cross with a straight wire, but low mean transaortic gradient of only 8 mmHg. 3.  Severe pulmonary hypertension with pulmonary artery pressure of 78/29, mean 48 mmHg. 4.  Transpulmonary gradient 23 mmHg, PVR 4.6 Wood units.  Recommendations: Further valve team review to consider TAVR in this patient with what appears to be severe aortic stenosis by noninvasive assessment, medical therapy for coronary artery disease, consider restrictive cardiomyopathy.  Findings Coronary Findings Diagnostic  Dominance:  Right  Left Main There is mild diffuse disease throughout the vessel.  Left Anterior Descending Prox LAD to Mid LAD lesion is 50% stenosed. The lesion is calcified.  Left Circumflex There is mild diffuse disease throughout the vessel.  Right Coronary Artery Prox RCA lesion is 100% stenosed.  Right Posterior Descending Artery Collaterals RPDA filled by collaterals from 1st Sept.  First Right Posterolateral Branch Collaterals 1st RPL filled by collaterals from 2nd Sept.  Intervention  No interventions have been documented.     ECHOCARDIOGRAM  ECHOCARDIOGRAM COMPLETE 10/28/2022  Narrative ECHOCARDIOGRAM REPORT    Patient Name:   Paul Mays Date of Exam: 10/28/2022 Medical Rec #:  [id number]      Height:       66.0 in Accession #:    [id number]     Weight:       139.2 lb Date of Birth:  1928-02-01       BSA:          1.714 m Patient Age:    89 years       BP:           114/65 mmHg Patient Gender: M              HR:           50 bpm. Exam Location:  [address]  Procedure: 2D Echo, 3D Echo, Cardiac Doppler and Color Doppler  Indications:    Z95.2 Status Post TAVR  History:        Patient has prior history of Echocardiogram examinations, most recent 12/13/2021. CAD, Pulmonary HTN, Aortic Valve Disease, Signs/Symptoms:Fatigue and Edema; Risk Factors:Hypertension, Dyslipidemia and Sleep Apnea. Aortic Valve: 23 mm Sapien prosthetic, stented (TAVR) valve is present in the aortic position. Procedure Date: 11/12/21.  Sonographer:    Farrel Conners RDCS Referring Phys: Bobette Mo BURNS  IMPRESSIONS   1. Left ventricular ejection fraction, by estimation, is 65 to 70%. Left ventricular ejection fraction by 3D volume is 72 %. The left ventricle has normal function. The left ventricle has no regional wall motion abnormalities. There is severe left ventricular hypertrophy. Left ventricular diastolic parameters are indeterminate. Elevated left ventricular end-diastolic  pressure. The E/e' is 38. 2. Right ventricular systolic function is mildly reduced. The right ventricular size is moderately enlarged. There is severely elevated pulmonary artery systolic pressure. The estimated right ventricular systolic pressure is 85.2 mmHg. 3. Left atrial size was severely dilated. 4. Right atrial size was moderately dilated. 5. The mitral valve is degenerative. Moderate to severe mitral valve regurgitation. No evidence of mitral stenosis. The mean mitral valve gradient is 3.8 mmHg with average heart rate of 58 bpm. 6. Tricuspid valve regurgitation is moderate to severe. 7. The aortic valve has been repaired/replaced. Aortic valve regurgitation is not visualized. No aortic stenosis is present. There is a  Cardiology Office Note:    Date:  07/23/2023   ID:  Paul Mays, DOB July 06, 1928, MRN [id number]  PCP:  Pincus Sanes, MD   [facility] Providers Cardiologist:  Tonny Bollman, MD     Referring MD: Pincus Sanes, MD   Chief Complaint  Patient presents with   Follow-up    S/P TAVR    History of Present Illness:    Paul Mays is a 89 y.o. male presenting for follow-up evaluation.  He is here with his son today.  He has a history of coronary artery disease and aortic stenosis.  He underwent TAVR in February 2023.  When I saw him in January 2024 he was found to have fluid overload and new onset atrial fibrillation.  He has had severe LVH and there was concern about amyloid heart disease but at his very advanced age we have elected to treat him conservatively.  He has had normal function of his TAVR bioprosthesis.  He developed hypotension and dizziness with diuretic therapy and this was discontinued.  He is now just taking Lasix as needed but has not used it in some time.  He has had no more problems with fluid.  He has lost weight but reports a pretty good appetite.  He denies any shortness of breath, edema, orthopnea, or PND.  He is having no chest pain.   Current Medications: Current Meds  Medication Sig   acetaminophen (TYLENOL) 325 MG tablet Take 2 tablets (650 mg total) by mouth 2 (two) times daily. May take up to 2 Tabs Q 6 hr prn for pain - MDD 3000 mg   allopurinol (ZYLOPRIM) 300 MG tablet TAKE 1 TABLET(300 MG) BY MOUTH DAILY AS NEEDED FOR GOUT FLARE   amoxicillin (AMOXIL) 500 MG tablet GIVE 4 TABS (2000MG ) BY MOUTH 1H BEFORE APPT   ANTI-DIARRHEAL 2 MG tablet GIVE 1 TAB BY MOUTH 4 TIMES DAILY AS NEEDED FOR DIARRHEA OR LOOSE STOOLS   apixaban (ELIQUIS) 2.5 MG TABS tablet GIVE 1 TAB BY MOUTH TWICE DAILY   Bismuth Subsalicylate (PEPTO BISMOL PO) Take by mouth as needed. Prn as needed for upset stomach   Cholecalciferol (VITAMIN D3) 50 MCG (2000 UT) capsule GIVE 1  CAP BY MOUTH ONCE DAILY   furosemide (LASIX) 20 MG tablet Take 1 tablet (20 mg total) by mouth daily as needed for fluid.   iron polysaccharides (NIFEREX) 150 MG capsule Take 1 capsule (150 mg total) by mouth daily.   metoprolol tartrate (LOPRESSOR) 25 MG tablet Take 0.5 tablets (12.5 mg total) by mouth 2 (two) times daily.   oxybutynin (DITROPAN) 5 MG tablet Take 2 tablets (10 mg total) by mouth every 8 (eight) hours as needed for bladder spasms.   potassium chloride (KLOR-CON M) 10 MEQ tablet Take 1 tablet (10 mEq total) by mouth daily as needed (ONLY WHEN TAKING FUROSEMIDE). Take 1 tablet by mouth daily (with Furosemide)   pravastatin (PRAVACHOL) 40 MG tablet GIVE 1 TAB BY MOUTH ONCE DAILY   vitamin B-12 (CYANOCOBALAMIN) 500 MCG tablet GIVE 1 TAB BY MOUTH ONCE DAILY   [DISCONTINUED] QUEtiapine (SEROQUEL) 25 MG tablet GIVE 1 TAB BY MOUTH AT BEDTIME     Allergies:   Lisinopril and Spironolactone   ROS:   Please see the history of present illness.    All other systems reviewed and are negative.  EKGs/Labs/Other Studies Reviewed:    The following studies were reviewed today: Cardiac Studies & Procedures   CARDIAC CATHETERIZATION  CARDIAC CATHETERIZATION 10/02/2021  Narrative 1.  Severe single-vessel coronary artery disease with chronic occlusion of the RCA, collateralized from the left coronary artery. 2.  Calcified, restricted aortic valve on plain fluoroscopy, difficult to cross with a straight wire, but low mean transaortic gradient of only 8 mmHg. 3.  Severe pulmonary hypertension with pulmonary artery pressure of 78/29, mean 48 mmHg. 4.  Transpulmonary gradient 23 mmHg, PVR 4.6 Wood units.  Recommendations: Further valve team review to consider TAVR in this patient with what appears to be severe aortic stenosis by noninvasive assessment, medical therapy for coronary artery disease, consider restrictive cardiomyopathy.  Findings Coronary Findings Diagnostic  Dominance:  Right  Left Main There is mild diffuse disease throughout the vessel.  Left Anterior Descending Prox LAD to Mid LAD lesion is 50% stenosed. The lesion is calcified.  Left Circumflex There is mild diffuse disease throughout the vessel.  Right Coronary Artery Prox RCA lesion is 100% stenosed.  Right Posterior Descending Artery Collaterals RPDA filled by collaterals from 1st Sept.  First Right Posterolateral Branch Collaterals 1st RPL filled by collaterals from 2nd Sept.  Intervention  No interventions have been documented.     ECHOCARDIOGRAM  ECHOCARDIOGRAM COMPLETE 10/28/2022  Narrative ECHOCARDIOGRAM REPORT    Patient Name:   Paul Mays Date of Exam: 10/28/2022 Medical Rec #:  [id number]      Height:       66.0 in Accession #:    [id number]     Weight:       139.2 lb Date of Birth:  1928-02-01       BSA:          1.714 m Patient Age:    89 years       BP:           114/65 mmHg Patient Gender: M              HR:           50 bpm. Exam Location:  [address]  Procedure: 2D Echo, 3D Echo, Cardiac Doppler and Color Doppler  Indications:    Z95.2 Status Post TAVR  History:        Patient has prior history of Echocardiogram examinations, most recent 12/13/2021. CAD, Pulmonary HTN, Aortic Valve Disease, Signs/Symptoms:Fatigue and Edema; Risk Factors:Hypertension, Dyslipidemia and Sleep Apnea. Aortic Valve: 23 mm Sapien prosthetic, stented (TAVR) valve is present in the aortic position. Procedure Date: 11/12/21.  Sonographer:    Farrel Conners RDCS Referring Phys: Bobette Mo BURNS  IMPRESSIONS   1. Left ventricular ejection fraction, by estimation, is 65 to 70%. Left ventricular ejection fraction by 3D volume is 72 %. The left ventricle has normal function. The left ventricle has no regional wall motion abnormalities. There is severe left ventricular hypertrophy. Left ventricular diastolic parameters are indeterminate. Elevated left ventricular end-diastolic  pressure. The E/e' is 38. 2. Right ventricular systolic function is mildly reduced. The right ventricular size is moderately enlarged. There is severely elevated pulmonary artery systolic pressure. The estimated right ventricular systolic pressure is 85.2 mmHg. 3. Left atrial size was severely dilated. 4. Right atrial size was moderately dilated. 5. The mitral valve is degenerative. Moderate to severe mitral valve regurgitation. No evidence of mitral stenosis. The mean mitral valve gradient is 3.8 mmHg with average heart rate of 58 bpm. 6. Tricuspid valve regurgitation is moderate to severe. 7. The aortic valve has been repaired/replaced. Aortic valve regurgitation is not visualized. No aortic stenosis is present. There is a  Cardiology Office Note:    Date:  07/23/2023   ID:  Paul Mays, DOB July 06, 1928, MRN [id number]  PCP:  Pincus Sanes, MD   [facility] Providers Cardiologist:  Tonny Bollman, MD     Referring MD: Pincus Sanes, MD   Chief Complaint  Patient presents with   Follow-up    S/P TAVR    History of Present Illness:    Paul Mays is a 89 y.o. male presenting for follow-up evaluation.  He is here with his son today.  He has a history of coronary artery disease and aortic stenosis.  He underwent TAVR in February 2023.  When I saw him in January 2024 he was found to have fluid overload and new onset atrial fibrillation.  He has had severe LVH and there was concern about amyloid heart disease but at his very advanced age we have elected to treat him conservatively.  He has had normal function of his TAVR bioprosthesis.  He developed hypotension and dizziness with diuretic therapy and this was discontinued.  He is now just taking Lasix as needed but has not used it in some time.  He has had no more problems with fluid.  He has lost weight but reports a pretty good appetite.  He denies any shortness of breath, edema, orthopnea, or PND.  He is having no chest pain.   Current Medications: Current Meds  Medication Sig   acetaminophen (TYLENOL) 325 MG tablet Take 2 tablets (650 mg total) by mouth 2 (two) times daily. May take up to 2 Tabs Q 6 hr prn for pain - MDD 3000 mg   allopurinol (ZYLOPRIM) 300 MG tablet TAKE 1 TABLET(300 MG) BY MOUTH DAILY AS NEEDED FOR GOUT FLARE   amoxicillin (AMOXIL) 500 MG tablet GIVE 4 TABS (2000MG ) BY MOUTH 1H BEFORE APPT   ANTI-DIARRHEAL 2 MG tablet GIVE 1 TAB BY MOUTH 4 TIMES DAILY AS NEEDED FOR DIARRHEA OR LOOSE STOOLS   apixaban (ELIQUIS) 2.5 MG TABS tablet GIVE 1 TAB BY MOUTH TWICE DAILY   Bismuth Subsalicylate (PEPTO BISMOL PO) Take by mouth as needed. Prn as needed for upset stomach   Cholecalciferol (VITAMIN D3) 50 MCG (2000 UT) capsule GIVE 1  CAP BY MOUTH ONCE DAILY   furosemide (LASIX) 20 MG tablet Take 1 tablet (20 mg total) by mouth daily as needed for fluid.   iron polysaccharides (NIFEREX) 150 MG capsule Take 1 capsule (150 mg total) by mouth daily.   metoprolol tartrate (LOPRESSOR) 25 MG tablet Take 0.5 tablets (12.5 mg total) by mouth 2 (two) times daily.   oxybutynin (DITROPAN) 5 MG tablet Take 2 tablets (10 mg total) by mouth every 8 (eight) hours as needed for bladder spasms.   potassium chloride (KLOR-CON M) 10 MEQ tablet Take 1 tablet (10 mEq total) by mouth daily as needed (ONLY WHEN TAKING FUROSEMIDE). Take 1 tablet by mouth daily (with Furosemide)   pravastatin (PRAVACHOL) 40 MG tablet GIVE 1 TAB BY MOUTH ONCE DAILY   vitamin B-12 (CYANOCOBALAMIN) 500 MCG tablet GIVE 1 TAB BY MOUTH ONCE DAILY   [DISCONTINUED] QUEtiapine (SEROQUEL) 25 MG tablet GIVE 1 TAB BY MOUTH AT BEDTIME     Allergies:   Lisinopril and Spironolactone   ROS:   Please see the history of present illness.    All other systems reviewed and are negative.  EKGs/Labs/Other Studies Reviewed:    The following studies were reviewed today: Cardiac Studies & Procedures   CARDIAC CATHETERIZATION  CARDIAC CATHETERIZATION 10/02/2021  Narrative 1.  Severe single-vessel coronary artery disease with chronic occlusion of the RCA, collateralized from the left coronary artery. 2.  Calcified, restricted aortic valve on plain fluoroscopy, difficult to cross with a straight wire, but low mean transaortic gradient of only 8 mmHg. 3.  Severe pulmonary hypertension with pulmonary artery pressure of 78/29, mean 48 mmHg. 4.  Transpulmonary gradient 23 mmHg, PVR 4.6 Wood units.  Recommendations: Further valve team review to consider TAVR in this patient with what appears to be severe aortic stenosis by noninvasive assessment, medical therapy for coronary artery disease, consider restrictive cardiomyopathy.  Findings Coronary Findings Diagnostic  Dominance:  Right  Left Main There is mild diffuse disease throughout the vessel.  Left Anterior Descending Prox LAD to Mid LAD lesion is 50% stenosed. The lesion is calcified.  Left Circumflex There is mild diffuse disease throughout the vessel.  Right Coronary Artery Prox RCA lesion is 100% stenosed.  Right Posterior Descending Artery Collaterals RPDA filled by collaterals from 1st Sept.  First Right Posterolateral Branch Collaterals 1st RPL filled by collaterals from 2nd Sept.  Intervention  No interventions have been documented.     ECHOCARDIOGRAM  ECHOCARDIOGRAM COMPLETE 10/28/2022  Narrative ECHOCARDIOGRAM REPORT    Patient Name:   Paul Mays Date of Exam: 10/28/2022 Medical Rec #:  [id number]      Height:       66.0 in Accession #:    [id number]     Weight:       139.2 lb Date of Birth:  1928-02-01       BSA:          1.714 m Patient Age:    89 years       BP:           114/65 mmHg Patient Gender: M              HR:           50 bpm. Exam Location:  [address]  Procedure: 2D Echo, 3D Echo, Cardiac Doppler and Color Doppler  Indications:    Z95.2 Status Post TAVR  History:        Patient has prior history of Echocardiogram examinations, most recent 12/13/2021. CAD, Pulmonary HTN, Aortic Valve Disease, Signs/Symptoms:Fatigue and Edema; Risk Factors:Hypertension, Dyslipidemia and Sleep Apnea. Aortic Valve: 23 mm Sapien prosthetic, stented (TAVR) valve is present in the aortic position. Procedure Date: 11/12/21.  Sonographer:    Farrel Conners RDCS Referring Phys: Bobette Mo BURNS  IMPRESSIONS   1. Left ventricular ejection fraction, by estimation, is 65 to 70%. Left ventricular ejection fraction by 3D volume is 72 %. The left ventricle has normal function. The left ventricle has no regional wall motion abnormalities. There is severe left ventricular hypertrophy. Left ventricular diastolic parameters are indeterminate. Elevated left ventricular end-diastolic  pressure. The E/e' is 38. 2. Right ventricular systolic function is mildly reduced. The right ventricular size is moderately enlarged. There is severely elevated pulmonary artery systolic pressure. The estimated right ventricular systolic pressure is 85.2 mmHg. 3. Left atrial size was severely dilated. 4. Right atrial size was moderately dilated. 5. The mitral valve is degenerative. Moderate to severe mitral valve regurgitation. No evidence of mitral stenosis. The mean mitral valve gradient is 3.8 mmHg with average heart rate of 58 bpm. 6. Tricuspid valve regurgitation is moderate to severe. 7. The aortic valve has been repaired/replaced. Aortic valve regurgitation is not visualized. No aortic stenosis is present. There is a  Cardiology Office Note:    Date:  07/23/2023   ID:  Paul Mays, DOB July 06, 1928, MRN [id number]  PCP:  Pincus Sanes, MD   [facility] Providers Cardiologist:  Tonny Bollman, MD     Referring MD: Pincus Sanes, MD   Chief Complaint  Patient presents with   Follow-up    S/P TAVR    History of Present Illness:    Paul Mays is a 89 y.o. male presenting for follow-up evaluation.  He is here with his son today.  He has a history of coronary artery disease and aortic stenosis.  He underwent TAVR in February 2023.  When I saw him in January 2024 he was found to have fluid overload and new onset atrial fibrillation.  He has had severe LVH and there was concern about amyloid heart disease but at his very advanced age we have elected to treat him conservatively.  He has had normal function of his TAVR bioprosthesis.  He developed hypotension and dizziness with diuretic therapy and this was discontinued.  He is now just taking Lasix as needed but has not used it in some time.  He has had no more problems with fluid.  He has lost weight but reports a pretty good appetite.  He denies any shortness of breath, edema, orthopnea, or PND.  He is having no chest pain.   Current Medications: Current Meds  Medication Sig   acetaminophen (TYLENOL) 325 MG tablet Take 2 tablets (650 mg total) by mouth 2 (two) times daily. May take up to 2 Tabs Q 6 hr prn for pain - MDD 3000 mg   allopurinol (ZYLOPRIM) 300 MG tablet TAKE 1 TABLET(300 MG) BY MOUTH DAILY AS NEEDED FOR GOUT FLARE   amoxicillin (AMOXIL) 500 MG tablet GIVE 4 TABS (2000MG ) BY MOUTH 1H BEFORE APPT   ANTI-DIARRHEAL 2 MG tablet GIVE 1 TAB BY MOUTH 4 TIMES DAILY AS NEEDED FOR DIARRHEA OR LOOSE STOOLS   apixaban (ELIQUIS) 2.5 MG TABS tablet GIVE 1 TAB BY MOUTH TWICE DAILY   Bismuth Subsalicylate (PEPTO BISMOL PO) Take by mouth as needed. Prn as needed for upset stomach   Cholecalciferol (VITAMIN D3) 50 MCG (2000 UT) capsule GIVE 1  CAP BY MOUTH ONCE DAILY   furosemide (LASIX) 20 MG tablet Take 1 tablet (20 mg total) by mouth daily as needed for fluid.   iron polysaccharides (NIFEREX) 150 MG capsule Take 1 capsule (150 mg total) by mouth daily.   metoprolol tartrate (LOPRESSOR) 25 MG tablet Take 0.5 tablets (12.5 mg total) by mouth 2 (two) times daily.   oxybutynin (DITROPAN) 5 MG tablet Take 2 tablets (10 mg total) by mouth every 8 (eight) hours as needed for bladder spasms.   potassium chloride (KLOR-CON M) 10 MEQ tablet Take 1 tablet (10 mEq total) by mouth daily as needed (ONLY WHEN TAKING FUROSEMIDE). Take 1 tablet by mouth daily (with Furosemide)   pravastatin (PRAVACHOL) 40 MG tablet GIVE 1 TAB BY MOUTH ONCE DAILY   vitamin B-12 (CYANOCOBALAMIN) 500 MCG tablet GIVE 1 TAB BY MOUTH ONCE DAILY   [DISCONTINUED] QUEtiapine (SEROQUEL) 25 MG tablet GIVE 1 TAB BY MOUTH AT BEDTIME     Allergies:   Lisinopril and Spironolactone   ROS:   Please see the history of present illness.    All other systems reviewed and are negative.  EKGs/Labs/Other Studies Reviewed:    The following studies were reviewed today: Cardiac Studies & Procedures   CARDIAC CATHETERIZATION

## 2023-07-23 NOTE — Assessment & Plan Note (Signed)
Appears euvolemic on exam. ?

## 2023-07-23 NOTE — Assessment & Plan Note (Signed)
Appears clinically stable.  Echo from January 2024 showed normal function of his TAVR bioprosthesis.  Will repeat an echocardiogram before his next follow-up visit in 6 months.  Continue to follow SBE prophylaxis when indicated.  Continue apixaban.

## 2023-07-30 ENCOUNTER — Encounter: Payer: Self-pay | Admitting: Nephrology

## 2023-08-04 ENCOUNTER — Other Ambulatory Visit: Payer: Self-pay | Admitting: Internal Medicine

## 2023-08-16 ENCOUNTER — Encounter: Payer: Self-pay | Admitting: Internal Medicine

## 2023-08-16 NOTE — Patient Instructions (Addendum)
      Medications changes include :   none -- please use samples of eliquis provided.    A referral was ordered for home health nursing.     Change bandage on left lower leg after he showers  - at least twice a week, maybe three times a week.    Return if symptoms worsen or fail to improve.

## 2023-08-16 NOTE — Progress Notes (Unsigned)
    Subjective:    Patient ID: Paul Mays, male    DOB: 1928-06-21, 89 y.o.   MRN: [id number]      HPI Aryo is here for No chief complaint on file.    Wound on leg -     Medications and allergies reviewed with patient and updated if appropriate.  Current Outpatient Medications on File Prior to Visit  Medication Sig Dispense Refill   acetaminophen (TYLENOL) 325 MG tablet Take 2 tablets (650 mg total) by mouth 2 (two) times daily. May take up to 2 Tabs Q 6 hr prn for pain - MDD 3000 mg 240 tablet 11   allopurinol (ZYLOPRIM) 300 MG tablet TAKE 1 TABLET(300 MG) BY MOUTH DAILY AS NEEDED FOR GOUT FLARE 90 tablet 0   amoxicillin (AMOXIL) 500 MG tablet GIVE 4 TABS (2000MG ) BY MOUTH 1H BEFORE APPT 4 tablet 2   ANTI-DIARRHEAL 2 MG tablet GIVE 1 TAB BY MOUTH 4 TIMES DAILY AS NEEDED FOR DIARRHEA OR LOOSE STOOLS 30 tablet 11   apixaban (ELIQUIS) 2.5 MG TABS tablet GIVE 1 TAB BY MOUTH TWICE DAILY 60 tablet 5   Bismuth Subsalicylate (PEPTO BISMOL PO) Take by mouth as needed. Prn as needed for upset stomach     Cholecalciferol (VITAMIN D3) 50 MCG (2000 UT) capsule GIVE 1 CAP BY MOUTH ONCE DAILY 30 capsule 11   furosemide (LASIX) 20 MG tablet Take 1 tablet (20 mg total) by mouth daily as needed for fluid. 90 tablet 3   iron polysaccharides (NIFEREX) 150 MG capsule TAKE 1 CAPSULE (150 MG TOTAL) BY MOUTH DAILY. 30 capsule 3   metoprolol tartrate (LOPRESSOR) 25 MG tablet Take 0.5 tablets (12.5 mg total) by mouth 2 (two) times daily. 30 tablet 11   oxybutynin (DITROPAN) 5 MG tablet Take 2 tablets (10 mg total) by mouth every 8 (eight) hours as needed for bladder spasms. 20 tablet 0   potassium chloride (KLOR-CON M) 10 MEQ tablet Take 1 tablet (10 mEq total) by mouth daily as needed (ONLY WHEN TAKING FUROSEMIDE). Take 1 tablet by mouth daily (with Furosemide) 90 tablet 3   pravastatin (PRAVACHOL) 40 MG tablet GIVE 1 TAB BY MOUTH ONCE DAILY 30 tablet 11   vitamin B-12 (CYANOCOBALAMIN) 500 MCG tablet  GIVE 1 TAB BY MOUTH ONCE DAILY 30 tablet 11   No current facility-administered medications on file prior to visit.    Review of Systems     Objective:  There were no vitals filed for this visit. BP Readings from Last 3 Encounters:  07/23/23 130/82  06/01/23 130/82  05/19/23 (!) 154/62   Wt Readings from Last 3 Encounters:  07/23/23 122 lb (55.3 kg)  06/01/23 127 lb (57.6 kg)  05/19/23 123 lb (55.8 kg)   There is no height or weight on file to calculate BMI.    Physical Exam         Assessment & Plan:    See Problem List for Assessment and Plan of chronic medical problems.

## 2023-08-17 ENCOUNTER — Ambulatory Visit: Payer: Medicare Other | Admitting: Internal Medicine

## 2023-08-17 VITALS — BP 128/72 | HR 70 | Temp 98.0°F | Ht 66.0 in | Wt 127.0 lb

## 2023-08-17 DIAGNOSIS — S81802A Unspecified open wound, left lower leg, initial encounter: Secondary | ICD-10-CM

## 2023-08-17 DIAGNOSIS — R451 Restlessness and agitation: Secondary | ICD-10-CM | POA: Diagnosis not present

## 2023-08-17 DIAGNOSIS — I10 Essential (primary) hypertension: Secondary | ICD-10-CM | POA: Diagnosis not present

## 2023-08-17 DIAGNOSIS — R600 Localized edema: Secondary | ICD-10-CM

## 2023-08-17 NOTE — Assessment & Plan Note (Signed)
Chronic  Continue Seroquel 25 mg nightly

## 2023-08-17 NOTE — Assessment & Plan Note (Signed)
Chronic controlled Bilateral edema lower extremities Continue Lasix 20 mg daily as needed

## 2023-08-17 NOTE — Assessment & Plan Note (Signed)
Subacute He has a few small left lower leg wounds have been present for at least 2 months They are slowly healing No evidence of infection Staff where he was has been changing them?  2-3 times a week He does shower twice a week and unfortunately sometimes they are not able to come right away to change the bandage and he will have to wear a wet bandage for a few days Will order home health nursing to oversee wound care to make sure wound is healing properly Advised staff to change after he showers and to change to-3 times a week

## 2023-08-17 NOTE — Assessment & Plan Note (Signed)
Chronic BP controlled Continue metoprolol 12.5 mg twice daily

## 2023-08-18 ENCOUNTER — Other Ambulatory Visit: Payer: Self-pay | Admitting: Internal Medicine

## 2023-08-19 DIAGNOSIS — Z515 Encounter for palliative care: Secondary | ICD-10-CM | POA: Diagnosis not present

## 2023-08-21 DIAGNOSIS — I5032 Chronic diastolic (congestive) heart failure: Secondary | ICD-10-CM | POA: Diagnosis not present

## 2023-08-21 DIAGNOSIS — J449 Chronic obstructive pulmonary disease, unspecified: Secondary | ICD-10-CM | POA: Diagnosis not present

## 2023-08-21 DIAGNOSIS — I272 Pulmonary hypertension, unspecified: Secondary | ICD-10-CM | POA: Diagnosis not present

## 2023-08-21 DIAGNOSIS — G8929 Other chronic pain: Secondary | ICD-10-CM | POA: Diagnosis not present

## 2023-08-21 DIAGNOSIS — I2510 Atherosclerotic heart disease of native coronary artery without angina pectoris: Secondary | ICD-10-CM | POA: Diagnosis not present

## 2023-08-21 DIAGNOSIS — M25511 Pain in right shoulder: Secondary | ICD-10-CM | POA: Diagnosis not present

## 2023-08-21 DIAGNOSIS — R634 Abnormal weight loss: Secondary | ICD-10-CM | POA: Diagnosis not present

## 2023-08-21 DIAGNOSIS — G43909 Migraine, unspecified, not intractable, without status migrainosus: Secondary | ICD-10-CM | POA: Diagnosis not present

## 2023-08-21 DIAGNOSIS — M65331 Trigger finger, right middle finger: Secondary | ICD-10-CM | POA: Diagnosis not present

## 2023-08-21 DIAGNOSIS — M542 Cervicalgia: Secondary | ICD-10-CM | POA: Diagnosis not present

## 2023-08-21 DIAGNOSIS — I872 Venous insufficiency (chronic) (peripheral): Secondary | ICD-10-CM | POA: Diagnosis not present

## 2023-08-21 DIAGNOSIS — I130 Hypertensive heart and chronic kidney disease with heart failure and stage 1 through stage 4 chronic kidney disease, or unspecified chronic kidney disease: Secondary | ICD-10-CM | POA: Diagnosis not present

## 2023-08-21 DIAGNOSIS — N183 Chronic kidney disease, stage 3 unspecified: Secondary | ICD-10-CM | POA: Diagnosis not present

## 2023-08-21 DIAGNOSIS — G4733 Obstructive sleep apnea (adult) (pediatric): Secondary | ICD-10-CM | POA: Diagnosis not present

## 2023-08-21 DIAGNOSIS — M25512 Pain in left shoulder: Secondary | ICD-10-CM | POA: Diagnosis not present

## 2023-08-21 DIAGNOSIS — M109 Gout, unspecified: Secondary | ICD-10-CM | POA: Diagnosis not present

## 2023-08-21 DIAGNOSIS — D631 Anemia in chronic kidney disease: Secondary | ICD-10-CM | POA: Diagnosis not present

## 2023-08-21 DIAGNOSIS — M159 Polyosteoarthritis, unspecified: Secondary | ICD-10-CM | POA: Diagnosis not present

## 2023-08-21 DIAGNOSIS — S81812D Laceration without foreign body, left lower leg, subsequent encounter: Secondary | ICD-10-CM | POA: Diagnosis not present

## 2023-08-21 DIAGNOSIS — G609 Hereditary and idiopathic neuropathy, unspecified: Secondary | ICD-10-CM | POA: Diagnosis not present

## 2023-08-21 DIAGNOSIS — K5900 Constipation, unspecified: Secondary | ICD-10-CM | POA: Diagnosis not present

## 2023-08-21 DIAGNOSIS — M65351 Trigger finger, right little finger: Secondary | ICD-10-CM | POA: Diagnosis not present

## 2023-08-21 DIAGNOSIS — F419 Anxiety disorder, unspecified: Secondary | ICD-10-CM | POA: Diagnosis not present

## 2023-08-21 DIAGNOSIS — D696 Thrombocytopenia, unspecified: Secondary | ICD-10-CM | POA: Diagnosis not present

## 2023-08-21 DIAGNOSIS — I480 Paroxysmal atrial fibrillation: Secondary | ICD-10-CM | POA: Diagnosis not present

## 2023-08-25 DIAGNOSIS — D696 Thrombocytopenia, unspecified: Secondary | ICD-10-CM | POA: Diagnosis not present

## 2023-08-25 DIAGNOSIS — I480 Paroxysmal atrial fibrillation: Secondary | ICD-10-CM | POA: Diagnosis not present

## 2023-08-25 DIAGNOSIS — N183 Chronic kidney disease, stage 3 unspecified: Secondary | ICD-10-CM | POA: Diagnosis not present

## 2023-08-25 DIAGNOSIS — I130 Hypertensive heart and chronic kidney disease with heart failure and stage 1 through stage 4 chronic kidney disease, or unspecified chronic kidney disease: Secondary | ICD-10-CM | POA: Diagnosis not present

## 2023-08-25 DIAGNOSIS — I5032 Chronic diastolic (congestive) heart failure: Secondary | ICD-10-CM | POA: Diagnosis not present

## 2023-08-25 DIAGNOSIS — S81812D Laceration without foreign body, left lower leg, subsequent encounter: Secondary | ICD-10-CM | POA: Diagnosis not present

## 2023-08-28 DIAGNOSIS — N183 Chronic kidney disease, stage 3 unspecified: Secondary | ICD-10-CM | POA: Diagnosis not present

## 2023-08-28 DIAGNOSIS — S81812D Laceration without foreign body, left lower leg, subsequent encounter: Secondary | ICD-10-CM | POA: Diagnosis not present

## 2023-08-28 DIAGNOSIS — I480 Paroxysmal atrial fibrillation: Secondary | ICD-10-CM | POA: Diagnosis not present

## 2023-08-28 DIAGNOSIS — I5032 Chronic diastolic (congestive) heart failure: Secondary | ICD-10-CM | POA: Diagnosis not present

## 2023-08-28 DIAGNOSIS — I130 Hypertensive heart and chronic kidney disease with heart failure and stage 1 through stage 4 chronic kidney disease, or unspecified chronic kidney disease: Secondary | ICD-10-CM | POA: Diagnosis not present

## 2023-08-28 DIAGNOSIS — D696 Thrombocytopenia, unspecified: Secondary | ICD-10-CM | POA: Diagnosis not present

## 2023-08-31 DIAGNOSIS — G609 Hereditary and idiopathic neuropathy, unspecified: Secondary | ICD-10-CM | POA: Diagnosis not present

## 2023-08-31 DIAGNOSIS — J449 Chronic obstructive pulmonary disease, unspecified: Secondary | ICD-10-CM | POA: Diagnosis not present

## 2023-08-31 DIAGNOSIS — I272 Pulmonary hypertension, unspecified: Secondary | ICD-10-CM | POA: Diagnosis not present

## 2023-08-31 DIAGNOSIS — I5032 Chronic diastolic (congestive) heart failure: Secondary | ICD-10-CM | POA: Diagnosis not present

## 2023-08-31 DIAGNOSIS — I480 Paroxysmal atrial fibrillation: Secondary | ICD-10-CM | POA: Diagnosis not present

## 2023-08-31 DIAGNOSIS — S81812D Laceration without foreign body, left lower leg, subsequent encounter: Secondary | ICD-10-CM | POA: Diagnosis not present

## 2023-08-31 DIAGNOSIS — N183 Chronic kidney disease, stage 3 unspecified: Secondary | ICD-10-CM | POA: Diagnosis not present

## 2023-08-31 DIAGNOSIS — M109 Gout, unspecified: Secondary | ICD-10-CM | POA: Diagnosis not present

## 2023-08-31 DIAGNOSIS — M159 Polyosteoarthritis, unspecified: Secondary | ICD-10-CM | POA: Diagnosis not present

## 2023-08-31 DIAGNOSIS — I2510 Atherosclerotic heart disease of native coronary artery without angina pectoris: Secondary | ICD-10-CM | POA: Diagnosis not present

## 2023-08-31 DIAGNOSIS — I130 Hypertensive heart and chronic kidney disease with heart failure and stage 1 through stage 4 chronic kidney disease, or unspecified chronic kidney disease: Secondary | ICD-10-CM | POA: Diagnosis not present

## 2023-08-31 DIAGNOSIS — D696 Thrombocytopenia, unspecified: Secondary | ICD-10-CM | POA: Diagnosis not present

## 2023-09-04 DIAGNOSIS — I5032 Chronic diastolic (congestive) heart failure: Secondary | ICD-10-CM | POA: Diagnosis not present

## 2023-09-04 DIAGNOSIS — I130 Hypertensive heart and chronic kidney disease with heart failure and stage 1 through stage 4 chronic kidney disease, or unspecified chronic kidney disease: Secondary | ICD-10-CM | POA: Diagnosis not present

## 2023-09-04 DIAGNOSIS — N183 Chronic kidney disease, stage 3 unspecified: Secondary | ICD-10-CM | POA: Diagnosis not present

## 2023-09-04 DIAGNOSIS — S81812D Laceration without foreign body, left lower leg, subsequent encounter: Secondary | ICD-10-CM | POA: Diagnosis not present

## 2023-09-04 DIAGNOSIS — D696 Thrombocytopenia, unspecified: Secondary | ICD-10-CM | POA: Diagnosis not present

## 2023-09-04 DIAGNOSIS — I480 Paroxysmal atrial fibrillation: Secondary | ICD-10-CM | POA: Diagnosis not present

## 2023-09-07 DIAGNOSIS — D696 Thrombocytopenia, unspecified: Secondary | ICD-10-CM | POA: Diagnosis not present

## 2023-09-07 DIAGNOSIS — I480 Paroxysmal atrial fibrillation: Secondary | ICD-10-CM | POA: Diagnosis not present

## 2023-09-07 DIAGNOSIS — N183 Chronic kidney disease, stage 3 unspecified: Secondary | ICD-10-CM | POA: Diagnosis not present

## 2023-09-07 DIAGNOSIS — Z23 Encounter for immunization: Secondary | ICD-10-CM | POA: Diagnosis not present

## 2023-09-07 DIAGNOSIS — I5032 Chronic diastolic (congestive) heart failure: Secondary | ICD-10-CM | POA: Diagnosis not present

## 2023-09-07 DIAGNOSIS — S81812D Laceration without foreign body, left lower leg, subsequent encounter: Secondary | ICD-10-CM | POA: Diagnosis not present

## 2023-09-07 DIAGNOSIS — I130 Hypertensive heart and chronic kidney disease with heart failure and stage 1 through stage 4 chronic kidney disease, or unspecified chronic kidney disease: Secondary | ICD-10-CM | POA: Diagnosis not present

## 2023-09-10 DIAGNOSIS — N183 Chronic kidney disease, stage 3 unspecified: Secondary | ICD-10-CM | POA: Diagnosis not present

## 2023-09-10 DIAGNOSIS — I480 Paroxysmal atrial fibrillation: Secondary | ICD-10-CM | POA: Diagnosis not present

## 2023-09-10 DIAGNOSIS — S81812D Laceration without foreign body, left lower leg, subsequent encounter: Secondary | ICD-10-CM | POA: Diagnosis not present

## 2023-09-10 DIAGNOSIS — I5032 Chronic diastolic (congestive) heart failure: Secondary | ICD-10-CM | POA: Diagnosis not present

## 2023-09-10 DIAGNOSIS — I130 Hypertensive heart and chronic kidney disease with heart failure and stage 1 through stage 4 chronic kidney disease, or unspecified chronic kidney disease: Secondary | ICD-10-CM | POA: Diagnosis not present

## 2023-09-10 DIAGNOSIS — D696 Thrombocytopenia, unspecified: Secondary | ICD-10-CM | POA: Diagnosis not present

## 2023-09-14 DIAGNOSIS — I130 Hypertensive heart and chronic kidney disease with heart failure and stage 1 through stage 4 chronic kidney disease, or unspecified chronic kidney disease: Secondary | ICD-10-CM | POA: Diagnosis not present

## 2023-09-14 DIAGNOSIS — I480 Paroxysmal atrial fibrillation: Secondary | ICD-10-CM | POA: Diagnosis not present

## 2023-09-14 DIAGNOSIS — N183 Chronic kidney disease, stage 3 unspecified: Secondary | ICD-10-CM | POA: Diagnosis not present

## 2023-09-14 DIAGNOSIS — I5032 Chronic diastolic (congestive) heart failure: Secondary | ICD-10-CM | POA: Diagnosis not present

## 2023-09-14 DIAGNOSIS — S81812D Laceration without foreign body, left lower leg, subsequent encounter: Secondary | ICD-10-CM | POA: Diagnosis not present

## 2023-09-14 DIAGNOSIS — D696 Thrombocytopenia, unspecified: Secondary | ICD-10-CM | POA: Diagnosis not present

## 2023-09-20 DIAGNOSIS — S81812D Laceration without foreign body, left lower leg, subsequent encounter: Secondary | ICD-10-CM | POA: Diagnosis not present

## 2023-09-20 DIAGNOSIS — I872 Venous insufficiency (chronic) (peripheral): Secondary | ICD-10-CM | POA: Diagnosis not present

## 2023-09-20 DIAGNOSIS — I5032 Chronic diastolic (congestive) heart failure: Secondary | ICD-10-CM | POA: Diagnosis not present

## 2023-09-20 DIAGNOSIS — G8929 Other chronic pain: Secondary | ICD-10-CM | POA: Diagnosis not present

## 2023-09-20 DIAGNOSIS — M25512 Pain in left shoulder: Secondary | ICD-10-CM | POA: Diagnosis not present

## 2023-09-20 DIAGNOSIS — F419 Anxiety disorder, unspecified: Secondary | ICD-10-CM | POA: Diagnosis not present

## 2023-09-20 DIAGNOSIS — M109 Gout, unspecified: Secondary | ICD-10-CM | POA: Diagnosis not present

## 2023-09-20 DIAGNOSIS — M159 Polyosteoarthritis, unspecified: Secondary | ICD-10-CM | POA: Diagnosis not present

## 2023-09-20 DIAGNOSIS — M65331 Trigger finger, right middle finger: Secondary | ICD-10-CM | POA: Diagnosis not present

## 2023-09-20 DIAGNOSIS — G43909 Migraine, unspecified, not intractable, without status migrainosus: Secondary | ICD-10-CM | POA: Diagnosis not present

## 2023-09-20 DIAGNOSIS — J449 Chronic obstructive pulmonary disease, unspecified: Secondary | ICD-10-CM | POA: Diagnosis not present

## 2023-09-20 DIAGNOSIS — M542 Cervicalgia: Secondary | ICD-10-CM | POA: Diagnosis not present

## 2023-09-20 DIAGNOSIS — N183 Chronic kidney disease, stage 3 unspecified: Secondary | ICD-10-CM | POA: Diagnosis not present

## 2023-09-20 DIAGNOSIS — R634 Abnormal weight loss: Secondary | ICD-10-CM | POA: Diagnosis not present

## 2023-09-20 DIAGNOSIS — K5900 Constipation, unspecified: Secondary | ICD-10-CM | POA: Diagnosis not present

## 2023-09-20 DIAGNOSIS — M65351 Trigger finger, right little finger: Secondary | ICD-10-CM | POA: Diagnosis not present

## 2023-09-20 DIAGNOSIS — I130 Hypertensive heart and chronic kidney disease with heart failure and stage 1 through stage 4 chronic kidney disease, or unspecified chronic kidney disease: Secondary | ICD-10-CM | POA: Diagnosis not present

## 2023-09-20 DIAGNOSIS — I480 Paroxysmal atrial fibrillation: Secondary | ICD-10-CM | POA: Diagnosis not present

## 2023-09-20 DIAGNOSIS — G609 Hereditary and idiopathic neuropathy, unspecified: Secondary | ICD-10-CM | POA: Diagnosis not present

## 2023-09-20 DIAGNOSIS — D631 Anemia in chronic kidney disease: Secondary | ICD-10-CM | POA: Diagnosis not present

## 2023-09-20 DIAGNOSIS — D696 Thrombocytopenia, unspecified: Secondary | ICD-10-CM | POA: Diagnosis not present

## 2023-09-20 DIAGNOSIS — M25511 Pain in right shoulder: Secondary | ICD-10-CM | POA: Diagnosis not present

## 2023-09-20 DIAGNOSIS — I2510 Atherosclerotic heart disease of native coronary artery without angina pectoris: Secondary | ICD-10-CM | POA: Diagnosis not present

## 2023-09-20 DIAGNOSIS — G4733 Obstructive sleep apnea (adult) (pediatric): Secondary | ICD-10-CM | POA: Diagnosis not present

## 2023-09-20 DIAGNOSIS — I272 Pulmonary hypertension, unspecified: Secondary | ICD-10-CM | POA: Diagnosis not present

## 2023-09-22 DIAGNOSIS — D696 Thrombocytopenia, unspecified: Secondary | ICD-10-CM | POA: Diagnosis not present

## 2023-09-22 DIAGNOSIS — N183 Chronic kidney disease, stage 3 unspecified: Secondary | ICD-10-CM | POA: Diagnosis not present

## 2023-09-22 DIAGNOSIS — I480 Paroxysmal atrial fibrillation: Secondary | ICD-10-CM | POA: Diagnosis not present

## 2023-09-22 DIAGNOSIS — I130 Hypertensive heart and chronic kidney disease with heart failure and stage 1 through stage 4 chronic kidney disease, or unspecified chronic kidney disease: Secondary | ICD-10-CM | POA: Diagnosis not present

## 2023-09-22 DIAGNOSIS — S81812D Laceration without foreign body, left lower leg, subsequent encounter: Secondary | ICD-10-CM | POA: Diagnosis not present

## 2023-09-22 DIAGNOSIS — I5032 Chronic diastolic (congestive) heart failure: Secondary | ICD-10-CM | POA: Diagnosis not present

## 2023-10-02 DIAGNOSIS — Z515 Encounter for palliative care: Secondary | ICD-10-CM | POA: Diagnosis not present

## 2023-10-14 DIAGNOSIS — I130 Hypertensive heart and chronic kidney disease with heart failure and stage 1 through stage 4 chronic kidney disease, or unspecified chronic kidney disease: Secondary | ICD-10-CM | POA: Diagnosis not present

## 2023-10-14 DIAGNOSIS — I480 Paroxysmal atrial fibrillation: Secondary | ICD-10-CM | POA: Diagnosis not present

## 2023-10-14 DIAGNOSIS — I5032 Chronic diastolic (congestive) heart failure: Secondary | ICD-10-CM | POA: Diagnosis not present

## 2023-10-14 DIAGNOSIS — N183 Chronic kidney disease, stage 3 unspecified: Secondary | ICD-10-CM | POA: Diagnosis not present

## 2023-10-14 DIAGNOSIS — D696 Thrombocytopenia, unspecified: Secondary | ICD-10-CM | POA: Diagnosis not present

## 2023-10-14 DIAGNOSIS — S81812D Laceration without foreign body, left lower leg, subsequent encounter: Secondary | ICD-10-CM | POA: Diagnosis not present

## 2023-10-15 DIAGNOSIS — M2011 Hallux valgus (acquired), right foot: Secondary | ICD-10-CM | POA: Diagnosis not present

## 2023-10-15 DIAGNOSIS — B351 Tinea unguium: Secondary | ICD-10-CM | POA: Diagnosis not present

## 2023-10-28 DIAGNOSIS — Z515 Encounter for palliative care: Secondary | ICD-10-CM | POA: Diagnosis not present

## 2023-11-27 DIAGNOSIS — Z515 Encounter for palliative care: Secondary | ICD-10-CM | POA: Diagnosis not present

## 2023-12-15 ENCOUNTER — Other Ambulatory Visit: Payer: Self-pay | Admitting: Internal Medicine

## 2023-12-15 ENCOUNTER — Other Ambulatory Visit: Payer: Self-pay | Admitting: Physician Assistant

## 2023-12-29 ENCOUNTER — Other Ambulatory Visit: Payer: Self-pay | Admitting: Internal Medicine

## 2023-12-31 DIAGNOSIS — Z515 Encounter for palliative care: Secondary | ICD-10-CM | POA: Diagnosis not present

## 2024-01-01 DIAGNOSIS — N401 Enlarged prostate with lower urinary tract symptoms: Secondary | ICD-10-CM | POA: Diagnosis not present

## 2024-01-01 DIAGNOSIS — R310 Gross hematuria: Secondary | ICD-10-CM | POA: Diagnosis not present

## 2024-01-01 DIAGNOSIS — R350 Frequency of micturition: Secondary | ICD-10-CM | POA: Diagnosis not present

## 2024-01-07 DIAGNOSIS — Z85828 Personal history of other malignant neoplasm of skin: Secondary | ICD-10-CM | POA: Diagnosis not present

## 2024-01-07 DIAGNOSIS — D0339 Melanoma in situ of other parts of face: Secondary | ICD-10-CM | POA: Diagnosis not present

## 2024-01-07 DIAGNOSIS — D485 Neoplasm of uncertain behavior of skin: Secondary | ICD-10-CM | POA: Diagnosis not present

## 2024-01-19 ENCOUNTER — Other Ambulatory Visit: Payer: Self-pay | Admitting: Internal Medicine

## 2024-01-21 ENCOUNTER — Ambulatory Visit (HOSPITAL_COMMUNITY): Payer: Medicare Other | Attending: Internal Medicine

## 2024-01-21 DIAGNOSIS — I5032 Chronic diastolic (congestive) heart failure: Secondary | ICD-10-CM | POA: Insufficient documentation

## 2024-01-21 DIAGNOSIS — Z952 Presence of prosthetic heart valve: Secondary | ICD-10-CM | POA: Insufficient documentation

## 2024-01-21 LAB — ECHOCARDIOGRAM COMPLETE
AV Mean grad: 9.5 mmHg
AV Peak grad: 18.8 mmHg
Ao pk vel: 2.17 m/s
MV M vel: 5.22 m/s
MV Peak grad: 109 mmHg
Radius: 0.5 cm
S' Lateral: 2.5 cm

## 2024-01-30 ENCOUNTER — Other Ambulatory Visit: Payer: Self-pay | Admitting: Internal Medicine

## 2024-01-30 MED ORDER — VITAMIN D3 50 MCG (2000 UT) PO CAPS: 2000.0000 [IU] | ORAL_CAPSULE | Freq: Every day | ORAL | 11 refills | Status: AC

## 2024-01-30 MED ORDER — POLYSACCHARIDE IRON COMPLEX 150 MG PO CAPS: 150.0000 mg | ORAL_CAPSULE | Freq: Every day | ORAL | 11 refills | Status: DC

## 2024-01-30 MED ORDER — VITAMIN B-12 500 MCG PO TABS: 500.0000 ug | ORAL_TABLET | Freq: Every day | ORAL | 11 refills | Status: DC

## 2024-02-01 DIAGNOSIS — L245 Irritant contact dermatitis due to other chemical products: Secondary | ICD-10-CM | POA: Diagnosis not present

## 2024-02-01 DIAGNOSIS — Z85828 Personal history of other malignant neoplasm of skin: Secondary | ICD-10-CM | POA: Diagnosis not present

## 2024-02-01 DIAGNOSIS — Z85820 Personal history of malignant melanoma of skin: Secondary | ICD-10-CM | POA: Diagnosis not present

## 2024-02-02 DIAGNOSIS — M79675 Pain in left toe(s): Secondary | ICD-10-CM | POA: Diagnosis not present

## 2024-02-02 DIAGNOSIS — B351 Tinea unguium: Secondary | ICD-10-CM | POA: Diagnosis not present

## 2024-02-02 DIAGNOSIS — I739 Peripheral vascular disease, unspecified: Secondary | ICD-10-CM | POA: Diagnosis not present

## 2024-02-09 DIAGNOSIS — Z515 Encounter for palliative care: Secondary | ICD-10-CM | POA: Diagnosis not present

## 2024-02-12 ENCOUNTER — Telehealth: Payer: Self-pay | Admitting: Cardiovascular Disease

## 2024-02-12 NOTE — Telephone Encounter (Signed)
Patient's son returned call

## 2024-02-12 NOTE — Telephone Encounter (Signed)
 Spoke with Pt's son. He is just wanting a appointment. There was no openings. Told him I would send a request over to Dr Viann Graces nurse to see if they could fit him in somewhere.

## 2024-02-16 ENCOUNTER — Ambulatory Visit: Payer: Self-pay

## 2024-02-17 NOTE — Telephone Encounter (Signed)
 Attempted to reach son, still no answer on file. Left detailed message that I scheduled with Cooper on 03/17/24 at 10:20 and to call back if needed to change this.

## 2024-02-23 ENCOUNTER — Other Ambulatory Visit: Payer: Self-pay | Admitting: Internal Medicine

## 2024-02-23 MED ORDER — VITAMIN B-12 500 MCG PO TABS: 500.0000 ug | ORAL_TABLET | Freq: Every day | ORAL | 11 refills | Status: DC

## 2024-02-23 NOTE — Telephone Encounter (Signed)
 Copied from CRM [id number]. Topic: Clinical - Medication Refill >> Feb 23, 2024 12:40 PM Abigail D wrote: Medication:  vitamin B-12 (CYANOCOBALAMIN ) 500 MCG tablet Has the patient contacted their pharmacy? No  (Agent: If no, request that the patient contact the pharmacy for the refill. If patient does not wish to contact the pharmacy document the reason why and proceed with request.) (Agent: If yes, when and what did the pharmacy advise?)  This is the patient's preferred pharmacy:  [facility] [place] - [address] [place] [address] Phone: [phone] Fax: [phone]  Is this the correct pharmacy for this prescription? Yes If no, delete pharmacy and type the correct one.   Has the prescription been filled recently? Yes  Is the patient out of the medication? Yes  Has the patient been seen for an appointment in the last year OR does the patient have an upcoming appointment? Yes  Can we respond through MyChart? Yes  Agent: Please be advised that Rx refills may take up to 3 business days. We ask that you follow-up with your pharmacy.

## 2024-03-16 ENCOUNTER — Other Ambulatory Visit: Payer: Self-pay | Admitting: Internal Medicine

## 2024-03-16 ENCOUNTER — Ambulatory Visit (INDEPENDENT_AMBULATORY_CARE_PROVIDER_SITE_OTHER)

## 2024-03-16 VITALS — BP 132/68 | HR 57 | Ht 64.0 in | Wt 121.6 lb

## 2024-03-16 DIAGNOSIS — Z0100 Encounter for examination of eyes and vision without abnormal findings: Secondary | ICD-10-CM

## 2024-03-16 DIAGNOSIS — Z0000 Encounter for general adult medical examination without abnormal findings: Secondary | ICD-10-CM

## 2024-03-16 MED ORDER — SHINGRIX 50 MCG/0.5ML IM SUSR: 0.5000 mL | Freq: Once | INTRAMUSCULAR | 0 refills | Status: AC

## 2024-03-16 NOTE — Progress Notes (Signed)
 Subjective:   Paul Mays is a 89 y.o. who presents for a Medicare Wellness preventive visit.  As a reminder, Annual Wellness Visits don't include a physical exam, and some assessments may be limited, especially if this visit is performed virtually. We may recommend an in-person follow-up visit with your provider if needed.  Visit Complete: In person  Persons Participating in Visit: Patient assisted by Son, Arla Bell.  AWV Questionnaire: No: Patient Medicare AWV questionnaire was not completed prior to this visit.  Cardiac Risk Factors include: advanced age (>22men, >29 women);hypertension;dyslipidemia;male gender     Objective:     Today's Vitals   03/16/24 1347  BP: 132/68  Pulse: (!) 57  Weight: 121 lb 9.6 oz (55.2 kg)  Height: 5' 4 (1.626 m)   Body mass index is 20.87 kg/m.     03/16/2024    1:39 PM 01/16/2023    3:49 PM 12/27/2022    1:16 PM 08/11/2022    1:05 PM 01/29/2022   11:30 AM 12/06/2021   11:04 AM 11/13/2021   12:00 PM  Advanced Directives  Does Patient Have a Medical Advance Directive? Yes Yes No Yes Yes Yes   Type of Estate agent of [place];Living will Healthcare Power of [place];Living will   Healthcare Power of [place];Out of facility DNR (pink MOST or yellow form);Living will Healthcare Power of [place];Living will Healthcare Power of Attorney  Does patient want to make changes to medical advance directive? No - Patient declined    No - Patient declined No - Patient declined No - Patient declined  Copy of Healthcare Power of Attorney in Chart? Yes - validated most recent copy scanned in chart (See row information)    Yes - validated most recent copy scanned in chart (See row information) No - copy requested   Would patient like information on creating a medical advance directive?   No - Patient declined      Pre-existing out of facility DNR order (yellow form or pink MOST form)     Yellow form placed in chart (order not  valid for inpatient use)      Current Medications (verified) Outpatient Encounter Medications as of 03/16/2024  Medication Sig   acetaminophen  (TYLENOL ) 325 MG tablet GIVE 2 TABS(650MG ) BY MOUTH 2 TIMES DAILY  MAY GIVE 2 TABS(650MG ) EVERY 6 HOURS AS NEEDED FOR PAIN (Q6H BETWEEN SCHEDULED DOSES) MAX 3000MG  DAILY   allopurinol  (ZYLOPRIM ) 300 MG tablet TAKE 1 TABLET(300 MG) BY MOUTH DAILY AS NEEDED FOR GOUT FLARE   amoxicillin  (AMOXIL ) 500 MG tablet GIVE 4 TABS (2000MG ) BY MOUTH 1H BEFORE APPT   ANTI-DIARRHEAL 2 MG tablet GIVE 1 TAB BY MOUTH 4 TIMES DAILY AS NEEDED FOR DIARRHEA OR LOOSE STOOLS   Bismuth Subsalicylate (PEPTO BISMOL PO) Take by mouth as needed. Prn as needed for upset stomach   Cholecalciferol  (VITAMIN D3) 50 MCG (2000 UT) capsule Take 1 capsule (2,000 Units total) by mouth daily.   ELIQUIS  2.5 MG TABS tablet GIVE 1 TAB BY MOUTH TWICE DAILY   furosemide  (LASIX ) 20 MG tablet Take 1 tablet (20 mg total) by mouth daily as needed for fluid.   iron  polysaccharides (NIFEREX) 150 MG capsule Take 1 capsule (150 mg total) by mouth daily.   metoprolol  tartrate (LOPRESSOR ) 25 MG tablet TAKE 0.5 TABLETS (12.5 MG TOTAL) BY MOUTH 2 (TWO) TIMES DAILY.   oxybutynin  (DITROPAN ) 5 MG tablet Take 2 tablets (10 mg total) by mouth every 8 (eight) hours as needed  for bladder spasms.   potassium chloride  (KLOR-CON  M) 10 MEQ tablet Take 1 tablet (10 mEq total) by mouth daily as needed (ONLY WHEN TAKING FUROSEMIDE ). Take 1 tablet by mouth daily (with Furosemide )   pravastatin  (PRAVACHOL ) 40 MG tablet GIVE 1 TAB BY MOUTH ONCE DAILY   QUEtiapine  (SEROQUEL ) 25 MG tablet GIVE 1 TAB BY MOUTH AT BEDTIME   vitamin B-12 (CYANOCOBALAMIN ) 500 MCG tablet Take 1 tablet (500 mcg total) by mouth daily.   No facility-administered encounter medications on file as of 03/16/2024.    Allergies (verified) Lisinopril and Spironolactone   History: Past Medical History:  Diagnosis Date   Anxiety    Aortic stenosis     Arthralgia    Arthritis    CAD (coronary artery disease)    Carotid stenosis    Cervicalgia    CLUSTER HEADACHE 02/10/2007   Qualifier: Diagnosis of  By: Donnice Gale MD, Sammie Crigler     Colitis    Edema    Gout    Hearing loss    Herpes zoster without mention of complication    Hyperlipidemia    Hypertension    Hyperuricemia    Impacted cerumen    Migraine    Migraine    Murmur    Olecranon bursitis    OSA (obstructive sleep apnea)    Other and unspecified hyperlipidemia    Other malaise and fatigue    Peripheral neuropathy    Polyneuropathy    Prostate cancer (HCC)    Pulmonary hypertension (HCC)    S/P TAVR (transcatheter aortic valve replacement) 11/12/2021   23mm S3UR via TF approach with Dr. Arlester Ladd and Dr. Sherene Dilling   Unspecified hereditary and idiopathic peripheral neuropathy    Venous insufficiency    Past Surgical History:  Procedure Laterality Date   ANGIOPLASTY     w/ 2 stents in 1999   APPENDECTOMY     CATARACT EXTRACTION W/ INTRAOCULAR LENS IMPLANT Bilateral    HEMORRHOID SURGERY     INTRAOPERATIVE TRANSTHORACIC ECHOCARDIOGRAM N/A 11/12/2021   Procedure: INTRAOPERATIVE TRANSTHORACIC ECHOCARDIOGRAM;  Surgeon: Arnoldo Lapping, MD;  [facility];  Service: Open Heart Surgery;  Laterality: N/A;   LIPOMA EXCISION     RIGHT/LEFT HEART CATH AND CORONARY ANGIOGRAPHY N/A 10/02/2021   Procedure: RIGHT/LEFT HEART CATH AND CORONARY ANGIOGRAPHY;  Surgeon: Arnoldo Lapping, MD;  Location: [facility] INVASIVE CV LAB;  Service: Cardiovascular;  Laterality: N/A;   TONSILLECTOMY     TRANSCATHETER AORTIC VALVE REPLACEMENT, TRANSFEMORAL N/A 11/12/2021   Procedure: TRANSCATHETER AORTIC VALVE REPLACEMENT, TRANSFEMORAL;  Surgeon: Arnoldo Lapping, MD;  Location: [facility] OR;  Service: Open Heart Surgery;  Laterality: N/A;   ULTRASOUND GUIDANCE FOR VASCULAR ACCESS Bilateral 11/12/2021   Procedure: ULTRASOUND GUIDANCE FOR VASCULAR ACCESS;  Surgeon: Arnoldo Lapping, MD;  Location: [facility] OR;  Service: Open Heart  Surgery;  Laterality: Bilateral;   Family History  Problem Relation Age of Onset   Stroke Father        Died, 80   High blood pressure Father    Diabetes Father    Other Mother        Died, 74   Diabetes Brother        Died, 75   Diabetes Sister        Living, 48   Healthy Daughter    Healthy Son    Social History   Socioeconomic History   Marital status: Married    Spouse name: Ottie Blonder   Number of children: 2   Years of education: college  Highest education level: Not on file  Occupational History   Occupation: retired    Comment: worked as a Designer, multimedia: RETIRED  Tobacco Use   Smoking status: Never   Smokeless tobacco: Never  Vaping Use   Vaping status: Never Used  Substance and Sexual Activity   Alcohol use: Yes    Alcohol/week: 1.0 standard drink of alcohol    Types: 1 Glasses of wine per week    Comment: for holidays   Drug use: No   Sexual activity: Not Currently  Other Topics Concern   Not on file  Social History Narrative   Pt is married and lives with wife (suzanne). Patient finished  two years of college.    Pt has children.   Caffeine -two cups daily.   Left handed.   Retired Investment banker, corporate                    Social Drivers of Corporate investment banker Strain: Low Risk  (03/16/2024)   Overall Financial Resource Strain (CARDIA)    Difficulty of Paying Living Expenses: Not hard at all  Food Insecurity: No Food Insecurity (03/16/2024)   Hunger Vital Sign    Worried About Running Out of Food in the Last Year: Never true    Ran Out of Food in the Last Year: Never true  Transportation Needs: No Transportation Needs (03/16/2024)   PRAPARE - Administrator, Civil Service (Medical): No    Lack of Transportation (Non-Medical): No  Physical Activity: Sufficiently Active (03/16/2024)   Exercise Vital Sign    Days of Exercise per Week: 7 days    Minutes of Exercise per Session: 60 min  Stress: No Stress Concern  Present (03/16/2024)   [employer] of Occupational Health - Occupational Stress Questionnaire    Feeling of Stress : Not at all  Social Connections: Socially Integrated (03/16/2024)   Social Connection and Isolation Panel [NHANES]    Frequency of Communication with Friends and Family: More than three times a week    Frequency of Social Gatherings with Friends and Family: More than three times a week    Attends Religious Services: More than 4 times per year    Active Member of [employer] or Organizations: Yes    Attends Engineer, structural: More than 4 times per year    Marital Status: Married    Tobacco Counseling Counseling given: No    Clinical Intake:  Pre-visit preparation completed: Yes  Pain : No/denies pain     BMI - recorded: 20.87 Nutritional Status: BMI of 19-24  Normal Nutritional Risks: None Diabetes: No  Lab Results  Component Value Date   HGBA1C 4.9 11/08/2021   HGBA1C 5.1 09/24/2015   HGBA1C 5.3 01/27/2011     How often do you need to have someone help you when you read instructions, pamphlets, or other written materials from your doctor or pharmacy?: 3 - Sometimes (Caregiver/Children)  Interpreter Needed?: No  Information entered by :: Kandy Orris, CMA   Activities of Daily Living     03/16/2024    1:52 PM  In your present state of health, do you have any difficulty performing the following activities:  Hearing? 0  Vision? 0  Difficulty concentrating or making decisions? 0  Walking or climbing stairs? 0  Dressing or bathing? 0  Doing errands, shopping? 0  Preparing Food and eating ? N  Using the Toilet? N  In the past  six months, have you accidently leaked urine? Y  Comment wears depends  Do you have problems with loss of bowel control? N  Managing your Medications? N  Managing your Finances? N  Housekeeping or managing your Housekeeping? N    Patient Care Team: Colene Dauphin, MD as PCP - General (Internal  Medicine) Arnoldo Lapping, MD as PCP - Cardiology (Cardiology) Jonathan Neighbor, [facility] (Inactive) as Pharmacist (Pharmacist) Alvina Axon, MD as Consulting Physician (Ophthalmology)  I have updated your Care Teams any recent Medical Services you may have received from other providers in the past year.     Assessment:    This is a routine wellness examination for Paul Mays.  Hearing/Vision screen Hearing Screening - Comments:: Wears hearing aids Vision Screening - Comments:: Wears rx glasses - up to date with routine eye exams with Dr Terrall Ferraris   Goals Addressed               This Visit's Progress     Patient Stated (pt-stated)        Patient stated he plans to stay independent and active as much as possible       Depression Screen     03/16/2024    1:59 PM 08/17/2023    2:10 PM 02/13/2023    2:10 PM 01/09/2023   10:52 AM 11/28/2022    4:22 PM 11/10/2022    1:14 PM 06/10/2022    4:38 PM  PHQ 2/9 Scores  PHQ - 2 Score 3 0 0 0 0 0 0  PHQ- 9 Score 3  2 0 0      Fall Risk     03/16/2024    1:55 PM 08/17/2023    2:09 PM 02/13/2023    2:09 PM 11/28/2022    4:22 PM 11/10/2022    1:14 PM  Fall Risk   Falls in the past year? 1 0 0 0 0  Number falls in past yr: 0 0 0 0 0  Injury with Fall? 1 0 0 0 0  Comment bruise left arm      Risk for fall due to : History of fall(s) No Fall Risks No Fall Risks No Fall Risks No Fall Risks  Follow up Falls evaluation completed;Falls prevention discussed Falls evaluation completed Falls evaluation completed Falls evaluation completed Falls evaluation completed    MEDICARE RISK AT HOME:  Medicare Risk at Home Any stairs in or around the home?: No If so, are there any without handrails?: No Home free of loose throw rugs in walkways, pet beds, electrical cords, etc?: Yes Adequate lighting in your home to reduce risk of falls?: Yes Life alert?: No Use of a cane, walker or w/c?: No Grab bars in the bathroom?: Yes Shower chair or bench in  shower?: Yes Elevated toilet seat or a handicapped toilet?: Yes  TIMED UP AND GO:  Was the test performed?  No  Cognitive Function: 6CIT completed    06/11/2018    1:11 PM  MMSE - Mini Mental State Exam  Orientation to time 5  Orientation to Place 5  Registration 3  Attention/ Calculation 5  Recall 3  Language- name 2 objects 2  Language- repeat 1  Language- follow 3 step command 3  Language- read & follow direction 1  Write a sentence 1  Copy design 1  Total score 30        03/16/2024    1:55 PM 01/29/2022   11:33 AM 08/17/2019    1:55 PM  6CIT Screen  What Year? 0 points 0 points 0 points  What month? 0 points 0 points 0 points  What time? 0 points 0 points 0 points  Count back from 20 0 points 0 points 0 points  Months in reverse 0 points 0 points 0 points  Repeat phrase 0 points 0 points 2 points  Total Score 0 points 0 points 2 points    Immunizations Immunization History  Administered Date(s) Administered   Fluad Quad(high Dose 65+) 06/25/2020   H1N1 09/20/2008   Influenza, High Dose Seasonal PF 07/06/2013, 06/30/2014, 07/06/2014, 07/06/2016, 07/04/2017, 07/06/2017, 06/11/2018, 06/22/2019   Influenza,inj,Quad PF,6+ Mos 07/04/2017   Influenza-Unspecified 08/06/2001, 07/06/2002, 08/07/2003, 07/06/2004, 07/06/2005, 08/06/2006, 07/07/2007, 07/06/2008, 07/06/2010, 07/07/2011, 06/06/2012, 06/14/2012, 07/06/2013, 06/07/2015, 07/08/2016, 06/25/2020   Moderna Sars-Covid-2 Vaccination 12/06/2019, 01/09/2020   PFIZER(Purple Top)SARS-COV-2 Vaccination 10/22/2019, 11/11/2019   Pfizer Covid-19 Vaccine Bivalent Booster 28yrs & up 07/13/2021   Pneumococcal Conjugate-13 07/06/2013, 06/07/2015   Pneumococcal Polysaccharide-23 12/05/2013, 07/08/2016, 02/03/2022   Pneumococcal-Unspecified 06/06/2000   Td 02/10/2015   Td (Adult),unspecified 02/10/2015   Tdap 10/07/2005, 10/06/2013    Screening Tests Health Maintenance  Topic Date Due   Zoster Vaccines- Shingrix (1 of 2)  Never done   COVID-19 Vaccine (6 - 2024-25 season) 06/07/2023   INFLUENZA VACCINE  05/06/2024   DTaP/Tdap/Td (5 - Td or Tdap) 02/09/2025   Medicare Annual Wellness (AWV)  03/16/2025   Pneumonia Vaccine 78+ Years old  Completed   HPV VACCINES  Aged Out   Meningococcal B Vaccine  Aged Out    Health Maintenance  Health Maintenance Due  Topic Date Due   Zoster Vaccines- Shingrix (1 of 2) Never done   COVID-19 Vaccine (6 - 2024-25 season) 06/07/2023   Health Maintenance Items Addressed: 03/16/2024 Referral to Dr Terrall Ferraris for an eye exam  Additional Screening:  Vision Screening: Recommended annual ophthalmology exams for early detection of glaucoma and other disorders of the eye.    Dental Screening: Recommended annual dental exams for proper oral hygiene  Community Resource Referral / Chronic Care Management: CRR required this visit?  No   CCM required this visit?  No   Plan:    I have personally reviewed and noted the following in the patient's chart:   Medical and social history Use of alcohol, tobacco or illicit drugs  Current medications and supplements including opioid prescriptions. Patient is not currently taking opioid prescriptions. Functional ability and status Nutritional status Physical activity Advanced directives List of other physicians Hospitalizations, surgeries, and ER visits in previous 12 months Vitals Screenings to include cognitive, depression, and falls Referrals and appointments  In addition, I have reviewed and discussed with patient certain preventive protocols, quality metrics, and best practice recommendations. A written personalized care plan for preventive services as well as general preventive health recommendations were provided to patient.   Patria Bookbinder, CMA   03/16/2024   After Visit Summary: (In Person-Printed) AVS printed and given to the patient  Notes: Nothing significant to report at this time.

## 2024-03-16 NOTE — Patient Instructions (Addendum)
 [name] , Thank you for taking time out of your busy schedule to complete your Annual Wellness Visit with me. I enjoyed our conversation and look forward to speaking with you again next year. I, as well as your care team,  appreciate your ongoing commitment to your health goals. Please review the following plan we discussed and let me know if I can assist you in the future. Your Game plan/ To Do List    Referrals: If you haven't heard from the office you've been referred to, please reach out to them at the phone provided.  Referral to Dr Terrall Ferraris for a routine eye exam Follow up Visits: Next Medicare AWV with our clinical staff: 03/23/2025   Have you seen your provider in the last 6 months (3 months if uncontrolled diabetes)? No Next Office Visit with your provider: 03/23/2024  Clinician Recommendations:  Aim for 30 minutes of exercise or brisk walking, 6-8 glasses of water, and 5 servings of fruits and vegetables each day.       This is a list of the screening recommended for you and due dates:  Health Maintenance  Topic Date Due   Zoster (Shingles) Vaccine (1 of 2) Never done   COVID-19 Vaccine (6 - 2024-25 season) 06/07/2023   Flu Shot  05/06/2024   DTaP/Tdap/Td vaccine (5 - Td or Tdap) 02/09/2025   Medicare Annual Wellness Visit  03/16/2025   Pneumonia Vaccine  Completed   HPV Vaccine  Aged Out   Meningitis B Vaccine  Aged Out    Advanced directives: (In Chart) A copy of your advanced directives are scanned into your chart should your provider ever need it. Advance Care Planning is important because it:  [x]  Makes sure you receive the medical care that is consistent with your values, goals, and preferences  [x]  It provides guidance to your family and loved ones and reduces their decisional burden about whether or not they are making the right decisions based on your wishes.  Follow the link provided in your after visit summary or read over the paperwork we have mailed to you to help  you started getting your Advance Directives in place. If you need assistance in completing these, please reach out to us  so that we can help you!

## 2024-03-17 ENCOUNTER — Encounter: Payer: Self-pay | Admitting: Cardiovascular Disease

## 2024-03-17 ENCOUNTER — Other Ambulatory Visit: Payer: Self-pay

## 2024-03-17 ENCOUNTER — Ambulatory Visit

## 2024-03-17 ENCOUNTER — Ambulatory Visit: Attending: Cardiovascular Disease | Admitting: Cardiovascular Disease

## 2024-03-17 ENCOUNTER — Other Ambulatory Visit: Payer: Self-pay | Admitting: Internal Medicine

## 2024-03-17 VITALS — BP 146/56 | HR 38 | Ht 66.0 in | Wt 122.6 lb

## 2024-03-17 DIAGNOSIS — I340 Nonrheumatic mitral (valve) insufficiency: Secondary | ICD-10-CM | POA: Diagnosis not present

## 2024-03-17 DIAGNOSIS — Z952 Presence of prosthetic heart valve: Secondary | ICD-10-CM | POA: Diagnosis not present

## 2024-03-17 DIAGNOSIS — I4819 Other persistent atrial fibrillation: Secondary | ICD-10-CM

## 2024-03-17 DIAGNOSIS — I10 Essential (primary) hypertension: Secondary | ICD-10-CM | POA: Diagnosis not present

## 2024-03-17 DIAGNOSIS — I5032 Chronic diastolic (congestive) heart failure: Secondary | ICD-10-CM | POA: Insufficient documentation

## 2024-03-17 MED ORDER — VITAMIN B-12 500 MCG PO TABS: 500.0000 ug | ORAL_TABLET | Freq: Every day | ORAL | 11 refills | Status: AC

## 2024-03-17 NOTE — Progress Notes (Unsigned)
Applied a 3 day Zio XT monitor to patient in the office 

## 2024-03-17 NOTE — Assessment & Plan Note (Signed)
 Appears euvolemic on exam.  Continue furosemide  20 mg as needed

## 2024-03-17 NOTE — Assessment & Plan Note (Signed)
 Blood pressure elevated today.  Will not make any changes at this time.  Hold metoprolol .

## 2024-03-17 NOTE — Assessment & Plan Note (Signed)
 Normal function of his TAVR prosthesis noted.  Continue low-dose apixaban .

## 2024-03-17 NOTE — Patient Instructions (Signed)
 Medication Instructions:  STOP Metoprolol    *If you need a refill on your cardiac medications before your next appointment, please call your pharmacy*  Testing/Procedures: 3 day zio monitor   Your physician has requested that you wear a Zio heart monitor for __3___ days. This will be mailed to your home with instructions on how to apply the monitor and how to return it when finished. Please allow 2 weeks after returning the heart monitor before our office calls you with the results.   Follow-Up: At [facility], you and your health needs are our priority.  As part of our continuing mission to provide you with exceptional heart care, our providers are all part of one team.  This team includes your primary Cardiologist (physician) and Advanced Practice Providers or APPs (Physician Assistants and Nurse Practitioners) who all work together to provide you with the care you need, when you need it.  Your next appointment:   6 month(s)  Provider:   One of our Advanced Practice Providers (APPs): Melita Springer, PA-C  Friddie Jetty, NP Evaline Hill, NP  Theotis Flake, PA-C Lawana Pray, NP  Willis Harter, PA-C Lovette Rud, PA-C  [employer], PA-C Ernest Dick, NP  Marlana Silvan, NP Marcie Sever, PA-C  Laquita Plant, PA-C    Dayna Dunn, PA-C  Scott Weaver, PA-C Palmer Bobo, NP Katlyn West, NP Callie Goodrich, PA-C  Evan Williams, PA-C Sheng Haley, PA-C  Xika Zhao, NP Kathleen Johnson, PA-C   Then, Arnoldo Lapping, MD will plan to see you again in 1 year(s).    We recommend signing up for the patient portal called MyChart.  Sign up information is provided on this After Visit Summary.  MyChart is used to connect with patients for Virtual Visits (Telemedicine).  Patients are able to view lab/test results, encounter notes, upcoming appointments, etc.  Non-urgent messages can be sent to your provider as well.   To learn more about what you can do with MyChart, go to  [email or web address].

## 2024-03-17 NOTE — Telephone Encounter (Signed)
 Copied from CRM [id number]. Topic: Clinical - Medication Refill >> Mar 17, 2024 11:41 AM Melissa C wrote: Medication: vitamin B-12 (CYANOCOBALAMIN ) 500 MCG tablet  Has the patient contacted their pharmacy? No (Agent: If no, request that the patient contact the pharmacy for the refill. If patient does not wish to contact the pharmacy document the reason why and proceed with request.) (Agent: If yes, when and what did the pharmacy advise?)  This is the patient's preferred pharmacy:  [facility] [place] - [address] [place] [address] Phone: [phone] Fax: [phone]   Is this the correct pharmacy for this prescription? Yes If no, delete pharmacy and type the correct one.   Has the prescription been filled recently? No  Is the patient out of the medication? No  Has the patient been seen for an appointment in the last year OR does the patient have an upcoming appointment? Yes  Can we respond through MyChart? No  Agent: Please be advised that Rx refills may take up to 3 business days. We ask that you follow-up with your pharmacy.   Note: Latoya from facility where patient is staying called and stated that she faxed in request for this prescription however I do not see where it has been received or pending.

## 2024-03-17 NOTE — Progress Notes (Signed)
 Cardiology Office Note:    Date:  03/17/2024   ID:  Paul Mays, DOB [birth year], MRN [id number]  PCP:  Colene Dauphin, MD   [facility] Providers Cardiologist:  Arnoldo Lapping, MD     Referring MD: Colene Dauphin, MD   Chief Complaint  Patient presents with   Atrial Fibrillation    History of Present Illness:    Paul Mays is a 89 y.o. male presenting for follow-up of diastolic heart failure and valvular heart disease.  He is here with his son, Paul Mays today.  The patient has a history of TAVR with normal function of his TAVR prosthesis.  He has developed severe mitral regurgitation managed medically in the context of his advanced age.  He had a good bit of weight loss a few years ago but his weight has remained stable now over the past 12 months with 2 pounds of weight gain comparing his weight to last year's visit.  He states that he feels foggy in the mornings, but otherwise has no complaints.  He denies lightheadedness, near-syncope, chest pain, or shortness of breath.  He denies orthopnea, PND, or leg swelling.  His most recent echocardiogram demonstrated normal LVEF, normal RV function, severe mitral regurgitation, severe pulmonary hypertension, and normal function of his TAVR bioprosthesis.  The patient is noted to have slow atrial fibrillation today on his EKG.   Current Medications: Current Meds  Medication Sig   acetaminophen  (TYLENOL ) 325 MG tablet GIVE 2 TABS(650MG ) BY MOUTH 2 TIMES DAILY  MAY GIVE 2 TABS(650MG ) EVERY 6 HOURS AS NEEDED FOR PAIN (Q6H BETWEEN SCHEDULED DOSES) MAX 3000MG  DAILY   allopurinol  (ZYLOPRIM ) 300 MG tablet TAKE 1 TABLET(300 MG) BY MOUTH DAILY AS NEEDED FOR GOUT FLARE   amoxicillin  (AMOXIL ) 500 MG tablet GIVE 4 TABS (2000MG ) BY MOUTH 1H BEFORE APPT   ANTI-DIARRHEAL 2 MG tablet GIVE 1 TAB BY MOUTH 4 TIMES DAILY AS NEEDED FOR DIARRHEA OR LOOSE STOOLS   Bismuth Subsalicylate (PEPTO BISMOL PO) Take by mouth as needed. Prn as needed for upset  stomach   Cholecalciferol  (VITAMIN D3) 50 MCG (2000 UT) capsule Take 1 capsule (2,000 Units total) by mouth daily.   ELIQUIS  2.5 MG TABS tablet GIVE 1 TAB BY MOUTH TWICE DAILY   furosemide  (LASIX ) 20 MG tablet Take 1 tablet (20 mg total) by mouth daily as needed for fluid.   iron  polysaccharides (NIFEREX) 150 MG capsule Take 1 capsule (150 mg total) by mouth daily.   oxybutynin  (DITROPAN ) 5 MG tablet Take 2 tablets (10 mg total) by mouth every 8 (eight) hours as needed for bladder spasms.   potassium chloride  (KLOR-CON  M) 10 MEQ tablet Take 1 tablet (10 mEq total) by mouth daily as needed (ONLY WHEN TAKING FUROSEMIDE ). Take 1 tablet by mouth daily (with Furosemide )   pravastatin  (PRAVACHOL ) 40 MG tablet GIVE 1 TAB BY MOUTH ONCE DAILY   QUEtiapine  (SEROQUEL ) 25 MG tablet GIVE 1 TAB BY MOUTH AT BEDTIME   vitamin B-12 (CYANOCOBALAMIN ) 500 MCG tablet Take 1 tablet (500 mcg total) by mouth daily.   [DISCONTINUED] metoprolol  tartrate (LOPRESSOR ) 25 MG tablet TAKE 0.5 TABLETS (12.5 MG TOTAL) BY MOUTH 2 (TWO) TIMES DAILY.     Allergies:   Lisinopril and Spironolactone   ROS:   Please see the history of present illness.    All other systems reviewed and are negative.  EKGs/Labs/Other Studies Reviewed:    The following studies were reviewed today: Cardiac Studies & Procedures  ______________________________________________________________________________________________ CARDIAC CATHETERIZATION  CARDIAC CATHETERIZATION 10/02/2021  Conclusion 1.  Severe single-vessel coronary artery disease with chronic occlusion of the RCA, collateralized from the left coronary artery. 2.  Calcified, restricted aortic valve on plain fluoroscopy, difficult to cross with a straight wire, but low mean transaortic gradient of only 8 mmHg. 3.  Severe pulmonary hypertension with pulmonary artery pressure of 78/29, mean 48 mmHg. 4.  Transpulmonary gradient 23 mmHg, PVR 4.6 Wood units.  Recommendations: Further valve  team review to consider TAVR in this patient with what appears to be severe aortic stenosis by noninvasive assessment, medical therapy for coronary artery disease, consider restrictive cardiomyopathy.  Findings Coronary Findings Diagnostic  Dominance: Right  Left Main There is mild diffuse disease throughout the vessel.  Left Anterior Descending Prox LAD to Mid LAD lesion is 50% stenosed. The lesion is calcified.  Left Circumflex There is mild diffuse disease throughout the vessel.  Right Coronary Artery Prox RCA lesion is 100% stenosed.  Right Posterior Descending Artery Collaterals RPDA filled by collaterals from 1st Sept.  First Right Posterolateral Branch Collaterals 1st RPL filled by collaterals from 2nd Sept.  Intervention  No interventions have been documented.     ECHOCARDIOGRAM  ECHOCARDIOGRAM COMPLETE 01/21/2024  Narrative ECHOCARDIOGRAM REPORT    Patient Name:   Paul Mays Date of Exam: 01/21/2024 Medical Rec #:  [id number]      Height:       66.0 in Accession #:    [id number]     Weight:       127.0 lb Date of Birth:  [birth year]       BSA:          1.649 m Patient Age:    89 years       BP:           128/72 mmHg Patient Gender: M              HR:           43 bpm. Exam Location:  [address]  Procedure: 2D Echo (Both Spectral and Color Flow Doppler were utilized during procedure).  Indications:    Z95.2 S/P Aortic valve replacement (23mm Sapien);  History:        Patient has prior history of Echocardiogram examinations, most recent 10/28/2022. CHF, CAD, Arrythmias:Atrial Fibrillation; Risk Factors:Hypertension and Dyslipidemia. Aortic stenosis. Bilateral leg edema. Carotid stenosis. LVH.  Sonographer:    Mylinda Asa RCS Referring Phys: [phone] Vianca Bracher  IMPRESSIONS   1. Left ventricular ejection fraction, by estimation, is 60 to 65%. The left ventricle has normal function. The left ventricle has no regional wall motion abnormalities.  There is severe concentric left ventricular hypertrophy. Left ventricular diastolic parameters are indeterminate. 2. Right ventricular systolic function is normal. The right ventricular size is normal. There is severely elevated pulmonary artery systolic pressure. The estimated right ventricular systolic pressure is 75.6 mmHg. 3. Left atrial size was severely dilated. 4. Right atrial size was mild to moderately dilated. 5. The mitral valve is degenerative. Severe mitral valve regurgitation. No evidence of mitral stenosis. Moderate mitral annular calcification. 6. Tricuspid valve regurgitation is severe. 7. The aortic valve is normal in structure. Aortic valve regurgitation is not visualized. No aortic stenosis is present. Echo findings are consistent with normal structure and function of the aortic valve prosthesis. Aortic valve mean gradient measures 9.5 mmHg. Aortic valve Vmax measures 2.17 m/s. 8. The inferior vena cava is dilated in size with >50% respiratory variability, suggesting right  atrial pressure of 8 mmHg.  FINDINGS Left Ventricle: Left ventricular ejection fraction, by estimation, is 60 to 65%. The left ventricle has normal function. The left ventricle has no regional wall motion abnormalities. The left ventricular internal cavity size was normal in size. There is severe concentric left ventricular hypertrophy. Left ventricular diastolic parameters are indeterminate.  Right Ventricle: The right ventricular size is normal. No increase in right ventricular wall thickness. Right ventricular systolic function is normal. There is severely elevated pulmonary artery systolic pressure. The tricuspid regurgitant velocity is 4.11 m/s, and with an assumed right atrial pressure of 8 mmHg, the estimated right ventricular systolic pressure is 75.6 mmHg.  Left Atrium: Left atrial size was severely dilated.  Right Atrium: Right atrial size was mild to moderately dilated.  Pericardium: There is no  evidence of pericardial effusion.  Mitral Valve: The mitral valve is degenerative in appearance. There is moderate thickening of the mitral valve leaflet(s). Moderate mitral annular calcification. Severe mitral valve regurgitation. No evidence of mitral valve stenosis.  Tricuspid Valve: The tricuspid valve is normal in structure. Tricuspid valve regurgitation is severe. No evidence of tricuspid stenosis.  Aortic Valve: The aortic valve is normal in structure. Aortic valve regurgitation is not visualized. No aortic stenosis is present. Aortic valve mean gradient measures 9.5 mmHg. Aortic valve peak gradient measures 18.8 mmHg. There is a 23 mm Sapien prosthetic, stented (TAVR) valve present in the aortic position. Echo findings are consistent with normal structure and function of the aortic valve prosthesis.  Pulmonic Valve: The pulmonic valve was normal in structure. Pulmonic valve regurgitation is mild to moderate. No evidence of pulmonic stenosis.  Aorta: The aortic root is normal in size and structure.  Venous: The inferior vena cava is dilated in size with greater than 50% respiratory variability, suggesting right atrial pressure of 8 mmHg.  IAS/Shunts: No atrial level shunt detected by color flow Doppler.   LEFT VENTRICLE PLAX 2D LVIDd:         3.70 cm LVIDs:         2.50 cm LV PW:         0.80 cm LV IVS:        1.20 cm   RIGHT VENTRICLE RV Basal diam:  3.10 cm RV S prime:     6.41 cm/s TAPSE (M-mode): 0.9 cm RVSP:           75.6 mmHg  LEFT ATRIUM              Index        RIGHT ATRIUM           Index LA diam:        6.00 cm  3.64 cm/m   RA Pressure: 8.00 mmHg LA Vol (A2C):   100.0 ml 60.65 ml/m  RA Area:     15.80 cm LA Vol (A4C):   93.8 ml  56.89 ml/m  RA Volume:   34.90 ml  21.17 ml/m LA Biplane Vol: 98.5 ml  59.74 ml/m AORTIC VALVE                    PULMONIC VALVE AV Vmax:           216.75 cm/s  PR End Diast Vel: 6.35 msec AV Vmean:          138.000 cm/s AV  VTI:            0.517 m AV Peak Grad:      18.8 mmHg  AV Mean Grad:      9.5 mmHg LVOT Vmax:         96.27 cm/s LVOT Vmean:        56.300 cm/s LVOT VTI:          0.223 m LVOT/AV VTI ratio: 0.43  AORTA Ao Asc diam: 3.40 cm  MR Peak grad:    109.0 mmHg   TRICUSPID VALVE MR Mean grad:    69.0 mmHg    TR Peak grad:   67.6 mmHg MR Vmax:         522.00 cm/s  TR Vmax:        411.00 cm/s MR Vmean:        394.7 cm/s   Estimated RAP:  8.00 mmHg MR PISA:         1.57 cm     RVSP:           75.6 mmHg MR PISA Eff ROA: 12 mm MR PISA Radius:  0.50 cm      SHUNTS Systemic VTI: 0.22 m  Jules Oar MD Electronically signed by Jules Oar MD Signature Date/Time: 01/21/2024/4:50:36 PM    Final      CT SCANS  CT CORONARY MORPH W/CTA COR W/SCORE 10/17/2021  Addendum 10/17/2021  2:41 PM ADDENDUM REPORT: 10/17/2021 14:39  CLINICAL DATA:  Severe Aortic Stenosis.  EXAM: Cardiac TAVR CT  TECHNIQUE: A non-contrast, gated CT scan was obtained with axial slices of 3 mm through the heart for aortic valve calcium scoring. A 110 kV retrospective, gated, contrast cardiac scan was obtained. Gantry rotation speed was 250 msecs and collimation was 0.6 mm. Nitroglycerin  was not given. The 3D data set was reconstructed in 5% intervals of the 0-95% of the R-R cycle. Systolic and diastolic phases were analyzed on a dedicated workstation using MPR, MIP, and VRT modes. The patient received 100 cc of contrast.  FINDINGS: Image quality: Excellent.  Noise artifact is: Limited.  Valve Morphology: The aortic valve is tricuspid with severely diffusely calcified leaflets. Severely restricted leaflet motion in systole. There is bulky calcification of the NCC.  Aortic Valve Calcium score: 1627  Aortic annular dimension:  Phase assessed: 10%  Annular area: 450 mm2  Annular perimeter: 76.9 mm  Max diameter: 28.3 mm  Min diameter: 20.9 mm  Annular and subannular calcification: Single,  mild calcification under the NCC.  Membranous septum length: 9.6 mm  Optimal coplanar projection: LAO 16 CRA 4  Coronary Artery Height above Annulus:  Left Main: 16.7 mm  Right Coronary: 20.7 mm  Sinus of Valsalva Measurements:  Non-coronary: 36.2 mm  Right-coronary: 33.8 mm  Left-coronary: 34.5 mm  Sinus of Valsalva Height:  Non-coronary: 25.1 mm  Right-coronary: 25.7 mm  Left-coronary: 23.7 mm  Sinotubular Junction: 29 mm  Ascending Thoracic Aorta: 32 mm  Coronary Arteries: Normal coronary origin. Right dominance. The study was performed without use of NTG and is insufficient for plaque evaluation. Please refer to recent cardiac catheterization for coronary assessment. 3-vessel coronary calcifications.  Cardiac Morphology:  Right Atrium: Right atrial size is dilated.  Right Ventricle: The right ventricular cavity is dilated.  Left Atrium: Left atrial size is dilated. There is contrast mixing artifact in the LAA, cannot exclude thrombus.  Left Ventricle: The ventricular cavity size is within normal limits. There are no stigmata of prior infarction. There is no abnormal filling defect. Normal left ventricular function, EF=65%. No wall motion abnormalities.  Pulmonary arteries: Dilated, suggestive of pulmonary hypertension. No proximal filling defect.  Pulmonary  veins: Normal pulmonary venous drainage.  Pericardium: Normal thickness with no significant effusion or calcium present.  Mitral Valve: The mitral valve is normal structure without significant calcification.  Extra-cardiac findings: See attached radiology report for non-cardiac structures.  IMPRESSION: 1. Tricuspid aortic valve with bulky calcification of the NCC.  2. Annular measurements appropriate for 26 mm S3 TAVR (450 mm2).  3. Single, mild calcification under the NCC.  4. Sufficient coronary to annulus distance.  5. Optimal Fluoroscopic Angle for Delivery: LAO 16 CRA 4  6.  Dilated pulmonary artery suggestive of pulmonary hypertension.  7. There is contrast mixing artifact in the LAA, cannot exclude thrombus.  Melodee Spruce T. Rolm Clos, MD   Electronically Signed By: Jackquelyn Mass M.D. On: 10/17/2021 14:39  Narrative EXAM: OVER-READ INTERPRETATION  CT CHEST  The following report is an over-read performed by radiologist Dr. Alexandria Angel of [facility] on 10/17/2021. This over-read does not include interpretation of cardiac or coronary anatomy or pathology. The coronary calcium score/coronary CTA interpretation by the cardiologist is attached.  COMPARISON:  None.  FINDINGS: Extracardiac findings will be described separately under dictation for contemporaneously obtained CTA chest, abdomen and pelvis.  IMPRESSION: Please see separate dictation for contemporaneously obtained CTA chest, abdomen and pelvis dated 10/17/2021 for full description of relevant extracardiac findings.  Electronically Signed: By: Alexandria Angel M.D. On: 10/17/2021 11:01     ______________________________________________________________________________________________      EKG:   EKG Interpretation Date/Time:  Thursday March 17 2024 10:47:45 EDT Ventricular Rate:  38 PR Interval:    QRS Duration:  100 QT Interval:  538 QTC Calculation: 427 R Axis:   50  Text Interpretation: Atrial flutter with variable A-V block Incomplete right bundle branch block ST & T wave abnormality, consider anterior ischemia When compared with ECG of 16-Jan-2023 15:52, PREVIOUS ECG IS PRESENTthe heart rate is significantly slower Confirmed by Arnoldo Lapping [phone]) on 03/17/2024 12:37:14 PM    Recent Labs: 05/05/2023: BUN 23; Creatinine, Ser 1.26; Potassium 4.8; Sodium 142 05/19/2023: Hemoglobin 10.3; Platelets 128.0  Recent Lipid Panel    Component Value Date/Time   CHOL 149 12/09/2017 1457   TRIG 57.0 12/09/2017 1457   HDL 72.30 12/09/2017 1457   CHOLHDL 2 12/09/2017 1457    VLDL 11.4 12/09/2017 1457   LDLCALC 65 12/09/2017 1457     Risk Assessment/Calculations:    CHA2DS2-VASc Score = 5   This indicates a 7.2% annual risk of stroke. The patient's score is based upon: CHF History: 1 HTN History: 1 Diabetes History: 0 Stroke History: 0 Vascular Disease History: 1 Age Score: 2 Gender Score: 0              Physical Exam:    VS:  BP (!) 146/56 (BP Location: Right Arm)   Pulse (!) 38   Ht 5' 6 (1.676 m)   Wt 122 lb 9.6 oz (55.6 kg)   SpO2 96%   BMI 19.79 kg/m     Wt Readings from Last 3 Encounters:  03/17/24 122 lb 9.6 oz (55.6 kg)  03/16/24 121 lb 9.6 oz (55.2 kg)  08/17/23 127 lb (57.6 kg)     GEN:  Well nourished, well developed thin elderly male in no acute distress HEENT: Normal NECK: No JVD; No carotid bruits LYMPHATICS: No lymphadenopathy CARDIAC: Irregularly irregular with 2/6 ejection murmur at the right upper sternal border and 3/6 holosystolic murmur at the apex RESPIRATORY:  Clear to auscultation without rales, wheezing or rhonchi  ABDOMEN: Soft, non-tender, non-distended  MUSCULOSKELETAL:  No edema; No deformity  SKIN: Warm and dry NEUROLOGIC:  Alert and oriented x 3 PSYCHIATRIC:  Normal affect   Assessment & Plan S/P TAVR (transcatheter aortic valve replacement) Normal function of his TAVR prosthesis noted.  Continue low-dose apixaban . Persistent atrial fibrillation (HCC) The patient has slow atrial fibrillation with heart rate less than 40 bpm.  Discussed findings with patient and his son today.  Will discontinue low-dose metoprolol .  Will place a 3-day ZIO monitor.  He understands to seek immediate medical attention for any profound lightheadedness or near syncope.  At this point he does not appear to be symptomatic even with his very slow heart rate. Essential hypertension Blood pressure elevated today.  Will not make any changes at this time.  Hold metoprolol . Chronic diastolic CHF (congestive heart failure)  (HCC) Appears euvolemic on exam.  Continue furosemide  20 mg as needed Nonrheumatic mitral valve regurgitation Patient has severe mitral regurgitation.  At his advanced age of 75 and minimal symptoms, will not pursue any interventional therapies at this point.  Continue clinical follow-up.            Medication Adjustments/Labs and Tests Ordered: Current medicines are reviewed at length with the patient today.  Concerns regarding medicines are outlined above.  Orders Placed This Encounter  Procedures   LONG TERM MONITOR (3-14 DAYS)   EKG 12-Lead   No orders of the defined types were placed in this encounter.   Patient Instructions  Medication Instructions:  STOP Metoprolol    *If you need a refill on your cardiac medications before your next appointment, please call your pharmacy*  Testing/Procedures: 3 day zio monitor   Your physician has requested that you wear a Zio heart monitor for __3___ days. This will be mailed to your home with instructions on how to apply the monitor and how to return it when finished. Please allow 2 weeks after returning the heart monitor before our office calls you with the results.   Follow-Up: At [facility], you and your health needs are our priority.  As part of our continuing mission to provide you with exceptional heart care, our providers are all part of one team.  This team includes your primary Cardiologist (physician) and Advanced Practice Providers or APPs (Physician Assistants and Nurse Practitioners) who all work together to provide you with the care you need, when you need it.  Your next appointment:   6 month(s)  Provider:   One of our Advanced Practice Providers (APPs): Melita Springer, PA-C  Friddie Jetty, NP Evaline Hill, NP  Theotis Flake, PA-C Lawana Pray, NP  Willis Harter, PA-C Lovette Rud, PA-C  [place], PA-C Ernest Dick, NP  Marlana Silvan, NP Marcie Sever, PA-C  Laquita Plant, PA-C    Dayna Dunn,  PA-C  Scott Weaver, PA-C Palmer Bobo, NP Katlyn West, NP Callie Goodrich, PA-C  Evan Williams, PA-C Sheng Haley, PA-C  Xika Zhao, NP Kathleen Johnson, PA-C   Then, Arnoldo Lapping, MD will plan to see you again in 1 year(s).    We recommend signing up for the patient portal called MyChart.  Sign up information is provided on this After Visit Summary.  MyChart is used to connect with patients for Virtual Visits (Telemedicine).  Patients are able to view lab/test results, encounter notes, upcoming appointments, etc.  Non-urgent messages can be sent to your provider as well.   To learn more about what you can do with MyChart, go to [email or web address].  Signed, Arnoldo Lapping, MD  03/17/2024 12:40 PM    [facility]

## 2024-03-18 DIAGNOSIS — Z515 Encounter for palliative care: Secondary | ICD-10-CM | POA: Diagnosis not present

## 2024-03-22 ENCOUNTER — Other Ambulatory Visit: Payer: Self-pay | Admitting: Internal Medicine

## 2024-03-22 ENCOUNTER — Encounter: Payer: Self-pay | Admitting: Internal Medicine

## 2024-03-22 NOTE — Progress Notes (Signed)
 Subjective:    Patient ID: Paul Mays, male    DOB: [birth year], 89 y.o.   MRN: [id number]     HPI Paul Mays is here for follow up of his chronic medical problems.  He is here today with his son.  B/l shoulder pain-this is chronic, but he states that has been worse recently and he would like to get injections.  Takes aleve nightly.  He is on Eliquis .  The Aleve helps him sleep.  Has had some rectal bleeding.  Medications and allergies reviewed with patient and updated if appropriate.  Current Outpatient Medications on File Prior to Visit  Medication Sig Dispense Refill   acetaminophen  (TYLENOL ) 325 MG tablet GIVE 2 TABS(650MG ) BY MOUTH 2 TIMES DAILY  MAY GIVE 2 TABS(650MG ) EVERY 6 HOURS AS NEEDED FOR PAIN (Q6H BETWEEN SCHEDULED DOSES) MAX 3000MG  DAILY 240 tablet 11   allopurinol  (ZYLOPRIM ) 300 MG tablet TAKE 1 TABLET(300 MG) BY MOUTH DAILY AS NEEDED FOR GOUT FLARE 90 tablet 0   amoxicillin  (AMOXIL ) 500 MG tablet GIVE 4 TABS (2000MG ) BY MOUTH 1H BEFORE APPT 4 tablet 2   ANTI-DIARRHEAL 2 MG tablet GIVE 1 TAB BY MOUTH 4 TIMES DAILY AS NEEDED FOR DIARRHEA OR LOOSE STOOLS 30 tablet 11   Bismuth Subsalicylate (PEPTO BISMOL PO) Take by mouth as needed. Prn as needed for upset stomach     Cholecalciferol  (VITAMIN D3) 50 MCG (2000 UT) capsule Take 1 capsule (2,000 Units total) by mouth daily. 30 capsule 11   ELIQUIS  2.5 MG TABS tablet GIVE 1 TAB BY MOUTH TWICE DAILY 60 tablet 11   furosemide  (LASIX ) 20 MG tablet Take 1 tablet (20 mg total) by mouth daily as needed for fluid. 90 tablet 3   iron  polysaccharides (NIFEREX) 150 MG capsule TAKE 1 CAP CAPSULE (150 MG TOTAL) BY MOUTH DAILY. 30 capsule 1   oxybutynin  (DITROPAN ) 5 MG tablet Take 2 tablets (10 mg total) by mouth every 8 (eight) hours as needed for bladder spasms. 20 tablet 0   potassium chloride  (KLOR-CON  M) 10 MEQ tablet Take 1 tablet (10 mEq total) by mouth daily as needed (ONLY WHEN TAKING FUROSEMIDE ). Take 1 tablet by mouth  daily (with Furosemide ) 90 tablet 3   pravastatin  (PRAVACHOL ) 40 MG tablet GIVE 1 TAB BY MOUTH ONCE DAILY 30 tablet 5   QUEtiapine  (SEROQUEL ) 25 MG tablet GIVE 1 TAB BY MOUTH AT BEDTIME 30 tablet 3   vitamin B-12 (CYANOCOBALAMIN ) 500 MCG tablet Take 1 tablet (500 mcg total) by mouth daily. 30 tablet 11   No current facility-administered medications on file prior to visit.     Review of Systems  Constitutional:  Negative for fever.  Respiratory:  Negative for shortness of breath and wheezing.   Cardiovascular:  Negative for chest pain and palpitations. Leg swelling: mild. Gastrointestinal:  Positive for blood in stool (when he wipes - not in stool) and constipation (mild - stool is hard). Negative for abdominal pain and diarrhea.  Musculoskeletal:  Positive for arthralgias (b/l shoulders).  Neurological:  Positive for light-headedness. Negative for dizziness and headaches.  Psychiatric/Behavioral:  Negative for agitation and confusion.        Objective:   Vitals:   03/23/24 1436  BP: 136/68  Pulse: 68  Temp: 98.3 F (36.8 C)  SpO2: 96%   BP Readings from Last 3 Encounters:  03/23/24 136/68  03/17/24 (!) 146/56  03/16/24 132/68   Wt Readings from Last 3 Encounters:  03/23/24 123 lb (55.8  kg)  03/17/24 122 lb 9.6 oz (55.6 kg)  03/16/24 121 lb 9.6 oz (55.2 kg)   Body mass index is 19.85 kg/m.    Physical Exam Constitutional:      General: He is not in acute distress.    Appearance: Normal appearance. He is not ill-appearing.  HENT:     Head: Normocephalic and atraumatic.   Eyes:     Conjunctiva/sclera: Conjunctivae normal.    Cardiovascular:     Rate and Rhythm: Normal rate and regular rhythm.     Heart sounds: Murmur heard.  Pulmonary:     Effort: Pulmonary effort is normal. No respiratory distress.     Breath sounds: Normal breath sounds. No wheezing or rales.   Musculoskeletal:     Right lower leg: Edema (Trace-ankle) present.     Left lower leg: Edema  (Trace-ankle) present.   Skin:    General: Skin is warm and dry.     Findings: No rash.   Neurological:     Mental Status: He is alert. Mental status is at baseline.   Psychiatric:        Mood and Affect: Mood normal.        Lab Results  Component Value Date   WBC 5.8 05/19/2023   HGB 10.3 (L) 05/19/2023   HCT 32.6 (L) 05/19/2023   PLT 128.0 (L) 05/19/2023   GLUCOSE 123 (H) 05/05/2023   CHOL 149 12/09/2017   TRIG 57.0 12/09/2017   HDL 72.30 12/09/2017   LDLCALC 65 12/09/2017   ALT 10 03/13/2023   AST 16 03/13/2023   NA 142 05/05/2023   K 4.8 05/05/2023   CL 105 05/05/2023   CREATININE 1.26 05/05/2023   BUN 23 05/05/2023   CO2 24 05/05/2023   TSH 2.34 02/19/2021   PSA 0.00 (L) 02/19/2021   INR 1.3 (H) 01/25/2023   HGBA1C 4.9 11/08/2021     Assessment & Plan:    See Problem List for Assessment and Plan of chronic medical problems.

## 2024-03-22 NOTE — Patient Instructions (Addendum)
      Blood work was ordered.       Medications changes include :   colace 100 mg daily for constipation.   STOP Aleve.  Start tylenol  2 tabs at bedtime and as needed.       Return in about 6 months (around 09/22/2024) for follow up.

## 2024-03-23 ENCOUNTER — Ambulatory Visit: Admitting: Internal Medicine

## 2024-03-23 ENCOUNTER — Ambulatory Visit: Payer: Self-pay | Admitting: Internal Medicine

## 2024-03-23 VITALS — BP 136/68 | HR 68 | Temp 98.3°F | Ht 66.0 in | Wt 123.0 lb

## 2024-03-23 DIAGNOSIS — E7849 Other hyperlipidemia: Secondary | ICD-10-CM

## 2024-03-23 DIAGNOSIS — M109 Gout, unspecified: Secondary | ICD-10-CM

## 2024-03-23 DIAGNOSIS — I10 Essential (primary) hypertension: Secondary | ICD-10-CM

## 2024-03-23 DIAGNOSIS — I480 Paroxysmal atrial fibrillation: Secondary | ICD-10-CM

## 2024-03-23 DIAGNOSIS — R451 Restlessness and agitation: Secondary | ICD-10-CM | POA: Diagnosis not present

## 2024-03-23 DIAGNOSIS — R600 Localized edema: Secondary | ICD-10-CM

## 2024-03-23 DIAGNOSIS — K625 Hemorrhage of anus and rectum: Secondary | ICD-10-CM | POA: Diagnosis not present

## 2024-03-23 DIAGNOSIS — N1831 Chronic kidney disease, stage 3a: Secondary | ICD-10-CM

## 2024-03-23 DIAGNOSIS — D649 Anemia, unspecified: Secondary | ICD-10-CM | POA: Diagnosis not present

## 2024-03-23 LAB — LIPID PANEL
Cholesterol: 129 mg/dL (ref 0–200)
HDL: 49.3 mg/dL (ref >39.00)
LDL Cholesterol: 70 mg/dL (ref 0–99)
NonHDL: 79.89
Total CHOL/HDL Ratio: 3
Triglycerides: 51 mg/dL (ref 0.0–149.0)
VLDL: 10.2 mg/dL (ref 0.0–40.0)

## 2024-03-23 LAB — CBC WITH DIFFERENTIAL/PLATELET
Basophils Absolute: 0 10*3/uL (ref 0.0–0.1)
Basophils Relative: 1 % (ref 0.0–3.0)
Eosinophils Absolute: 0.1 10*3/uL (ref 0.0–0.7)
Eosinophils Relative: 2.4 % (ref 0.0–5.0)
HCT: 33.6 % — ABNORMAL LOW (ref 39.0–52.0)
Hemoglobin: 11.3 g/dL — ABNORMAL LOW (ref 13.0–17.0)
Lymphocytes Relative: 19.5 % (ref 12.0–46.0)
Lymphs Abs: 0.9 10*3/uL (ref 0.7–4.0)
MCHC: 33.7 g/dL (ref 30.0–36.0)
MCV: 94.5 fl (ref 78.0–100.0)
Monocytes Absolute: 0.7 10*3/uL (ref 0.1–1.0)
Monocytes Relative: 15.6 % — ABNORMAL HIGH (ref 3.0–12.0)
Neutro Abs: 2.9 10*3/uL (ref 1.4–7.7)
Neutrophils Relative %: 61.5 % (ref 43.0–77.0)
Platelets: 121 10*3/uL — ABNORMAL LOW (ref 150.0–400.0)
RBC: 3.55 Mil/uL — ABNORMAL LOW (ref 4.22–5.81)
RDW: 15.2 % (ref 11.5–15.5)
WBC: 4.8 10*3/uL (ref 4.0–10.5)

## 2024-03-23 LAB — IBC PANEL
Iron: 127 ug/dL (ref 42–165)
Saturation Ratios: 38 % (ref 20.0–50.0)
TIBC: 334.6 ug/dL (ref 250.0–450.0)
Transferrin: 239 mg/dL (ref 212.0–360.0)

## 2024-03-23 LAB — COMPREHENSIVE METABOLIC PANEL WITH GFR
ALT: 15 U/L (ref 0–53)
AST: 18 U/L (ref 0–37)
Albumin: 4.2 g/dL (ref 3.5–5.2)
Alkaline Phosphatase: 38 U/L — ABNORMAL LOW (ref 39–117)
BUN: 27 mg/dL — ABNORMAL HIGH (ref 6–23)
CO2: 29 meq/L (ref 19–32)
Calcium: 9.5 mg/dL (ref 8.4–10.5)
Chloride: 105 meq/L (ref 96–112)
Creatinine, Ser: 1.49 mg/dL (ref 0.40–1.50)
GFR: 39.97 mL/min — ABNORMAL LOW (ref >60.00)
Glucose, Bld: 96 mg/dL (ref 70–99)
Potassium: 4.8 meq/L (ref 3.5–5.1)
Sodium: 139 meq/L (ref 135–145)
Total Bilirubin: 2.5 mg/dL — ABNORMAL HIGH (ref 0.2–1.2)
Total Protein: 6.8 g/dL (ref 6.0–8.3)

## 2024-03-23 LAB — TSH: TSH: 1.8 u[IU]/mL (ref 0.35–5.50)

## 2024-03-23 LAB — FERRITIN: Ferritin: 81.9 ng/mL (ref 22.0–322.0)

## 2024-03-23 MED ORDER — DOCUSATE SODIUM 100 MG PO CAPS: 100.0000 mg | ORAL_CAPSULE | Freq: Every day | ORAL | 3 refills | Status: AC

## 2024-03-23 MED ORDER — ACETAMINOPHEN 325 MG PO TABS: ORAL_TABLET | ORAL | 11 refills | Status: DC

## 2024-03-23 MED ORDER — ACETAMINOPHEN 325 MG PO TABS: ORAL_TABLET | ORAL | 11 refills | Status: AC

## 2024-03-23 MED ORDER — DOCUSATE SODIUM 100 MG PO CAPS
100.0000 mg | ORAL_CAPSULE | Freq: Every day | ORAL | 3 refills | Status: DC
Start: 2024-03-23 — End: 2024-03-23

## 2024-03-23 NOTE — Assessment & Plan Note (Signed)
 Chronic Likely related to CKD and anemia of chronic disease He has also been taking Aleve nightly in addition to the Eliquis  which is concerning for increased risk of bleeding-advised him to stop the Aleve and only take Tylenol  He has been having some rectal bleeding which is likely hemorrhoidal in nature CBC, iron  panel

## 2024-03-23 NOTE — Assessment & Plan Note (Addendum)
 Chronic Controlled Likely has some memory issues, but has not been evaluated. Check TSH Continue Seroquel  25 mg nightly

## 2024-03-23 NOTE — Assessment & Plan Note (Signed)
 Chronic On Eliquis   2.5 mg twice daily On metoprolol  25 mg twice daily Following with cardiology  CBC, CMP, TSH

## 2024-03-23 NOTE — Assessment & Plan Note (Addendum)
 Chronic Controlled-denies any gout flares ?  taking allopurinol  300 mg a day

## 2024-03-23 NOTE — Assessment & Plan Note (Signed)
Chronic controlled Bilateral edema lower extremities Continue Lasix 20 mg daily as needed

## 2024-03-23 NOTE — Assessment & Plan Note (Signed)
 Chronic BP controlled CMP Continue metoprolol  12.5 mg twice daily

## 2024-03-23 NOTE — Assessment & Plan Note (Signed)
 Chronic CBC, CMP Improved with Lasix  as needed only

## 2024-03-23 NOTE — Assessment & Plan Note (Signed)
 Chronic Check lipid panel, CMP Continue pravastatin  40 mg daily

## 2024-03-23 NOTE — Assessment & Plan Note (Signed)
 Acute States he is having rectal bleeding-only when he wipes, not in the stool Has history of hemorrhoids and he has had some constipation and states he has to strain so the bleeding is likely hemorrhoidal in nature Has mild constipation Start Colace 100 mg daily-discussed that we can adjust this if needed to better control the constipation which should help the bleeding which she does not have on a daily basis CBC Continue Eliquis  unless CBC is much lower or bleeding does not stop

## 2024-03-29 ENCOUNTER — Ambulatory Visit (INDEPENDENT_AMBULATORY_CARE_PROVIDER_SITE_OTHER): Admitting: Family Medicine

## 2024-03-29 ENCOUNTER — Encounter: Payer: Self-pay | Admitting: Family Medicine

## 2024-03-29 ENCOUNTER — Other Ambulatory Visit: Payer: Self-pay

## 2024-03-29 VITALS — BP 128/82 | HR 61 | Ht 66.0 in | Wt 122.0 lb

## 2024-03-29 DIAGNOSIS — M25512 Pain in left shoulder: Secondary | ICD-10-CM

## 2024-03-29 DIAGNOSIS — G8929 Other chronic pain: Secondary | ICD-10-CM

## 2024-03-29 DIAGNOSIS — M75102 Unspecified rotator cuff tear or rupture of left shoulder, not specified as traumatic: Secondary | ICD-10-CM | POA: Diagnosis not present

## 2024-03-29 DIAGNOSIS — M25511 Pain in right shoulder: Secondary | ICD-10-CM | POA: Diagnosis not present

## 2024-03-29 DIAGNOSIS — M12812 Other specific arthropathies, not elsewhere classified, left shoulder: Secondary | ICD-10-CM

## 2024-03-29 DIAGNOSIS — M75101 Unspecified rotator cuff tear or rupture of right shoulder, not specified as traumatic: Secondary | ICD-10-CM

## 2024-03-29 DIAGNOSIS — M12811 Other specific arthropathies, not elsewhere classified, right shoulder: Secondary | ICD-10-CM | POA: Diagnosis not present

## 2024-03-29 NOTE — Patient Instructions (Signed)
 Thank you for coming in today.   You received an injection today. Seek immediate medical attention if the joint becomes red, extremely painful, or is oozing fluid.   See you back as needed.

## 2024-03-29 NOTE — Progress Notes (Signed)
 I, Paul Mays, CMA acting as a scribe for Paul Lloyd, MD.  Paul Mays is a 89 y.o. male who presents to [facility] today for exacerbation of his bilat shoulder pain. Pt was last seen by Dr. Lloyd on 03/06/21 and was given bilat GH steroid injections.  Today, pt reports exacerbation of bliat shoulder pain. Also notes that the fingers will lock up. Locates shoulder pain to entire joint. Limited ROM. Aching into the upper arms. Notes overall stiffness. Denies n/t in the hands. Denies weakness.   Dx imaging: 03/06/21 R & L shoulder XR  Pertinent review of systems: No fevers or chills  Relevant historical information: Atrial fibrillation.  Heart failure.  CAD.  Ulcerative colitis.  Protein calorie malnutrition.  Prostate cancer.  CKD.   Exam:  BP 128/82   Pulse 61   Ht 5' 6 (1.676 m)   Wt 122 lb (55.3 kg)   SpO2 98%   BMI 19.69 kg/m  General: Elderly male no acute distress.  Decreased muscle mass.  Decreased range of motion shoulders bilaterally.     Lab and Radiology Results  Procedure: Real-time Ultrasound Guided Injection of left shoulder glenohumeral joint posterior approach Device: Philips Affiniti 50G/GE Logiq Images permanently stored and available for review in PACS Verbal informed consent obtained.  Discussed risks and benefits of procedure. Warned about infection, bleeding, hyperglycemia damage to structures among others. Patient expresses understanding and agreement Time-out conducted.   Noted no overlying erythema, induration, or other signs of local infection.   Skin prepped in a sterile fashion.   Local anesthesia: Topical Ethyl chloride.   With sterile technique and under real time ultrasound guidance: 40 mg of Kenalog  and 2 ml of Marcaine injected into shoulder joint. Fluid seen entering the joint capsule.   Completed without difficulty   Pain immediately resolved suggesting accurate placement of the medication.   Advised  to call if fevers/chills, erythema, induration, drainage, or persistent bleeding.   Images permanently stored and available for review in the ultrasound unit.  Impression: Technically successful ultrasound guided injection.    Procedure: Real-time Ultrasound Guided Injection of right shoulder glenohumeral joint posterior approach Device: Philips Affiniti 50G/GE Logiq Images permanently stored and available for review in PACS Verbal informed consent obtained.  Discussed risks and benefits of procedure. Warned about infection, bleeding, hyperglycemia damage to structures among others. Patient expresses understanding and agreement Time-out conducted.   Noted no overlying erythema, induration, or other signs of local infection.   Skin prepped in a sterile fashion.   Local anesthesia: Topical Ethyl chloride.   With sterile technique and under real time ultrasound guidance: 40 mg of Kenalog  and 2 mL of Marcaine injected into shoulder joint. Fluid seen entering the joint capsule.   Completed without difficulty   Pain immediately resolved suggesting accurate placement of the medication.   Advised to call if fevers/chills, erythema, induration, drainage, or persistent bleeding.   Images permanently stored and available for review in the ultrasound unit.  Impression: Technically successful ultrasound guided injection.       Assessment and Plan: 89 y.o. male with bilateral shoulder pain due to DJD.  Plan for glenohumeral injection bilaterally today.  Check back as needed.   PDMP not reviewed this encounter. Orders Placed This Encounter  Procedures   US  LIMITED JOINT SPACE STRUCTURES UP BILAT(NO LINKED CHARGES)    Reason for Exam (SYMPTOM  OR DIAGNOSIS REQUIRED):   bilat shoulder pain    Preferred imaging location?:  [facility]   No orders of the defined types were placed in this encounter.    Discussed warning signs or symptoms. Please see discharge  instructions. Patient expresses understanding.   The above documentation has been reviewed and is accurate and complete Paul Mays, M.D.

## 2024-04-18 DIAGNOSIS — Z515 Encounter for palliative care: Secondary | ICD-10-CM | POA: Diagnosis not present

## 2024-04-26 ENCOUNTER — Telehealth: Payer: Self-pay

## 2024-04-26 NOTE — Telephone Encounter (Signed)
 Copied from CRM [id number]. Topic: General - Other >> Apr 26, 2024 11:56 AM Franky GRADE wrote: Reason for CRM: Karna Fiddler from [facility] at [facility] is calling to confirm if the [facility] documentation was received by the office faxed on 04/25/2024. [phone] Best call back number:[phone].

## 2024-04-28 NOTE — Telephone Encounter (Signed)
 Form faxed back today with copy of medication list.

## 2024-04-28 NOTE — Telephone Encounter (Signed)
 Forms received yesterday afternoon.  Will try to complete today and fax back.

## 2024-04-28 NOTE — Telephone Encounter (Signed)
 Paul Mays called in to inquire if the form has been completed. Informed her of our 7-10 business day turn around time for pw.   Paul Mays is requesting the pw be completed ASAP because she is trying to get the pt and spouse admitted to assistant living.   Fax:[phone]  Best call back number:[phone].

## 2024-05-02 DIAGNOSIS — Z85828 Personal history of other malignant neoplasm of skin: Secondary | ICD-10-CM | POA: Diagnosis not present

## 2024-05-02 DIAGNOSIS — D0339 Melanoma in situ of other parts of face: Secondary | ICD-10-CM | POA: Diagnosis not present

## 2024-06-01 DIAGNOSIS — H01115 Allergic dermatitis of left lower eyelid: Secondary | ICD-10-CM | POA: Diagnosis not present

## 2024-06-01 DIAGNOSIS — H01114 Allergic dermatitis of left upper eyelid: Secondary | ICD-10-CM | POA: Diagnosis not present

## 2024-06-01 DIAGNOSIS — H0100B Unspecified blepharitis left eye, upper and lower eyelids: Secondary | ICD-10-CM | POA: Diagnosis not present

## 2024-06-15 DIAGNOSIS — H01002 Unspecified blepharitis right lower eyelid: Secondary | ICD-10-CM | POA: Diagnosis not present

## 2024-06-29 DIAGNOSIS — Z23 Encounter for immunization: Secondary | ICD-10-CM | POA: Diagnosis not present

## 2024-07-01 ENCOUNTER — Telehealth: Payer: Self-pay | Admitting: Radiology

## 2024-07-01 DIAGNOSIS — N1831 Chronic kidney disease, stage 3a: Secondary | ICD-10-CM

## 2024-07-01 DIAGNOSIS — I5032 Chronic diastolic (congestive) heart failure: Secondary | ICD-10-CM

## 2024-07-01 DIAGNOSIS — G609 Hereditary and idiopathic neuropathy, unspecified: Secondary | ICD-10-CM

## 2024-07-01 DIAGNOSIS — I480 Paroxysmal atrial fibrillation: Secondary | ICD-10-CM

## 2024-07-01 NOTE — Telephone Encounter (Signed)
 Copied from CRM #[id number]. Topic: General - Other >> Jul 01, 2024 11:30 AM Rosina BIRCH wrote: Reason for CRM: kristin from [facility] called stating the patient moved from [facility] to [facility] and the family request to have a palliative order on chart [phone]

## 2024-07-02 NOTE — Telephone Encounter (Signed)
 Referral ordered

## 2024-07-02 NOTE — Addendum Note (Signed)
 Addended by: GEOFM GLADE PARAS on: 07/02/2024 03:36 PM   Modules accepted: Orders

## 2024-07-05 DIAGNOSIS — Z85820 Personal history of malignant melanoma of skin: Secondary | ICD-10-CM | POA: Diagnosis not present

## 2024-07-05 DIAGNOSIS — Z85828 Personal history of other malignant neoplasm of skin: Secondary | ICD-10-CM | POA: Diagnosis not present

## 2024-07-05 DIAGNOSIS — D0339 Melanoma in situ of other parts of face: Secondary | ICD-10-CM | POA: Diagnosis not present

## 2024-07-18 ENCOUNTER — Telehealth: Payer: Self-pay

## 2024-07-18 NOTE — Telephone Encounter (Signed)
 Copied from CRM [id number]. Topic: Clinical - Medication Question >> Jul 18, 2024 10:19 AM Ivette P wrote: Reason for CRM: Pt son Dennie is requesting to speak to Tobias about pt long term care medical assessment   Direct message from Elsie Raddle.  - [facility] is conducting a physical assessment on dad and should indicate that he will require attendant assistance with toileting and showering for fear of falling. Please confirm on the assessment paperwork if any questions please call me  Pls call Davontae Prusinski back at [phone]

## 2024-07-21 NOTE — Telephone Encounter (Signed)
Paperwork faxed back today.

## 2024-08-23 DIAGNOSIS — R351 Nocturia: Secondary | ICD-10-CM | POA: Diagnosis not present

## 2024-08-23 DIAGNOSIS — N401 Enlarged prostate with lower urinary tract symptoms: Secondary | ICD-10-CM | POA: Diagnosis not present

## 2024-08-23 DIAGNOSIS — R300 Dysuria: Secondary | ICD-10-CM | POA: Diagnosis not present

## 2024-09-18 ENCOUNTER — Encounter: Payer: Self-pay | Admitting: Internal Medicine

## 2024-09-18 NOTE — Progress Notes (Unsigned)
 Subjective:    Patient ID: Paul Mays, male    DOB: 07-27-1928, 89 y.o.   MRN: [id number]     HPI Paul Mays is here for follow up of his chronic medical problems.    Medications and allergies reviewed with patient and updated if appropriate.  Medications Ordered Prior to Encounter[1]   Review of Systems     Objective:  There were no vitals filed for this visit. BP Readings from Last 3 Encounters:  03/29/24 128/82  03/23/24 136/68  03/17/24 (!) 146/56   Wt Readings from Last 3 Encounters:  03/29/24 122 lb (55.3 kg)  03/23/24 123 lb (55.8 kg)  03/17/24 122 lb 9.6 oz (55.6 kg)   There is no height or weight on file to calculate BMI.    Physical Exam     Lab Results  Component Value Date   WBC 4.8 03/23/2024   HGB 11.3 (L) 03/23/2024   HCT 33.6 (L) 03/23/2024   PLT 121.0 (L) 03/23/2024   GLUCOSE 96 03/23/2024   CHOL 129 03/23/2024   TRIG 51.0 03/23/2024   HDL 49.30 03/23/2024   LDLCALC 70 03/23/2024   ALT 15 03/23/2024   AST 18 03/23/2024   NA 139 03/23/2024   K 4.8 03/23/2024   CL 105 03/23/2024   CREATININE 1.49 03/23/2024   BUN 27 (H) 03/23/2024   CO2 29 03/23/2024   TSH 1.80 03/23/2024   PSA 0.00 (L) 02/19/2021   INR 1.3 (H) 01/25/2023   HGBA1C 4.9 11/08/2021     Assessment & Plan:    See Problem List for Assessment and Plan of chronic medical problems.       [1]  Current Outpatient Medications on File Prior to Visit  Medication Sig Dispense Refill   acetaminophen  (TYLENOL ) 325 MG tablet GIVE 2 TABS(650MG ) BY MOUTH HS.  AND EVERY 6 HOURS AS NEEDED FOR PAIN OR FEVER ( MAX 3000 MG DAILY 240 tablet 11   allopurinol  (ZYLOPRIM ) 300 MG tablet TAKE 1 TABLET(300 MG) BY MOUTH DAILY AS NEEDED FOR GOUT FLARE 90 tablet 0   amoxicillin  (AMOXIL ) 500 MG tablet GIVE 4 TABS (2000MG ) BY MOUTH 1H BEFORE APPT 4 tablet 2   ANTI-DIARRHEAL 2 MG tablet GIVE 1 TAB BY MOUTH 4 TIMES DAILY AS NEEDED FOR DIARRHEA OR LOOSE STOOLS 30 tablet 11   Bismuth  Subsalicylate (PEPTO BISMOL PO) Take by mouth as needed. Prn as needed for upset stomach     Cholecalciferol  (VITAMIN D3) 50 MCG (2000 UT) capsule Take 1 capsule (2,000 Units total) by mouth daily. 30 capsule 11   docusate sodium  (COLACE) 100 MG capsule Take 1 capsule (100 mg total) by mouth daily. 90 capsule 3   ELIQUIS  2.5 MG TABS tablet GIVE 1 TAB BY MOUTH TWICE DAILY 60 tablet 11   furosemide  (LASIX ) 20 MG tablet Take 1 tablet (20 mg total) by mouth daily as needed for fluid. 90 tablet 3   iron  polysaccharides (NIFEREX) 150 MG capsule TAKE 1 CAP CAPSULE (150 MG TOTAL) BY MOUTH DAILY. 30 capsule 1   oxybutynin  (DITROPAN ) 5 MG tablet Take 2 tablets (10 mg total) by mouth every 8 (eight) hours as needed for bladder spasms. 20 tablet 0   potassium chloride  (KLOR-CON  M) 10 MEQ tablet Take 1 tablet (10 mEq total) by mouth daily as needed (ONLY WHEN TAKING FUROSEMIDE ). Take 1 tablet by mouth daily (with Furosemide ) 90 tablet 3   pravastatin  (PRAVACHOL ) 40 MG tablet GIVE 1 TAB BY MOUTH ONCE  DAILY 30 tablet 5   QUEtiapine  (SEROQUEL ) 25 MG tablet GIVE 1 TAB BY MOUTH AT BEDTIME 30 tablet 3   vitamin B-12 (CYANOCOBALAMIN ) 500 MCG tablet Take 1 tablet (500 mcg total) by mouth daily. 30 tablet 11   No current facility-administered medications on file prior to visit.

## 2024-09-18 NOTE — Patient Instructions (Addendum)
° ° ° ° °  Blood work was ordered.       Medications changes include :   diclofenac  gel 3% twice daily as needed     Return in about 6 months (around 03/20/2025) for follow up.

## 2024-09-19 ENCOUNTER — Ambulatory Visit: Admitting: Internal Medicine

## 2024-09-19 ENCOUNTER — Telehealth: Payer: Self-pay

## 2024-09-19 VITALS — BP 130/78 | HR 58 | Temp 98.0°F | Ht 66.0 in | Wt 123.0 lb

## 2024-09-19 DIAGNOSIS — E7800 Pure hypercholesterolemia, unspecified: Secondary | ICD-10-CM | POA: Diagnosis not present

## 2024-09-19 DIAGNOSIS — M109 Gout, unspecified: Secondary | ICD-10-CM | POA: Diagnosis not present

## 2024-09-19 DIAGNOSIS — M19041 Primary osteoarthritis, right hand: Secondary | ICD-10-CM

## 2024-09-19 DIAGNOSIS — I480 Paroxysmal atrial fibrillation: Secondary | ICD-10-CM

## 2024-09-19 DIAGNOSIS — N1832 Chronic kidney disease, stage 3b: Secondary | ICD-10-CM | POA: Diagnosis not present

## 2024-09-19 DIAGNOSIS — I25810 Atherosclerosis of coronary artery bypass graft(s) without angina pectoris: Secondary | ICD-10-CM

## 2024-09-19 DIAGNOSIS — R300 Dysuria: Secondary | ICD-10-CM

## 2024-09-19 DIAGNOSIS — D649 Anemia, unspecified: Secondary | ICD-10-CM

## 2024-09-19 DIAGNOSIS — R451 Restlessness and agitation: Secondary | ICD-10-CM

## 2024-09-19 DIAGNOSIS — M19042 Primary osteoarthritis, left hand: Secondary | ICD-10-CM | POA: Diagnosis not present

## 2024-09-19 DIAGNOSIS — I5032 Chronic diastolic (congestive) heart failure: Secondary | ICD-10-CM | POA: Diagnosis not present

## 2024-09-19 LAB — URINALYSIS, ROUTINE W REFLEX MICROSCOPIC
Bilirubin Urine: NEGATIVE
Hgb urine dipstick: NEGATIVE
Ketones, ur: NEGATIVE
Leukocytes,Ua: NEGATIVE
Nitrite: NEGATIVE
RBC / HPF: NONE SEEN (ref 0–?)
Specific Gravity, Urine: 1.02 (ref 1.000–1.030)
Total Protein, Urine: NEGATIVE
Urine Glucose: NEGATIVE
Urobilinogen, UA: 0.2 (ref 0.0–1.0)
pH: 6 (ref 5.0–8.0)

## 2024-09-19 LAB — CBC
HCT: 38.7 % — ABNORMAL LOW (ref 39.0–52.0)
Hemoglobin: 13.2 g/dL (ref 13.0–17.0)
MCHC: 34.1 g/dL (ref 30.0–36.0)
MCV: 90.9 fl (ref 78.0–100.0)
Platelets: 105 K/uL — ABNORMAL LOW (ref 150.0–400.0)
RBC: 4.26 Mil/uL (ref 4.22–5.81)
RDW: 14.3 % (ref 11.5–15.5)
WBC: 5.1 K/uL (ref 4.0–10.5)

## 2024-09-19 LAB — COMPREHENSIVE METABOLIC PANEL WITH GFR
ALT: 18 U/L (ref 0–53)
AST: 21 U/L (ref 0–37)
Albumin: 4.6 g/dL (ref 3.5–5.2)
Alkaline Phosphatase: 37 U/L — ABNORMAL LOW (ref 39–117)
BUN: 31 mg/dL — ABNORMAL HIGH (ref 6–23)
CO2: 29 meq/L (ref 19–32)
Calcium: 9.8 mg/dL (ref 8.4–10.5)
Chloride: 103 meq/L (ref 96–112)
Creatinine, Ser: 1.4 mg/dL (ref 0.40–1.50)
GFR: 42.92 mL/min — ABNORMAL LOW (ref >60.00)
Glucose, Bld: 96 mg/dL (ref 70–99)
Potassium: 4.7 meq/L (ref 3.5–5.1)
Sodium: 139 meq/L (ref 135–145)
Total Bilirubin: 1.9 mg/dL — ABNORMAL HIGH (ref 0.2–1.2)
Total Protein: 7 g/dL (ref 6.0–8.3)

## 2024-09-19 LAB — LIPID PANEL
Cholesterol: 155 mg/dL (ref 0–200)
HDL: 60.5 mg/dL (ref >39.00)
LDL Cholesterol: 86 mg/dL (ref 0–99)
NonHDL: 94.82
Total CHOL/HDL Ratio: 3
Triglycerides: 46 mg/dL (ref 0.0–149.0)
VLDL: 9.2 mg/dL (ref 0.0–40.0)

## 2024-09-19 MED ORDER — DICLOFENAC SODIUM 3 % EX GEL: 1.0000 | Freq: Two times a day (BID) | CUTANEOUS | 5 refills | Status: DC | PRN

## 2024-09-19 NOTE — Assessment & Plan Note (Signed)
 Chronic Likely related to CKD and anemia of chronic disease He has also been taking Aleve on occasion because that is healing and it works for his arthritis Will try to get diclofenac  gel 3% approved by his insurance company for his severe OA so he can stop taking oral NSAIDs CBC

## 2024-09-19 NOTE — Assessment & Plan Note (Signed)
 Subacute About 4 weeks ago saw urology and had a urinary tract infection-symptomatic with dysuria and frequency Treated with cephalexin  500 mg twice daily x 1 week States he still having symptoms Check UA, urine culture

## 2024-09-19 NOTE — Assessment & Plan Note (Signed)
 Chronic Controlled-denies any gout flares Not taking allopurinol  daily and I do not think he is taking it in a while

## 2024-09-19 NOTE — Assessment & Plan Note (Addendum)
 Chronic Following with cardiology On Eliquis  2.5 mg twice daily, pravastatin  40 mg daily

## 2024-09-19 NOTE — Assessment & Plan Note (Signed)
 Chronic Has some minimal edema which is chronic, but denies any shortness of breath more than chronic SOB with exertion Looks euvolemic Continue Lasix  20 mg daily as needed

## 2024-09-19 NOTE — Assessment & Plan Note (Signed)
 Chronic On Eliquis   2.5 mg twice daily Following with cardiology CBC, CMP

## 2024-09-19 NOTE — Assessment & Plan Note (Signed)
 Chronic Controlled Likely has some memory issues, but has not been evaluated. Continue Seroquel  25 mg nightly

## 2024-09-19 NOTE — Telephone Encounter (Signed)
 Copied from CRM #[id number]. Topic: Clinical - Prescription Issue >> Sep 19, 2024  4:20 PM Delon DASEN wrote: Reason for CRM: Diclofenac  Sodium 3 % GEL - need dosage clarificationGLENWOOD Mays with [phone]

## 2024-09-19 NOTE — Assessment & Plan Note (Signed)
 Chronic Severe in nature On Eliquis , but also taking Advil or Aleve as needed which she should not be doing Voltaren  gel-diclofenac  gel 1% not effective Start Voltaren  gel 3% twice daily as needed-hopefully this will be effective and he can discontinue the oral NSAIDs

## 2024-09-19 NOTE — Assessment & Plan Note (Signed)
 Chronic CBC, CMP Continue Lasix  20 mg as needed only

## 2024-09-19 NOTE — Assessment & Plan Note (Signed)
 Chronic Check lipid panel, CMP Continue pravastatin  40 mg daily

## 2024-09-22 MED ORDER — DICLOFENAC SODIUM 3 % EX GEL: 0.5000 g | Freq: Two times a day (BID) | CUTANEOUS | 5 refills | Status: AC | PRN

## 2024-09-22 NOTE — Telephone Encounter (Signed)
 apply 0.5 g-updated [facility]

## 2024-09-22 NOTE — Addendum Note (Signed)
 Addended by: GEOFM GLADE PARAS on: 09/22/2024 04:44 PM   Modules accepted: Orders

## 2024-09-23 ENCOUNTER — Other Ambulatory Visit: Payer: Self-pay | Admitting: Internal Medicine

## 2024-09-23 ENCOUNTER — Ambulatory Visit: Payer: Self-pay

## 2024-09-23 ENCOUNTER — Telehealth: Payer: Self-pay | Admitting: Internal Medicine

## 2024-09-23 ENCOUNTER — Ambulatory Visit: Payer: Self-pay | Admitting: Internal Medicine

## 2024-09-23 ENCOUNTER — Telehealth: Payer: Self-pay

## 2024-09-23 LAB — URINE CULTURE

## 2024-09-23 MED ORDER — NITROFURANTOIN MONOHYD MACRO 100 MG PO CAPS: 100.0000 mg | ORAL_CAPSULE | Freq: Two times a day (BID) | ORAL | 0 refills | Status: DC

## 2024-09-23 MED ORDER — NITROFURANTOIN MONOHYD MACRO 100 MG PO CAPS: 100.0000 mg | ORAL_CAPSULE | Freq: Two times a day (BID) | ORAL | 0 refills | Status: AC

## 2024-09-23 NOTE — Telephone Encounter (Unsigned)
 Copied from CRM #[id number]. Topic: Clinical - Prescription Issue >> Sep 23, 2024  5:48 PM Ashley R wrote: Reason for CRM: Diclofenac  Sodium 3 % GEL - no indication for this outside of  1% typical prescribed for joint pain.   Lacinda Holts [phone]

## 2024-09-23 NOTE — Telephone Encounter (Signed)
 Copied from CRM [id number]. Topic: Clinical - Prescription Issue >> Sep 23, 2024 10:02 AM Willma SAUNDERS wrote: Reason for CRM: Patients prescription for nitrofurantoin , macrocrystal-monohydrate, (MACROBID ) 100 MG capsule was sent to the incorrect pharmacy, needs to be sent to: [facility] [place] ,  - [address] [place]  [place] [address] Phone: [phone] Fax: [phone]  Patients son Keefe can be reached at [phone]

## 2024-09-23 NOTE — Telephone Encounter (Signed)
 Rx sent.

## 2024-09-23 NOTE — Telephone Encounter (Signed)
 This RN attempted to call on call provider, Dr. Lavern Heck, with no answer. A voicemail was left with call back number provided.   Copied from CRM #[id number]. Topic: Clinical - Prescription Issue >> Sep 23, 2024  4:41 PM Rea ORN wrote: Reason for CRM: [facility] is on the line attempting to get clarification for a pt rx that was ordered on the 15th. They are asking to speak to someone today because they want to correct this before the weekend. The dose of diclofenac  that was ordered is 3% which is typically given for pt with cancerous lesions. The pt was seen 12/15 and PCP switched from 1% to 3% since his osteoarthritis in hands is listed as severe in nature.

## 2024-09-23 NOTE — Telephone Encounter (Signed)
 [facility] [place] [facility] [place] - [address], [place]  [place] [address] Phone: [phone]  Fax: [phone]    Copied from CRM [id number]. Topic: Clinical - Prescription Issue >> Sep 23, 2024  4:41 PM Rea ORN wrote: Reason for CRM: [facility] is on the line attempting to get clarification for a pt rx that was ordered on the 15th. They are asking to speak to someone today because they want to correct this before the weekend. The dose of diclofenac  that was ordered is 3% which is typically given for pt with cancerous lesions. The pt was seen 12/15 and PCP switched from 1% to 3% since his osteoarthritis in hands is listed as severe in nature.

## 2024-09-26 ENCOUNTER — Other Ambulatory Visit: Payer: Self-pay | Admitting: Family

## 2024-09-26 ENCOUNTER — Ambulatory Visit: Payer: Self-pay

## 2024-09-26 ENCOUNTER — Other Ambulatory Visit: Payer: Self-pay

## 2024-09-26 MED ORDER — DICLOFENAC SODIUM 1 % EX GEL: 2.0000 g | Freq: Four times a day (QID) | CUTANEOUS | 2 refills | Status: AC

## 2024-09-26 MED ORDER — DICLOFENAC SODIUM 1 % EX GEL: 2.0000 g | Freq: Four times a day (QID) | CUTANEOUS | 2 refills | Status: DC

## 2024-09-26 NOTE — Telephone Encounter (Signed)
 Left message for Paul Mays to return call to clinic today.

## 2024-09-26 NOTE — Telephone Encounter (Signed)
 FYI Only or Action Required?: Action required by provider: clinical question for provider.  Patient was last seen in primary care on 09/19/2024 by Geofm Glade PARAS, MD.  Called Nurse Triage reporting Advice Only.  Triage Disposition: Call PCP When Office is Open  Patient/caregiver understands and will follow disposition?: No, wishes to speak with PCP   Copied from CRM #[id number]. Topic: Clinical - Medication Question >> Sep 26, 2024 10:48 AM Paul Mays wrote: Reason for CRM: Medication clarification Diclofenac  Sodium 3 % GEL    Reason for Disposition  [1] Caller requesting NON-URGENT health information AND [2] PCP's office is the best resource    Routing to PCP office as HP  Answer Assessment - Initial Assessment Questions 1. REASON FOR CALL: What is the main reason for your call? or How can I best help you?    Paul Mays called stating 3% diclofenac  sodium gel written for pt related severe osteoarthritis - stated osteoarthritis does not meet qualifications for 3% therefore would like to know if provider would like to change to different dosage for patient.  Please call with medication clarification ask for Pharmacist Cindy at [facility] [phone] .  Protocols used: Information Only Call - No Triage-A-AH

## 2024-09-26 NOTE — Telephone Encounter (Signed)
 This has been taken care of.

## 2024-10-17 ENCOUNTER — Encounter: Payer: Self-pay | Admitting: Internal Medicine

## 2024-10-17 NOTE — Progress Notes (Unsigned)
 "   Subjective:    Patient ID: Paul Mays, male    DOB: 05-Jan-1928, 89 y.o.   MRN: [id number]      HPI Paul Mays is here for No chief complaint on file.        Medications and allergies reviewed with patient and updated if appropriate.  Medications Ordered Prior to Encounter[1]  Review of Systems     Objective:  There were no vitals filed for this visit. BP Readings from Last 3 Encounters:  09/19/24 130/78  03/29/24 128/82  03/23/24 136/68   Wt Readings from Last 3 Encounters:  09/19/24 123 lb (55.8 kg)  03/29/24 122 lb (55.3 kg)  03/23/24 123 lb (55.8 kg)   There is no height or weight on file to calculate BMI.    Physical Exam Constitutional:      General: He is not in acute distress.    Appearance: Normal appearance. He is not ill-appearing.  HENT:     Head: Normocephalic.     Right Ear: Tympanic membrane, ear canal and external ear normal. There is no impacted cerumen.     Left Ear: Tympanic membrane, ear canal and external ear normal. There is no impacted cerumen.     Mouth/Throat:     Mouth: Mucous membranes are moist.     Pharynx: No oropharyngeal exudate or posterior oropharyngeal erythema.  Eyes:     Conjunctiva/sclera: Conjunctivae normal.  Cardiovascular:     Rate and Rhythm: Normal rate and regular rhythm.  Pulmonary:     Effort: Pulmonary effort is normal. No respiratory distress.     Breath sounds: Normal breath sounds. No wheezing or rales.  Musculoskeletal:     Cervical back: Neck supple. No tenderness.  Lymphadenopathy:     Cervical: No cervical adenopathy.  Skin:    General: Skin is warm and dry.     Findings: No rash.  Neurological:     Mental Status: He is alert.            Assessment & Plan:    See Problem List for Assessment and Plan of chronic medical problems.         [1]  Current Outpatient Medications on File Prior to Visit  Medication Sig Dispense Refill   acetaminophen  (TYLENOL ) 325 MG tablet GIVE 2  TABS(650MG ) BY MOUTH HS.  AND EVERY 6 HOURS AS NEEDED FOR PAIN OR FEVER ( MAX 3000 MG DAILY 240 tablet 11   allopurinol  (ZYLOPRIM ) 300 MG tablet TAKE 1 TABLET(300 MG) BY MOUTH DAILY AS NEEDED FOR GOUT FLARE 90 tablet 0   amoxicillin  (AMOXIL ) 500 MG tablet GIVE 4 TABS (2000MG ) BY MOUTH 1H BEFORE APPT 4 tablet 2   ANTI-DIARRHEAL 2 MG tablet GIVE 1 TAB BY MOUTH 4 TIMES DAILY AS NEEDED FOR DIARRHEA OR LOOSE STOOLS 30 tablet 11   Bismuth Subsalicylate (PEPTO BISMOL PO) Take by mouth as needed. Prn as needed for upset stomach     Cholecalciferol  (VITAMIN D3) 50 MCG (2000 UT) capsule Take 1 capsule (2,000 Units total) by mouth daily. 30 capsule 11   diclofenac  Sodium (VOLTAREN ) 1 % GEL Apply 2 g topically 4 (four) times daily. 100 g 2   Diclofenac  Sodium 3 % GEL Apply 0.5 g topically 2 (two) times daily as needed (severe OA). 100 g 5   docusate sodium  (COLACE) 100 MG capsule Take 1 capsule (100 mg total) by mouth daily. 90 capsule 3   ELIQUIS  2.5 MG TABS tablet GIVE 1 TAB BY MOUTH TWICE  DAILY 60 tablet 11   furosemide  (LASIX ) 20 MG tablet Take 1 tablet (20 mg total) by mouth daily as needed for fluid. 90 tablet 3   iron  polysaccharides (NIFEREX) 150 MG capsule TAKE 1 CAP CAPSULE (150 MG TOTAL) BY MOUTH DAILY. 30 capsule 1   nitrofurantoin , macrocrystal-monohydrate, (MACROBID ) 100 MG capsule Take 1 capsule (100 mg total) by mouth 2 (two) times daily. 14 capsule 0   oxybutynin  (DITROPAN ) 5 MG tablet Take 2 tablets (10 mg total) by mouth every 8 (eight) hours as needed for bladder spasms. 20 tablet 0   potassium chloride  (KLOR-CON  M) 10 MEQ tablet Take 1 tablet (10 mEq total) by mouth daily as needed (ONLY WHEN TAKING FUROSEMIDE ). Take 1 tablet by mouth daily (with Furosemide ) 90 tablet 3   pravastatin  (PRAVACHOL ) 40 MG tablet GIVE 1 TAB BY MOUTH ONCE DAILY 30 tablet 5   QUEtiapine  (SEROQUEL ) 25 MG tablet GIVE 1 TAB BY MOUTH AT BEDTIME 30 tablet 3   vitamin B-12 (CYANOCOBALAMIN ) 500 MCG tablet Take 1 tablet  (500 mcg total) by mouth daily. 30 tablet 11   No current facility-administered medications on file prior to visit.   "

## 2024-10-17 NOTE — Patient Instructions (Addendum)
" ° ° ° ° ° ° °  Medications changes include :   None      Return if symptoms worsen or fail to improve.  "

## 2024-10-18 ENCOUNTER — Ambulatory Visit (INDEPENDENT_AMBULATORY_CARE_PROVIDER_SITE_OTHER)

## 2024-10-18 ENCOUNTER — Ambulatory Visit: Payer: Self-pay | Admitting: Internal Medicine

## 2024-10-18 ENCOUNTER — Ambulatory Visit: Admitting: Internal Medicine

## 2024-10-18 VITALS — BP 132/70 | HR 71 | Temp 98.4°F | Ht 66.0 in | Wt 119.0 lb

## 2024-10-18 DIAGNOSIS — R0989 Other specified symptoms and signs involving the circulatory and respiratory systems: Secondary | ICD-10-CM | POA: Diagnosis not present

## 2024-10-18 DIAGNOSIS — J22 Unspecified acute lower respiratory infection: Secondary | ICD-10-CM | POA: Diagnosis not present

## 2024-10-18 MED ORDER — AMOXICILLIN-POT CLAVULANATE 875-125 MG PO TABS: 1.0000 | ORAL_TABLET | Freq: Two times a day (BID) | ORAL | 0 refills | Status: AC

## 2024-10-18 MED ORDER — IPRATROPIUM-ALBUTEROL 0.5-2.5 (3) MG/3ML IN SOLN
3.0000 mL | Freq: Once | RESPIRATORY_TRACT | Status: AC
  Administered 2024-10-18: 3 mL via RESPIRATORY_TRACT

## 2024-10-18 NOTE — Addendum Note (Signed)
 Addended by: CLAUDENE TOBIAS PARAS on: 10/18/2024 02:55 PM   Modules accepted: Orders

## 2024-10-20 ENCOUNTER — Telehealth: Payer: Self-pay

## 2024-10-20 MED ORDER — PREDNISONE 20 MG PO TABS: 20.0000 mg | ORAL_TABLET | Freq: Every day | ORAL | 0 refills | Status: AC

## 2024-10-20 NOTE — Telephone Encounter (Signed)
 Copied from CRM #[id number]. Topic: Clinical - Medication Question >> Oct 20, 2024 11:41 AM [name] wrote: Pt son calling to inform the provider that the pt is not getting better he's about the same with the antibiotics. Would like to know if the steroids can be sent in to [facility] [id number] [place], [place]ays - [address] [place] [address] Phone: [phone] Fax: [phone] Hours: Not open 24 hours

## 2024-10-20 NOTE — Telephone Encounter (Signed)
 Prescription sent in.

## 2024-10-20 NOTE — Addendum Note (Signed)
 Addended by: GEOFM GLADE PARAS on: 10/20/2024 12:20 PM   Modules accepted: Orders

## 2024-11-02 ENCOUNTER — Other Ambulatory Visit: Payer: Self-pay | Admitting: Internal Medicine

## 2025-03-20 ENCOUNTER — Ambulatory Visit: Admitting: Internal Medicine

## 2025-03-23 ENCOUNTER — Ambulatory Visit
